# Patient Record
Sex: Female | Born: 1960
Health system: Southern US, Community
[De-identification: ages and names within clinical notes are randomized; demographics above are authoritative.]

## PROBLEM LIST (undated history)

## (undated) DIAGNOSIS — K760 Fatty (change of) liver, not elsewhere classified: Secondary | ICD-10-CM

## (undated) DIAGNOSIS — M25512 Pain in left shoulder: Secondary | ICD-10-CM

## (undated) DIAGNOSIS — E119 Type 2 diabetes mellitus without complications: Secondary | ICD-10-CM

## (undated) DIAGNOSIS — M1990 Unspecified osteoarthritis, unspecified site: Secondary | ICD-10-CM

## (undated) DIAGNOSIS — I10 Essential (primary) hypertension: Secondary | ICD-10-CM

## (undated) DIAGNOSIS — K219 Gastro-esophageal reflux disease without esophagitis: Secondary | ICD-10-CM

## (undated) DIAGNOSIS — M549 Dorsalgia, unspecified: Secondary | ICD-10-CM

## (undated) DIAGNOSIS — R011 Cardiac murmur, unspecified: Secondary | ICD-10-CM

## (undated) DIAGNOSIS — IMO0001 Reserved for inherently not codable concepts without codable children: Secondary | ICD-10-CM

## (undated) DIAGNOSIS — T7840XA Allergy, unspecified, initial encounter: Secondary | ICD-10-CM

## (undated) DIAGNOSIS — R51 Headache: Secondary | ICD-10-CM

## (undated) DIAGNOSIS — F419 Anxiety disorder, unspecified: Secondary | ICD-10-CM

## (undated) DIAGNOSIS — I499 Cardiac arrhythmia, unspecified: Secondary | ICD-10-CM

## (undated) DIAGNOSIS — M546 Pain in thoracic spine: Secondary | ICD-10-CM

## (undated) DIAGNOSIS — F329 Major depressive disorder, single episode, unspecified: Secondary | ICD-10-CM

## (undated) DIAGNOSIS — R000 Tachycardia, unspecified: Secondary | ICD-10-CM

## (undated) DIAGNOSIS — R519 Headache, unspecified: Secondary | ICD-10-CM

## (undated) DIAGNOSIS — D649 Anemia, unspecified: Secondary | ICD-10-CM

## (undated) DIAGNOSIS — F32A Depression, unspecified: Secondary | ICD-10-CM

## (undated) DIAGNOSIS — I4891 Unspecified atrial fibrillation: Secondary | ICD-10-CM

## (undated) HISTORY — DX: Pain in left shoulder: M25.512

## (undated) HISTORY — PX: TUBAL LIGATION: SHX77

## (undated) HISTORY — DX: Anemia, unspecified: D64.9

## (undated) HISTORY — DX: Allergy, unspecified, initial encounter: T78.40XA

## (undated) HISTORY — PX: ANKLE SURGERY: SHX546

## (undated) HISTORY — DX: Cardiac murmur, unspecified: R01.1

## (undated) HISTORY — PX: KNEE SURGERY: SHX244

## (undated) HISTORY — DX: Pain in thoracic spine: M54.6

---

## 1997-06-28 ENCOUNTER — Ambulatory Visit (HOSPITAL_COMMUNITY): Admission: RE | Admit: 1997-06-28 | Discharge: 1997-06-28 | Payer: Self-pay | Admitting: Obstetrics & Gynecology

## 1997-07-23 ENCOUNTER — Ambulatory Visit (HOSPITAL_COMMUNITY): Admission: RE | Admit: 1997-07-23 | Discharge: 1997-07-23 | Payer: Self-pay | Admitting: *Deleted

## 1997-08-18 ENCOUNTER — Inpatient Hospital Stay (HOSPITAL_COMMUNITY): Admission: AD | Admit: 1997-08-18 | Discharge: 1997-08-18 | Payer: Self-pay

## 1997-09-17 ENCOUNTER — Inpatient Hospital Stay (HOSPITAL_COMMUNITY): Admission: AD | Admit: 1997-09-17 | Discharge: 1997-09-17 | Payer: Self-pay | Admitting: *Deleted

## 1997-09-24 ENCOUNTER — Encounter: Admission: RE | Admit: 1997-09-24 | Discharge: 1997-12-23 | Payer: Self-pay

## 1997-09-26 ENCOUNTER — Inpatient Hospital Stay (HOSPITAL_COMMUNITY): Admission: AD | Admit: 1997-09-26 | Discharge: 1997-09-26 | Payer: Self-pay | Admitting: Obstetrics & Gynecology

## 1997-12-21 ENCOUNTER — Inpatient Hospital Stay (HOSPITAL_COMMUNITY): Admission: AD | Admit: 1997-12-21 | Discharge: 1997-12-23 | Payer: Self-pay | Admitting: *Deleted

## 1997-12-27 ENCOUNTER — Inpatient Hospital Stay (HOSPITAL_COMMUNITY): Admission: AD | Admit: 1997-12-27 | Discharge: 1997-12-27 | Payer: Self-pay | Admitting: Obstetrics

## 1998-01-28 ENCOUNTER — Other Ambulatory Visit: Admission: RE | Admit: 1998-01-28 | Discharge: 1998-01-28 | Payer: Self-pay

## 1998-01-28 ENCOUNTER — Encounter: Admission: RE | Admit: 1998-01-28 | Discharge: 1998-01-28 | Payer: Self-pay | Admitting: Family Medicine

## 1998-02-06 ENCOUNTER — Encounter: Admission: RE | Admit: 1998-02-06 | Discharge: 1998-02-06 | Payer: Self-pay | Admitting: Family Medicine

## 1998-02-13 ENCOUNTER — Encounter: Admission: RE | Admit: 1998-02-13 | Discharge: 1998-02-13 | Payer: Self-pay | Admitting: Family Medicine

## 1998-05-07 ENCOUNTER — Encounter: Admission: RE | Admit: 1998-05-07 | Discharge: 1998-05-07 | Payer: Self-pay | Admitting: Family Medicine

## 1998-06-10 ENCOUNTER — Encounter: Admission: RE | Admit: 1998-06-10 | Discharge: 1998-06-10 | Payer: Self-pay | Admitting: Sports Medicine

## 1998-06-26 ENCOUNTER — Encounter: Admission: RE | Admit: 1998-06-26 | Discharge: 1998-06-26 | Payer: Self-pay | Admitting: Family Medicine

## 1998-07-09 ENCOUNTER — Encounter: Admission: RE | Admit: 1998-07-09 | Discharge: 1998-07-09 | Payer: Self-pay | Admitting: Family Medicine

## 1998-07-17 ENCOUNTER — Encounter: Admission: RE | Admit: 1998-07-17 | Discharge: 1998-07-17 | Payer: Self-pay | Admitting: Family Medicine

## 1998-07-18 ENCOUNTER — Encounter: Admission: RE | Admit: 1998-07-18 | Discharge: 1998-07-18 | Payer: Self-pay | Admitting: Family Medicine

## 1998-07-31 ENCOUNTER — Encounter: Admission: RE | Admit: 1998-07-31 | Discharge: 1998-07-31 | Payer: Self-pay | Admitting: Family Medicine

## 1998-08-06 ENCOUNTER — Encounter: Admission: RE | Admit: 1998-08-06 | Discharge: 1998-08-06 | Payer: Self-pay | Admitting: Family Medicine

## 1998-08-14 ENCOUNTER — Encounter: Admission: RE | Admit: 1998-08-14 | Discharge: 1998-08-14 | Payer: Self-pay | Admitting: Family Medicine

## 1998-08-20 ENCOUNTER — Encounter: Admission: RE | Admit: 1998-08-20 | Discharge: 1998-08-20 | Payer: Self-pay | Admitting: Family Medicine

## 1998-09-09 ENCOUNTER — Encounter: Admission: RE | Admit: 1998-09-09 | Discharge: 1998-09-09 | Payer: Self-pay | Admitting: Sports Medicine

## 1998-09-18 ENCOUNTER — Encounter: Admission: RE | Admit: 1998-09-18 | Discharge: 1998-09-18 | Payer: Self-pay | Admitting: Family Medicine

## 1998-09-25 ENCOUNTER — Encounter: Admission: RE | Admit: 1998-09-25 | Discharge: 1998-09-25 | Payer: Self-pay | Admitting: Family Medicine

## 1998-09-26 ENCOUNTER — Encounter: Admission: RE | Admit: 1998-09-26 | Discharge: 1998-09-26 | Payer: Self-pay | Admitting: Family Medicine

## 1998-10-06 ENCOUNTER — Emergency Department (HOSPITAL_COMMUNITY): Admission: EM | Admit: 1998-10-06 | Discharge: 1998-10-06 | Payer: Self-pay | Admitting: Emergency Medicine

## 1998-10-08 ENCOUNTER — Encounter: Admission: RE | Admit: 1998-10-08 | Discharge: 1998-10-08 | Payer: Self-pay | Admitting: Family Medicine

## 1998-10-14 ENCOUNTER — Encounter: Admission: RE | Admit: 1998-10-14 | Discharge: 1998-10-14 | Payer: Self-pay | Admitting: Family Medicine

## 1998-10-16 ENCOUNTER — Encounter: Admission: RE | Admit: 1998-10-16 | Discharge: 1998-10-16 | Payer: Self-pay | Admitting: Sports Medicine

## 1998-10-22 ENCOUNTER — Encounter: Admission: RE | Admit: 1998-10-22 | Discharge: 1998-10-22 | Payer: Self-pay | Admitting: Family Medicine

## 1998-10-30 ENCOUNTER — Encounter: Admission: RE | Admit: 1998-10-30 | Discharge: 1998-10-30 | Payer: Self-pay | Admitting: Family Medicine

## 1998-11-05 ENCOUNTER — Encounter: Admission: RE | Admit: 1998-11-05 | Discharge: 1998-11-05 | Payer: Self-pay | Admitting: Family Medicine

## 1998-11-11 ENCOUNTER — Encounter: Admission: RE | Admit: 1998-11-11 | Discharge: 1998-11-11 | Payer: Self-pay | Admitting: Sports Medicine

## 1998-11-24 ENCOUNTER — Encounter: Admission: RE | Admit: 1998-11-24 | Discharge: 1998-11-24 | Payer: Self-pay | Admitting: Family Medicine

## 1998-11-28 ENCOUNTER — Encounter: Admission: RE | Admit: 1998-11-28 | Discharge: 1998-11-28 | Payer: Self-pay | Admitting: Family Medicine

## 1998-12-01 ENCOUNTER — Encounter: Admission: RE | Admit: 1998-12-01 | Discharge: 1998-12-01 | Payer: Self-pay | Admitting: Family Medicine

## 1999-01-05 ENCOUNTER — Encounter: Admission: RE | Admit: 1999-01-05 | Discharge: 1999-01-05 | Payer: Self-pay | Admitting: Family Medicine

## 1999-02-13 ENCOUNTER — Encounter: Payer: Self-pay | Admitting: *Deleted

## 1999-02-13 ENCOUNTER — Encounter: Admission: RE | Admit: 1999-02-13 | Discharge: 1999-02-13 | Payer: Self-pay | Admitting: *Deleted

## 1999-02-13 ENCOUNTER — Encounter: Admission: RE | Admit: 1999-02-13 | Discharge: 1999-02-13 | Payer: Self-pay | Admitting: Sports Medicine

## 1999-02-18 ENCOUNTER — Encounter: Admission: RE | Admit: 1999-02-18 | Discharge: 1999-02-18 | Payer: Self-pay | Admitting: Family Medicine

## 1999-02-19 ENCOUNTER — Encounter: Admission: RE | Admit: 1999-02-19 | Discharge: 1999-05-20 | Payer: Self-pay | Admitting: Orthopedic Surgery

## 1999-03-10 ENCOUNTER — Emergency Department (HOSPITAL_COMMUNITY): Admission: EM | Admit: 1999-03-10 | Discharge: 1999-03-10 | Payer: Self-pay | Admitting: Emergency Medicine

## 1999-03-24 ENCOUNTER — Encounter: Admission: RE | Admit: 1999-03-24 | Discharge: 1999-03-24 | Payer: Self-pay | Admitting: Family Medicine

## 1999-04-07 ENCOUNTER — Encounter: Admission: RE | Admit: 1999-04-07 | Discharge: 1999-04-07 | Payer: Self-pay | Admitting: Sports Medicine

## 1999-04-21 ENCOUNTER — Encounter: Admission: RE | Admit: 1999-04-21 | Discharge: 1999-04-21 | Payer: Self-pay | Admitting: Family Medicine

## 1999-04-24 ENCOUNTER — Encounter: Admission: RE | Admit: 1999-04-24 | Discharge: 1999-04-24 | Payer: Self-pay | Admitting: Family Medicine

## 1999-05-05 ENCOUNTER — Encounter: Admission: RE | Admit: 1999-05-05 | Discharge: 1999-05-05 | Payer: Self-pay | Admitting: Sports Medicine

## 1999-05-15 ENCOUNTER — Ambulatory Visit (HOSPITAL_COMMUNITY): Admission: RE | Admit: 1999-05-15 | Discharge: 1999-05-15 | Payer: Self-pay | Admitting: Family Medicine

## 1999-05-15 ENCOUNTER — Encounter: Admission: RE | Admit: 1999-05-15 | Discharge: 1999-05-15 | Payer: Self-pay | Admitting: Family Medicine

## 1999-06-09 ENCOUNTER — Encounter: Admission: RE | Admit: 1999-06-09 | Discharge: 1999-06-09 | Payer: Self-pay | Admitting: Family Medicine

## 1999-06-09 ENCOUNTER — Encounter: Payer: Self-pay | Admitting: Family Medicine

## 1999-06-09 ENCOUNTER — Ambulatory Visit (HOSPITAL_COMMUNITY): Admission: RE | Admit: 1999-06-09 | Discharge: 1999-06-09 | Payer: Self-pay | Admitting: Family Medicine

## 1999-06-24 ENCOUNTER — Encounter: Admission: RE | Admit: 1999-06-24 | Discharge: 1999-06-24 | Payer: Self-pay | Admitting: Family Medicine

## 1999-07-30 ENCOUNTER — Encounter: Admission: RE | Admit: 1999-07-30 | Discharge: 1999-07-30 | Payer: Self-pay | Admitting: Family Medicine

## 1999-09-15 ENCOUNTER — Encounter: Payer: Self-pay | Admitting: Emergency Medicine

## 1999-09-15 ENCOUNTER — Emergency Department (HOSPITAL_COMMUNITY): Admission: EM | Admit: 1999-09-15 | Discharge: 1999-09-16 | Payer: Self-pay | Admitting: Emergency Medicine

## 1999-09-16 ENCOUNTER — Encounter: Payer: Self-pay | Admitting: Cardiovascular Disease

## 1999-09-16 ENCOUNTER — Ambulatory Visit (HOSPITAL_COMMUNITY): Admission: RE | Admit: 1999-09-16 | Discharge: 1999-09-16 | Payer: Self-pay | Admitting: Cardiovascular Disease

## 1999-11-05 ENCOUNTER — Encounter: Admission: RE | Admit: 1999-11-05 | Discharge: 1999-11-05 | Payer: Self-pay | Admitting: Family Medicine

## 2000-02-26 ENCOUNTER — Encounter: Admission: RE | Admit: 2000-02-26 | Discharge: 2000-02-26 | Payer: Self-pay | Admitting: Family Medicine

## 2000-03-30 ENCOUNTER — Encounter: Admission: RE | Admit: 2000-03-30 | Discharge: 2000-03-30 | Payer: Self-pay | Admitting: Family Medicine

## 2000-04-08 ENCOUNTER — Encounter: Admission: RE | Admit: 2000-04-08 | Discharge: 2000-04-08 | Payer: Self-pay | Admitting: Family Medicine

## 2000-05-18 ENCOUNTER — Encounter: Admission: RE | Admit: 2000-05-18 | Discharge: 2000-05-18 | Payer: Self-pay | Admitting: Family Medicine

## 2000-05-19 ENCOUNTER — Encounter: Payer: Self-pay | Admitting: Sports Medicine

## 2000-05-19 ENCOUNTER — Encounter: Payer: Self-pay | Admitting: General Surgery

## 2000-05-19 ENCOUNTER — Ambulatory Visit (HOSPITAL_COMMUNITY): Admission: RE | Admit: 2000-05-19 | Discharge: 2000-05-19 | Payer: Self-pay | Admitting: General Surgery

## 2000-05-19 ENCOUNTER — Encounter: Admission: RE | Admit: 2000-05-19 | Discharge: 2000-05-19 | Payer: Self-pay | Admitting: Sports Medicine

## 2000-06-02 ENCOUNTER — Encounter: Admission: RE | Admit: 2000-06-02 | Discharge: 2000-06-02 | Payer: Self-pay | Admitting: Family Medicine

## 2000-06-28 ENCOUNTER — Encounter: Payer: Self-pay | Admitting: Sports Medicine

## 2000-06-28 ENCOUNTER — Encounter: Admission: RE | Admit: 2000-06-28 | Discharge: 2000-06-28 | Payer: Self-pay | Admitting: Sports Medicine

## 2000-06-28 ENCOUNTER — Encounter: Admission: RE | Admit: 2000-06-28 | Discharge: 2000-09-26 | Payer: Self-pay | Admitting: *Deleted

## 2000-08-10 ENCOUNTER — Encounter: Admission: RE | Admit: 2000-08-10 | Discharge: 2000-08-10 | Payer: Self-pay | Admitting: Family Medicine

## 2000-08-21 ENCOUNTER — Emergency Department (HOSPITAL_COMMUNITY): Admission: EM | Admit: 2000-08-21 | Discharge: 2000-08-21 | Payer: Self-pay | Admitting: Emergency Medicine

## 2000-08-25 ENCOUNTER — Encounter: Admission: RE | Admit: 2000-08-25 | Discharge: 2000-08-25 | Payer: Self-pay | Admitting: Family Medicine

## 2000-08-31 ENCOUNTER — Encounter: Payer: Self-pay | Admitting: Sports Medicine

## 2000-08-31 ENCOUNTER — Encounter: Admission: RE | Admit: 2000-08-31 | Discharge: 2000-08-31 | Payer: Self-pay | Admitting: Family Medicine

## 2000-08-31 ENCOUNTER — Encounter: Admission: RE | Admit: 2000-08-31 | Discharge: 2000-08-31 | Payer: Self-pay | Admitting: Sports Medicine

## 2000-09-28 ENCOUNTER — Encounter: Admission: RE | Admit: 2000-09-28 | Discharge: 2000-09-28 | Payer: Self-pay | Admitting: Family Medicine

## 2000-11-01 ENCOUNTER — Encounter: Admission: RE | Admit: 2000-11-01 | Discharge: 2000-11-01 | Payer: Self-pay | Admitting: Family Medicine

## 2000-11-06 ENCOUNTER — Emergency Department (HOSPITAL_COMMUNITY): Admission: EM | Admit: 2000-11-06 | Discharge: 2000-11-07 | Payer: Self-pay | Admitting: Emergency Medicine

## 2000-11-12 ENCOUNTER — Emergency Department (HOSPITAL_COMMUNITY): Admission: EM | Admit: 2000-11-12 | Discharge: 2000-11-12 | Payer: Self-pay | Admitting: Emergency Medicine

## 2000-11-15 ENCOUNTER — Other Ambulatory Visit: Admission: RE | Admit: 2000-11-15 | Discharge: 2000-11-15 | Payer: Self-pay | Admitting: Family Medicine

## 2000-11-15 ENCOUNTER — Encounter: Admission: RE | Admit: 2000-11-15 | Discharge: 2000-11-15 | Payer: Self-pay | Admitting: Family Medicine

## 2000-11-24 ENCOUNTER — Ambulatory Visit (HOSPITAL_COMMUNITY): Admission: RE | Admit: 2000-11-24 | Discharge: 2000-11-24 | Payer: Self-pay | Admitting: Gastroenterology

## 2000-11-24 ENCOUNTER — Encounter: Payer: Self-pay | Admitting: Gastroenterology

## 2000-12-15 ENCOUNTER — Encounter: Admission: RE | Admit: 2000-12-15 | Discharge: 2000-12-15 | Payer: Self-pay | Admitting: Family Medicine

## 2000-12-30 ENCOUNTER — Encounter: Admission: RE | Admit: 2000-12-30 | Discharge: 2000-12-30 | Payer: Self-pay | Admitting: Family Medicine

## 2001-02-01 ENCOUNTER — Encounter: Payer: Self-pay | Admitting: Sports Medicine

## 2001-02-01 ENCOUNTER — Encounter: Admission: RE | Admit: 2001-02-01 | Discharge: 2001-02-01 | Payer: Self-pay | Admitting: Sports Medicine

## 2001-03-14 ENCOUNTER — Encounter: Admission: RE | Admit: 2001-03-14 | Discharge: 2001-03-14 | Payer: Self-pay | Admitting: Family Medicine

## 2001-03-27 ENCOUNTER — Encounter: Admission: RE | Admit: 2001-03-27 | Discharge: 2001-03-27 | Payer: Self-pay | Admitting: Family Medicine

## 2001-03-30 ENCOUNTER — Encounter: Admission: RE | Admit: 2001-03-30 | Discharge: 2001-03-30 | Payer: Self-pay | Admitting: Family Medicine

## 2001-04-04 ENCOUNTER — Encounter: Admission: RE | Admit: 2001-04-04 | Discharge: 2001-04-04 | Payer: Self-pay | Admitting: Family Medicine

## 2001-04-21 ENCOUNTER — Encounter: Admission: RE | Admit: 2001-04-21 | Discharge: 2001-04-21 | Payer: Self-pay | Admitting: Family Medicine

## 2001-05-04 ENCOUNTER — Encounter: Admission: RE | Admit: 2001-05-04 | Discharge: 2001-05-04 | Payer: Self-pay | Admitting: Family Medicine

## 2001-05-17 ENCOUNTER — Encounter: Admission: RE | Admit: 2001-05-17 | Discharge: 2001-05-17 | Payer: Self-pay | Admitting: *Deleted

## 2001-05-24 ENCOUNTER — Encounter: Admission: RE | Admit: 2001-05-24 | Discharge: 2001-05-24 | Payer: Self-pay | Admitting: Family Medicine

## 2001-06-01 ENCOUNTER — Encounter: Admission: RE | Admit: 2001-06-01 | Discharge: 2001-07-27 | Payer: Self-pay | Admitting: Sports Medicine

## 2001-06-12 ENCOUNTER — Encounter: Admission: RE | Admit: 2001-06-12 | Discharge: 2001-06-12 | Payer: Self-pay | Admitting: Family Medicine

## 2001-06-28 ENCOUNTER — Encounter: Admission: RE | Admit: 2001-06-28 | Discharge: 2001-06-28 | Payer: Self-pay | Admitting: Family Medicine

## 2001-07-04 ENCOUNTER — Encounter: Admission: RE | Admit: 2001-07-04 | Discharge: 2001-07-04 | Payer: Self-pay | Admitting: Family Medicine

## 2001-07-21 ENCOUNTER — Encounter: Payer: Self-pay | Admitting: Family Medicine

## 2001-07-21 ENCOUNTER — Encounter: Admission: RE | Admit: 2001-07-21 | Discharge: 2001-07-21 | Payer: Self-pay | Admitting: Family Medicine

## 2001-07-25 ENCOUNTER — Encounter: Payer: Self-pay | Admitting: *Deleted

## 2001-07-25 ENCOUNTER — Encounter: Admission: RE | Admit: 2001-07-25 | Discharge: 2001-07-25 | Payer: Self-pay | Admitting: *Deleted

## 2001-08-02 ENCOUNTER — Encounter: Admission: RE | Admit: 2001-08-02 | Discharge: 2001-08-02 | Payer: Self-pay | Admitting: Family Medicine

## 2001-08-29 ENCOUNTER — Encounter: Admission: RE | Admit: 2001-08-29 | Discharge: 2001-08-29 | Payer: Self-pay | Admitting: Sports Medicine

## 2001-10-06 ENCOUNTER — Encounter: Admission: RE | Admit: 2001-10-06 | Discharge: 2001-10-06 | Payer: Self-pay | Admitting: Family Medicine

## 2001-11-03 ENCOUNTER — Encounter: Admission: RE | Admit: 2001-11-03 | Discharge: 2001-11-03 | Payer: Self-pay | Admitting: Family Medicine

## 2001-11-09 ENCOUNTER — Encounter: Admission: RE | Admit: 2001-11-09 | Discharge: 2001-11-09 | Payer: Self-pay | Admitting: Sports Medicine

## 2001-12-05 ENCOUNTER — Encounter: Admission: RE | Admit: 2001-12-05 | Discharge: 2001-12-05 | Payer: Self-pay | Admitting: Family Medicine

## 2001-12-19 ENCOUNTER — Encounter: Admission: RE | Admit: 2001-12-19 | Discharge: 2001-12-19 | Payer: Self-pay | Admitting: Family Medicine

## 2001-12-26 ENCOUNTER — Encounter: Admission: RE | Admit: 2001-12-26 | Discharge: 2001-12-26 | Payer: Self-pay | Admitting: Family Medicine

## 2002-01-18 ENCOUNTER — Encounter: Admission: RE | Admit: 2002-01-18 | Discharge: 2002-01-18 | Payer: Self-pay | Admitting: Family Medicine

## 2002-02-08 ENCOUNTER — Encounter: Admission: RE | Admit: 2002-02-08 | Discharge: 2002-02-08 | Payer: Self-pay | Admitting: Family Medicine

## 2002-02-08 ENCOUNTER — Encounter: Payer: Self-pay | Admitting: Family Medicine

## 2002-03-09 ENCOUNTER — Encounter: Admission: RE | Admit: 2002-03-09 | Discharge: 2002-03-09 | Payer: Self-pay | Admitting: Family Medicine

## 2002-03-28 ENCOUNTER — Encounter: Admission: RE | Admit: 2002-03-28 | Discharge: 2002-03-28 | Payer: Self-pay | Admitting: Family Medicine

## 2002-03-30 ENCOUNTER — Encounter: Admission: RE | Admit: 2002-03-30 | Discharge: 2002-03-30 | Payer: Self-pay | Admitting: Sports Medicine

## 2002-05-01 ENCOUNTER — Emergency Department (HOSPITAL_COMMUNITY): Admission: EM | Admit: 2002-05-01 | Discharge: 2002-05-01 | Payer: Self-pay | Admitting: Emergency Medicine

## 2002-05-02 ENCOUNTER — Encounter: Admission: RE | Admit: 2002-05-02 | Discharge: 2002-05-02 | Payer: Self-pay | Admitting: Family Medicine

## 2002-05-03 ENCOUNTER — Encounter: Admission: RE | Admit: 2002-05-03 | Discharge: 2002-05-03 | Payer: Self-pay | Admitting: Family Medicine

## 2002-05-11 ENCOUNTER — Encounter: Admission: RE | Admit: 2002-05-11 | Discharge: 2002-08-09 | Payer: Self-pay | Admitting: Sports Medicine

## 2002-05-11 ENCOUNTER — Encounter: Admission: RE | Admit: 2002-05-11 | Discharge: 2002-05-11 | Payer: Self-pay | Admitting: Sports Medicine

## 2002-05-17 ENCOUNTER — Encounter: Admission: RE | Admit: 2002-05-17 | Discharge: 2002-05-17 | Payer: Self-pay | Admitting: Family Medicine

## 2002-05-24 ENCOUNTER — Encounter: Admission: RE | Admit: 2002-05-24 | Discharge: 2002-05-24 | Payer: Self-pay | Admitting: Family Medicine

## 2002-05-31 ENCOUNTER — Encounter: Admission: RE | Admit: 2002-05-31 | Discharge: 2002-05-31 | Payer: Self-pay | Admitting: Family Medicine

## 2002-06-28 ENCOUNTER — Encounter: Admission: RE | Admit: 2002-06-28 | Discharge: 2002-06-28 | Payer: Self-pay | Admitting: Family Medicine

## 2002-06-29 ENCOUNTER — Encounter: Admission: RE | Admit: 2002-06-29 | Discharge: 2002-06-29 | Payer: Self-pay | Admitting: Family Medicine

## 2002-07-13 ENCOUNTER — Encounter: Admission: RE | Admit: 2002-07-13 | Discharge: 2002-07-13 | Payer: Self-pay | Admitting: Family Medicine

## 2002-07-20 ENCOUNTER — Encounter: Admission: RE | Admit: 2002-07-20 | Discharge: 2002-07-20 | Payer: Self-pay | Admitting: Family Medicine

## 2002-07-26 ENCOUNTER — Encounter: Payer: Self-pay | Admitting: Gastroenterology

## 2002-07-26 ENCOUNTER — Ambulatory Visit (HOSPITAL_COMMUNITY): Admission: RE | Admit: 2002-07-26 | Discharge: 2002-07-26 | Payer: Self-pay | Admitting: Gastroenterology

## 2002-08-06 ENCOUNTER — Encounter: Admission: RE | Admit: 2002-08-06 | Discharge: 2002-08-06 | Payer: Self-pay | Admitting: Family Medicine

## 2002-08-09 ENCOUNTER — Ambulatory Visit (HOSPITAL_COMMUNITY): Admission: RE | Admit: 2002-08-09 | Discharge: 2002-08-09 | Payer: Self-pay | Admitting: Gastroenterology

## 2002-08-09 ENCOUNTER — Encounter: Payer: Self-pay | Admitting: Gastroenterology

## 2002-08-23 ENCOUNTER — Encounter: Admission: RE | Admit: 2002-08-23 | Discharge: 2002-08-23 | Payer: Self-pay | Admitting: Family Medicine

## 2002-08-25 ENCOUNTER — Emergency Department (HOSPITAL_COMMUNITY): Admission: EM | Admit: 2002-08-25 | Discharge: 2002-08-25 | Payer: Self-pay | Admitting: Emergency Medicine

## 2002-08-27 ENCOUNTER — Encounter: Admission: RE | Admit: 2002-08-27 | Discharge: 2002-08-27 | Payer: Self-pay | Admitting: Family Medicine

## 2002-09-04 ENCOUNTER — Encounter: Admission: RE | Admit: 2002-09-04 | Discharge: 2002-09-04 | Payer: Self-pay | Admitting: Family Medicine

## 2002-09-21 ENCOUNTER — Encounter: Admission: RE | Admit: 2002-09-21 | Discharge: 2002-09-21 | Payer: Self-pay | Admitting: Sports Medicine

## 2002-10-25 ENCOUNTER — Encounter: Admission: RE | Admit: 2002-10-25 | Discharge: 2002-10-25 | Payer: Self-pay | Admitting: Family Medicine

## 2002-11-08 ENCOUNTER — Encounter: Admission: RE | Admit: 2002-11-08 | Discharge: 2002-11-08 | Payer: Self-pay | Admitting: Family Medicine

## 2002-11-13 ENCOUNTER — Encounter: Admission: RE | Admit: 2002-11-13 | Discharge: 2002-11-13 | Payer: Self-pay | Admitting: Family Medicine

## 2002-11-14 ENCOUNTER — Encounter: Admission: RE | Admit: 2002-11-14 | Discharge: 2002-11-14 | Payer: Self-pay | Admitting: Family Medicine

## 2003-01-10 ENCOUNTER — Encounter: Admission: RE | Admit: 2003-01-10 | Discharge: 2003-01-10 | Payer: Self-pay | Admitting: Family Medicine

## 2003-01-17 ENCOUNTER — Encounter: Admission: RE | Admit: 2003-01-17 | Discharge: 2003-01-17 | Payer: Self-pay | Admitting: Sports Medicine

## 2003-02-01 ENCOUNTER — Encounter: Admission: RE | Admit: 2003-02-01 | Discharge: 2003-02-01 | Payer: Self-pay | Admitting: Sports Medicine

## 2003-02-01 ENCOUNTER — Other Ambulatory Visit: Admission: RE | Admit: 2003-02-01 | Discharge: 2003-02-01 | Payer: Self-pay | Admitting: Family Medicine

## 2003-03-06 ENCOUNTER — Encounter: Admission: RE | Admit: 2003-03-06 | Discharge: 2003-03-06 | Payer: Self-pay | Admitting: Sports Medicine

## 2003-04-18 ENCOUNTER — Encounter: Admission: RE | Admit: 2003-04-18 | Discharge: 2003-04-18 | Payer: Self-pay | Admitting: Family Medicine

## 2003-05-06 ENCOUNTER — Encounter: Admission: RE | Admit: 2003-05-06 | Discharge: 2003-05-06 | Payer: Self-pay | Admitting: Family Medicine

## 2003-05-08 ENCOUNTER — Ambulatory Visit (HOSPITAL_COMMUNITY): Admission: RE | Admit: 2003-05-08 | Discharge: 2003-05-08 | Payer: Self-pay | Admitting: Gastroenterology

## 2003-05-16 ENCOUNTER — Encounter: Admission: RE | Admit: 2003-05-16 | Discharge: 2003-05-16 | Payer: Self-pay | Admitting: Family Medicine

## 2003-06-13 ENCOUNTER — Encounter: Admission: RE | Admit: 2003-06-13 | Discharge: 2003-06-13 | Payer: Self-pay | Admitting: Family Medicine

## 2003-06-26 ENCOUNTER — Encounter: Admission: RE | Admit: 2003-06-26 | Discharge: 2003-06-26 | Payer: Self-pay | Admitting: Sports Medicine

## 2003-06-30 ENCOUNTER — Emergency Department (HOSPITAL_COMMUNITY): Admission: EM | Admit: 2003-06-30 | Discharge: 2003-06-30 | Payer: Self-pay | Admitting: Emergency Medicine

## 2003-09-02 ENCOUNTER — Encounter: Admission: RE | Admit: 2003-09-02 | Discharge: 2003-09-02 | Payer: Self-pay | Admitting: Family Medicine

## 2003-09-11 ENCOUNTER — Encounter: Admission: RE | Admit: 2003-09-11 | Discharge: 2003-09-11 | Payer: Self-pay | Admitting: Family Medicine

## 2003-10-29 ENCOUNTER — Encounter: Admission: RE | Admit: 2003-10-29 | Discharge: 2003-10-29 | Payer: Self-pay | Admitting: Family Medicine

## 2003-11-04 ENCOUNTER — Encounter: Admission: RE | Admit: 2003-11-04 | Discharge: 2003-11-04 | Payer: Self-pay | Admitting: Family Medicine

## 2003-11-13 ENCOUNTER — Encounter: Admission: RE | Admit: 2003-11-13 | Discharge: 2003-11-13 | Payer: Self-pay | Admitting: Sports Medicine

## 2003-11-13 ENCOUNTER — Encounter: Admission: RE | Admit: 2003-11-13 | Discharge: 2003-11-13 | Payer: Self-pay | Admitting: Family Medicine

## 2003-12-13 ENCOUNTER — Encounter: Admission: RE | Admit: 2003-12-13 | Discharge: 2003-12-13 | Payer: Self-pay | Admitting: Family Medicine

## 2003-12-31 ENCOUNTER — Ambulatory Visit: Payer: Self-pay | Admitting: Family Medicine

## 2004-01-01 ENCOUNTER — Ambulatory Visit (HOSPITAL_COMMUNITY): Admission: RE | Admit: 2004-01-01 | Discharge: 2004-01-01 | Payer: Self-pay | Admitting: Family Medicine

## 2004-01-13 ENCOUNTER — Ambulatory Visit: Payer: Self-pay | Admitting: Family Medicine

## 2004-01-29 ENCOUNTER — Ambulatory Visit: Payer: Self-pay | Admitting: Family Medicine

## 2004-01-30 ENCOUNTER — Ambulatory Visit: Payer: Self-pay | Admitting: Family Medicine

## 2004-02-05 ENCOUNTER — Encounter
Admission: RE | Admit: 2004-02-05 | Discharge: 2004-05-05 | Payer: Self-pay | Admitting: Physical Medicine & Rehabilitation

## 2004-02-21 ENCOUNTER — Ambulatory Visit: Payer: Self-pay | Admitting: Physical Medicine & Rehabilitation

## 2004-03-24 ENCOUNTER — Ambulatory Visit (HOSPITAL_COMMUNITY): Admission: RE | Admit: 2004-03-24 | Discharge: 2004-03-24 | Payer: Self-pay | Admitting: Family Medicine

## 2004-03-24 ENCOUNTER — Ambulatory Visit: Payer: Self-pay | Admitting: Family Medicine

## 2004-04-03 ENCOUNTER — Encounter: Admission: RE | Admit: 2004-04-03 | Discharge: 2004-04-21 | Payer: Self-pay | Admitting: Family Medicine

## 2004-04-09 ENCOUNTER — Ambulatory Visit: Payer: Self-pay | Admitting: Sports Medicine

## 2004-04-28 ENCOUNTER — Ambulatory Visit: Payer: Self-pay | Admitting: Family Medicine

## 2004-04-30 ENCOUNTER — Ambulatory Visit: Payer: Self-pay | Admitting: Family Medicine

## 2004-05-05 ENCOUNTER — Encounter: Admission: RE | Admit: 2004-05-05 | Discharge: 2004-06-24 | Payer: Self-pay | Admitting: Family Medicine

## 2004-05-13 ENCOUNTER — Ambulatory Visit: Payer: Self-pay | Admitting: Family Medicine

## 2004-06-17 ENCOUNTER — Encounter (INDEPENDENT_AMBULATORY_CARE_PROVIDER_SITE_OTHER): Payer: Self-pay | Admitting: *Deleted

## 2004-06-17 ENCOUNTER — Ambulatory Visit: Payer: Self-pay

## 2004-07-15 ENCOUNTER — Ambulatory Visit: Payer: Self-pay | Admitting: Family Medicine

## 2004-08-14 ENCOUNTER — Ambulatory Visit: Payer: Self-pay | Admitting: Family Medicine

## 2004-08-18 ENCOUNTER — Encounter: Admission: RE | Admit: 2004-08-18 | Discharge: 2004-08-18 | Payer: Self-pay | Admitting: Sports Medicine

## 2004-09-25 ENCOUNTER — Ambulatory Visit: Payer: Self-pay | Admitting: Family Medicine

## 2004-10-12 ENCOUNTER — Ambulatory Visit: Payer: Self-pay | Admitting: Sports Medicine

## 2004-10-19 ENCOUNTER — Ambulatory Visit: Payer: Self-pay | Admitting: Family Medicine

## 2004-10-27 ENCOUNTER — Ambulatory Visit: Payer: Self-pay | Admitting: Family Medicine

## 2004-11-12 ENCOUNTER — Ambulatory Visit: Payer: Self-pay | Admitting: Family Medicine

## 2004-11-13 ENCOUNTER — Ambulatory Visit: Payer: Self-pay | Admitting: Family Medicine

## 2004-11-16 ENCOUNTER — Ambulatory Visit: Payer: Self-pay | Admitting: Sports Medicine

## 2004-11-23 ENCOUNTER — Ambulatory Visit: Payer: Self-pay | Admitting: Family Medicine

## 2004-11-25 ENCOUNTER — Encounter: Admission: RE | Admit: 2004-11-25 | Discharge: 2005-01-08 | Payer: Self-pay | Admitting: Sports Medicine

## 2005-01-26 ENCOUNTER — Ambulatory Visit: Payer: Self-pay | Admitting: Sports Medicine

## 2005-02-18 ENCOUNTER — Ambulatory Visit: Payer: Self-pay | Admitting: Family Medicine

## 2005-03-05 ENCOUNTER — Encounter: Admission: RE | Admit: 2005-03-05 | Discharge: 2005-03-05 | Payer: Self-pay | Admitting: Sports Medicine

## 2005-03-15 ENCOUNTER — Ambulatory Visit: Payer: Self-pay | Admitting: Family Medicine

## 2005-04-30 ENCOUNTER — Ambulatory Visit: Payer: Self-pay | Admitting: Sports Medicine

## 2005-05-01 ENCOUNTER — Inpatient Hospital Stay (HOSPITAL_COMMUNITY): Admission: EM | Admit: 2005-05-01 | Discharge: 2005-05-03 | Payer: Self-pay | Admitting: Emergency Medicine

## 2005-05-06 ENCOUNTER — Encounter: Admission: RE | Admit: 2005-05-06 | Discharge: 2005-05-06 | Payer: Self-pay | Admitting: Family Medicine

## 2005-05-12 ENCOUNTER — Ambulatory Visit (HOSPITAL_COMMUNITY): Admission: RE | Admit: 2005-05-12 | Discharge: 2005-05-13 | Payer: Self-pay | Admitting: Orthopaedic Surgery

## 2005-06-01 ENCOUNTER — Ambulatory Visit: Payer: Self-pay | Admitting: Sports Medicine

## 2005-06-07 ENCOUNTER — Ambulatory Visit: Payer: Self-pay | Admitting: Family Medicine

## 2005-08-18 ENCOUNTER — Ambulatory Visit: Payer: Self-pay | Admitting: Family Medicine

## 2005-08-19 ENCOUNTER — Encounter: Admission: RE | Admit: 2005-08-19 | Discharge: 2005-08-19 | Payer: Self-pay | Admitting: Sports Medicine

## 2005-08-25 ENCOUNTER — Ambulatory Visit: Payer: Self-pay | Admitting: Sports Medicine

## 2005-09-03 ENCOUNTER — Encounter: Admission: RE | Admit: 2005-09-03 | Discharge: 2005-09-03 | Payer: Self-pay | Admitting: Family Medicine

## 2005-09-03 ENCOUNTER — Ambulatory Visit: Payer: Self-pay | Admitting: Family Medicine

## 2005-09-09 ENCOUNTER — Ambulatory Visit: Payer: Self-pay | Admitting: Sports Medicine

## 2005-09-15 ENCOUNTER — Ambulatory Visit: Payer: Self-pay | Admitting: Family Medicine

## 2005-11-10 ENCOUNTER — Ambulatory Visit: Payer: Self-pay | Admitting: Sports Medicine

## 2005-11-10 ENCOUNTER — Encounter (INDEPENDENT_AMBULATORY_CARE_PROVIDER_SITE_OTHER): Payer: Self-pay | Admitting: *Deleted

## 2005-12-18 ENCOUNTER — Emergency Department (HOSPITAL_COMMUNITY): Admission: EM | Admit: 2005-12-18 | Discharge: 2005-12-18 | Payer: Self-pay | Admitting: Emergency Medicine

## 2006-01-07 ENCOUNTER — Ambulatory Visit: Payer: Self-pay | Admitting: Family Medicine

## 2006-01-10 ENCOUNTER — Encounter: Admission: RE | Admit: 2006-01-10 | Discharge: 2006-01-10 | Payer: Self-pay | Admitting: Family Medicine

## 2006-01-29 ENCOUNTER — Observation Stay (HOSPITAL_COMMUNITY): Admission: EM | Admit: 2006-01-29 | Discharge: 2006-01-30 | Payer: Self-pay | Admitting: *Deleted

## 2006-01-31 ENCOUNTER — Ambulatory Visit: Payer: Self-pay | Admitting: Sports Medicine

## 2006-02-01 ENCOUNTER — Emergency Department (HOSPITAL_COMMUNITY): Admission: EM | Admit: 2006-02-01 | Discharge: 2006-02-01 | Payer: Self-pay | Admitting: Emergency Medicine

## 2006-02-02 ENCOUNTER — Emergency Department (HOSPITAL_COMMUNITY): Admission: EM | Admit: 2006-02-02 | Discharge: 2006-02-02 | Payer: Self-pay | Admitting: Emergency Medicine

## 2006-02-22 ENCOUNTER — Ambulatory Visit: Payer: Self-pay | Admitting: Family Medicine

## 2006-03-17 ENCOUNTER — Ambulatory Visit: Payer: Self-pay | Admitting: Family Medicine

## 2006-03-25 ENCOUNTER — Ambulatory Visit: Payer: Self-pay | Admitting: Family Medicine

## 2006-04-11 ENCOUNTER — Emergency Department (HOSPITAL_COMMUNITY): Admission: EM | Admit: 2006-04-11 | Discharge: 2006-04-11 | Payer: Self-pay | Admitting: Emergency Medicine

## 2006-04-15 ENCOUNTER — Ambulatory Visit: Payer: Self-pay | Admitting: Family Medicine

## 2006-04-29 ENCOUNTER — Ambulatory Visit: Payer: Self-pay | Admitting: Family Medicine

## 2006-06-09 ENCOUNTER — Ambulatory Visit: Payer: Self-pay | Admitting: Family Medicine

## 2006-06-09 ENCOUNTER — Encounter (INDEPENDENT_AMBULATORY_CARE_PROVIDER_SITE_OTHER): Payer: Self-pay | Admitting: Family Medicine

## 2006-06-09 LAB — CONVERTED CEMR LAB
Alkaline Phosphatase: 68 units/L (ref 39–117)
BUN: 14 mg/dL (ref 6–23)
Basophils Relative: 1 % (ref 0–1)
Calcium: 9.1 mg/dL (ref 8.4–10.5)
Eosinophils Absolute: 0.2 10*3/uL (ref 0.0–0.7)
Eosinophils Relative: 2 % (ref 0–5)
Hemoglobin: 10.5 g/dL — ABNORMAL LOW (ref 12.0–15.0)
Lymphocytes Relative: 40 % (ref 12–46)
MCV: 80.3 fL (ref 78.0–100.0)
Neutro Abs: 4 10*3/uL (ref 1.7–7.7)
Neutrophils Relative %: 49 % (ref 43–77)
Platelets: 269 10*3/uL (ref 150–400)
Potassium: 3.7 meq/L (ref 3.5–5.3)
RBC: 4.46 M/uL (ref 3.87–5.11)
RDW: 16.7 % — ABNORMAL HIGH (ref 11.5–14.0)

## 2006-06-10 ENCOUNTER — Encounter (INDEPENDENT_AMBULATORY_CARE_PROVIDER_SITE_OTHER): Payer: Self-pay | Admitting: Family Medicine

## 2006-06-16 DIAGNOSIS — I471 Supraventricular tachycardia: Secondary | ICD-10-CM | POA: Insufficient documentation

## 2006-06-16 DIAGNOSIS — I10 Essential (primary) hypertension: Secondary | ICD-10-CM | POA: Insufficient documentation

## 2006-06-16 DIAGNOSIS — IMO0002 Reserved for concepts with insufficient information to code with codable children: Secondary | ICD-10-CM | POA: Insufficient documentation

## 2006-06-16 DIAGNOSIS — E669 Obesity, unspecified: Secondary | ICD-10-CM | POA: Insufficient documentation

## 2006-06-16 DIAGNOSIS — F339 Major depressive disorder, recurrent, unspecified: Secondary | ICD-10-CM | POA: Insufficient documentation

## 2006-06-17 ENCOUNTER — Encounter (INDEPENDENT_AMBULATORY_CARE_PROVIDER_SITE_OTHER): Payer: Self-pay | Admitting: *Deleted

## 2006-06-21 ENCOUNTER — Ambulatory Visit: Payer: Self-pay | Admitting: Family Medicine

## 2006-06-21 ENCOUNTER — Encounter: Payer: Self-pay | Admitting: Family Medicine

## 2006-06-21 ENCOUNTER — Telehealth: Payer: Self-pay | Admitting: *Deleted

## 2006-06-30 ENCOUNTER — Telehealth: Payer: Self-pay | Admitting: *Deleted

## 2006-07-18 ENCOUNTER — Telehealth (INDEPENDENT_AMBULATORY_CARE_PROVIDER_SITE_OTHER): Payer: Self-pay | Admitting: Family Medicine

## 2006-07-19 ENCOUNTER — Telehealth (INDEPENDENT_AMBULATORY_CARE_PROVIDER_SITE_OTHER): Payer: Self-pay | Admitting: Family Medicine

## 2006-08-04 ENCOUNTER — Ambulatory Visit: Payer: Self-pay | Admitting: Family Medicine

## 2006-08-11 ENCOUNTER — Telehealth: Payer: Self-pay | Admitting: *Deleted

## 2006-09-02 ENCOUNTER — Telehealth (INDEPENDENT_AMBULATORY_CARE_PROVIDER_SITE_OTHER): Payer: Self-pay | Admitting: Family Medicine

## 2006-09-15 ENCOUNTER — Telehealth: Payer: Self-pay | Admitting: *Deleted

## 2006-09-19 ENCOUNTER — Ambulatory Visit: Payer: Self-pay | Admitting: Family Medicine

## 2006-09-21 ENCOUNTER — Telehealth (INDEPENDENT_AMBULATORY_CARE_PROVIDER_SITE_OTHER): Payer: Self-pay | Admitting: Family Medicine

## 2006-10-05 ENCOUNTER — Telehealth (INDEPENDENT_AMBULATORY_CARE_PROVIDER_SITE_OTHER): Payer: Self-pay | Admitting: Family Medicine

## 2006-10-19 ENCOUNTER — Telehealth (INDEPENDENT_AMBULATORY_CARE_PROVIDER_SITE_OTHER): Payer: Self-pay | Admitting: Family Medicine

## 2006-10-20 ENCOUNTER — Telehealth: Payer: Self-pay | Admitting: *Deleted

## 2006-10-20 ENCOUNTER — Ambulatory Visit: Payer: Self-pay | Admitting: Family Medicine

## 2006-10-26 ENCOUNTER — Telehealth: Payer: Self-pay | Admitting: *Deleted

## 2006-11-03 ENCOUNTER — Telehealth (INDEPENDENT_AMBULATORY_CARE_PROVIDER_SITE_OTHER): Payer: Self-pay | Admitting: Family Medicine

## 2006-12-07 ENCOUNTER — Encounter (INDEPENDENT_AMBULATORY_CARE_PROVIDER_SITE_OTHER): Payer: Self-pay | Admitting: *Deleted

## 2006-12-13 ENCOUNTER — Ambulatory Visit: Payer: Self-pay | Admitting: Family Medicine

## 2006-12-20 ENCOUNTER — Ambulatory Visit: Payer: Self-pay | Admitting: Internal Medicine

## 2006-12-23 ENCOUNTER — Ambulatory Visit (HOSPITAL_COMMUNITY): Admission: RE | Admit: 2006-12-23 | Discharge: 2006-12-23 | Payer: Self-pay | Admitting: Family Medicine

## 2006-12-23 ENCOUNTER — Telehealth (INDEPENDENT_AMBULATORY_CARE_PROVIDER_SITE_OTHER): Payer: Self-pay | Admitting: Family Medicine

## 2006-12-23 ENCOUNTER — Ambulatory Visit: Payer: Self-pay | Admitting: Family Medicine

## 2006-12-26 ENCOUNTER — Encounter (INDEPENDENT_AMBULATORY_CARE_PROVIDER_SITE_OTHER): Payer: Self-pay | Admitting: Family Medicine

## 2006-12-26 ENCOUNTER — Telehealth (INDEPENDENT_AMBULATORY_CARE_PROVIDER_SITE_OTHER): Payer: Self-pay | Admitting: Family Medicine

## 2006-12-27 ENCOUNTER — Ambulatory Visit: Payer: Self-pay | Admitting: Family Medicine

## 2006-12-27 ENCOUNTER — Encounter (INDEPENDENT_AMBULATORY_CARE_PROVIDER_SITE_OTHER): Payer: Self-pay | Admitting: Family Medicine

## 2006-12-27 LAB — CONVERTED CEMR LAB
BUN: 14 mg/dL (ref 6–23)
CO2: 24 meq/L (ref 19–32)
Chloride: 102 meq/L (ref 96–112)
Creatinine, Ser: 0.84 mg/dL (ref 0.40–1.20)
Direct LDL: 132 mg/dL — ABNORMAL HIGH
Glucose, Bld: 111 mg/dL — ABNORMAL HIGH (ref 70–99)
MCHC: 30.1 g/dL (ref 30.0–36.0)
RBC: 4.24 M/uL (ref 3.87–5.11)
RDW: 16.3 % — ABNORMAL HIGH (ref 11.5–14.0)
TSH: 1.027 microintl units/mL (ref 0.350–5.50)

## 2007-01-04 ENCOUNTER — Ambulatory Visit: Payer: Self-pay | Admitting: Family Medicine

## 2007-01-04 ENCOUNTER — Encounter (INDEPENDENT_AMBULATORY_CARE_PROVIDER_SITE_OTHER): Payer: Self-pay | Admitting: Family Medicine

## 2007-01-04 LAB — CONVERTED CEMR LAB
Beta hcg, urine, semiquantitative: NEGATIVE
Chlamydia, DNA Probe: NEGATIVE
FSH: 42.2 milliintl units/mL
Ferritin: 14 ng/mL (ref 10–291)

## 2007-01-05 ENCOUNTER — Encounter (INDEPENDENT_AMBULATORY_CARE_PROVIDER_SITE_OTHER): Payer: Self-pay | Admitting: Family Medicine

## 2007-01-05 LAB — CONVERTED CEMR LAB: Pap Smear: NORMAL

## 2007-01-06 ENCOUNTER — Encounter (INDEPENDENT_AMBULATORY_CARE_PROVIDER_SITE_OTHER): Payer: Self-pay | Admitting: Family Medicine

## 2007-01-17 ENCOUNTER — Ambulatory Visit: Payer: Self-pay | Admitting: Family Medicine

## 2007-01-24 ENCOUNTER — Encounter (INDEPENDENT_AMBULATORY_CARE_PROVIDER_SITE_OTHER): Payer: Self-pay | Admitting: Family Medicine

## 2007-02-07 ENCOUNTER — Ambulatory Visit: Payer: Self-pay | Admitting: Family Medicine

## 2007-02-08 ENCOUNTER — Telehealth (INDEPENDENT_AMBULATORY_CARE_PROVIDER_SITE_OTHER): Payer: Self-pay | Admitting: Family Medicine

## 2007-02-10 ENCOUNTER — Encounter (INDEPENDENT_AMBULATORY_CARE_PROVIDER_SITE_OTHER): Payer: Self-pay | Admitting: Family Medicine

## 2007-02-10 ENCOUNTER — Ambulatory Visit: Payer: Self-pay | Admitting: Family Medicine

## 2007-02-10 ENCOUNTER — Telehealth: Payer: Self-pay | Admitting: *Deleted

## 2007-02-20 ENCOUNTER — Telehealth (INDEPENDENT_AMBULATORY_CARE_PROVIDER_SITE_OTHER): Payer: Self-pay | Admitting: Family Medicine

## 2007-02-20 ENCOUNTER — Ambulatory Visit: Payer: Self-pay | Admitting: Family Medicine

## 2007-02-20 LAB — CONVERTED CEMR LAB

## 2007-03-10 ENCOUNTER — Telehealth (INDEPENDENT_AMBULATORY_CARE_PROVIDER_SITE_OTHER): Payer: Self-pay | Admitting: *Deleted

## 2007-03-15 ENCOUNTER — Telehealth (INDEPENDENT_AMBULATORY_CARE_PROVIDER_SITE_OTHER): Payer: Self-pay | Admitting: Family Medicine

## 2007-03-20 ENCOUNTER — Telehealth: Payer: Self-pay | Admitting: *Deleted

## 2007-03-21 ENCOUNTER — Encounter (INDEPENDENT_AMBULATORY_CARE_PROVIDER_SITE_OTHER): Payer: Self-pay | Admitting: Family Medicine

## 2007-03-21 ENCOUNTER — Ambulatory Visit: Payer: Self-pay | Admitting: Family Medicine

## 2007-03-21 DIAGNOSIS — B182 Chronic viral hepatitis C: Secondary | ICD-10-CM | POA: Insufficient documentation

## 2007-03-23 LAB — CONVERTED CEMR LAB
HCV Ab: POSITIVE — AB
HCV Quantitative: <5 intl units/mL (ref <5)
Hep B S Ab: POSITIVE — AB
Hepatitis B Surface Ag: NEGATIVE

## 2007-03-26 ENCOUNTER — Encounter (INDEPENDENT_AMBULATORY_CARE_PROVIDER_SITE_OTHER): Payer: Self-pay | Admitting: Family Medicine

## 2007-03-27 ENCOUNTER — Telehealth: Payer: Self-pay | Admitting: *Deleted

## 2007-03-30 ENCOUNTER — Ambulatory Visit: Payer: Self-pay | Admitting: Family Medicine

## 2007-04-24 ENCOUNTER — Telehealth (INDEPENDENT_AMBULATORY_CARE_PROVIDER_SITE_OTHER): Payer: Self-pay | Admitting: Family Medicine

## 2007-05-12 ENCOUNTER — Ambulatory Visit (HOSPITAL_COMMUNITY): Admission: RE | Admit: 2007-05-12 | Discharge: 2007-05-12 | Payer: Self-pay | Admitting: Family Medicine

## 2007-05-18 ENCOUNTER — Encounter (INDEPENDENT_AMBULATORY_CARE_PROVIDER_SITE_OTHER): Payer: Self-pay | Admitting: Family Medicine

## 2007-05-25 ENCOUNTER — Telehealth: Payer: Self-pay | Admitting: *Deleted

## 2007-05-30 ENCOUNTER — Encounter: Payer: Self-pay | Admitting: *Deleted

## 2007-06-09 ENCOUNTER — Ambulatory Visit: Payer: Self-pay | Admitting: Family Medicine

## 2007-06-13 ENCOUNTER — Telehealth: Payer: Self-pay | Admitting: *Deleted

## 2007-06-14 ENCOUNTER — Ambulatory Visit: Payer: Self-pay | Admitting: Family Medicine

## 2007-07-03 ENCOUNTER — Ambulatory Visit: Payer: Self-pay | Admitting: Family Medicine

## 2007-07-10 ENCOUNTER — Telehealth (INDEPENDENT_AMBULATORY_CARE_PROVIDER_SITE_OTHER): Payer: Self-pay | Admitting: Family Medicine

## 2007-07-11 ENCOUNTER — Telehealth (INDEPENDENT_AMBULATORY_CARE_PROVIDER_SITE_OTHER): Payer: Self-pay | Admitting: Family Medicine

## 2007-07-13 ENCOUNTER — Encounter (INDEPENDENT_AMBULATORY_CARE_PROVIDER_SITE_OTHER): Payer: Self-pay | Admitting: Family Medicine

## 2007-07-13 ENCOUNTER — Ambulatory Visit: Payer: Self-pay | Admitting: Family Medicine

## 2007-07-19 ENCOUNTER — Encounter (INDEPENDENT_AMBULATORY_CARE_PROVIDER_SITE_OTHER): Payer: Self-pay | Admitting: Family Medicine

## 2007-07-20 ENCOUNTER — Ambulatory Visit: Payer: Self-pay | Admitting: Family Medicine

## 2007-07-20 ENCOUNTER — Encounter (INDEPENDENT_AMBULATORY_CARE_PROVIDER_SITE_OTHER): Payer: Self-pay | Admitting: Family Medicine

## 2007-07-20 LAB — CONVERTED CEMR LAB

## 2007-08-01 ENCOUNTER — Encounter: Payer: Self-pay | Admitting: *Deleted

## 2007-08-08 ENCOUNTER — Encounter (INDEPENDENT_AMBULATORY_CARE_PROVIDER_SITE_OTHER): Payer: Self-pay | Admitting: Family Medicine

## 2007-08-11 ENCOUNTER — Telehealth: Payer: Self-pay | Admitting: *Deleted

## 2007-08-16 ENCOUNTER — Ambulatory Visit: Payer: Self-pay | Admitting: Family Medicine

## 2007-08-16 DIAGNOSIS — M25559 Pain in unspecified hip: Secondary | ICD-10-CM | POA: Insufficient documentation

## 2007-08-30 ENCOUNTER — Telehealth (INDEPENDENT_AMBULATORY_CARE_PROVIDER_SITE_OTHER): Payer: Self-pay | Admitting: Family Medicine

## 2007-09-12 ENCOUNTER — Ambulatory Visit (HOSPITAL_COMMUNITY): Admission: RE | Admit: 2007-09-12 | Discharge: 2007-09-12 | Payer: Self-pay | Admitting: Family Medicine

## 2007-09-13 ENCOUNTER — Ambulatory Visit: Payer: Self-pay | Admitting: Family Medicine

## 2007-09-26 ENCOUNTER — Telehealth: Payer: Self-pay | Admitting: *Deleted

## 2007-09-27 ENCOUNTER — Ambulatory Visit: Payer: Self-pay | Admitting: Family Medicine

## 2007-10-02 ENCOUNTER — Telehealth (INDEPENDENT_AMBULATORY_CARE_PROVIDER_SITE_OTHER): Payer: Self-pay | Admitting: Family Medicine

## 2007-10-03 ENCOUNTER — Encounter (INDEPENDENT_AMBULATORY_CARE_PROVIDER_SITE_OTHER): Payer: Self-pay | Admitting: Family Medicine

## 2007-10-11 ENCOUNTER — Ambulatory Visit: Payer: Self-pay | Admitting: Family Medicine

## 2007-10-11 ENCOUNTER — Encounter: Payer: Self-pay | Admitting: Family Medicine

## 2007-10-13 ENCOUNTER — Telehealth (INDEPENDENT_AMBULATORY_CARE_PROVIDER_SITE_OTHER): Payer: Self-pay | Admitting: *Deleted

## 2007-10-16 ENCOUNTER — Telehealth: Payer: Self-pay | Admitting: Family Medicine

## 2007-10-17 ENCOUNTER — Ambulatory Visit: Payer: Self-pay | Admitting: Family Medicine

## 2007-10-17 ENCOUNTER — Encounter: Payer: Self-pay | Admitting: Family Medicine

## 2007-10-17 LAB — CONVERTED CEMR LAB
Alkaline Phosphatase: 62 units/L (ref 39–117)
BUN: 14 mg/dL (ref 6–23)
CO2: 25 meq/L (ref 19–32)
Creatinine, Ser: 0.83 mg/dL (ref 0.40–1.20)
Glucose, Bld: 104 mg/dL — ABNORMAL HIGH (ref 70–99)
Total Bilirubin: 1 mg/dL (ref 0.3–1.2)
Total Protein: 8.1 g/dL (ref 6.0–8.3)

## 2007-10-24 ENCOUNTER — Encounter: Payer: Self-pay | Admitting: *Deleted

## 2007-11-22 ENCOUNTER — Encounter: Payer: Self-pay | Admitting: *Deleted

## 2007-11-23 ENCOUNTER — Ambulatory Visit: Payer: Self-pay | Admitting: Family Medicine

## 2007-11-28 ENCOUNTER — Ambulatory Visit: Payer: Self-pay | Admitting: Family Medicine

## 2007-11-28 DIAGNOSIS — G4700 Insomnia, unspecified: Secondary | ICD-10-CM | POA: Insufficient documentation

## 2007-12-01 ENCOUNTER — Telehealth (INDEPENDENT_AMBULATORY_CARE_PROVIDER_SITE_OTHER): Payer: Self-pay | Admitting: Family Medicine

## 2007-12-05 ENCOUNTER — Ambulatory Visit: Payer: Self-pay | Admitting: Family Medicine

## 2007-12-15 ENCOUNTER — Ambulatory Visit: Payer: Self-pay | Admitting: Family Medicine

## 2007-12-15 LAB — CONVERTED CEMR LAB: Whiff Test: NEGATIVE

## 2007-12-26 ENCOUNTER — Encounter: Payer: Self-pay | Admitting: *Deleted

## 2008-01-03 ENCOUNTER — Telehealth: Payer: Self-pay | Admitting: *Deleted

## 2008-01-05 ENCOUNTER — Encounter: Payer: Self-pay | Admitting: *Deleted

## 2008-01-09 ENCOUNTER — Telehealth: Payer: Self-pay | Admitting: *Deleted

## 2008-01-10 ENCOUNTER — Encounter: Payer: Self-pay | Admitting: Family Medicine

## 2008-01-10 ENCOUNTER — Ambulatory Visit: Payer: Self-pay | Admitting: Family Medicine

## 2008-01-15 ENCOUNTER — Encounter: Payer: Self-pay | Admitting: Family Medicine

## 2008-01-16 ENCOUNTER — Telehealth: Payer: Self-pay | Admitting: Family Medicine

## 2008-01-19 ENCOUNTER — Ambulatory Visit: Payer: Self-pay | Admitting: Family Medicine

## 2008-01-25 ENCOUNTER — Ambulatory Visit: Payer: Self-pay | Admitting: Family Medicine

## 2008-01-31 ENCOUNTER — Telehealth: Payer: Self-pay | Admitting: *Deleted

## 2008-02-01 ENCOUNTER — Ambulatory Visit (HOSPITAL_COMMUNITY): Admission: RE | Admit: 2008-02-01 | Discharge: 2008-02-01 | Payer: Self-pay | Admitting: Family Medicine

## 2008-02-01 ENCOUNTER — Ambulatory Visit: Payer: Self-pay | Admitting: Family Medicine

## 2008-02-01 ENCOUNTER — Telehealth: Payer: Self-pay | Admitting: *Deleted

## 2008-02-08 ENCOUNTER — Encounter: Payer: Self-pay | Admitting: Family Medicine

## 2008-02-13 ENCOUNTER — Telehealth (INDEPENDENT_AMBULATORY_CARE_PROVIDER_SITE_OTHER): Payer: Self-pay | Admitting: *Deleted

## 2008-02-13 ENCOUNTER — Telehealth: Payer: Self-pay | Admitting: Family Medicine

## 2008-02-14 ENCOUNTER — Encounter: Payer: Self-pay | Admitting: Family Medicine

## 2008-02-14 ENCOUNTER — Ambulatory Visit: Payer: Self-pay | Admitting: Family Medicine

## 2008-02-14 LAB — CONVERTED CEMR LAB
Bilirubin Urine: NEGATIVE
Glucose, Urine, Semiquant: NEGATIVE
Ketones, urine, test strip: NEGATIVE
Nitrite: NEGATIVE
Specific Gravity, Urine: 1.015
Whiff Test: NEGATIVE
pH: 7.5

## 2008-02-15 ENCOUNTER — Encounter: Payer: Self-pay | Admitting: Family Medicine

## 2008-02-21 ENCOUNTER — Telehealth (INDEPENDENT_AMBULATORY_CARE_PROVIDER_SITE_OTHER): Payer: Self-pay | Admitting: *Deleted

## 2008-03-06 ENCOUNTER — Telehealth: Payer: Self-pay | Admitting: *Deleted

## 2008-03-07 ENCOUNTER — Ambulatory Visit: Payer: Self-pay | Admitting: Family Medicine

## 2008-03-11 ENCOUNTER — Telehealth: Payer: Self-pay | Admitting: Family Medicine

## 2008-03-12 ENCOUNTER — Telehealth (INDEPENDENT_AMBULATORY_CARE_PROVIDER_SITE_OTHER): Payer: Self-pay | Admitting: *Deleted

## 2008-03-13 ENCOUNTER — Telehealth: Payer: Self-pay | Admitting: Family Medicine

## 2008-03-18 ENCOUNTER — Telehealth: Payer: Self-pay | Admitting: Family Medicine

## 2008-03-20 ENCOUNTER — Telehealth (INDEPENDENT_AMBULATORY_CARE_PROVIDER_SITE_OTHER): Payer: Self-pay | Admitting: *Deleted

## 2008-03-26 ENCOUNTER — Telehealth: Payer: Self-pay | Admitting: *Deleted

## 2008-03-27 ENCOUNTER — Encounter: Payer: Self-pay | Admitting: Family Medicine

## 2008-03-27 ENCOUNTER — Ambulatory Visit: Payer: Self-pay | Admitting: Family Medicine

## 2008-04-01 ENCOUNTER — Encounter: Payer: Self-pay | Admitting: Family Medicine

## 2008-04-04 ENCOUNTER — Ambulatory Visit: Payer: Self-pay | Admitting: Sports Medicine

## 2008-04-09 ENCOUNTER — Telehealth: Payer: Self-pay | Admitting: *Deleted

## 2008-04-09 ENCOUNTER — Ambulatory Visit: Payer: Self-pay | Admitting: Family Medicine

## 2008-04-09 ENCOUNTER — Encounter: Payer: Self-pay | Admitting: Family Medicine

## 2008-04-09 LAB — CONVERTED CEMR LAB
BUN: 18 mg/dL (ref 6–23)
Chloride: 101 meq/L (ref 96–112)
Creatinine, Ser: 1.01 mg/dL (ref 0.40–1.20)
Glucose, Bld: 89 mg/dL (ref 70–99)
HCT: 39 % (ref 36.0–46.0)
Hemoglobin: 12.1 g/dL (ref 12.0–15.0)
MCHC: 31 g/dL (ref 30.0–36.0)
Potassium: 3.8 meq/L (ref 3.5–5.3)
RBC: 4.95 M/uL (ref 3.87–5.11)
TSH: 1.093 microintl units/mL (ref 0.350–4.50)

## 2008-04-10 ENCOUNTER — Telehealth: Payer: Self-pay | Admitting: *Deleted

## 2008-05-02 ENCOUNTER — Ambulatory Visit: Payer: Self-pay | Admitting: Family Medicine

## 2008-05-02 ENCOUNTER — Telehealth: Payer: Self-pay | Admitting: *Deleted

## 2008-05-03 ENCOUNTER — Telehealth (INDEPENDENT_AMBULATORY_CARE_PROVIDER_SITE_OTHER): Payer: Self-pay | Admitting: *Deleted

## 2008-05-13 ENCOUNTER — Encounter: Payer: Self-pay | Admitting: Family Medicine

## 2008-05-13 ENCOUNTER — Telehealth: Payer: Self-pay | Admitting: Family Medicine

## 2008-05-23 ENCOUNTER — Ambulatory Visit (HOSPITAL_COMMUNITY): Admission: RE | Admit: 2008-05-23 | Discharge: 2008-05-23 | Payer: Self-pay | Admitting: Family Medicine

## 2008-05-27 ENCOUNTER — Encounter: Payer: Self-pay | Admitting: Family Medicine

## 2008-05-28 ENCOUNTER — Encounter: Payer: Self-pay | Admitting: *Deleted

## 2008-05-31 ENCOUNTER — Ambulatory Visit: Payer: Self-pay | Admitting: Family Medicine

## 2008-05-31 ENCOUNTER — Encounter: Payer: Self-pay | Admitting: Family Medicine

## 2008-05-31 LAB — CONVERTED CEMR LAB
Total CHOL/HDL Ratio: 3.1
VLDL: 31 mg/dL (ref 0–40)

## 2008-06-03 ENCOUNTER — Ambulatory Visit: Payer: Self-pay | Admitting: Family Medicine

## 2008-06-05 ENCOUNTER — Telehealth: Payer: Self-pay | Admitting: Family Medicine

## 2008-06-11 ENCOUNTER — Telehealth: Payer: Self-pay | Admitting: *Deleted

## 2008-06-12 ENCOUNTER — Telehealth: Payer: Self-pay | Admitting: Family Medicine

## 2008-06-14 ENCOUNTER — Ambulatory Visit: Payer: Self-pay | Admitting: Family Medicine

## 2008-06-24 ENCOUNTER — Ambulatory Visit: Payer: Self-pay | Admitting: Family Medicine

## 2008-06-27 ENCOUNTER — Ambulatory Visit: Payer: Self-pay | Admitting: Family Medicine

## 2008-06-27 ENCOUNTER — Encounter: Payer: Self-pay | Admitting: Family Medicine

## 2008-07-05 ENCOUNTER — Telehealth: Payer: Self-pay | Admitting: Family Medicine

## 2008-07-08 ENCOUNTER — Telehealth (INDEPENDENT_AMBULATORY_CARE_PROVIDER_SITE_OTHER): Payer: Self-pay | Admitting: *Deleted

## 2008-07-16 ENCOUNTER — Ambulatory Visit: Payer: Self-pay | Admitting: Family Medicine

## 2008-07-28 ENCOUNTER — Emergency Department (HOSPITAL_COMMUNITY): Admission: EM | Admit: 2008-07-28 | Discharge: 2008-07-28 | Payer: Self-pay | Admitting: Emergency Medicine

## 2008-08-02 ENCOUNTER — Encounter: Payer: Self-pay | Admitting: Family Medicine

## 2008-08-02 ENCOUNTER — Ambulatory Visit: Payer: Self-pay | Admitting: Family Medicine

## 2008-08-02 ENCOUNTER — Telehealth: Payer: Self-pay | Admitting: Family Medicine

## 2008-08-07 ENCOUNTER — Telehealth (INDEPENDENT_AMBULATORY_CARE_PROVIDER_SITE_OTHER): Payer: Self-pay | Admitting: *Deleted

## 2008-08-07 ENCOUNTER — Telehealth: Payer: Self-pay | Admitting: Family Medicine

## 2008-08-08 ENCOUNTER — Ambulatory Visit: Payer: Self-pay | Admitting: Family Medicine

## 2008-08-08 ENCOUNTER — Ambulatory Visit (HOSPITAL_COMMUNITY): Admission: RE | Admit: 2008-08-08 | Discharge: 2008-08-08 | Payer: Self-pay | Admitting: Family Medicine

## 2008-09-02 ENCOUNTER — Ambulatory Visit: Payer: Self-pay | Admitting: Family Medicine

## 2008-09-03 ENCOUNTER — Encounter: Payer: Self-pay | Admitting: Family Medicine

## 2008-09-03 ENCOUNTER — Telehealth: Payer: Self-pay | Admitting: *Deleted

## 2008-09-25 ENCOUNTER — Ambulatory Visit: Payer: Self-pay | Admitting: Family Medicine

## 2008-09-25 ENCOUNTER — Encounter: Payer: Self-pay | Admitting: Family Medicine

## 2008-09-25 ENCOUNTER — Telehealth: Payer: Self-pay | Admitting: Family Medicine

## 2008-09-27 LAB — CONVERTED CEMR LAB
AST: 36 units/L (ref 0–37)
Albumin: 4.1 g/dL (ref 3.5–5.2)
Alkaline Phosphatase: 63 units/L (ref 39–117)
Potassium: 4 meq/L (ref 3.5–5.3)
Sodium: 138 meq/L (ref 135–145)
Total Protein: 7.6 g/dL (ref 6.0–8.3)

## 2008-10-03 ENCOUNTER — Telehealth: Payer: Self-pay | Admitting: Family Medicine

## 2008-10-04 ENCOUNTER — Ambulatory Visit: Payer: Self-pay | Admitting: Family Medicine

## 2008-10-04 DIAGNOSIS — G894 Chronic pain syndrome: Secondary | ICD-10-CM | POA: Insufficient documentation

## 2008-10-29 ENCOUNTER — Telehealth: Payer: Self-pay | Admitting: Family Medicine

## 2008-11-01 ENCOUNTER — Telehealth: Payer: Self-pay | Admitting: *Deleted

## 2008-12-02 ENCOUNTER — Telehealth: Payer: Self-pay | Admitting: Family Medicine

## 2008-12-12 ENCOUNTER — Telehealth: Payer: Self-pay | Admitting: Family Medicine

## 2008-12-13 ENCOUNTER — Ambulatory Visit: Payer: Self-pay | Admitting: Family Medicine

## 2008-12-13 LAB — CONVERTED CEMR LAB
Nitrite: NEGATIVE
Urobilinogen, UA: 2

## 2008-12-31 ENCOUNTER — Telehealth: Payer: Self-pay | Admitting: Family Medicine

## 2009-01-03 ENCOUNTER — Ambulatory Visit: Payer: Self-pay | Admitting: Family Medicine

## 2009-01-31 ENCOUNTER — Telehealth: Payer: Self-pay | Admitting: Family Medicine

## 2009-02-07 ENCOUNTER — Ambulatory Visit: Payer: Self-pay | Admitting: Family Medicine

## 2009-02-14 ENCOUNTER — Ambulatory Visit: Payer: Self-pay | Admitting: Family Medicine

## 2009-02-14 DIAGNOSIS — K5900 Constipation, unspecified: Secondary | ICD-10-CM | POA: Insufficient documentation

## 2009-02-21 ENCOUNTER — Ambulatory Visit: Payer: Self-pay | Admitting: Family Medicine

## 2009-02-26 ENCOUNTER — Telehealth: Payer: Self-pay | Admitting: Family Medicine

## 2009-02-27 ENCOUNTER — Ambulatory Visit: Payer: Self-pay | Admitting: Family Medicine

## 2009-03-04 ENCOUNTER — Ambulatory Visit: Payer: Self-pay | Admitting: Family Medicine

## 2009-04-02 ENCOUNTER — Encounter: Payer: Self-pay | Admitting: Family Medicine

## 2009-04-02 ENCOUNTER — Telehealth: Payer: Self-pay | Admitting: Family Medicine

## 2009-04-23 ENCOUNTER — Ambulatory Visit: Payer: Self-pay | Admitting: Family Medicine

## 2009-04-28 ENCOUNTER — Telehealth: Payer: Self-pay | Admitting: Family Medicine

## 2009-05-02 ENCOUNTER — Ambulatory Visit: Payer: Self-pay | Admitting: Family Medicine

## 2009-06-03 ENCOUNTER — Telehealth: Payer: Self-pay | Admitting: Family Medicine

## 2009-06-09 ENCOUNTER — Ambulatory Visit: Payer: Self-pay | Admitting: Family Medicine

## 2009-06-09 ENCOUNTER — Encounter: Payer: Self-pay | Admitting: Family Medicine

## 2009-06-09 DIAGNOSIS — K219 Gastro-esophageal reflux disease without esophagitis: Secondary | ICD-10-CM | POA: Insufficient documentation

## 2009-06-09 LAB — CONVERTED CEMR LAB: Direct LDL: 94 mg/dL

## 2009-06-10 ENCOUNTER — Telehealth (INDEPENDENT_AMBULATORY_CARE_PROVIDER_SITE_OTHER): Payer: Self-pay | Admitting: *Deleted

## 2009-06-12 ENCOUNTER — Encounter: Payer: Self-pay | Admitting: Family Medicine

## 2009-06-18 ENCOUNTER — Telehealth: Payer: Self-pay | Admitting: Family Medicine

## 2009-06-18 ENCOUNTER — Ambulatory Visit (HOSPITAL_COMMUNITY): Admission: RE | Admit: 2009-06-18 | Discharge: 2009-06-18 | Payer: Self-pay | Admitting: Family Medicine

## 2009-06-25 ENCOUNTER — Telehealth: Payer: Self-pay | Admitting: Family Medicine

## 2009-07-03 ENCOUNTER — Telehealth: Payer: Self-pay | Admitting: Family Medicine

## 2009-07-04 ENCOUNTER — Encounter: Payer: Self-pay | Admitting: Family Medicine

## 2009-07-04 ENCOUNTER — Ambulatory Visit: Payer: Self-pay | Admitting: Family Medicine

## 2009-07-04 LAB — CONVERTED CEMR LAB: Cholesterol, target level: 200 mg/dL

## 2009-07-07 LAB — CONVERTED CEMR LAB: TSH: 0.606 microintl units/mL (ref 0.350–4.500)

## 2009-07-23 ENCOUNTER — Telehealth: Payer: Self-pay | Admitting: Family Medicine

## 2009-07-24 ENCOUNTER — Emergency Department (HOSPITAL_COMMUNITY): Admission: EM | Admit: 2009-07-24 | Discharge: 2009-07-25 | Payer: Self-pay | Admitting: Emergency Medicine

## 2009-08-01 ENCOUNTER — Encounter: Payer: Self-pay | Admitting: Family Medicine

## 2009-08-01 ENCOUNTER — Ambulatory Visit: Payer: Self-pay | Admitting: Family Medicine

## 2009-08-04 ENCOUNTER — Telehealth: Payer: Self-pay | Admitting: Family Medicine

## 2009-08-05 ENCOUNTER — Encounter: Payer: Self-pay | Admitting: Family Medicine

## 2009-08-18 ENCOUNTER — Telehealth: Payer: Self-pay | Admitting: Family Medicine

## 2009-08-20 ENCOUNTER — Encounter: Payer: Self-pay | Admitting: Family Medicine

## 2009-08-20 ENCOUNTER — Ambulatory Visit: Payer: Self-pay | Admitting: Family Medicine

## 2009-09-16 ENCOUNTER — Telehealth: Payer: Self-pay | Admitting: Family Medicine

## 2009-09-23 ENCOUNTER — Telehealth: Payer: Self-pay | Admitting: *Deleted

## 2009-09-29 ENCOUNTER — Ambulatory Visit: Payer: Self-pay | Admitting: Family Medicine

## 2009-09-29 ENCOUNTER — Encounter: Payer: Self-pay | Admitting: Family Medicine

## 2009-09-29 LAB — CONVERTED CEMR LAB
CO2: 23 meq/L (ref 19–32)
Calcium: 9.5 mg/dL (ref 8.4–10.5)
Sodium: 137 meq/L (ref 135–145)

## 2009-10-10 ENCOUNTER — Telehealth: Payer: Self-pay | Admitting: Family Medicine

## 2009-10-10 ENCOUNTER — Ambulatory Visit: Payer: Self-pay | Admitting: Family Medicine

## 2009-10-10 ENCOUNTER — Encounter: Payer: Self-pay | Admitting: Family Medicine

## 2009-10-10 DIAGNOSIS — N950 Postmenopausal bleeding: Secondary | ICD-10-CM | POA: Insufficient documentation

## 2009-10-10 LAB — CONVERTED CEMR LAB
Blood in Urine, dipstick: NEGATIVE
Chlamydia, DNA Probe: NEGATIVE
Ketones, urine, test strip: NEGATIVE
Nitrite: NEGATIVE
Specific Gravity, Urine: >=1.03
Whiff Test: NEGATIVE
pH: 5.5

## 2009-10-13 ENCOUNTER — Encounter: Payer: Self-pay | Admitting: Family Medicine

## 2009-10-13 ENCOUNTER — Ambulatory Visit: Payer: Self-pay | Admitting: Family Medicine

## 2009-10-13 LAB — CONVERTED CEMR LAB
ALT: 41 units/L — ABNORMAL HIGH (ref 0–35)
AST: 60 units/L — ABNORMAL HIGH (ref 0–37)
Albumin: 4.2 g/dL (ref 3.5–5.2)
Alkaline Phosphatase: 88 units/L (ref 39–117)
MCHC: 31.8 g/dL (ref 30.0–36.0)
Platelets: 213 10*3/uL (ref 150–400)
Potassium: 3.4 meq/L — ABNORMAL LOW (ref 3.5–5.3)
RBC: 4.85 M/uL (ref 3.87–5.11)
RDW: 15.7 % — ABNORMAL HIGH (ref 11.5–15.5)
Sodium: 140 meq/L (ref 135–145)
Total Protein: 7.9 g/dL (ref 6.0–8.3)

## 2009-10-15 ENCOUNTER — Encounter: Payer: Self-pay | Admitting: Family Medicine

## 2009-10-24 ENCOUNTER — Telehealth: Payer: Self-pay | Admitting: *Deleted

## 2009-10-24 ENCOUNTER — Ambulatory Visit (HOSPITAL_COMMUNITY): Admission: RE | Admit: 2009-10-24 | Discharge: 2009-10-24 | Payer: Self-pay | Admitting: Family Medicine

## 2009-10-24 ENCOUNTER — Encounter: Payer: Self-pay | Admitting: Family Medicine

## 2009-10-24 DIAGNOSIS — D259 Leiomyoma of uterus, unspecified: Secondary | ICD-10-CM | POA: Insufficient documentation

## 2009-11-03 ENCOUNTER — Ambulatory Visit: Payer: Self-pay | Admitting: Family Medicine

## 2009-11-03 ENCOUNTER — Telehealth: Payer: Self-pay | Admitting: Family Medicine

## 2009-11-06 ENCOUNTER — Ambulatory Visit: Payer: Self-pay | Admitting: Family Medicine

## 2009-11-07 ENCOUNTER — Encounter: Payer: Self-pay | Admitting: Family Medicine

## 2009-11-14 ENCOUNTER — Telehealth: Payer: Self-pay | Admitting: Family Medicine

## 2009-11-17 ENCOUNTER — Encounter: Payer: Self-pay | Admitting: Family Medicine

## 2009-11-17 ENCOUNTER — Ambulatory Visit: Payer: Self-pay | Admitting: Family Medicine

## 2009-11-17 LAB — CONVERTED CEMR LAB
HCT: 36.4 % (ref 36.0–46.0)
Hemoglobin: 11.3 g/dL — ABNORMAL LOW (ref 12.0–15.0)
MCHC: 31 g/dL (ref 30.0–36.0)
MCV: 87.3 fL (ref 78.0–100.0)
Platelets: 253 10*3/uL (ref 150–400)
RDW: 15.6 % — ABNORMAL HIGH (ref 11.5–15.5)

## 2009-11-26 ENCOUNTER — Ambulatory Visit: Payer: Self-pay | Admitting: Obstetrics and Gynecology

## 2009-11-26 ENCOUNTER — Other Ambulatory Visit: Admission: RE | Admit: 2009-11-26 | Discharge: 2009-11-26 | Payer: Self-pay | Admitting: Obstetrics and Gynecology

## 2009-11-27 ENCOUNTER — Encounter: Payer: Self-pay | Admitting: Obstetrics and Gynecology

## 2009-11-28 ENCOUNTER — Encounter: Payer: Self-pay | Admitting: *Deleted

## 2009-11-28 ENCOUNTER — Ambulatory Visit: Payer: Self-pay | Admitting: Family Medicine

## 2009-12-11 ENCOUNTER — Ambulatory Visit: Payer: Self-pay | Admitting: Obstetrics and Gynecology

## 2009-12-16 ENCOUNTER — Telehealth: Payer: Self-pay | Admitting: Family Medicine

## 2009-12-17 ENCOUNTER — Encounter: Payer: Self-pay | Admitting: Family Medicine

## 2009-12-17 ENCOUNTER — Ambulatory Visit: Payer: Self-pay | Admitting: Family Medicine

## 2009-12-17 ENCOUNTER — Telehealth: Payer: Self-pay | Admitting: Family Medicine

## 2009-12-17 DIAGNOSIS — G43909 Migraine, unspecified, not intractable, without status migrainosus: Secondary | ICD-10-CM | POA: Insufficient documentation

## 2009-12-29 ENCOUNTER — Telehealth: Payer: Self-pay | Admitting: Family Medicine

## 2010-01-08 ENCOUNTER — Encounter: Payer: Self-pay | Admitting: Family Medicine

## 2010-01-08 ENCOUNTER — Ambulatory Visit: Payer: Self-pay | Admitting: Family Medicine

## 2010-01-09 ENCOUNTER — Telehealth: Payer: Self-pay | Admitting: *Deleted

## 2010-01-19 ENCOUNTER — Ambulatory Visit: Payer: Self-pay | Admitting: Family Medicine

## 2010-01-19 ENCOUNTER — Telehealth: Payer: Self-pay | Admitting: Family Medicine

## 2010-01-20 ENCOUNTER — Encounter: Payer: Self-pay | Admitting: Family Medicine

## 2010-01-20 ENCOUNTER — Ambulatory Visit: Payer: Self-pay | Admitting: Family Medicine

## 2010-01-20 LAB — CONVERTED CEMR LAB
AST: 36 units/L (ref 0–37)
Albumin: 3.9 g/dL (ref 3.5–5.2)
BUN: 15 mg/dL (ref 6–23)
CO2: 27 meq/L (ref 19–32)
Calcium: 9 mg/dL (ref 8.4–10.5)
Chloride: 103 meq/L (ref 96–112)
Glucose, Bld: 130 mg/dL — ABNORMAL HIGH (ref 70–99)
Potassium: 3.6 meq/L (ref 3.5–5.3)

## 2010-01-27 ENCOUNTER — Telehealth: Payer: Self-pay | Admitting: Family Medicine

## 2010-02-03 ENCOUNTER — Encounter: Payer: Self-pay | Admitting: *Deleted

## 2010-02-03 ENCOUNTER — Ambulatory Visit: Payer: Self-pay | Admitting: Family Medicine

## 2010-02-03 DIAGNOSIS — M25569 Pain in unspecified knee: Secondary | ICD-10-CM | POA: Insufficient documentation

## 2010-02-06 ENCOUNTER — Ambulatory Visit (HOSPITAL_COMMUNITY): Admission: RE | Admit: 2010-02-06 | Discharge: 2010-02-06 | Payer: Self-pay | Admitting: Family Medicine

## 2010-02-12 ENCOUNTER — Telehealth: Payer: Self-pay | Admitting: Family Medicine

## 2010-02-16 ENCOUNTER — Ambulatory Visit: Payer: Self-pay

## 2010-02-25 ENCOUNTER — Telehealth: Payer: Self-pay | Admitting: Family Medicine

## 2010-02-26 ENCOUNTER — Ambulatory Visit: Payer: Self-pay | Admitting: Family Medicine

## 2010-02-26 ENCOUNTER — Telehealth (INDEPENDENT_AMBULATORY_CARE_PROVIDER_SITE_OTHER): Payer: Self-pay | Admitting: Family Medicine

## 2010-02-26 ENCOUNTER — Telehealth: Payer: Self-pay | Admitting: *Deleted

## 2010-03-02 ENCOUNTER — Encounter
Admission: RE | Admit: 2010-03-02 | Discharge: 2010-04-08 | Payer: Self-pay | Source: Home / Self Care | Attending: Family Medicine | Admitting: Family Medicine

## 2010-03-06 ENCOUNTER — Ambulatory Visit (HOSPITAL_COMMUNITY): Admission: RE | Admit: 2010-03-06 | Discharge: 2010-03-06 | Payer: Self-pay | Admitting: Family Medicine

## 2010-03-06 ENCOUNTER — Telehealth: Payer: Self-pay | Admitting: Family Medicine

## 2010-03-09 ENCOUNTER — Ambulatory Visit: Payer: Self-pay | Admitting: Family Medicine

## 2010-03-09 ENCOUNTER — Encounter: Payer: Self-pay | Admitting: Sports Medicine

## 2010-03-11 ENCOUNTER — Encounter: Payer: Self-pay | Admitting: Sports Medicine

## 2010-03-18 ENCOUNTER — Encounter: Payer: Self-pay | Admitting: Family Medicine

## 2010-03-23 ENCOUNTER — Ambulatory Visit: Payer: Self-pay | Admitting: Family Medicine

## 2010-03-23 ENCOUNTER — Encounter: Payer: Self-pay | Admitting: *Deleted

## 2010-03-25 ENCOUNTER — Telehealth: Payer: Self-pay | Admitting: Family Medicine

## 2010-04-03 ENCOUNTER — Telehealth: Payer: Self-pay | Admitting: Family Medicine

## 2010-04-15 ENCOUNTER — Encounter
Admission: RE | Admit: 2010-04-15 | Discharge: 2010-04-16 | Payer: Self-pay | Source: Home / Self Care | Attending: Family Medicine | Admitting: Family Medicine

## 2010-04-22 ENCOUNTER — Telehealth: Payer: Self-pay | Admitting: Family Medicine

## 2010-04-22 ENCOUNTER — Encounter
Admission: RE | Admit: 2010-04-22 | Discharge: 2010-05-19 | Payer: Self-pay | Source: Home / Self Care | Attending: Family Medicine | Admitting: Family Medicine

## 2010-04-29 ENCOUNTER — Encounter: Admit: 2010-04-29 | Payer: Self-pay | Admitting: Family Medicine

## 2010-05-06 ENCOUNTER — Ambulatory Visit: Admission: RE | Admit: 2010-05-06 | Discharge: 2010-05-06 | Payer: Self-pay | Source: Home / Self Care

## 2010-05-06 ENCOUNTER — Encounter: Payer: Self-pay | Admitting: Family Medicine

## 2010-05-06 ENCOUNTER — Ambulatory Visit (HOSPITAL_COMMUNITY)
Admission: RE | Admit: 2010-05-06 | Discharge: 2010-05-06 | Payer: Self-pay | Source: Home / Self Care | Attending: Family Medicine | Admitting: Family Medicine

## 2010-05-06 DIAGNOSIS — R252 Cramp and spasm: Secondary | ICD-10-CM | POA: Insufficient documentation

## 2010-05-06 DIAGNOSIS — G8929 Other chronic pain: Secondary | ICD-10-CM | POA: Insufficient documentation

## 2010-05-06 DIAGNOSIS — M25571 Pain in right ankle and joints of right foot: Secondary | ICD-10-CM

## 2010-05-06 LAB — CONVERTED CEMR LAB
ALT: 27 units/L (ref 0–35)
AST: 36 units/L (ref 0–37)
Albumin: 4.4 g/dL (ref 3.5–5.2)
Alkaline Phosphatase: 59 units/L (ref 39–117)
Potassium: 3.8 meq/L (ref 3.5–5.3)
Sodium: 138 meq/L (ref 135–145)
Total Protein: 7.4 g/dL (ref 6.0–8.3)

## 2010-05-09 ENCOUNTER — Encounter: Payer: Self-pay | Admitting: Family Medicine

## 2010-05-09 ENCOUNTER — Encounter: Payer: Self-pay | Admitting: Sports Medicine

## 2010-05-10 ENCOUNTER — Encounter: Payer: Self-pay | Admitting: Family Medicine

## 2010-05-10 ENCOUNTER — Encounter: Payer: Self-pay | Admitting: Sports Medicine

## 2010-05-13 ENCOUNTER — Telehealth: Payer: Self-pay | Admitting: Family Medicine

## 2010-05-19 ENCOUNTER — Encounter: Admit: 2010-05-19 | Payer: Self-pay | Admitting: Family Medicine

## 2010-05-19 ENCOUNTER — Ambulatory Visit: Admission: RE | Admit: 2010-05-19 | Discharge: 2010-05-19 | Payer: Self-pay | Source: Home / Self Care

## 2010-05-19 DIAGNOSIS — IMO0002 Reserved for concepts with insufficient information to code with codable children: Secondary | ICD-10-CM | POA: Insufficient documentation

## 2010-05-19 DIAGNOSIS — L600 Ingrowing nail: Secondary | ICD-10-CM | POA: Insufficient documentation

## 2010-05-21 ENCOUNTER — Ambulatory Visit: Payer: Self-pay | Attending: Family Medicine | Admitting: Physical Therapy

## 2010-05-21 ENCOUNTER — Encounter: Payer: Self-pay | Admitting: *Deleted

## 2010-05-21 NOTE — Letter (Signed)
Summary: Generic Letter  [facility]  [address]   [place], [place] [address]   Phone: [phone]  Fax: [phone]    08/01/2009  Regarding:  Norma [name] [address] [place], [place]  [address]  To whom it may concern,  It is very important for Ms. Schlosser health that the carpet in her bedroom be removed.  She is suffering from severe allergies and this old carpet is likely contributing.  You may contact me for questions.     Sincerely,   Luretha Murphy NP

## 2010-05-21 NOTE — Progress Notes (Signed)
Summary: refill  Phone Note Refill Request Call back at Home Phone [phone] Message from:  Patient  Refills Requested: Medication #1:  HYDROCODONE-ACETAMINOPHEN 7.5-750 MG TABS one three times a day as needed pain [BMN] Out Pt Pharm  Initial call taken by: De Nurse,  January 27, 2010 8:46 AM    Prescriptions: HYDROCODONE-ACETAMINOPHEN 7.5-750 MG TABS (HYDROCODONE-ACETAMINOPHEN) one three times a day as needed pain  #90 x 0   Entered and Authorized by:   Luretha Murphy NP   Signed by:   Luretha Murphy NP on 01/27/2010   Method used:   Printed then faxed to ...       [facility]* (retail)       [address]       [address] Shipping/mailing       [place], [place]  [address]       Ph: [phone]       Fax: [phone]   RxID:   [id number]

## 2010-05-21 NOTE — Progress Notes (Signed)
Summary: RX  Phone Note Refill Request Call back at Home Phone [phone]   Refills Requested: Medication #1:  HYDROCODONE-ACETAMINOPHEN 7.5-750 MG TABS one three times a day as needed pain (may fill early) [BMN] Initial call taken by: Knox Royalty,  July 23, 2009 8:58 AM    Prescriptions: HYDROCODONE-ACETAMINOPHEN 7.5-750 MG TABS (HYDROCODONE-ACETAMINOPHEN) one three times a day as needed pain (may fill early) Brand medically necessary #90 x 0   Entered and Authorized by:   Luretha Murphy NP   Signed by:   Luretha Murphy NP on 07/23/2009   Method used:   Printed then faxed to ...       [facility] (retail)       [address] [place], [place]  [address]       Ph: [phone]       Fax: [phone]   RxID:   [id number]

## 2010-05-21 NOTE — Assessment & Plan Note (Signed)
Summary: migraine/[place]/saxon(u had a cxl at this time)   Vital Signs:  Patient profile:   50 year old female Weight:      261.0 pounds Temp:     99.2 degrees F Pulse rate:   87 / minute BP sitting:   127 / 83  (left arm)  Vitals Entered By: Starleen Blue RN (December 17, 2009 2:58 PM) CC: migraine Is Patient Diabetic? No Pain Assessment Patient in pain? yes     Location: head Intensity: 7   Primary Care Provider:  Luretha Murphy NP  CC:  migraine.  History of Present Illness: 50 yo F:  1. Migraine: x 4 days, was getting better then came back, bilateral temples, sharp to pounding, both eyes intermittently blurry, associated with runny nose, photophobia, and phonophobia. Denies dizziness, ear pain, sore throat, CP, SOB, N/V/D, LE edema. On Provera for vaginal bleeding/fibroid/anemia x 1 week - no bleeding at this time.  Habits & Providers  Alcohol-Tobacco-Diet     Tobacco Status: quit > 6 months  Current Medications (verified): 1)  Hydrochlorothiazide 25 Mg  Tabs (Hydrochlorothiazide) .Marland Kitchen.. 1 Tab By Mouth Daily 2)  Diltiazem Hcl Coated Beads 180 Mg Xr24h-Cap (Diltiazem Hcl Coated Beads) .... One Daily 3)  Hydrocodone-Acetaminophen 7.5-750 Mg Tabs (Hydrocodone-Acetaminophen) .... One Three Times A Day As Needed Pain 4)  Nexium 40 Mg Cpdr (Esomeprazole Magnesium) .... One Daily 5)  Flonase 50 Mcg/act Susp (Fluticasone Propionate) .... Tow Squirts in Each Nostril Daily 6)  Peg 3350  Powd (Polyethylene Glycol 3350) .Marland KitchenMarland KitchenMarland Kitchen 17 Gm in 4 Oz Water Two Times A Day For 3 Days Then Daily, Qs For One Month 7)  Celexa 40 Mg Tabs (Citalopram Hydrobromide) .... One Daily 8)  Trazodone Hcl 100 Mg Tabs (Trazodone Hcl) .... One Qhs 9)  Hydrocortisone 2.5 % Crea (Hydrocortisone) .... Apply To Affected Area Daily, 30 Gm Tube 10)  Ibuprofen 800 Mg Tabs (Ibuprofen) .... One Three Times A Day As Needed Pain 11)  Provera 5 Mg Tabs (Medroxyprogesterone Acetate) .... One Daily (Until Seen By Gyn) 12)   Promethazine Hcl 25 Mg  Tabs (Promethazine Hcl) .... One By Mouth Q 6 Hours As Needed Nausea  Allergies (verified): 1)  ! Prednisone 2)  * Nortryptyline PMH-FH-SH reviewed for relevance  Social History: Smoking Status:  quit > 6 months  Review of Systems      See HPI  Physical Exam  General:  Well-developed, well-nourished, in no acute distress; alert, appropriate and cooperative throughout examination. Vitals reviewed. Head:  Normocephalic and atraumatic without obvious abnormalities.  Eyes:  No corneal or conjunctival inflammation noted. EOMI. Perrla. Vision grossly normal. Ears:  R ear normal and L ear normal.   Nose:  External nasal examination shows no deformity or inflammation. Nasal mucosa are pink and moist without lesions or exudates. Mouth:  Oral mucosa and oropharynx without lesions or exudates.  Neurologic:  Alert & oriented X3 and cranial nerves II-XII intact.   Psych:  Oriented X3, memory intact for recent and remote, normally interactive, good eye contact, not anxious appearing, and not depressed appearing.     Impression & Recommendations:  Problem # 1:  MIGRAINE HEADACHE (ICD-346.90) Assessment New  No RED FLAGs. Rx Vicodin and Phenergan combo once she gets home (since she drove today) in order to help her to sleep and break the migaine. Stop the Provera. Follow up with PCP next week. Her updated medication list for this problem includes:    Hydrocodone-acetaminophen 7.5-750 Mg Tabs (Hydrocodone-acetaminophen) ..... One three  times a day as needed pain    Ibuprofen 800 Mg Tabs (Ibuprofen) ..... One three times a day as needed pain  Orders: [facility]- Est Level  3 (62130)  Complete Medication List: 1)  Hydrochlorothiazide 25 Mg Tabs (Hydrochlorothiazide) .Marland Kitchen.. 1 tab by mouth daily 2)  Diltiazem Hcl Coated Beads 180 Mg Xr24h-cap (Diltiazem hcl coated beads) .... One daily 3)  Hydrocodone-acetaminophen 7.5-750 Mg Tabs (Hydrocodone-acetaminophen) .... One three times a  day as needed pain 4)  Nexium 40 Mg Cpdr (Esomeprazole magnesium) .... One daily 5)  Flonase 50 Mcg/act Susp (Fluticasone propionate) .... Tow squirts in each nostril daily 6)  Peg 3350 Powd (Polyethylene glycol 3350) .Marland KitchenMarland Kitchen. 17 gm in 4 oz water two times a day for 3 days then daily, qs for one month 7)  Celexa 40 Mg Tabs (Citalopram hydrobromide) .... One daily 8)  Trazodone Hcl 100 Mg Tabs (Trazodone hcl) .... One qhs 9)  Hydrocortisone 2.5 % Crea (Hydrocortisone) .... Apply to affected area daily, 30 gm tube 10)  Ibuprofen 800 Mg Tabs (Ibuprofen) .... One three times a day as needed pain 11)  Provera 5 Mg Tabs (Medroxyprogesterone acetate) .... One daily (until seen by gyn) 12)  Promethazine Hcl 25 Mg Tabs (Promethazine hcl) .... One by mouth q 6 hours as needed nausea  Patient Instructions: 1)  When you get home, take one Vicodin and one Promethazine. This should help you to sleep without pain and break your migraine. 2)  Stop the Provera. Prescriptions: PROMETHAZINE HCL 25 MG  TABS (PROMETHAZINE HCL) ONE by mouth Q 6 HOURS as needed NAUSEA  #12 x 0   Entered and Authorized by:   Helane Rima DO   Signed by:   Helane Rima DO on 12/17/2009   Method used:   Electronically to        [facility] [phone]* (retail)       [address]       [place], [place]  [address]       Ph: [phone]       Fax: [phone]   RxID:   [id number]

## 2010-05-21 NOTE — Assessment & Plan Note (Signed)
Summary: RIGHT LEG GIVING OUT/KH   Vital Signs:  Patient profile:   50 year old female BP sitting:   138 / 90  Vitals Entered By: Lillia Pauls CMA (February 16, 2010 3:04 PM)  Primary Care Segundo Makela:  Luretha Murphy NP   History of Present Illness: Right knee pain and a sense of giving way--worse over last 2 weeks. No specific injury. No prior surgery. Painis sharp, within joint, does not radiate. no nuumbness or tingling in leg or foot.  Current Medications (verified): 1)  Hydrochlorothiazide 25 Mg  Tabs (Hydrochlorothiazide) .Marland Kitchen.. 1 Tab By Mouth Daily 2)  Diltiazem Hcl Coated Beads 180 Mg Xr24h-Cap (Diltiazem Hcl Coated Beads) .... One Daily 3)  Hydrocodone-Acetaminophen 7.5-750 Mg Tabs (Hydrocodone-Acetaminophen) .... One Three Times A Day As Needed Pain 4)  Nexium 40 Mg Cpdr (Esomeprazole Magnesium) .... One Daily 5)  Flonase 50 Mcg/act Susp (Fluticasone Propionate) .... Tow Squirts in Each Nostril Daily 6)  Peg 3350  Powd (Polyethylene Glycol 3350) .Marland KitchenMarland KitchenMarland Kitchen 17 Gm in 4 Oz Water Two Times A Day For 3 Days Then Daily, Qs For One Month 7)  Celexa 40 Mg Tabs (Citalopram Hydrobromide) .... One Daily 8)  Trazodone Hcl 100 Mg Tabs (Trazodone Hcl) .... One Qhs 9)  Hydrocortisone 2.5 % Crea (Hydrocortisone) .... Apply To Affected Area Daily, 30 Gm Tube 10)  Ibuprofen 800 Mg Tabs (Ibuprofen) .... One Three Times A Day As Needed Pain 11)  Lamisil 250 Mg Tabs (Terbinafine Hcl) .Marland Kitchen.. 1 Tab By Mouth Daily X 12 Weeks  Allergies: 1)  ! Prednisone 2)  * Nortryptyline  Review of Systems  The patient denies fever, weight loss, and weight gain.    Physical Exam  General:  alert, well-developed, well-nourished, well-hydrated, and overweight-appearing.   Msk:  R knee full flexion and extension.  Pain with Mcmurray but no pop. Lachman normal, normal varus and valgus stability. No posterior drawer.  No bruising or effusion. No erythema or warmthofknee. Medial joint line slightly TTP Additional Exam:   Patient given informed consent for injection. Discussed possible complications of infection, bleeding or skin atrophy at site of injection. Possible side effect of avascular necrosis (focal area of bone death) due to steroid use.Appropriate verbal time out taken Are cleaned and prepped in usual sterile fashion. A --1-- cc kennalog plus ---4-cc 1% lidocaine without epinephrine was injected into the--right knee using anterior approach-. Patient tolerated procedure well with no complications.  Reviewed her knee xrays--these were not done weight bearing. Not a lot of DJD change and still joint space although that is inconclusive given non weight bearing status.    Impression & Recommendations:  Problem # 1:  KNEE PAIN, BILATERAL (ICD-719.46)  Orders: Joint Aspirate / Injection, Large (44034) Kenalog 10 mg inj (J3301) right knee injrction. Suspect degenerative mensicus. Briefly discussed weight loss.  Complete Medication List: 1)  Hydrochlorothiazide 25 Mg Tabs (Hydrochlorothiazide) .Marland Kitchen.. 1 tab by mouth daily 2)  Diltiazem Hcl Coated Beads 180 Mg Xr24h-cap (Diltiazem hcl coated beads) .... One daily 3)  Hydrocodone-acetaminophen 7.5-750 Mg Tabs (Hydrocodone-acetaminophen) .... One three times a day as needed pain 4)  Nexium 40 Mg Cpdr (Esomeprazole magnesium) .... One daily 5)  Flonase 50 Mcg/act Susp (Fluticasone propionate) .... Tow squirts in each nostril daily 6)  Peg 3350 Powd (Polyethylene glycol 3350) .Marland KitchenMarland Kitchen. 17 gm in 4 oz water two times a day for 3 days then daily, qs for one month 7)  Celexa 40 Mg Tabs (Citalopram hydrobromide) .... One daily 8)  Trazodone  Hcl 100 Mg Tabs (Trazodone hcl) .... One qhs 9)  Hydrocortisone 2.5 % Crea (Hydrocortisone) .... Apply to affected area daily, 30 gm tube 10)  Ibuprofen 800 Mg Tabs (Ibuprofen) .... One three times a day as needed pain 11)  Lamisil 250 Mg Tabs (Terbinafine hcl) .Marland Kitchen.. 1 tab by mouth daily x 12 weeks   Orders Added: 1)  Est. Patient  Level III [[address]] 2)  Joint Aspirate / Injection, Large [20610] 3)  Kenalog 10 mg inj [J3301]

## 2010-05-21 NOTE — Progress Notes (Signed)
Summary: Triage call  Phone Note Call from Patient   Caller: Patient Call For: [phone] Summary of Call: Rt shoulder pain/Need to be seen for an injection Initial call taken by: Abundio Miu,  February 26, 2010 11:21 AM  Follow-up for Phone Call        Returned call.  No answer.  If she calls back she can go into [facility] open afternoon slot if it is still available.  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 Follow-up by: Dennison Nancy RN,  February 26, 2010 11:40 AM

## 2010-05-21 NOTE — Progress Notes (Signed)
Summary: refill  Phone Note Refill Request Call back at Home Phone [phone] Message from:  Patient  Refills Requested: Medication #1:  HYDROCODONE-ACETAMINOPHEN 7.5-750 MG TABS one three times a day as needed pain [BMN] Initial call taken by: De Nurse,  June 25, 2009 11:52 AM    New/Updated Medications: HYDROCODONE-ACETAMINOPHEN 7.5-750 MG TABS (HYDROCODONE-ACETAMINOPHEN) one three times a day as needed pain [BMN] Prescriptions: HYDROCODONE-ACETAMINOPHEN 7.5-750 MG TABS (HYDROCODONE-ACETAMINOPHEN) one three times a day as needed pain Brand medically necessary #90 x 0   Entered and Authorized by:   Luretha Murphy NP   Signed by:   Luretha Murphy NP on 06/25/2009   Method used:   Printed then faxed to ...       [facility] (retail)       [address] [place], [place]  [address]       Ph: [phone]       Fax: [phone]   RxID:   [id number]

## 2010-05-21 NOTE — Progress Notes (Signed)
Summary: meds prob  Phone Note Call from Patient Call back at Home Phone [phone]   Caller: Patient Summary of Call: pt states that she never got the script for Lamisil and needs it called into [facility] [address]  Initial call taken by: De Nurse,  March 06, 2010 3:18 PM  Follow-up for Phone Call        it is noted that MD sent RX to [facility] and [facility] on 01/20/2010. will need to speak with patient about this.  message left to return call. Follow-up by: Theresia Lo RN,  March 06, 2010 3:40 PM  Additional Follow-up for Phone Call Additional follow up Details #1::        pt returned call Additional Follow-up by: De Nurse,  March 06, 2010 3:49 PM    Additional Follow-up for Phone Call Additional follow up Details #2::    [facility] and [facility] and patient never picked up RX from either place on 01/20/2010.  will send message to Dr. Janalyn Harder to ask if she will refill now. [facility] [address] ( health dept or outpatient pharmacy could not provide medication for patient.) Follow-up by: Theresia Lo RN,  March 06, 2010 4:07 PM  Prescriptions: LAMISIL 250 MG TABS (TERBINAFINE HCL) 1 tab by mouth daily x 12 weeks  #90 x 0   Entered and Authorized by:   Angeline Slim MD   Signed by:   Angeline Slim MD on 03/06/2010   Method used:   Electronically to        [facility] [phone]* (retail)       [address]       [place], [place]  [address]       Ph: [phone]       Fax: [phone]   RxID:   [id number]  message left for patient that RX has been sent in. Theresia Lo RN  March 06, 2010 5:19 PM

## 2010-05-21 NOTE — Assessment & Plan Note (Signed)
Summary: ? pinched nerve,tcb   Vital Signs:  Patient profile:   50 year old female Height:      68 inches Weight:      259 pounds BMI:     39.52 Temp:     98.1 degrees F oral Pulse rate:   88 / minute BP sitting:   139 / 92  (left arm) Cuff size:   large  Vitals Entered By: Tessie Fass CMA (April 23, 2009 9:47 AM) CC: bilateral hip pain x 1 week Is Patient Diabetic? No Pain Assessment Patient in pain? yes     Location: hip Intensity: 7   Primary Care Provider:  Luretha Murphy NP  CC:  bilateral hip pain x 1 week.  History of Present Illness: Norma [name] comes in today for bilateral hip/buttock pain.  Has chronic pain but has flared in last 1 week.  Feels sharp.  On both sides of buttock into hip.  Had left hip injected in November.  States it took several weeks but pain eventually improved but right started to hurt.  now both are hurting but in different way.  Pain seems to start in the buttock "like where you sit" and goes to her hips.  No numbness, tingling, weakness.  Takes chronic vicodin but wearing off too quickly over last week.  Never done PT.  Has not tried heat or ice.  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  Makes it better initially but then prolonged sitting makes it worse. No fever or new trauma.   Habits & Providers  Alcohol-Tobacco-Diet     Tobacco Status: quit  Allergies: 1)  ! Prednisone  Social History: Smoking Status:  quit  Physical Exam  General:  Obese,NAD.  Sits comfortably but then limps to exam table.  See neuro exam. vitals reviewed Msk:  negative SLR Neurologic:  alert & oriented X3 and strength normal in all extremities.   Pt was witnessed walking when being brought to exam room and gait was normal. Limps to exam table in the room.  Gets down off exam table without difficulty.   Impression & Recommendations:  Problem # 1:  HIP PAIN, BILATERAL (ICD-719.45) Assessment  New Possible flare of chronic pain.  Add muscle relaxer.  may take vicodin 1 more time daily than usual for 10 days only at lower dose and then return to normal dosing.  Refer to PT.  Advsied heat and ice.  Her updated medication list for this problem includes:    Hydrocodone-acetaminophen 7.5-750 Mg Tabs (Hydrocodone-acetaminophen) ..... One three times a day as needed pain    Naprosyn 500 Mg Tabs (Naproxen) .Marland Kitchen... Take one by mouth two times a day x 7 days then as needed pain    Flexeril 5 Mg Tabs (Cyclobenzaprine hcl) .Marland Kitchen... 1 tab by mouth q 6 hrs as needed pain    Hydrocodone-acetaminophen 5-500 Mg Tabs (Hydrocodone-acetaminophen) .Marland Kitchen... 1 tab by mouth q6 hrs as needed pain for 10 days  Orders: Physical Therapy Referral (PT) [facility]- Est  Level 4 (04540)  Complete Medication List: 1)  Hydrochlorothiazide 25 Mg Tabs (Hydrochlorothiazide) .Marland Kitchen.. 1 tab by mouth daily 2)  Diltiazem Hcl Coated Beads 180 Mg Xr24h-cap (Diltiazem hcl coated beads) .... One daily 3)  Sertraline Hcl 100 Mg Tabs (Sertraline hcl) .... One daily 4)  Hydrocortisone 2.5 % Oint (Hydrocortisone) .... Apply to affected areas three times a day.  continue 2 days  after all symptoms resolve. 5)  Hydrocodone-acetaminophen 7.5-750 Mg Tabs (Hydrocodone-acetaminophen) .... One three times a day as needed pain 6)  Topamax 100 Mg Tabs (Topiramate) .... 2 tabs of the 50 or one tab of 100 mg, 7)  Amitriptyline Hcl 50 Mg Tabs (Amitriptyline hcl) .... One tab qhs 8)  Nexium 40 Mg Cpdr (Esomeprazole magnesium) .... One daily 9)  Flonase 50 Mcg/act Susp (Fluticasone propionate) .... Tow squirts in each nostril daily 10)  Peg 3350 Powd (Polyethylene glycol 3350) .Marland KitchenMarland Kitchen. 17 gm in 4 oz water two times a day for 3 days then daily, qs for one month 11)  Naprosyn 500 Mg Tabs (Naproxen) .... Take one by mouth two times a day x 7 days then as needed pain 12)  Gabapentin 300 Mg Caps (Gabapentin) .... One in am and 2 in pm 13)  Flexeril 5 Mg Tabs  (Cyclobenzaprine hcl) .Marland Kitchen.. 1 tab by mouth q 6 hrs as needed pain 14)  Hydrocodone-acetaminophen 5-500 Mg Tabs (Hydrocodone-acetaminophen) .Marland Kitchen.. 1 tab by mouth q6 hrs as needed pain for 10 days  Patient Instructions: 1)  I'm sorry your hips/buttock are flaring on your again.  I have given you a new vicodin prescription just for this flare.  You will then have to go back to your normal dosing. 2)  Use the flexeril as well. 3)  Try heat and ice to the area. 4)  We will put in a referral to physical therapy for you and call you when that is arranged.  Prescriptions: HYDROCODONE-ACETAMINOPHEN 5-500 MG TABS (HYDROCODONE-ACETAMINOPHEN) 1 tab by mouth q6 hrs as needed pain for 10 days  #40 x 0   Entered and Authorized by:   Ardeen Garland  MD   Signed by:   Ardeen Garland  MD on 04/23/2009   Method used:   Print then Give to Patient   RxID:   [id number] FLEXERIL 5 MG TABS (CYCLOBENZAPRINE HCL) 1 tab by mouth q 6 hrs as needed pain  #40 x 2   Entered and Authorized by:   Ardeen Garland  MD   Signed by:   Ardeen Garland  MD on 04/23/2009   Method used:   Print then Give to Patient   RxID:   [id number]

## 2010-05-21 NOTE — Assessment & Plan Note (Signed)
Summary: shoulder pain,df   Vital Signs:  Patient profile:   50 year old female Height:      68 inches Weight:      254 pounds BMI:     38.76 Pulse rate:   74 / minute BP sitting:   112 / 68  (right arm) Cuff size:   large  Vitals Entered By: Tessie Fass CMA (February 26, 2010 2:47 PM) CC: right shoulder pain Pain Assessment Patient in pain? yes     Location: right shoulder Intensity: 9   Primary Provider:  Luretha Murphy NP  CC:  right shoulder pain.  History of Present Illness: Pt complains of right shoulder pain x1 1/2 months.  Initially saw Darl Pikes (regular PCP) for knee pain and wanted injection, but per pt, Darl Pikes didn't want to.  [facility].  Pt states that over the past 1 1/2 months, she has been having pain, all the time, in her shoulder that may or may not radiate to the elbow. Burning in nature. Constant intesnity of 8-9/10.  She can't sleep at night because of the pain and her job invovles having to lift patients, which is very difficult with the pain.  No numbess or weakness in her hand.  Feels like there is some weakness in her shoulder but has been able to do all ADLs and work requirements.  No swelling, erythema, or warmth around the shoulder joint. No fevers. No other systemic symptoms.  Of note, pt feels that the joint injection in her knee helped for a few days and she starts rehab for her knees on Monday.  She is hoping to be able to have rehab/PT also for her shoulder and back but is not sure how her PT prescription is written.   Current Problems (verified): 1)  Knee Pain, Bilateral  (ICD-719.46) 2)  Migraine Headache  (ICD-346.90) 3)  Fibroids, Uterus  (ICD-218.9) 4)  Postmenopausal Bleeding  (ICD-627.1) 5)  Gerd  (ICD-530.81) 6)  Constipation  (ICD-564.00) 7)  Chronic Pain Syndrome  (ICD-338.4) 8)  Insomnia  (ICD-780.52) 9)  Hip Pain, Left, Chronic  (ICD-719.45) 10)  Hepatitis C Carrier  (ICD-V02.62) 11)   Tachycardia, Paroxysmal Supraventricular  (ICD-427.0) 12)  Osteoarthritis of Spine, Nos  (ICD-721.90) 13)  Obesity, Nos  (ICD-278.00) 14)  Hypertension, Benign Systemic  (ICD-401.1) 15)  Depression, Major, Recurrent  (ICD-296.30) 16)  Back Pain W/radiation, Unspecified  (ICD-724.4)  Current Medications (verified): 1)  Hydrochlorothiazide 25 Mg  Tabs (Hydrochlorothiazide) .Marland Kitchen.. 1 Tab By Mouth Daily 2)  Diltiazem Hcl Coated Beads 180 Mg Xr24h-Cap (Diltiazem Hcl Coated Beads) .... One Daily 3)  Hydrocodone-Acetaminophen 7.5-750 Mg Tabs (Hydrocodone-Acetaminophen) .... One Three Times A Day As Needed Pain 4)  Nexium 40 Mg Cpdr (Esomeprazole Magnesium) .... One Daily 5)  Flonase 50 Mcg/act Susp (Fluticasone Propionate) .... Tow Squirts in Each Nostril Daily 6)  Peg 3350  Powd (Polyethylene Glycol 3350) .Marland KitchenMarland KitchenMarland Kitchen 17 Gm in 4 Oz Water Two Times A Day For 3 Days Then Daily, Qs For One Month 7)  Celexa 40 Mg Tabs (Citalopram Hydrobromide) .... One Daily 8)  Trazodone Hcl 100 Mg Tabs (Trazodone Hcl) .... One Qhs 9)  Hydrocortisone 2.5 % Crea (Hydrocortisone) .... Apply To Affected Area Daily, 30 Gm Tube 10)  Ibuprofen 800 Mg Tabs (Ibuprofen) .... One Three Times A Day As Needed Pain 11)  Lamisil 250 Mg Tabs (Terbinafine Hcl) .Marland Kitchen.. 1 Tab By Mouth Daily X 12 Weeks  Allergies: 1)  !  Prednisone 2)  * Nortryptyline  Physical Exam  General:  alert, well-developed, and well-nourished.   Lungs:  normal respiratory effort and normal breath sounds.   Heart:  normal rate and regular rhythm.   Abdomen:  soft, non-tender, and normal bowel sounds.   Msk:  Normal strength throughout.  Pain w/ lowering R arm lower than 90 degrees.  Pain with resisting downward pushing (pain with pushing up with arm at 90 degrees).  No pain in right arm or shoulder w/ pulling or pushing w/ forearm or pushing down w/ arm at 90 degrees. + point tenderness over top of shoulder, posterior, where rotator cuff inserts. No tenderness in  anterior shoulder.  Full active and passive ROM. Pulses:  2+ pedal pulses Neurologic:  alert & oriented X3.   No decreased sensation or strength in L or R upper ext.    Impression & Recommendations:  Problem # 1:  ROTATOR CUFF SYNDROME, RIGHT (ICD-726.10) Assessment New Pain and point tenderness most consistent with rotator cuff syndrome/tendonitis.  Will recommend scheduled ibuprofen 800mg  three times a day, as well as ice x 15 min three times a day and limiting activities such as lifting as much as possible to start.  Pt is to get an xray of right shoulder in order to prepare for a joint injection.  Told pt to come back in 1-2 weeks if still in pain so that she can get a joint injection to help with pain.  When she returns, she will also know if she needs a PT script for her shoulder to help with strengthening exercises.  If fails all of these therapies, will consider getting MRI to assess for partial tear.   Orders: T-DG Shoulder IV*R* (73020) [facility]- Est Level  2 ([address]109)  Complete Medication List: 1)  Hydrochlorothiazide 25 Mg Tabs (Hydrochlorothiazide) .Marland Kitchen.. 1 tab by mouth daily 2)  Diltiazem Hcl Coated Beads 180 Mg Xr24h-cap (Diltiazem hcl coated beads) .... One daily 3)  Hydrocodone-acetaminophen 7.5-750 Mg Tabs (Hydrocodone-acetaminophen) .... One three times a day as needed pain 4)  Nexium 40 Mg Cpdr (Esomeprazole magnesium) .... One daily 5)  Flonase 50 Mcg/act Susp (Fluticasone propionate) .... Tow squirts in each nostril daily 6)  Peg 3350 Powd (Polyethylene glycol 3350) .Marland KitchenMarland Kitchen. 17 gm in 4 oz water two times a day for 3 days then daily, qs for one month 7)  Celexa 40 Mg Tabs (Citalopram hydrobromide) .... One daily 8)  Trazodone Hcl 100 Mg Tabs (Trazodone hcl) .... One qhs 9)  Hydrocortisone 2.5 % Crea (Hydrocortisone) .... Apply to affected area daily, 30 gm tube 10)  Ibuprofen 800 Mg Tabs (Ibuprofen) .... One three times a day as needed pain 11)  Lamisil 250 Mg Tabs (Terbinafine hcl)  .Marland Kitchen.. 1 tab by mouth daily x 12 weeks  Patient Instructions: 1)  It was nice meeting you! I'm so sorry that your shoulder has been bothering you. 2)  Please take ibuprofen 800mg  three times a day with meals and ice your shoulder for 15 mins three times a day.  Also try to decrease the amount of lifting you have to do. 3)  Go get your shoulder xray and make an appt to come back in 1-2 weeks for a joint injection.  If my schedule is full, you can schedule with the procedure clinic or with any of the other residents (Dr. Karie Schwalbe or Dr. Katrinka Blazing are really great options). Prescriptions: TRAZODONE HCL 100 MG TABS (TRAZODONE HCL) one qhs  #30 x 6  Entered and Authorized by:   Demetria Pore MD   Signed by:   Demetria Pore MD on 03/01/2010   Method used:   Electronically to        [facility]* (retail)       [address]       [address] Shipping/mailing       [place], [place]  [address]       Ph: [phone]       Fax: [phone]   RxID:   [id number] IBUPROFEN 800 MG TABS (IBUPROFEN) one three times a day as needed pain  #30 x 6   Entered and Authorized by:   Demetria Pore MD   Signed by:   Demetria Pore MD on 03/01/2010   Method used:   Electronically to        [facility]* (retail)       [address]       [address] Shipping/mail[address]       [place], [place]  [address]       Ph: [phone]       Fax: [phone]   RxID:   [id number]    Orders Added: 1)  T-DG Shoulder IV*R* [73020] 2)  [facility]- Est Level  2 [01093]
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
## 2010-05-21 NOTE — Progress Notes (Signed)
Summary: medication  Phone Note Call from Patient Call back at Home Phone [phone]   Reason for Call: Talk to Doctor Summary of Call: pt is calling back, she still needs diltezem filled, she said it was her heart medication Initial call taken by: ERIN LEVAN,  July 19, 2006 2:19 PM   Pt contacted and dose of cardiazem verified 180 mg LA daily.  Will refil via [facility]first with substitution permitted.

## 2010-05-21 NOTE — Progress Notes (Signed)
Summary: triage  Phone Note Call from Patient Call back at Home Phone [phone]   Caller: Patient Summary of Call: still having migrain and wants to come in today - didn't go to [facility] Initial call taken by: De Nurse,  December 17, 2009 11:25 AM  Follow-up for Phone Call        she will see Dr. Earlene Plater this pm as md had a cancellation at that time Follow-up by: Golden Circle RN,  December 17, 2009 11:29 AM
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
## 2010-05-21 NOTE — Progress Notes (Signed)
Summary: Rx Prob  Phone Note Call from Patient Call back at Home Phone [phone]   Caller: Patient Summary of Call: Pt had her medication changed on Friday for her nerves and it is making her very drunk feeling. Initial call taken by: Clydell Hakim,  August 04, 2009 12:28 PM  Follow-up for Phone Call        will forward  message to Luretha Murphy Follow-up by: Theresia Lo RN,  August 04, 2009 1:28 PM  Additional Follow-up for Phone Call Additional follow up Details #1::        Stop Depakote (carbanzepine), do not drive for a least 24 hours.  Disucssed that I feel that her mental health issues are beyond my scope and asked her to call mental health today.  I will call her tomorrow and check on her. Additional Follow-up by: Luretha Murphy NP,  August 04, 2009 1:35 PM

## 2010-05-21 NOTE — Assessment & Plan Note (Signed)
Summary: arm still itching/Norma [name]/Norma [name]   Vital Signs:  Patient profile:   50 year old female Height:      68 inches Weight:      267 pounds Temp:     97.4 degrees F oral Pulse rate:   84 / minute BP sitting:   138 / 82  (left arm) Cuff size:   large  Vitals Entered By: Arlyss Repress CMA, (July 04, 2009 3:21 PM) CC: itchy rash on arms. used cortisone 2.5%. not helping., Lipid Management Is Patient Diabetic? No Pain Assessment Patient in pain? no        Primary Care Provider:  Luretha Murphy NP  CC:  itchy rash on arms. used cortisone 2.5%. not helping. and Lipid Management.  History of Present Illness: CC: continued itchy rash  1 mo history of itchy rash bilateral hands and forearms, treated with 2.5% hydrocortisone cream 3 times a day, took like that x 1 month and no change.  Initially was also on chest, but that has gotten better.  No other skin changes, + always hot.  s/p menopause last year.  + weight gain 240 - 267 in last few months.  No family history of thyroid issues.  No change in soaps or detergents.  Uses intensive care lotion.  No moisturizer.  Asked about PREDNISONE allergy - patient denies.   Habits & Providers  Alcohol-Tobacco-Diet     Tobacco Status: never  Allergies (verified): 1)  ! Prednisone 2)  * Nortryptyline PMH-FH-SH reviewed for relevance  Social History: Smoking Status:  never  Physical Exam  General:  overweight appearing,in no acute distress; alert,appropriate and cooperative throughout examination Skin:  small dry skin patches bilateral dorsal hands and forearms.  pruritic.  steroids on board.   Impression & Recommendations:  Problem # 1:  RASH AND OTHER NONSPECIFIC SKIN ERUPTION (ICD-782.1)  ? eczema.  Increase therapy to more potent steroid.  Treat BID x 2 wks, return in 3 wks for re evaluation, biopsy if not improved (once 1 wk off steroids).  Recommended moisturize moisturize moisturize.  Does have all children with eczema.  Not  typical nummular eczema but steroids on board.  check TSH.  given concomitant weight gain.  Her updated medication list for this problem includes:    Triamcinolone Acetonide 0.5 % Crea (Triamcinolone acetonide) .Marland Kitchen... Apply to aa two times a day x 2 wks.  large op  Orders: TSH-[facility] ([id number]) [facility]- Est Level  3 ([address])  Complete Medication List: 1)  Hydrochlorothiazide 25 Mg Tabs (Hydrochlorothiazide) .Marland Kitchen.. 1 tab by mouth daily 2)  Diltiazem Hcl Coated Beads 180 Mg Xr24h-cap (Diltiazem hcl coated beads) .... One daily 3)  Sertraline Hcl 100 Mg Tabs (Sertraline hcl) .... One daily 4)  Triamcinolone Acetonide 0.5 % Crea (Triamcinolone acetonide) .... Apply to aa two times a day x 2 wks.  large op 5)  Hydrocodone-acetaminophen 7.5-750 Mg Tabs (Hydrocodone-acetaminophen) .... One three times a day as needed pain (may fill early) 6)  Topamax 100 Mg Tabs (Topiramate) .... 2 tabs of the 50 or one tab of 100 mg, 7)  Amitriptyline Hcl 50 Mg Tabs (Amitriptyline hcl) .... One tab qhs 8)  Nexium 40 Mg Cpdr (Esomeprazole magnesium) .... One daily 9)  Flonase 50 Mcg/act Susp (Fluticasone propionate) .... Tow squirts in each nostril daily 10)  Peg 3350 Powd (Polyethylene glycol 3350) .Marland KitchenMarland Kitchen. 17 gm in 4 oz water two times a day for 3 days then daily, qs for one month 11)  Gabapentin 300 Mg Caps (  Gabapentin) .... One in am and 2 in pm 12)  Flexeril 5 Mg Tabs (Cyclobenzaprine hcl) .Marland Kitchen.. 1 tab by mouth q 6 hrs as needed pain 13)  Diclofenac Sodium 75 Mg Tbec (Diclofenac sodium) .... One tab two times a day  Patient Instructions: 1)  RTC 3 wks.  2)  This still looks like eczema type rash.  Treat with stronger steroid twice daily for 2 weeks.  (Stop hydrocortisone) 3)  Moisturize throughout day (cream or ointment, not lotion).  Aveeno and Eucerin have good moisturizers. 4)  Return off steroids for several days if not improving and we could consider biopsy. 5)  We have checked thyroid function  today. Prescriptions: TRIAMCINOLONE ACETONIDE 0.5 % CREA (TRIAMCINOLONE ACETONIDE) apply to AA two times a day x 2 wks.  large OP  #1 x 0   Entered and Authorized by:   Eustaquio Boyden  MD   Signed by:   Eustaquio Boyden  MD on 07/04/2009   Method used:   Faxed to ...       [facility] (retail)       [address] [place], [place]  [address]       Ph: [phone]       Fax: [phone]   RxID:   [id number]

## 2010-05-21 NOTE — Miscellaneous (Signed)
Summary: GYN clinic note  Clinical Lists Changes  NAME:  Norma [name], Norma [name]                ACCOUNT NO.:  [id number]      MEDICAL RECORD NO.:  [id number]          PATIENT TYPE:  WOC      LOCATION:  [facility]                   FACILITY:  [facility]      PHYSICIAN:  Argentina Donovan, MD        DATE OF BIRTH:  1960-06-05      DATE OF SERVICE:  11/26/2009                                     CLINIC NOTE      The patient is a 50 year old African American gravida 6, para 4-0-2-4   who has been followed by the [facility] and has been   bothered by some upper gastrointestinal symptoms.  She has got an   ultrasound of the upper abdomen that showed no abnormality and of the   pelvis which showed some tiny fibroids and a very small uterus, but   certainly one fibroid at least is compromising the visualization of the   endometrium.  The patient who is extremely large weighing 263 and is 5   feet 6 inches tall, had 2 years of amenorrhea with what she is having   hot flashes followed by an episode of heavy bleeding over a period of a   week for which she was treated with Provera and passage of clots.  Prior   to the onset of the bleeding, she said she noticed that her hot flashes   restarted up again after the bleeding.  My feeling is in this large   patient that although she could have had amenorrhea just because of her   weight, I think she probably is truly menopause on this as basket   syndrome.  I am going to get some test for [facility] and LH on her should that   endometrial biopsy.  She has had a recent Pap smear, so just an   endometrial biopsy sounding the uterus to 8-cm anteriorly and had a   successful biopsy without incident.  I am going to have her come back in   2 weeks to check on that and also the [facility] and LH and then be able to   counsel her as to what to expect in the future.      IMPRESSION:  Postmenopausal bleeding rule out endometrial hyperplasia.            ______________________________   Argentina Donovan, MD            PR/MEDQ  D:  11/26/2009  T:  11/27/2009  Job:  [id number]

## 2010-05-21 NOTE — Progress Notes (Signed)
  Phone Note Call from Patient   Caller: Patient Summary of Call: Pt went to gyn at [facility] but wasn't pleased with physician's results.  Would like a second opinion for problem. Please call her at [phone] Initial call taken by: Britta Mccreedy [email or web address]

## 2010-05-21 NOTE — Progress Notes (Signed)
Summary: Rx  Phone Note Refill Request Call back at [phone]   Refills Requested: Medication #1:  HYDROCODONE-ACETAMINOPHEN 7.5-750 MG TABS one three times a day as needed pain pt sts she was told this could be filled on Friday since her due date falls on the weekend. Pt also wants to be sure this will be sent electronically to the outpatient pharmacy.  Initial call taken by: Knox Royalty,  March 25, 2010 10:27 AM    Prescriptions: HYDROCODONE-ACETAMINOPHEN 7.5-750 MG TABS (HYDROCODONE-ACETAMINOPHEN) one three times a day as needed pain  #90 x 0   Entered and Authorized by:   Luretha Murphy NP   Signed by:   Luretha Murphy NP on 03/25/2010   Method used:   Printed then faxed to ...       [facility]* (retail)       [address]       [address] Shipping/mailing       [place], [place]  [address]       Ph: [phone]       Fax: [phone]   RxID:   [id number]

## 2010-05-21 NOTE — Progress Notes (Signed)
Summary: triage  Phone Note Call from Patient Call back at [phone]   Caller: Patient Summary of Call: has ingrown toenail that has puss in it.  has appt for flu shot this afternoon and wants to be seen, Initial call taken by: De Nurse,  January 19, 2010 10:31 AM  Follow-up for Phone Call        toe problem x 1 month. hx of this.  taking NSAIDS with little relief. appt tomorrow am . no appts left for today & unable to come in to see pcp at time available. appt to see Dr. Janalyn Harder tomorrow am she refused to use UC Follow-up by: Golden Circle RN,  January 19, 2010 10:42 AM  Additional Follow-up for Phone Call Additional follow up Details #1::        noted Additional Follow-up by: Luretha Murphy NP,  January 19, 2010 11:48 AM

## 2010-05-21 NOTE — Assessment & Plan Note (Signed)
Summary: back pain,df   Vital Signs:  Patient profile:   50 year old female Weight:      261.5 pounds Pulse rate:   92 / minute BP sitting:   126 / 85  (right arm)  Vitals Entered By: Renato Battles slade,cma CC: back pain after lifting heavy person... Is Patient Diabetic? No Pain Assessment Patient in pain? yes     Location: lower back Intensity: 9 Onset of pain  x 1 week   Primary Care Provider:  Luretha Murphy NP  CC:  back pain after lifting heavy person....  History of Present Illness: Continued back pain, using up her pain meds quickly.  She describes pain as so severe that if she stands to cook dinner for a few hours that the next day she can barely move.  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  She demonstrated her body posture and she is bending and lifting and pulling, she is not using her legs for leverage.  She is certified by the hospital and physical therapy is part of the benefit; she is open to learn proper body mechanics.  She needs to continue to work and desires to do so.  Habits & Providers  Alcohol-Tobacco-Diet     Tobacco Status: never  Current Medications (verified): 1)  Hydrochlorothiazide 25 Mg  Tabs (Hydrochlorothiazide) .Marland Kitchen.. 1 Tab By Mouth Daily 2)  Diltiazem Hcl Coated Beads 180 Mg Xr24h-Cap (Diltiazem Hcl Coated Beads) .... One Daily 3)  Hydrocodone-Acetaminophen 7.5-750 Mg Tabs (Hydrocodone-Acetaminophen) .... One Three Times A Day As Needed Pain 4)  Nexium 40 Mg Cpdr (Esomeprazole Magnesium) .... One Daily 5)  Flonase 50 Mcg/act Susp (Fluticasone Propionate) .... Tow Squirts in Each Nostril Daily 6)  Peg 3350  Powd (Polyethylene Glycol 3350) .Marland KitchenMarland KitchenMarland Kitchen 17 Gm in 4 Oz Water Two Times A Day For 3 Days Then Daily, Qs For One Month 7)  Celexa 40 Mg Tabs (Citalopram Hydrobromide) .... One Daily 8)  Trazodone Hcl 100 Mg Tabs (Trazodone Hcl) ....  One Qhs 9)  Hydrocortisone 2.5 % Crea (Hydrocortisone) .... Apply To Affected Area Daily, 30 Gm Tube 10)  Ibuprofen 800 Mg Tabs (Ibuprofen) .... One Three Times A Day As Needed Pain 11)  Provera 5 Mg Tabs (Medroxyprogesterone Acetate) .... One Daily (Until Seen By Gyn)  Allergies (verified): 1)  ! Prednisone 2)  * Nortryptyline  Review of Systems General:  Denies chills and fever. GU:  Denies dysuria. MS:  Complains of low back pain.  Physical Exam  General:  Alert, obese-especially central obesity Msk:  good flexion of lower back, poor muscle tone of paravetebral and abdominal muscles, lack of lower back extension.  Legs 5/5    Impression & Recommendations:  Problem # 1:  BACK PAIN W/RADIATION, UNSPECIFIED (ICD-724.4)  Chronic, documeneted DJD and stenosis on MRI.  Has been using narcotics to remain active and continue to work.  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 Her updated medication list for this problem includes:    Hydrocodone-acetaminophen 7.5-750 Mg Tabs (Hydrocodone-acetaminophen) ..... One three times a day as needed pain    Ibuprofen 800 Mg Tabs (Ibuprofen) ..... One three times a day as needed pain  Orders: [facility]- Est Level  3 ([address])  Problem # 2:  CONSTIPATION (ICD-564.00) Using PEG and not constipated dispite using narcotics Her updated medication  list for this problem includes:    Peg 3350 Powd (Polyethylene glycol 3350) .Marland KitchenMarland KitchenMarland KitchenMarland Kitchen 17 gm in 4 oz water two times a day for 3 days then daily, qs for one month  Complete Medication List: 1)  Hydrochlorothiazide 25 Mg Tabs (Hydrochlorothiazide) .Marland Kitchen.. 1 tab by mouth daily 2)  Diltiazem Hcl Coated Beads 180 Mg Xr24h-cap (Diltiazem hcl coated beads) .... One daily 3)  Hydrocodone-acetaminophen 7.5-750 Mg Tabs (Hydrocodone-acetaminophen) .... One three times a day as needed pain 4)  Nexium 40 Mg Cpdr (Esomeprazole magnesium) .... One daily 5)  Flonase 50 Mcg/act Susp (Fluticasone  propionate) .... Tow squirts in each nostril daily 6)  Peg 3350 Powd (Polyethylene glycol 3350) .Marland KitchenMarland Kitchen. 17 gm in 4 oz water two times a day for 3 days then daily, qs for one month 7)  Celexa 40 Mg Tabs (Citalopram hydrobromide) .... One daily 8)  Trazodone Hcl 100 Mg Tabs (Trazodone hcl) .... One qhs 9)  Hydrocortisone 2.5 % Crea (Hydrocortisone) .... Apply to affected area daily, 30 gm tube 10)  Ibuprofen 800 Mg Tabs (Ibuprofen) .... One three times a day as needed pain 11)  Provera 5 Mg Tabs (Medroxyprogesterone acetate) .... One daily (until seen by gyn)

## 2010-05-21 NOTE — Assessment & Plan Note (Signed)
Summary: leg cramps,df   Vital Signs:  Patient profile:   50 year old female Height:      68 inches Weight:      256.5 pounds BMI:     39.14 Temp:     97.6 degrees F oral Pulse rate:   96 / minute BP sitting:   122 / 77  (left arm) Cuff size:   large  Vitals Entered By: Garen Grams LPN (May 06, 2010 2:04 PM) CC: cramps all over x 3 months Is Patient Diabetic? No   Primary Care Provider:  Luretha Murphy NP  CC:  cramps all over x 3 months.  History of Present Illness: 1. Cramps:  Pt has had cramps in various limbs over the past 3 months.  It started in her feet and legs and then went to her ribs, and then to her arms.  The cramp is described like her muscle is tightening up and it hurts.  The pain lasts for a couple of minutes but then goes away on its own.  It is relieved with strectching and resting.  When it happens pain is rated a 10/10.  Pt denies any current cramps.  Pt tried taking OTC potassium supplement and something called "leg cramps" neither which helped.  She only drinks about 2 bottles of water a day and it is always flavored with a packet.  ROS: denies fevers, rash, joint pain  2. Right ankle pain:  Pt is s/p surgery on her right ankle with pins placed in it many years ago.  She has been fine up until the past couple of months.  She thinks that "it feels like the pins are lose".  She has constant ankle pain.  It is worse when walking.  Improved with pain medications.  ROS: denies joint swelling or redness  Habits & Providers  Alcohol-Tobacco-Diet     Alcohol drinks/day: 0     Tobacco Status: never     Year Quit: 1980  Current Medications (verified): 1)  Hydrochlorothiazide 25 Mg  Tabs (Hydrochlorothiazide) .Marland Kitchen.. 1 Tab By Mouth Daily 2)  Diltiazem Hcl Coated Beads 180 Mg Xr24h-Cap (Diltiazem Hcl Coated Beads) .... One Daily 3)  Hydrocodone-Acetaminophen 7.5-750 Mg Tabs (Hydrocodone-Acetaminophen) .... One Three Times A Day As Needed Pain 4)  Nexium 40 Mg Cpdr  (Esomeprazole Magnesium) .... One Daily 5)  Flonase 50 Mcg/act Susp (Fluticasone Propionate) .... Tow Squirts in Each Nostril Daily 6)  Peg 3350  Powd (Polyethylene Glycol 3350) .Marland KitchenMarland KitchenMarland Kitchen 17 Gm in 4 Oz Water Two Times A Day For 3 Days Then Daily, Qs For One Month 7)  Celexa 40 Mg Tabs (Citalopram Hydrobromide) .... One Daily 8)  Trazodone Hcl 100 Mg Tabs (Trazodone Hcl) .... One Qhs 9)  Hydrocortisone 2.5 % Crea (Hydrocortisone) .... Apply To Affected Area Daily, 30 Gm Tube 10)  Lamisil 250 Mg Tabs (Terbinafine Hcl) .Marland Kitchen.. 1 Tab By Mouth Daily X 12 Weeks 11)  Tramadol-Acetaminophen 37.5-325 Mg Tabs (Tramadol-Acetaminophen) .... 2 Tabs Three Times A Day As Needed Pain 12)  Ketoprofen  Powd (Ketoprofen) .... Please Compound Into Paste For Topical Application Qid To Right Ac Joint.  Allergies: 1)  ! Prednisone 2)  * Nortryptyline  Past History:  Past Medical History: Reviewed history from 06/09/2009 and no changes required. HepC dx`d 5/02 Positive Rheumatoid Factor (2/02) PSVT-followed by [facility]ffer 06/2002 PTSD rectal fissure via anoscopy 08/25/05 Hbg 10.3, stool studies neg 08/25/05/ Hemeoccult neg 09/03/05   MRI spine--L4-L5 disk bulge,L5-S1 disk hern. - 6/7/2002T-spine/L5,S1 - 05/24/2000  X-ray R thumb - fracture of volar plate (avulsion) - Xray - Spine(2/01) anterior osteophytes  Treadmill test 02/0 Depression GERD Hypertension  Social History: Reviewed history from 06/09/2009 and no changes required. Son= Jayquan b. 1999  Daughter--Shaquita.; Husband murdered 6/01.  No tob, EtoH, drugs. No drug use.  No previous blood transfusions.  Has a son who is incarcerated 10/2004.    Best contact #: [phone] (cell)  Physical Exam  General:  Vitals reviewed.  Obese.  NAD Lungs:  normal respiratory effort and normal breath sounds.   Heart:  normal rate and regular rhythm.   Msk:  Right ankle: TTP over lateral malleolus where pins where placed.  Pin is palpable under skin.  Decreased ROM.  No joint  swelling or redness.   Extremities:  normal muscle tone.  Neurologic:  5/5 strength in all extremities.  Normal sensation. Skin:  no suspicious lesions.   Psych:  not anxious appearing and not depressed appearing.     Impression & Recommendations:  Problem # 1:  CRAMP IN LIMB (ICD-729.82) Assessment New Will check electrolytes, Mg, and TSH.  Advised her to drink more plain water because she is likely chronically dehydrated with the use of HCTZ.  She may benefit from switching antihypertensives if cramping persists. Orders: Comp Met-[facility] [id number]) Magnesium-[facility] [id number]) TSH-[facility] [id number]) [facility]- Est  Level 4 ([address])  Problem # 2:  ANKLE PAIN, RIGHT (ICD-719.47) Assessment: Deteriorated Patient in s/p surgery.  Will get x-rays to evaluate pin placement.  Advised patient to schedule a follow up appointment with the orthopedic surgeon but she said that it is difficult because she doesn't have insurance.   Orders: Diagnostic X-Ray/Fluoroscopy (Diagnostic X-Ray/Flu) [facility]- Est  Level 4 (24401)  Complete Medication List: 1)  Hydrochlorothiazide 25 Mg Tabs (Hydrochlorothiazide) .Marland Kitchen.. 1 tab by mouth daily 2)  Diltiazem Hcl Coated Beads 180 Mg Xr24h-cap (Diltiazem hcl coated beads) .... One daily 3)  Hydrocodone-acetaminophen 7.5-750 Mg Tabs (Hydrocodone-acetaminophen) .... One three times a day as needed pain 4)  Nexium 40 Mg Cpdr (Esomeprazole magnesium) .... One daily 5)  Flonase 50 Mcg/act Susp (Fluticasone propionate) .... Tow squirts in each nostril daily 6)  Peg 3350 Powd (Polyethylene glycol 3350) .Marland KitchenMarland Kitchen. 17 gm in 4 oz water two times a day for 3 days then daily, qs for one month 7)  Celexa 40 Mg Tabs (Citalopram hydrobromide) .... One daily 8)  Trazodone Hcl 100 Mg Tabs (Trazodone hcl) .... One qhs 9)  Hydrocortisone 2.5 % Crea (Hydrocortisone) .... Apply to affected area daily, 30 gm tube 10)  Lamisil 250 Mg Tabs (Terbinafine hcl) .Marland Kitchen.. 1 tab by mouth daily x 12 weeks 11)   Tramadol-acetaminophen 37.5-325 Mg Tabs (Tramadol-acetaminophen) .... 2 tabs three times a day as needed pain 12)  Ketoprofen Powd (Ketoprofen) .... Please compound into paste for topical application qid to right ac joint.  Patient Instructions: 1)  It was nice to meet you today 2)  I am going to check your electrolytes, Magnesium, and Thyroid to try and figure out why you are having the cramps 3)  If they are all normal you are likely dehydrated and need to be drinking more plain water (at least 4 bottles a day) 4)  We will also send you for an x-ray of your ankle to make sure the screws aren't out of place 5)  Please schedule a follow up appointment with Luretha Murphy in 4-6 weeks to check on the cramps   Orders Added: 1)  Diagnostic X-Ray/Fluoroscopy [Diagnostic X-Ray/Flu] 2)  Comp Met-[facility] [[id number]] 3)  Magnesium-[facility] [[id number]] 4)  TSH-[facility] [[id number]] 5)  [facility]- Est  Level 4 [[address]562]

## 2010-05-21 NOTE — Assessment & Plan Note (Signed)
Summary: joint injection per mcgill/eo   Vital Signs:  Patient profile:   50 year old female Weight:      248.6 pounds Temp:     98.4 degrees F oral Pulse rate:   72 / minute Pulse rhythm:   regular BP sitting:   125 / 79  (left arm) Cuff size:   large  Vitals Entered By: Loralee Pacas CMA (March 09, 2010 10:32 AM) CC: right shoulder inj Is Patient Diabetic? No   Primary Care Provider:  Luretha Murphy NP  CC:  right shoulder inj.  History of Present Illness: 50 yo female here with several weeks shoulder pain.  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  No weakness.  Pain localized over [facility] joint however.  Better with ibuprofen, in fact approx 50% better.  Pain is sharp/burning.  doesn't radiate to hand however occasionally radiates as far as elbow, no neck pain.  She had a 1 view XR that was unremarkable with the exception of what appears to me to be a widened AC joint, not commented on by radiologist.    Habits & Providers  Alcohol-Tobacco-Diet     Alcohol drinks/day: 0     Tobacco Status: never     Year Quit: 1980  Exercise-Depression-Behavior     Have you felt down or hopeless? no     Have you felt little pleasure in things? yes     Depression Counseling: further diagnostic testing and/or other treatment is indicated     Seat Belt Use: always  Current Medications (verified): 1)  Hydrochlorothiazide 25 Mg  Tabs (Hydrochlorothiazide) .Marland Kitchen.. 1 Tab By Mouth Daily 2)  Diltiazem Hcl Coated Beads 180 Mg Xr24h-Cap (Diltiazem Hcl Coated Beads) .... One Daily 3)  Hydrocodone-Acetaminophen 7.5-750 Mg Tabs (Hydrocodone-Acetaminophen) .... One Three Times A Day As Needed Pain 4)  Nexium 40 Mg Cpdr (Esomeprazole Magnesium) .... One Daily 5)  Flonase 50 Mcg/act Susp (Fluticasone Propionate) .... Tow Squirts in Each Nostril Daily 6)  Peg 3350  Powd (Polyethylene Glycol 3350) .Marland KitchenMarland KitchenMarland Kitchen 17 Gm in 4  Oz Water Two Times A Day For 3 Days Then Daily, Qs For One Month 7)  Celexa 40 Mg Tabs (Citalopram Hydrobromide) .... One Daily 8)  Trazodone Hcl 100 Mg Tabs (Trazodone Hcl) .... One Qhs 9)  Hydrocortisone 2.5 % Crea (Hydrocortisone) .... Apply To Affected Area Daily, 30 Gm Tube 10)  Mobic 7.5 Mg Tabs (Meloxicam) .... One Tab By Mouth 1-2 Times A Day For Pain. 11)  Lamisil 250 Mg Tabs (Terbinafine Hcl) .Marland Kitchen.. 1 Tab By Mouth Daily X 12 Weeks 12)  Keflex 500 Mg Caps (Cephalexin) .... One By Mouth Two Times A Day X 3d  Allergies (verified): 1)  ! Prednisone 2)  * Nortryptyline  Social History: Risk analyst Use:  always  Review of Systems       See HPI  Physical Exam  General:  Well-developed,well-nourished,in no acute distress; alert,appropriate and cooperative throughout examination Msk:  R Shoulder: Inspection reveals no abnormalities, atrophy or asymmetry. Palpation is normal with no tenderness over bicipital groove. SHE DOES HAVE SIGNIFICANT TENDERNESS OVER THE AC JOINT ROM is full in all planes. Rotator cuff strength normal throughout. No signs of impingement with negative Neer and Hawkin's tests, empty can. Speeds and Yergason's tests normal. No labral pathology noted with negative Obrien's, negative clunk and good stability. Normal scapular function observed. No painful arc and  no drop arm sign. No apprehension sign  STRONGLY POSITIVE CROSS ARM TEST Additional Exam:  R shoulder 1 view xray independently reviewed by me and noted increase AC joint space suggestive of grade 2 injury.  MSK US performed:  Noted some distension of R AC joint capsule with surrounding edema.  The L side is unremarkable with an undistended capsule.  Images taken as well of R supraspinatus, impingement view, no bursablades seen and no bulge in bursa with active and passive abduction.  Images saved.  Ultrasound guided diagnostic R AC joint injection Consent obtained and verified. Cleansed with  alcohol. Topical analgesic spray: Ethyl chloride. Joint: R AC joint Approached in typical fashion with: needle adjacent to ultrasound probe. Needle seen to dip into [facility] joint on active US guidance.  1.5cc 1% lidocaine injected into joint with visible distension of the capsule. Completed without difficulty Meds: 1.5 cc lidocaine 1% Needle: 25g Aftercare instructions and Red flags advised. Pain immediately resolved after injection.  Cross arm sign negative after injection.     Impression & Recommendations:  Problem # 1:  SHOULDER PAIN, RIGHT (ICD-719.41) Assessment New Symptoms and imaging suggestive of AC joint pathology without traumatic injury. US guided diagnostic injection with immediate resolution of symptoms after injection suggests R acromioclavicular joint injury. This would suggest that con't PT with mobilization may not be helpful as it is with rotator cuff pathology. Sling given and fitted. Pt should wear sling for no more than 2 weeks. Avoid excessive movement of the arm for now. Mobic for pain. Keflex x3d as adjuvant after injection. RTC as needed.  Her updated medication list for this problem includes:    Mobic 7.5 Mg Tabs (Meloxicam) ..... One tab by mouth 1-2 times a day for pain.  Orders: [facility]- Est  Level 4 (99214) [place] LIMITED (04540) [place] NDL PLMT IMG S&I (98119) Injection, intermediate joint - [facility] (14782) Arm Sling Universal (N5621)  Complete Medication List: 1)  Hydrochlorothiazide 25 Mg Tabs (Hydrochlorothiazide) .Marland Kitchen.. 1 tab by mouth daily 2)  Diltiazem Hcl Coated Beads 180 Mg Xr24h-cap (Diltiazem hcl coated beads) .... One daily 3)  Hydrocodone-acetaminophen 7.5-750 Mg Tabs (Hydrocodone-acetaminophen) .... One three times a day as needed pain 4)  Nexium 40 Mg Cpdr (Esomeprazole magnesium) .... One daily 5)  Flonase 50 Mcg/act Susp (Fluticasone propionate) .... Tow squirts in each nostril daily 6)  Peg 3350 Powd (Polyethylene glycol 3350) .Marland KitchenMarland Kitchen. 17 gm in 4 oz  water two times a day for 3 days then daily, qs for one month 7)  Celexa 40 Mg Tabs (Citalopram hydrobromide) .... One daily 8)  Trazodone Hcl 100 Mg Tabs (Trazodone hcl) .... One qhs 9)  Hydrocortisone 2.5 % Crea (Hydrocortisone) .... Apply to affected area daily, 30 gm tube 10)  Mobic 7.5 Mg Tabs (Meloxicam) .... One tab by mouth 1-2 times a day for pain. 11)  Lamisil 250 Mg Tabs (Terbinafine hcl) .Marland Kitchen.. 1 tab by mouth daily x 12 weeks 12)  Keflex 500 Mg Caps (Cephalexin) .... One by mouth two times a day x 3d  Patient Instructions: 1)  Great to see you, 2)  I think your [facility] joint is injured rather than your rotator cuff. 3)  Wear a sling for 2 weeks. 4)  Mobic for pain. 5)  Take Keflex two times a day for 3d. 6)  Come back to see me as needed. 7)  -Dr. Karie Schwalbe. Prescriptions: MOBIC 7.5 MG TABS (MELOXICAM) One tab by mouth 1-2 times a day for pain.  #30  x 0   Entered and Authorized by:   Rodney Langton MD   Signed by:   Rodney Langton MD on 03/09/2010   Method used:   Electronically to        [facility] [phone]* (retail)       [address]       [place], [place]  [address]       Ph: [phone]       Fax: [phone]   RxID:   [id number] KEFLEX 500 MG CAPS (CEPHALEXIN) One by mouth two times a day x 3d  #6 x 0   Entered and Authorized by:   Rodney Langton MD   Signed by:   Rodney Langton MD on 03/09/2010   Method used:   Electronically to        [facility] [phone]* (retail)       [address]       [place], [place]  [address]       Ph: [phone]       Fax: [phone]   RxID:   [id number] MOBIC 7.5 MG TABS (MELOXICAM) One tab by mouth 1-2 times a day for pain.  #30 x 0   Entered and Authorized by:   Rodney Langton MD   Signed by:   Rodney Langton MD on 03/09/2010   Method used:   Print then Give to Patient   RxID:   [id number] KEFLEX 500 MG CAPS (CEPHALEXIN) One by mouth two times a day x 3d  #6 x 0   Entered and  Authorized by:   Rodney Langton MD   Signed by:   Rodney Langton MD on 03/09/2010   Method used:   Print then Give to Patient   RxID:   [id number]    Orders Added: 1)  [facility]- Est  Level 4 [15176] 2)  [place] LIMITED [16073] 3)  [place] NDL PLMT IMG S&I [71062] 4)  Injection, intermediate joint - [facility] [20605] 5)  Arm Sling Universal [A4565]

## 2010-05-21 NOTE — Progress Notes (Signed)
Summary: approval  Phone Note Call from Patient Call back at Home Phone [phone]   Caller: Patient Summary of Call: pt states that Chauncey Reading will approve her going to [facility] hosp for bone density test Initial call taken by: De Nurse,  June 10, 2009 8:50 AM  Follow-up for Phone Call        message left for patient to return call. appointment for dexa scan 06/18/2009 at 1:00 PM, arrive 12:45 PM. she will have mammogram that same day. will advise patient no calcium day before and day of appointment. Follow-up by: Theresia Lo RN,  June 10, 2009 11:55 AM  Additional Follow-up for Phone Call Additional follow up Details #1::        patient notified . Additional Follow-up by: Theresia Lo RN,  June 10, 2009 3:24 PM

## 2010-05-21 NOTE — Assessment & Plan Note (Signed)
Summary: flu shot.df  Nurse Visit   Vital Signs:  Patient profile:   50 year old female Temp:     98.3 degrees F  Vitals Entered By: Theresia Lo RN (January 19, 2010 3:31 PM)  Allergies: 1)  ! Prednisone 2)  * Nortryptyline  Immunizations Administered:  Influenza Vaccine # 1:    Vaccine Type: Fluvax 3+    Site: left deltoid    Mfr: GlaxoSmithKline    Dose: 0.5 ml    Route: IM    Given by: Theresia Lo RN    Exp. Date: 06/272012    Lot #: [id number]    VIS given: 11/11/09 version given January 19, 2010.  Flu Vaccine Consent Questions:    Do you have a history of severe allergic reactions to this vaccine? no    Any prior history of allergic reactions to egg and/or gelatin? no    Do you have a sensitivity to the preservative Thimersol? no    Do you have a past history of Guillan-Barre Syndrome? no    Do you currently have an acute febrile illness? no    Have you ever had a severe reaction to latex? no    Vaccine information given and explained to patient? yes    Are you currently pregnant? no  Orders Added: 1)  Flu Vaccine 35yrs + [90658] 2)  Admin 1st Vaccine [96295]

## 2010-05-21 NOTE — Miscellaneous (Signed)
  Clinical Lists Changes  Medications: Rx of TRAMADOL-ACETAMINOPHEN 37.5-325 MG TABS (TRAMADOL-ACETAMINOPHEN) 2 tabs three times a day as needed pain;  #240 x 1;  Signed;  Entered by: Luretha Murphy NP;  Authorized by: Luretha Murphy NP;  Method used: Electronically to [facility] [phone]*, [address], [place], [place]  [address], Ph: [phone], Fax: [phone]    Prescriptions: TRAMADOL-ACETAMINOPHEN 37.5-325 MG TABS (TRAMADOL-ACETAMINOPHEN) 2 tabs three times a day as needed pain  #240 x 1   Entered and Authorized by:   Luretha Murphy NP   Signed by:   Luretha Murphy NP on 03/18/2010   Method used:   Electronically to        [facility] [phone]* (retail)       [address]       [place], [place]  [address]       Ph: [phone]       Fax: [phone]   RxID:   [id number]

## 2010-05-21 NOTE — Op Note (Signed)
Summary: consent   consent   Imported By: Marily Memos 03/19/2010 15:05:05  _____________________________________________________________________  External Attachment:    Type:   Image     Comment:   External Document

## 2010-05-21 NOTE — Progress Notes (Signed)
Summary: re: US/gyn referral/TS  Phone Note Call from Patient Call back at Home Phone [phone]   Caller: Patient Summary of Call: pt thinks that someone called her - pls call w/ results Initial call taken by: De Nurse,  October 24, 2009 1:43 PM  Follow-up for Phone Call        called pt and informed of [place] results. will set her up with gyn appt at [facility] per s.saxon. pt agreed. we will call pt with the appt. Follow-up by: Arlyss Repress CMA,,  October 24, 2009 4:25 PM

## 2010-05-21 NOTE — Progress Notes (Signed)
Summary: Rx Req  Phone Note Refill Request Call back at Home Phone [phone] Message from:  Patient  Refills Requested: Medication #1:  HYDROCODONE-ACETAMINOPHEN 7.5-750 MG TABS one three times a day as needed pain [BMN] PT USES [facility].  PT RESCHEDULED APPT FOR 06/09/09.  Initial call taken by: Clydell Hakim,  June 03, 2009 1:47 PM    New/Updated Medications: HYDROCODONE-ACETAMINOPHEN 7.5-750 MG TABS (HYDROCODONE-ACETAMINOPHEN) one three times a day as needed pain [BMN] Prescriptions: HYDROCODONE-ACETAMINOPHEN 7.5-750 MG TABS (HYDROCODONE-ACETAMINOPHEN) one three times a day as needed pain Brand medically necessary #90 x 0   Entered and Authorized by:   Luretha Murphy NP   Signed by:   Luretha Murphy NP on 06/03/2009   Method used:   Printed then faxed to ...       [facility] (retail)       [address] [place], [place]  [address]       Ph: [phone]       Fax: [phone]   RxID:   [id number]

## 2010-05-21 NOTE — Progress Notes (Signed)
Summary: refill  Phone Note Refill Request Call back at Home Phone [phone] Message from:  Patient  Refills Requested: Medication #1:  HYDROCODONE-ACETAMINOPHEN 7.5-750 MG TABS one three times a day as needed pain (may fill on 08/21/09) [BMN] pls let her know when it is called in  Initial call taken by: De Nurse,  Sep 16, 2009 2:20 PM    New/Updated Medications: HYDROCODONE-ACETAMINOPHEN 7.5-750 MG TABS (HYDROCODONE-ACETAMINOPHEN) one three times a day as needed pain (may fill on 08/21/09) Prescriptions: HYDROCODONE-ACETAMINOPHEN 7.5-750 MG TABS (HYDROCODONE-ACETAMINOPHEN) one three times a day as needed pain (may fill on 08/21/09)  #90 x 0   Entered and Authorized by:   Luretha Murphy NP   Signed by:   Luretha Murphy NP on 09/16/2009   Method used:   Print then Give to Patient   RxID:   [id number]

## 2010-05-21 NOTE — Miscellaneous (Signed)
Summary: Leg cramps  Patient is having severe leg cramps still.  She is not sure what to do about it.   Norma [name]  March 18, 2010 11:31 AM  Patient will need to come in for this visit Norma Murphy NP  March 18, 2010 1:36 PM

## 2010-05-21 NOTE — Miscellaneous (Signed)
Summary: Response to request for early refill  Clinical Lists Changes      Phone Note Outgoing Call   Call placed by: Zachery Dauer MD,  October 15, 2009 3:26 PM Summary of Call: I called to discuss her lab results and in response to a fax from the HD pharmacy to refill her Vicodin early. Her transaminases are mildly elevated and I would be concerned about her taking more than 3000 mg of Acetaminophen daily, ie more than 4 of her Vicodins. This message was also written to fax back to the pharmacy and that she should return to three times a day asap  Initial call taken by: machine

## 2010-05-21 NOTE — Progress Notes (Signed)
Summary: refill  Phone Note Refill Request Call back at Home Phone [phone] Message from:  Patient  Refills Requested: Medication #1:  HYDROCODONE-ACETAMINOPHEN 7.5-750 MG TABS one three times a day as needed pain (may fill on 08/21/09) [facility]  Initial call taken by: De Nurse,  October 10, 2009 2:39 PM    New/Updated Medications: HYDROCODONE-ACETAMINOPHEN 7.5-750 MG TABS (HYDROCODONE-ACETAMINOPHEN) one three times a day as needed pain Prescriptions: HYDROCODONE-ACETAMINOPHEN 7.5-750 MG TABS (HYDROCODONE-ACETAMINOPHEN) one three times a day as needed pain  #90 x 0   Entered and Authorized by:   Luretha Murphy NP   Signed by:   Luretha Murphy NP on 10/10/2009   Method used:   Printed then faxed to ...       [facility] (retail)       [address] [place], [place]  [address]       Ph: [phone]       Fax: [phone]   RxID:   [id number]

## 2010-05-21 NOTE — Letter (Signed)
Summary: Handout Printed  Printed Handout:  - Shoulder Separation (Acromio Clavicular Separation)

## 2010-05-21 NOTE — Assessment & Plan Note (Signed)
Summary: generalized pain/[place]/Jaley Yan   Vital Signs:  Patient profile:   50 year old female Height:      68 inches Weight:      261.3 pounds BMI:     39.87 Temp:     97.9 degrees F oral Pulse rate:   74 / minute BP sitting:   122 / 84  (left arm)  Vitals Entered By: Renato Battles slade,cma CC: abdominal pain Is Patient Diabetic? No Pain Assessment Patient in pain? yes     Location: abdomen Intensity: 8 Onset of pain  Chronic   Primary Care Provider:  Luretha Murphy NP  CC:  abdominal pain.  History of Present Illness: Everything hurts, her shoulders, her breasts, her arms, her back.    She started vaginal bleeding again (completed provera) and cramping, GYN apt is 11/26/09.  She was very upset at [facility] because they asked personal questions, she was there to get her narcotics since the Health Dept is no longer distrbuting them.    Habits & Providers  Alcohol-Tobacco-Diet     Tobacco Status: never  Current Medications (verified): 1)  Hydrochlorothiazide 25 Mg  Tabs (Hydrochlorothiazide) .Marland Kitchen.. 1 Tab By Mouth Daily 2)  Diltiazem Hcl Coated Beads 180 Mg Xr24h-Cap (Diltiazem Hcl Coated Beads) .... One Daily 3)  Hydrocodone-Acetaminophen 7.5-750 Mg Tabs (Hydrocodone-Acetaminophen) .... One Three Times A Day As Needed Pain 4)  Nexium 40 Mg Cpdr (Esomeprazole Magnesium) .... One Daily 5)  Flonase 50 Mcg/act Susp (Fluticasone Propionate) .... Tow Squirts in Each Nostril Daily 6)  Peg 3350  Powd (Polyethylene Glycol 3350) .Marland KitchenMarland KitchenMarland Kitchen 17 Gm in 4 Oz Water Two Times A Day For 3 Days Then Daily, Qs For One Month 7)  Celexa 40 Mg Tabs (Citalopram Hydrobromide) .... One Daily 8)  Trazodone Hcl 100 Mg Tabs (Trazodone Hcl) .... One Qhs 9)  Hydrocortisone 2.5 % Crea (Hydrocortisone) .... Apply To Affected Area Daily, 30 Gm Tube 10)  Ibuprofen 800 Mg Tabs (Ibuprofen) .... One Three Times A Day As Needed Pain 11)  Provera 5 Mg Tabs (Medroxyprogesterone Acetate) .... One Daily (Until  Seen By Gyn)  Allergies (verified): 1)  ! Prednisone 2)  * Nortryptyline  Review of Systems      See HPI  The patient denies fever.    Physical Exam  General:  Looked well and upset. Lungs:  normal respiratory effort and normal breath sounds.   Heart:  normal rate and regular rhythm.     Impression & Recommendations:  Problem # 1:  FIBROIDS, UTERUS (ICD-218.9) will continue provera 5 mg until evaluated by GYN.   Orders: [facility] (29528) [facility]- Est  Level 4 (41324)  Problem # 2:  CHRONIC PAIN SYNDROME (ICD-338.4)  Discussed realtiy of such and how it is connnected to her anger, depression and life's frustrations.  I highly recommend counseling.  I thought that was to happen as she has seen the psychiatrist with [facility], she will visit them personally to make an apt.  Orders: [facility]- Est  Level 4 (40102)  Problem # 3:  DEPRESSION, MAJOR, RECURRENT (ICD-296.30)  Deep rooted major complicated condition; I have tried to encourage self referral.  She did so such but has not follow up with therapy.  She is now on citalopram and trazodone.  Worry about too much seratonin on board feeding into her manic type behavior.  Son has been referred to a good counseling group that will include her in the process as well.  Orders: [facility]- Est  Level  4 (04540)  Complete Medication List: 1)  Hydrochlorothiazide 25 Mg Tabs (Hydrochlorothiazide) .Marland Kitchen.. 1 tab by mouth daily 2)  Diltiazem Hcl Coated Beads 180 Mg Xr24h-cap (Diltiazem hcl coated beads) .... One daily 3)  Hydrocodone-acetaminophen 7.5-750 Mg Tabs (Hydrocodone-acetaminophen) .... One three times a day as needed pain 4)  Nexium 40 Mg Cpdr (Esomeprazole magnesium) .... One daily 5)  Flonase 50 Mcg/act Susp (Fluticasone propionate) .... Tow squirts in each nostril daily 6)  Peg 3350 Powd (Polyethylene glycol 3350) .Marland KitchenMarland Kitchen. 17 gm in 4 oz water two times a day for 3 days then daily, qs for one month 7)  Celexa 40 Mg Tabs (Citalopram  hydrobromide) .... One daily 8)  Trazodone Hcl 100 Mg Tabs (Trazodone hcl) .... One qhs 9)  Hydrocortisone 2.5 % Crea (Hydrocortisone) .... Apply to affected area daily, 30 gm tube 10)  Ibuprofen 800 Mg Tabs (Ibuprofen) .... One three times a day as needed pain 11)  Provera 5 Mg Tabs (Medroxyprogesterone acetate) .... One daily (until seen by gyn)  Patient Instructions: 1)  Take provera until you are seen by GYN 2)  Ibuprofen will help with cramping 3)  Think positive 4)  make apt for counseling 5)  Switch meds to [facility] except narcotics from [facility] and nasal spray and Nexium form [facility] program 6)  Please schedule a follow-up appointment in 1 month.  Prescriptions: PROVERA 5 MG TABS (MEDROXYPROGESTERONE ACETATE) one daily (until seen by GYN)  #30 x 1   Entered and Authorized by:   Luretha Murphy NP   Signed by:   Luretha Murphy NP on 11/17/2009   Method used:   Electronically to        [facility] [phone]* (retail)       [address]       [place], [place]  [address]       Ph: [phone]       Fax: [phone]   RxID:   [id number] FLONASE 50 MCG/ACT SUSP (FLUTICASONE PROPIONATE) tow squirts in each nostril daily  #3 x 3   Entered and Authorized by:   Luretha Murphy NP   Signed by:   Luretha Murphy NP on 11/17/2009   Method used:   Print then Give to Patient   RxID:   [id number] IBUPROFEN 800 MG TABS (IBUPROFEN) one three times a day as needed pain  #30 x 6   Entered and Authorized by:   Luretha Murphy NP   Signed by:   Luretha Murphy NP on 11/17/2009   Method used:   Electronically to        [facility] [phone]* (retail)       [address]       [place], [place]  [address]       Ph: [phone]       Fax: [phone]   RxID:   [id number] TRAZODONE HCL 100 MG TABS (TRAZODONE HCL) one qhs  #30 x 6   Entered and Authorized by:   Luretha Murphy NP   Signed by:   Luretha Murphy NP on 11/17/2009   Method used:   Electronically to        [facility] [phone]*  (retail)       [address]       [place], [place]  [address]       Ph: [phone]       Fax: [phone]   RxID:   [id number] CELEXA 40 MG TABS (CITALOPRAM HYDROBROMIDE) one daily  #  30 x 6   Entered and Authorized by:   Luretha Murphy NP   Signed by:   Luretha Murphy NP on 11/17/2009   Method used:   Electronically to        [facility] [phone]* (retail)       [address]       [place], [place]  [address]       Ph: [phone]       Fax: [phone]   RxID:   [id number] DILTIAZEM HCL COATED BEADS 180 MG XR24H-CAP (DILTIAZEM HCL COATED BEADS) one daily  #30 x 6   Entered and Authorized by:   Luretha Murphy NP   Signed by:   Luretha Murphy NP on 11/17/2009   Method used:   Electronically to        [facility] [phone]* (retail)       [address]       [place], [place]  [address]       Ph: [phone]       Fax: [phone]   RxID:   [id number]

## 2010-05-21 NOTE — Miscellaneous (Signed)
Summary: RX  Patient is still in alot of pain and wants to know if you can call her in a different pain medication.  She uses [facility] [address]. [name]  March 18, 2010 11:30 AM   Discussed with patient, she is having increased pain at the [facility] joint that was injected, recommended Dr. Karie Schwalbe for follow up.   Not any available meds for cramping, stay hydrated, stretch. Add Tramadol/Tylenol combo three times a day as needed Norma Murphy NP  March 18, 2010 2:04 PM

## 2010-05-21 NOTE — Assessment & Plan Note (Signed)
Summary: legs giving out,tcb   Vital Signs:  Patient profile:   50 year old female Weight:      258.3 pounds Pulse rate:   73 / minute BP sitting:   116 / 86  (left arm) Cuff size:   large  Vitals Entered By: Norma [name] slade,cma CC: pt states 'legs giving out, when getting up' x 1 1/2 mos Is Patient Diabetic? No Pain Assessment Patient in pain? no        Primary Care Provider:  Luretha Murphy NP  CC:  pt states 'legs giving out and when getting up' x 1 1/2 mos.  History of Present Illness: Norma [name] describes her right leg giving out and causing her to fall.  This generally occurs when she jumps up quickly and places all of her weight on that side.  Her knees hurt, both do, R>L.  When she walks her knees make a sound.  She has known spondloysis of L5-S1 per MRI and xray.  She has chronic pain and depression and is on a pain contract using hydrocodone 7.5/750 mg three times a day.  When asked if the falls seem to be related to sedation she felt they were not associated.  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  When asked about weight loss activities, she stated when I go on a diet it makes me nervous.  She has been eating a lot of pasta with a processed frozen mix.  Habits & Providers  Alcohol-Tobacco-Diet     Tobacco Status: never  Current Medications (verified): 1)  Hydrochlorothiazide 25 Mg  Tabs (Hydrochlorothiazide) .Marland Kitchen.. 1 Tab By Mouth Daily 2)  Diltiazem Hcl Coated Beads 180 Mg Xr24h-Cap (Diltiazem Hcl Coated Beads) .... One Daily 3)  Hydrocodone-Acetaminophen 7.5-750 Mg Tabs (Hydrocodone-Acetaminophen) .... One Three Times A Day As Needed Pain 4)  Nexium 40 Mg Cpdr (Esomeprazole Magnesium) .... One Daily 5)  Flonase 50 Mcg/act Susp (Fluticasone Propionate) .... Tow Squirts in Each Nostril Daily 6)  Peg 3350  Powd (Polyethylene Glycol 3350) .Marland KitchenMarland KitchenMarland Kitchen 17 Gm  in 4 Oz Water Two Times A Day For 3 Days Then Daily, Qs For One Month 7)  Celexa 40 Mg Tabs (Citalopram Hydrobromide) .... One Daily 8)  Trazodone Hcl 100 Mg Tabs (Trazodone Hcl) .... One Qhs 9)  Hydrocortisone 2.5 % Crea (Hydrocortisone) .... Apply To Affected Area Daily, 30 Gm Tube 10)  Ibuprofen 800 Mg Tabs (Ibuprofen) .... One Three Times A Day As Needed Pain 11)  Lamisil 250 Mg Tabs (Terbinafine Hcl) .Marland Kitchen.. 1 Tab By Mouth Daily X 12 Weeks  Allergies (verified): 1)  ! Prednisone 2)  * Nortryptyline  Past History:  Past Medical History: Last updated: 06/09/2009 HepC dx`d 5/02 Positive Rheumatoid Factor (2/02) PSVT-followed by [facility] 06/2002 PTSD rectal fissure via anoscopy 08/25/05 Hbg 10.3, stool studies neg 08/25/05/ Hemeoccult neg 09/03/05   MRI spine--L4-L5 disk bulge,L5-S1 disk hern. - 6/7/2002T-spine/L5,S1 - 05/24/2000 X-ray R thumb - fracture of volar plate (avulsion) - Xray - Spine(2/01) anterior osteophytes  Treadmill test 02/0 Depression GERD Hypertension  Past Surgical History: Last updated: 12/15/2007 ORIF R ankle bimalleolar fx 04/2005 - 06/07/2005, Treadmill test 02/01   Review of Systems      See HPI  Physical Exam  General:  Alert, obese, sound of crepitus in knees when weight bearing.   Msk:  Knee exam:  no  effusions, full extension no crepitus heard when non-weight bearing, but palpated; no joint line tenderness.  Right knee with some give with valgus force at knee,  Negative straight leg raises.  Difficulty walking on toes and heels on the right,  symmetrical depressed DTRs in LE.   Impression & Recommendations:  Problem # 1:  KNEE PAIN, BILATERAL (ICD-719.46) Xray, suspect DJD secondary to obesity.  Do not beleive this is why right knee is giving out, suspect coming from lower back.  Referred to physical therapy and to [facility] for a second opinion.  Cried when I discussed her weight. Her updated medication list for this problem includes:     Hydrocodone-acetaminophen 7.5-750 Mg Tabs (Hydrocodone-acetaminophen) ..... One three times a day as needed pain    Ibuprofen 800 Mg Tabs (Ibuprofen) ..... One three times a day as needed pain  Orders: Radiology other (Radiology Other) [facility]- Est Level  3 ([address])  Problem # 2:  OSTEOARTHRITIS OF SPINE, NOS (ICD-721.90) Suspect knee giving out more related to chronic back problems, last MRI was 4 years ago.  Discussed that stregthening of her muscles is the only way to improve condition.  She was not very receptive but agreed to a course of PT. Orders: [facility]- Est Level  3 ([address]41)  Problem # 3:  CHRONIC PAIN SYNDROME (ICD-338.4) Likely multifactorial and related to chronic depressive disorder as well.  Difficult to treat, crying when discussion regarding fittness and weight loss was initiated.  Problem # 4:  OBESITY, NOS (ICD-278.00) Has been unable to lose weight dispite counseling with nutrition and ongoing discussions regarding food choices and exercise.  Complete Medication List: 1)  Hydrochlorothiazide 25 Mg Tabs (Hydrochlorothiazide) .Marland Kitchen.. 1 tab by mouth daily 2)  Diltiazem Hcl Coated Beads 180 Mg Xr24h-cap (Diltiazem hcl coated beads) .... One daily 3)  Hydrocodone-acetaminophen 7.5-750 Mg Tabs (Hydrocodone-acetaminophen) .... One three times a day as needed pain 4)  Nexium 40 Mg Cpdr (Esomeprazole magnesium) .... One daily 5)  Flonase 50 Mcg/act Susp (Fluticasone propionate) .... Tow squirts in each nostril daily 6)  Peg 3350 Powd (Polyethylene glycol 3350) .Marland KitchenMarland Kitchen. 17 gm in 4 oz water two times a day for 3 days then daily, qs for one month 7)  Celexa 40 Mg Tabs (Citalopram hydrobromide) .... One daily 8)  Trazodone Hcl 100 Mg Tabs (Trazodone hcl) .... One qhs 9)  Hydrocortisone 2.5 % Crea (Hydrocortisone) .... Apply to affected area daily, 30 gm tube 10)  Ibuprofen 800 Mg Tabs (Ibuprofen) .... One three times a day as needed pain 11)  Lamisil 250 Mg Tabs (Terbinafine hcl) .Marland Kitchen.. 1 tab by  mouth daily x 12 weeks  Other Orders: Hemoccult Cards (Take Home) (Hemoccult Cards)  Patient Instructions: 1)  Sports med for second opinion on why right leg is giving out Prescriptions: HYDROCHLOROTHIAZIDE 25 MG  TABS (HYDROCHLOROTHIAZIDE) 1 tab by mouth daily  #90 x 3   Entered and Authorized by:   Norma Murphy NP   Signed by:   Norma Murphy NP on 02/03/2010   Method used:   Electronically to        [facility] (retail)       [address]       [place], [place]  [address]       Ph: [phone]       Fax: [phone]   RxID:   [id number]    Orders Added: 1)  Radiology other [Radiology Other] 2)  Hemoccult Cards (Take Home) [Hemoccult Cards] 3)  [facility]- Est Level  3 [[address]]     Prevention & Chronic Care Immunizations   Influenza vaccine: Fluvax 3+  (01/19/2010)   Influenza vaccine due: 01/18/2009    Tetanus booster: 01/17/2002: Done.   Tetanus booster due: 01/18/2012    Pneumococcal vaccine: Not documented  Colorectal Screening   Hemoccult: Not documented   Hemoccult action/deferral: Ordered  (02/03/2010)    Colonoscopy: Not documented   Colonoscopy action/deferral: Deferred  (02/03/2010)  Other Screening   Pap smear: NEGATIVE FOR INTRAEPITHELIAL LESIONS OR MALIGNANCY.  (06/09/2009)   Pap smear due: 01/04/2009    Mammogram: ASSESSMENT: Negative - BI-RADS 1^MM DIGITAL SCREENING  (06/18/2009)   Mammogram due: 05/23/2009   Smoking status: never  (02/03/2010)  Lipids   Total Cholesterol: 205  (05/31/2008)   LDL: 107  (05/31/2008)   LDL Direct: 94  (06/09/2009)   HDL: 67  (05/31/2008)   Triglycerides: 154  (05/31/2008)  Hypertension   Last Blood Pressure: 116 / 86  (02/03/2010)   Serum creatinine: 1.04  (01/20/2010)   Serum potassium 3.6  (01/20/2010)  Self-Management Support :    Hypertension self-management support: Not documented   Nursing Instructions: Provide Hemoccult cards with instructions (see order)

## 2010-05-21 NOTE — Progress Notes (Signed)
Summary: triage  Phone Note Call from Patient Call back at Home Phone [phone]   Caller: Patient Summary of Call: Pt having a lot of odd pain all over. Initial call taken by: Clydell Hakim,  November 14, 2009 4:12 PM  Follow-up for Phone Call        lm Follow-up by: Golden Circle RN,  November 14, 2009 4:21 PM  Additional Follow-up for Phone Call Additional follow up Details #1::        LM Additional Follow-up by: Golden Circle RN,  November 17, 2009 8:46 AM    Additional Follow-up for Phone Call Additional follow up Details #2::    still having odd pain. [facility]. she will come at 11am today to be seen for her pain Follow-up by: Golden Circle RN,  November 17, 2009 10:04 AM  Additional Follow-up for Phone Call Additional follow up Details #3:: Details for Additional Follow-up Action Taken: pt seen Additional Follow-up by: Luretha Murphy NP,  November 17, 2009 12:07 PM
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
## 2010-05-21 NOTE — Progress Notes (Signed)
Summary: triage  Phone Note Call from Patient Call back at Home Phone [phone]   Summary of Call: Having pain on right side and feels like she needs to be seen before weekend. Initial call taken by: Clydell Hakim,  October 10, 2009 9:45 AM  Follow-up for Phone Call        pain in RUQ. started 2-3 days ago. pain comes & goes. it is sharp. still has gallbladder. better with laying down but  poor sleep last night. last bm yesterday. last period last week will see pcp at 1:30. asked her to be on time as we are doublebooking pcp. states otcs not used as she has rx pain meds Follow-up by: Golden Circle RN,  October 10, 2009 9:57 AM

## 2010-05-21 NOTE — Miscellaneous (Signed)
Summary: RE: PT/TS  Clinical Lists Changes FAXED PT REFERRAL.Marland KitchenArlyss Repress CMA,  November 28, 2009 12:14 PM

## 2010-05-21 NOTE — Progress Notes (Signed)
  Phone Note Outgoing Call   Call placed by: Demetria Pore MD,  February 26, 2010 4:38 PM Details for Reason: Pt needs the order for her shoulder xray before she can get it done Summary of Call: Tried to call pt. Home phone # is disconnected/ changed. No answer at "work" number. Pt needs to pick up or have order for xray mailed to her before she can get the imaging done. Initial call taken by: Demetria Pore MD,  February 26, 2010 4:39 PM  Follow-up for Phone Call        left VM for pt to call back Follow-up by: De Nurse,  March 02, 2010 4:05 PM  Additional Follow-up for Phone Call Additional follow up Details #1::        pt called back and will come by to pick up order Additional Follow-up by: De Nurse,  March 03, 2010 4:01 PM    Additional Follow-up for Phone Call Additional follow up Details #2::    spoke with patient and informed her that order is up front and ready for pickup Follow-up by: Jimmy Footman, CMA,  March 04, 2010 9:42 AM

## 2010-05-21 NOTE — Progress Notes (Signed)
Summary: rx prob  Phone Note Call from Patient Call back at Home Phone [phone]   Caller: Patient Summary of Call: Pt says that pharmacy states that her pain meds have to be written and can not be faxed. Initial call taken by: Clydell Hakim,  June 25, 2009 3:50 PM  Follow-up for Phone Call        states she is at [facility]. they did get the fax for pain meds but it does not say she can get them filled early. pcp did write a note 3/3 stating could go to QID dosing, but rx written was for three times a day dosing. pt is upset. told her I will send a message to pcp asking for clarification & if she can get the meds early. will call her with response. Follow-up by: Golden Circle RN,  June 25, 2009 4:06 PM  Additional Follow-up for Phone Call Additional follow up Details #1::        new script written for #100, early refill.  We have never had this problem with faxing in the past as it is a CII, here for her to pick up. Additional Follow-up by: Luretha Murphy NP,  June 26, 2009 10:06 AM    Additional Follow-up for Phone Call Additional follow up Details #2::    pt notified that rx is at front ready for p/u Follow-up by: Golden Circle RN,  June 26, 2009 10:31 AM  New/Updated Medications: HYDROCODONE-ACETAMINOPHEN 7.5-750 MG TABS (HYDROCODONE-ACETAMINOPHEN) one three times a day as needed pain (may fill early) [BMN] Prescriptions: HYDROCODONE-ACETAMINOPHEN 7.5-750 MG TABS (HYDROCODONE-ACETAMINOPHEN) one three times a day as needed pain (may fill early) Brand medically necessary #100 x 0   Entered and Authorized by:   Luretha Murphy NP   Signed by:   Luretha Murphy NP on 06/26/2009   Method used:   Print then Give to Patient   RxID:   [id number]

## 2010-05-21 NOTE — Progress Notes (Signed)
  Phone Note Call from Patient   Caller: Patient Call For: [phone] Summary of Call: Pt calling for results of xrays to ankle.  Please call asap Initial call taken by: Abundio Miu,  May 13, 2010 12:29 PM  Follow-up for Phone Call        Called and discussed x-ray and lab results.  Pt had no questions. Follow-up by: Angelena Sole MD,  May 13, 2010 2:07 PM

## 2010-05-21 NOTE — Miscellaneous (Signed)
  Clinical Lists Changes  Medications: Rx of HYDROCODONE-ACETAMINOPHEN 7.5-750 MG TABS (HYDROCODONE-ACETAMINOPHEN) one three times a day as needed pain;  #90 x 0 Brand medically necessary;  Signed;  Entered by: Luretha Murphy NP;  Authorized by: Luretha Murphy NP;  Method used: Print then Give to Patient    Prescriptions: HYDROCODONE-ACETAMINOPHEN 7.5-750 MG TABS (HYDROCODONE-ACETAMINOPHEN) one three times a day as needed pain Brand medically necessary #90 x 0   Entered and Authorized by:   Luretha Murphy NP   Signed by:   Luretha Murphy NP on 11/07/2009   Method used:   Print then Give to Patient   RxID:   [id number]

## 2010-05-21 NOTE — Assessment & Plan Note (Signed)
Summary: leg cramps,df   Vital Signs:  Patient profile:   50 year old female Height:      68 inches Weight:      260.6 pounds BMI:     39.77 Temp:     98.0 degrees F oral Pulse rate:   97 / minute BP sitting:   126 / 84  (left arm) Cuff size:   large  Vitals Entered By: Gladstone Pih (September 29, 2009 3:52 PM) CC: C/O leg cramps bilat X 3-4 days Is Patient Diabetic? No Pain Assessment Patient in pain? no        Primary Care Provider:  Luretha Murphy NP  CC:  C/O leg cramps bilat X 3-4 days.  History of Present Illness: Having legs cramps at night.  Jumps out of bed, and when she does her left knee gives out.  Working hours have been decreased, she is only working 13 hours a day and cannot live on that money.Feels like she does not know what to do.  Still very unhappy, in the process of being evaluated by mental health.  Has not yet seen the psychiatrist but the mid level changed meds to citalopram and trazodone for sleep.  Habits & Providers  Alcohol-Tobacco-Diet     Tobacco Status: never  Current Medications (verified): 1)  Hydrochlorothiazide 25 Mg  Tabs (Hydrochlorothiazide) .Marland Kitchen.. 1 Tab By Mouth Daily 2)  Diltiazem Hcl Coated Beads 180 Mg Xr24h-Cap (Diltiazem Hcl Coated Beads) .... One Daily 3)  Triamcinolone Acetonide 0.5 % Crea (Triamcinolone Acetonide) .... Apply To Aa Two Times A Day X 2 Wks.  Large Op 4)  Hydrocodone-Acetaminophen 7.5-750 Mg Tabs (Hydrocodone-Acetaminophen) .... One Three Times A Day As Needed Pain (May Fill On 08/21/09) 5)  Nexium 40 Mg Cpdr (Esomeprazole Magnesium) .... One Daily 6)  Flonase 50 Mcg/act Susp (Fluticasone Propionate) .... Tow Squirts in Each Nostril Daily 7)  Peg 3350  Powd (Polyethylene Glycol 3350) .Marland KitchenMarland KitchenMarland Kitchen 17 Gm in 4 Oz Water Two Times A Day For 3 Days Then Daily, Qs For One Month 8)  Flexeril 10 Mg Tabs (Cyclobenzaprine Hcl) .... One Three Times A Day As Needed Muscle Pain and Spasms 9)  Celexa 40 Mg Tabs (Citalopram Hydrobromide) ....  One Daily 10)  Trazodone Hcl 100 Mg Tabs (Trazodone Hcl) .... One Qhs  Allergies: 1)  ! Prednisone 2)  * Nortryptyline  Physical Exam  General:  Truncal obesity wtih very thin legs. Msk:  good muslce tone, full ROM of joints   Impression & Recommendations:  Problem # 1:  MUSCLE CRAMPS (ICD-729.82) check electrolytes, exercise and hydration recommended Orders: [facility]- Est Level  3 ([address]21)  Problem # 2:  OBESITY, NOS (ICD-278.00) Purchased smoothie shakes to promote weight loss over the radio, having to pay-not for free, only drinking 2 per day and weight not changing, discussed metabolism issues. Orders: [facility]- Est Level  3 (30865)  Problem # 3:  DEPRESSION, MAJOR, RECURRENT (ICD-296.30) In the process of being evaluated at mental health, meds changed and updated Orders: [facility]- Est Level  3 (78469)  Complete Medication List: 1)  Hydrochlorothiazide 25 Mg Tabs (Hydrochlorothiazide) .Marland Kitchen.. 1 tab by mouth daily 2)  Diltiazem Hcl Coated Beads 180 Mg Xr24h-cap (Diltiazem hcl coated beads) .... One daily 3)  Triamcinolone Acetonide 0.5 % Crea (Triamcinolone acetonide) .... Apply to aa two times a day x 2 wks.  large op 4)  Hydrocodone-acetaminophen 7.5-750 Mg Tabs (Hydrocodone-acetaminophen) .... One three times a day as needed pain (may fill on 08/21/09) 5)  Nexium 40 Mg Cpdr (Esomeprazole magnesium) .... One daily 6)  Flonase 50 Mcg/act Susp (Fluticasone propionate) .... Tow squirts in each nostril daily 7)  Peg 3350 Powd (Polyethylene glycol 3350) .Marland KitchenMarland Kitchen. 17 gm in 4 oz water two times a day for 3 days then daily, qs for one month 8)  Flexeril 10 Mg Tabs (Cyclobenzaprine hcl) .... One three times a day as needed muscle pain and spasms 9)  Celexa 40 Mg Tabs (Citalopram hydrobromide) .... One daily 10)  Trazodone Hcl 100 Mg Tabs (Trazodone hcl) .... One qhs  Other Orders: Basic Met-[facility] [phone])  Patient Instructions: 1)  Walk first in the AM -30 minutes 2)  Shakes first in the  morning 3)  Lunch salad with egg, [place] or baked chicken 4)  Orange or apple for a snack 5)  Supper-lots of veges and [place], chicken or fish 6)  Drink a lot of water. Prescriptions: TRAZODONE HCL 100 MG TABS (TRAZODONE HCL) one qhs  #30 x 3   Entered and Authorized by:   Luretha Murphy NP   Signed by:   Luretha Murphy NP on 09/29/2009   Method used:   Electronically to        [facility] [id number]* (retail)       [address]       [place], [place]  [address]       Ph: [phone]       Fax: [phone]   RxID:   [id number]

## 2010-05-21 NOTE — Letter (Signed)
Summary: Generic Letter  [facility]  [address]   [place], [place] [address]   Phone: [phone]  Fax: [phone]    06/12/2009  Norma [name] [address] [place], [place]  [address]  Dear Norma [name],   All tests at the [facility] were within normal limits.        Sincerely,   Luretha Murphy NP  Appended Document: Generic Letter mailed.

## 2010-05-21 NOTE — Miscellaneous (Signed)
Summary: Re ; Meds  Clinical Lists Changes   called patient to inform her of appointment scheduled with the [facility]. she stopped depakote as instructed and is wondering if Luretha Murphy wants her to resume Zoloft until she gets started on other medication. states she will be a mess without anything. will forward message to Darl Pikes. Theresia Lo RN  August 05, 2009 10:14 AM   Resume Zoloft, patient called. Luretha Murphy NP  August 05, 2009 10:30 AM
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
## 2010-05-21 NOTE — Progress Notes (Signed)
Summary: refill/script for refill on 08/21/09 up front in file  Phone Note Refill Request Call back at Home Phone [phone] Message from:  Patient  Refills Requested: Medication #1:  HYDROCODONE-ACETAMINOPHEN 7.5-750 MG TABS one three times a day as needed pain (may fill early) [BMN] Initial call taken by: De Nurse,  Aug 18, 2009 11:57 AM  Follow-up for Phone Call        I will refill on Thursday, she can pick it up then. Follow-up by: Luretha Murphy NP,  Aug 18, 2009 3:36 PM    New/Updated Medications: HYDROCODONE-ACETAMINOPHEN 7.5-750 MG TABS (HYDROCODONE-ACETAMINOPHEN) one three times a day as needed pain (may fill on 08/21/09) [BMN] Prescriptions: HYDROCODONE-ACETAMINOPHEN 7.5-750 MG TABS (HYDROCODONE-ACETAMINOPHEN) one three times a day as needed pain (may fill on 08/21/09) Brand medically necessary #90 x 0   Entered and Authorized by:   Luretha Murphy NP   Signed by:   Luretha Murphy NP on 08/18/2009   Method used:   Print then Give to Patient   RxID:   [id number]

## 2010-05-21 NOTE — Assessment & Plan Note (Signed)
Summary: tb test read,tcb  Nurse Visit   Allergies: 1)  ! Prednisone 2)  * Nortryptyline  PPD Results    Date of reading: 11/06/2009    Results: 0 mm    Interpretation: negative  Orders Added: 1)  No Charge Patient Arrived (NCPA0) [NCPA0]

## 2010-05-21 NOTE — Letter (Signed)
Summary: Generic Letter  [facility]  [address]   [place], [place] [address]   Phone: [phone]  Fax: [phone]    Norma [name] [address] [place], [place]  [address]      To Whom this May Concern,   [name] was treated in our clinic on 08/20/09 for a knee abrasion. Per report she fell outside of her home, on the brick steps which were uneven and loose on 08/19/09. This record is to indicate she was a seen by a physical regarding this injury.  If you have any questions please feel free to call.        Sincerely,   Milinda Antis MD

## 2010-05-21 NOTE — Progress Notes (Signed)
Summary: refill  Phone Note Refill Request Call back at [phone] Message from:  Patient  Refills Requested: Medication #1:  HYDROCODONE-ACETAMINOPHEN 7.5-750 MG TABS one three times a day as needed pain [facility]  Initial call taken by: De Nurse,  February 25, 2010 9:28 AM    New/Updated Medications: HYDROCODONE-ACETAMINOPHEN 7.5-750 MG TABS (HYDROCODONE-ACETAMINOPHEN) one three times a day as needed pain Prescriptions: HYDROCODONE-ACETAMINOPHEN 7.5-750 MG TABS (HYDROCODONE-ACETAMINOPHEN) one three times a day as needed pain  #90 x 0   Entered and Authorized by:   Luretha Murphy NP   Signed by:   Luretha Murphy NP on 02/25/2010   Method used:   Print then Give to Patient   RxID:   [id number]   Appended Document: refill wants to know where the rx is  Appended Document: refill it was faxed to [facility] outpatient pharmacy

## 2010-05-21 NOTE — Progress Notes (Signed)
Summary: Referral Req  Phone Note Call from Patient Call back at [facility] Phone [phone]   Caller: Patient Summary of Call: The only way Mental Health will see her since she has the [place] card is for [place] to refer her.  Luretha Murphy wants her to go to Mental Health. Initial call taken by: Clydell Hakim,  August 04, 2009 1:52 PM  Follow-up for Phone Call        will fwd. to S.Lucilia Yanni for review. Follow-up by: Arlyss Repress CMA,,  August 04, 2009 2:44 PM  Additional Follow-up for Phone Call Additional follow up Details #1::        see formal order for referral, will route to RN doing referrals.  I always thought that patients could refer themselves to the [facility] system.  If she wants to go to a private, that will be more difficult.  I would recommend the [facility] at [phone]. Additional Follow-up by: Luretha Murphy NP,  August 04, 2009 3:10 PM    Additional Follow-up for Phone Call Additional follow up Details #2::    noted and will try to scheduled. Follow-up by: Theresia Lo RN,  August 04, 2009 3:19 PM

## 2010-05-21 NOTE — Letter (Signed)
Summary: Generic Letter  [facility]  [address]   [place], [place] [address]   Phone: [phone]  Fax: [phone]    08/20/2009     Norma [name] [address] [place], [place]  [address]      To Whom this May Concern,   [name] was treated in our clinic on 08/20/09 for a knee abrasion. Per report she fell outside of her home on 08/19/09. This record is to indicate she was a seen by a physical regarding this injury.  If you have any questions please feel free to call.        Sincerely,   Milinda Antis MD  Appended Document: Generic Letter Pt wanted letter to state she fell on the brick outside her home

## 2010-05-21 NOTE — Miscellaneous (Signed)
  Clinical Lists Changes  Problems: Added new problem of FIBROIDS, UTERUS (ICD-218.9) - Signed Orders: Added new Referral order of Gynecologic Referral (Gyn) - Signed

## 2010-05-21 NOTE — Letter (Signed)
Summary: Out of Work  [facility]  [address]   [place], [place] [address]   Phone: [phone]  Fax: [phone]    March 23, 2010   Employee:  LAURENCIA ROMA    To Whom It May Concern:   For Medical reasons, please excuse the above named employee from work for the following dates:  March 23, 2010  If you need additional information, please feel free to contact our office.         Sincerely,    Jimmy Footman, CMA for Rodney Langton, MD

## 2010-05-21 NOTE — Assessment & Plan Note (Signed)
 Summary: itchy rash   Vital Signs:  Patient Profile:   50 Years Old Female Height:     66 inches (167.64 cm) Weight:      258.5 pounds Temp:     98.0 degrees F Pulse rate:   87 / minute BP sitting:   120 / 83  (left arm)  Pt. in pain?   no  Vitals Entered By: GREIG LUNGER RN (May 02, 2008 3:17 PM)                  PCP:  DEVERE HARRIES NP  Chief Complaint:  itchy rash on arms.  History of Present Illness: Pt presents with pruritic lesions on bilateral upper extremeties.  States ricka been there for about 2-3 days, coworker has same symptoms but noone in the home has these symptoms, pruritic, hasnt tried any topical remedies.  Doesn't know if she was bitten by an insects but does have an outside dog that she primarily takes care of.  Only present on arms, not anywhere else.  No other complaints, no systemic complaints, no HA, SOB, CP, N/V/D/C, no joint pains, no other rashes.    Current Allergies: ! PREDNISONE  ! * AMITRYPTILINE  Past Medical History:    Reviewed history from 12/15/2007 and no changes required:       Followed at the pain clinic       HepC dx`d 5/02       Positive Rheumatoid Factor (2/02)       PSVT-followed by Claudene 06/2002       PTSD       rectal fissure via anoscopy 08/25/05       Hbg 10.3, stool studies neg 08/25/05/ Hemeoccult neg 09/03/05         MRI spine--L4-L5 disk bulge,L5-S1 disk hern. - 6/7/2002T-spine/L5,S1 - 05/24/2000       X-ray R thumb - fracture of volar plate (avulsion) -       Xray - Spine(2/01) anterior osteophytes        Treadmill test 02/0  Past Surgical History:    Reviewed history from 12/15/2007 and no changes required:       ORIF R ankle bimalleolar fx 04/2005 - 06/07/2005, Treadmill test 02/01    Family History:    Reviewed history from 02/01/2008 and no changes required:       father-deceased, 30-40, MVA       mother-67 healthy         no family hx of skin malignancy       no family hx of early MI  Social History:  Reviewed history from 09/27/2007 and no changes required:       Son= Jayquan b. 1999  Daughter--Shaquita.; Husband murdered 6/01.  No tob, EtoH, drugs. No IVDA.  No previous blood transfusions.  Has a son who is incarcerated 10/2004.       Review of Systems       See HPI   Physical Exam  General:     Well-developed,well-nourished,in no acute distress; alert,appropriate and cooperative throughout examination Skin:     Several pruritic, erythematous papules on B/L forearms in no particular distribution, approx 3mm across, no LAD.  No central umbilication, not excoriated, no signs secondary infection. One lesion with central clearing, macular, but all others papular.  Back and waistband examined, negative for any lesions.    Impression & Recommendations:  Problem # 1:  INSECT BITE, LOWER ARM (ICD-913.4) Precepted with Dr. Sharlet, this appears to be 2/2 insect bites.  Though one area could qualify as ringworm, we will treat them all with hydrocortisone  cream 2.5% and expect resolution in 2 weeks.  If there is no improvement, will switch to combination antifungal/steroid cream such as Lotrisone or other.  Pt to follow up in 2 weeks.  Orders: [facility]- Est Level  3 ([address]86)   Complete Medication List: 1)  Neurontin  400 Mg Caps (Gabapentin ) .... Take 2 capsule by mouth in the morning and afternoon and at night 2)  Hydrocodone -acetaminophen  10-660 Mg Tabs (Hydrocodone -acetaminophen ) .... One three times a day prn 3)  Zoloft 100 Mg Tabs (Sertraline hcl) .SABRA.. 1 tab by mouth daily. 4)  Hydrochlorothiazide  25 Mg Tabs (Hydrochlorothiazide ) .SABRA.. 1 tab by mouth daily 5)  Flexeril  10 Mg Tabs (Cyclobenzaprine  hcl) .SABRA.. 1 tabletpo at bedtime for muscle relaxer 6)  Cardizem  90 Mg Tabs (Diltiazem  hcl) .... One daily 7)  Budeprion Sr 150 Mg Xr12h-tab (Bupropion  hcl) .... One daily 8)  Sertraline Hcl 100 Mg Tabs (Sertraline hcl) .... One daily 9)  Naproxen  500 Mg Tabs (Naproxen ) .... Take one by mouth two  times a day x 2 wks 10)  Hydrocortisone  2.5 % Oint (Hydrocortisone ) .... Apply to affected areas three times a day.  continue 2 days after all symptoms resolve.   Patient Instructions: 1)  Nice to meet you today, I want you to try hydrocortisone  cream 3x per day to your itchy areas.  Call back in 2 weeks if there is no improvement to make an appointment. 2)  -Dr. ONEIDA.   Prescriptions: HYDROCORTISONE  2.5 % OINT (HYDROCORTISONE ) Apply to affected areas three times a day.  Continue 2 days after all symptoms resolve.  #1 tube x 3   Entered and Authorized by:   DEBBY PETTIES MD   Signed by:   DEBBY PETTIES MD on 05/02/2008   Method used:   Electronically to        [id number] (retail)       [address]       [place], [place]  [address]       Ph: [phone] or [phone]       Fax: [phone]   RxID:   [id number]

## 2010-05-21 NOTE — Progress Notes (Signed)
  Phone Note Call from Patient   Caller: Patient Call For: [phone] Summary of Call: Patient requesting a cough med to be called to pharmacy Initial call taken by: Abundio Miu,  April 03, 2010 2:29 PM  Follow-up for Phone Call        instructed to use the hydrocodone that she has for her back pain that it is a very good cough medicine.  She agreed Follow-up by: Luretha Murphy NP,  April 03, 2010 2:35 PM

## 2010-05-21 NOTE — Progress Notes (Signed)
Summary: X-Ray Requsted  Phone Note Call from Patient Call back at Home Phone [phone]   Caller: Patient Summary of Call: Pt asking to have an x-ray of her pelvis bone she is in extreme pain and cannot sit for anylonger than 5 mins. or stand for long periords. Initial call taken by: Clydell Hakim,  June 18, 2009 2:48 PM  Follow-up for Phone Call        Called patient, may increase VIcodin to qid until over the acute back pain, get back on Topamax, (self discontinued), discussed simple stretching with knee to chest.  Has no form of third party payment to refer for epidural injections.  Has known lumbar spine DJD. Follow-up by: Luretha Murphy NP,  June 19, 2009 9:01 AM

## 2010-05-21 NOTE — Letter (Signed)
Summary: Handout Printed  Printed Handout:  - Acromioclavicular Injuries

## 2010-05-21 NOTE — Progress Notes (Signed)
Summary: refill  Phone Note Refill Request Call back at Home Phone [phone] Message from:  Patient  Refills Requested: Medication #1:  HYDROCODONE-ACETAMINOPHEN 7.5-750 MG TABS one three times a day as needed pain [BMN] Please call when ready  Initial call taken by: De Nurse,  December 29, 2009 12:28 PM    Prescriptions: HYDROCODONE-ACETAMINOPHEN 7.5-750 MG TABS (HYDROCODONE-ACETAMINOPHEN) one three times a day as needed pain Brand medically necessary #90 x 0   Entered and Authorized by:   Luretha Murphy NP   Signed by:   Luretha Murphy NP on 12/29/2009   Method used:   Print then Give to Patient   RxID:   [id number]

## 2010-05-21 NOTE — Progress Notes (Signed)
Summary: refill  Phone Note Refill Request Call back at Home Phone [phone] Message from:  Patient  Refills Requested: Medication #1:  HYDROCHLOROTHIAZIDE 25 MG  TABS 1 tab by mouth daily [facility] [address] wants to make sure that this is on the $4 plan - [facility] is no longer doing this  Initial call taken by: De Nurse,  September 23, 2009 11:37 AM  Follow-up for Phone Call       Follow-up by: Golden Circle RN,  September 23, 2009 12:17 PM    Prescriptions: HYDROCHLOROTHIAZIDE 25 MG  TABS (HYDROCHLOROTHIAZIDE) 1 tab by mouth daily  #90 x 0   Entered by:   Golden Circle RN   Authorized by:   Luretha Murphy NP   Signed by:   Golden Circle RN on 09/23/2009   Method used:   Electronically to        [facility] [phone]* (retail)       [address]       [place], [place]  [address]       Ph: [phone]       Fax: [phone]   RxID:   [id number]

## 2010-05-21 NOTE — Miscellaneous (Signed)
Summary: re: PT referral  Clinical Lists Changes  FAXED PT REFERRAL TO [phone].Tessie Fass CMA  February 03, 2010 11:43 AM

## 2010-05-21 NOTE — Assessment & Plan Note (Signed)
Summary: RUQ pain/[place]   Vital Signs:  Patient profile:   50 year old female Height:      68 inches Weight:      254.5 pounds BMI:     38.84 Temp:     98.7 degrees F oral Pulse rate:   82 / minute BP sitting:   127 / 83  (right arm) Cuff size:   regular  Vitals Entered By: Arlyss Repress CMA, (October 10, 2009 1:33 PM) CC: see triage note. Is Patient Diabetic? No Pain Assessment Patient in pain? no        Primary Care Provider:  Luretha Murphy NP  CC:  see triage note.Marland Kitchen  History of Present Illness: RUQ pain through to her back intermittent. Has episode of vaginal bleeding last week lasting 3 days, she has not had a period previous for 2 years.  She is nauseated after eating.  Her indigestion is worse than usual and she is taking Nexium (get via [facility]). She feels fatigued.  She has lost 7 pounds and is trying hard, but only drinking water and eating boiled eggs.  Instructed to increase veges and beans.  Reports having normal BMs.  Habits & Providers  Alcohol-Tobacco-Diet     Tobacco Status: never  Current Medications (verified): 1)  Hydrochlorothiazide 25 Mg  Tabs (Hydrochlorothiazide) .Marland Kitchen.. 1 Tab By Mouth Daily 2)  Diltiazem Hcl Coated Beads 180 Mg Xr24h-Cap (Diltiazem Hcl Coated Beads) .... One Daily 3)  Triamcinolone Acetonide 0.5 % Crea (Triamcinolone Acetonide) .... Apply To Aa Two Times A Day X 2 Wks.  Large Op 4)  Hydrocodone-Acetaminophen 7.5-750 Mg Tabs (Hydrocodone-Acetaminophen) .... One Three Times A Day As Needed Pain (May Fill On 08/21/09) 5)  Nexium 40 Mg Cpdr (Esomeprazole Magnesium) .... One Daily 6)  Flonase 50 Mcg/act Susp (Fluticasone Propionate) .... Tow Squirts in Each Nostril Daily 7)  Peg 3350  Powd (Polyethylene Glycol 3350) .Marland KitchenMarland KitchenMarland Kitchen 17 Gm in 4 Oz Water Two Times A Day For 3 Days Then Daily, Qs For One Month 8)  Flexeril 10 Mg Tabs (Cyclobenzaprine Hcl) .... One Three Times A Day As Needed Muscle Pain and Spasms 9)  Celexa 40 Mg Tabs (Citalopram Hydrobromide) ....  One Daily 10)  Trazodone Hcl 100 Mg Tabs (Trazodone Hcl) .... One Qhs  Allergies: 1)  ! Prednisone 2)  * Nortryptyline  Review of Systems General:  Complains of chills, sleep disorder, sweats, and weight loss; denies fever and loss of appetite; trying to lose weight. GI:  Complains of abdominal pain, indigestion, and nausea; denies bloody stools, constipation, gas, and vomiting. GU:  Denies discharge and dysuria; sexual contact 2 months ago.  Physical Exam  General:  Well appearing, obese, AA female Lungs:  normal respiratory effort.   Heart:  normal rate and regular rhythm.   Abdomen:  Very large abdomen, non tender, unable to appreciate any masses. Rectal:  sm brown soft stool heme neg Genitalia:  normal introitus, no external lesions, and no vaginal discharge.  Dry vaginal menbranes and cervical membranes, no cervical lesions.  Some blood on wet mount otherwise negative   Impression & Recommendations:  Problem # 1:  POSTMENOPAUSAL BLEEDING (ICD-627.1) Transvaginal US of uterus.  At risk for endometrial hyperplasia with morbid obesity Orders: GC/Chlamydia-[facility] (87591/87491) Ultrasound (Ultrasound) [facility]- Est  Level 4 (99214)Future Orders: CBC-[facility] ([address]109) ... 09/18/2010  Problem # 2:  ABDOMINAL PAIN, RIGHT UPPER QUADRANT (ICD-789.01) Urine without blood or WBCs.  WIll work up gallbaldder disease.  She has had many pain issues through  out the years.  May need abdominal CT if this persists and nothing shows on intial evlauation.  Pain meds filled.  consider KUB as well. Orders: Urinalysis-[facility] (00000) GC/Chlamydia-[facility] (87591/87491) Wet Prep- [facility] [phone]) Ultrasound (Ultrasound) [facility]- Est  Level 4 ([address]214)Future Orders: Amylase-[facility] ([id number]) ... 10/02/2010 Comp Met-[facility] ([id number]) ... 10/02/2010 CBC-[facility] (28413) ... 09/18/2010  Complete Medication List: 1)  Hydrochlorothiazide 25 Mg Tabs (Hydrochlorothiazide) .Marland Kitchen.. 1 tab by mouth daily 2)  Diltiazem Hcl Coated Beads 180 Mg  Xr24h-cap (Diltiazem hcl coated beads) .... One daily 3)  Triamcinolone Acetonide 0.5 % Crea (Triamcinolone acetonide) .... Apply to aa two times a day x 2 wks.  large op 4)  Hydrocodone-acetaminophen 7.5-750 Mg Tabs (Hydrocodone-acetaminophen) .... One three times a day as needed pain (may fill on 08/21/09) 5)  Nexium 40 Mg Cpdr (Esomeprazole magnesium) .... One daily 6)  Flonase 50 Mcg/act Susp (Fluticasone propionate) .... Tow squirts in each nostril daily 7)  Peg 3350 Powd (Polyethylene glycol 3350) .Marland KitchenMarland Kitchen. 17 gm in 4 oz water two times a day for 3 days then daily, qs for one month 8)  Flexeril 10 Mg Tabs (Cyclobenzaprine hcl) .... One three times a day as needed muscle pain and spasms 9)  Celexa 40 Mg Tabs (Citalopram hydrobromide) .... One daily 10)  Trazodone Hcl 100 Mg Tabs (Trazodone hcl) .... One qhs  Patient Instructions: 1)  I am not quite sure what is causing this pain. 2)  YOur urine is concentrated, keep pushing fluids 3)  Tests will be done to look for any problems with your uterus or gallbladder. 4)  I am on vacation next week but will make sure that someone calls.  Laboratory Results   Urine Tests  Date/Time Received: October 10, 2009 1:50 PM  Date/Time Reported: October 10, 2009 1:55 PM   Routine Urinalysis   Color: yellow Appearance: Clear Glucose: negative   (Normal Range: Negative) Bilirubin: negative   (Normal Range: Negative) Ketone: negative   (Normal Range: Negative) Spec. Gravity: >=1.030   (Normal Range: 1.003-1.035) Blood: negative   (Normal Range: Negative) pH: 5.5   (Normal Range: 5.0-8.0) Protein: negative   (Normal Range: Negative) Urobilinogen: 1.0   (Normal Range: 0-1) Nitrite: negative   (Normal Range: Negative) Leukocyte Esterace: negative   (Normal Range: Negative)    Comments: ...............test performed by......Marland KitchenBonnie A. Swaziland, MLS (ASCP)cm  Date/Time Received: October 10, 2009 1:58 PM  Date/Time Reported: October 10, 2009 2:03 PM   [employer] Source: vaginal WBC/hpf: 1-5 Bacteria/hpf: 3+  Rods Clue cells/hpf: none  Negative whiff Yeast/hpf: none Trichomonas/hpf: none Comments: occ RBC's present ...........test performed by...........Marland KitchenTerese Door, CMA

## 2010-05-21 NOTE — Progress Notes (Signed)
Summary: Rx  Phone Note Refill Request Call back at Home Phone [phone]   Refills Requested: Medication #1:  HYDROCODONE-ACETAMINOPHEN 7.5-750 MG TABS one three times a day as needed pain Initial call taken by: Knox Royalty,  April 22, 2010 11:49 AM    Prescriptions: HYDROCODONE-ACETAMINOPHEN 7.5-750 MG TABS (HYDROCODONE-ACETAMINOPHEN) one three times a day as needed pain  #90 x 0   Entered and Authorized by:   Luretha Murphy NP   Signed by:   Luretha Murphy NP on 04/22/2010   Method used:   Printed then faxed to ...       [facility]* (retail)       [address]       [address] Shipping/mailing       [place], [place]  [address]       Ph: [phone]       Fax: [phone]   RxID:   [id number]

## 2010-05-21 NOTE — Assessment & Plan Note (Signed)
Summary: toe infection per pt/[place]   Vital Signs:  Patient profile:   50 year old female Height:      68 inches Weight:      259 pounds BMI:     39.52 Temp:     98.4 degrees F oral Pulse rate:   82 / minute BP sitting:   126 / 78  (left arm) Cuff size:   large  Vitals Entered By: Jimmy Footman, CMA (January 20, 2010 11:13 AM) CC: ?toe infection left big toe Is Patient Diabetic? No Pain Assessment Patient in pain? yes     Location: toe Intensity: 7 Type: sharp   Primary Care Delwyn Scoggin:  Luretha Murphy NP  CC:  ?toe infection left big toe.  History of Present Illness: 50 y/o F with history of onychomycosis in toes presents to clinic for "toe infection".  Left great toe: thickened toe nail that is yellow.  It is painful to touch.  Presentt for months now.  This is similar to previous episodes of fungal infection.  Pt denies fever, chills, lost of mobility, decreased sensation, numbness.     ***Pt asked to have her Hydrocodone refilled today since she is in clinic.  Checked Med refill and it was refilled for #90 tabs on 12/29/09.  She takes this for chronic back pain.  Advised that it was too early for refill.  She can discuss with her PCP if she wants it refilled earlier.  Also discussed that pharmacy may decline to refill it since it was too early.   Habits & Providers  Alcohol-Tobacco-Diet     Tobacco Status: never  Current Medications (verified): 1)  Hydrochlorothiazide 25 Mg  Tabs (Hydrochlorothiazide) .Marland Kitchen.. 1 Tab By Mouth Daily 2)  Diltiazem Hcl Coated Beads 180 Mg Xr24h-Cap (Diltiazem Hcl Coated Beads) .... One Daily 3)  Hydrocodone-Acetaminophen 7.5-750 Mg Tabs (Hydrocodone-Acetaminophen) .... One Three Times A Day As Needed Pain 4)  Nexium 40 Mg Cpdr (Esomeprazole Magnesium) .... One Daily 5)  Flonase 50 Mcg/act Susp (Fluticasone Propionate) .... Tow Squirts in Each Nostril Daily 6)  Peg 3350  Powd (Polyethylene Glycol 3350) .Marland KitchenMarland KitchenMarland Kitchen 17 Gm in 4 Oz Water Two Times A Day  For 3 Days Then Daily, Qs For One Month 7)  Celexa 40 Mg Tabs (Citalopram Hydrobromide) .... One Daily 8)  Trazodone Hcl 100 Mg Tabs (Trazodone Hcl) .... One Qhs 9)  Hydrocortisone 2.5 % Crea (Hydrocortisone) .... Apply To Affected Area Daily, 30 Gm Tube 10)  Ibuprofen 800 Mg Tabs (Ibuprofen) .... One Three Times A Day As Needed Pain 11)  Provera 5 Mg Tabs (Medroxyprogesterone Acetate) .... One Daily (Until Seen By Gyn) 12)  Promethazine Hcl 25 Mg  Tabs (Promethazine Hcl) .... One By Mouth Q 6 Hours As Needed Nausea 13)  Lamisil 250 Mg Tabs (Terbinafine Hcl) .Marland Kitchen.. 1 Tab By Mouth Daily X 12 Weeks  Allergies (verified): 1)  ! Prednisone 2)  * Nortryptyline  Past History:  Past Medical History: Last updated: 06/09/2009 HepC dx`d 5/02 Positive Rheumatoid Factor (2/02) PSVT-followed by Algie Coffer 06/2002 PTSD rectal fissure via anoscopy 08/25/05 Hbg 10.3, stool studies neg 08/25/05/ Hemeoccult neg 09/03/05   MRI spine--L4-L5 disk bulge,L5-S1 disk hern. - 6/7/2002T-spine/L5,S1 - 05/24/2000 X-ray R thumb - fracture of volar plate (avulsion) - Xray - Spine(2/01) anterior osteophytes  Treadmill test 02/0 Depression GERD Hypertension  Past Surgical History: Last updated: 12/15/2007 ORIF R ankle bimalleolar fx 04/2005 - 06/07/2005, Treadmill test 02/01   Family History: Last updated: February 09, 2008 father-deceased, 30-40, MVA  mother-67 healthy   no family hx of skin malignancy no family hx of early MI  Risk Factors: Smoking Status: never (01/20/2010)  Social History: Smoking Status:  never  Review of Systems General:  Denies chills, fever, and loss of appetite. MS:  Complains of joint pain; denies joint redness, joint swelling, loss of strength, muscle weakness, and stiffness. Derm:  Complains of changes in nail beds; denies changes in color of skin and poor wound healing.  Physical Exam  General:  Well-developed,well-nourished,in no acute distress; alert,appropriate and cooperative  throughout examination. Vitals reviewed.    Extremities:  Left great toe: Nail changes include brittleness, thickened, discolored (yellow), with crumbling outter edges.  There is debride under the nail.  Nail is lifted up at distal edge.    +mild swelling around edge of nail bed, no erythema, no redness, no streaking, motility intact.  No foot swelling.  +2 pulses.      Impression & Recommendations:  Problem # 1:  ONYCHOMYCOSIS, TOENAILS (ICD-110.1) Assessment New Fungal infection of Left great toe.  There is mild swelling and tenderness around nail bed.  No redness.  No warmth.  Nail is thickened and discolored.  No deformity to other toe nails.  Likely not gout or cellulitis.  Will treat with Lamisil x 12 wks as this has worked for pt in the past.  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   Her updated medication list for this problem includes:    Lamisil 250 Mg Tabs (Terbinafine hcl) .Marland Kitchen... 1 tab by mouth daily x 12 weeks  Orders: Comp Met-[facility] ([id number]) [facility]- Est Level  3 ([address])  Complete Medication List: 1)  Hydrochlorothiazide 25 Mg Tabs (Hydrochlorothiazide) .Marland Kitchen.. 1 tab by mouth daily 2)  Diltiazem Hcl Coated Beads 180 Mg Xr24h-cap (Diltiazem hcl coated beads) .... One daily 3)  Hydrocodone-acetaminophen 7.5-750 Mg Tabs (Hydrocodone-acetaminophen) .... One three times a day as needed pain 4)  Nexium 40 Mg Cpdr (Esomeprazole magnesium) .... One daily 5)  Flonase 50 Mcg/act Susp (Fluticasone propionate) .... Tow squirts in each nostril daily 6)  Peg 3350 Powd (Polyethylene glycol 3350) .Marland KitchenMarland Kitchen. 17 gm in 4 oz water two times a day for 3 days then daily, qs for one month 7)  Celexa 40 Mg Tabs (Citalopram hydrobromide) .... One daily 8)  Trazodone Hcl 100 Mg Tabs (Trazodone hcl) .... One qhs 9)  Hydrocortisone 2.5 % Crea (Hydrocortisone) .... Apply to affected area daily, 30 gm tube 10)  Ibuprofen 800 Mg Tabs (Ibuprofen) .... One three times a day as needed  pain 11)  Provera 5 Mg Tabs (Medroxyprogesterone acetate) .... One daily (until seen by gyn) 12)  Promethazine Hcl 25 Mg Tabs (Promethazine hcl) .... One by mouth q 6 hours as needed nausea 13)  Lamisil 250 Mg Tabs (Terbinafine hcl) .Marland Kitchen.. 1 tab by mouth daily x 12 weeks Prescriptions: LAMISIL 250 MG TABS (TERBINAFINE HCL) 1 tab by mouth daily x 12 weeks  #90 x 0   Entered and Authorized by:   Angeline Slim MD   Signed by:   Angeline Slim MD on 01/20/2010   Method used:   Faxed to ...       [facility] (retail)       [address] [place], [place]  [address]       Ph: [phone]       Fax: [phone]   RxID:   [id number] LAMISIL 250 MG TABS (TERBINAFINE HCL)  1 tab by mouth daily x 12 weeks  #90 x 0   Entered and Authorized by:   Angeline Slim MD   Signed by:   Angeline Slim MD on 01/20/2010   Method used:   Electronically to        [facility]* (retail)       [address]       [address] Shipping/mailing       [place], [place]  [address]       Ph: [phone]       Fax: [phone]   RxID:   [id number]

## 2010-05-21 NOTE — Progress Notes (Signed)
Summary: Rx request/[place]  Phone Note Call from Patient Call back at Home Phone [phone]   Reason for Call: Refill Medication Summary of Call: pt wants to see if Luretha Murphy can give her a refill on a cream [employer] Wartman prescribed for her when she was breaking out on her face.  Initial call taken by: Knox Royalty,  November 03, 2009 2:35 PM  Follow-up for Phone Call        fwd. to S.[name] for review. Follow-up by: Arlyss Repress CMA,,  November 03, 2009 4:29 PM    New/Updated Medications: HYDROCORTISONE 2.5 % CREA (HYDROCORTISONE) apply to affected area daily, 30 gm tube Prescriptions: HYDROCORTISONE 2.5 % CREA (HYDROCORTISONE) apply to affected area daily, 30 gm tube  #1 x 3   Entered and Authorized by:   Luretha Murphy NP   Signed by:   Luretha Murphy NP on 11/04/2009   Method used:   Electronically to        [facility] [phone]* (retail)       [address]       [place], [place]  [address]       Ph: [phone]       Fax: [phone]   RxID:   [id number]

## 2010-05-21 NOTE — Miscellaneous (Signed)
Summary: walk in  Clinical Lists Changes fell on porch last night & skinned r knee. wants to be seen. had cleaned it with soap & water & bandaged it. looked clean. appt at 11 work in today.Golden Circle RN  Aug 20, 2009 10:54 AM  Patient, no showed from follow up apt yesterday. Luretha Murphy NP  Aug 20, 2009 12:25 PM

## 2010-05-21 NOTE — Progress Notes (Signed)
  Phone Note Call from Patient   Summary of Call: pt called to cancel appt. feeling better. was not able to cancel over weeked Initial call taken by: Jimmy Footman, CMA,  January 12, 2010 11:05 AM    Additional Follow-up for Phone Call Additional follow up Details #2::    called asking for appt for continuing shoulder pain. no appt left for today & she declined use of UC. appt on monday. aware she will not be seeing her pcp Follow-up by: Golden Circle RN,  January 09, 2010 12:31 PM  called to cxl appt for today - she is feeling better. De Nurse  January 12, 2010 9:36 AM

## 2010-05-21 NOTE — Assessment & Plan Note (Signed)
Summary: L leg pain.see notes/[place]/Levaeh Vice   Vital Signs:  Patient profile:   50 year old female Height:      68 inches Weight:      262 pounds BMI:     39.98 Temp:     98.1 degrees F oral Pulse rate:   91 / minute BP sitting:   143 / 93  (left arm) Cuff size:   large  Vitals Entered By: Tessie Fass, CMA CC: bilateral hip pain upon walking, sitting or bending Is Patient Diabetic? No Pain Assessment Patient in pain? yes     Location: hip Intensity: 6   Primary Care Provider:  Luretha Murphy NP  CC:  bilateral hip pain upon walking and sitting or bending.  History of Present Illness: Back pain worse over past several weeks, describes as different than usual pain.   Pain in right buttocks, does not radiate down leg.  Incresed with sitting and bending.    Long standing chronic back pain issues, last MRI of spine in 2002, with multiple levels of disc disease.  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  Truncal obesity also an issue, she has not been able to lose weight.  Employed use of Topamax to help with weight loss and pain with little change. On multiple meds for pain control.  Habits & Providers  Alcohol-Tobacco-Diet     Tobacco Status: quit  Current Medications (verified): 1)  Hydrochlorothiazide 25 Mg  Tabs (Hydrochlorothiazide) .Marland Kitchen.. 1 Tab By Mouth Daily 2)  Diltiazem Hcl Coated Beads 180 Mg Xr24h-Cap (Diltiazem Hcl Coated Beads) .... One Daily 3)  Sertraline Hcl 100 Mg Tabs (Sertraline Hcl) .... One Daily 4)  Hydrocortisone 2.5 % Oint (Hydrocortisone) .... Apply To Affected Areas Three Times A Day.  Continue 2 Days After All Symptoms Resolve. 5)  Hydrocodone-Acetaminophen 7.5-750 Mg Tabs (Hydrocodone-Acetaminophen) .... One Three Times A Day As Needed Pain 6)  Topamax 100 Mg Tabs (Topiramate) .... 2 Tabs of The 50 or One Tab of 100 Mg, 7)  Amitriptyline Hcl 50 Mg Tabs (Amitriptyline Hcl) .... One Tab Qhs 8)  Nexium 40 Mg Cpdr (Esomeprazole Magnesium)  .... One Daily 9)  Flonase 50 Mcg/act Susp (Fluticasone Propionate) .... Tow Squirts in Each Nostril Daily 10)  Peg 3350  Powd (Polyethylene Glycol 3350) .Marland KitchenMarland KitchenMarland Kitchen 17 Gm in 4 Oz Water Two Times A Day For 3 Days Then Daily, Qs For One Month 11)  Gabapentin 300 Mg Caps (Gabapentin) .... One in Am and 2 in Pm 12)  Flexeril 5 Mg Tabs (Cyclobenzaprine Hcl) .Marland Kitchen.. 1 Tab By Mouth Q 6 Hrs As Needed Pain 13)  Diclofenac Sodium 75 Mg Tbec (Diclofenac Sodium) .... One Tab Two Times A Day  Allergies (verified): 1)  ! Prednisone  Review of Systems MS:  Complains of low back pain.  Physical Exam  General:  Usual appearing, truncal obesity. Msk:  Negative straight leg raise both sitting and lying.  Pain whe stretching right SI joint.  DTR +2 at knees   Impression & Recommendations:  Problem # 1:  SACROILIITIS, RIGHT (ICD-720.2)  NSAIDS, ice, physcial therapy, [facility] to [facility], apt with [facility] for review of case.  Orders: [facility]- Est Level  2 (16109)  Problem # 2:  CHRONIC PAIN SYNDROME (ICD-338.4) Refilled narcotics, changed NSAID to Voltaren  Complete Medication List: 1)  Hydrochlorothiazide 25 Mg Tabs (Hydrochlorothiazide) .Marland Kitchen.. 1 tab by mouth daily 2)  Diltiazem Hcl Coated Beads 180 Mg Xr24h-cap (Diltiazem hcl coated beads) .... One daily  3)  Sertraline Hcl 100 Mg Tabs (Sertraline hcl) .... One daily 4)  Hydrocortisone 2.5 % Oint (Hydrocortisone) .... Apply to affected areas three times a day.  continue 2 days after all symptoms resolve. 5)  Hydrocodone-acetaminophen 7.5-750 Mg Tabs (Hydrocodone-acetaminophen) .... One three times a day as needed pain 6)  Topamax 100 Mg Tabs (Topiramate) .... 2 tabs of the 50 or one tab of 100 mg, 7)  Amitriptyline Hcl 50 Mg Tabs (Amitriptyline hcl) .... One tab qhs 8)  Nexium 40 Mg Cpdr (Esomeprazole magnesium) .... One daily 9)  Flonase 50 Mcg/act Susp (Fluticasone propionate) .... Tow squirts in each nostril daily 10)  Peg 3350 Powd (Polyethylene glycol 3350)  .Marland KitchenMarland Kitchen. 17 gm in 4 oz water two times a day for 3 days then daily, qs for one month 11)  Gabapentin 300 Mg Caps (Gabapentin) .... One in am and 2 in pm 12)  Flexeril 5 Mg Tabs (Cyclobenzaprine hcl) .Marland Kitchen.. 1 tab by mouth q 6 hrs as needed pain 13)  Diclofenac Sodium 75 Mg Tbec (Diclofenac sodium) .... One tab two times a day  Patient Instructions: 1)  Please schedule a follow-up appointment in 1 month., PAP, CPE 2)  Apt with [facility] for SI joint pain Prescriptions: HYDROCODONE-ACETAMINOPHEN 7.5-750 MG TABS (HYDROCODONE-ACETAMINOPHEN) one three times a day as needed pain Brand medically necessary #90 x 0   Entered and Authorized by:   Luretha Murphy NP   Signed by:   Luretha Murphy NP on 05/02/2009   Method used:   Print then Give to Patient   RxID:   [id number] DICLOFENAC SODIUM 75 MG TBEC (DICLOFENAC SODIUM) one tab two times a day Brand medically necessary #60 x 2   Entered and Authorized by:   Luretha Murphy NP   Signed by:   Luretha Murphy NP on 05/02/2009   Method used:   Print then Give to Patient   RxID:   [id number]      Prevention & Chronic Care Immunizations   Influenza vaccine: Fluvax Non-MCR  (02/07/2009)   Influenza vaccine due: 01/18/2009    Tetanus booster: 01/17/2002: Done.   Tetanus booster due: 01/18/2012    Pneumococcal vaccine: Not documented  Colorectal Screening   Hemoccult: Not documented    Colonoscopy: Not documented  Other Screening   Pap smear: normal  (01/05/2007)   Pap smear due: 01/04/2009    Mammogram: ASSESSMENT: Negative - BI-RADS 1^MM DIGITAL SCREENING  (05/23/2008)   Mammogram due: 05/23/2009   Smoking status: quit  (05/02/2009)  Lipids   Total Cholesterol: 205  (05/31/2008)   LDL: 107  (05/31/2008)   LDL Direct: 132  (12/27/2006)   HDL: 67  (05/31/2008)   Triglycerides: 154  (05/31/2008)  Hypertension   Last Blood Pressure: 143 / 93  (05/02/2009)   Serum creatinine: 0.96  (09/25/2008)   Serum potassium 4.0   (09/25/2008)  Self-Management Support :    Hypertension self-management support: Not documented
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
## 2010-05-21 NOTE — Progress Notes (Signed)
  Phone Note Call from Patient   Caller: Patient Summary of Call: Need to come in for appt today.  having migraines.  Ph# is [phone] Initial call taken by: Britta Mccreedy mcgregor  Follow-up for Phone Call        LM Follow-up by: Golden Circle RN,  December 16, 2009 2:57 PM  Additional Follow-up for Phone Call Additional follow up Details #1::        has a migraine x 3 days. aleve helps some but it comes back. laying down now.  told her we have no appts left. suggested UC. she will go there today Additional Follow-up by: Golden Circle RN,  December 16, 2009 3:00 PM

## 2010-05-21 NOTE — Progress Notes (Signed)
Summary: Rx Req  Phone Note Refill Request Call back at Home Phone [phone] Message from:  Patient  Refills Requested: Medication #1:  TRAZODONE HCL 100 MG TABS one qhs [facility] [address]  Initial call taken by: Clydell Hakim,  February 12, 2010 10:51 AM    New/Updated Medications: TRAZODONE HCL 100 MG TABS (TRAZODONE HCL) one qhs Prescriptions: TRAZODONE HCL 100 MG TABS (TRAZODONE HCL) one qhs  #30 x 6   Entered and Authorized by:   Luretha Murphy NP   Signed by:   Luretha Murphy NP on 02/12/2010   Method used:   Electronically to        [facility] [phone]* (retail)       [address]       [place], [place]  [address]       Ph: [phone]       Fax: [phone]   RxID:   [id number]

## 2010-05-21 NOTE — Progress Notes (Signed)
Summary: Triage  Phone Note Call from Patient Call back at Home Phone [phone]   Reason for Call: Talk to Nurse Summary of Call: pt has used almost all of the cream that was prescribed for her arm & its still itching Initial call taken by: Knox Royalty,  July 03, 2009 2:54 PM  Follow-up for Phone Call        states the arm is still itching badly & she is out of the rx hydrosortisone creme. cannot take benadryl. appt made for tomorrow Follow-up by: Golden Circle RN,  July 03, 2009 3:02 PM

## 2010-05-21 NOTE — Assessment & Plan Note (Signed)
Summary: cpe,tcb   Vital Signs:  Patient profile:   50 year old female Weight:      268.4 pounds Temp:     98.1 degrees F oral Pulse rate:   106 / minute BP sitting:   149 / 71  (right arm) Cuff size:   large  Vitals Entered By: Loralee Pacas CMA (June 09, 2009 3:18 PM)  Primary Care Provider:  Luretha Murphy NP   History of Present Illness: Patient for CPE and Pap.  reports itchy rash to face, neck and arms that she noticed last week, no known skin irritants, has used cortisone cream to area without significant relief.  reports she has not changed skin care regimen, no new foods or medication.  last medication change was for Voltaren greater than two weeks ago.  C/o continued muscle and SI joint pain that worsens with sitting or standing for long periods, reports she has been taking Voltaren as prescribed but does not feel that it is adequately controlling pain.  Had spoken with facility for Rehab but b/c she is having car trouble have not been able to be seen.  Verbalizes plans to initiate rehab as soon as she can get reliable transportation.  Allergies (verified): 1)  ! Prednisone  Past History:  Past Medical History: HepC dx`d 5/02 Positive Rheumatoid Factor (2/02) PSVT-followed by [facility] 06/2002 PTSD rectal fissure via anoscopy 08/25/05 Hbg 10.3, stool studies neg 08/25/05/ Hemeoccult neg 09/03/05   MRI spine--L4-L5 disk bulge,L5-S1 disk hern. - 6/7/2002T-spine/L5,S1 - 05/24/2000 X-ray R thumb - fracture of volar plate (avulsion) - Xray - Spine(2/01) anterior osteophytes  Treadmill test 02/0 Depression GERD Hypertension  Past Surgical History: Reviewed history from 12/15/2007 and no changes required. ORIF R ankle bimalleolar fx 04/2005 - 06/07/2005, Treadmill test 02/01   Family History: Reviewed history from 02/01/2008 and no changes required. father-deceased, 30-40, MVA mother-67 healthy   no family hx of skin malignancy no family hx of early MI  Social  History: Son= Jayquan b. 1999  Daughter--Shaquita.; Husband murdered 6/01.  No tob, EtoH, drugs. No drug use.  No previous blood transfusions.  Has a son who is incarcerated 10/2004.    Best contact #: [phone] (cell)  Review of Systems General:  Denies chills, fatigue, fever, and malaise. Eyes:  Complains of vision loss-both eyes; denies discharge, eye pain, halos, and red eye; Reports poor vision. ENT:  Denies decreased hearing, difficulty swallowing, earache, nasal congestion, ringing in ears, and sore throat. CV:  Denies chest pain or discomfort, difficulty breathing at night, lightheadness, palpitations, shortness of breath with exertion, swelling of feet, and swelling of hands. Resp:  Denies chest discomfort, cough, shortness of breath, sputum productive, and wheezing. GI:  Denies abdominal pain, change in bowel habits, constipation, diarrhea, indigestion, loss of appetite, nausea, and vomiting. GU:  Denies abnormal vaginal bleeding, discharge, dysuria, genital sores, and urinary frequency. MS:  Complains of joint pain, low back pain, and muscle aches; denies joint redness, joint swelling, loss of strength, and muscle weakness. Derm:  Complains of changes in color of skin, itching, and rash; denies dryness and excessive perspiration. Neuro:  Complains of tingling; denies difficulty with concentration, disturbances in coordination, falling down, headaches, memory loss, numbness, poor balance, seizures, visual disturbances, and weakness. Endo:  Denies cold intolerance, excessive hunger, excessive thirst, excessive urination, heat intolerance, and polyuria. Heme:  Denies abnormal bruising, bleeding, enlarge lymph nodes, and fevers.  Physical Exam  General:  overweight appearing,in no acute distress; alert,appropriate and cooperative throughout examination  Head:  Normocephalic and atraumatic without obvious abnormalities. No apparent alopecia or balding. Eyes:  No corneal or conjunctival  inflammation noted. EOMI. Perrla.  Vision grossly normal. Ears:  External ear exam shows no significant lesions or deformities.  Otoscopic examination reveals clear canals, tympanic membranes are intact bilaterally without bulging, retraction, inflammation or discharge. Hearing is grossly normal bilaterally. Nose:  External nasal examination shows no deformity or inflammation. Nasal mucosa are pink and moist without lesions or exudates. Mouth:  Oral mucosa and oropharynx without lesions or exudates.  Teeth in good repair. Neck:  No deformities, masses, or tenderness noted. Chest Wall:  No deformities, masses, or tenderness noted. Breasts:  No mass, nodules, thickening, tenderness, bulging, retraction, inflamation, nipple discharge or skin changes noted.   Lungs:  Normal respiratory effort, chest expands symmetrically. Lungs are clear to auscultation, no crackles or wheezes. Heart:  Normal rate and regular rhythm. S1 and S2 normal without gallop, murmur, click, rub or other extra sounds. Abdomen:  Bowel sounds positive,abdomen soft and non-tender without masses, organomegaly or hernias noted. Genitalia:  Normal introitus for age, no external lesions, small amount thin white vaginal discharge, mucosa pink and moist, no vaginal or cervical lesions, no vaginal atrophy, no friaility or hemorrhage, normal uterus size and position, no adnexal masses or tenderness Msk:  No deformity or scoliosis noted of thoracic or lumbar spine.   Pulses:  R and L carotid,radial,femoral,dorsalis pedis and posterior tibial pulses are full and equal bilaterally Extremities:  No clubbing, cyanosis, edema, or deformity noted with normal full range of motion of all joints.   Neurologic:  No cranial nerve deficits noted. Station and gait are normal. DTRs are symmetrical throughout. Sensory, motor and coordinative functions appear intact. Skin:  Blanchable, chicken skin appearing rash to anterior neck, bilateral posterior forearms  and hands. Cervical Nodes:  No lymphadenopathy noted Axillary Nodes:  No palpable lymphadenopathy Inguinal Nodes:  No significant adenopathy Psych:  Cognition and judgment appear intact. Alert and cooperative with normal attention span and concentration. No apparent delusions, illusions, hallucinations   Impression & Recommendations:  Problem # 1:  RASH AND OTHER NONSPECIFIC SKIN ERUPTION (ICD-782.1) Will refill medication, to follow up if this does not clear. Her updated medication list for this problem includes:    Hydrocortisone 2.5 % Oint (Hydrocortisone) .Marland Kitchen... Apply to affected areas three times a day.  continue 2 days after all symptoms resolve.  Problem # 2:  Gynecological examination-routine (ICD-V72.31) Routine wet prep, G/C chlamydia and Pap specimens obtained, awtg results.  Problem # 3:  CHRONIC PAIN SYNDROME (ICD-338.4) Continue medications as prescribed.  Patient to follow up with physical therapy to initate treatment.  Problem # 4:  HYPERTENSION (ICD-401.9) Will likely add ACE at next visit. Her updated medication list for this problem includes:    Hydrochlorothiazide 25 Mg Tabs (Hydrochlorothiazide) .Marland Kitchen... 1 tab by mouth daily    Diltiazem Hcl Coated Beads 180 Mg Xr24h-cap (Diltiazem hcl coated beads) ..... One daily  Orders: [facility]- Est  Level 4 ([address]109)  Complete Medication List: 1)  Hydrochlorothiazide 25 Mg Tabs (Hydrochlorothiazide) .Marland Kitchen.. 1 tab by mouth daily 2)  Diltiazem Hcl Coated Beads 180 Mg Xr24h-cap (Diltiazem hcl coated beads) .... One daily 3)  Sertraline Hcl 100 Mg Tabs (Sertraline hcl) .... One daily 4)  Hydrocortisone 2.5 % Oint (Hydrocortisone) .... Apply to affected areas three times a day.  continue 2 days after all symptoms resolve. 5)  Hydrocodone-acetaminophen 7.5-750 Mg Tabs (Hydrocodone-acetaminophen) .... One three times a day as needed  pain 6)  Topamax 100 Mg Tabs (Topiramate) .... 2 tabs of the 50 or one tab of 100 mg, 7)  Amitriptyline Hcl 50  Mg Tabs (Amitriptyline hcl) .... One tab qhs 8)  Nexium 40 Mg Cpdr (Esomeprazole magnesium) .... One daily 9)  Flonase 50 Mcg/act Susp (Fluticasone propionate) .... Tow squirts in each nostril daily 10)  Peg 3350 Powd (Polyethylene glycol 3350) .Marland KitchenMarland Kitchen. 17 gm in 4 oz water two times a day for 3 days then daily, qs for one month 11)  Gabapentin 300 Mg Caps (Gabapentin) .... One in am and 2 in pm 12)  Flexeril 5 Mg Tabs (Cyclobenzaprine hcl) .Marland Kitchen.. 1 tab by mouth q 6 hrs as needed pain 13)  Diclofenac Sodium 75 Mg Tbec (Diclofenac sodium) .... One tab two times a day  Other Orders: GC/Chlamydia-[facility] (87591/87491) Pap Smear-[facility] (04540-98119) Direct LDL-[facility] [phone]) HIV-[facility] ([address]-78469) [facility]  Patient Instructions: 1)  Use hydrocortisone cream to affected areas sparingly twice daily.   2)  Await results of lab testing and Pap smear. 3)  Please schedule mammogram and vision exam. 4)  Follow up with physical therapy so that you can have treatment initiated for your chronic pain. Prescriptions: AMITRIPTYLINE HCL 50 MG TABS (AMITRIPTYLINE HCL) one tab qhs Brand medically necessary #30 x 3   Entered and Authorized by:   Luretha Murphy NP   Signed by:   Luretha Murphy NP on 06/09/2009   Method used:   Print then Give to Patient   RxID:   [id number] HYDROCORTISONE 2.5 % OINT (HYDROCORTISONE) Apply to affected areas three times a day.  Continue 2 days after all symptoms resolve. Brand medically necessary #1 x 2   Entered and Authorized by:   Luretha Murphy NP   Signed by:   Luretha Murphy NP on 06/09/2009   Method used:   Print then Give to Patient   RxID:   [id number]   Laboratory Results  Date/Time Received: June 09, 2009 4:15 PM Date/Time Reported: June 09, 2009 4:58 PM   Wet [place] Source: vag WBC/hpf: 5-10 Bacteria/hpf: 2+  Rods Clue cells/hpf: none  Negative whiff Yeast/hpf: none Trichomonas/hpf: none Comments: ...............test performed  by......Marland KitchenBonnie A. [place], MLS (ASCP)cm      Prevention & Chronic Care Immunizations   Influenza vaccine: Fluvax Non-MCR  (02/07/2009)   Influenza vaccine due: 01/18/2009    Tetanus booster: 01/17/2002: Done.   Tetanus booster due: 01/18/2012    Pneumococcal vaccine: Not documented  Colorectal Screening   Hemoccult: Not documented    Colonoscopy: Not documented  Other Screening   Pap smear: normal  (01/05/2007)   Pap smear due: 01/04/2009    Mammogram: ASSESSMENT: Negative - BI-RADS 1^MM DIGITAL SCREENING  (05/23/2008)   Mammogram due: 05/23/2009   Smoking status: quit  (05/02/2009)  Lipids   Total Cholesterol: 205  (05/31/2008)   LDL: 107  (05/31/2008)   LDL Direct: 132  (12/27/2006)   HDL: 67  (05/31/2008)   Triglycerides: 154  (05/31/2008)  Hypertension   Last Blood Pressure: 149 / 71  (06/09/2009)   Serum creatinine: 0.96  (09/25/2008)   Serum potassium 4.0  (09/25/2008)    Hypertension flowsheet reviewed?: Yes   Progress toward BP goal: Unchanged  Self-Management Support :    Hypertension self-management support: Not documented

## 2010-05-25 ENCOUNTER — Ambulatory Visit: Payer: Self-pay | Admitting: Physical Therapy

## 2010-05-27 ENCOUNTER — Encounter: Payer: Self-pay | Admitting: Physical Therapy
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
## 2010-05-28 ENCOUNTER — Encounter: Payer: Self-pay | Admitting: Physical Therapy

## 2010-06-01 ENCOUNTER — Ambulatory Visit: Payer: Self-pay

## 2010-06-02 ENCOUNTER — Encounter: Payer: Self-pay | Admitting: Family Medicine

## 2010-06-02 ENCOUNTER — Ambulatory Visit (INDEPENDENT_AMBULATORY_CARE_PROVIDER_SITE_OTHER): Payer: Self-pay | Admitting: Family Medicine

## 2010-06-02 DIAGNOSIS — IMO0002 Reserved for concepts with insufficient information to code with codable children: Secondary | ICD-10-CM

## 2010-06-02 DIAGNOSIS — G894 Chronic pain syndrome: Secondary | ICD-10-CM

## 2010-06-02 DIAGNOSIS — Z111 Encounter for screening for respiratory tuberculosis: Secondary | ICD-10-CM

## 2010-06-02 DIAGNOSIS — L301 Dyshidrosis [pompholyx]: Secondary | ICD-10-CM | POA: Insufficient documentation

## 2010-06-02 DIAGNOSIS — R451 Restlessness and agitation: Secondary | ICD-10-CM

## 2010-06-02 MED ORDER — TRIAMCINOLONE ACETONIDE 0.1 % EX CREA: TOPICAL_CREAM | Freq: Two times a day (BID) | CUTANEOUS | Status: DC

## 2010-06-02 MED ORDER — CLONAZEPAM 0.5 MG PO TABS: 0.5000 mg | ORAL_TABLET | Freq: Two times a day (BID) | ORAL | Status: AC

## 2010-06-02 NOTE — Assessment & Plan Note (Signed)
Change from hydrocortisone to fluorinated ointment with a way to seal it to hands with gloves during the night

## 2010-06-02 NOTE — Assessment & Plan Note (Signed)
Improved on scheduled clonazepam, will reduce citalopram to 1/2 tab

## 2010-06-02 NOTE — Progress Notes (Signed)
  Subjective:    Patient ID: Norma [name], female    DOB: June 04, 1960, 50 y.o.   MRN: [id number]  HPI : Dry red patches on hands, has never had this before.  Seems to have developed with the colder temperatures.  She occasionally uses the hydrocortisone cream supplied.  Completed PT for her shoulder and it is improved, less pain at night.  She is calmer and less agitated on the clonazepam.    Review of Systems  Constitutional: Negative for fever.  HENT: Negative for facial swelling.   Musculoskeletal: Negative for joint swelling.  Psychiatric/Behavioral: Positive for dysphoric mood and agitation.       Objective:   Physical Exam  Constitutional: She appears well-developed.  Skin:       Red, raised, dry patches on hands   Psychiatric:       Less agitated and less confrontational          Assessment & Plan:

## 2010-06-02 NOTE — Patient Instructions (Addendum)
Cancel apt. For procedure clinic 2/16 Use ointment, keep sealed with either plastic wrap, or cotton gloves

## 2010-06-02 NOTE — Assessment & Plan Note (Signed)
No acute pain complaints today, shoulder better after PT, reinforced importance of continuing home program.

## 2010-06-04 ENCOUNTER — Ambulatory Visit: Payer: PRIVATE HEALTH INSURANCE

## 2010-06-09 ENCOUNTER — Ambulatory Visit (INDEPENDENT_AMBULATORY_CARE_PROVIDER_SITE_OTHER): Payer: Self-pay | Admitting: *Deleted

## 2010-06-09 DIAGNOSIS — Z111 Encounter for screening for respiratory tuberculosis: Secondary | ICD-10-CM

## 2010-06-09 NOTE — Progress Notes (Signed)
Patient did not return to have PPD read that was placed on 06/02/2010. Consulted with Dr. Jennette Kettle and she advises 0K to place again today.

## 2010-06-12 ENCOUNTER — Ambulatory Visit (INDEPENDENT_AMBULATORY_CARE_PROVIDER_SITE_OTHER): Payer: Self-pay | Admitting: Family Medicine

## 2010-06-12 ENCOUNTER — Encounter: Payer: Self-pay | Admitting: Family Medicine

## 2010-06-12 VITALS — BP 111/72 | HR 76 | Ht 68.0 in | Wt 255.8 lb

## 2010-06-12 DIAGNOSIS — L918 Other hypertrophic disorders of the skin: Secondary | ICD-10-CM

## 2010-06-12 DIAGNOSIS — L919 Hypertrophic disorder of the skin, unspecified: Secondary | ICD-10-CM

## 2010-06-12 DIAGNOSIS — I10 Essential (primary) hypertension: Secondary | ICD-10-CM

## 2010-06-12 DIAGNOSIS — L909 Atrophic disorder of skin, unspecified: Secondary | ICD-10-CM

## 2010-06-12 MED ORDER — HYDROCHLOROTHIAZIDE 25 MG PO TABS: 25.0000 mg | ORAL_TABLET | Freq: Every day | ORAL | Status: DC

## 2010-06-12 NOTE — Assessment & Plan Note (Signed)
At goal refilled meds

## 2010-06-12 NOTE — Patient Instructions (Signed)
Vitamin D, 2000 mg daily Fresh food, baked meat, frozen veges Call if HD does not have the triamcinalone Return prn

## 2010-06-12 NOTE — Progress Notes (Signed)
  Subjective:    Patient ID: Norma [name], female    DOB: 1960/06/24, 50 y.o.   MRN: [id number]  HPI: elevated mole on right neck that is irritating and 'painfull'.  She needs her BP meds refilled and TB test read with a note stating such.  Reviewed diet and eating frozen dinners and taking a weight loss supplement per Dr. Neil Crouch.    Review of Systems  Constitutional: Negative for fatigue.  HENT: Negative for facial swelling.   Cardiovascular: Negative for chest pain.  Psychiatric/Behavioral: Negative for agitation.       Objective:   Physical Exam  Constitutional: She appears well-developed and well-nourished.       Morbidly obese  Skin: Skin is warm.       Raised skin tag on right neck and left temple.    Psychiatric: She has a normal mood and affect.          Assessment & Plan:

## 2010-06-12 NOTE — Assessment & Plan Note (Signed)
Froze areas with liquid nitrogen, pt tolerated

## 2010-06-17 ENCOUNTER — Telehealth: Payer: Self-pay | Admitting: Family Medicine

## 2010-06-17 ENCOUNTER — Emergency Department (HOSPITAL_COMMUNITY): Payer: Self-pay

## 2010-06-17 ENCOUNTER — Emergency Department (HOSPITAL_COMMUNITY)
Admission: EM | Admit: 2010-06-17 | Discharge: 2010-06-17 | Disposition: A | Discharge status: Home / Self Care | Payer: Self-pay | Attending: Emergency Medicine | Admitting: Emergency Medicine

## 2010-06-17 DIAGNOSIS — M25519 Pain in unspecified shoulder: Secondary | ICD-10-CM | POA: Insufficient documentation

## 2010-06-17 DIAGNOSIS — I10 Essential (primary) hypertension: Secondary | ICD-10-CM | POA: Insufficient documentation

## 2010-06-17 DIAGNOSIS — G894 Chronic pain syndrome: Secondary | ICD-10-CM

## 2010-06-17 MED ORDER — HYDROCODONE-ACETAMINOPHEN 7.5-750 MG PO TABS: 1.0000 | ORAL_TABLET | Freq: Three times a day (TID) | ORAL | Status: DC | PRN

## 2010-06-17 NOTE — Telephone Encounter (Signed)
Pt is requesting refill on hydrocodone called to [facility]

## 2010-06-17 NOTE — Telephone Encounter (Signed)
Will fax hardcopy to [facility] outpatient pharmacy

## 2010-06-23 ENCOUNTER — Telehealth: Payer: Self-pay | Admitting: Family Medicine

## 2010-06-23 NOTE — Telephone Encounter (Signed)
Spoke with patient recommended ice and ibuprofen.  She has plenty of pain meds and has clonazepam to help with muscle relaxation.

## 2010-06-23 NOTE — Telephone Encounter (Signed)
Says she did something while in the shower yesterday (twisted) and now she's having spasms in her left shoulder.  They are so bad she can hardly move her left arm.  Says that she has done this in the past but can't remember what Darl Pikes did for her.  Heat is not working.  Told her I would route this note to Luretha Murphy for advice.

## 2010-06-23 NOTE — Telephone Encounter (Signed)
Having muscle spasm in shoulder and she put heat on it - not working Wants to talk to nurse

## 2010-07-06 ENCOUNTER — Other Ambulatory Visit: Payer: Self-pay | Admitting: Family Medicine

## 2010-07-06 DIAGNOSIS — Z1231 Encounter for screening mammogram for malignant neoplasm of breast: Secondary | ICD-10-CM

## 2010-07-07 ENCOUNTER — Ambulatory Visit (INDEPENDENT_AMBULATORY_CARE_PROVIDER_SITE_OTHER): Payer: Self-pay | Admitting: Family Medicine

## 2010-07-07 ENCOUNTER — Encounter: Payer: Self-pay | Admitting: Family Medicine

## 2010-07-07 VITALS — BP 119/78 | HR 80 | Temp 98.2°F | Ht 68.0 in | Wt 254.0 lb

## 2010-07-07 DIAGNOSIS — F339 Major depressive disorder, recurrent, unspecified: Secondary | ICD-10-CM

## 2010-07-07 DIAGNOSIS — G894 Chronic pain syndrome: Secondary | ICD-10-CM

## 2010-07-07 DIAGNOSIS — R109 Unspecified abdominal pain: Secondary | ICD-10-CM

## 2010-07-07 DIAGNOSIS — IMO0002 Reserved for concepts with insufficient information to code with codable children: Secondary | ICD-10-CM

## 2010-07-07 DIAGNOSIS — K5900 Constipation, unspecified: Secondary | ICD-10-CM

## 2010-07-07 LAB — POCT URINALYSIS DIPSTICK
Bilirubin, UA: NEGATIVE
Blood, UA: NEGATIVE
Ketones, UA: NEGATIVE
pH, UA: 5.5

## 2010-07-07 MED ORDER — POLYETHYLENE GLYCOL 3350 17 GM/SCOOP PO POWD: 17.0000 g | Freq: Two times a day (BID) | ORAL | Status: DC

## 2010-07-07 NOTE — Assessment & Plan Note (Signed)
Remains on pain contract of hydrocodone #90 per month

## 2010-07-07 NOTE — Patient Instructions (Signed)
Lots of fluids Resume diet high in whole foods and less meat and junk food

## 2010-07-07 NOTE — Assessment & Plan Note (Signed)
On both a sedating and activating SSRI, along with clonazepam.  No signs of seratonin syndrime.  Tricyclic used to control chronic pain pattern.

## 2010-07-07 NOTE — Assessment & Plan Note (Signed)
Very difficult to sort out all of the pain complaints, this particular one was worked up with an abdominal US 6 months ago.  She had a normal appearing gallbladder on imaging, with documented fatter infiltration of the liver, and active bowel gas pattern.  She improved on Miralax but has used it all.  If she does not improve with Miralax this time may consider CT scan.  She has not make changes in life that would benefit her health.

## 2010-07-07 NOTE — Progress Notes (Signed)
  Subjective:    Patient ID: Norma [name], female    DOB: 1961/04/09, 50 y.o.   MRN: [id number]  HPI:  Right sided pain worse at night runs around to the front.  This has been worked up in the past six months with US showing abnormal bowel gas pattern, fatty liver infiltration and normal gallbladder.  She was placed on Miralax with improvement but has been out and not taking.  She is very stressed because of the behavioral problems of her son.  She wants to make sure she does not have a kidney infection.  Agitation is improved with clonazepam    Review of Systems  Constitutional: Negative for fever, chills, activity change and appetite change.  Gastrointestinal: Positive for abdominal pain, constipation and abdominal distention. Negative for nausea, vomiting, blood in stool, anal bleeding and rectal pain.  Musculoskeletal: Positive for back pain.  Psychiatric/Behavioral: Positive for sleep disturbance and dysphoric mood. Negative for suicidal ideas. The patient is nervous/anxious.        Objective:   Physical Exam  Constitutional:       Morbid obesity mostly central  Abdominal: She exhibits no distension and no mass. There is no tenderness. There is no rebound and no guarding.          Assessment & Plan:

## 2010-07-07 NOTE — Assessment & Plan Note (Signed)
Resume PEG

## 2010-07-07 NOTE — Assessment & Plan Note (Signed)
Normal UA, still think this is bowel gas

## 2010-07-09 ENCOUNTER — Telehealth: Payer: Self-pay | Admitting: *Deleted

## 2010-07-09 NOTE — Telephone Encounter (Signed)
Called pt and informed of normal UA Arlyss Repress

## 2010-07-09 NOTE — Telephone Encounter (Signed)
Message copied by Arlyss Repress on Thu Jul 09, 2010  5:31 PM ------      Message from: Nilda Simmer      Created: Tue Jul 07, 2010  9:11 PM       Normal urine, please call patient

## 2010-07-13 ENCOUNTER — Encounter: Payer: Self-pay | Admitting: Family Medicine

## 2010-07-14 ENCOUNTER — Ambulatory Visit (HOSPITAL_COMMUNITY)
Admission: RE | Admit: 2010-07-14 | Discharge: 2010-07-14 | Disposition: A | Discharge status: Home / Self Care | Payer: Self-pay | Source: Ambulatory Visit | Attending: Family Medicine | Admitting: Family Medicine

## 2010-07-14 ENCOUNTER — Telehealth: Payer: Self-pay | Admitting: Family Medicine

## 2010-07-14 DIAGNOSIS — Z1231 Encounter for screening mammogram for malignant neoplasm of breast: Secondary | ICD-10-CM | POA: Insufficient documentation

## 2010-07-14 DIAGNOSIS — G894 Chronic pain syndrome: Secondary | ICD-10-CM

## 2010-07-14 MED ORDER — HYDROCODONE-ACETAMINOPHEN 7.5-750 MG PO TABS: 1.0000 | ORAL_TABLET | Freq: Three times a day (TID) | ORAL | Status: DC | PRN

## 2010-07-14 NOTE — Telephone Encounter (Signed)
Faxed to [facility] pharmacy [phone]

## 2010-07-14 NOTE — Telephone Encounter (Signed)
Pt asking for refill on hydrocodone °

## 2010-07-14 NOTE — Telephone Encounter (Signed)
Norma [name] calling back to ask about refill on her Hydrocodone

## 2010-07-28 ENCOUNTER — Ambulatory Visit: Payer: Self-pay | Admitting: Family Medicine

## 2010-07-30 ENCOUNTER — Ambulatory Visit: Payer: Self-pay | Admitting: Family Medicine

## 2010-08-06 ENCOUNTER — Encounter: Payer: Self-pay | Admitting: Sports Medicine

## 2010-08-06 ENCOUNTER — Ambulatory Visit (INDEPENDENT_AMBULATORY_CARE_PROVIDER_SITE_OTHER): Payer: Self-pay | Admitting: Sports Medicine

## 2010-08-06 DIAGNOSIS — G894 Chronic pain syndrome: Secondary | ICD-10-CM

## 2010-08-06 DIAGNOSIS — L600 Ingrowing nail: Secondary | ICD-10-CM

## 2010-08-06 MED ORDER — HYDROCODONE-ACETAMINOPHEN 7.5-750 MG PO TABS: 1.0000 | ORAL_TABLET | Freq: Three times a day (TID) | ORAL | Status: DC | PRN

## 2010-08-06 NOTE — Assessment & Plan Note (Signed)
Removed entire toenail. Aftercare advised. Vicodin for pain. Pt with RTC prn. May return to work.

## 2010-08-06 NOTE — Progress Notes (Signed)
  Subjective:    Patient ID: Norma [name], female    DOB: [birth year], 50 y.o.   MRN: [id number]  HPI Pt comes in for toenail removed.  She has had several months of painful right great toe, has been through a course of Lamisil.  Toenail pain is intractable.  No fevers, chills.  No current infection/eponychia/paronychia.   Review of Systems    See HPI Objective:   Physical Exam    Right great toe thickened, without erythema or purulence, no drainage, very TTP medial aspect.  Toenail yellowish, thick, curled in on itself on medial aspect.  Procedure:  Removal of right great toenail. Risks, benefits, alternatives explained to patient. Consent obtained. Time out conducted. Noted no overlying induration or erythema at site of injection. Toe cleaned with alcohol, then a total of 7cc lidocaine 2% infiltrated at adjacent webspaces at the location of the bifurcation of the common digital nerve to proper digital nerves, and dorsum of great toe.  Some lidocaine also infiltrated at hyponychium and under nail bed. Tourniquet used to keep lidocaine in toe. Adequate anesthesia ensured. Toe prepped with chlorhexidine. Nail elevator used to separate nail plate from nail bed. Hemostat then used to separate entire nail from surrounding structures and nail bed. Curettage used to remove nail fragments and debris from nail bed. Minor bleeding controlled with pressure and phenol. Toe dressed. Advised to return if increased redness, swelling, drainage, fevers, or chills.  Assessment & Plan:

## 2010-08-13 ENCOUNTER — Telehealth: Payer: Self-pay | Admitting: Family Medicine

## 2010-08-13 DIAGNOSIS — G894 Chronic pain syndrome: Secondary | ICD-10-CM

## 2010-08-13 MED ORDER — HYDROCODONE-ACETAMINOPHEN 7.5-750 MG PO TABS: 1.0000 | ORAL_TABLET | Freq: Three times a day (TID) | ORAL | Status: DC | PRN

## 2010-08-13 NOTE — Telephone Encounter (Signed)
Pt requesting refill on pain medication.

## 2010-08-13 NOTE — Telephone Encounter (Signed)
This was faxed to [facility], as she has an [facility] discount card and receives her narcotics there.

## 2010-09-04 NOTE — Op Note (Signed)
Norma [name], Norma [name]                ACCOUNT NO.:  [id number]   MEDICAL RECORD NO.:  [id number]          PATIENT TYPE:  OIB   LOCATION:  5031                         FACILITY:  [facility]   PHYSICIAN:  Mark C. Ophelia Charter, M.D.    DATE OF BIRTH:  1961-01-12   DATE OF PROCEDURE:  05/12/2005  DATE OF DISCHARGE:                                 OPERATIVE REPORT   PREOPERATIVE DIAGNOSIS:  Status post open reduction and internal fixation  fibula with syndesmotic widening.   POSTOPERATIVE DIAGNOSIS:  Status post open reduction and internal fixation  fibula with syndesmotic widening.   PROCEDURE:  Right ankle syndesmotic screw placement x 2, medial arthrotomy,  removal of unopposed deltoid ligament with bone fragment.   SURGEON:  Mark C. Ophelia Charter, M.D.   ANESTHESIA:  GOT.   TOURNIQUET TIME:  9 minutes.   BRIEF HISTORY:  50 year old female had a supination external rotation type  injury with deltoid ligament rupture and short oblique fibular fracture  starting at the mortise level and extending proximally.  Lateral plating was  performed with interfragmentary screw and syndesmosis was tested.  Fluoroscopic pictures were taken.  This was done at [facility] and spot  photos are in her chart at [facility] showing that the medial clear space  was reduced and was not widened and syndesmosis appeared stable.  The  patient returned to the office a week after surgery with increased pain, had  been walking some on her splint, and x-rays demonstrated that the  syndesmosis was widened with widening of the medial clear space.  The  patient has a small chip fracture off the medial malleolus which was  attached to the deltoid ligament.  Due to the widening, she was brought in  for a syndesmotic screw.   DESCRIPTION OF PROCEDURE:  After induction of general anesthesia and  proximal thigh tourniquet, standard prep and drape, preoperative vancomycin,  a hemostat was placed on the appropriate skin staple overlying  where the  planned syndesmotic screw was to be placed.  Spot fluoroscopy picture was  taken, hemostat was adjusted up to skin clips and once this was confirmed,  three skin staples were removed, blunt dissection down to the hole with the  bicortical identified screw removed, gold drill bit was drilled across using  the guide into the tibia and unicortical with cancellous lag screw placed.  This tightened down the syndesmosis some but checking the medial clear space  with varus and valgus of the ankle, it still appeared wide.  Medial  arthrotomy was made, deltoid ligament was inspected.  Some pieces of the  deltoid ligament were interposed in the medial clear space.  They were swept  out with a hemostat including a small flick of bone and then a second  syndesmotic screw was placed slightly distal to the first screw which was  placed posterior and outside the plate and this screw was a 45 mm screw.  Appropriate pressure was applied, clear space was checked and then the  syndesmosis was tightened down, there was good reduction.  Spot photos were  taken confirming this  after irrigation.  The medial side was checked again  to make sure nothing  was in the clear space, it was clear.  Three skin staples were placed at the  small medial arthrotomy spot, three skin staples were placed laterally, and  a postop short leg splint was applied.  The patient was transferred to the  recovery room.  Instrument count and needle counts were correct.      Mark C. Ophelia Charter, M.D.  Electronically Signed     MCY/MEDQ  D:  05/12/2005  T:  05/12/2005  Job:  [id number]

## 2010-09-04 NOTE — Op Note (Signed)
Norma [name], Norma [name]                ACCOUNT NO.:  [id number]   MEDICAL RECORD NO.:  [id number]          PATIENT TYPE:  INP   LOCATION:  1505                         FACILITY:  [facility]   PHYSICIAN:  Mark C. Ophelia Charter, M.D.    DATE OF BIRTH:  [birth year]   DATE OF PROCEDURE:  05/01/2005  DATE OF DISCHARGE:                                 OPERATIVE REPORT   PREOPERATIVE DIAGNOSIS:  Right bimalleolar ankle fracture with subluxation  and ankle dislocation.   PROCEDURE:  Open reduction/internal fixation, bimalleolar ankle fracture,  with fibular fixation.   SURGEON:  Mark C. Ophelia Charter, M.D.   ANESTHESIA:  GOT.   ESTIMATED BLOOD LOSS:  Minimal.   TOURNIQUET TIME:  Forty-seven minutes.   BRIEF HISTORY:  A 50 year old female had her dog jerk her with the leash,  causing her to fall down, suffering a bimalleolar ankle fracture/dislocation  of the right ankle with chip fracture off the medial malleolus and  subluxation of the talus and subluxation of the tibial talar joint.  She was  temporarily splinted until she could be taken to the operating room.  After  induction of general anesthesia, preoperative Ancef prophylaxis, proximal  thigh tourniquet and prepping, standard extremity sheets and drapes were  applied.  The incision was made laterally over the fibula.  Subperiosteal  dissection was performed.  A seven-hole plate was selected.  The fracture  was reduced.  There was __________and a lobster clamp was used for holding  the fracture.  Once this was applied, the plate was fashioned.  Initially,  the plate was fixed.  Screws were placed.  When it was checked, the distal  most screw was entering against the medial cortex of the distal fibula.  It  was elected to move the plate more proximally screws were removed.  The  plate was jumped up one hole.  Then screws were placed with 14 mm bicortical  screws.  Interfragmentary screw had been placed, holding the fracture.  The  medial clear space was  closed; however, with valgus, the plate would have  some stress to it without displacement of the fracture, but the medial clear  space would open, but not the syndesmosis.  After irrigation of saline  solution, 2-0 Vicryl was placed in subcutaneous tissue.  Skin staple  closure.  Marcaine infiltration.  A short leg splint molded into position  where the deltoid ligament was reapproximated, and the medial clear space  was closed.  The tourniquet was deflated prior to splint application.  The  patient tolerated the procedure well and was transferred to the recovery  room in stable condition.      Mark C. Ophelia Charter, M.D.  Electronically Signed     MCY/MEDQ  D:  05/01/2005  T:  05/03/2005  Job:  [id number]

## 2010-09-04 NOTE — H&P (Signed)
[name], Norma [name]                ACCOUNT NO.:  [id number]   MEDICAL RECORD NO.:  [id number]          PATIENT TYPE:  INP   LOCATION:  2029                         FACILITY:  [facility]   PHYSICIAN:  Norton Blizzard, M.D.    DATE OF BIRTH:  [birth year]   DATE OF ADMISSION:  01/29/2006  DATE OF DISCHARGE:  01/30/2006                                HISTORY & PHYSICAL   PRIMARY CARE PHYSICIAN:  Norma [name] at [facility].   CHIEF COMPLAINT:  Chest tightness.   HISTORY OF PRESENT ILLNESS:  Patient is a 50 year old female with a history  of anxiety, depression, and hypertension who presents with chest tightness  that has been occurring, off and on, for the past couple of days.  Patient  also complains of her heart fluttering and feeling like it is beating really  fast for about a week.  Patient states that acutely today, she had sudden  onset of chest tightness at about 2:30 p.m. that was eased only when the EMS  providers put oxygen on her when she was in the ambulance.  Patient stated  that her chest tightness has been worse with exertion, though it is not  occurring every time with exertion.  She notes that yesterday, when she was  just sitting in the car driving, she had to pull over because of the  fluttering and chest tightness came on while she was just sitting in her  car.  When we saw her in the emergency department, she denied having chest  pain currently.  She states that it has been going off and on for about 5  times per day.  She does have dyspnea with these episodes and no radiation  of the pain, but does complain of some left shoulder pain.  Patient believes  the pain to be worse when she takes a deep breath.   Patient has a history of supraventricular tachycardia and had been seen by  Dr. Algie Coffer about 3 years ago.  She was placed on Cardizem and is still  taking 240 mg b.i.d. of that medicine.   Patient states that she has had a cold since about 1  month ago, but she  denies a sour brash and denies any heartburn.   PAST MEDICAL HISTORY:  1. Hepatitis C.  2. Anxiety and depression.  3. Allergic rhinitis.  4. Osteoarthritis of the spine.  5. Hypertension.  6. Paroxysmal supraventricular tachycardia.   ALLERGIES:  No known drug allergies.   MEDICATIONS:  1. Cardizem 240 mg p.o. b.i.d.  2. Hydrochlorothiazide 25 mg daily.  3. Nexium 20 mg p.o. daily.  4. Trazodone 200 mg q.h.s. p.r.n. insomnia.  5. Vicodin 7.5/750 b.i.d. p.r.n. pain.  6. Wellbutrin XL 300 mg p.o. daily.  7. Zoloft 100 mg p.o. daily.  8. Omega 3 fatty acids 1 g daily.  9. Zyrtec 10 mg q.h.s. p.r.n. allergic rhinitis.   PAST SURGICAL HISTORY:  ORIF of the right ankle for a bimalleolar fracture  in January of this year.   SOCIAL HISTORY:  Patient denies tobacco, drug use.  She  states that she has  an occasional beer.  She lives with both her son who is 27 years old, and her  daughter who was recently found to be pregnant.  There is a history of her  husband being murdered in June of 2001.  She has another son who was  incarcerated in July of 2006.  Patient works as a Patent examiner.   FAMILY HISTORY:  Mother is still living, father is deceased when he was  somewhere between 87-13 years of age due to a motor vehicle accident.   REVIEW OF SYSTEMS:  GENERAL:  No fevers, chills, or sweats.  CARDIOVASCULAR:  Positive for orthopnea today, no paroxysmal nocturnal  dyspnea.  Others per HPI.  LUNGS:  Patient sometimes has a night time cough that is not productive,  denies wheezing.  GI:  No nausea, vomiting, constipation, diarrhea, melena, or hematochezia.  GU:  No nocturia or dysuria.  SKIN:  Denies any rashes.  MUSCULOSKELETAL:  Positive for right knee and ankle swelling.  She states  that she has had surgery on a broken ankle on that side.  The remainder of her review of systems is unremarkable.   PHYSICAL EXAMINATION:  Vital signs:  Temperature 97.1, pulse  88, blood  pressure 123/73, respirations 20, pulse ox 100% on 2L.  GENERAL:  In no acute distress, sitting in bed.  HEENT:  Atraumatic, pupils equal round, reactive to light.  Extraocular  movements intact.  Tympanic membranes clear.  No erythema or exudates.  Pharynx is without exudates or erythema as well.  CHEST:  Mild tenderness to palpation, does not reproduce the pain.  LUNGS:  Clear to auscultation bilaterally.  No wheezes, rales, or rhonchi.  No increased work of breathing.  CARDIOVASCULAR:  2/6 systolic ejection murmur, greatest at the right upper  sternal border, no rubs or gallops.  ABDOMEN:  Soft, non tender, obese.  Bowel sounds positive.  No  hepatosplenomegaly felt.  EXTREMITIES:  Warm and well perfused, 2+ DP pulses.  NEURO:  Cranial nerves 2-12 grossly intact.  She is alert and oriented x3.  Strength is 5 out of 5 in bilateral upper extremities, bilateral lower  extremities.  No decreased sensation in any extremity.  SKIN:  No rashes seen on visible skin.  ADENOPATHY:  No anterior, cervical, or supraclavicular adenopathy.   LABS AND STUDIES:  White blood cell count 6.7, hemoglobin 12.2, platelets  229, sodium 139, potassium 3.4, chloride 108, bicarbonate 24, BUN 9,  creatinine 0.9, glucose 111, calcium 8.9, protein 7.2, albumin 3.2, AST 47,  ALT 26, alk phos 62, bilirubin 0.6.  Point of care enzymes are negative x1.  A chest x-ray has no acute findings.  An EKG with this new 1st degree AV  block in the process of being repeated.   ASSESSMENT/PLAN:  A 50 year old African-American female with:  1. Chest pain.  Point of care enzymes are negative x1.  An EKG without      evidence of infarct though it does have new 1st degree AV block.  We      will rule out with cardiac markers x3 and repeat an EKG in the morning.      Dissection unlikely given this history of her chest pain not being      tearing or radiating through the back.  She also does have a wide     mediastinum  on x-ray.  Her Well's criteria is low and she is ambulatory      so  we will not check a D-dimer or scan her with a CT at this time.  She      is also satting on room air, is not tachypneic or tachycardic.      Gastroesophageal reflux disease is unlikely as she has no sour brash,      no heartburn, and there is no correlation of her disease with meals,      and as such, peptic ulcer disease is also unlikely.  Anxiety is the      most likely cause with possibly the return of her paroxysmal      supraventricular tachycardia.  It was recently discovered by her that      her 32 year old daughter is pregnant.  This was found out 1 week ago,      and her symptoms have been in the same time frame.  We will check a TSH      for her history of her supraventricular tachycardia.  2. Depression and anxiety.  Will continue Wellbutrin and Zoloft for now.      Patient states she is on both of these medicines.  3. Hepatitis C.  She is status post treatment.  No changes at this time.  4. Hypertension.  Continue Cardizem and hydrochlorothiazide.  5. History of gastroesophageal reflux disease.  Continue Protonix while      here.  6. Prophylaxis SCD's.           ______________________________  Norton Blizzard, M.D.     SH/MEDQ  D:  01/29/2006  T:  01/30/2006  Job:  [id number]

## 2010-09-04 NOTE — Discharge Summary (Signed)
NAMESHARICKA, POGORZELSKI                ACCOUNT NO.:  [id number]   MEDICAL RECORD NO.:  [id number]          PATIENT TYPE:  INP   LOCATION:  2029                         FACILITY:  [facility]   PHYSICIAN:  Melina Fiddler, MD DATE OF BIRTH:  [birth year]   DATE OF ADMISSION:  01/29/2006  DATE OF DISCHARGE:  01/30/2006                                 DISCHARGE SUMMARY   DISCHARGE DIAGNOSES:  1. Anxiety.  2. Hepatitis C.  3. Depression.  4. Hypertension.  5. History of gastroesophageal reflux disease.  6. History of paroxysmal supraventricular tachycardia.  7. Hypokalemia.   CONSULTS:  None.   PROCEDURES:  1. EKG x2.  2. Chest x-ray.   ADMIT HISTORY AND PHYSICAL:  In brief, this is a 50 year old female with a  history of hypertension, paroxysmal supraventricular tachycardia who  presented with off and on chest tightness for 2 days and off and on  fluttering in her chest for about 1 week.  These symptoms were occurring  initially with exertion, and then on the day prior to admission occurred  while she was sitting and driving in her car.  She states she has dyspnea  with these episodes.  She had been seeing Dr. Algie Coffer for this nonsustained  supraventricular tachycardia and who placed her on Cardizem, and has not  seen him in 3 years.  The patient also had the recent discovery that her  daughter was pregnant.   HOSPITAL COURSE:  1. Chest pain:  The patient was ruled out with cardiac markers x3.  EKG      showed 1st degree AV block without evidence of ischemia with that      initial EKG or the EKG done in the morning.  Chest x-ray was negative      without widened mediastinum, and the pain was not radiating, so we were      not concerned about dissection.  Risk criteria was low, so we did not      work up for PE.Marland Kitchen  She also did not have any tachypnea or tachycardia.      The patient has a history of GERD, but had no change with meals, no      breath, no heartburn, so I did not feel  this was the cause.  Felt her      chest pain was due to anxiety with this new history of her daughter      being pregnant.  She had no tele events overnight, so we are not      thinking this was SVT, though she did not complain of heart fluttering      or chest pain while she was here in the hospital.  The patient is to      follow up with Dr. Algie Coffer regarding this since she has not seen him in      3 years.  We will consider decreasing her Cardizem as an outpatient      with this 1st degree AV block.  She was started on aspirin.  2. Depression and anxiety:  Continue the patient's Wellbutrin and Zoloft.  She says she is on both of these.  3. Hepatitis C:  The patient is status post treatment.  Did have a mild      elevation in her AST.  Felt that was due to this.  4. Hypertension:  Continue with her Cardizem and hydrochlorothiazide.  5. History of gastroesophageal reflux disease:  We continued Protonix      while she was here.   DISCHARGE MEDICATIONS AND INSTRUCTIONS:  1. Aspirin 81 mg daily.  2. Hydrochlorothiazide 25 mg daily.  3. Cardizem 120 mg b.i.d.  4. Wellbutrin 300 mg daily.  5. Zoloft 100 mg daily.  6. Trazodone 200 mg nightly.   The patient is to call MD for worsening chest pain, shortness of breath, or  other concerns.   DISCHARGE CONDITION:  Good, improved.   FOLLOWUP:  1. With Dr. Corliss Marcus on January 31, 2006, as previously scheduled.  2. To follow up with Dr. Algie Coffer for possible repeat stress exam or for      evaluation of this paroxysmal supraventricular tachycardia.   ISSUES FOR FOLLOWUP:  Cardizem dose, consider decreasing from 90 b.i.d. with  this 1st degree AV block.     ______________________________  Norton Blizzard, M.D.    ______________________________  Melina Fiddler, MD    SH/MEDQ  D:  01/31/2006  T:  02/01/2006  Job:  [id number]   cc:   Ricki Rodriguez, M.D.  Renato Gails MD Okey Dupre

## 2010-09-08 ENCOUNTER — Other Ambulatory Visit: Payer: Self-pay | Admitting: Family Medicine

## 2010-09-08 DIAGNOSIS — G894 Chronic pain syndrome: Secondary | ICD-10-CM

## 2010-09-08 NOTE — Telephone Encounter (Signed)
Needs refill on Hydrocodone - please call when ready

## 2010-09-09 MED ORDER — HYDROCODONE-ACETAMINOPHEN 7.5-750 MG PO TABS: 1.0000 | ORAL_TABLET | Freq: Three times a day (TID) | ORAL | Status: DC | PRN

## 2010-09-09 NOTE — Telephone Encounter (Signed)
Script handwritten and placed up front for pick up

## 2010-09-30 ENCOUNTER — Emergency Department (HOSPITAL_COMMUNITY): Payer: No Typology Code available for payment source

## 2010-09-30 ENCOUNTER — Emergency Department (HOSPITAL_COMMUNITY)
Admission: EM | Admit: 2010-09-30 | Discharge: 2010-09-30 | Disposition: A | Discharge status: Home / Self Care | Payer: No Typology Code available for payment source | Attending: Emergency Medicine | Admitting: Emergency Medicine

## 2010-09-30 DIAGNOSIS — I10 Essential (primary) hypertension: Secondary | ICD-10-CM | POA: Insufficient documentation

## 2010-09-30 DIAGNOSIS — M25519 Pain in unspecified shoulder: Secondary | ICD-10-CM | POA: Insufficient documentation

## 2010-09-30 DIAGNOSIS — R209 Unspecified disturbances of skin sensation: Secondary | ICD-10-CM | POA: Insufficient documentation

## 2010-09-30 DIAGNOSIS — S43109A Unspecified dislocation of unspecified acromioclavicular joint, initial encounter: Secondary | ICD-10-CM | POA: Insufficient documentation

## 2010-09-30 DIAGNOSIS — S139XXA Sprain of joints and ligaments of unspecified parts of neck, initial encounter: Secondary | ICD-10-CM | POA: Insufficient documentation

## 2010-09-30 DIAGNOSIS — M542 Cervicalgia: Secondary | ICD-10-CM | POA: Insufficient documentation

## 2010-09-30 DIAGNOSIS — R079 Chest pain, unspecified: Secondary | ICD-10-CM | POA: Insufficient documentation

## 2010-10-02 ENCOUNTER — Encounter: Payer: Self-pay | Admitting: Family Medicine

## 2010-10-02 ENCOUNTER — Ambulatory Visit (INDEPENDENT_AMBULATORY_CARE_PROVIDER_SITE_OTHER): Payer: Self-pay | Admitting: Family Medicine

## 2010-10-02 VITALS — BP 128/80 | HR 73 | Wt 248.4 lb

## 2010-10-02 DIAGNOSIS — M25512 Pain in left shoulder: Secondary | ICD-10-CM

## 2010-10-02 DIAGNOSIS — M25519 Pain in unspecified shoulder: Secondary | ICD-10-CM

## 2010-10-02 DIAGNOSIS — I10 Essential (primary) hypertension: Secondary | ICD-10-CM

## 2010-10-02 DIAGNOSIS — G894 Chronic pain syndrome: Secondary | ICD-10-CM

## 2010-10-02 LAB — COMPREHENSIVE METABOLIC PANEL
ALT: 31 U/L (ref 0–35)
Albumin: 4.1 g/dL (ref 3.5–5.2)
BUN: 11 mg/dL (ref 6–23)
CO2: 26 mEq/L (ref 19–32)
Calcium: 9 mg/dL (ref 8.4–10.5)
Chloride: 103 mEq/L (ref 96–112)
Creat: 0.92 mg/dL (ref 0.50–1.10)

## 2010-10-02 MED ORDER — HYDROCODONE-ACETAMINOPHEN 7.5-750 MG PO TABS: 1.0000 | ORAL_TABLET | Freq: Three times a day (TID) | ORAL | Status: DC | PRN

## 2010-10-02 NOTE — Assessment & Plan Note (Signed)
She has AC joint separation, pain with extension, and is guarding.  She was referred to PT.  She should follow up prn.
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
## 2010-10-02 NOTE — Patient Instructions (Signed)
Physical Therapy will call you Continue your pain meds and use ibuprofen in between/ice as needed. Consider DSS to see if you now qualify for Medicaid.

## 2010-10-02 NOTE — Assessment & Plan Note (Signed)
Remains on a a pain medication contract for #90 of hydrocodone 7/5/750

## 2010-10-08 ENCOUNTER — Ambulatory Visit (INDEPENDENT_AMBULATORY_CARE_PROVIDER_SITE_OTHER): Payer: Self-pay | Admitting: Family Medicine

## 2010-10-08 ENCOUNTER — Encounter: Payer: Self-pay | Admitting: Family Medicine

## 2010-10-08 DIAGNOSIS — G44209 Tension-type headache, unspecified, not intractable: Secondary | ICD-10-CM | POA: Insufficient documentation

## 2010-10-08 DIAGNOSIS — R739 Hyperglycemia, unspecified: Secondary | ICD-10-CM

## 2010-10-08 DIAGNOSIS — R7309 Other abnormal glucose: Secondary | ICD-10-CM

## 2010-10-08 MED ORDER — CYCLOBENZAPRINE HCL 10 MG PO TABS: 10.0000 mg | ORAL_TABLET | Freq: Three times a day (TID) | ORAL | Status: DC | PRN

## 2010-10-08 MED ORDER — KETOROLAC TROMETHAMINE 60 MG/2ML IM SOLN
60.0000 mg | Freq: Once | INTRAMUSCULAR | Status: AC
  Administered 2010-10-08: 60 mg via INTRAMUSCULAR
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
## 2010-10-08 NOTE — Patient Instructions (Signed)
Muscle Cramps  Muscle cramps are due to sudden involuntary muscle contraction. This means you have no control over the tightening of a muscle (or muscles). Often there are no obvious causes. Muscle cramps may occur with over-exertion. They may also occur with chilling of the muscles. An example of a muscle chilling activity is swimming. It is uncommon for cramps to be due to a serious underlying disorder. In most cases, muscle cramps improve (or leave) within minutes.  CAUSES  Some common causes are due to:   Injury.    Infections, especially viral.    Abnormal levels of the salts and ions in your blood (electrolytes). This could happen if you are taking water pills (diuretics).    Blood vessel disease where not enough blood is getting to the muscles (intermittent claudication).   SOME UNCOMMON CAUSES OF MUSCLE CRAMPS ARE:   Side effects of some medicine (such as lithium).    Alcohol abuse.    Diseases where there is soreness (inflammation) of the muscular system.   HOME CARE INSTRUCTIONS   It may be helpful to massage, stretch, and relax the affected muscle.    Taking a dose of over-the-counter Benadryl (dephenhydramine) is helpful for night leg cramps.    Tonic water that contains quinine may be helpful.   SEEK MEDICAL CARE IF:   Cramps are frequent and not relieved with medicine.   MAKE SURE YOU:     Understand these instructions.    Will watch your condition.    Will get help right away if you are not doing well or get worse.   Document Released: 09/25/2001 Document Re-Released: 07/02/2008  [employer] Patient Information 2011 [employer].

## 2010-10-08 NOTE — Assessment & Plan Note (Signed)
Reassured patient that I see no need for imaging at this time. Likely muscle spasm left neck/upper back leading to tension-type HA. Discussed regular sleep, drinking plenty of water, light stretches (demostrated as well). Will give 4 days worth of Flexeril to help with the muscle spasm. Also, advised light massage and heat to the muscle. Patient endorsed understanding and agreed with the plan. Toradol 60 mg IM x 1 in office today to help with breaking pain.

## 2010-10-13 ENCOUNTER — Telehealth: Payer: Self-pay | Admitting: Family Medicine

## 2010-10-13 NOTE — Telephone Encounter (Signed)
Pt advised to call and sched appt.pt agreed.

## 2010-10-13 NOTE — Telephone Encounter (Signed)
Pt asking if she can get another torodol injection, migraine started back 2 days ago.

## 2010-10-14 ENCOUNTER — Ambulatory Visit (INDEPENDENT_AMBULATORY_CARE_PROVIDER_SITE_OTHER): Payer: Self-pay | Admitting: Family Medicine

## 2010-10-14 ENCOUNTER — Encounter: Payer: Self-pay | Admitting: Family Medicine

## 2010-10-14 ENCOUNTER — Ambulatory Visit: Payer: No Typology Code available for payment source | Attending: Family Medicine | Admitting: Rehabilitation

## 2010-10-14 DIAGNOSIS — IMO0001 Reserved for inherently not codable concepts without codable children: Secondary | ICD-10-CM | POA: Insufficient documentation

## 2010-10-14 DIAGNOSIS — R293 Abnormal posture: Secondary | ICD-10-CM | POA: Insufficient documentation

## 2010-10-14 DIAGNOSIS — G44209 Tension-type headache, unspecified, not intractable: Secondary | ICD-10-CM

## 2010-10-14 DIAGNOSIS — M2550 Pain in unspecified joint: Secondary | ICD-10-CM | POA: Insufficient documentation

## 2010-10-14 DIAGNOSIS — M2560 Stiffness of unspecified joint, not elsewhere classified: Secondary | ICD-10-CM | POA: Insufficient documentation

## 2010-10-14 MED ORDER — MELOXICAM 15 MG PO TABS: 15.0000 mg | ORAL_TABLET | Freq: Every day | ORAL | Status: DC

## 2010-10-14 MED ORDER — KETOROLAC TROMETHAMINE 60 MG/2ML IM SOLN
60.0000 mg | Freq: Once | INTRAMUSCULAR | Status: AC
  Administered 2010-10-14: 60 mg via INTRAMUSCULAR

## 2010-10-14 NOTE — Assessment & Plan Note (Signed)
Continued tension headache vs migraine.  No red flags.  Gave another shot of toradol today, gave rx for meloxicam.  Advised on medication rebound headache.  She will be going to physical therapy for shoulder and neck pain starting today.
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
## 2010-10-14 NOTE — Patient Instructions (Signed)
You got a strong antiinflammatory medicine today- toradol. New NSAID (anti-inflammatory)- meloxicam. Take this with food, do not take other NSAIDS- ibuprofen, aleve, with this Wean off pain medicines to prevent rebound headache.  Tension Headache (Muscle Contraction Headache) Tension headache is one of the most common causes of head pain. These headaches are usually felt as a pain over the top of your head and back of your neck. Stress, anxiety, and depression are common triggers for these headaches. Tension headaches are not life-threatening and will not lead to other types of headaches. Tension headaches can often be diagnosed by taking a history from the patient and a physical exam. Sometimes, further lab and x-ray studies are used to confirm the diagnosis. Your caregiver can advise you on how to get help solving problems that cause anxiety or stress. Antidepressants can be prescribed if depression is a problem. HOME CARE INSTRUCTIONS  If testing was done, call for your results. Remember, it is your responsibility to get the results of all testing. Do not assume everything is fine because you do not hear from your caregiver.   Only take over-the-counter or prescription medicines for pain, discomfort, or fever as directed by your caregiver.   Biofeedback, massage, or other relaxation techniques may be helpful.   Ice packs or heat to the head and neck can be used. Use these three to four times per day or as needed.   Physical therapy may be a useful addition to treatment.   If headaches continue, even with therapy, you may need to think about lifestyle changes.   Avoid excessive use of pain killers, as rebound headaches can occur.  1.  SEEK MEDICAL CARE IF:  You develop problems with medications prescribed.   You do not respond or get no relief from medications.   You have a change from the usual headache.   You develop nausea (feeling sick to your stomach) or vomiting.  SEEK  IMMEDIATE MEDICAL CARE IF:    Your headache becomes severe.   You have an unexplained oral temperature above 101.   You develop a stiff neck.   You have loss of vision.   You have muscular weakness.   You have loss of muscular control.   You develop severe symptoms different from your first symptoms.   You start losing your balance or have trouble walking.   You feel faint or pass out.  MAKE SURE YOU:    Understand these instructions.   Will watch your condition.   Will get help right away if you are not doing well or get worse.  Document Released: 04/05/2005 Document Re-Released: 07/02/2008 [facility] Patient Information 2011 [place], [place].

## 2010-10-15 ENCOUNTER — Encounter: Payer: Self-pay | Admitting: Family Medicine

## 2010-10-15 DIAGNOSIS — F339 Major depressive disorder, recurrent, unspecified: Secondary | ICD-10-CM

## 2010-10-15 NOTE — Assessment & Plan Note (Signed)
Norma [name] has requested a change in provider as she perceives that our relationship is no longer therapeutic per her 52 year old Mother.  Norma [name] became a patient assigned to me after she had a similar feeling related to another female provider who was a resident approximately 2-3 years ago.  She is chronically depressed with chronic pain and frequent visits.  She has a tendency to blame others for many of her problems and struggles with making changes within.  She is under a pain contract (3 Vicodin 7.5.750 daily), she was [facility] but has stopped.  I did notice that since she was seen on 6/15, she has been in twice for work in apt with other residents.  Since she no longer wants to see me as a provider, in her best interest I recommend that she be transferred to another provider in the practice, as she is a high utilizer of medical care and will likely continue the pattern of frequent same day appointments and frequent visits to the emergency room.
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
## 2010-10-28 ENCOUNTER — Ambulatory Visit: Payer: No Typology Code available for payment source | Attending: Family Medicine | Admitting: Rehabilitation

## 2010-10-28 DIAGNOSIS — R293 Abnormal posture: Secondary | ICD-10-CM | POA: Insufficient documentation

## 2010-10-28 DIAGNOSIS — IMO0001 Reserved for inherently not codable concepts without codable children: Secondary | ICD-10-CM | POA: Insufficient documentation

## 2010-10-28 DIAGNOSIS — M2550 Pain in unspecified joint: Secondary | ICD-10-CM | POA: Insufficient documentation

## 2010-10-28 DIAGNOSIS — M2560 Stiffness of unspecified joint, not elsewhere classified: Secondary | ICD-10-CM | POA: Insufficient documentation

## 2010-10-29 ENCOUNTER — Ambulatory Visit: Payer: No Typology Code available for payment source | Admitting: Rehabilitation

## 2010-10-30 ENCOUNTER — Encounter: Payer: Self-pay | Admitting: Family Medicine

## 2010-10-30 ENCOUNTER — Ambulatory Visit (INDEPENDENT_AMBULATORY_CARE_PROVIDER_SITE_OTHER): Payer: Self-pay | Admitting: Family Medicine

## 2010-10-30 DIAGNOSIS — M25512 Pain in left shoulder: Secondary | ICD-10-CM

## 2010-10-30 DIAGNOSIS — M25519 Pain in unspecified shoulder: Secondary | ICD-10-CM

## 2010-10-30 NOTE — Assessment & Plan Note (Signed)
Diagnosis of AC joint separation per pcp Darl Pikes saxon.  Pt unable to participate in PT to rehab shoulder due to pain.  Since mobic not helping - per pt.  Will d/c mobic.  Pt placed on Ibuprofen 600mg  po bid scheduled x 2 weeks to help with inflammation.   Pt can use vicodin( already prescribed as needed for break through pain).  We discussed the importance of PT for her recovery.  Hopefully this med change can help control pt pain and enable her to participate fully in rehab.  Pt to return for f/up with pcp.
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
## 2010-10-30 NOTE — Patient Instructions (Signed)
Stop Mobic. Take Ibuprofen 600mg  by mouth 2 x per day- scheduled for 2 weeks.  Keep doing exercises as prescribed in physical therapy.  If not improving within 2-4 weeks return back for further eval

## 2010-11-04 ENCOUNTER — Ambulatory Visit: Payer: Self-pay

## 2010-11-04 ENCOUNTER — Ambulatory Visit: Payer: No Typology Code available for payment source | Admitting: Rehabilitation

## 2010-11-05 ENCOUNTER — Ambulatory Visit: Payer: No Typology Code available for payment source | Admitting: Rehabilitation

## 2010-11-06 ENCOUNTER — Other Ambulatory Visit: Payer: Self-pay | Admitting: Family Medicine

## 2010-11-06 NOTE — Telephone Encounter (Signed)
Refill request

## 2010-11-09 ENCOUNTER — Telehealth: Payer: Self-pay | Admitting: Family Medicine

## 2010-11-09 ENCOUNTER — Other Ambulatory Visit: Payer: Self-pay | Admitting: Family Medicine

## 2010-11-09 MED ORDER — HYDROCODONE-ACETAMINOPHEN 7.5-750 MG PO TABS: 90.0000 | ORAL_TABLET | Freq: Three times a day (TID) | ORAL | Status: DC | PRN

## 2010-11-09 NOTE — Telephone Encounter (Signed)
Called Norma [name]. She will make an appointment to see me and discuss her R knee.

## 2010-11-09 NOTE — Telephone Encounter (Signed)
[name] called to say she spoke with Dr. Armen Pickup today and was told that her rx for her hydrocodone would be sent to [facility] pharmacy today.  She has been constantly calling and was told it wasn't there.  Pt want to have this filled today.

## 2010-11-09 NOTE — Telephone Encounter (Signed)
Norma [name] is calling back because of right knee pain and wants to go back to Sports Medicine so she needs a referral.  Please call her about this.

## 2010-11-09 NOTE — Telephone Encounter (Signed)
To MD

## 2010-11-09 NOTE — Telephone Encounter (Signed)
There was a refill error.  Called in to the OP pharmacy.  Patient notified.

## 2010-11-09 NOTE — Telephone Encounter (Signed)
[name] says she called and requested a refill of Vicodin to be sent to [facility] and it is still not there.  I did not see a request for that medication.  I checked to see if it was up front and it is not.  She would like a call when this has been done.

## 2010-11-13 ENCOUNTER — Ambulatory Visit (INDEPENDENT_AMBULATORY_CARE_PROVIDER_SITE_OTHER): Payer: Self-pay | Admitting: *Deleted

## 2010-11-13 ENCOUNTER — Ambulatory Visit: Payer: Self-pay

## 2010-11-13 DIAGNOSIS — IMO0001 Reserved for inherently not codable concepts without codable children: Secondary | ICD-10-CM

## 2010-11-13 DIAGNOSIS — Z111 Encounter for screening for respiratory tuberculosis: Secondary | ICD-10-CM

## 2010-11-13 NOTE — Progress Notes (Signed)
Patient had PPD placed 06/09/2010 and was read 06/12/2010. Results were negative according to letter she was given at that visit. She states she was given a letter and she took it to her employer but for some reason she was called to say her PPD ran out in July.  She feels they are mistaken. Printed her the letter again.

## 2010-11-17 ENCOUNTER — Encounter: Payer: Self-pay | Admitting: Family Medicine

## 2010-11-17 ENCOUNTER — Ambulatory Visit (INDEPENDENT_AMBULATORY_CARE_PROVIDER_SITE_OTHER): Payer: Self-pay | Admitting: Family Medicine

## 2010-11-17 VITALS — BP 110/70 | HR 80 | Temp 98.4°F | Ht 65.75 in | Wt 249.0 lb

## 2010-11-17 DIAGNOSIS — G894 Chronic pain syndrome: Secondary | ICD-10-CM

## 2010-11-17 DIAGNOSIS — M25579 Pain in unspecified ankle and joints of unspecified foot: Secondary | ICD-10-CM

## 2010-11-17 DIAGNOSIS — E669 Obesity, unspecified: Secondary | ICD-10-CM

## 2010-11-17 DIAGNOSIS — M25569 Pain in unspecified knee: Secondary | ICD-10-CM

## 2010-11-17 DIAGNOSIS — M222X9 Patellofemoral disorders, unspecified knee: Secondary | ICD-10-CM | POA: Insufficient documentation

## 2010-11-17 NOTE — Assessment & Plan Note (Signed)
Given exercises, discussed weight loss, shoe choices and due to hx of multiple joint complaints will get Vit D and ANA to check for any underlying pathology that could be contributing to it.

## 2010-11-17 NOTE — Assessment & Plan Note (Signed)
Continuation of problem, mild laxity of ATFL told to change shoes for more stability.  Some exercises for stabilization otherwise monitor. Swelling likely due to pt working on her feet all day and improves in AM no need for large work up or imaging.

## 2010-11-17 NOTE — Assessment & Plan Note (Signed)
Encouraged weight loss. Gave exercises and due to chronic joint pain told pt to consider swimming exercise as well as some weight bearing.

## 2010-11-17 NOTE — Progress Notes (Signed)
  Subjective:    Patient ID: Norma [name], female    DOB: 1961-04-14, 50 y.o.   MRN: [id number]  HPI Knee pain x 2 weeks and right ankle pain,  No trauma to knee but fell on it many years ago, pt states that sometime it does give out on her. States knee swells, working being on your feet make it worse, going up stairs, better with heat/ice, and vicodin, motrin has helped as well twice a day.  Pt has had multiple joint complaints over the coarse of time.  Right ankle swells at the end of the day and then improve in AM. Still able to walk does have some pain on the anterior upper part of ankle. Never has given out on her.  Pt denies any injury.    Review of Systems Denies fever, chills, nausea vomiting abdominal pain, dysuria, chest pain, shortness of breath dyspnea on exertion or numbness in extremities Past medical history, social, surgical and family history all reviewed.      Objective:   Physical Exam Gen: NAD, obese.  CV: RRR no murmur Pul: CTAB Right Knee: no gross deformity, ACL, PCL, LCL, MCL intact, mild + grind test,  Patella tracks to the lateral side on quad exercises. Distal pulses intact no swelling, no masses or swelling of knee.  Right ankle, mild laxity of the ATFL otherwise completely normal, pt has scar from bunionectomy on 1st toe, well heeled.        Assessment & Plan:

## 2010-11-18 ENCOUNTER — Telehealth: Payer: Self-pay | Admitting: Family Medicine

## 2010-11-18 ENCOUNTER — Ambulatory Visit: Payer: Self-pay | Attending: Family Medicine | Admitting: Physical Therapy

## 2010-11-18 DIAGNOSIS — M2550 Pain in unspecified joint: Secondary | ICD-10-CM | POA: Insufficient documentation

## 2010-11-18 DIAGNOSIS — R293 Abnormal posture: Secondary | ICD-10-CM | POA: Insufficient documentation

## 2010-11-18 DIAGNOSIS — IMO0001 Reserved for inherently not codable concepts without codable children: Secondary | ICD-10-CM | POA: Insufficient documentation

## 2010-11-18 DIAGNOSIS — M2560 Stiffness of unspecified joint, not elsewhere classified: Secondary | ICD-10-CM | POA: Insufficient documentation

## 2010-11-18 LAB — ANA: Anti Nuclear Antibody(ANA): NEGATIVE

## 2010-11-18 MED ORDER — ERGOCALCIFEROL 1.25 MG (50000 UT) PO CAPS: 50000.0000 [IU] | ORAL_CAPSULE | ORAL | Status: DC

## 2010-11-18 NOTE — Telephone Encounter (Signed)
Called pt told her that vitamin d was low. Will do weekly and told her to pick up calcium and vitamin d OTC to take daily.

## 2010-11-19 ENCOUNTER — Telehealth: Payer: Self-pay | Admitting: *Deleted

## 2010-11-19 ENCOUNTER — Ambulatory Visit: Payer: Self-pay | Admitting: Physical Therapy

## 2010-11-19 NOTE — Telephone Encounter (Signed)
Called received from Physical Therapist Karie Mainland  phone # [phone]. States patient came to her yesterday with 10/10 pain . States they are likely going to discharge her from PT and send her home with a Tens Unit .She is not meeting any goals.. Please call if you need to discuss.  Will forward to Dr. Armen Pickup.

## 2010-11-20 ENCOUNTER — Encounter: Payer: Self-pay | Admitting: Family Medicine

## 2010-11-20 ENCOUNTER — Ambulatory Visit (INDEPENDENT_AMBULATORY_CARE_PROVIDER_SITE_OTHER): Payer: Self-pay | Admitting: Family Medicine

## 2010-11-20 DIAGNOSIS — M25512 Pain in left shoulder: Secondary | ICD-10-CM

## 2010-11-20 DIAGNOSIS — M25519 Pain in unspecified shoulder: Secondary | ICD-10-CM

## 2010-11-20 DIAGNOSIS — G894 Chronic pain syndrome: Secondary | ICD-10-CM

## 2010-11-20 DIAGNOSIS — I10 Essential (primary) hypertension: Secondary | ICD-10-CM

## 2010-11-20 MED ORDER — HYDROCODONE-ACETAMINOPHEN 7.5-750 MG PO TABS: 1.0000 | ORAL_TABLET | Freq: Three times a day (TID) | ORAL | Status: DC | PRN

## 2010-11-20 NOTE — Patient Instructions (Addendum)
[name],  It was nice to meet you. Please take the vicodin up to 3 time a day. Please use the TENS machine. I will follow-up the ? Regarding physical therapy.    -Dr. Conley Canal

## 2010-11-20 NOTE — Telephone Encounter (Signed)
Will see pt in clinic today. Will address this at visit.
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
## 2010-11-24 ENCOUNTER — Ambulatory Visit: Payer: Self-pay | Admitting: Physical Therapy

## 2010-11-24 MED ORDER — HYDROCHLOROTHIAZIDE 25 MG PO TABS: 25.0000 mg | ORAL_TABLET | Freq: Every day | ORAL | Status: DC

## 2010-11-24 MED ORDER — CITALOPRAM HYDROBROMIDE 40 MG PO TABS: 40.0000 mg | ORAL_TABLET | Freq: Every day | ORAL | Status: DC

## 2010-11-24 MED ORDER — POLYETHYLENE GLYCOL 3350 17 GM/SCOOP PO POWD: 17.0000 g | Freq: Two times a day (BID) | ORAL | Status: DC

## 2010-11-24 NOTE — Assessment & Plan Note (Signed)
Remains on pain medication. Given scripts for 90 day supply of Vicodin. Pt signed new pain contract.
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
## 2010-12-02 ENCOUNTER — Ambulatory Visit: Payer: Self-pay | Admitting: Physical Therapy

## 2010-12-03 ENCOUNTER — Ambulatory Visit: Payer: Self-pay | Admitting: Physical Therapy

## 2010-12-09 ENCOUNTER — Telehealth: Payer: Self-pay | Admitting: Family Medicine

## 2010-12-09 ENCOUNTER — Ambulatory Visit: Payer: Self-pay | Admitting: Physical Therapy

## 2010-12-09 MED ORDER — HYDROCODONE-ACETAMINOPHEN 7.5-750 MG PO TABS: 1.0000 | ORAL_TABLET | Freq: Three times a day (TID) | ORAL | Status: DC | PRN

## 2010-12-09 NOTE — Telephone Encounter (Signed)
Pt was given # 30 for her hydrocodone and she takes 3 per day.  She states that she has been getting that for a long time and wants to know why it was reduced.  She is wanting the script to be changed

## 2010-12-09 NOTE — Telephone Encounter (Signed)
Norma [name] calling regarding error in quanity of tabs for her hydrocodone.  Was given 30 and should have been 90.  That's what she has been given in the past.

## 2010-12-09 NOTE — Telephone Encounter (Signed)
There was an error. She was suppose to get 90 pills for a 3 day supply.  Called pt and notified her that the scripts will be available at the front desk tomorrow.

## 2010-12-10 ENCOUNTER — Ambulatory Visit: Payer: Self-pay | Admitting: Physical Therapy

## 2010-12-14 ENCOUNTER — Ambulatory Visit: Payer: Self-pay | Admitting: Physical Therapy

## 2010-12-15 ENCOUNTER — Telehealth: Payer: Self-pay | Admitting: Family Medicine

## 2010-12-15 DIAGNOSIS — M25569 Pain in unspecified knee: Secondary | ICD-10-CM

## 2010-12-15 NOTE — Assessment & Plan Note (Signed)
A: persistent knee pain. Per pt last injection was done at sports medicine clinic over 3 months ago.  Reviewed knee x-rays from 2011. Preserved joint spaces. P: refer back to SM. May re-inject if deemed medically necessary.

## 2010-12-15 NOTE — Telephone Encounter (Signed)
Would like repeat injection in knee. She usually goes to sports medicine. I informed there that I could inject her knee. She would prefer to return to sports medicine. Will refer.

## 2010-12-15 NOTE — Telephone Encounter (Signed)
Norma [name] is requesting a referral to Sports Medicine so that she can get a shot in her knee.  Please call with any questions or when this has been done.

## 2010-12-15 NOTE — Telephone Encounter (Signed)
Do you know where she went?  Did she go to our [facility], [name]

## 2010-12-16 ENCOUNTER — Encounter: Payer: Self-pay | Admitting: Physical Therapy

## 2010-12-16 ENCOUNTER — Ambulatory Visit: Payer: Self-pay | Admitting: Physical Therapy

## 2010-12-16 NOTE — Telephone Encounter (Signed)
Norma [name]. I think she went your our [facility], but I am not 100% sure on that. Would you please call her and ask?

## 2010-12-18 NOTE — Telephone Encounter (Signed)
Pt given number to our [facility] to call and schedule appt .[place], [name]

## 2010-12-23 ENCOUNTER — Encounter: Payer: Self-pay | Admitting: Physical Therapy

## 2010-12-25 ENCOUNTER — Encounter: Payer: Self-pay | Admitting: Family Medicine

## 2010-12-25 ENCOUNTER — Ambulatory Visit (INDEPENDENT_AMBULATORY_CARE_PROVIDER_SITE_OTHER): Payer: Self-pay | Admitting: Family Medicine

## 2010-12-25 VITALS — BP 134/79 | HR 89 | Ht 66.0 in | Wt 249.0 lb

## 2010-12-25 DIAGNOSIS — N898 Other specified noninflammatory disorders of vagina: Secondary | ICD-10-CM | POA: Insufficient documentation

## 2010-12-25 DIAGNOSIS — L293 Anogenital pruritus, unspecified: Secondary | ICD-10-CM

## 2010-12-25 MED ORDER — METRONIDAZOLE 500 MG PO TABS: 500.0000 mg | ORAL_TABLET | Freq: Two times a day (BID) | ORAL | Status: AC

## 2010-12-25 NOTE — Assessment & Plan Note (Signed)
Bacterial vaginosis diagnosed on wet mount. Plan to treat with metronidazole for one week. Red flag precautions reviewed with the patient who expresses understanding. Patient will follow up with PCP in one month for routine health maintenance

## 2010-12-25 NOTE — Progress Notes (Signed)
Norma [name] presents with vaginal itching x3 day. She also notes a odor and no vaginal discharge. She denies any dysuria or fevers or chills. Her last STI test was last year. She denies any abdominal pain and feels well otherwise.  Past medical history and medications reviewed.  Exam:  BP 134/79  Pulse 89  Ht 5\' 6"  (1.676 m)  Wt 249 lb (112.946 kg)  BMI 40.19 kg/m2 Gen: Well NAD HEENT:MMM Abd: NABS, NT, ND Exts: Non edematous BL  LE, warm and well perfused.  GYN: Normal external genitalia. Thin white discharge visible. Cervix and internal vaginal canal are normal.  Wet prep: Multiple clue cells noted no yeast or trichomonas present.

## 2010-12-25 NOTE — Patient Instructions (Signed)
You have bacterial vaginosis which is a change in the normal bacteria in her vagina.  Please see the handout for more information. I'm treating you with metronidazole.  Take this twice a day for a week.  Don't drink while taking this pill. Come back in one week if you're discharge still remains.

## 2010-12-26 LAB — GC/CHLAMYDIA PROBE AMP, GENITAL
Chlamydia, DNA Probe: NEGATIVE
GC Probe Amp, Genital: NEGATIVE

## 2010-12-28 ENCOUNTER — Ambulatory Visit (INDEPENDENT_AMBULATORY_CARE_PROVIDER_SITE_OTHER): Payer: Self-pay | Admitting: Family Medicine

## 2010-12-28 ENCOUNTER — Encounter: Payer: Self-pay | Admitting: Physical Therapy

## 2010-12-28 VITALS — BP 142/97

## 2010-12-28 DIAGNOSIS — M222X9 Patellofemoral disorders, unspecified knee: Secondary | ICD-10-CM

## 2010-12-28 DIAGNOSIS — M25569 Pain in unspecified knee: Secondary | ICD-10-CM

## 2010-12-29 NOTE — Progress Notes (Signed)
  Subjective:    Patient ID: Norma [name], female    DOB: 1960/05/11, 50 y.o.   MRN: [id number]  HPI  Return of bilateral knee pain. About 5 months ago she had an injection in the right knee and that significantly improved her symptoms up until about a month ago; she's not had an injection in her left knee for greater than 6 months. With each injection she's had almost 90% improvement in her pain. Her pain is generally under the kneecap in the center of her knee, aching, sometimes sharp, worse with climbing stairs. She does have pain after sitting for long period of time and then rising. She does not feel like the knee gives way or locks.  Review of Systems She's noticed no erythema or warmth of the knee, noticed no swelling. Denies fever.    Objective:   Physical Exam  GENERAL: Well developed, well nourished, no acute distress. Obese KNEES: Bilaterally significant amount of crepitus felt and she extends the leg; bilaterally positive patellar apprehension test. Both knees are ligamentously intact to varus and valgus stress and had a normal Lachman. Distally she is neurovascularly intact in both feet with bilaterally soft calves. There is no tenderness along the lateral or medial joint line on either knee.  Review of her x-rays of both knees from October 2011: Joint space is largely preserved. There is some thickening and osteophyte formation noted on the underside of the patella (third compartment osteoarthritis)  INJECTION: Patient was given informed consent, signed copy in the chart. Appropriate time out was taken. Area prepped and draped in usual sterile fashion. One cc of kenalog plus  4 cc of lidocaine was injected into the bilateral knees using a(n) anterior approach. The patient tolerated the procedure well. There were no complications. Post procedure instructions were given.      Assessment & Plan:  Bilateral knee pain. She received great relief in the past from corticosteroid  injection. After review of her x-rays and clinical exam, I think most of her issues are with the patellofemoral or third compartment posterior throat is. She has a lot of crepitus. We discussed that at her young age I would not want to continue regular corticosteroid injections for this type of arthritis. I did agree to inject her today. We will set her up for physical therapy for patellofemoral syndrome. Particularly would like her to do some stationary biking and Lotensin. She's currently RE having physical therapy for her shoulder so we'll try to coordinate with that. She can either see me back in 6 weeks or followup when necessary.

## 2010-12-30 ENCOUNTER — Ambulatory Visit: Payer: Self-pay | Attending: Family Medicine | Admitting: Physical Therapy

## 2010-12-30 DIAGNOSIS — M2550 Pain in unspecified joint: Secondary | ICD-10-CM | POA: Insufficient documentation

## 2010-12-30 DIAGNOSIS — R293 Abnormal posture: Secondary | ICD-10-CM | POA: Insufficient documentation

## 2010-12-30 DIAGNOSIS — M2560 Stiffness of unspecified joint, not elsewhere classified: Secondary | ICD-10-CM | POA: Insufficient documentation

## 2010-12-30 DIAGNOSIS — IMO0001 Reserved for inherently not codable concepts without codable children: Secondary | ICD-10-CM | POA: Insufficient documentation

## 2010-12-31 ENCOUNTER — Encounter: Payer: Self-pay | Admitting: Physical Therapy

## 2011-01-02 ENCOUNTER — Emergency Department (HOSPITAL_COMMUNITY)
Admission: EM | Admit: 2011-01-02 | Discharge: 2011-01-02 | Disposition: A | Discharge status: Home / Self Care | Payer: Self-pay | Attending: Emergency Medicine | Admitting: Emergency Medicine

## 2011-01-02 DIAGNOSIS — Z79899 Other long term (current) drug therapy: Secondary | ICD-10-CM | POA: Insufficient documentation

## 2011-01-02 DIAGNOSIS — R51 Headache: Secondary | ICD-10-CM | POA: Insufficient documentation

## 2011-01-02 DIAGNOSIS — R079 Chest pain, unspecified: Secondary | ICD-10-CM | POA: Insufficient documentation

## 2011-01-02 DIAGNOSIS — M549 Dorsalgia, unspecified: Secondary | ICD-10-CM | POA: Insufficient documentation

## 2011-01-02 DIAGNOSIS — I10 Essential (primary) hypertension: Secondary | ICD-10-CM | POA: Insufficient documentation

## 2011-01-13 ENCOUNTER — Ambulatory Visit: Payer: No Typology Code available for payment source | Admitting: Physical Therapy

## 2011-01-22 ENCOUNTER — Ambulatory Visit (INDEPENDENT_AMBULATORY_CARE_PROVIDER_SITE_OTHER): Payer: Self-pay

## 2011-01-22 DIAGNOSIS — Z23 Encounter for immunization: Secondary | ICD-10-CM

## 2011-01-27 ENCOUNTER — Ambulatory Visit: Payer: No Typology Code available for payment source | Attending: Family Medicine | Admitting: Physical Therapy

## 2011-01-27 DIAGNOSIS — IMO0001 Reserved for inherently not codable concepts without codable children: Secondary | ICD-10-CM | POA: Insufficient documentation

## 2011-01-27 DIAGNOSIS — R293 Abnormal posture: Secondary | ICD-10-CM | POA: Insufficient documentation

## 2011-01-27 DIAGNOSIS — M2550 Pain in unspecified joint: Secondary | ICD-10-CM | POA: Insufficient documentation

## 2011-01-27 DIAGNOSIS — M2560 Stiffness of unspecified joint, not elsewhere classified: Secondary | ICD-10-CM | POA: Insufficient documentation

## 2011-01-29 ENCOUNTER — Other Ambulatory Visit: Payer: Self-pay | Admitting: Family Medicine

## 2011-01-29 NOTE — Telephone Encounter (Signed)
Norma [name] is calling a little early so that Dr. Armen Pickup will have plenty of time to get the Rx for Vicodin ready.  She would like a call when it is ready.

## 2011-02-01 ENCOUNTER — Telehealth: Payer: Self-pay | Admitting: Family Medicine

## 2011-02-01 NOTE — Telephone Encounter (Signed)
Needs a refill on Flagyl.  She is sure she didn't take the medication right, but she is now having the same symptoms.

## 2011-02-02 NOTE — Telephone Encounter (Signed)
Called pt at home. She is to get an appointment to re-evaluate her vaginal discharge.

## 2011-02-02 NOTE — Telephone Encounter (Signed)
Called pt at home. Scripts will be available up front for pick up by Friday.

## 2011-02-03 ENCOUNTER — Ambulatory Visit: Payer: No Typology Code available for payment source | Admitting: Rehabilitation

## 2011-02-04 MED ORDER — HYDROCODONE-ACETAMINOPHEN 7.5-750 MG PO TABS: 1.0000 | ORAL_TABLET | Freq: Three times a day (TID) | ORAL | Status: DC | PRN

## 2011-02-09 ENCOUNTER — Encounter: Payer: No Typology Code available for payment source | Admitting: Rehabilitation

## 2011-02-10 ENCOUNTER — Ambulatory Visit: Payer: No Typology Code available for payment source | Admitting: Rehabilitation

## 2011-02-11 ENCOUNTER — Encounter: Payer: Self-pay | Admitting: Family Medicine

## 2011-02-11 ENCOUNTER — Ambulatory Visit (INDEPENDENT_AMBULATORY_CARE_PROVIDER_SITE_OTHER): Payer: Self-pay | Admitting: Family Medicine

## 2011-02-11 VITALS — BP 157/84 | HR 66 | Temp 97.9°F | Ht 66.0 in | Wt 250.0 lb

## 2011-02-11 DIAGNOSIS — N760 Acute vaginitis: Secondary | ICD-10-CM

## 2011-02-11 DIAGNOSIS — N898 Other specified noninflammatory disorders of vagina: Secondary | ICD-10-CM

## 2011-02-11 LAB — POCT WET PREP (WET MOUNT)
Trichomonas Wet Prep HPF POC: NEGATIVE
WBC, Wet Prep HPF POC: <5

## 2011-02-11 NOTE — Progress Notes (Signed)
  Subjective:    Patient ID: Norma [name], female    DOB: [birth year], 50 y.o.   MRN: [id number]  HPI Here for several weeks on continued vaginal discharge.  Smelly, she feels it is consistent with BV.  States she was diagnosed with BV one month prior and was prescribed metronidazole.  States she only took one pill per day due to GI upset  Has not had intercourse since last STD check.  Also feels vaginal is irritated and dry.   Review of Systems See HPI    Objective:   Physical Exam  GEN: NAD Pelvic Exam:        External: normal female genitalia without lesions or masses        Vagina: normal without lesions or masses.  appropriately rugated, not dry.        Cervix: normal without lesions or masses        Adnexa: normal bimanual exam without masses or fullness        Uterus: normal by palpation        Samples for Wet prep       Assessment & Plan:

## 2011-02-11 NOTE — Patient Instructions (Signed)
I will call you if there is infection that needs to be treated area otherwise I will send you a letter that everything is normal  Do not douche  Please see instructions on care for bacterial vaginosis  If you continue to have itching please followup with your primary care.  It appears you may be due for an annual physical

## 2011-02-12 ENCOUNTER — Encounter: Payer: Self-pay | Admitting: Family Medicine

## 2011-02-12 DIAGNOSIS — N760 Acute vaginitis: Secondary | ICD-10-CM | POA: Insufficient documentation

## 2011-02-12 NOTE — Assessment & Plan Note (Addendum)
No evidence of BV or yeast today.  No sexual intercourse since last negative GC/Chlamydia.   Possible atrophic vaginitis, would make sense given her years since menopause, but does not appear particularly atrophic today.  Advised vaginal lubricant, overdue for annual gynecological exam.  Advised if continued to have discomfort, to discuss at her gynecological exam a trial of topical estrogen.

## 2011-02-16 ENCOUNTER — Encounter: Payer: No Typology Code available for payment source | Admitting: Physical Therapy

## 2011-02-16 ENCOUNTER — Ambulatory Visit (INDEPENDENT_AMBULATORY_CARE_PROVIDER_SITE_OTHER): Payer: Self-pay | Admitting: Family Medicine

## 2011-02-16 DIAGNOSIS — M79609 Pain in unspecified limb: Secondary | ICD-10-CM

## 2011-02-16 DIAGNOSIS — M79672 Pain in left foot: Secondary | ICD-10-CM

## 2011-02-16 NOTE — Assessment & Plan Note (Signed)
Most likely diagnosis is a ganglion cyst which she irritated on trauma. It is not suggestive of a hematoma or infection. It does not bother her and I would recommend no further intervention at this time

## 2011-02-16 NOTE — Patient Instructions (Signed)
Return if you notice redness, swelling or pain

## 2011-02-16 NOTE — Progress Notes (Signed)
  Subjective:    Patient ID: Norma [name], female    DOB: September 22, 1960, 50 y.o.   MRN: [id number]  HPI Patient returns for a two-week history of not on her left foot.  She reports that 2 weeks ago she kicked the edge of the table and has had some mild pain on the lateral aspect of her left foot. In the past week she notes that there has been a "knot" on her foot that has gotten larger. It is mildly painful to palpation but otherwise doesn't bother her   Review of Systems she denies any fevers chills redness or difficulty walking     Objective:   Physical Exam GEN: NAD Left foot: Soft mobile mass along the lateral aspect on the dorsum of her foot consistent with a ganglion cyst. No evidence of infection. Minimally painful to palpation.  Approximately one centimeter in diameter       Assessment & Plan:

## 2011-02-18 ENCOUNTER — Other Ambulatory Visit: Payer: Self-pay | Admitting: Family Medicine

## 2011-02-18 ENCOUNTER — Ambulatory Visit: Payer: Self-pay | Attending: Family Medicine | Admitting: Rehabilitation

## 2011-02-18 DIAGNOSIS — M2560 Stiffness of unspecified joint, not elsewhere classified: Secondary | ICD-10-CM | POA: Insufficient documentation

## 2011-02-18 DIAGNOSIS — IMO0001 Reserved for inherently not codable concepts without codable children: Secondary | ICD-10-CM | POA: Insufficient documentation

## 2011-02-18 DIAGNOSIS — M2550 Pain in unspecified joint: Secondary | ICD-10-CM | POA: Insufficient documentation

## 2011-02-18 DIAGNOSIS — R293 Abnormal posture: Secondary | ICD-10-CM | POA: Insufficient documentation

## 2011-02-19 NOTE — Telephone Encounter (Signed)
Refill request

## 2011-02-23 ENCOUNTER — Ambulatory Visit: Payer: Self-pay | Admitting: Rehabilitation

## 2011-02-26 ENCOUNTER — Encounter: Payer: Self-pay | Admitting: Family Medicine

## 2011-02-26 ENCOUNTER — Ambulatory Visit (INDEPENDENT_AMBULATORY_CARE_PROVIDER_SITE_OTHER): Payer: Self-pay | Admitting: Family Medicine

## 2011-02-26 DIAGNOSIS — K1379 Other lesions of oral mucosa: Secondary | ICD-10-CM | POA: Insufficient documentation

## 2011-02-26 DIAGNOSIS — R209 Unspecified disturbances of skin sensation: Secondary | ICD-10-CM

## 2011-02-26 DIAGNOSIS — R200 Anesthesia of skin: Secondary | ICD-10-CM

## 2011-02-26 MED ORDER — LORATADINE 10 MG PO TABS: 10.0000 mg | ORAL_TABLET | Freq: Every day | ORAL | Status: DC

## 2011-02-26 NOTE — Progress Notes (Signed)
  Subjective:    Patient ID: Norma [name], female    DOB: 1960-11-15, 50 y.o.   MRN: [id number]  HPI  The patient presents as a work in for tongue numbness and burning sensation. This has been going on for 3 weeks. Patient remembers burning her tongue on hot coffee about 3 weeks ago, and is concerned because numbness should have resolved by now. She also complains of tongue swelling, dry and cracked lips. She denies any painful sores or lesions inside or around her mouth. She denies eating any foods that she may be allergic to or taking any new medications. She endorses sore throat that is aggravated when swallowing solid foods, but denies any difficulty breathing or shortness of breath. Patient asking for Tylenol with codeine to treat sore throat.  Review of Systems  Patient denies any runny nose, cough, or congestion, fevers, chills, night sweats.    Objective:   Physical Exam  Constitutional: She is oriented to person, place, and time. No distress.  HENT:  Head: Normocephalic.       Patient was unable to open mouth large enough for me to visualize pharynx for any exudates or erythema; gums and buccal areas did not show any sores or lesions; lips are dry, but no swelling, redness, or sores  Neck: Normal range of motion. Neck supple.  Cardiovascular: Normal rate, regular rhythm and normal heart sounds.  Exam reveals no gallop and no friction rub.   No murmur heard. Pulmonary/Chest: Effort normal and breath sounds normal. She has no wheezes. She has no rales.  Lymphadenopathy:    She has no cervical adenopathy.  Neurological: She is alert and oriented to person, place, and time. No cranial nerve deficit.       No focal neurologic deficits.  Sensation and motor intact.  Skin: Skin is dry. No rash noted. No erythema.           Assessment & Plan:

## 2011-02-26 NOTE — Patient Instructions (Signed)
Please continue to monitor your lips/tongue for numbness. Keep a diary everyday for the next 2 weeks and record if numbness is getting better or worse. Take Claritin (antihistamine) daily to reduce swelling. Continue to take home dose of Vicodin to suppress cough. Follow up with your PCP in 2 weeks if symptoms worsen. Thank you.

## 2011-02-26 NOTE — Assessment & Plan Note (Addendum)
Numbness around the lips and tip of the tongue likely secondary to burn from drinking hot coffee. No focal neuro deficits on exam, so numbness less likely cause by TIA. Patient's medications were reviewed with Dr. Deirdre Priest - no medications that could cause angioedema were noted. If there is some component of allergic reaction, will give a prescription for Claritin 10 mg daily to reduce swelling. Advised patient to keep a diary to keep track of tongue and lip numbness. For sore throat, told patient that I cannot prescribe Tylenol with codeine because she is already taking chronic narcotics. Patient to followup with primary care provider if symptoms do not improve in 1-2 weeks. Patient understood and agreed with plan.

## 2011-03-02 ENCOUNTER — Encounter: Payer: Self-pay | Admitting: Physical Therapy

## 2011-03-05 ENCOUNTER — Encounter: Payer: Self-pay | Admitting: Rehabilitation

## 2011-03-08 ENCOUNTER — Other Ambulatory Visit (HOSPITAL_COMMUNITY)
Admission: RE | Admit: 2011-03-08 | Discharge: 2011-03-08 | Disposition: A | Discharge status: Home / Self Care | Payer: Self-pay | Source: Ambulatory Visit | Attending: Family Medicine | Admitting: Family Medicine

## 2011-03-08 ENCOUNTER — Ambulatory Visit (INDEPENDENT_AMBULATORY_CARE_PROVIDER_SITE_OTHER): Payer: Self-pay | Admitting: Family Medicine

## 2011-03-08 ENCOUNTER — Encounter: Payer: Self-pay | Admitting: Family Medicine

## 2011-03-08 DIAGNOSIS — K1370 Unspecified lesions of oral mucosa: Secondary | ICD-10-CM

## 2011-03-08 DIAGNOSIS — Z01419 Encounter for gynecological examination (general) (routine) without abnormal findings: Secondary | ICD-10-CM | POA: Insufficient documentation

## 2011-03-08 DIAGNOSIS — I10 Essential (primary) hypertension: Secondary | ICD-10-CM

## 2011-03-08 DIAGNOSIS — Z124 Encounter for screening for malignant neoplasm of cervix: Secondary | ICD-10-CM

## 2011-03-08 DIAGNOSIS — K1379 Other lesions of oral mucosa: Secondary | ICD-10-CM

## 2011-03-08 MED ORDER — HYDROCHLOROTHIAZIDE 25 MG PO TABS: 50.0000 mg | ORAL_TABLET | Freq: Every day | ORAL | Status: DC

## 2011-03-08 NOTE — Assessment & Plan Note (Signed)
A: elevated. Meds: compliant P: increase HCTZ to 50 mg PO q AM.

## 2011-03-08 NOTE — Patient Instructions (Signed)
Ms. Nagorski  thank you for coming in today.  For your mouth pain: Please use Orajel to numb the pain. He may also switch to a sensitive Tuesday for next 1-2 weeks. May continue to rinse her mouth with warm salt water. For your blood pressure: Increase HCTZ to 50 mg daily. 2 tabs. Congratulations on your weight loss. Please continue to exercise and maintain a low-salt calorie-controlled diet. I will call you with results of your Pap smear.  Have a Happy Thanksgiving.  Dr. Armen Pickup

## 2011-03-08 NOTE — Assessment & Plan Note (Signed)
A: pain x 1 month 2/2 to burn for hot beverage.  P: continue conservative management, orajel, avoid acidic foods. See pt instructions.

## 2011-03-08 NOTE — Progress Notes (Signed)
  Subjective:    Patient ID: Norma [name], female    DOB: February 17, 1961, 50 y.o.   MRN: [id number]  HPI Norma [name] is a 50 year old female who comes in for Pap smear as well as to discuss mouth pain.  1. mouth pain: Present for the past month. Patient reports drinking hot coffee and having pain immediately. 3 days later she developed some swelling and numbness inside of her lips. She now has persistent mouth pain mostly in her tongue and inside of her lips. She denies ulceration now or previously. She does not smoke cigarettes chew tobacco or drink alcohol heavily. She denies fevers shaking chills. For pain relief she has gargle warm saltwater and has been careful to chew soft foods.  2. Hypertension: The patient is taking her blood pressure medicine as prescribed. She did not take her medicine this morning. Her blood pressure has been elevated the last few visits. She is compliant with low low-salt diet. She is exercising and consuming a low calorie diet. She has lost 5 pounds in the last month. She denies chest pain, shortness of breath lower extremity,edema. She admits to fatigue.  3. well woman exam: Last Pap February 2011 was normal. Recent GC/Chlamydia negative. Recent workup negative. Patient had a normal mammogram this past year.    Review of Systems Pertinent review of systems as per history of present illness    Objective:   Physical Exam General: well developed no acute distress. HEENT: Moist mucous membranes patella onto the tongue are enlarged and red. There are no buccal, gingival or tongue ulcerations. Dentition is poor and patient has a few missing teeth and dental caries. Cardiovascular: S1-S2 regular rate and rhythm Respiratory : Normal work of breathing clear to auscultation bilaterally  GU: The wall will or vaginal lesions. No vaginal discharge. Pap smear done. No cervical motion tenderness. Difficult to palpate uterus due to abdominal obesity. Extremity: No edema          Assessment & Plan:

## 2011-03-09 ENCOUNTER — Telehealth: Payer: Self-pay | Admitting: Family Medicine

## 2011-03-09 ENCOUNTER — Encounter: Payer: Self-pay | Admitting: Physical Therapy

## 2011-03-09 NOTE — Telephone Encounter (Signed)
Normal results (pap). Next well woman exam in 1 year.

## 2011-03-15 ENCOUNTER — Telehealth: Payer: Self-pay | Admitting: Family Medicine

## 2011-03-15 NOTE — Telephone Encounter (Signed)
Pt says she was given her hydrocodone for 3 months and has mis placed one of them, wants to know what she should do?

## 2011-03-16 NOTE — Telephone Encounter (Signed)
Called patient back. I asked patient to check which script she misplaced and call in with the information. Patient will call back tomorrow AM with the info. I will replace script once I know which one is missing.

## 2011-04-03 ENCOUNTER — Encounter (HOSPITAL_COMMUNITY): Payer: Self-pay | Admitting: *Deleted

## 2011-04-03 ENCOUNTER — Emergency Department (HOSPITAL_COMMUNITY)
Admission: EM | Admit: 2011-04-03 | Discharge: 2011-04-03 | Disposition: A | Discharge status: Home / Self Care | Payer: Self-pay | Attending: Emergency Medicine | Admitting: Emergency Medicine

## 2011-04-03 DIAGNOSIS — R000 Tachycardia, unspecified: Secondary | ICD-10-CM | POA: Insufficient documentation

## 2011-04-03 DIAGNOSIS — J069 Acute upper respiratory infection, unspecified: Secondary | ICD-10-CM | POA: Insufficient documentation

## 2011-04-03 DIAGNOSIS — IMO0001 Reserved for inherently not codable concepts without codable children: Secondary | ICD-10-CM | POA: Insufficient documentation

## 2011-04-03 HISTORY — DX: Tachycardia, unspecified: R00.0

## 2011-04-03 HISTORY — DX: Gastro-esophageal reflux disease without esophagitis: K21.9

## 2011-04-03 HISTORY — DX: Essential (primary) hypertension: I10

## 2011-04-03 MED ORDER — OXYCODONE-ACETAMINOPHEN 5-325 MG PO TABS: 1.0000 | ORAL_TABLET | ORAL | Status: AC | PRN

## 2011-04-03 MED ORDER — HYDROCOD POLST-CHLORPHEN POLST 10-8 MG/5ML PO LQCR
5.0000 mL | Freq: Once | ORAL | Status: AC
  Administered 2011-04-03: 5 mL via ORAL
  Filled 2011-04-03: qty 5

## 2011-04-03 NOTE — ED Notes (Signed)
Some redness noted to pt's oropharynx, but no edema noted.

## 2011-04-03 NOTE — ED Notes (Addendum)
Pt st's she starting feeling "very bad" last night.  St's she is feeling very weak, st's she has some SOB, denies abdominal pain, denies n/v/d.  Also complains of a h/a, some chest pain related to coughing.  Breath sounds clear and equal bilaterally.

## 2011-04-03 NOTE — ED Provider Notes (Signed)
History     CSN: [id number] Arrival date & time: 04/03/2011 11:38 AM   First MD Initiated Contact with Patient 04/03/11 1224      Chief Complaint  Patient presents with  . Sore Throat  . Fever    (Consider location/radiation/quality/duration/timing/severity/associated sxs/prior treatment) Patient is a 50 y.o. female presenting with pharyngitis and fever. The history is provided by the patient.  Sore Throat This is a new problem. The current episode started in the past 7 days. The problem occurs constantly. The problem has been unchanged. Associated symptoms include congestion, coughing, a fever, myalgias and a sore throat. Pertinent negatives include no abdominal pain, chills or nausea. The symptoms are aggravated by nothing.  Fever Primary symptoms of the febrile illness include fever, cough and myalgias. Primary symptoms do not include abdominal pain or nausea.    Past Medical History  Diagnosis Date  . Hypertension   . Acid reflux   . Tachyarrhythmia     History reviewed. No pertinent past surgical history.  No family history on file.  History  Substance Use Topics  . Smoking status: Former Games developer  . Smokeless tobacco: Never Used  . Alcohol Use: Yes     occasionally    OB History    Grav Para Term Preterm Abortions TAB SAB Ect Mult Living                  Review of Systems  Constitutional: Positive for fever. Negative for chills.  HENT: Positive for congestion, sore throat and postnasal drip.   Respiratory: Positive for cough.   Cardiovascular: Negative.   Gastrointestinal: Negative.  Negative for nausea and abdominal pain.  Musculoskeletal: Positive for myalgias.  Skin: Negative.   Neurological: Negative.     Allergies  Nortriptyline and Prednisone  Home Medications   Current Outpatient Rx  Name Route Sig Dispense Refill  . CITALOPRAM HYDROBROMIDE 40 MG PO TABS Oral Take 1 tablet (40 mg total) by mouth daily. (mental health) 90 tablet 2  .  CLONAZEPAM 0.5 MG PO TABS Oral Take 1 tablet (0.5 mg total) by mouth 2 (two) times daily. 60 tablet 0  . DILTIAZEM HCL ER COATED BEADS 180 MG PO CP24 Oral Take 180 mg by mouth daily.     Marland Kitchen ESOMEPRAZOLE MAGNESIUM 40 MG PO CPDR Oral Take 40 mg by mouth daily.     Marland Kitchen HYDROCHLOROTHIAZIDE 25 MG PO TABS Oral Take 2 tablets (50 mg total) by mouth daily. 90 tablet 3  . HYDROCODONE-ACETAMINOPHEN 7.5-750 MG PO TABS Oral Take 1 tablet by mouth every 8 (eight) hours as needed. 90 tablet 0  . POLYETHYLENE GLYCOL 3350 PO POWD Oral Take 17 g by mouth 2 (two) times daily. 17 GM in 4 oz water two times a day for 3 days then daily, QS for one month 255 g 3  . TRAZODONE HCL 100 MG PO TABS Oral Take 100 mg by mouth at bedtime.     Marland Kitchen FLUTICASONE PROPIONATE 50 MCG/ACT NA SUSP Nasal 2 sprays by Nasal route daily.     Marland Kitchen LORATADINE 10 MG PO TABS Oral Take 1 tablet (10 mg total) by mouth daily. 90 tablet 0    BP 121/82  Pulse 103  Temp(Src) 100 F (37.8 C) (Oral)  Resp 16  SpO2 95%  Physical Exam  Constitutional: She appears well-developed and well-nourished.  HENT:  Head: Normocephalic.  Nose: Mucosal edema present.  Mouth/Throat: Mucous membranes are normal. No posterior oropharyngeal edema or posterior oropharyngeal erythema.  Neck: Normal range of motion. Neck supple.  Cardiovascular: Regular rhythm.  Tachycardia present.   Pulmonary/Chest: Effort normal and breath sounds normal. She has no wheezes. She has no rales. She exhibits no tenderness.  Abdominal: Soft. Bowel sounds are normal. There is no tenderness. There is no rebound and no guarding.  Musculoskeletal: Normal range of motion.  Neurological: She is alert. No cranial nerve deficit.  Skin: Skin is warm and dry. No rash noted.  Psychiatric: She has a normal mood and affect.    ED Course  Procedures (including critical care time)  Labs Reviewed - No data to display No results found.   No diagnosis found.    MDM  Felt exam supports a  viral process, URI vs. Influenza. Patient given option of IV fluids to help her feel better, but she declines in favor of being discharged home.         Rodena Medin, PA 04/03/11 1304

## 2011-04-03 NOTE — ED Notes (Signed)
Pt complaining of sore throat, headache, body ache, chills for past 24 hours. Pt states she took 2 aleve 30 mins PTA

## 2011-04-03 NOTE — ED Provider Notes (Signed)
Medical screening examination/treatment/procedure(s) were performed by non-physician practitioner and as supervising physician I was immediately available for consultation/collaboration.  Herny Scurlock R. Xeng Kucher, MD 04/03/11 1605 

## 2011-04-03 NOTE — ED Notes (Signed)
Gave pt a ginger ale with ice, with permission from RN, Mika.

## 2011-04-03 NOTE — ED Notes (Signed)
Pt states she began having headache and generalized aches and body pains yesterday.  Pt also endorses subjective fever at home.  Pt denies N/V/D.  Pt denies cough.

## 2011-04-07 NOTE — Telephone Encounter (Signed)
I called pt at home. Patient breached contract in two ways.  1. Lost script: will not be replaced. 2. Received percocet from ED on 04/03/11 fore sore throat and failed to call in and let me know.   This is a breach of contract, I will not replace lost script. I will continue to prescribe but pt will have to come in for an office visit next month for get script.

## 2011-04-07 NOTE — Telephone Encounter (Signed)
Pt calling back, says she misplaced the Dec Rx, would like new one written, call when ready.

## 2011-04-15 ENCOUNTER — Encounter: Payer: Self-pay | Admitting: Family Medicine

## 2011-04-15 ENCOUNTER — Ambulatory Visit (INDEPENDENT_AMBULATORY_CARE_PROVIDER_SITE_OTHER): Payer: Self-pay | Admitting: Family Medicine

## 2011-04-15 VITALS — BP 130/78 | Temp 98.0°F | Ht 66.0 in | Wt 235.9 lb

## 2011-04-15 DIAGNOSIS — R05 Cough: Secondary | ICD-10-CM

## 2011-04-15 DIAGNOSIS — R058 Other specified cough: Secondary | ICD-10-CM | POA: Insufficient documentation

## 2011-04-15 DIAGNOSIS — R059 Cough, unspecified: Secondary | ICD-10-CM

## 2011-04-15 MED ORDER — HYDROCODONE-HOMATROPINE 5-1.5 MG/5ML PO SYRP: 5.0000 mL | ORAL_SOLUTION | Freq: Every evening | ORAL | Status: AC | PRN

## 2011-04-15 NOTE — Progress Notes (Signed)
  Subjective:    Patient ID: KEIAIRA DONLAN, female    DOB: 10-13-1960, 50 y.o.   MRN: [id number]  HPI uri x 2 weeks, now resolving  Went to ER 12/15, was discharged home with dx viral illness.  States fever, dyspnea have resolved.  Now bothered by residual cough and fatigue.  Coughing keeping her up at night.  No dyspnea, fever. Is on chronic narcotics, but lost her last prescription so it currently out.  Review of Systemssee HPI     Objective:   Physical Exam GEN: Alert & Oriented, No acute distress HEENT: [place]/AT. EOMI, PERRLA, no conjunctival injection or scleral icterus.  Bilateral tympanic membranes intact without erythema or effusion.  .  Nares without edema or rhinorrhea.  Oropharynx is with mild erythema, no exudates.  No anterior or posterior cervical lymphadenopathy. CV:  Regular Rate & Rhythm, no murmur Respiratory:  Normal work of breathing, CTAB Abd:  + BS, soft, no tenderness to palpation         Assessment & Plan:

## 2011-04-15 NOTE — Assessment & Plan Note (Signed)
Agreed to limited course of hycodan cough syrup.  Discussed supportive care and usual course and resolution of post viral cough.

## 2011-04-15 NOTE — Patient Instructions (Signed)
Cough, Adult  A cough is a reflex. It helps you clear your throat and airways. A cough can help heal your body. A cough can last 2 or 3 weeks (acute) or may last more than 8 weeks (chronic). Some common causes of a cough can include an infection, allergy, or a cold. HOME CARE  Only take medicine as told by your doctor.     If given, take your medicines (antibiotics) as told. Finish them even if you start to feel better.     Use a cold steam vaporizer or humidier in your home. This can help loosen thick spit (secretions).     Sleep so you are almost sitting up (semi-upright). Use pillows to do this. This helps reduce coughing.     Rest as needed.     Stop smoking if you smoke.  GET HELP RIGHT AWAY IF:  You have yellowish-white fluid (pus) in your thick spit.     Your cough gets worse.     Your medicine does not reduce coughing, and you are losing sleep.     You cough up blood.     You have trouble breathing.     Your pain gets worse and medicine does not help.     You have a fever.  MAKE SURE YOU:    Understand these instructions.     Will watch your condition.     Will get help right away if you are not doing well or get worse.  Document Released: 12/17/2010 Document Reviewed: 10/12/2010 [facility] Patient Information 2012 [place], [place].

## 2011-04-22 ENCOUNTER — Other Ambulatory Visit: Payer: Self-pay | Admitting: Family Medicine

## 2011-04-22 MED ORDER — KETOPROFEN POWD: Status: DC

## 2011-04-26 ENCOUNTER — Telehealth: Payer: Self-pay | Admitting: Family Medicine

## 2011-04-26 ENCOUNTER — Ambulatory Visit (INDEPENDENT_AMBULATORY_CARE_PROVIDER_SITE_OTHER): Payer: Self-pay | Admitting: Family Medicine

## 2011-04-26 DIAGNOSIS — R058 Other specified cough: Secondary | ICD-10-CM

## 2011-04-26 DIAGNOSIS — R05 Cough: Secondary | ICD-10-CM

## 2011-04-26 DIAGNOSIS — R059 Cough, unspecified: Secondary | ICD-10-CM

## 2011-04-26 MED ORDER — LORATADINE 10 MG PO TABS: 10.0000 mg | ORAL_TABLET | Freq: Every day | ORAL | Status: DC

## 2011-04-26 MED ORDER — TRIAMCINOLONE ACETONIDE 0.1 % EX CREA: TOPICAL_CREAM | Freq: Two times a day (BID) | CUTANEOUS | Status: DC

## 2011-04-26 MED ORDER — OMEPRAZOLE 20 MG PO CPDR: 20.0000 mg | DELAYED_RELEASE_CAPSULE | Freq: Every day | ORAL | Status: DC

## 2011-04-26 NOTE — Progress Notes (Signed)
  Subjective:    Patient ID: Norma [name], female    DOB: 1961-03-27, 51 y.o.   MRN: [id number]  HPI Here for follow-up of cough  3 weeks of cough.  Cough syrup did not help.  No dyspnea, sputum, fever.  Overall improving.  Cough worst at night and early morning.  + PND   Review of Systemssee above     Objective:   Physical Exam GEN: Alert & Oriented, No acute distress CV:  Regular Rate & Rhythm, no murmur Respiratory:  Normal work of breathing, CTAB         Assessment & Plan:

## 2011-04-26 NOTE — Patient Instructions (Signed)
  Your blood pressure is high- please make follow-up with your regular doctor  A cough can last for 6-8 weeks.  If you  Notice new fever, shortness of breath or other concerns, pelase come back sooner  See info on nasal saline irrigation  I have refilled your claritin, omeprazole and steroid cream

## 2011-04-26 NOTE — Telephone Encounter (Signed)
States she continues with dry cough, ribs  and chest are sore due to cough.  Advised she will need appointment for further cough  med. Dr. Earnest Bailey has available appointment this afternoon. Appointment scheduled.

## 2011-04-26 NOTE — Assessment & Plan Note (Signed)
Cough still consistent with post viral and postnasal drainage.  Advised to start taking her antihistamine, PPI and consider nasal saline irrigation.  Given red flags for return.

## 2011-04-26 NOTE — Telephone Encounter (Signed)
Norma [name] is calling because she is continuing to cough and it is keeping her up at night.  She would like a refill or a suggestion to what else she can take.

## 2011-05-05 ENCOUNTER — Encounter: Payer: Self-pay | Admitting: Family Medicine

## 2011-05-05 ENCOUNTER — Telehealth: Payer: Self-pay | Admitting: Family Medicine

## 2011-05-05 ENCOUNTER — Ambulatory Visit (INDEPENDENT_AMBULATORY_CARE_PROVIDER_SITE_OTHER): Payer: Self-pay | Admitting: Family Medicine

## 2011-05-05 VITALS — BP 132/90 | HR 93 | Temp 98.0°F | Ht 66.0 in | Wt 237.0 lb

## 2011-05-05 DIAGNOSIS — G894 Chronic pain syndrome: Secondary | ICD-10-CM

## 2011-05-05 DIAGNOSIS — K219 Gastro-esophageal reflux disease without esophagitis: Secondary | ICD-10-CM

## 2011-05-05 DIAGNOSIS — R058 Other specified cough: Secondary | ICD-10-CM

## 2011-05-05 DIAGNOSIS — R739 Hyperglycemia, unspecified: Secondary | ICD-10-CM

## 2011-05-05 DIAGNOSIS — R059 Cough, unspecified: Secondary | ICD-10-CM

## 2011-05-05 DIAGNOSIS — I10 Essential (primary) hypertension: Secondary | ICD-10-CM

## 2011-05-05 DIAGNOSIS — R05 Cough: Secondary | ICD-10-CM

## 2011-05-05 DIAGNOSIS — R7309 Other abnormal glucose: Secondary | ICD-10-CM

## 2011-05-05 LAB — COMPREHENSIVE METABOLIC PANEL
Albumin: 4.2 g/dL (ref 3.5–5.2)
Alkaline Phosphatase: 50 U/L (ref 39–117)
BUN: 12 mg/dL (ref 6–23)
Creat: 0.81 mg/dL (ref 0.50–1.10)
Glucose, Bld: 96 mg/dL (ref 70–99)
Potassium: 3.4 mEq/L — ABNORMAL LOW (ref 3.5–5.3)
Total Bilirubin: 0.6 mg/dL (ref 0.3–1.2)

## 2011-05-05 LAB — POCT URINALYSIS DIPSTICK
Blood, UA: NEGATIVE
Protein, UA: NEGATIVE
Spec Grav, UA: 1.025
Urobilinogen, UA: 1
pH, UA: 6

## 2011-05-05 MED ORDER — HYDROCODONE-ACETAMINOPHEN 7.5-750 MG PO TABS: 1.0000 | ORAL_TABLET | Freq: Three times a day (TID) | ORAL | Status: DC | PRN

## 2011-05-05 MED ORDER — DILTIAZEM HCL ER COATED BEADS 180 MG PO CP24: 180.0000 mg | ORAL_CAPSULE | Freq: Every day | ORAL | Status: DC

## 2011-05-05 NOTE — Telephone Encounter (Signed)
Called pt at home. Left message, A1c 5.8 and pt dose not have diabetes.

## 2011-05-05 NOTE — Patient Instructions (Signed)
Norma [name],  Thank you for coming in to see me today. For BP: restart Diltz, sent to [facility] $9 for 3 month supply.  For you pain: refilled vicodin x 3 month supply. Turn scripts into pharmacy if able or keep in safe place.  For dry mouth and eyes: saline eye drops and fluids/hard candies while I look for secondary causes, like diabetes.  For ab pain: once you are able do exercises to strengthen core. For depression: you are taking the highest dose of celexa already. Call Dr. Pascal Lux to start therapy. Hopefully, you will feel better with improved pain control as well. [name] [phone].  Great job! With continued weight loss.   -Dr. Armen Pickup

## 2011-05-06 ENCOUNTER — Telehealth: Payer: Self-pay | Admitting: Family Medicine

## 2011-05-06 LAB — DRUG SCREEN, URINE
Amphetamine Screen, Ur: NEGATIVE
Benzodiazepines.: NEGATIVE
Marijuana Metabolite: POSITIVE — AB
Methadone: NEGATIVE

## 2011-05-06 MED ORDER — DILTIAZEM HCL ER 90 MG PO CP12: 90.0000 mg | ORAL_CAPSULE | Freq: Two times a day (BID) | ORAL | Status: DC

## 2011-05-06 NOTE — Telephone Encounter (Signed)
Generic diltiazem sent in electronically. 90 mg PO BID.

## 2011-05-06 NOTE — Telephone Encounter (Signed)
Need the generic of Cardizem called to pharmacy instead of the name brand.  That cost too much.  The diltizem is $4.00 at [facility]

## 2011-05-07 ENCOUNTER — Telehealth: Payer: Self-pay | Admitting: Family Medicine

## 2011-05-07 NOTE — Telephone Encounter (Signed)
Called pt at home. Informed her that UDS was positive for marijuana. Told her that i will have to recheck a UDS sometime in the near future. Told her if UDS is positive for marijuana again, I will have to taper her or send her to a pain clinic for her pain medication. She voiced understanding. She said she will not let anything come in the way of getting her pain medication. She said the plan was fair.   She asked about diltiazem, told her diltiazem 90 mg PO BID was sent to pharmacy, this is on the $4 list at [facility].

## 2011-05-10 MED ORDER — RANITIDINE HCL 150 MG PO CAPS: 150.0000 mg | ORAL_CAPSULE | Freq: Two times a day (BID) | ORAL | Status: DC

## 2011-05-10 NOTE — Assessment & Plan Note (Signed)
A: UDS obtained and positive for marijuana. Discussed with pt that marijuana is not allowed as clearly described on her pain contract.  P:  -continue pain medication at current dose. -random UDS at f/u visit.  -if marijuana/illegal substance positive on repeat UDS- will taper current Vicodin dose to off or patient will have to go to pain clinic for management of chronic pain, pt aware of this plan and voiced understanding.

## 2011-05-10 NOTE — Assessment & Plan Note (Signed)
A: BP elevated today.  Meds: complaint with HCTZ.  P:  -continue HCTZ -order diltiazem.  -check CMET.

## 2011-05-10 NOTE — Progress Notes (Signed)
Subjective:     Patient ID: Norma [name], female   DOB: Sep 07, 1960, 51 y.o.   MRN: [id number]  HPI Pt is here for f/u the following problems:  1. Chronic pain: patient takes Vicodin TID for chronic bilateral hip pain. She has had injections into her L hip for trochanteric bursitis and hip osteoarthritis with short term improvement in pain. She is trying to lose weight with diet alone currently. She lost her last prescription and per her pain contract I did not replace it. She has been out of vicodin for one month.    2. HTN: Pt out of diltiazem x 1 month. She is taking HCTZ. Feels that her BP is high sometimes. Complains of intermittent HAs. Denis CP, SOB and LE edema.   3. Dry mouth at night and dry eyes: x 2 months. No medications changes. Pt is smoking marijuana. She denies dizziness, lightheadedness, feeling flush, confusion.   4. L sided abdominal pain: intermittent, moderate pain, non-radiating. Noticed when sitting on the floor playing with grandkids. No associated with food. No N/V/D. Not drinking alcohol. No history of renal stones.   Review of Systems Pertient ROS as per HPI    Objective:   Physical Exam Filed Vitals:   05/05/11 1419  BP: 132/90  Pulse: 93  Temp: 98 F (36.7 C)   Wt Readings from Last 3 Encounters:  05/05/11 237 lb (107.502 kg)  04/26/11 239 lb (108.41 kg)  04/15/11 235 lb 14.4 oz (107.004 kg)   Head: Normocephalic, without obvious abnormality, atraumatic Eyes: conjunctivae/corneas clear. PERRL, EOM's intact.  Throat: lips, mucosa, and tongue normal; teeth and gums normal Lungs: clear to auscultation bilaterally Heart: regular rate and rhythm, S1, S2 normal, no murmur, click, rub or gallop Abdomen: Obese. Skin intact without rashes.  soft, non-tender; bowel sounds normal; no masses,  no organomegaly. No CVA tenderness. Mild soreness to palpation over abdominal wall.  Extremities: extremities normal, atraumatic, no cyanosis or edema Neurologic: Grossly  normal     Assessment:     51 yo F with multiple problems and complaints of pain.  L sided abdominal pain: non-specific. Most likely abdominal wall pain. Possible gastritis. I have also considered pancreatitis and pyelonephritis but both of these very unlikely in this well appearing female with pain off and on for weeks.     Plan:     Checked UA (not suggestive of UTI, no blood). Plan to continue PPI, encourage low fat diet and core strengthening exercises. F/u as needed if pain worsens.

## 2011-05-11 MED ORDER — DILTIAZEM HCL 90 MG PO TABS: 90.0000 mg | ORAL_TABLET | Freq: Two times a day (BID) | ORAL | Status: DC

## 2011-05-11 NOTE — Telephone Encounter (Addendum)
Regular diltiazem sent to pharmacy. Patient called and informed.

## 2011-05-11 NOTE — Telephone Encounter (Signed)
States that the pharmacy told her that because this med is extended release, it will be $94 - needs it to be regular so it will be $4. [facility] [address]

## 2011-05-11 NOTE — Telephone Encounter (Signed)
Addended by: Dessa Phi on: 05/11/2011 04:58 PM   Modules accepted: Orders

## 2011-05-24 ENCOUNTER — Ambulatory Visit (INDEPENDENT_AMBULATORY_CARE_PROVIDER_SITE_OTHER): Payer: Self-pay | Admitting: Family Medicine

## 2011-05-24 ENCOUNTER — Encounter: Payer: Self-pay | Admitting: Family Medicine

## 2011-05-24 VITALS — BP 128/80 | HR 86 | Temp 98.4°F | Ht 66.0 in | Wt 237.2 lb

## 2011-05-24 DIAGNOSIS — M25532 Pain in left wrist: Secondary | ICD-10-CM

## 2011-05-24 DIAGNOSIS — M25519 Pain in unspecified shoulder: Secondary | ICD-10-CM

## 2011-05-24 DIAGNOSIS — M25512 Pain in left shoulder: Secondary | ICD-10-CM

## 2011-05-24 DIAGNOSIS — L301 Dyshidrosis [pompholyx]: Secondary | ICD-10-CM

## 2011-05-24 DIAGNOSIS — R21 Rash and other nonspecific skin eruption: Secondary | ICD-10-CM

## 2011-05-24 DIAGNOSIS — M25539 Pain in unspecified wrist: Secondary | ICD-10-CM

## 2011-05-24 MED ORDER — CYCLOBENZAPRINE HCL 10 MG PO TABS: 10.0000 mg | ORAL_TABLET | Freq: Two times a day (BID) | ORAL | Status: AC | PRN

## 2011-05-24 NOTE — Progress Notes (Signed)
  Subjective:    Patient ID: Norma [name], female    DOB: [birth year], 51 y.o.   MRN: [id number]  HPI 51 year-old female presents for same-day visit to discuss spot on her left breast she also has concerns regarding left shoulder pain and left hand pain.  1. L breast rash: First noticed 3 weeks ago. It is growing in size. Associated symptoms include itching. She denies pain, swelling, nipple discharge, fever, chills or weight loss. She's not treated the rash. She states that the rash is not itching currently. She denies rash or any other part of her body. She denies history of similar rash.  2. L clavicle pain: moderate in severity. No recent trauma. Pain worse with movement. Soreness. She request muscle relaxer for the pain. She is taking all medications as prescribed including vicodin and ketoprofen gel which seems to help the pain some.   3. L hand pain: moderate in severity. Worse with gripping. Associated with weakness. Pain on lateral aspect of L hand (4th and 5th digit). No trauma. No swelling.    Review of Systems Pertinent ROS as per HPI.     Objective:   Physical Exam BP 128/80  Pulse 86  Temp(Src) 98.4 F (36.9 C) (Oral)  Ht 5\' 6"  (1.676 m)  Wt 237 lb 3.2 oz (107.593 kg)  BMI 38.29 kg/m2 General appearance: alert, cooperative and no distress Extremities: extremities normal, atraumatic, no cyanosis or edema Pulses: 2+ and symmetric Skin: Skin color, texture, turgor normal. Small 1x1 cm rash on L outer breast at 11 o'clock. Dry and scaly slightly red. No L axillary lymphadenopathy.  L shoulder: non tender to palpation over clavicle. Full ROM of shoulder.  L hand: Full ROM. Non tender to palpation, no swelling. Pain elicited with internal and external rotation. Strength full. 2+ DP pulses. Skin warm and dry.     Assessment & Plan:

## 2011-05-24 NOTE — Patient Instructions (Signed)
Norma [name],  Thank you for coming in today. There is no yeast or fungus on your skin. Please use the vaseline/eucerin cream as we discussed.  I have sent the flexeril to your pharmacy.  Please wear your splint when working and at night.   Dr. Armen Pickup

## 2011-05-31 DIAGNOSIS — M25532 Pain in left wrist: Secondary | ICD-10-CM | POA: Insufficient documentation

## 2011-05-31 NOTE — Assessment & Plan Note (Signed)
A: no evidence of new fracture or pathology. P: continue conservative management.

## 2011-05-31 NOTE — Assessment & Plan Note (Addendum)
A: xerotic rash on L breast. Improving. Negative fungal scraping. P: recommend hydration with eucrein/vaseline. If this does not improve the rash will restart triamcinolone cream.

## 2011-05-31 NOTE — Assessment & Plan Note (Signed)
A: no effusion, exam not consistent with carpal tunnel or De Quervain's tenosynovitis. However, patient complaining of pain after significant use of wrist yesterday. P: rest, soft splint to wear when cooking.  Close follow-up as patient has multiple pain complaints.

## 2011-06-16 ENCOUNTER — Telehealth: Payer: Self-pay | Admitting: Family Medicine

## 2011-06-16 ENCOUNTER — Ambulatory Visit (INDEPENDENT_AMBULATORY_CARE_PROVIDER_SITE_OTHER): Payer: Self-pay | Admitting: *Deleted

## 2011-06-16 DIAGNOSIS — Z111 Encounter for screening for respiratory tuberculosis: Secondary | ICD-10-CM

## 2011-06-16 NOTE — Telephone Encounter (Signed)
Patient is calling because she lost her copy of her TB test and would like to get another copy.  She would like to be called when it is ready to be picked up.

## 2011-06-16 NOTE — Telephone Encounter (Signed)
Patient notified that letter is ready to pickup.  

## 2011-06-18 ENCOUNTER — Ambulatory Visit (INDEPENDENT_AMBULATORY_CARE_PROVIDER_SITE_OTHER): Payer: Self-pay | Admitting: *Deleted

## 2011-06-18 DIAGNOSIS — IMO0001 Reserved for inherently not codable concepts without codable children: Secondary | ICD-10-CM

## 2011-06-18 DIAGNOSIS — Z111 Encounter for screening for respiratory tuberculosis: Secondary | ICD-10-CM

## 2011-06-18 LAB — TB SKIN TEST
Induration: 0
TB Skin Test: NEGATIVE mm

## 2011-06-21 ENCOUNTER — Other Ambulatory Visit: Payer: Self-pay | Admitting: Family Medicine

## 2011-06-21 DIAGNOSIS — Z1231 Encounter for screening mammogram for malignant neoplasm of breast: Secondary | ICD-10-CM

## 2011-06-24 ENCOUNTER — Other Ambulatory Visit: Payer: Self-pay | Admitting: Family Medicine

## 2011-06-25 ENCOUNTER — Telehealth: Payer: Self-pay | Admitting: Family Medicine

## 2011-06-25 NOTE — Telephone Encounter (Signed)
Refill request

## 2011-06-25 NOTE — Telephone Encounter (Signed)
Patient is calling for a refill on Trazadone to go to [facility] on [address].  She said she had put in the request yesterday and would like this filled before the end of the day so she will have it for the weekend.

## 2011-06-28 NOTE — Telephone Encounter (Signed)
Refill done today. Please let pt know. Also she is to allow a week for refills.

## 2011-06-30 ENCOUNTER — Encounter: Payer: Self-pay | Admitting: Family Medicine

## 2011-06-30 ENCOUNTER — Ambulatory Visit (INDEPENDENT_AMBULATORY_CARE_PROVIDER_SITE_OTHER): Payer: Self-pay | Admitting: Family Medicine

## 2011-06-30 ENCOUNTER — Ambulatory Visit (HOSPITAL_COMMUNITY)
Admission: RE | Admit: 2011-06-30 | Discharge: 2011-06-30 | Disposition: A | Discharge status: Home / Self Care | Payer: Self-pay | Source: Ambulatory Visit | Attending: Family Medicine | Admitting: Family Medicine

## 2011-06-30 VITALS — BP 112/81 | HR 91 | Temp 97.7°F | Ht 66.0 in | Wt 235.0 lb

## 2011-06-30 DIAGNOSIS — G894 Chronic pain syndrome: Secondary | ICD-10-CM

## 2011-06-30 DIAGNOSIS — IMO0002 Reserved for concepts with insufficient information to code with codable children: Secondary | ICD-10-CM

## 2011-06-30 DIAGNOSIS — R42 Dizziness and giddiness: Secondary | ICD-10-CM | POA: Insufficient documentation

## 2011-06-30 DIAGNOSIS — N950 Postmenopausal bleeding: Secondary | ICD-10-CM

## 2011-06-30 DIAGNOSIS — N951 Menopausal and female climacteric states: Secondary | ICD-10-CM

## 2011-06-30 DIAGNOSIS — M79609 Pain in unspecified limb: Secondary | ICD-10-CM | POA: Insufficient documentation

## 2011-06-30 DIAGNOSIS — M25559 Pain in unspecified hip: Secondary | ICD-10-CM | POA: Insufficient documentation

## 2011-06-30 DIAGNOSIS — M5137 Other intervertebral disc degeneration, lumbosacral region: Secondary | ICD-10-CM | POA: Insufficient documentation

## 2011-06-30 DIAGNOSIS — M51379 Other intervertebral disc degeneration, lumbosacral region without mention of lumbar back pain or lower extremity pain: Secondary | ICD-10-CM | POA: Insufficient documentation

## 2011-06-30 DIAGNOSIS — R232 Flushing: Secondary | ICD-10-CM

## 2011-06-30 MED ORDER — TRIAMCINOLONE ACETONIDE 0.1 % EX CREA: TOPICAL_CREAM | Freq: Two times a day (BID) | CUTANEOUS | Status: DC

## 2011-06-30 MED ORDER — ESCITALOPRAM OXALATE 10 MG PO TABS: 10.0000 mg | ORAL_TABLET | Freq: Every day | ORAL | Status: DC

## 2011-06-30 MED ORDER — HYDROCODONE-ACETAMINOPHEN 7.5-750 MG PO TABS: 1.0000 | ORAL_TABLET | Freq: Three times a day (TID) | ORAL | Status: DC | PRN

## 2011-06-30 NOTE — Assessment & Plan Note (Signed)
Hot flashes daily. Trial of lexapro.

## 2011-06-30 NOTE — Assessment & Plan Note (Signed)
Refill vicodin x 3 month supply.

## 2011-06-30 NOTE — Assessment & Plan Note (Signed)
Dizziness upon standing and headaches. Normal orthostatics. Normal neuro exam. Reassurance and close f/u. Marland Kitchen Pain control.

## 2011-06-30 NOTE — Patient Instructions (Signed)
Norma [name],  Try the lexapro for hot flashes may take 1/2 tab or whole tab.  Go ahead and get your x-rays done.   Please buy a cane for balance.  Plan on seeing me again to f/u symptoms in 1-2 months.   Dr. Armen Pickup

## 2011-06-30 NOTE — Progress Notes (Signed)
Subjective:     Patient ID: Norma [name], female   DOB: 1960/06/27, 51 y.o.   MRN: [id number]  HPI 51 year old female presents as any physical complaint dizziness upon standing. The dizziness has occurred on and off for the last 10 days. She is not dizzy today when walking from the waiting room to the clinic room. She is currently not dizzy. Associated symptoms include left-sided headache that comes and goes. As well as worsening low back pain with radiation to the bilateral legs posteriorly. She denies  Fever, nausea, vomiting.she has chronic low back pain. For which she takes Vicodin she feels that her pain is getting worse. She falls. She denies fecal and urinary incontinence.    In addition she reports annoying hot flashes occuring multiple times per day. She request medication to reduce hot flashes.   Review of Systems As per HPI She denies tobacco, etoh and illicit drug use (marijuana specifically).     Objective:   Physical Exam BP 112/81  Pulse 91  Temp(Src) 97.7 F (36.5 C) (Oral)  Ht 5\' 6"  (1.676 m)  Wt 235 lb (106.595 kg)  BMI 37.93 kg/m2 orthostatic vital signs: lying: 120/79, P 85; sitting 121/82, P 83; standing 112/81, P 91. General appearance: alert, cooperative and no distress Head: Normocephalic, without obvious abnormality, atraumatic Eyes: conjunctivae/corneas clear. PERRL, EOM's intact.  Ears: normal TM and external ear canal left ear and abnormal external canal right ear - cutaneous horn. TM normal.  Throat: lips, mucosa, and tongue normal; teeth and gums normal Lungs: clear to auscultation bilaterally Heart: regular rate and rhythm, S1, S2 normal, no murmur, click, rub or gallop Neurologic: Alert and oriented X 3, normal strength and tone. Normal symmetric reflexes. Normal coordination and gait Back Exam: Inspection: symmetrical, spine midline.  Motion: Full ROM, pain with extension and leftward bending.  SLR seated: Negative on R                     SLR lying:  Positive on L  XSLR seated:  Positive on R                   XSLR lying: negative on L Seated HS Flexibility:  Palpable tenderness:  low back from L3-L5 on the L, no piriformis tenderness or trigger points appreciated.  FABER: negative bilaterally  Sensory change: full sensation  Reflex change: 2+ patellar bilaterally.   Strength at foot Plantar-flexion: 5 / 5    Dorsi-flexion:  5 / 5    Eversion:  5/ 5   Inversion: 5 / 5 Leg strength Quad:  5/ 5   Hamstring:  5/ 5   Hip flexor:  5/ 5   Hip abductors: 5 / 5 Gait: normal      Assessment:         Plan:

## 2011-06-30 NOTE — Assessment & Plan Note (Signed)
Low back pain with radiation. Repeat L spine x-ray for comparison/progression of pathology. Reccommended cane.

## 2011-07-01 ENCOUNTER — Telehealth: Payer: Self-pay | Admitting: Family Medicine

## 2011-07-01 DIAGNOSIS — R232 Flushing: Secondary | ICD-10-CM

## 2011-07-01 NOTE — Telephone Encounter (Signed)
Per [facility]---Lexapro not available on $4 plan.  Will route note to Dr. Armen Pickup to see if she wants to change to another med that is on $4 plan.  Patient informed we will call her back.  Gaylene Brooks, RN

## 2011-07-01 NOTE — Telephone Encounter (Signed)
FYI---Dr. Armen Pickup, I closed this chart by mistake.  Please address.  Thanks.   Gaylene Brooks, RN

## 2011-07-01 NOTE — Telephone Encounter (Signed)
Prescription for Lexapro was too expensive.  It was sent to [facility] and she thought it was on the $4 plan.  Can this please be corrected.

## 2011-07-02 MED ORDER — ESCITALOPRAM OXALATE 10 MG PO TABS: 10.0000 mg | ORAL_TABLET | Freq: Every day | ORAL | Status: DC

## 2011-07-02 NOTE — Telephone Encounter (Addendum)
Called patient. [facility]  Tried to call [facility] to cancel the order but they are closed until 2 PM.   [facility].

## 2011-07-02 NOTE — Telephone Encounter (Signed)
Addended by: Dessa Phi on: 07/02/2011 01:44 PM   Modules accepted: Orders

## 2011-07-07 ENCOUNTER — Telehealth: Payer: Self-pay | Admitting: Family Medicine

## 2011-07-07 NOTE — Telephone Encounter (Signed)
Message copied by Dessa Phi on Wed Jul 07, 2011  8:53 AM ------      Message from: Nestor Ramp      Created: Fri Jul 02, 2011  5:12 PM                   ----- Message -----         From: Rad Results In Interface         Sent: 06/30/2011   5:09 PM           To: Nestor Ramp, MD

## 2011-07-07 NOTE — Telephone Encounter (Signed)
Called patient to let her know that I reviewed her lumbar x-rays which showed worsening in L5-S1 disk space narrowing and arthropathy. Patient will consider next step in treatment. Possible referral back to Sports Medicine clinic.

## 2011-07-08 ENCOUNTER — Ambulatory Visit (INDEPENDENT_AMBULATORY_CARE_PROVIDER_SITE_OTHER): Payer: Self-pay | Admitting: Family Medicine

## 2011-07-08 ENCOUNTER — Encounter: Payer: Self-pay | Admitting: Family Medicine

## 2011-07-08 VITALS — BP 112/82 | HR 86 | Temp 98.2°F | Ht 67.0 in | Wt 234.0 lb

## 2011-07-08 DIAGNOSIS — M7918 Myalgia, other site: Secondary | ICD-10-CM

## 2011-07-08 DIAGNOSIS — IMO0001 Reserved for inherently not codable concepts without codable children: Secondary | ICD-10-CM

## 2011-07-08 DIAGNOSIS — M899 Disorder of bone, unspecified: Secondary | ICD-10-CM | POA: Insufficient documentation

## 2011-07-08 DIAGNOSIS — I10 Essential (primary) hypertension: Secondary | ICD-10-CM

## 2011-07-08 DIAGNOSIS — IMO0002 Reserved for concepts with insufficient information to code with codable children: Secondary | ICD-10-CM

## 2011-07-08 NOTE — Patient Instructions (Signed)
Thank you for coming into clinic today. I agree with Dr. Armen Pickup analysis of your xrays. Please set up an appointment with Sports Medicine by calling [phone]. I will send her a message regarding my recommendation for possible orthopedic surgery intervention. Please take an NSAID (ibuprofen, motrin, advil, aleve) for your upper back pain. You may take up to 800mg  4 times a day for a total of 3200mg  per day. Please also try massage, heat/cold packs for relief. Please come back as needed.

## 2011-07-08 NOTE — Assessment & Plan Note (Addendum)
Worsening degenerative changes of L spine. No improvement w/ exercise and vicodin. Discussed weight loss, exercise, and NSAIDs for pain relief. Will refer to sports med. Consider Orthopedic referral for surgical options. Consider Neurontin if not already tried w/ pt.

## 2011-07-08 NOTE — Assessment & Plan Note (Signed)
No appreciable point tenderness or muscle spasm. Likely musculoskelatal pain. Pt to take NSAIDs, try massage/stretches, and cold/heat for relief. Will likely self resolve. If worsens and develops trigger point would consider injection vs muscle relaxer.

## 2011-07-08 NOTE — Progress Notes (Signed)
  Subjective:    Patient ID: Norma [name], female    DOB: 1960/09/04, 51 y.o.   MRN: [id number]  HPI CC: Low Back pain, Upper back pain  Low back pain: Pt recently had L spine Xray. Reviewed film and previous film from 2009 w/ pt. Pain continues to be sharp and intense that is only relieved by vicodin or rest. Pain radiates down posterior thighs to feet. 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 Exercise has also provided some long term improvent. (Likely from weight loss).  Denies change in LE sensation, loss of bowel or bladder function, muscle strength  Upper back pain: Started this am when lifting arms above her head. Pain is sharp and feels like a knott in back. Pain is worsens w/ certain movements. Pain is improved w/ rest. Denies radiation of pain. Vicodin relieves pain. Denies previous injury, or similar symptoms in the past.   Review of Systems  See HPI w/ the following additions Denies: HA, syncope, palpitations.      Objective:   Physical Exam  Musc: no point tenderness of upper R back. Pt obese. No tenderness on deep palpation throughout upper back. Tenderness on palpation of lower back. Posterior thigh pain on straight leg raise. 3+ strength bilat in LE.       Assessment & Plan:

## 2011-07-08 NOTE — Assessment & Plan Note (Signed)
Well controlled in office today. Compliant w/ current medications. No change

## 2011-07-16 ENCOUNTER — Ambulatory Visit (HOSPITAL_COMMUNITY): Payer: Self-pay

## 2011-07-23 ENCOUNTER — Ambulatory Visit: Payer: Self-pay | Admitting: Family Medicine

## 2011-07-29 ENCOUNTER — Telehealth: Payer: Self-pay | Admitting: Family Medicine

## 2011-07-29 ENCOUNTER — Telehealth: Payer: Self-pay | Admitting: *Deleted

## 2011-07-29 NOTE — Telephone Encounter (Signed)
Wants to change ranitidine (ZANTAC) 150 MG  To Omeprazole  [facility]

## 2011-07-29 NOTE — Telephone Encounter (Signed)
Message left on our office voicemail from [place] at [facility].  Rx was called in for Lexapro 10mg  #30 x 11 refills on 07/02/2011.  Pharmacy no longer able to get Lexapro.  Wants to know if we can change to Cymbalta or Zoloft.  Will route note to Dr. Armen Pickup and call pharmacy back.  Gaylene Brooks, RN

## 2011-07-29 NOTE — Telephone Encounter (Signed)
Will forward message to Dr. Funches .  

## 2011-07-30 ENCOUNTER — Ambulatory Visit: Payer: Self-pay | Admitting: Family Medicine

## 2011-07-30 MED ORDER — VENLAFAXINE HCL ER 75 MG PO CP24: 75.0000 mg | ORAL_CAPSULE | Freq: Every day | ORAL | Status: DC

## 2011-07-30 MED ORDER — OMEPRAZOLE 20 MG PO CPDR: 20.0000 mg | DELAYED_RELEASE_CAPSULE | Freq: Every day | ORAL | Status: DC

## 2011-07-30 NOTE — Telephone Encounter (Signed)
Called back to pharmacy. Lexapro discontinued. Effexor started.

## 2011-07-30 NOTE — Telephone Encounter (Signed)
Called patient at home to let her know that [facility] pharmacy did not have Lexapro so I ordered Effexor for hot flashes.   Also stopped ranitidine and ordered omeprazole 20 mg q D.

## 2011-08-11 ENCOUNTER — Ambulatory Visit (HOSPITAL_COMMUNITY): Payer: Self-pay

## 2011-09-02 ENCOUNTER — Ambulatory Visit (HOSPITAL_COMMUNITY): Payer: Self-pay | Attending: Family Medicine

## 2011-09-08 ENCOUNTER — Encounter: Payer: Self-pay | Admitting: Family Medicine

## 2011-09-08 ENCOUNTER — Ambulatory Visit (INDEPENDENT_AMBULATORY_CARE_PROVIDER_SITE_OTHER): Payer: Self-pay | Admitting: Family Medicine

## 2011-09-08 VITALS — BP 116/81 | HR 97 | Temp 97.9°F | Ht 67.0 in | Wt 238.1 lb

## 2011-09-08 DIAGNOSIS — R0789 Other chest pain: Secondary | ICD-10-CM | POA: Insufficient documentation

## 2011-09-08 DIAGNOSIS — M7918 Myalgia, other site: Secondary | ICD-10-CM

## 2011-09-08 DIAGNOSIS — IMO0001 Reserved for inherently not codable concepts without codable children: Secondary | ICD-10-CM

## 2011-09-08 LAB — COMPREHENSIVE METABOLIC PANEL
ALT: 22 U/L (ref 0–35)
BUN: 14 mg/dL (ref 6–23)
CO2: 25 mEq/L (ref 19–32)
Calcium: 9.5 mg/dL (ref 8.4–10.5)
Chloride: 100 mEq/L (ref 96–112)
Creat: 0.93 mg/dL (ref 0.50–1.10)
Glucose, Bld: 111 mg/dL — ABNORMAL HIGH (ref 70–99)
Total Bilirubin: 0.5 mg/dL (ref 0.3–1.2)

## 2011-09-08 LAB — LIPASE: Lipase: 65 U/L (ref 0–75)

## 2011-09-08 MED ORDER — DICLOFENAC SODIUM 75 MG PO TBEC: 75.0000 mg | DELAYED_RELEASE_TABLET | Freq: Two times a day (BID) | ORAL | Status: DC

## 2011-09-08 MED ORDER — KETOROLAC TROMETHAMINE 60 MG/2ML IM SOLN
60.0000 mg | Freq: Once | INTRAMUSCULAR | Status: AC
  Administered 2011-09-08: 60 mg via INTRAMUSCULAR

## 2011-09-08 MED ORDER — POLYETHYLENE GLYCOL 3350 17 GM/SCOOP PO POWD: 17.0000 g | Freq: Every day | ORAL | Status: DC

## 2011-09-08 NOTE — Patient Instructions (Signed)
Lindia,  Thank you for coming in to see me today.  For your pain and headache: take diclofenac twice a day as needed. Use warm compress on your R side.  I will call if your blood work is abnormal or send a letter if normal.   Please f/u in 2 weeks to discuss chronic pains and for a physical.   Dr. Armen Pickup

## 2011-09-08 NOTE — Progress Notes (Signed)
Subjective:     Patient ID: Norma [name], female   DOB: April 18, 1961, 51 y.o.   MRN: [id number]  HPI 51 yo F presents for same day visit to discuss the following: 1. 3 weeks of R sided low chest pain: pain gradually worsening. Unresponsive to Vicodin. Pain R low chest, sharp, intermittent, worse at night, interfering with sleep, worse with coughing, radiates toward the back. Denies trauma, productive cough, fever, leg swelling, and nausea/vomiting.    Reviewed history: patient is a non-smoker. Has significant history of chronic pain in multiple sites.   Review of Systems As per HPI    Objective:   Physical Exam BP 116/81  Pulse 97  Temp(Src) 97.9 F (36.6 C) (Oral)  Ht 5\' 7"  (1.702 m)  Wt 238 lb 1.6 oz (108.001 kg)  BMI 37.29 kg/m2,  O2 sat 97 % on RA General appearance: alert, cooperative and no distress Eyes: conjunctivae/corneas clear with mild erythema. PERRL, EOM's intact.  Chest: slight tenderness to palpation on R chest along inferior rib. Chest wall moves symmetrically. Lungs clear to auscultation bilaterally. S1S2 RRR.  Back: symmetric, no curvature. ROM normal. No CVA tenderness. Abdomen: soft, non-tender; bowel sounds normal; no masses,  no organomegaly Extremities: extremities normal, atraumatic, no cyanosis or edema     Assessment and Plan:   A: Low chest pain subacute. I have considered PE and cardiac chest pain, both unlikely given nature of pain and PE findings. I have considered liver pathology patient with hep C carrier state. I have also considered GERD, patient taking PPI. Off all differentials, MSK pain most likely. P: -toradol IM x 1 -diclofenac -check CMET and lipase -f/u in 1 month

## 2011-09-08 NOTE — Assessment & Plan Note (Signed)
A: Low chest pain subacute. I have considered PE and cardiac chest pain, both unlikely given nature of pain and PE findings. I have considered liver pathology patient with hep C carrier state. I have also considered GERD, patient taking PPI. Off all differentials, MSK pain most likely. P: -toradol IM x 1 -diclofenac -check CMET and lipase -f/u in 1 month

## 2011-09-09 ENCOUNTER — Encounter: Payer: Self-pay | Admitting: Family Medicine

## 2011-09-23 ENCOUNTER — Other Ambulatory Visit (HOSPITAL_COMMUNITY)
Admission: RE | Admit: 2011-09-23 | Discharge: 2011-09-23 | Disposition: A | Discharge status: Home / Self Care | Payer: Self-pay | Source: Ambulatory Visit | Attending: Family Medicine | Admitting: Family Medicine

## 2011-09-23 ENCOUNTER — Ambulatory Visit (INDEPENDENT_AMBULATORY_CARE_PROVIDER_SITE_OTHER): Payer: Self-pay | Admitting: Family Medicine

## 2011-09-23 VITALS — BP 131/85 | HR 93 | Ht 67.0 in | Wt 240.0 lb

## 2011-09-23 DIAGNOSIS — N898 Other specified noninflammatory disorders of vagina: Secondary | ICD-10-CM | POA: Insufficient documentation

## 2011-09-23 DIAGNOSIS — Z113 Encounter for screening for infections with a predominantly sexual mode of transmission: Secondary | ICD-10-CM | POA: Insufficient documentation

## 2011-09-23 LAB — POCT WET PREP (WET MOUNT)

## 2011-09-23 NOTE — Assessment & Plan Note (Signed)
GC/Chlam and wet prep obtained.  HIV and RPR done since pt unsure if tested since last unprotected sex.  + fishy odor suggestive of BV- wet prep pending.  Will call pt with results and treatment plan. Reviewed red flags for return.

## 2011-09-23 NOTE — Patient Instructions (Signed)
I will mail you your results or call you with the results once they are back.

## 2011-09-23 NOTE — Progress Notes (Signed)
  Subjective:    Patient ID: Norma [name], female    DOB: [birth year], 51 y.o.   MRN: [id number]  HPI Vaginal discharge: Patient reports vaginal discharge x1-2 weeks. White in color. Foul odor. Has not had sex during past year. When she last had sex did have unprotected sex. Unsure if she has had any exposure to sexually transmitted infections. Agrees to be checked for these things today. Negative GC Chlamydia in the fall of last year, but the patient is not sure if she was sexually active after that time.  No fever. No burning with urination. No urine retention. A urinary frequency. Occasional back pain. Occasional lower abdominal cramping. No menstruation times a few years. Has gone through menopause. Eating well. No nausea. No vomiting. No diarrhea. No body aches.  Smoking status reviewed.   Review of Systems    as per above. Objective:   Physical Exam  Constitutional: She appears well-developed and well-nourished.  HENT:  Head: Normocephalic and atraumatic.  Cardiovascular: Normal rate.   Pulmonary/Chest: Effort normal. No respiratory distress.  Genitourinary: Vagina normal and uterus normal. There is no rash, tenderness, lesion or injury on the right labia. There is no rash, tenderness, lesion or injury on the left labia. Cervix exhibits discharge (scant clear discharge- + fishy odor). Cervix exhibits no motion tenderness and no friability. Right adnexum displays no mass, no tenderness and no fullness. Left adnexum displays no mass, no tenderness and no fullness.  Musculoskeletal:       No CVA tenderness  Neurological: She is alert. She exhibits normal muscle tone.  Skin: No rash noted.  Psychiatric: She has a normal mood and affect. Her behavior is normal.          Assessment & Plan:

## 2011-09-24 ENCOUNTER — Telehealth: Payer: Self-pay | Admitting: Family Medicine

## 2011-09-24 ENCOUNTER — Other Ambulatory Visit: Payer: Self-pay | Admitting: Family Medicine

## 2011-09-24 MED ORDER — METRONIDAZOLE 500 MG PO TABS: 500.0000 mg | ORAL_TABLET | Freq: Two times a day (BID) | ORAL | Status: AC

## 2011-09-24 NOTE — Telephone Encounter (Signed)
Attempt to call pt to discuss lab results: All labs were normal.  Cause of vaginal discharge and odor is unclear.  Will prescribe flagyl 500mg  po bid x 7 days- to see if this helps since the odor and discharge that I saw on exam yesterday seemed to suggest bacterial vaginosis.  (which pt has had in the past)  If no improvement in symptoms pt is to return.  Left message for patient to call back.  Please give her this message when she returns call.  rx sent to pharmacy already.

## 2011-09-29 ENCOUNTER — Telehealth: Payer: Self-pay | Admitting: Family Medicine

## 2011-09-29 ENCOUNTER — Encounter: Payer: Self-pay | Admitting: Family Medicine

## 2011-09-29 NOTE — Telephone Encounter (Signed)
Attempt x 1 to call patient and review results. No answer.

## 2011-09-30 NOTE — Telephone Encounter (Signed)
Discussed lab results with patient on phone today.

## 2011-10-13 ENCOUNTER — Telehealth: Payer: Self-pay | Admitting: *Deleted

## 2011-10-13 ENCOUNTER — Ambulatory Visit (INDEPENDENT_AMBULATORY_CARE_PROVIDER_SITE_OTHER): Payer: Self-pay | Admitting: Family Medicine

## 2011-10-13 ENCOUNTER — Encounter: Payer: Self-pay | Admitting: Family Medicine

## 2011-10-13 ENCOUNTER — Ambulatory Visit (HOSPITAL_COMMUNITY)
Admission: RE | Admit: 2011-10-13 | Discharge: 2011-10-13 | Disposition: A | Discharge status: Home / Self Care | Payer: Self-pay | Source: Ambulatory Visit | Attending: Family Medicine | Admitting: Family Medicine

## 2011-10-13 VITALS — BP 124/77 | HR 89 | Temp 98.1°F | Ht 67.0 in | Wt 239.0 lb

## 2011-10-13 DIAGNOSIS — M25579 Pain in unspecified ankle and joints of unspecified foot: Secondary | ICD-10-CM | POA: Insufficient documentation

## 2011-10-13 NOTE — Telephone Encounter (Signed)
Message left on our voicemail.  Patient requesting appt to have right ankle evaluated.  Returned call to patient and left message to call our office back.  Gaylene Brooks, RN

## 2011-10-13 NOTE — Patient Instructions (Addendum)
Please go to the [facility] to have your x-ray done Please make an appointment at the front desk on your way out to followup with sports medicine at their next available appointment  Hopefully they will have some suggestions for what you with your pain We can work on getting you to an orthopedic doctor if we need to

## 2011-10-13 NOTE — Progress Notes (Signed)
  Subjective:    Patient ID: Norma [name], female    DOB: 28-Mar-1961, 51 y.o.   MRN: [id number]  HPI Ankle broken in 2007, 3 surgeries since then.  1 week ago began to feel different and achy.  No injury.  Now has pain with weight bearing, worse in AM and with external rotation.  No fevers or chills.  No swelling. She is not using a brace or other support. She does have some over-the-counter insoles in her shoes.   Review of Systems See above    Objective:   Physical Exam Vital signs reviewed General appearance - alert, well appearing, and in no distress Ankle-limited range of motion. No erythema or swelling no heat.  Tender to palpation along lateral and medial malleoli.     Assessment & Plan:

## 2011-10-14 ENCOUNTER — Encounter: Payer: Self-pay | Admitting: Family Medicine

## 2011-10-14 NOTE — Assessment & Plan Note (Signed)
Ankle pain in setting of multiple orthopedic interventions. No evidence of infection or gout today. Plan to get x-rays and refer to sports medicine in case this could be benefited by a brace or orthotics. Patient is without insurance and does not currently have an orthopedic doctor.

## 2011-10-18 ENCOUNTER — Ambulatory Visit (INDEPENDENT_AMBULATORY_CARE_PROVIDER_SITE_OTHER): Payer: Self-pay | Admitting: Family Medicine

## 2011-10-18 VITALS — BP 143/87

## 2011-10-18 DIAGNOSIS — M25579 Pain in unspecified ankle and joints of unspecified foot: Secondary | ICD-10-CM

## 2011-10-20 NOTE — Progress Notes (Signed)
  Subjective:    Patient ID: Norma [name], female    DOB: 1961/01/29, 51 y.o.   MRN: [id number]  HPI  Right ankle pain worsening over the last 2 weeks. Has had chronic problems with the ankle since fracture requiring surgical fixation with plate and screw in 2007. Over the last 2 weeks she's noticed increased pain on the medial side of the ankle and she's noticed some swelling on the top part of the ankle. She's had no new injury that she's aware of. She's not been doing more standing or walking and has not used any new shoes. Pain is constant 4-7/10, worse with a lot of standing. Worse at the end of the day. Released somewhat by rest but never completely gone. PERTINENT  PMH / PSH: History of. ORIF right ankle 2007 approximately IMAGING: Right ankle x-ray 10/13/2011. The surgical hardware remains in place. The plate along the fibula and then a separate screw across the prior fracture of the fibula. This is not a true mortise view however there appears to be widened space between the medial portion of the talus and the tibia. Radiology comment on this and it was also present on previous x-ray. There is a small calcified fragment distal to the medial malleolus that was also present on prior x-ray.  Review of Systems Denies weight gain, fever, sweats, chills. Has noted no numbness or tingling in the ankle. Has noted swelling of the ankle. The calf has been pain-free.    Objective:   Physical Exam  Vital signs reviewed. GENERAL: Well developed, well nourished, no acute distress ANKLE: Right palpable hardware along the distal fibula. There is no skin disruption, no erythema or ecchymoses. There is a small amount of ankle effusion noted anteriorly. She is tender along the medial portion of the ankle joint but there is no specific area of tenderness. Inversion is limited by surgical hardware. She has intact strength in inversion and eversion, plantarflexion and dorsiflexion. GAIT: On stance phase the  right ankle a seems a very valgus position at the tibiotalar joint.      Assessment & Plan:

## 2011-10-20 NOTE — Assessment & Plan Note (Addendum)
I think her fixation is stable. I went back to her previous x-rays and they all showed this widening of the medial joint space. I suspect that does put some ligamentous strain on the medial part of her ankle. After evaluation of her gait, I think her current problems are coming from the valgus stress. I placed a medial to lateral wedge in her shoe. We'll try this for 2-3 weeks and now see her back. Likely she will need custom molded orthotics.   Also once we improve her pain to the point where she can tolerate it, I think we could start her on ankle rehabilitation for home exercise program.

## 2011-10-28 ENCOUNTER — Ambulatory Visit (HOSPITAL_COMMUNITY): Payer: Self-pay

## 2011-11-02 ENCOUNTER — Ambulatory Visit (INDEPENDENT_AMBULATORY_CARE_PROVIDER_SITE_OTHER): Payer: Self-pay | Admitting: Family Medicine

## 2011-11-02 ENCOUNTER — Encounter: Payer: Self-pay | Admitting: Family Medicine

## 2011-11-02 ENCOUNTER — Ambulatory Visit (HOSPITAL_COMMUNITY)
Admission: RE | Admit: 2011-11-02 | Discharge: 2011-11-02 | Disposition: A | Discharge status: Home / Self Care | Payer: Self-pay | Source: Ambulatory Visit | Attending: Family Medicine | Admitting: Family Medicine

## 2011-11-02 VITALS — BP 130/83 | HR 80 | Temp 98.4°F | Ht 67.0 in | Wt 242.0 lb

## 2011-11-02 DIAGNOSIS — R0602 Shortness of breath: Secondary | ICD-10-CM | POA: Insufficient documentation

## 2011-11-02 DIAGNOSIS — G894 Chronic pain syndrome: Secondary | ICD-10-CM | POA: Insufficient documentation

## 2011-11-02 DIAGNOSIS — I10 Essential (primary) hypertension: Secondary | ICD-10-CM

## 2011-11-02 DIAGNOSIS — M940 Chondrocostal junction syndrome [Tietze]: Secondary | ICD-10-CM

## 2011-11-02 MED ORDER — MELOXICAM 15 MG PO TABS: 15.0000 mg | ORAL_TABLET | Freq: Every day | ORAL | Status: DC

## 2011-11-02 MED ORDER — HYDROCODONE-ACETAMINOPHEN 7.5-750 MG PO TABS: 1.0000 | ORAL_TABLET | Freq: Three times a day (TID) | ORAL | Status: DC | PRN

## 2011-11-02 MED ORDER — CITALOPRAM HYDROBROMIDE 40 MG PO TABS: 40.0000 mg | ORAL_TABLET | Freq: Every day | ORAL | Status: DC

## 2011-11-02 MED ORDER — HYDROCHLOROTHIAZIDE 25 MG PO TABS: 25.0000 mg | ORAL_TABLET | Freq: Every day | ORAL | Status: DC

## 2011-11-02 NOTE — Progress Notes (Signed)
Subjective:     Patient ID: Norma [name], female   DOB: 04-Aug-1960, 51 y.o.   MRN: [id number]  HPI 51 yo F presents for medication refill and to discuss the following:  1. R sided pain: persistent. Comes and goes. Sharp and worse with deep breathing. No SOB, No fever, no trauma.   2. Chronic back pain: persistent. Controlled with Vicodin. She does not have to take more than prescribed. She is not interested in cutting Vicodin down. Has gained weight. Denies fever, chills, night sweats.   Med history: denies smoking  Review of Systems As per HPI     Objective:   Physical Exam BP 130/83  Pulse 80  Temp 98.4 F (36.9 C) (Oral)  Ht 5\' 7"  (1.702 m)  Wt 242 lb (109.77 kg)  BMI 37.90 kg/m2 General appearance: alert, cooperative and no distress Chest/Lungs: clear to auscultation bilaterally. TTP on R rib cage.  Abdomen: soft, non-tender; bowel sounds normal; no masses,  no organomegaly and obese Assessment and Plan:

## 2011-11-02 NOTE — Patient Instructions (Addendum)
Norma [name],  Thank you for coming in to me today.  Please go for your chest x-ray to evaluate your pain.   Please schedule a nutrition visit with me and Wyona Almas (under sykes schedule) for this Thursday July 18:  -11 AM for you -10:30 or 11:30 for your son Norma [name].   Schedule [facility] follow up as well.   Please decrease HCTZ to 25 mg daily.   Dr. Armen Pickup

## 2011-11-02 NOTE — Assessment & Plan Note (Signed)
A: r sided rib pain x 1 month. Normal x-ray/negative for rib fracture. No rash. Costochondritis likely. P: pain control with mobic and reassurance.

## 2011-11-02 NOTE — Assessment & Plan Note (Signed)
A: stable on vicodin. Patient not willing to step down to 2 pills daily prn. P: -refill vicodin. -discussed the importance of weight loss and referred patient to nutrition clinic with me this week.

## 2011-11-03 ENCOUNTER — Telehealth: Payer: Self-pay | Admitting: Family Medicine

## 2011-11-03 NOTE — Telephone Encounter (Signed)
Called patient left VM.   1. negative CXR, no rib fracture, likely costochondritis which should be helped with mobic.   2. Nutrition visit not scheduled, I will take the liberty of scheduling for her 11 AM  and her son 11:30 AM. Asked her to call if she will not be able to make this visit.

## 2011-11-04 ENCOUNTER — Ambulatory Visit: Payer: Self-pay | Admitting: Family Medicine

## 2011-11-05 ENCOUNTER — Ambulatory Visit: Payer: Self-pay | Admitting: Family Medicine

## 2011-11-15 ENCOUNTER — Telehealth: Payer: Self-pay | Admitting: Family Medicine

## 2011-11-15 NOTE — Telephone Encounter (Signed)
Patient is calling because she has been exposed to Shingles and she doesn't know what kind of symptoms she should watch for or if she should get a vaccintation.  She just wants to speak to the nurse about what to expect.

## 2011-11-16 NOTE — Telephone Encounter (Signed)
Spoke with patient yesterday and  she states she has had chicken pox.  Explained that because she is immune due to having had chicken pox  she will not contract  Shingles from someone else . However she can have shingles because she has had chicken pox and the virus is lying dormant in her system .

## 2011-11-18 ENCOUNTER — Ambulatory Visit (HOSPITAL_COMMUNITY): Payer: Self-pay

## 2011-12-03 ENCOUNTER — Other Ambulatory Visit: Payer: Self-pay | Admitting: Family Medicine

## 2011-12-06 NOTE — Telephone Encounter (Signed)
Gave scripts for 3 month supply on 11/02/11. No refill request will be honored until 02/02/12.

## 2011-12-29 ENCOUNTER — Ambulatory Visit (INDEPENDENT_AMBULATORY_CARE_PROVIDER_SITE_OTHER): Payer: Self-pay | Admitting: Family Medicine

## 2011-12-29 ENCOUNTER — Encounter: Payer: Self-pay | Admitting: Family Medicine

## 2011-12-29 ENCOUNTER — Other Ambulatory Visit (HOSPITAL_COMMUNITY)
Admission: RE | Admit: 2011-12-29 | Discharge: 2011-12-29 | Disposition: A | Discharge status: Home / Self Care | Payer: Self-pay | Source: Ambulatory Visit | Attending: Family Medicine | Admitting: Family Medicine

## 2011-12-29 VITALS — BP 136/87 | HR 94 | Temp 98.1°F | Ht 67.0 in | Wt 236.0 lb

## 2011-12-29 DIAGNOSIS — H9190 Unspecified hearing loss, unspecified ear: Secondary | ICD-10-CM

## 2011-12-29 DIAGNOSIS — Z113 Encounter for screening for infections with a predominantly sexual mode of transmission: Secondary | ICD-10-CM | POA: Insufficient documentation

## 2011-12-29 DIAGNOSIS — N760 Acute vaginitis: Secondary | ICD-10-CM | POA: Insufficient documentation

## 2011-12-29 LAB — POCT WET PREP (WET MOUNT): Clue Cells Wet Prep Whiff POC: NEGATIVE

## 2011-12-29 NOTE — Assessment & Plan Note (Signed)
Will refer to Audiology for evaluation of hearing, do not feel that the bump in her ear is contributing.

## 2011-12-29 NOTE — Progress Notes (Signed)
  Subjective:    Patient ID: Norma [name], female    DOB: 1960/07/09, 51 y.o.   MRN: [id number]  HPI  Norma [name] comes in with two complaints:   She is having a vaginal discharge and odor, it is white to clear, but the smell is bothering her.  She last had sex about to or three weeks ago.  She has no pelvic or abdominal pain, fever/chills/nausea/vomiting. She denies known contact with STI's.   She also is complaining of a bump in her R ear and decreased hearing in that ear.  This has been going on for several months. No injury.   Review of Systems See HPI    Objective:   Physical Exam BP 136/87  Pulse 94  Temp 98.1 F (36.7 C) (Oral)  Ht 5\' 7"  (1.702 m)  Wt 236 lb (107.049 kg)  BMI 36.96 kg/m2 Ears: normal TM's bilaterally, there is a small bump at entry of R ear canal, left normal.  Pelvic: cervix normal in appearance, external genitalia normal, no adnexal masses or tenderness, no cervical motion tenderness, rectovaginal septum normal, uterus normal size, shape, and consistency and Vagina normal with thin white discharge.        Assessment & Plan:

## 2011-12-29 NOTE — Assessment & Plan Note (Signed)
Wet prep normal today, GC/Chlamydia sent.  Suspect this may be a physiologic discharge and reassured patient.

## 2011-12-29 NOTE — Patient Instructions (Addendum)
It was good to see you.  For your ear- that bump looks like a mole or a skin tag, but I do not think this is affecting your hearing.  I will make a referral for you to see Audiology for hearing tests.  The office will contact you with your appointment.   Your wet prep today was normal, no sign of Bacterial Vaginosis.  I will let you know when I receive the gonorrhea and chlamydia tests back.

## 2011-12-31 ENCOUNTER — Other Ambulatory Visit: Payer: Self-pay | Admitting: Family Medicine

## 2011-12-31 ENCOUNTER — Encounter: Payer: Self-pay | Admitting: Family Medicine

## 2012-01-05 ENCOUNTER — Ambulatory Visit: Payer: Self-pay

## 2012-01-06 ENCOUNTER — Telehealth: Payer: Self-pay | Admitting: *Deleted

## 2012-01-06 NOTE — Telephone Encounter (Signed)
Patient calling because she received letter from Dr. Lula Olszewski to take metronidazole for 7 days.  Patient checked with [facility] on [address] and they did not have Rx.  Per Epic---med was e-rx'd by Dr. Denyse Amass on 12/25/11.  Called and informed [facility] of metronidazole Rx.  Patient will check with pharmacy later today.  Gaylene Brooks, RN

## 2012-01-16 ENCOUNTER — Encounter (HOSPITAL_COMMUNITY): Payer: Self-pay | Admitting: Emergency Medicine

## 2012-01-16 ENCOUNTER — Emergency Department (HOSPITAL_COMMUNITY): Payer: Self-pay

## 2012-01-16 ENCOUNTER — Emergency Department (HOSPITAL_COMMUNITY)
Admission: EM | Admit: 2012-01-16 | Discharge: 2012-01-16 | Disposition: A | Discharge status: Home / Self Care | Payer: Self-pay | Attending: Emergency Medicine | Admitting: Emergency Medicine

## 2012-01-16 DIAGNOSIS — S66819A Strain of other specified muscles, fascia and tendons at wrist and hand level, unspecified hand, initial encounter: Secondary | ICD-10-CM | POA: Insufficient documentation

## 2012-01-16 DIAGNOSIS — S63599A Other specified sprain of unspecified wrist, initial encounter: Secondary | ICD-10-CM | POA: Insufficient documentation

## 2012-01-16 DIAGNOSIS — K219 Gastro-esophageal reflux disease without esophagitis: Secondary | ICD-10-CM | POA: Insufficient documentation

## 2012-01-16 DIAGNOSIS — IMO0002 Reserved for concepts with insufficient information to code with codable children: Secondary | ICD-10-CM | POA: Insufficient documentation

## 2012-01-16 DIAGNOSIS — W010XXA Fall on same level from slipping, tripping and stumbling without subsequent striking against object, initial encounter: Secondary | ICD-10-CM | POA: Insufficient documentation

## 2012-01-16 DIAGNOSIS — I10 Essential (primary) hypertension: Secondary | ICD-10-CM | POA: Insufficient documentation

## 2012-01-16 DIAGNOSIS — S6390XA Sprain of unspecified part of unspecified wrist and hand, initial encounter: Secondary | ICD-10-CM

## 2012-01-16 DIAGNOSIS — S63509A Unspecified sprain of unspecified wrist, initial encounter: Secondary | ICD-10-CM

## 2012-01-16 DIAGNOSIS — Z87891 Personal history of nicotine dependence: Secondary | ICD-10-CM | POA: Insufficient documentation

## 2012-01-16 MED ORDER — OXYCODONE-ACETAMINOPHEN 5-325 MG PO TABS
2.0000 | ORAL_TABLET | Freq: Once | ORAL | Status: AC
  Administered 2012-01-16: 2 via ORAL
  Filled 2012-01-16: qty 2
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
## 2012-01-16 NOTE — ED Notes (Signed)
Patient is alert and oriented x3.  She was given DC instructions and follow up visit instructions.  Patient gave verbal understanding. She was DC ambulatory under his own power to home.  V/S stable.  He was not showing any signs of distress on DC 
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
## 2012-01-21 ENCOUNTER — Ambulatory Visit: Payer: Self-pay | Attending: Family Medicine | Admitting: Audiology

## 2012-01-21 ENCOUNTER — Ambulatory Visit (INDEPENDENT_AMBULATORY_CARE_PROVIDER_SITE_OTHER): Payer: Self-pay | Admitting: Family Medicine

## 2012-01-21 ENCOUNTER — Encounter: Payer: Self-pay | Admitting: Family Medicine

## 2012-01-21 VITALS — BP 138/85 | HR 85 | Temp 98.0°F | Ht 67.0 in | Wt 241.0 lb

## 2012-01-21 DIAGNOSIS — M79609 Pain in unspecified limb: Secondary | ICD-10-CM

## 2012-01-21 DIAGNOSIS — H905 Unspecified sensorineural hearing loss: Secondary | ICD-10-CM | POA: Insufficient documentation

## 2012-01-21 DIAGNOSIS — M79642 Pain in left hand: Secondary | ICD-10-CM

## 2012-01-21 MED ORDER — DICLOFENAC SODIUM 3 % TD GEL: 1.0000 "application " | Freq: Three times a day (TID) | TRANSDERMAL | Status: DC
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
## 2012-01-21 NOTE — Patient Instructions (Addendum)
Norma [name],  Thank you for coming in to see me today,  Please continue ice, elevation, take vicodin three times daily as prescribed. Pick up and use diclofenac gel three tmes daily. Go for x-ray if needed based on x-ray I will refer to sports medicine.   F/u in one week.  Dr. Armen Pickup

## 2012-01-22 NOTE — ED Provider Notes (Signed)
Medical screening examination/treatment/procedure(s) were performed by non-physician practitioner and as supervising physician I was immediately available for consultation/collaboration.  Jones Skene, M.D.     Jones Skene, MD 01/22/12 0900

## 2012-01-23 NOTE — Assessment & Plan Note (Signed)
A: pain 2/2 to fall. Most likely contusion. Will obtain repeat x-ray to rule out fracture.  P: Please continue ice, elevation, take vicodin three times daily as prescribed. Pick up and use diclofenac gel three tmes daily. Go for repeat  x-ray if needed based on x-ray I will refer to sports medicine.

## 2012-01-24 ENCOUNTER — Ambulatory Visit (HOSPITAL_COMMUNITY)
Admission: RE | Admit: 2012-01-24 | Discharge: 2012-01-24 | Disposition: A | Discharge status: Home / Self Care | Payer: Self-pay | Source: Ambulatory Visit | Attending: Family Medicine | Admitting: Family Medicine

## 2012-01-24 DIAGNOSIS — M79642 Pain in left hand: Secondary | ICD-10-CM

## 2012-01-24 DIAGNOSIS — M79609 Pain in unspecified limb: Secondary | ICD-10-CM | POA: Insufficient documentation

## 2012-01-25 ENCOUNTER — Telehealth: Payer: Self-pay | Admitting: Family Medicine

## 2012-01-25 ENCOUNTER — Encounter: Payer: Self-pay | Admitting: Family Medicine

## 2012-01-25 NOTE — Telephone Encounter (Signed)
Pt is asking for results of her xray of her hand - still hurts a lot

## 2012-01-25 NOTE — Telephone Encounter (Signed)
Pt informed. Norma [name] Dawn  

## 2012-01-31 ENCOUNTER — Encounter: Payer: Self-pay | Admitting: Family Medicine

## 2012-01-31 ENCOUNTER — Ambulatory Visit (INDEPENDENT_AMBULATORY_CARE_PROVIDER_SITE_OTHER): Payer: Self-pay | Admitting: Family Medicine

## 2012-01-31 VITALS — BP 147/92 | HR 73 | Temp 97.6°F | Ht 67.0 in | Wt 246.0 lb

## 2012-01-31 DIAGNOSIS — Z23 Encounter for immunization: Secondary | ICD-10-CM

## 2012-01-31 DIAGNOSIS — M79609 Pain in unspecified limb: Secondary | ICD-10-CM

## 2012-01-31 DIAGNOSIS — M25519 Pain in unspecified shoulder: Secondary | ICD-10-CM

## 2012-01-31 DIAGNOSIS — M79642 Pain in left hand: Secondary | ICD-10-CM

## 2012-01-31 DIAGNOSIS — M25512 Pain in left shoulder: Secondary | ICD-10-CM

## 2012-01-31 MED ORDER — HYDROCODONE-ACETAMINOPHEN 7.5-500 MG PO TABS: 1.0000 | ORAL_TABLET | Freq: Three times a day (TID) | ORAL | Status: DC | PRN

## 2012-01-31 NOTE — Progress Notes (Signed)
Subjective:     Patient ID: Norma [name], female   DOB: [birth year], 51 y.o.   MRN: [id number]  HPI 51 yo F presents for f/u to discuss the following:  1. L hand pain: improved. Still hurts with heaving lifting. Taking it easy at work. Wearing wrist brace.   2. L lateral shoulder pain: x 2 weeks. Achy at night. No new injury. History of pain in  This shoulder responsive to a steroid injection in the past. Denies fever.   Review of Systems As per HPI     Objective:   Physical Exam BP 147/92  Pulse 73  Temp 97.6 F (36.4 C) (Oral)  Ht 5\' 7"  (1.702 m)  Wt 246 lb (111.585 kg)  BMI 38.53 kg/m2 General appearance: alert, cooperative and no distress Shoulder: Inspection reveals no abnormalities, atrophy or asymmetry. Palpation is normal with no tenderness over AC joint or bicipital groove. ROM is full in all planes. Rotator cuff strength is limited pain on abduction against resistance.  No signs of impingement with negative Neer and Hawkin's tests, empty can. Speeds and Yergason's tests normal. No labral pathology noted with negative Obrien's, negative clunk and good stability. Normal scapular function observed. No painful arc and no drop arm sign. No apprehension sign  A steroid injection was performed at L shoulder  using  3 ml of 1% plain Lidocaine and 1 ml of 80 mg of Kenalog. This was well tolerated.     Assessment and Plan:

## 2012-01-31 NOTE — Patient Instructions (Addendum)
Ivory,  Thank you for coming in today.   For your shoulder, ice it tonight and take it easy on it.   For hand, x-ray negative. Continue to ice and elevate. Decrease activity at work until improved.   Dr. Armen Pickup

## 2012-01-31 NOTE — Assessment & Plan Note (Signed)
A: pain improving. No fracture on f/u x-ray P: Continue rest, ice, elevation.

## 2012-01-31 NOTE — Assessment & Plan Note (Signed)
A: recurrent problem. Joint not imaged previously. P; Steroid injection today, anterior approach. Plan to image shoulder if pain persist.

## 2012-02-01 ENCOUNTER — Telehealth: Payer: Self-pay | Admitting: Family Medicine

## 2012-02-01 DIAGNOSIS — M79642 Pain in left hand: Secondary | ICD-10-CM

## 2012-02-01 NOTE — Telephone Encounter (Signed)
Pt is asking 7.5/750 hydrocodone - that is the only strength that the orange card will pay for She had to go ahead and fill one of the scripts but she is bringing back the other 2

## 2012-02-01 NOTE — Telephone Encounter (Signed)
Patient brought back scripts.  She said that the strength was cut and that it is not covered under the orange card.

## 2012-02-01 NOTE — Telephone Encounter (Signed)
Shredded scripts that she brought back.

## 2012-02-02 MED ORDER — HYDROCODONE-ACETAMINOPHEN 7.5-500 MG PO TABS: 1.0000 | ORAL_TABLET | Freq: Four times a day (QID) | ORAL | Status: DC | PRN

## 2012-02-02 MED ORDER — DICLOFENAC SODIUM 3 % TD GEL: 1.0000 "application " | Freq: Three times a day (TID) | TRANSDERMAL | Status: DC

## 2012-02-02 NOTE — Telephone Encounter (Signed)
Called [facility] to confirm that the 7.5/750s are the only one's covered and they are.  Refills ordered with previous dose and placed up front to be picked up.

## 2012-02-02 NOTE — Telephone Encounter (Signed)
Called patient. Left VM. Scripts up front and ready for pick up.

## 2012-02-03 ENCOUNTER — Other Ambulatory Visit: Payer: Self-pay | Admitting: Family Medicine

## 2012-02-03 DIAGNOSIS — M79642 Pain in left hand: Secondary | ICD-10-CM

## 2012-02-03 MED ORDER — DICLOFENAC SODIUM 1 % TD GEL: 2.0000 g | Freq: Four times a day (QID) | TRANSDERMAL | Status: DC

## 2012-02-14 ENCOUNTER — Other Ambulatory Visit: Payer: Self-pay | Admitting: *Deleted

## 2012-02-15 MED ORDER — TRAZODONE HCL 100 MG PO TABS: 200.0000 mg | ORAL_TABLET | Freq: Every day | ORAL | Status: DC

## 2012-03-02 ENCOUNTER — Other Ambulatory Visit: Payer: Self-pay | Admitting: Family Medicine

## 2012-03-02 MED ORDER — HYDROCODONE-ACETAMINOPHEN 7.5-750 MG PO TABS: 1.0000 | ORAL_TABLET | Freq: Three times a day (TID) | ORAL | Status: DC | PRN

## 2012-03-08 ENCOUNTER — Telehealth: Payer: Self-pay | Admitting: Family Medicine

## 2012-03-08 NOTE — Telephone Encounter (Signed)
Received call from patient that she was having cramps in feet and calves also some mild cramps in lower back.  She is taking otc potassium supplement.  Advised to try ibuprofen and follow up with fpc in am if still bothering her.

## 2012-03-09 ENCOUNTER — Ambulatory Visit (HOSPITAL_COMMUNITY)
Admission: RE | Admit: 2012-03-09 | Discharge: 2012-03-09 | Disposition: A | Discharge status: Home / Self Care | Payer: Self-pay | Source: Ambulatory Visit | Attending: Family Medicine | Admitting: Family Medicine

## 2012-03-09 ENCOUNTER — Ambulatory Visit (INDEPENDENT_AMBULATORY_CARE_PROVIDER_SITE_OTHER): Payer: Self-pay | Admitting: Family Medicine

## 2012-03-09 ENCOUNTER — Encounter: Payer: Self-pay | Admitting: Family Medicine

## 2012-03-09 VITALS — BP 129/89 | HR 87 | Temp 98.1°F | Ht 67.0 in | Wt 240.0 lb

## 2012-03-09 DIAGNOSIS — Z1231 Encounter for screening mammogram for malignant neoplasm of breast: Secondary | ICD-10-CM | POA: Insufficient documentation

## 2012-03-09 DIAGNOSIS — R252 Cramp and spasm: Secondary | ICD-10-CM

## 2012-03-09 DIAGNOSIS — R109 Unspecified abdominal pain: Secondary | ICD-10-CM | POA: Insufficient documentation

## 2012-03-09 LAB — CBC
MCH: 30.1 pg (ref 26.0–34.0)
MCV: 87.6 fL (ref 78.0–100.0)
Platelets: 239 10*3/uL (ref 150–400)
RBC: 4.92 MIL/uL (ref 3.87–5.11)
RDW: 14.3 % (ref 11.5–15.5)

## 2012-03-09 MED ORDER — POLYETHYLENE GLYCOL 3350 17 GM/SCOOP PO POWD: 17.0000 g | Freq: Every day | ORAL | Status: DC | PRN

## 2012-03-09 NOTE — Assessment & Plan Note (Signed)
Reported muscle cramps of legs and around ribs. Unsure if these complaints are truly related. She does give good explanation of cramping though. Will check CBC, CMet (for K+, LFT and calcium in particular), Magnesium, TSH and CK today. Patient will follow up in sports med tomorrow, then again with PCP next week. For now, continue to take ibuprofen as needed for pain as well as chronic pain medications. She can take hot showers or baths for relief. No new medications prescribed.

## 2012-03-09 NOTE — Patient Instructions (Signed)
It was nice to meet you today!  Lets check some labs on you today to see if there is anything going on that could be causing your muscle cramps. Continue taking the medications you already have, and you can use heat as well. Please make a follow up appointment with Dr. Armen Pickup early next week.  Take care, Norma [name] M. Norma [name], M.D.

## 2012-03-09 NOTE — Progress Notes (Signed)
Patient ID: Norma [name], female   DOB: [birth year], 51 y.o.   MRN: [id number] [name] Norma [name]. Norma Goldenstein, MD Phone: [phone]   Subjective: HPI: Patient is a 51 y.o. female presenting to clinic today for same day appointment for cramping all over. Started in legs and feet 2 weeks ago. Then a few days ago started feeling cramps all over. Feels like muscles get tight. Described that her toes are curled up. She states she had cramps a while back and took potassium and it resolved. This time took potassium and didn't help. Called emergency line, advised to take Ibuprofen as well which didn't help. Concerned yesterday because the pain was in her ribs and she felt it may be cardiac. Difficulty sleeping, afraid to drive because of the pain. Not able to work because of the pain.   Only new medications are a hair nutrition vitamin and detox pill, but started these AFTER her leg cramps started  Has HA (taking longer to resolve), congestion (grandchildren are sick), no stabbing chest pain, some SOB, no abd pain, no dysuria, no rashes.  History Reviewed: Non-smoker. Health Maintenance: UTD - had flu shot this year.   ROS: Please see HPI above.  Objective: Office vital signs reviewed.  Physical Examination:  General: Awake, alert. No distress at all HEENT: Atraumatic, normocephalic Neck: No masses palpated. No LAD Pulm: CTAB, no wheezes Cardio: RRR, no murmurs appreciated Abdomen: soft, nontender, nondistended MSK: Able to ambulate and get on exam table with little difficulty. No TTP of spine, or paraspinal muscles. TTP of LEFT shoulder, as well as LEFT wrist (brace in place) but not to muscles. No TTP of chest or ribs. No edema, erythema of any extremity.  Neuro: Grossly intact. Left grip slightly decreased. No weakness of lower extremities  Assessment: 51 yo F presenting with muscle cramping  Plan: See Problem List and After Visit Summary

## 2012-03-10 ENCOUNTER — Telehealth: Payer: Self-pay | Admitting: Family Medicine

## 2012-03-10 ENCOUNTER — Ambulatory Visit (INDEPENDENT_AMBULATORY_CARE_PROVIDER_SITE_OTHER): Payer: Self-pay | Admitting: Family Medicine

## 2012-03-10 ENCOUNTER — Encounter: Payer: Self-pay | Admitting: Family Medicine

## 2012-03-10 VITALS — BP 112/82 | HR 88 | Ht 67.0 in | Wt 240.0 lb

## 2012-03-10 DIAGNOSIS — M25519 Pain in unspecified shoulder: Secondary | ICD-10-CM

## 2012-03-10 DIAGNOSIS — M25512 Pain in left shoulder: Secondary | ICD-10-CM

## 2012-03-10 LAB — CK: Total CK: 426 U/L — ABNORMAL HIGH (ref 7–177)

## 2012-03-10 LAB — COMPREHENSIVE METABOLIC PANEL
ALT: 22 U/L (ref 0–35)
BUN: 16 mg/dL (ref 6–23)
CO2: 31 mEq/L (ref 19–32)
Creat: 0.98 mg/dL (ref 0.50–1.10)
Total Bilirubin: 0.8 mg/dL (ref 0.3–1.2)

## 2012-03-10 LAB — MAGNESIUM: Magnesium: 1.9 mg/dL (ref 1.5–2.5)

## 2012-03-10 LAB — VITAMIN D 25 HYDROXY (VIT D DEFICIENCY, FRACTURES): Vit D, 25-Hydroxy: 20 ng/mL — ABNORMAL LOW (ref 30–89)

## 2012-03-10 NOTE — Progress Notes (Signed)
  Subjective:    Patient ID: Norma [name], female    DOB: 1961/02/12, 51 y.o.   MRN: [id number]  HPI  Left shoulder pain. Had an injection about 6 weeks ago it did not seem to help much. Previously, several years ago she had an injection in either her left or right shoulder, she cannot room her which, at which seemed to really help. Her shoulder pain bothers her mostly at night. It also bothers her when she standing for long periods of time and her arm is hanging down. If she's carrying something heavy such as a person on that side, it hurts worse. Right-hand dominant. No history of specific injury. Review of Systems She denies numbness or tingling in the fingers.    Objective:   Physical Exam Vital signs are reviewed GENERAL: Well-developed overweight female no acute distress SHOULDER: Left. Full range of motion. Bicipital tendon is nontender. Rotator cuff strength is intact in all planes. She does have some pain with supraspinatus testing. Very mild pain with deep lift off test. Distally neurovascularly intact. Positive impingement. INJECTION: Patient was given informed consent, signed copy in the chart. Appropriate time out was taken. Area prepped and draped in usual sterile fashion. One cc of methylprednisolone 40 mg/ml plus  4 cc of 1% lidocaine without epinephrine was injected into the left subacromial bursa using a(n) and posterior approach. The patient tolerated the procedure well. There were no complications. Post procedure instructions were given.        Assessment & Plan:  Her one. Left shoulder pain consistent with subacromial bursitis. Unclear why the other injection did not seem to help. Her symptoms are a little odd in that she has pain with prolonged upright posture or with carrying a person that hand. This would be more consistent with glenohumeral issues. She definitely has signs of impingement doses today we discussed and decided to do a second subacromial injection. If  this does not work, then I would have her back, do ultrasound and potentially do a glenohumeral corticosteroid injection or consider further imaging.

## 2012-03-10 NOTE — Telephone Encounter (Signed)
Patient is calling for her lab results.  

## 2012-03-10 NOTE — Telephone Encounter (Signed)
Will forward to last MD seen and PCP. 

## 2012-03-10 NOTE — Telephone Encounter (Signed)
Please let patient know that her electrolytes and blood counts were all normal. TSH is unchanged from last check. Her CK (muscle enzyme) is slightly elevated, but not to the level of being harmful. She should drink plenty of water and continue to use heat and Tylenol for cramps until she sees Dr. Armen Pickup next week.  Thank you! Keiva Dina M. Nester Bachus, M.D.

## 2012-03-13 NOTE — Telephone Encounter (Signed)
Patient would like to speak to the nurse about her lab results

## 2012-03-13 NOTE — Telephone Encounter (Signed)
Patient informed of message from MD, expressed understanding. 

## 2012-03-14 ENCOUNTER — Ambulatory Visit: Payer: Self-pay | Admitting: Family Medicine

## 2012-03-17 ENCOUNTER — Encounter (HOSPITAL_COMMUNITY): Payer: Self-pay | Admitting: *Deleted

## 2012-03-17 ENCOUNTER — Emergency Department (HOSPITAL_COMMUNITY)
Admission: EM | Admit: 2012-03-17 | Discharge: 2012-03-17 | Disposition: A | Discharge status: Home / Self Care | Payer: Self-pay | Attending: Emergency Medicine | Admitting: Emergency Medicine

## 2012-03-17 DIAGNOSIS — I10 Essential (primary) hypertension: Secondary | ICD-10-CM | POA: Insufficient documentation

## 2012-03-17 DIAGNOSIS — M545 Low back pain, unspecified: Secondary | ICD-10-CM | POA: Insufficient documentation

## 2012-03-17 DIAGNOSIS — Z79899 Other long term (current) drug therapy: Secondary | ICD-10-CM | POA: Insufficient documentation

## 2012-03-17 DIAGNOSIS — Z8739 Personal history of other diseases of the musculoskeletal system and connective tissue: Secondary | ICD-10-CM | POA: Insufficient documentation

## 2012-03-17 DIAGNOSIS — Z8719 Personal history of other diseases of the digestive system: Secondary | ICD-10-CM | POA: Insufficient documentation

## 2012-03-17 DIAGNOSIS — R109 Unspecified abdominal pain: Secondary | ICD-10-CM | POA: Insufficient documentation

## 2012-03-17 DIAGNOSIS — M549 Dorsalgia, unspecified: Secondary | ICD-10-CM

## 2012-03-17 HISTORY — DX: Dorsalgia, unspecified: M54.9

## 2012-03-17 LAB — URINALYSIS, ROUTINE W REFLEX MICROSCOPIC
Ketones, ur: NEGATIVE mg/dL
Nitrite: NEGATIVE
Protein, ur: NEGATIVE mg/dL
Urobilinogen, UA: 1 mg/dL (ref 0.0–1.0)
pH: 5.5 (ref 5.0–8.0)

## 2012-03-17 LAB — CBC WITH DIFFERENTIAL/PLATELET
Basophils Absolute: 0 10*3/uL (ref 0.0–0.1)
HCT: 38.6 % (ref 36.0–46.0)
Lymphocytes Relative: 46 % (ref 12–46)
Neutro Abs: 2.7 10*3/uL (ref 1.7–7.7)
Neutrophils Relative %: 43 % (ref 43–77)
Platelets: 196 10*3/uL (ref 150–400)
RDW: 12.9 % (ref 11.5–15.5)
WBC: 6.2 10*3/uL (ref 4.0–10.5)

## 2012-03-17 LAB — URINE MICROSCOPIC-ADD ON

## 2012-03-17 LAB — BASIC METABOLIC PANEL
CO2: 28 mEq/L (ref 19–32)
Chloride: 100 mEq/L (ref 96–112)
GFR calc Af Amer: 89 mL/min — ABNORMAL LOW (ref >90)
Sodium: 137 mEq/L (ref 135–145)

## 2012-03-17 MED ORDER — ONDANSETRON 4 MG PO TBDP
4.0000 mg | ORAL_TABLET | Freq: Once | ORAL | Status: AC | PRN
  Administered 2012-03-17: 4 mg via ORAL
  Filled 2012-03-17: qty 1

## 2012-03-17 MED ORDER — HYDROMORPHONE HCL PF 2 MG/ML IJ SOLN
2.0000 mg | Freq: Once | INTRAMUSCULAR | Status: AC
  Administered 2012-03-17: 2 mg via INTRAMUSCULAR
  Filled 2012-03-17: qty 1

## 2012-03-17 NOTE — ED Notes (Signed)
Lower back pain and rt flank pain for 2 days, no history of kidney stones

## 2012-03-17 NOTE — ED Provider Notes (Signed)
History     CSN: [id number]  Arrival date & time 03/17/12  1022   First MD Initiated Contact with Patient 03/17/12 1033      Chief Complaint  Patient presents with  . Flank Pain  . Back Pain    (Consider location/radiation/quality/duration/timing/severity/associated sxs/prior treatment) HPI Pt started having some muscle cramps a few days ago.  She saw a doctor and was told try mustard and drink fluids.  That has gotten better but she started to have more pain in her  Lower back yesterday.  Today it has persisted.  It hurts to walk, move and turn.  The pain was more intense today so she came to the ED.  No burning with urination.  No fever, or vomiting.  No abdominal pain.  She takes medications for back pain usually. Past Medical History  Diagnosis Date  . Hypertension   . Acid reflux   . Tachyarrhythmia   . Left shoulder pain   . Back pain     History reviewed. No pertinent past surgical history.  No family history on file.  History  Substance Use Topics  . Smoking status: Never Smoker   . Smokeless tobacco: Never Used  . Alcohol Use: Yes     Comment: occasionally    OB History    Grav Para Term Preterm Abortions TAB SAB Ect Mult Living                  Review of Systems  All other systems reviewed and are negative.    Allergies  Nortriptyline and Prednisone  Home Medications   Current Outpatient Rx  Name  Route  Sig  Dispense  Refill  . CITALOPRAM HYDROBROMIDE 40 MG PO TABS   Oral   Take 1 tablet (40 mg total) by mouth daily. (mental health)   90 tablet   2   . DICLOFENAC SODIUM 1 % TD GEL   Topical   Apply 2 g topically 4 (four) times daily. To affected area of L hand.   100 g   0   . DILTIAZEM HCL 90 MG PO TABS   Oral   Take 1 tablet (90 mg total) by mouth 2 (two) times daily.   180 tablet   3   . FLUTICASONE PROPIONATE 50 MCG/ACT NA SUSP   Nasal   Place 2 sprays into the nose daily as needed.          Marland Kitchen HYDROCHLOROTHIAZIDE 25 MG  PO TABS   Oral   Take 1 tablet (25 mg total) by mouth daily.   90 tablet   3   . HYDROCODONE-ACETAMINOPHEN 7.5-750 MG PO TABS   Oral   Take 1 tablet by mouth every 8 (eight) hours as needed for pain.   90 tablet   0   . HYDROCODONE-ACETAMINOPHEN 7.5-750 MG PO TABS   Oral   Take 1 tablet by mouth every 8 (eight) hours as needed for pain.   90 tablet   0     Fill 30 days later.   . MELOXICAM 15 MG PO TABS   Oral   Take 15 mg by mouth daily as needed.         . NON FORMULARY   Oral   Take 1 tablet by mouth daily. I-cool         . OVER THE COUNTER MEDICATION   Both Eyes   Place 1 drop into both eyes daily. Moisturizing eye drop/redness         .  POLYETHYLENE GLYCOL 3350 PO POWD   Oral   Take 17 g by mouth daily as needed.   255 g   2   . TRAZODONE HCL 100 MG PO TABS   Oral   Take 2 tablets (200 mg total) by mouth at bedtime.   60 tablet   5   . TRIAMCINOLONE ACETONIDE 0.1 % EX CREA   Topical   Apply topically 2 (two) times daily.   454 g   0     BP 131/100  Pulse 79  Temp 97.7 F (36.5 C) (Oral)  Resp 18  SpO2 100%  Physical Exam  Nursing note and vitals reviewed. Constitutional: She appears well-developed and well-nourished. No distress.       Obese   HENT:  Head: Normocephalic and atraumatic.  Right Ear: External ear normal.  Left Ear: External ear normal.  Nose: Nose normal.  Eyes: Conjunctivae normal and EOM are normal.  Neck: Neck supple. No tracheal deviation present.  Cardiovascular: Normal rate, regular rhythm and normal heart sounds.   Pulmonary/Chest: Effort normal and breath sounds normal. No stridor. No respiratory distress. She has no wheezes.  Abdominal: Soft. She exhibits no distension. There is no tenderness. There is no rebound.  Musculoskeletal: She exhibits no edema and no tenderness.       Lumbar back: She exhibits tenderness and pain. She exhibits no bony tenderness, no swelling, no edema and no spasm.       Negative  pain with straight leg raise  Neurological: She is alert. She is not disoriented. No cranial nerve deficit or sensory deficit. She exhibits normal muscle tone. Coordination normal.  Reflex Scores:      Patellar reflexes are 2+ on the right side and 2+ on the left side.      Achilles reflexes are 2+ on the right side and 2+ on the left side. Skin: Skin is warm and dry. No rash noted. She is not diaphoretic. No erythema.  Psychiatric: She has a normal mood and affect. Her behavior is normal. Thought content normal.    ED Course  Procedures (including critical care time)  Labs Reviewed  URINALYSIS, ROUTINE W REFLEX MICROSCOPIC - Abnormal; Notable for the following:    Color, Urine AMBER (*)  BIOCHEMICALS MAY BE AFFECTED BY COLOR   APPearance TURBID (*)     Specific Gravity, Urine 1.031 (*)     Leukocytes, UA SMALL (*)     All other components within normal limits  BASIC METABOLIC PANEL - Abnormal; Notable for the following:    Glucose, Bld 110 (*)     GFR calc non Af Amer 77 (*)     GFR calc Af Amer 89 (*)     All other components within normal limits  URINE MICROSCOPIC-ADD ON - Abnormal; Notable for the following:    Squamous Epithelial / LPF FEW (*)     Bacteria, UA FEW (*)     All other components within normal limits  CBC WITH DIFFERENTIAL   No results found.   No diagnosis found.    MDM  Back pain Doubt uti or renal colic.  Likely musculoskeletal.  She does have medications at home.  Rec follow up with PCP if symptoms persist next week.  At this time there does not appear to be any evidence of an acute emergency medical condition and the patient appears stable for discharge with appropriate outpatient follow up.         Celene Kras, MD  03/17/12 1206 

## 2012-03-21 ENCOUNTER — Encounter: Payer: Self-pay | Admitting: Family Medicine

## 2012-03-21 ENCOUNTER — Ambulatory Visit (INDEPENDENT_AMBULATORY_CARE_PROVIDER_SITE_OTHER): Payer: No Typology Code available for payment source | Admitting: Family Medicine

## 2012-03-21 VITALS — BP 138/80 | HR 87 | Temp 98.2°F | Ht 67.0 in | Wt 243.0 lb

## 2012-03-21 DIAGNOSIS — M549 Dorsalgia, unspecified: Secondary | ICD-10-CM

## 2012-03-21 DIAGNOSIS — M546 Pain in thoracic spine: Secondary | ICD-10-CM

## 2012-03-21 DIAGNOSIS — L709 Acne, unspecified: Secondary | ICD-10-CM | POA: Insufficient documentation

## 2012-03-21 DIAGNOSIS — L708 Other acne: Secondary | ICD-10-CM

## 2012-03-21 MED ORDER — ADAPALENE 0.1 % EX GEL: Freq: Every day | CUTANEOUS | Status: DC

## 2012-03-21 NOTE — Patient Instructions (Addendum)
Norma [name],  Thank you for coming in today.    I have sent Differin gel for you face, use nightly.   For you back, heat is good ok to take NSAID 1-2 tabs up to twice daily for short course 2 weeks.  I will send letter for gym scholarship to you.   F/u in 4 weeks or sooner if needed.   Dr. Armen Pickup

## 2012-03-21 NOTE — Progress Notes (Signed)
Subjective:     Patient ID: Norma [name], female   DOB: 1960/09/21, 51 y.o.   MRN: [id number]  HPI 51 yo F presents for same day visit to discuss the following:  1. R sided back pain: R flank. Went to ED on 11/29 no UTI. Pain started on 11/29. Non radiating. Denies fever, dysuria, N/V.   2. Acne: cheeks. Used a cream that worked well in the past but does not recall what. Rash returned one week ago.   3. Weight loss: dieting and taking weight loss supplement. Would like to start working out. Not exercising now due to back pain. Would like a membership at the [facility] but concerned about cost.   Review of Systems As per HPI     Objective:   Physical Exam BP 138/80  Pulse 87  Temp 98.2 F (36.8 C) (Oral)  Ht 5\' 7"  (1.702 m)  Wt 243 lb (110.224 kg)  BMI 38.06 kg/m2 General appearance: alert, cooperative, no distress and mildly obese Back: symmetric, no curvature. ROM normal. No CVA tenderness. R MSK pain over CVA with palpable trigger point. Negative straight leg raising test.  Skin: red raised papule on bilateral malar.   After obtaining informed consent and marking the site a steroid injection was performed at R latissimus dorsi trigger point  using 2:1 2% plain Lidocaine and 40 mg of Depo Medrol.  Injection done using sterile technique. This was well tolerated.    Assessment and Plan:

## 2012-03-22 DIAGNOSIS — M546 Pain in thoracic spine: Secondary | ICD-10-CM

## 2012-03-22 HISTORY — DX: Pain in thoracic spine: M54.6

## 2012-03-22 NOTE — Assessment & Plan Note (Signed)
A: non radicular back pain.  P: trigger point injection tolerated well with partial relief of pain.

## 2012-03-22 NOTE — Assessment & Plan Note (Signed)
A: acne. No scars. P: differin with daily sunscreen moisturizer.

## 2012-04-17 ENCOUNTER — Telehealth: Payer: Self-pay | Admitting: Family Medicine

## 2012-04-17 NOTE — Telephone Encounter (Signed)
Will forward to Dr. Funches 

## 2012-04-17 NOTE — Telephone Encounter (Signed)
Need hydrocodone refilled.

## 2012-04-24 ENCOUNTER — Ambulatory Visit (INDEPENDENT_AMBULATORY_CARE_PROVIDER_SITE_OTHER): Payer: No Typology Code available for payment source | Admitting: Family Medicine

## 2012-04-24 VITALS — BP 143/87 | Ht 67.0 in | Wt 243.0 lb

## 2012-04-24 DIAGNOSIS — M25519 Pain in unspecified shoulder: Secondary | ICD-10-CM

## 2012-04-24 NOTE — Patient Instructions (Addendum)
You have been scheduled for an appointment for MRI at [facility] on Thursday 04/27/12.  Please arrive at 1st floor radiology at 3:45 pm.   Thank you for seeing [place] today!

## 2012-04-25 NOTE — Progress Notes (Signed)
Patient ID: Norma [name], female   DOB: 1960-11-02, 52 y.o.   MRN: [id number] Contiued B L>R shoulder pain,. She is frustrated. The CSI made no difference --or maybe only 1-2 days of improvement. She wants  B shoulder pain. Will agree to MRI left shoulder as [place] is not useful given habitus. both shoulders evaluated by MRI. She is tired of dealing with pain. 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 OBJ> B shoulders have normal forward flexion. Strength is intact in all planes of teh rotator cuff. Pain with impingement testing and with subscap testing (right0

## 2012-04-26 ENCOUNTER — Telehealth: Payer: Self-pay | Admitting: Family Medicine

## 2012-04-26 ENCOUNTER — Ambulatory Visit (INDEPENDENT_AMBULATORY_CARE_PROVIDER_SITE_OTHER): Payer: No Typology Code available for payment source | Admitting: Family Medicine

## 2012-04-26 ENCOUNTER — Encounter: Payer: Self-pay | Admitting: Family Medicine

## 2012-04-26 VITALS — BP 140/80 | HR 88 | Temp 98.3°F | Ht 67.0 in | Wt 245.0 lb

## 2012-04-26 DIAGNOSIS — R079 Chest pain, unspecified: Secondary | ICD-10-CM

## 2012-04-26 DIAGNOSIS — I10 Essential (primary) hypertension: Secondary | ICD-10-CM

## 2012-04-26 DIAGNOSIS — G894 Chronic pain syndrome: Secondary | ICD-10-CM

## 2012-04-26 DIAGNOSIS — R0789 Other chest pain: Secondary | ICD-10-CM | POA: Insufficient documentation

## 2012-04-26 MED ORDER — HYDROCODONE-ACETAMINOPHEN 7.5-750 MG PO TABS
1.0000 | ORAL_TABLET | Freq: Three times a day (TID) | ORAL | Status: DC | PRN
Start: 2012-04-26 — End: 2012-04-28

## 2012-04-26 MED ORDER — DILTIAZEM HCL ER COATED BEADS 120 MG PO CP24: 120.0000 mg | ORAL_CAPSULE | Freq: Every day | ORAL | Status: DC

## 2012-04-26 MED ORDER — HYDROCODONE-ACETAMINOPHEN 7.5-750 MG PO TABS: 1.0000 | ORAL_TABLET | Freq: Three times a day (TID) | ORAL | Status: DC | PRN

## 2012-04-26 MED ORDER — CITALOPRAM HYDROBROMIDE 40 MG PO TABS: 40.0000 mg | ORAL_TABLET | Freq: Every day | ORAL | Status: DC

## 2012-04-26 MED ORDER — GABAPENTIN 300 MG PO CAPS: 300.0000 mg | ORAL_CAPSULE | Freq: Three times a day (TID) | ORAL | Status: DC

## 2012-04-26 NOTE — Progress Notes (Signed)
Subjective:     Patient ID: Norma [name], female   DOB: August 17, 1960, 52 y.o.   MRN: [id number]  HPI 52 yo F presents for f/u visit to discuss the following:  1. Chest pains: L sided. Described as tightness. Associated with palpitations, SOB, dizziness, blurred vission occuring over the past month.  Symptoms occur with exertion. Patient also having cold symptoms of sore throat, stuffy nose and intermittent cough over the past 3 weeks. She is a non-smoker but works in close proximity with smokers. No chest pain currently. Denies LE edema and syncope.   2. R flank pain: resolved following trigger point rejection but has returned over the past week.denies new injury or heaving lifting.   3. L shoulder pain: chronic, followed by sports medicine. MRI ordered for evaluation as patient has had corticosteroid injection x 2 w/o pain relief.   4. Chronic low back pain: with radicular symptoms. Stable. Intermittent pain. Controlled with chronic vicodin. Patient interested in additional pain control medication.   Review of Systems As per HPI    Objective:   Physical Exam BP 140/80  Pulse 88  Temp 98.3 F (36.8 C) (Oral)  Ht 5\' 7"  (1.702 m)  Wt 245 lb (111.131 kg)  BMI 38.37 kg/m2 General appearance: alert, cooperative and no distress Head: Normocephalic, without obvious abnormality, atraumatic Eyes: conjunctivae/corneas clear. PERRL, EOM's intact. Fundi benign. Throat: lips, mucosa, and tongue normal; teeth and gums normal Back: symmetric, no curvature. ROM normal. No CVA tenderness. R flank tenderness with palpable trigger point not underlying maximal point of tenderness.  Lungs: clear to auscultation bilaterally Heart: regular rate and rhythm, S1, S2 normal, no murmur, click, rub or gallop in seated and supine position  Extremities: extremities normal, atraumatic, no cyanosis or edema Neurologic: Grossly normal     Assessment and Plan:

## 2012-04-26 NOTE — Assessment & Plan Note (Signed)
chronic pain: Refilled Vicodin for 3 month supply Start gabapentin nightly for first week if you tolerate this add on once daily for 7 days, then up to twice daily (every 8 hrs).  Stop muscle relaxer.

## 2012-04-26 NOTE — Assessment & Plan Note (Signed)
1. Chest tightness: may just be related to a cold but given that it has been going on for a good while I am concerned about your lung function. You heart exam is normal, but I will check cholesterol to fully assess your heart disease risk.  -Please schedule PFTs in the pharmacy clinic to help me determine if you will benefit from an inhaler. -direct LDL today

## 2012-04-26 NOTE — Patient Instructions (Addendum)
Norma [name],  Thank you for coming in today.  1. Chest tightness: may just be related to a cold but given that it has been going on for a good while I am concerned about your lung function. You heart exam is normal, but I will check cholesterol to fully assess your heart disease risk.  -Please schedule PFTs in the pharmacy clinic to help me determine if you will benefit from an inhaler. -direct LDL today   2. chronic pain: Refilled Vicodin for 3 month supply Start gabapentin nightly for first week if you tolerate this add on once daily for 7 days, then up to twice daily (every 8 hrs).  Stop muscle relaxer.   F/u in 1 month.   Dr. Armen Pickup

## 2012-04-26 NOTE — Telephone Encounter (Signed)
Left VM about change of dilt to 120 mg Xt q D from 90 mg IR BID to help with palpitations.

## 2012-04-26 NOTE — Assessment & Plan Note (Signed)
A: BP at goal with manual BP 140/80. No edema P: Continue HCTZ and dilt. Changed dilt from 90 BID to cardia XT 120 mg  q D to see if this will help with palpitations.

## 2012-04-26 NOTE — Telephone Encounter (Signed)
meds refilled at office visit

## 2012-04-27 ENCOUNTER — Ambulatory Visit (HOSPITAL_COMMUNITY)
Admission: RE | Admit: 2012-04-27 | Discharge: 2012-04-27 | Disposition: A | Discharge status: Home / Self Care | Payer: No Typology Code available for payment source | Source: Ambulatory Visit | Attending: Family Medicine | Admitting: Family Medicine

## 2012-04-27 DIAGNOSIS — M719 Bursopathy, unspecified: Secondary | ICD-10-CM | POA: Insufficient documentation

## 2012-04-27 DIAGNOSIS — X58XXXA Exposure to other specified factors, initial encounter: Secondary | ICD-10-CM | POA: Insufficient documentation

## 2012-04-27 DIAGNOSIS — M25519 Pain in unspecified shoulder: Secondary | ICD-10-CM | POA: Insufficient documentation

## 2012-04-27 DIAGNOSIS — S46819A Strain of other muscles, fascia and tendons at shoulder and upper arm level, unspecified arm, initial encounter: Secondary | ICD-10-CM | POA: Insufficient documentation

## 2012-04-27 DIAGNOSIS — M67919 Unspecified disorder of synovium and tendon, unspecified shoulder: Secondary | ICD-10-CM | POA: Insufficient documentation

## 2012-04-28 ENCOUNTER — Telehealth: Payer: Self-pay | Admitting: Family Medicine

## 2012-04-28 MED ORDER — HYDROCODONE-ACETAMINOPHEN 10-325 MG PO TABS: 1.0000 | ORAL_TABLET | Freq: Three times a day (TID) | ORAL | Status: DC | PRN

## 2012-04-28 NOTE — Telephone Encounter (Signed)
Norma [name] is calling because she went to get her pain meds and discovered that the hydrocodone with acetaminophen was no longer available for $4.00 at the mg she was receiving.  Need to change to 10/325 in order to get at $4.00.  Her gabapentin is also over the amount she is able to afford.  Need something else comparable to it for the $4.00 price.  And last the trazadone is costing more also.  She need all these to be corrected and sent in before closing today.  Call patient back if possible to inform what is taken care of.

## 2012-04-28 NOTE — Telephone Encounter (Signed)
[facility] also calling.  Received Rx for Vicodin ES 7.5/750 mg on 04/26/12.  This strength is no longer available due to the Tylenol component.  Can change to 325 mg and pharmacy also has some 500 mg tabs.  Will route to Dr. Armen Pickup and call patient and pharmacy back. Gaylene Brooks, RN

## 2012-04-28 NOTE — Telephone Encounter (Signed)
[facility]ed [facility] 1. Vicodin: 7.7/750 no longer on formulary. Formulary is 10/325 or 5/235.  Patient left rx at pharmacy [facility] to call in changes. Will speak with patient about change. I prefer 5/325.    Called Ms. Emery 1. Vicodin: too expensive. She prefers 10/325. In pain today. Unable to get gabapentin and trazadone due to cost at [facility].  Gabapentin $29/mos  Trazadone $25/mos  Patient has not renewed orange card so cannot get meds at [facility] program.  Called in change of vicodin to 10/325.  Will research options for gabapentin and trazadone.

## 2012-05-01 ENCOUNTER — Telehealth: Payer: Self-pay | Admitting: Family Medicine

## 2012-05-01 MED ORDER — TRAZODONE HCL 100 MG PO TABS: 100.0000 mg | ORAL_TABLET | Freq: Every day | ORAL | Status: DC

## 2012-05-01 MED ORDER — TRAZODONE HCL 150 MG PO TABS: 150.0000 mg | ORAL_TABLET | Freq: Every day | ORAL | Status: DC

## 2012-05-01 NOTE — Telephone Encounter (Signed)
Patient asked about cramping. She has symptoms during day and night. I am not sure what is causing symptoms.  It is worth rechecking K+. Patient has appt tomorrow.   Patient will have to re apply for MAP assistance for gabapentin

## 2012-05-01 NOTE — Telephone Encounter (Signed)
C/o feet and lower legs cramps.  Was seen for cramping "all over" back in November.  Was told to try mustard for muscle cramps.  Cramps have improved, but continue in feet and lower legs.  Had to "jump up and down all night" and was unable to sleep.  Patient declined work-in appt for this afternoon.  Scheduled appt with [facility] for tomorrow morning at 9:30 am.  Gaylene Brooks, RN

## 2012-05-01 NOTE — Telephone Encounter (Signed)
Patient would like to speak to the nurse about the cramps she is having in her legs and feet.

## 2012-05-01 NOTE — Addendum Note (Signed)
Addended by: Dessa Phi on: 05/01/2012 04:48 PM   Modules accepted: Orders

## 2012-05-01 NOTE — Telephone Encounter (Signed)
Called patient's [facility]. trazadone 150 mg is on $4 list.   Called patient to let her know.

## 2012-05-02 ENCOUNTER — Ambulatory Visit: Payer: No Typology Code available for payment source

## 2012-05-03 ENCOUNTER — Telehealth: Payer: Self-pay | Admitting: *Deleted

## 2012-05-03 NOTE — Telephone Encounter (Signed)
Spoke with pt, gave her MRI results.  Scheduled her for f/u appt with Dr. Jennette Kettle 05/05/12 per pt's request.

## 2012-05-03 NOTE — Telephone Encounter (Signed)
Message copied by Mora Bellman on Wed May 03, 2012  3:47 PM ------      Message from: CERESI, MELANIE L      Created: Mon May 01, 2012 10:27 AM      Regarding: phone message      Contact: [phone]       Pt would like results of Mri of left shoulder. Still having a lot of pain in shoulder. Had MRI done1/9.

## 2012-05-05 ENCOUNTER — Encounter: Payer: Self-pay | Admitting: Family Medicine

## 2012-05-05 ENCOUNTER — Ambulatory Visit (INDEPENDENT_AMBULATORY_CARE_PROVIDER_SITE_OTHER): Payer: No Typology Code available for payment source | Admitting: Family Medicine

## 2012-05-05 VITALS — BP 149/85 | HR 90 | Ht 67.0 in | Wt 245.0 lb

## 2012-05-05 DIAGNOSIS — S43429A Sprain of unspecified rotator cuff capsule, initial encounter: Secondary | ICD-10-CM

## 2012-05-05 DIAGNOSIS — M75102 Unspecified rotator cuff tear or rupture of left shoulder, not specified as traumatic: Secondary | ICD-10-CM | POA: Insufficient documentation

## 2012-05-05 MED ORDER — NITROGLYCERIN 0.2 MG/HR TD PT24: MEDICATED_PATCH | TRANSDERMAL | Status: DC

## 2012-05-05 NOTE — Patient Instructions (Signed)
It was good to see you. You have a full thickness tear of your supraspinatus tendon. We will try exercises and the nitro patch.  Remember you may get a headache while on the patch.  If the pain gets worse or you are ready for surgery please call and we will get you to the right people.  Otherwise come back in 6 weeks and we will see if you are improving.

## 2012-05-05 NOTE — Progress Notes (Signed)
Assessment: Right shoulder pain with it he diagnosed but chronic full-thickness supraspinatus muscle tear seen on MRI. PLAN: Long discussion. She does not want to entertain surgical options at this time. Long discussion with her explaining that she cannot have good surgical outcome if the muscle is significantly retracted and at this point she has 2 cm of retraction. In fact, her time frame is narrowing for any potential surgical intervention. Ultimately she decided to try a nitroglycerin patch in home exercise program. She's aware that this will not heal the muscle tear, but that it has the potential to improve her pain with increased blood flow to the shoulder. I'll see her back in 4-6 weeks. Ultimately I think she should pursue the surgical intervention.

## 2012-05-05 NOTE — Assessment & Plan Note (Signed)
Patient does have a full-thickness retracted supraspinatus tear of the left shoulder. Discussed different treatment options with patient for greater than 40 minutes today. Patient at the end of this did elect to try conservative therapy which includes continuing home exercises with theraband.  The patient will also be started on a nitroglycerin patch and warned of potential side effects. We will do this to see if increasing vasodilation does allow for increasing healing. Patient knows at any point if she changes her mind we will send a referral to an orthopedic surgeon, likely Dr. Ave Filter or Dr. Dion Saucier.  Patient does get Vicodin from her primary care provider. Patient return in 6 weeks to see if she has any improvement.

## 2012-05-05 NOTE — Progress Notes (Signed)
Chief complaint: Left shoulder pain  Patient is a 52 year old female who has had bilateral shoulder pain left greater than right for significant amount of time. Please see previous notes about history. Patient has had corticosteroid injections into the left shoulder twice with no improvement. Patient also also done some home exercises and did not seem to improve. 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  Patient was sent for an MRI which was reviewed by me today. Patient's MRI does show a full-thickness retracted supraspinatus tear on her left side.  Past medical history, social, surgical and family history all reviewed.   Denies fever, chills, nausea vomiting abdominal pain, dysuria, chest pain, shortness of breath dyspnea on exertion or numbness in extremities  Physical exam Blood pressure 149/85, pulse 90, height 5\' 7"  (1.702 m), weight 245 lb (111.131 kg). General: No apparent distress alert and oriented x3 mood and affect somewhat normal  Respiratory: Patient's recent full sentences and does not appear short of breath Skin: Warm dry intact with no signs of infection or rash Neuro: Cranial nerves II through XII are intact, neurovascularly intact in all extremities with 2+ DTRs and 2+ pulses. Left shoulder exam: Patient does have for flexion 160 internal rotation to L5 and external rotation of approximately 25 on the left side actively. Passively patient can get to full 180 but does have pain with Juanetta Gosling and Neer. Patient also has a positive empty can and does have a positive drop arm test. Patient has 3+ out of 5 strength on the left side compared to 4/5 strength on the right side. She is neurovascularly intact distally.

## 2012-05-11 ENCOUNTER — Encounter: Payer: Self-pay | Admitting: Pharmacist

## 2012-05-11 ENCOUNTER — Ambulatory Visit (INDEPENDENT_AMBULATORY_CARE_PROVIDER_SITE_OTHER): Payer: No Typology Code available for payment source | Admitting: Pharmacist

## 2012-05-11 VITALS — Ht 67.0 in | Wt 243.0 lb

## 2012-05-11 DIAGNOSIS — R079 Chest pain, unspecified: Secondary | ICD-10-CM

## 2012-05-11 DIAGNOSIS — R0989 Other specified symptoms and signs involving the circulatory and respiratory systems: Secondary | ICD-10-CM

## 2012-05-11 DIAGNOSIS — R0609 Other forms of dyspnea: Secondary | ICD-10-CM

## 2012-05-11 DIAGNOSIS — R0600 Dyspnea, unspecified: Secondary | ICD-10-CM | POA: Insufficient documentation

## 2012-05-11 NOTE — Assessment & Plan Note (Signed)
Spirometry evaluation reveals normal lung function.  Patient has been experiencing chest tightness, shortness of breath, and dizziness and currently has no medications for these symptoms. Reviewed results of pulmonary function tests.  Pt verbalized understanding of results.  Pt given peak flow meter to assess lung function when she is having symptoms.  Provided log as well in order to record triggers and associated peak flow. Demonstrated adequate technique. Peak flow in office today was 450.  Written pt instructions provided.  F/U with Dr. Armen Pickup in February to assess need to add medication.  Total time in face to face counseling 30 minutes.  Patient seen with Doris Cheadle PharmD, Pharmacy Resident.

## 2012-05-11 NOTE — Patient Instructions (Addendum)
Your lung function tests look normal today. We are going to have you use a peak flow meter to see how your lungs are working when you start having symptoms to see if it drops lower at these times.  Your peak flow today is 450.  Keep a log of what you have been around and your peak flow when you are having symptoms.    We will not add any medications today. We will wait and see how your peak flows look. Follow up with Dr. Armen Pickup in February.

## 2012-05-11 NOTE — Progress Notes (Signed)
  Subjective:    Patient ID: Norma [name], female    DOB: [birth year], 52 y.o.   MRN: [id number]  HPI Pt arrives to clinic in good spirits for PFTs.  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  She identifies cigarette smoke as a trigger but cannot identify any others. 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 Currently has no inhalers or airway medications.  Denies any symptoms today.   Review of Systems     Objective:   Physical Exam  See Documentation Flowsheet (discrete results - PFTs) for complete Spirometry results. Patient provided good effort while attempting spirometry.        Assessment & Plan:  Spirometry evaluation reveals normal lung function.  Patient has been experiencing chest tightness, shortness of breath, and dizziness and currently has no medications for these symptoms. Reviewed results of pulmonary function tests.  Pt verbalized understanding of results.   Pt given peak flow meter to assess lung function when she is having symptoms.  Provided log as well in order to record triggers and associated peak flow. Demonstrated adequate technique. Peak flow in office today was 450.  Written pt instructions provided.  F/U with Dr. Armen Pickup in February to assess need to add medication.  Total time in face to face counseling 30 minutes.  Patient seen with Doris Cheadle PharmD, Pharmacy Resident.

## 2012-05-15 NOTE — Progress Notes (Signed)
Patient ID: Norma [name], female   DOB: January 14, 1961, 52 y.o.   MRN: [id number] Reviewed: Agree with Dr. Macky Lower documentation and management.

## 2012-05-22 ENCOUNTER — Telehealth: Payer: Self-pay | Admitting: Family Medicine

## 2012-05-22 ENCOUNTER — Ambulatory Visit (INDEPENDENT_AMBULATORY_CARE_PROVIDER_SITE_OTHER): Payer: No Typology Code available for payment source | Admitting: Family Medicine

## 2012-05-22 ENCOUNTER — Encounter: Payer: Self-pay | Admitting: Family Medicine

## 2012-05-22 VITALS — BP 120/84 | HR 82 | Ht 67.5 in | Wt 246.0 lb

## 2012-05-22 DIAGNOSIS — R519 Headache, unspecified: Secondary | ICD-10-CM | POA: Insufficient documentation

## 2012-05-22 DIAGNOSIS — R51 Headache: Secondary | ICD-10-CM

## 2012-05-22 MED ORDER — SUMATRIPTAN SUCCINATE 6 MG/0.5ML ~~LOC~~ SOLN
6.0000 mg | Freq: Once | SUBCUTANEOUS | Status: AC
  Administered 2012-05-22: 6 mg via SUBCUTANEOUS

## 2012-05-22 NOTE — Telephone Encounter (Signed)
Called patient to check up on response to imitrex injection. No answer. Left VM.  Will call back later this afternoon.

## 2012-05-22 NOTE — Patient Instructions (Addendum)
Norma [name],  Please go home and rest today.  I will call you later this afternoon to see how the symptoms are doing.   If needed be I will send in additional therapy.  Dr. Armen Pickup

## 2012-05-22 NOTE — Assessment & Plan Note (Signed)
A: unilateral headache x 3 days. Reassuring neuro exam. Possible migraine. P: Golovin imitrex 6 mg x one in office. Close f/u over phone to monitor response.  If needed, may give another dose of triptan 2 hrs later. Or patient to go to UC/ED for treatment of migraine under observation.

## 2012-05-22 NOTE — Progress Notes (Signed)
Subjective:     Patient ID: Norma [name], female   DOB: [birth year], 52 y.o.   MRN: [id number]  HPI 52 yo F presents for same day visit to discuss the following:  1. Headache: top of her head and L temple. X 3 days. Dull with intermittent sharp pain. Denies fever, weakness, head injury, vision changes. Pain is worsening, now 7/10 in severity,  and is associated with dizziness, insomnia and sensitivity. She denies sensitivity to sound and nausea. She tried tylenol and aleve w/o relife of symptoms.   Review of Systems As per HPI  Chest: still with intermittent pressure and palpitations.      Objective:   Physical Exam BP 120/84  Pulse 82  Ht 5' 7.5" (1.715 m)  Wt 246 lb (111.585 kg)  BMI 37.96 kg/m2 General appearance: alert, cooperative and no distress Heart: regular rate and rhythm, S1, S2 normal, no murmur, click, rub or gallop Eyes: conjunctivae/corneas clear. PERRL, EOM's intact. Fundi benign. Neurologic: Alert and oriented X 3, normal strength and tone. Normal symmetric reflexes. Normal coordination and gait    Assessment and Plan:

## 2012-05-23 ENCOUNTER — Telehealth: Payer: Self-pay | Admitting: Family Medicine

## 2012-05-23 NOTE — Telephone Encounter (Signed)
Patient is calling to let Dr. Armen Pickup know that the Imitrex helped for a while, but the headache has come back.  She really needs to try to work, but isn't sure that she should be driving.

## 2012-05-24 MED ORDER — SUMATRIPTAN SUCCINATE 50 MG PO TABS: ORAL_TABLET | ORAL | Status: DC

## 2012-05-24 NOTE — Telephone Encounter (Signed)
Called patient. She received 4  Hrs of headache relief following IM injection. Plan to send Imitrex to her pharmacy. Advised that she could take one dose and another in 2 hrs if needed but she is not to exceed more than two doses per week.   She agreed with plan and voiced understanding.

## 2012-05-24 NOTE — Telephone Encounter (Signed)
Patient is calling back, still having headaches and has not heard back from Dr. Armen Pickup or the nurse.

## 2012-05-25 ENCOUNTER — Telehealth: Payer: Self-pay | Admitting: Family Medicine

## 2012-05-25 NOTE — Telephone Encounter (Signed)
Called patient back  1. Differin gel was prescribed and was too expensive. patient with acne. Using dove soap. Advised her to buy an OTC face wash with salicylic acid.   2. imitrex $59 dollars.   Called [facility] [employer] [facility], imitrex is on formularly. Patient will have to re-enroll for benefits.  Called patient, she is willing to re-enroll for benefits.

## 2012-05-25 NOTE — Telephone Encounter (Signed)
Patient is calling because the medications that were sent in for headaches and for her face were way too expensive and she really needs something that is on the $4 plan.  The headaches are continuing and she would really like some relief soon.

## 2012-05-26 ENCOUNTER — Ambulatory Visit: Payer: No Typology Code available for payment source

## 2012-05-26 ENCOUNTER — Ambulatory Visit: Payer: No Typology Code available for payment source | Admitting: Family Medicine

## 2012-06-06 ENCOUNTER — Encounter: Payer: Self-pay | Admitting: Family Medicine

## 2012-06-06 ENCOUNTER — Ambulatory Visit (INDEPENDENT_AMBULATORY_CARE_PROVIDER_SITE_OTHER): Payer: No Typology Code available for payment source | Admitting: Family Medicine

## 2012-06-06 VITALS — BP 126/75 | HR 98 | Temp 98.3°F | Ht 67.0 in | Wt 248.0 lb

## 2012-06-06 DIAGNOSIS — M79605 Pain in left leg: Secondary | ICD-10-CM

## 2012-06-06 DIAGNOSIS — M79609 Pain in unspecified limb: Secondary | ICD-10-CM

## 2012-06-06 MED ORDER — GABAPENTIN 100 MG PO CAPS: 100.0000 mg | ORAL_CAPSULE | Freq: Three times a day (TID) | ORAL | Status: DC

## 2012-06-06 NOTE — Progress Notes (Signed)
  Subjective:    Patient ID: Norma [name], female    DOB: 1960/10/27, 52 y.o.   MRN: [id number]  HPI  1. Leg Pain:  C/o of L leg pain.  Has been going on x1 week.  Pain described as sharp pain that radiates down the back and side of the leg.  Does have some numbness in the foot. She reports that she has a history of DDD.  She denies any bowel or bladder incontinence, foot drop or difficulty walking.  She has tried prednisone before and had a reaction to this medications.  She had been given rx for gabapentin in the past but could not afford, but would be interested in trying.   Review of Systems Per HPI    Objective:   Physical Exam  Constitutional: No distress.  Obese female   HENT:  Head: Normocephalic and atraumatic.  Musculoskeletal:  Lower back with no spasm, no bony tenderness and no stepoffs palpated.  SLR positive for recreation of symptoms.  FABER with moderate pain.  Strength 5/5.  DTR's 2+          Assessment & Plan:

## 2012-06-06 NOTE — Patient Instructions (Addendum)
Thank you for coming in today, it was good to see you Start taking the gabapentin at night for a few days as it can make you drowsy.  After 3-4 days if tolerating well increase to three times per day For the next week use 800mg  of ibuprofen every 8 hours.  Follow up with your pcp in 1-2 weeks.

## 2012-06-06 NOTE — Assessment & Plan Note (Addendum)
History of DDD,on Xray.  I do not see a previous MRI.  Her symptoms of consistent with sciatic radiculopathy.  She can not take steroids. She already has chronic pain medications. She can get 100mg  tabs of gabapentin at [facility] for $12/90 days so will start her on this.  Schedule ibuprofen daily.  Advised weight loss and back strengthening exercises.  Follow up with pcp in 1-2 weeks.

## 2012-06-16 ENCOUNTER — Ambulatory Visit (INDEPENDENT_AMBULATORY_CARE_PROVIDER_SITE_OTHER): Payer: No Typology Code available for payment source | Admitting: Family Medicine

## 2012-06-16 ENCOUNTER — Encounter: Payer: Self-pay | Admitting: Family Medicine

## 2012-06-16 ENCOUNTER — Telehealth: Payer: Self-pay | Admitting: *Deleted

## 2012-06-16 VITALS — BP 162/90 | HR 73 | Ht 67.0 in | Wt 248.0 lb

## 2012-06-16 DIAGNOSIS — M75102 Unspecified rotator cuff tear or rupture of left shoulder, not specified as traumatic: Secondary | ICD-10-CM

## 2012-06-16 DIAGNOSIS — S43429A Sprain of unspecified rotator cuff capsule, initial encounter: Secondary | ICD-10-CM

## 2012-06-16 MED ORDER — CARISOPRODOL 350 MG PO TABS: ORAL_TABLET | ORAL | Status: DC

## 2012-06-16 MED ORDER — GABAPENTIN 300 MG PO CAPS: ORAL_CAPSULE | ORAL | Status: DC

## 2012-06-16 NOTE — Patient Instructions (Addendum)
Taper gabapentin by starting at ine tab at night, in  3-7 days increase to two tabs at night and if needed can increase to 2 at night, one in am and then on to 2 at night, one in am and one at lunch  We will send your records to [facility] you haven't heard from either them or [place] in 3 weeks, please call the office. I will see you back as you need--I will also increase your gabapentin if we are not at a good dose--max dose is 3600 and you will be only 1/3 of the way there.

## 2012-06-16 NOTE — Telephone Encounter (Signed)
Dr. Jennette Kettle changed pt to [facility].  Pt notified.

## 2012-06-16 NOTE — Telephone Encounter (Signed)
Message copied by Mora Bellman on Fri Jun 16, 2012 12:54 PM ------      Message from: Lizbeth Bark      Created: Fri Jun 16, 2012 11:48 AM      Regarding: phone message      Contact: [phone]       Pt is at pharmacy now, please call her regarding gabapentin rx. She said Dr. Jennette Kettle told her to call if the price was much higher then the last script.  ------

## 2012-06-19 ENCOUNTER — Ambulatory Visit (INDEPENDENT_AMBULATORY_CARE_PROVIDER_SITE_OTHER): Payer: No Typology Code available for payment source | Admitting: Family Medicine

## 2012-06-19 ENCOUNTER — Encounter: Payer: Self-pay | Admitting: Family Medicine

## 2012-06-19 VITALS — BP 155/93 | HR 63 | Temp 98.1°F | Ht 67.5 in | Wt 252.0 lb

## 2012-06-19 DIAGNOSIS — G894 Chronic pain syndrome: Secondary | ICD-10-CM

## 2012-06-19 DIAGNOSIS — M79605 Pain in left leg: Secondary | ICD-10-CM

## 2012-06-19 DIAGNOSIS — M79609 Pain in unspecified limb: Secondary | ICD-10-CM

## 2012-06-19 MED ORDER — MELOXICAM 15 MG PO TABS: 15.0000 mg | ORAL_TABLET | Freq: Every day | ORAL | Status: DC

## 2012-06-19 MED ORDER — GABAPENTIN 300 MG PO CAPS: ORAL_CAPSULE | ORAL | Status: DC

## 2012-06-19 MED ORDER — KETOROLAC TROMETHAMINE 60 MG/2ML IJ SOLN
60.0000 mg | Freq: Once | INTRAMUSCULAR | Status: AC
  Administered 2012-06-19: 60 mg via INTRAMUSCULAR

## 2012-06-19 NOTE — Assessment & Plan Note (Signed)
A: persistent sciatica. Worsened with continued work. P: 1. Take 15 mg of mobic daily for next week. Stop ibuprofen.  2. Restart gabapentin 300 mg once nightly, increase as below.  3. Schedule with Dr. Berline Chough for manipulation. 4. Two days off from work to focus on rehab-planks and core strenghtening.   5. F/u with me in 2 weeks.

## 2012-06-19 NOTE — Progress Notes (Signed)
  Subjective:    Patient ID: Norma [name], female    DOB: [birth year], 52 y.o.   MRN: [id number]  HPI Followup shoulder pain with known full thickness tear of the supraspinatus by MRI. Given her lack of insurance we had proceeded with conservative care her try nitroglycerin patch. She is unable to tolerate that secondary to headache and lightheadedness. The gabapentin has seemed to help. Also seems to help her left lower extremity pain which she says is sciatic in nature and long-standing. She would like to go ahead and be evaluated for indigent surgical care if possible.   Review of Systems Continues with left shoulder pain and left lower extremity pain. Denies unusual weight change, fever, sweats, chills.    Objective:   Physical Exam  Vital signs are reviewed GENERAL: Well-developed overweight female LEFT shoulder pain and weakness with supraspinatus testing and external rotation. Distally neurovascularly intact IMAGING: Reviewed MRI which is full thickness supraspinatus tear a lot of tendinopathy      Assessment & Plan:  #1. Full-thickness supraspinatus tear left rotator cuff. We'll try to increase her gabapentin for pain management. She was unable to tolerate nitroglycerin patch. We'll try to set her up with referral center for indigent care surgical consult at either [facility] or [facility] at [facility]

## 2012-06-19 NOTE — Progress Notes (Signed)
Subjective:     Patient ID: Norma [name], female   DOB: 25-Nov-1960, 52 y.o.   MRN: [id number]  HPI 52 yo F presents to f/u L low back pain: patient with constat LL back. Pain acutely worsened 2-3 weeks ago following tripping over exercise equipment. Patient now with constant LL back pain that radiates down the back of her leg to her foot. Associated with tingling/numbness in the same distribution. Also with weakness on the L side. She denies groin pain, fecal/urinary incontinence. She has been taking motrin 600 mg BID. Was prescribed gabapentin but unable to get it due to cost. No improvement with soma which was prescribed for shoulder pain.   Review of Systems As per HPI     Objective:   Physical Exam BP 155/93  Pulse 63  Temp(Src) 98.1 F (36.7 C) (Oral)  Ht 5' 7.5" (1.715 m)  Wt 252 lb (114.306 kg)  BMI 38.86 kg/m2 General appearance: alert, cooperative and no distress Back Exam: Inspection: normal, truncal obesity Motion: normal  SLR seated:   + on left                       SLR lying: + on left  XSLR seated: -                        XSLR lying: - Seated HS Flexibility: limited  Palpable tenderness: none  FABER: negative  Sensory change: negative  Reflex change: negative   Strength at foot Plantar-flexion: 5 / 5    Dorsi-flexion:  5/ 5    Eversion:  5/ 5   Inversion: 5 / 5 Leg strength Quad:  5/ 5   Hamstring: 5 / 5   Hip flexor:  5/ 5   Hip abductors: 5 / 5 Gait Walking:  Bent forward, slow         Heels: full          Toes:  full          Tandem: unable to perform      Assessment and Plan:

## 2012-06-19 NOTE — Patient Instructions (Addendum)
Norma [name],  Thank you for making it in today.  Your back pain is your sciatic nerve acting up. We gave you a shot of toradol in the office. When you get home please do the following:  1. Take 15 mg of mobic daily for next week. Stop ibuprofen.  2. Restart gabapentin 300 mg once nightly, increase as below.  3. Schedule with Dr. Berline Chough for manipulation.  4. F/u with me in 2 weeks.    Taper gabapentin by starting at one tab at night, in 3-7 days increase to two tabs at night, if needed in 7 days add one in am and then 7 days on to 2 at night, one in am and one at lunch   Dr. Armen Pickup

## 2012-06-21 ENCOUNTER — Telehealth: Payer: Self-pay | Admitting: Family Medicine

## 2012-06-21 NOTE — Telephone Encounter (Signed)
Taking gabapentin once at night and once during the day and mobic once daily.  Taking regular pain meds. No improvement in L leg pain.  Pain is severe in the morning.   Increase gabapentin to two pills nightly.  Continue to ambulate as tolerated.  Buy cane, recommend [employer] or medical supply store for support.

## 2012-06-21 NOTE — Progress Notes (Signed)
Called ortho at [facility]- pt will need to call ortho office financial counselor to get payment plan set up [phone], then they will set her up for initial appt main number is [phone].  Pt agreeable, she will call us back if she has any problems or questions.

## 2012-06-21 NOTE — Telephone Encounter (Signed)
Please call patient asap.  Pain to sciatica area is more intense than before.  Need to talk about pain mgt

## 2012-06-26 ENCOUNTER — Ambulatory Visit (INDEPENDENT_AMBULATORY_CARE_PROVIDER_SITE_OTHER): Payer: No Typology Code available for payment source | Admitting: Sports Medicine

## 2012-06-26 ENCOUNTER — Encounter: Payer: Self-pay | Admitting: Sports Medicine

## 2012-06-26 VITALS — BP 129/79 | HR 87 | Temp 98.2°F | Ht 67.5 in | Wt 248.1 lb

## 2012-06-26 DIAGNOSIS — M79605 Pain in left leg: Secondary | ICD-10-CM

## 2012-06-26 DIAGNOSIS — M79609 Pain in unspecified limb: Secondary | ICD-10-CM

## 2012-06-26 DIAGNOSIS — M9909 Segmental and somatic dysfunction of abdomen and other regions: Secondary | ICD-10-CM

## 2012-06-26 DIAGNOSIS — M999 Biomechanical lesion, unspecified: Secondary | ICD-10-CM

## 2012-06-26 MED ORDER — CYCLOBENZAPRINE HCL 10 MG PO TABS: 10.0000 mg | ORAL_TABLET | Freq: Three times a day (TID) | ORAL | Status: DC | PRN

## 2012-06-26 NOTE — Patient Instructions (Addendum)
It was nice to meet you.  Your chronic low back pain is coming from a likely nerve impingement.  Keep up your exercises to help with mobility.  Keep taking Mobic for now, Use the neurontin.  I believe your allergy was coming from the [facility]  I have sent in a prescription for Flexeril.  We have gotten you a letter for work  Return to see me in 2-4 weeks if needed

## 2012-06-28 ENCOUNTER — Telehealth: Payer: Self-pay | Admitting: Family Medicine

## 2012-06-28 NOTE — Telephone Encounter (Signed)
Will forward to Dr. Funches 

## 2012-06-28 NOTE — Telephone Encounter (Signed)
Gabapentin is not working and she is in a lot of pain - she had manipulation yesterday and that helped but it is already getting worse.  Needs to know what she can do.

## 2012-06-29 NOTE — Telephone Encounter (Signed)
Called patient back. Left VM. Advised increasing gabapentin by one pill.  Also recommended that patient call in for same day visit if pain is just as bad as it has been or worse.   Informed patient that I plan to order an MRI if she is no better by week 6-8, but that I examine her first to be sure that her examine has not improved.

## 2012-06-30 ENCOUNTER — Ambulatory Visit (INDEPENDENT_AMBULATORY_CARE_PROVIDER_SITE_OTHER): Payer: No Typology Code available for payment source | Admitting: Family Medicine

## 2012-06-30 VITALS — BP 138/84 | HR 81 | Ht 67.5 in | Wt 254.0 lb

## 2012-06-30 DIAGNOSIS — M79609 Pain in unspecified limb: Secondary | ICD-10-CM

## 2012-06-30 DIAGNOSIS — M79671 Pain in right foot: Secondary | ICD-10-CM | POA: Insufficient documentation

## 2012-06-30 DIAGNOSIS — M25571 Pain in right ankle and joints of right foot: Secondary | ICD-10-CM

## 2012-06-30 DIAGNOSIS — M25579 Pain in unspecified ankle and joints of unspecified foot: Secondary | ICD-10-CM

## 2012-06-30 NOTE — Patient Instructions (Addendum)
Thank you for coming in, today! I'm sorry your foot is bothering you. I want you to get an xray of this foot/ankle. This needs to be done at [facility] radiology.    This can be done at any time. You can go over there today, if you are able.    In the meantime, ibuprofen is a good medicine to try to help with the pain.    Ice (15 minutes on, 15 minutes off, for an hour at a time) and elevation can help as well. Take this paper to the front desk here to schedule an appointment at sports medicine. If at all possible, get your xray done before this appointment. Otherwise, keep your other appointments. If you have any questions or concerns in the meantime, call the clinic back. --Dr. Casper Harrison  FRONT OFFICE STAFF: Please schedule pt a follow-up appointment at Sports Medicine clinic next week. Dr. Jennette Kettle is not available; any of the other providers will work, unless there is a specific one available that she has seen before.

## 2012-06-30 NOTE — Assessment & Plan Note (Addendum)
A: New onset, roughly 1 week of right foot pain to dorsum of foot with standing/walking. No systemic symptoms. Hx of right ankle fracture but no current true ankle pain. No limited range of motion of either foot. Diminished sensation particularly to plantar foot and toes, but otherwise clinical exam is unremarkable.  P: Discussed with Dr. Jennette Kettle. Pt to continue ibuprofen and try ice/elevation and rest as much as possible. Ordered for 2-view xray of right ankle. Pt to follow up in sports medicine clinic next week, as well. Otherwise to follow up with PCP Dr. Armen Pickup as scheduled for this and other MSK complaints.

## 2012-06-30 NOTE — Progress Notes (Signed)
  Subjective:    Patient ID: Norma [name], female    DOB: 05/05/1960, 52 y.o.   MRN: [id number]  HPI: Pt presents for SDA for about 1 week of right foot pain which has been acutely worse for the past 3 days. She has not had right foot pain before (see below). The pain is described as an ache in the "top" (dorsum) of her right foot when she stands or walks; she does not have pain at rest or if she puts her foot up. The pain does not radiate into her ankle and she does not have right hip, knee, or ankle pain. She had a right ankle fracture with hardware placed surgically several years ago, but has not had problems with her right foot/ankle since.  Pt works as an Scientist, water quality. She was off two weeks early last week, but did not have any prolonged period of time off or subsequent sudden return to work or increased/unusual work-load. She denies recent trauma to the right ankle/foot or leg.  Of note, pt has a PMH significant for multiple joint/MSK-type complaints and has had several recent appointments for back and LEFT hip/leg/foot pain, but states she has not had pain on the RIGHT like this, before. She has been taking ibuprofen and gabapentin for this other pain, which has not helped much for that pain or for her right foot.  Review of Systems: As above. Otherwise denies systemic symptoms. No fever/chills, N/V, change in bowel/bladder habits. Some SOB at baseline, which is not new/different.     Objective:   Physical Exam BP 138/84  Pulse 81  Ht 5' 7.5" (1.715 m)  Wt 254 lb (115.214 kg)  BMI 39.17 kg/m2 Gen: adult female in NAD, pleasant and cooperative HEENT: MMM, EOMI, neck supple Abd: obese, soft MSK: right foot very slightly swollen compared to left, but no tenderness to palpation  Normal ROM to bilateral feet/ankles, no increased pain with passive movements of ankles or toes  No tenderness on heel squeeze or squeeze across MTP joints, bilaterally  Bilateral feet with decreased sensation to  great toes, 3rd toes, and 5th toes bilaterally  Diminished sensation to heels bilaterally, as well  Gait antalgic but otherwise normal Ext: moves all extremities equally/spontaneously; no tenderness to bilateral calves, no LE edema or redness Skin: no rashes/suspicious lesions     Assessment & Plan:

## 2012-07-02 DIAGNOSIS — M9909 Segmental and somatic dysfunction of abdomen and other regions: Secondary | ICD-10-CM | POA: Insufficient documentation

## 2012-07-02 NOTE — Progress Notes (Signed)
  [facility]  Patient name: Norma [name] MRN [id number]  Date of birth: Aug 07, 1960  CC & HPI:  Norma [name] is a 52 y.o. female presenting today for Osteopathic Manipulation:  Pt has been seen multiple times for multiple compaints.  Has been tried on Neurontin and SOMA but reporting skin itching and breaks outs starting after taking SOMA  Pt reports pain in: Location  Left leg  Onset  >2 months but minimal worsened over the past 2 weeks  Character  ache, going down to behind knee from left gluteal s/i region  Severity  up to 10/10   Temporal  worse when standing for prolonged periods  Alleviating  rest  Aggrivating  work   ROS:  No changes in bowel/bladder No falls   Pertinent History Reviewed:  Medical & Surgical Hx:  Reviewed: Significant for multiple prior MSK injuries and complaints along with HTN, & GERD Medications: Reviewed & Updated - see associated section Social History: Reviewed -  reports that she has never smoked. She has never used smokeless tobacco.  Objective Findings:  Vitals: BP 129/79  Pulse 87  Temp(Src) 98.2 F (36.8 C) (Oral)  Ht 5' 7.5" (1.715 m)  Wt 248 lb 1.6 oz (112.537 kg)  BMI 38.26 kg/m2  PE: GENERAL:  Adult obese AA  female. In no discomfort; no respiratory distress. MSK/Osteopathic:  NEURO EXAM:  LE:   Strength:      B 5+/5 and symmetric in all myotomes unless indicated as below:           > intact  Sensation:    B Grossly intact in all dermatomes unless indicated as below:           > intact  Special Testing:    negative straight leg raise B, negative braggard Ham String Flexibility ~35o on L, ~45 on R DTRs: 2+/4 B Knee crepitations on ROM testing, +patellar Grind test    Findings:  Leg Length screening: short right leg  Standing Flexion Test: positive left  Seated Flexion/ASIS CompressionTest: positive left Diagnoses: CERVICAL    THORACIC    RIBS    LUMBAR  L5 RL SR  SACRUM  RonR sacral Torsion   PELVIS  L anterior innominant  LE  L femur externally rotated  Genu valgus L>R  UE     Assessment & Plan:

## 2012-07-02 NOTE — Assessment & Plan Note (Addendum)
Decision today to treat with OMT was based on Physical Exam.  Verbal consent was obtained after explanation of risks, benefits and potential side-effects including acute pain flare, post-manipulation soreness, and need for repeat treatments.    The Patient was treated with CS (Counter Strain), MFR (Myofascial Release), ME (Muscle Energy), HVLA (High Velocity Low Amplitude / Thrust) techniques in Lumbar, Sacral, Pelvis and LE areas  Patient tolerated the procedure well with improvement in symptoms without exacerbation.  Patient given medications, exercises, stretches and lifestyle modifications per AVS and verbally.    Patient will follow up in 2-4 weeks

## 2012-07-02 NOTE — Assessment & Plan Note (Addendum)
Likely multifactorial. No evidence of HNP on exam but likely some foraminal encroachment from DJD in spine as evidenced on X-ray 1 year ago.  Encouraged to continue home exercise program pt currently participating in and weight loss. Reported significant improvement in pain today following manipulation. Changed Flexeril for Soma and potential allergy. Continue Mobic and Neurontin

## 2012-07-03 ENCOUNTER — Ambulatory Visit (HOSPITAL_COMMUNITY)
Admission: RE | Admit: 2012-07-03 | Discharge: 2012-07-03 | Disposition: A | Discharge status: Home / Self Care | Payer: Self-pay | Source: Ambulatory Visit | Attending: Family Medicine | Admitting: Family Medicine

## 2012-07-03 ENCOUNTER — Telehealth: Payer: Self-pay | Admitting: *Deleted

## 2012-07-03 DIAGNOSIS — M7730 Calcaneal spur, unspecified foot: Secondary | ICD-10-CM | POA: Insufficient documentation

## 2012-07-03 DIAGNOSIS — M79671 Pain in right foot: Secondary | ICD-10-CM

## 2012-07-03 DIAGNOSIS — M25571 Pain in right ankle and joints of right foot: Secondary | ICD-10-CM

## 2012-07-03 DIAGNOSIS — M25579 Pain in unspecified ankle and joints of unspecified foot: Secondary | ICD-10-CM | POA: Insufficient documentation

## 2012-07-03 NOTE — Telephone Encounter (Signed)
Left message for patient to return call, please read message from Dr Street.Norma [name]  

## 2012-07-03 NOTE — Telephone Encounter (Signed)
Message copied by Jennette Bill on Mon Jul 03, 2012  4:47 PM ------      Message from: Bobbye Morton      Created: Mon Jul 03, 2012  4:30 PM       Please call [name] and tell her that her xray does not look changed from the last xray that was done (there are no new fractures or obvious new causes for her foot pain). Please instruct her to keep her appointments with Dr. Armen Pickup on 3/19 and with Dr. Jennette Kettle at sports medicine on 3/24. Thanks!      --CMS ------

## 2012-07-05 ENCOUNTER — Encounter: Payer: Self-pay | Admitting: Family Medicine

## 2012-07-05 ENCOUNTER — Ambulatory Visit (INDEPENDENT_AMBULATORY_CARE_PROVIDER_SITE_OTHER): Payer: Self-pay | Admitting: Family Medicine

## 2012-07-05 VITALS — BP 144/92 | HR 84 | Temp 98.2°F | Ht 67.5 in | Wt 254.0 lb

## 2012-07-05 DIAGNOSIS — L709 Acne, unspecified: Secondary | ICD-10-CM

## 2012-07-05 DIAGNOSIS — L708 Other acne: Secondary | ICD-10-CM

## 2012-07-05 DIAGNOSIS — K219 Gastro-esophageal reflux disease without esophagitis: Secondary | ICD-10-CM

## 2012-07-05 DIAGNOSIS — M79671 Pain in right foot: Secondary | ICD-10-CM

## 2012-07-05 DIAGNOSIS — M79609 Pain in unspecified limb: Secondary | ICD-10-CM

## 2012-07-05 DIAGNOSIS — I10 Essential (primary) hypertension: Secondary | ICD-10-CM

## 2012-07-05 DIAGNOSIS — M79605 Pain in left leg: Secondary | ICD-10-CM

## 2012-07-05 MED ORDER — HYDROCHLOROTHIAZIDE 25 MG PO TABS: 25.0000 mg | ORAL_TABLET | Freq: Every day | ORAL | Status: DC

## 2012-07-05 MED ORDER — POLYETHYLENE GLYCOL 3350 17 G PO PACK: 17.0000 g | PACK | Freq: Every day | ORAL | Status: DC

## 2012-07-05 MED ORDER — HYDROCODONE-ACETAMINOPHEN 10-325 MG PO TABS: 1.0000 | ORAL_TABLET | Freq: Three times a day (TID) | ORAL | Status: DC | PRN

## 2012-07-05 MED ORDER — IBUPROFEN 600 MG PO TABS: 600.0000 mg | ORAL_TABLET | Freq: Three times a day (TID) | ORAL | Status: AC | PRN

## 2012-07-05 MED ORDER — OMEPRAZOLE 20 MG PO CPDR: 20.0000 mg | DELAYED_RELEASE_CAPSULE | Freq: Every day | ORAL | Status: DC

## 2012-07-05 NOTE — Assessment & Plan Note (Signed)
A: R foot pain at Lisfranc joint. Suspect arthritis. No evidence of gout or septic arthritis. P: Ibuprofen x one week Ice

## 2012-07-05 NOTE — Patient Instructions (Addendum)
Nautia,  Thank you for coming in today.   For your back pain: Continue ibuprofen 600 mg three times daily for 10 days then take a break for 1-2 weeks.  Ice  Restart gabapentin taper  For foot pain Ice and ibuprofen   For acne:  I only see where I have prescribed differin gel in the past and that was too expensive. I recommend on OTC face wah with salicylic acid and a daily sunscreen moisturizer.   F/u in two weeks  Dr. Armen Pickup

## 2012-07-05 NOTE — Progress Notes (Signed)
Subjective:     Patient ID: Norma [name], female   DOB: Aug 02, 1960, 52 y.o.   MRN: [id number]  HPI 52 yo F presents for f/u visit to discuss the following:  1. LL back pain: ongoing now for 5 weeks. Improved a bit. Denies falls. Still has tingling and numbness down back of leg. Stopped soma and gabapentin due to generalized, papular pruritic rash. Retried soma and rash came back. Taking 800 mg ibuprofen daily, vicodin TID as prescribed and using heating pad. Has missed some days of work. Had manipulation which helped initially. Pain today is 8/10. Able to walk unassisted.  No incontinence.   2. R foot pain: anterior foot pain x 4 weeks. No new injury. Has history of R ankle injury s/p surgical repair. Denies significant redness or swelling. Denies fever.   3. Acne: persistent. Differin too expensive. Not using anything on her face currently.   Review of Systems As per HPI     Objective:   Physical Exam BP 144/92  Pulse 84  Temp(Src) 98.2 F (36.8 C) (Oral)  Ht 5' 7.5" (1.715 m)  Wt 254 lb (115.214 kg)  BMI 39.17 kg/m2 General appearance: alert, cooperative and no distress Skin: fine, erythematous papules on face. No pustules.  Extremities:  R foot TTP dorsum of foot along LisFranc joint. No erythema, mild swelling compared to L foot.  Back  Inspection: normal, truncal obesity  Motion: normal  Sensory change: negative  Reflex change: negative  Strength at L foot  Plantar-flexion: 5 / 5 Dorsi-flexion: 5/ 5 Eversion: 5/ 5 Inversion: 5 / 5  Leg strength  Quad: 5/ 5 Hamstring: 5 / 5 Hip flexor: 5/ 5 Hip abductors: 5 / 5  Gait  Walking: antalgic gait.   Reviewed R ankle x-ray with patient.     Assessment and Plan:

## 2012-07-05 NOTE — Assessment & Plan Note (Signed)
A: uncharged. P: OTC salicylic acid based facial wash  Daily sunscreen moisturizer

## 2012-07-05 NOTE — Assessment & Plan Note (Addendum)
A: improved. P: Continue ibuprofen 600 mg three times daily for 10 days then take a break for 1-2 weeks.  Ice  Restart gabapentin taper F/u in two weeks

## 2012-07-06 NOTE — Telephone Encounter (Signed)
Called and spoke with patient who had an appointment yesterday and was given message.Busick, Rodena Medin

## 2012-07-10 ENCOUNTER — Telehealth: Payer: Self-pay | Admitting: Family Medicine

## 2012-07-10 ENCOUNTER — Ambulatory Visit: Payer: Self-pay | Admitting: Family Medicine

## 2012-07-10 MED ORDER — TRAZODONE HCL 150 MG PO TABS: 150.0000 mg | ORAL_TABLET | Freq: Every day | ORAL | Status: DC

## 2012-07-10 NOTE — Telephone Encounter (Signed)
Pt having allergic reaction to gabapentin - breaking out all over - not sure what to do.

## 2012-07-10 NOTE — Telephone Encounter (Signed)
Patient states she restarted Gabapentin after last office visit with PCP and she started breaking out again  all over and itching.  Does not feel like she needs to come in.  She has stopped medication.   She is taking benadryl and plans to get something to rub on to help itching also. Will forward message to Dr. Armen Pickup.

## 2012-07-10 NOTE — Telephone Encounter (Signed)
Called patient. She is still itching. Stopped gabapentin and soma. Patient reports same LL back pain now with new tingling down R posterior thigh.   Has [facility] visit for f/u foot pain that she does not think she can make today. Reports foot pain is better.  Plan: Reschedule [facility] visit for back pain eval. My plan is to get MRI if there is no improvement in next 2 weeks.

## 2012-07-14 ENCOUNTER — Ambulatory Visit: Payer: Self-pay | Admitting: Sports Medicine

## 2012-07-17 ENCOUNTER — Ambulatory Visit (INDEPENDENT_AMBULATORY_CARE_PROVIDER_SITE_OTHER): Payer: Self-pay | Admitting: Family Medicine

## 2012-07-17 ENCOUNTER — Encounter: Payer: Self-pay | Admitting: Family Medicine

## 2012-07-17 VITALS — BP 143/93 | HR 78

## 2012-07-17 DIAGNOSIS — M79605 Pain in left leg: Secondary | ICD-10-CM | POA: Insufficient documentation

## 2012-07-17 DIAGNOSIS — M545 Low back pain, unspecified: Secondary | ICD-10-CM

## 2012-07-17 MED ORDER — DIAZEPAM 5 MG PO TABS: ORAL_TABLET | ORAL | Status: DC

## 2012-07-17 NOTE — Patient Instructions (Addendum)
MRI [facility] APR 3RD AT 2PM [phone]

## 2012-07-18 ENCOUNTER — Encounter: Payer: Self-pay | Admitting: Family Medicine

## 2012-07-18 NOTE — Progress Notes (Signed)
  Subjective:    Patient ID: Norma [name], female    DOB: 11-29-1960, 52 y.o.   MRN: [id number]  HPI Low back pain radiating to her left leg. Has been present for many months but over the last 6 weeks has progressively gotten worse. She is actually not been working last couple of weeks hoping it would get better with her decreased activity. Pain is constant, radiates down to the ball of her foot. Burning in nature. 7/10 at its worst. No improvement with sitting or position change. Occasionally keeping her awake at night. No specific incident that seemed to make this worse.   Review of Systems Denies any urinary or fecal incontinence. See history of present illness above for additional pertinent review of systems.    Objective:   Physical Exam Vital signs are reviewed GENERAL: Well-developed female no acute distress overweight. BACK: Nontender to palpation. No defect. EXTREMITIES: Lower extremity strength 5 out of 5 in hip flexors. DTRs 2+ at the knee bilaterally. Straight leg raise positive seated and supine on the left.       Assessment & Plan:

## 2012-07-20 ENCOUNTER — Ambulatory Visit (HOSPITAL_COMMUNITY)
Admission: RE | Admit: 2012-07-20 | Discharge: 2012-07-20 | Disposition: A | Discharge status: Home / Self Care | Payer: Self-pay | Source: Ambulatory Visit | Attending: Family Medicine | Admitting: Family Medicine

## 2012-07-20 DIAGNOSIS — F40298 Other specified phobia: Secondary | ICD-10-CM | POA: Insufficient documentation

## 2012-07-20 DIAGNOSIS — M79605 Pain in left leg: Secondary | ICD-10-CM

## 2012-07-20 DIAGNOSIS — G8929 Other chronic pain: Secondary | ICD-10-CM | POA: Insufficient documentation

## 2012-07-20 DIAGNOSIS — M545 Low back pain, unspecified: Secondary | ICD-10-CM | POA: Insufficient documentation

## 2012-07-21 ENCOUNTER — Telehealth: Payer: Self-pay | Admitting: Family Medicine

## 2012-07-21 NOTE — Telephone Encounter (Signed)
Amy Plz call and tell her there is asome new nerve impingement on the left. Does she want [place] to set her up for NEUROSURG? THANKS! Denny Levy

## 2012-07-24 ENCOUNTER — Encounter: Payer: Self-pay | Admitting: Family Medicine

## 2012-07-24 ENCOUNTER — Ambulatory Visit (INDEPENDENT_AMBULATORY_CARE_PROVIDER_SITE_OTHER): Payer: Self-pay | Admitting: Family Medicine

## 2012-07-24 VITALS — BP 134/80 | HR 71 | Ht 67.0 in | Wt 257.0 lb

## 2012-07-24 DIAGNOSIS — M79605 Pain in left leg: Secondary | ICD-10-CM

## 2012-07-24 DIAGNOSIS — M545 Low back pain, unspecified: Secondary | ICD-10-CM

## 2012-07-24 DIAGNOSIS — I10 Essential (primary) hypertension: Secondary | ICD-10-CM

## 2012-07-24 LAB — BASIC METABOLIC PANEL
BUN: 14 mg/dL (ref 6–23)
CO2: 28 mEq/L (ref 19–32)
Calcium: 9.1 mg/dL (ref 8.4–10.5)
Chloride: 104 mEq/L (ref 96–112)
Creat: 0.82 mg/dL (ref 0.50–1.10)
Glucose, Bld: 101 mg/dL — ABNORMAL HIGH (ref 70–99)
Potassium: 4.1 mEq/L (ref 3.5–5.3)
Sodium: 137 mEq/L (ref 135–145)

## 2012-07-24 NOTE — Telephone Encounter (Addendum)
Pt requests a referral to comp rehab at [facility], they are not currently taking new self pay patients. Sent specialty referral request form to [employer] form. Also sent referral to [facility] outpatient neurosurgery clinic.   Asked pt to call in 1 week if she has not heard from them, so I can follow up- pt agreeable.

## 2012-07-24 NOTE — Assessment & Plan Note (Addendum)
A: chronic pain with S1 nerve impingement on MRI. P: refer to ortho once insured.  Continue current medication regimen including NSAID, check Cr.  No steroid due to allergy

## 2012-07-24 NOTE — Patient Instructions (Addendum)
Norma [name],  Thank you for coming in today. Your MRI shows multiple levels of disc disease, the L5-S1 level is causing your pain.  For this: Slowing titrate up on gabapentin as tolerated by increase by one tab ever 3 days. Max is 3600 (12 tabs) daily.  Will refer you to ortho once you get [employer] card.   For chronic use and ibuprofen and elevated BP: checking BMP today.   Dr. Armen Pickup

## 2012-07-24 NOTE — Progress Notes (Signed)
Subjective:     Patient ID: Norma [name], female   DOB: 09-20-1960, 52 y.o.   MRN: [id number]  HPI 52 yo F presents for f/u visit to review MRI of low back:  Low back pain: x 8 weeks. Pain b/l back radiating down L hip. Pain worse with standing. Patient has restarted gabapentin 300 mg TID, tolerating well with minimal rash/ithcing. Also taking scheduled ibuprofen. Denies weight loss, weakness, fever and urinary/fecal incontinence.   Review of Systems As per HPI     Objective:   Physical Exam BP 134/80  Pulse 71  Ht 5\' 7"  (1.702 m)  Wt 257 lb (116.574 kg)  BMI 40.24 kg/m2 General appearance: alert, cooperative and no distress Gait: normal Back: negative sitting, positive lying straight leg raising on the L.   MRI reviewed: see results review       Assessment and Plan:

## 2012-07-25 ENCOUNTER — Telehealth: Payer: Self-pay | Admitting: *Deleted

## 2012-07-25 NOTE — Telephone Encounter (Signed)
Message copied by Osborne Oman on Tue Jul 25, 2012 12:43 PM ------      Message from: Dessa Phi      Created: Tue Jul 25, 2012 11:48 AM       Cr stable.      Ok to continue ibuprofen, please inform patient. ------

## 2012-07-25 NOTE — Telephone Encounter (Signed)
LMOVM for pt to return call.  When she call back please inform her that her kidney function looks good and that Dr. Armen Pickup said that she can continue the ibuprofen. [name], [name]

## 2012-07-26 ENCOUNTER — Telehealth: Payer: Self-pay | Admitting: Family Medicine

## 2012-07-26 NOTE — Telephone Encounter (Signed)
Patient called back and was given the message about her Kidney Function and to continue the Ibuprofen but she is asking for more of the Ibuprofen to be called in because she is almost out.

## 2012-07-27 MED ORDER — IBUPROFEN 600 MG PO TABS: 600.0000 mg | ORAL_TABLET | Freq: Three times a day (TID) | ORAL | Status: DC | PRN

## 2012-07-27 NOTE — Telephone Encounter (Signed)
Ibuprofen sent

## 2012-07-28 ENCOUNTER — Telehealth: Payer: Self-pay | Admitting: Family Medicine

## 2012-07-28 DIAGNOSIS — M79605 Pain in left leg: Secondary | ICD-10-CM

## 2012-07-28 DIAGNOSIS — M545 Low back pain: Secondary | ICD-10-CM

## 2012-07-28 NOTE — Telephone Encounter (Signed)
Patient wanted to let Dr. Armen Pickup know that she has the [facility] Card so she can move forward with getting the appt with the surgeon.

## 2012-07-28 NOTE — Telephone Encounter (Signed)
There are very limited orthopedic specialist contracted thru [facility].  Please advise patient it could take up to 6 months to get in with an Orthopedist, if we are lucky.  Ileana Ladd

## 2012-07-28 NOTE — Telephone Encounter (Signed)
Ortho referral placed.   Please inform patient.

## 2012-07-28 NOTE — Telephone Encounter (Signed)
Please be aware there are no neurosurgeons available thru [facility].  Ileana Ladd

## 2012-07-28 NOTE — Telephone Encounter (Signed)
Spoke with patient, she thought this was for a Midwife.  After looking thru her notes it seems that the pt is being referred by [facility] (Dr. Jennette Kettle).  Advised I would send a message to Dr. Armen Pickup and make her aware of phone note on 07/21/12 to see if she still wants [place] to do the ortho appt.  Pt is agreeable. Norma [name], [name]

## 2012-08-01 ENCOUNTER — Telehealth: Payer: Self-pay | Admitting: *Deleted

## 2012-08-01 NOTE — Telephone Encounter (Signed)
Called neurosurgery at [facility]- refaxed referral info- they said neurology had received it the 1st time.   Called [phone], faxed to [phone]  Gave pt the neurosurgery office number as well.

## 2012-08-01 NOTE — Telephone Encounter (Signed)
Message copied by Jacki Cones C on Tue Aug 01, 2012  4:30 PM ------      Message from: Lizbeth Bark      Created: Tue Aug 01, 2012  9:59 AM      Regarding: refferal      Contact: [phone]       Pt wants you to call her regarding referral, states she hasn't heard anything from then. ------

## 2012-08-02 ENCOUNTER — Telehealth: Payer: Self-pay | Admitting: Family Medicine

## 2012-08-02 NOTE — Telephone Encounter (Signed)
Will forward to MD  1.  Neurosurgery is NOT an option for orange card.  2. Orthopaedic will take 5-6 months

## 2012-08-02 NOTE — Telephone Encounter (Signed)
Patient would like to speak to Dr. Armen Pickup about her back.  She would like to go to a Retail buyer with the [employer].  She was told the other day that she would be sent to see a bone specialist but it would take 5 months, but she can't wait that long.

## 2012-08-03 NOTE — Telephone Encounter (Signed)
Called patient back. Left VM explaining the situation. No neurosurgery available. Ortho best option. For now we will continue to wait for ortho. Encouraged patient to apply for insurance on exchanges. Asked patient to call me if she has questions about this.

## 2012-08-11 ENCOUNTER — Telehealth: Payer: Self-pay | Admitting: Family Medicine

## 2012-08-11 NOTE — Telephone Encounter (Signed)
Patient needs to speak to Dr. Armen Pickup because she was turned down for surgery and was suggested for pain management.  The pain medication that Dr. Armen Pickup has prescribed isn't helping so she would like to speak to Dr. Armen Pickup about other options.

## 2012-08-14 MED ORDER — HYDROCODONE-ACETAMINOPHEN 10-325 MG PO TABS: 1.0000 | ORAL_TABLET | Freq: Four times a day (QID) | ORAL | Status: DC | PRN

## 2012-08-14 NOTE — Telephone Encounter (Signed)
Called patient. She is in pain but better today. Running out of narcotic early taking 4x daily. Plan: Increase to QID New Rx printed and placed up front for pick up Patient will bring back two old rx to clinic.   Informed patient that there is not surgical intervention for shoulder or back. She would like a 2nd opinion. Will refer to local ortho once patient has medicaid.   Patient amenable to pain management.

## 2012-08-15 ENCOUNTER — Encounter: Payer: Self-pay | Admitting: Family Medicine

## 2012-08-15 ENCOUNTER — Ambulatory Visit (INDEPENDENT_AMBULATORY_CARE_PROVIDER_SITE_OTHER): Payer: No Typology Code available for payment source | Admitting: Family Medicine

## 2012-08-15 VITALS — BP 156/95 | HR 75 | Ht 67.0 in | Wt 260.2 lb

## 2012-08-15 DIAGNOSIS — R51 Headache: Secondary | ICD-10-CM

## 2012-08-15 DIAGNOSIS — Z79899 Other long term (current) drug therapy: Secondary | ICD-10-CM

## 2012-08-15 DIAGNOSIS — R739 Hyperglycemia, unspecified: Secondary | ICD-10-CM | POA: Insufficient documentation

## 2012-08-15 DIAGNOSIS — R7309 Other abnormal glucose: Secondary | ICD-10-CM

## 2012-08-15 MED ORDER — GABAPENTIN 300 MG PO CAPS: 300.0000 mg | ORAL_CAPSULE | Freq: Three times a day (TID) | ORAL | Status: DC

## 2012-08-15 NOTE — Patient Instructions (Addendum)
Mckynzie,  Thank you for coming in today. Please increase gabapentin to 600 mg at night. May also increase daytime doses as tolerated.   I will be in touch with blood sugar test.   Dr. Armen Pickup

## 2012-08-16 ENCOUNTER — Telehealth: Payer: Self-pay | Admitting: Family Medicine

## 2012-08-16 MED ORDER — METFORMIN HCL 500 MG PO TABS: 500.0000 mg | ORAL_TABLET | Freq: Two times a day (BID) | ORAL | Status: DC

## 2012-08-16 NOTE — Telephone Encounter (Signed)
Called patient.  Call completed.  A1c elevated. Patient gaining weight. Will start metformin.  Discussed back pain with Dr. Jennette Kettle. She agrees that surgery may be indicated. Recommends [facility] once patient has insurance. Will place referral to [facility] once patient has medicaid.

## 2012-08-16 NOTE — Progress Notes (Signed)
Subjective:     Patient ID: Norma [name], female   DOB: 1960-05-25, 52 y.o.   MRN: [id number]  HPI 52 yo F with chronic pain and history of migraine headaches presents with 4 days of headaches and posterior neck pain with stiffness. No fever, nausea or vomiting. Pain and stiffness improved today. Patient rubbed on voltaren gel this morning. Pain is 5/10. Patient taking gabapentin.   Patient also considered about diabetes: A1c elevated in the past. Admits to polydipsia and b/l toe pain no swelling and dorsal foot pain on the L. No polyuria. Family history of diabetes.   Review of Systems As per HPI     Objective:   Physical Exam BP 156/95  Pulse 75  Ht 5\' 7"  (1.702 m)  Wt 260 lb 3.2 oz (118.026 kg)  BMI 40.74 kg/m2 Neck: full ROM. Non tender. Negative spurling.  Extremities: extremities normal, atraumatic, no cyanosis or edema no toe redness or swelling. No foot swelling, redness or TTP.      Assessment and Plan:

## 2012-08-16 NOTE — Assessment & Plan Note (Signed)
A: repeat A1c still in pre-diabetes range. Patient gaining weight and at risk for diabetes.  P: Will discuss initiating metformin to help with weight loss and blood sugar control wiht patient. If she agrees will start medication.

## 2012-08-16 NOTE — Assessment & Plan Note (Signed)
A: headache with neck pain. Improved. No signs or cervical nerve impingement or meningitis. P: Increase nightly gabapentin per orders, see AVS.

## 2012-08-30 ENCOUNTER — Ambulatory Visit (INDEPENDENT_AMBULATORY_CARE_PROVIDER_SITE_OTHER): Payer: No Typology Code available for payment source | Admitting: Family Medicine

## 2012-08-30 ENCOUNTER — Telehealth: Payer: Self-pay | Admitting: Family Medicine

## 2012-08-30 ENCOUNTER — Encounter: Payer: Self-pay | Admitting: Family Medicine

## 2012-08-30 VITALS — BP 131/83 | HR 92 | Temp 98.2°F | Ht 67.5 in | Wt 253.1 lb

## 2012-08-30 DIAGNOSIS — G894 Chronic pain syndrome: Secondary | ICD-10-CM

## 2012-08-30 DIAGNOSIS — M549 Dorsalgia, unspecified: Secondary | ICD-10-CM

## 2012-08-30 DIAGNOSIS — L600 Ingrowing nail: Secondary | ICD-10-CM

## 2012-08-30 DIAGNOSIS — S91209A Unspecified open wound of unspecified toe(s) with damage to nail, initial encounter: Secondary | ICD-10-CM | POA: Insufficient documentation

## 2012-08-30 DIAGNOSIS — R300 Dysuria: Secondary | ICD-10-CM

## 2012-08-30 LAB — POCT UA - MICROSCOPIC ONLY

## 2012-08-30 LAB — POCT URINALYSIS DIPSTICK
Bilirubin, UA: NEGATIVE
Ketones, UA: NEGATIVE
Leukocytes, UA: NEGATIVE
Spec Grav, UA: 1.025
pH, UA: 6

## 2012-08-30 MED ORDER — OXYCODONE-ACETAMINOPHEN 7.5-325 MG PO TABS: 1.0000 | ORAL_TABLET | Freq: Four times a day (QID) | ORAL | Status: DC | PRN

## 2012-08-30 NOTE — Telephone Encounter (Signed)
LMOVM for pt to return call.  Please inform that her urine is negative (no UTI). Millenia Waldvogel, [name]

## 2012-08-30 NOTE — Assessment & Plan Note (Signed)
Painful. Thick nail.  No redness, swelling or drainage. Advised patient continue soaks. Schedule lateral toenail removal if pain persist or worsens.

## 2012-08-30 NOTE — Assessment & Plan Note (Signed)
S/p injection and massage in office. Advised patient get a total body massage.

## 2012-08-30 NOTE — Patient Instructions (Addendum)
Kitti,  Thank you for coming in today. Your back pain is due to a localized muscle spasm called trigger point.  I recommend a massage, hand stone in [facility].  I have also heard good things about kneaded energy on [place].   Continue to soak toe.  If pain worsens, you develop redness or swelling. Come back. Ultimately that side of toenail may need to be removed. Schedule a 30 minute visit for toenail removal.   For skin rash, may be due to vicodin. Percocet may do the same. Try it and let me know. One mont supply provided for now. I have noted that you turned in the two scripts for vicodin.   Dr. Armen Pickup

## 2012-08-30 NOTE — Telephone Encounter (Signed)
Pt is asking for results of her urine test today

## 2012-08-30 NOTE — Progress Notes (Addendum)
Subjective:     Patient ID: Norma [name], female   DOB: 08-25-1960, 52 y.o.   MRN: [id number]  HPI 52 yo F presents for f/u visit to discuss the following:  1. L great toe pain: x 2 months. Minimal swelling. No redness. Doing warm water and epsom salt soaks.   2. R flank pain: x 2 weeks. Recurrent. Does pain 10/10, achy and sharp. Worse with movement. Non-radiating. Denies fever and dysuria.   3. Skin rash: no improvement when off gabapentin or soma. Suspects Vicodin may be culprit. Would like to try percocet instead. Rash is pruritic.  Erythematous, pruritic papules on ex  Review of Systems As per HPI     Objective:   Physical Exam BP 131/83  Pulse 92  Temp(Src) 98.2 F (36.8 C) (Oral)  Ht 5' 7.5" (1.715 m)  Wt 253 lb 1.6 oz (114.805 kg)  BMI 39.03 kg/m2 General appearance: alert, cooperative and no distress Back: symmetric, no curvature. ROM normal. No CVA tenderness. R flank trigger point roughly 1.5 x 3 cm.  Extremities: extremities normal, atraumatic, no cyanosis or edema.  Skin: Erythematous, pruritic papules on extremities.   An injection was performed at R flank  Using 3 cc of  1% plain Lidocaine . This was well tolerated.    Assessment and Plan:

## 2012-08-30 NOTE — Telephone Encounter (Signed)
Pt called back and she was informed. Murad Staples, [name]

## 2012-08-30 NOTE — Assessment & Plan Note (Signed)
On chronic narcotics. Pains fairly well controlled. Has rash. Will switch from vicodin to percocet to see if there is any improvement in rash. Patient turned in scripts for vicodin printed on 07/2812, fill 30 and 60 days later. Patient received rx for 120 percocet today.

## 2012-09-01 ENCOUNTER — Telehealth: Payer: Self-pay | Admitting: Family Medicine

## 2012-09-01 ENCOUNTER — Ambulatory Visit (INDEPENDENT_AMBULATORY_CARE_PROVIDER_SITE_OTHER): Payer: No Typology Code available for payment source | Admitting: Family Medicine

## 2012-09-01 ENCOUNTER — Encounter: Payer: Self-pay | Admitting: Family Medicine

## 2012-09-01 VITALS — BP 137/87 | HR 80 | Temp 98.1°F | Ht 67.5 in | Wt 253.3 lb

## 2012-09-01 DIAGNOSIS — M549 Dorsalgia, unspecified: Secondary | ICD-10-CM

## 2012-09-01 MED ORDER — MELOXICAM 15 MG PO TABS: 15.0000 mg | ORAL_TABLET | Freq: Every day | ORAL | Status: DC

## 2012-09-01 MED ORDER — LORAZEPAM 1 MG PO TABS: 1.0000 mg | ORAL_TABLET | Freq: Two times a day (BID) | ORAL | Status: DC | PRN

## 2012-09-01 NOTE — Telephone Encounter (Signed)
The patient is calling to let Dr. Armen Pickup know that the severe pain on her right side like a knife stabbing is continuing.

## 2012-09-01 NOTE — Progress Notes (Signed)
  Subjective:    Patient ID: Norma [name], female    DOB: Apr 15, 1961, 52 y.o.   MRN: [id number]  HPI # SDA: right side pain She came in on Tuesday 05/14 and received trigger point injection; it helped for about 4-5 hours but then it came right back     She has had it for about 2-3 weeks now It is intermittent; she has had it in the past ROS: denies fevers, dysuria, nausea/vomiting   Medications: -Motrin -heating pad -Hydrocodone 10 given for back  Nothing has been helping   Review of Systems Per HPI  Allergies, medication, past medical history reviewed.  Smoking status noted.     Objective:   Physical Exam GEN: NAD; obese PSYCH: appears agitated/frustrated; appropriate to questions MSK: tenderness right flank area, below rib cage with palpable nodule; no skin abnormalities including rash ABD: soft, NT, ND, obese     Assessment & Plan:

## 2012-09-01 NOTE — Patient Instructions (Addendum)
Try the Ativan to help relax your muscles Continue heating pads  Stop ibuprofen Start meloxicam daily as needed  Follow-up in 1-2 weeks

## 2012-09-01 NOTE — Telephone Encounter (Signed)
Lanora Manis will you please call and triage [name].  Thanks.  Ileana Ladd

## 2012-09-01 NOTE — Telephone Encounter (Signed)
Pt reports that she has continued pain in her side as from previous office visit. Advised to use Motrin, hot showers, warm compress, muscle rubs. Call for any further problems. Pt. Verbalized understanding.  Wyatt Haste, RN-BSN

## 2012-09-01 NOTE — Assessment & Plan Note (Signed)
She reported improvement for 5-6 hours following trigger point injection a couple of days ago but now she has returned endorsing persistent significant pain.  We discussed how warm compresses and massages may help and how this pain may not be easy to treat.  -She is on hydrocodone for back pain which has not been helping this right sided pain and that a stronger pain medication is not recommended.  -Since muscle spasm may be contributing, try Ativan as anxiolytic and muscle relaxant. 10 tablets given. Discussed how will not be given long term. -We discussed how chronic pain syndrome may be contributing to pain, and she was advised to follow-up to discuss this further.

## 2012-09-02 ENCOUNTER — Emergency Department (HOSPITAL_COMMUNITY): Payer: No Typology Code available for payment source

## 2012-09-02 ENCOUNTER — Emergency Department (HOSPITAL_COMMUNITY)
Admission: EM | Admit: 2012-09-02 | Discharge: 2012-09-02 | Disposition: A | Discharge status: Home / Self Care | Payer: No Typology Code available for payment source | Attending: Emergency Medicine | Admitting: Emergency Medicine

## 2012-09-02 ENCOUNTER — Encounter (HOSPITAL_COMMUNITY): Payer: Self-pay | Admitting: Emergency Medicine

## 2012-09-02 DIAGNOSIS — K219 Gastro-esophageal reflux disease without esophagitis: Secondary | ICD-10-CM | POA: Insufficient documentation

## 2012-09-02 DIAGNOSIS — IMO0001 Reserved for inherently not codable concepts without codable children: Secondary | ICD-10-CM | POA: Insufficient documentation

## 2012-09-02 DIAGNOSIS — M7918 Myalgia, other site: Secondary | ICD-10-CM

## 2012-09-02 DIAGNOSIS — R109 Unspecified abdominal pain: Secondary | ICD-10-CM | POA: Insufficient documentation

## 2012-09-02 DIAGNOSIS — I10 Essential (primary) hypertension: Secondary | ICD-10-CM | POA: Insufficient documentation

## 2012-09-02 DIAGNOSIS — Z8739 Personal history of other diseases of the musculoskeletal system and connective tissue: Secondary | ICD-10-CM | POA: Insufficient documentation

## 2012-09-02 DIAGNOSIS — Z8679 Personal history of other diseases of the circulatory system: Secondary | ICD-10-CM | POA: Insufficient documentation

## 2012-09-02 DIAGNOSIS — Z79899 Other long term (current) drug therapy: Secondary | ICD-10-CM | POA: Insufficient documentation

## 2012-09-02 LAB — URINE MICROSCOPIC-ADD ON

## 2012-09-02 LAB — URINALYSIS, ROUTINE W REFLEX MICROSCOPIC
Glucose, UA: NEGATIVE mg/dL
Hgb urine dipstick: NEGATIVE
Ketones, ur: NEGATIVE mg/dL
pH: 8 (ref 5.0–8.0)

## 2012-09-02 MED ORDER — KETOROLAC TROMETHAMINE 60 MG/2ML IM SOLN
60.0000 mg | Freq: Once | INTRAMUSCULAR | Status: AC
  Administered 2012-09-02: 60 mg via INTRAMUSCULAR
  Filled 2012-09-02: qty 2

## 2012-09-02 MED ORDER — CYCLOBENZAPRINE HCL 10 MG PO TABS: 10.0000 mg | ORAL_TABLET | Freq: Three times a day (TID) | ORAL | Status: DC | PRN

## 2012-09-02 NOTE — ED Provider Notes (Signed)
History/physical exam/procedure(s) were performed by non-physician practitioner and as supervising physician I was immediately available for consultation/collaboration. I have reviewed all notes and am in agreement with care and plan.   Hilario Quarry, MD 09/02/12 [phone]

## 2012-09-02 NOTE — ED Notes (Signed)
Patient transported to X-ray 

## 2012-09-02 NOTE — ED Provider Notes (Signed)
History     CSN: [id number]  Arrival date & time 09/02/12  7829   First MD Initiated Contact with Patient 09/02/12 1959      Chief Complaint  Patient presents with  . Flank Pain    (Consider location/radiation/quality/duration/timing/severity/associated sxs/prior treatment) Patient is a 52 y.o. female presenting with flank pain. The history is provided by the patient.  Flank Pain This is a new problem. The current episode started in the past 7 days. The problem occurs constantly. Pertinent negatives include no abdominal pain, chest pain, coughing, fever, nausea, rash or vomiting. Associated symptoms comments: Right sided pain for the past one week. Worse with movement, better with rest. No cough, SOB, N, V, fever. She denies hematuria or frequency of urination. No history of kidney stones or known injury..    Past Medical History  Diagnosis Date  . Hypertension   . Acid reflux   . Tachyarrhythmia   . Left shoulder pain   . Back pain   . Trigger point of thoracic region 03/22/2012    History reviewed. No pertinent past surgical history.  History reviewed. No pertinent family history.  History  Substance Use Topics  . Smoking status: Never Smoker   . Smokeless tobacco: Never Used  . Alcohol Use: Yes     Comment: occasionally    OB History   Grav Para Term Preterm Abortions TAB SAB Ect Mult Living                  Review of Systems  Constitutional: Negative for fever.  Respiratory: Negative for cough and shortness of breath.   Cardiovascular: Negative for chest pain.  Gastrointestinal: Negative for nausea, vomiting and abdominal pain.  Genitourinary: Positive for flank pain. Negative for dysuria and hematuria.  Skin: Negative for rash and wound.  Psychiatric/Behavioral: Negative for confusion.    Allergies  Nortriptyline; Prednisone; and Soma  Home Medications   Current Outpatient Rx  Name  Route  Sig  Dispense  Refill  . citalopram (CELEXA) 40 MG tablet   Oral   Take 1 tablet (40 mg total) by mouth daily. (mental health)   90 tablet   2   . diltiazem (CARTIA XT) 120 MG 24 hr capsule   Oral   Take 1 capsule (120 mg total) by mouth daily.   90 capsule   1   . gabapentin (NEURONTIN) 300 MG capsule   Oral   Take 1-2 capsules (300-600 mg total) by mouth 3 (three) times daily.   180 capsule   1   . hydrochlorothiazide (HYDRODIURIL) 25 MG tablet   Oral   Take 1 tablet (25 mg total) by mouth daily.   90 tablet   3   . LORazepam (ATIVAN) 1 MG tablet   Oral   Take 1 tablet (1 mg total) by mouth 2 (two) times daily as needed for anxiety.   10 tablet   0   . meloxicam (MOBIC) 15 MG tablet   Oral   Take 1 tablet (15 mg total) by mouth daily.   30 tablet   0   . metFORMIN (GLUCOPHAGE) 500 MG tablet   Oral   Take 1 tablet (500 mg total) by mouth 2 (two) times daily with a meal.   180 tablet   3   . Multiple Vitamins-Minerals (WOMENS DAILY FORMULA PO)   Oral   Take 1 tablet by mouth daily.         Marland Kitchen omeprazole (PRILOSEC) 20 MG capsule  Oral   Take 1 capsule (20 mg total) by mouth daily.   30 capsule   3   . polyethylene glycol (MIRALAX / GLYCOLAX) packet   Oral   Take 17 g by mouth daily as needed (CONSTIPATION).         Marland Kitchen traZODone (DESYREL) 150 MG tablet   Oral   Take 1 tablet (150 mg total) by mouth at bedtime.   30 tablet   3     BP 129/68  Pulse 73  Temp(Src) 98 F (36.7 C)  Resp 20  SpO2 100%  Physical Exam  Constitutional: She is oriented to person, place, and time. She appears well-developed and well-nourished.  HENT:  Head: Normocephalic.  Neck: Normal range of motion. Neck supple.  Cardiovascular: Normal rate and regular rhythm.   No murmur heard. Pulmonary/Chest: Effort normal and breath sounds normal.  Abdominal: Soft. Bowel sounds are normal. There is no tenderness. There is no rebound and no guarding.  Genitourinary:  No CVA tenderness.   Musculoskeletal: Normal range of motion.        Arms: Tenderness without swelling or discoloration.  Neurological: She is alert and oriented to person, place, and time.  Skin: Skin is warm and dry. No rash noted.  Psychiatric: She has a normal mood and affect.    ED Course  Procedures (including critical care time)  Labs Reviewed  URINALYSIS, ROUTINE W REFLEX MICROSCOPIC - Abnormal; Notable for the following:    Leukocytes, UA SMALL (*)    All other components within normal limits  URINE MICROSCOPIC-ADD ON - Abnormal; Notable for the following:    Squamous Epithelial / LPF FEW (*)    All other components within normal limits   Dg Chest 2 View  09/02/2012   *RADIOLOGY REPORT*  Clinical Data: Flank pain  CHEST - 2 VIEW  Comparison: 11/02/2011  Findings: The heart size and mediastinal contours are within normal limits.  Both lungs are clear.  The visualized skeletal structures are remarkable for multilevel degenerative disc disease identified.  IMPRESSION: Negative exam.   Original Report Authenticated By: Signa Kell, M.D.     No diagnosis found.  1. Musculoskeletal pain   MDM  Pain is reproducible. No evidence UTI and no flank pain, no abdominal pain, N, V adn no cough, respiratory symptoms with normal chest x-ray. Feel symptoms are musculoskeletal in nature. Will add Flexeril, encourage continuation of usual pain medications. Follow up with PCP next week.         Arnoldo Hooker, PA-C 09/02/12 2231

## 2012-09-02 NOTE — ED Notes (Signed)
Pt c/o right sided flank pain x 1 week, pt went to family practice and had UA for questionable kidney stone but states negative, pt states pain is worse

## 2012-09-08 ENCOUNTER — Telehealth: Payer: Self-pay | Admitting: Family Medicine

## 2012-09-08 ENCOUNTER — Other Ambulatory Visit: Payer: Self-pay | Admitting: Family Medicine

## 2012-09-08 MED ORDER — IBUPROFEN 800 MG PO TABS: 800.0000 mg | ORAL_TABLET | Freq: Three times a day (TID) | ORAL | Status: DC | PRN

## 2012-09-08 NOTE — Telephone Encounter (Signed)
Called patient because I saw a refill request for ibuprofen.  She came in last week and was prescribed mobic and ativan. She is having low back pain and swelling. She is applying heat to her back. Advised ice instead of heat. She may take mobic or ibuprofen but not both. She opted for mobic.

## 2012-09-08 NOTE — Telephone Encounter (Signed)
Called patient. She is having low back pain and swelling. Prefers ibuprofen to mobic. Faxed [facility]up[facility]fen to [facility].

## 2012-09-12 ENCOUNTER — Telehealth: Payer: Self-pay | Admitting: Family Medicine

## 2012-09-12 NOTE — Telephone Encounter (Signed)
Received refill request for motrin 600 mg from [facility]. I see Dr. Armen Pickup [facility]ed motrin [facility] few days ago. Please clarify with patient to make sure she has gotten [facility] rx, that she is not taking duplicate motrin tablets. I will send to [facility] if she needs new rx.

## 2012-09-12 NOTE — Telephone Encounter (Signed)
Pt prefers the Motrin 800mg .  Instructed patient that was called to [facility]. Request for Motrin 600 mg was denied at [facility].  Karma Hiney, [name], CMA

## 2012-09-18 ENCOUNTER — Telehealth: Payer: Self-pay | Admitting: Family Medicine

## 2012-09-18 NOTE — Telephone Encounter (Signed)
I agree to placing a referral to local ortho and neurosurgery. Patient will not need office visit.

## 2012-09-18 NOTE — Telephone Encounter (Signed)
Has received medicaid. Can she now have referral for second opinion for lower back and shoulder? \please advise

## 2012-09-18 NOTE — Telephone Encounter (Signed)
Before I call and have her bring the card up here for [place] to scan, will forward to MD to see if she is willing to refer for a 2nd opinion.  Pt should be made aware that even though MD may refer her does not mean that another office will agree to do a 2nd opinion. Olvin Rohr, [name]

## 2012-09-18 NOTE — Telephone Encounter (Signed)
Spoke with patient.  She has received the approval letter for Medicaid but has not yet received the Medicaid card.  I instructed patient once she receives the actual MCD card, to bring to our office to have it copied and entered into our system.  We need to verify it has our name on it.  I instructed patient, that once that was done,  She could make an appt to be seen and discuss referral.  Pt verbalized understanding.  Neal Trulson, [name], CMA

## 2012-09-19 NOTE — Telephone Encounter (Signed)
Called pt and let her know that we will need her card before we can call for referral.  Pt is aware and will bring up here at her convenience.

## 2012-09-21 ENCOUNTER — Encounter (HOSPITAL_COMMUNITY): Payer: Self-pay | Admitting: Emergency Medicine

## 2012-09-21 ENCOUNTER — Emergency Department (HOSPITAL_COMMUNITY)
Admission: EM | Admit: 2012-09-21 | Discharge: 2012-09-21 | Disposition: A | Discharge status: Home / Self Care | Payer: Medicaid Other | Attending: Emergency Medicine | Admitting: Emergency Medicine

## 2012-09-21 ENCOUNTER — Emergency Department (HOSPITAL_COMMUNITY): Payer: Medicaid Other

## 2012-09-21 DIAGNOSIS — I471 Supraventricular tachycardia, unspecified: Secondary | ICD-10-CM | POA: Insufficient documentation

## 2012-09-21 DIAGNOSIS — R002 Palpitations: Secondary | ICD-10-CM | POA: Insufficient documentation

## 2012-09-21 DIAGNOSIS — R42 Dizziness and giddiness: Secondary | ICD-10-CM | POA: Insufficient documentation

## 2012-09-21 DIAGNOSIS — R61 Generalized hyperhidrosis: Secondary | ICD-10-CM | POA: Insufficient documentation

## 2012-09-21 DIAGNOSIS — Z8719 Personal history of other diseases of the digestive system: Secondary | ICD-10-CM | POA: Insufficient documentation

## 2012-09-21 DIAGNOSIS — Z3202 Encounter for pregnancy test, result negative: Secondary | ICD-10-CM | POA: Insufficient documentation

## 2012-09-21 DIAGNOSIS — Z79899 Other long term (current) drug therapy: Secondary | ICD-10-CM | POA: Insufficient documentation

## 2012-09-21 DIAGNOSIS — I10 Essential (primary) hypertension: Secondary | ICD-10-CM | POA: Insufficient documentation

## 2012-09-21 LAB — TROPONIN I
Troponin I: <0.3 ng/mL (ref <0.30)
Troponin I: <0.3 ng/mL (ref <0.30)

## 2012-09-21 LAB — CBC WITH DIFFERENTIAL/PLATELET
HCT: 39.2 % (ref 36.0–46.0)
Hemoglobin: 12.8 g/dL (ref 12.0–15.0)
Lymphs Abs: 2.9 10*3/uL (ref 0.7–4.0)
MCH: 27.9 pg (ref 26.0–34.0)
Monocytes Relative: 8 % (ref 3–12)
Neutro Abs: 2.3 10*3/uL (ref 1.7–7.7)
Neutrophils Relative %: 40 % — ABNORMAL LOW (ref 43–77)
RBC: 4.59 MIL/uL (ref 3.87–5.11)

## 2012-09-21 LAB — COMPREHENSIVE METABOLIC PANEL
Alkaline Phosphatase: 72 U/L (ref 39–117)
BUN: 12 mg/dL (ref 6–23)
Chloride: 102 mEq/L (ref 96–112)
GFR calc Af Amer: >90 mL/min (ref >90)
GFR calc non Af Amer: 79 mL/min — ABNORMAL LOW (ref >90)
Glucose, Bld: 136 mg/dL — ABNORMAL HIGH (ref 70–99)
Potassium: 3.4 mEq/L — ABNORMAL LOW (ref 3.5–5.1)
Total Bilirubin: 0.3 mg/dL (ref 0.3–1.2)

## 2012-09-21 LAB — URINALYSIS, ROUTINE W REFLEX MICROSCOPIC
Bilirubin Urine: NEGATIVE
Hgb urine dipstick: NEGATIVE
Protein, ur: NEGATIVE mg/dL
Urobilinogen, UA: 0.2 mg/dL (ref 0.0–1.0)

## 2012-09-21 LAB — PRO B NATRIURETIC PEPTIDE: Pro B Natriuretic peptide (BNP): 81.3 pg/mL (ref 0–125)

## 2012-09-21 MED ORDER — SODIUM CHLORIDE 0.9 % IV BOLUS (SEPSIS)
1000.0000 mL | Freq: Once | INTRAVENOUS | Status: AC
  Administered 2012-09-21: 1000 mL via INTRAVENOUS

## 2012-09-21 NOTE — ED Notes (Signed)
Pt tolerating sprite very well.

## 2012-09-21 NOTE — ED Provider Notes (Signed)
History     CSN: [id number]  Arrival date & time 09/21/12  1412   First MD Initiated Contact with Patient 09/21/12 1501      Chief Complaint  Patient presents with  . Tachycardia  . Headache    (Consider location/radiation/quality/duration/timing/severity/associated sxs/prior treatment) HPI Comments: Patient awoke around 11 am with palpitations, sweating and dizziness. States felt similar to SVT she has had in the past.  It was associated with chest tightness.  No SOB.  Feeling better now, symptoms lasted about 1 hour.  Denies any leg pain or swelling.  States last stress test was about 5 years ago.  Is denies any cardiac history. Denies having any MIs. She seen Dr. Algie Coffer in the past for SVT.  States she has been eating and drinking well today.  Contrary to triage note, she denies headache.  The history is provided by the patient.    Past Medical History  Diagnosis Date  . Hypertension   . Acid reflux   . Tachyarrhythmia   . Left shoulder pain   . Back pain   . Trigger point of thoracic region 03/22/2012    History reviewed. No pertinent past surgical history.  No family history on file.  History  Substance Use Topics  . Smoking status: Never Smoker   . Smokeless tobacco: Never Used  . Alcohol Use: Yes     Comment: occasionally    OB History   Grav Para Term Preterm Abortions TAB SAB Ect Mult Living                  Review of Systems  Constitutional: Negative for fever, activity change and appetite change.  HENT: Negative for congestion and rhinorrhea.   Respiratory: Positive for chest tightness. Negative for cough and shortness of breath.   Cardiovascular: Positive for palpitations. Negative for chest pain and leg swelling.  Gastrointestinal: Negative for nausea, vomiting and abdominal pain.  Genitourinary: Negative for dysuria, vaginal bleeding and vaginal discharge.  Musculoskeletal: Negative for back pain.  Neurological: Positive for dizziness and  light-headedness. Negative for weakness.  A complete 10 system review of systems was obtained and all systems are negative except as noted in the HPI and PMH.    Allergies  Nortriptyline and Prednisone  Home Medications   Current Outpatient Rx  Name  Route  Sig  Dispense  Refill  . citalopram (CELEXA) 40 MG tablet   Oral   Take 1 tablet (40 mg total) by mouth daily. (mental health)   90 tablet   2   . cyclobenzaprine (FLEXERIL) 10 MG tablet   Oral   Take 1 tablet (10 mg total) by mouth 3 (three) times daily as needed for muscle spasms.   20 tablet   0   . diltiazem (CARTIA XT) 120 MG 24 hr capsule   Oral   Take 1 capsule (120 mg total) by mouth daily.   90 capsule   1   . fish oil-omega-3 fatty acids 1000 MG capsule   Oral   Take 2 g by mouth daily.         Marland Kitchen gabapentin (NEURONTIN) 300 MG capsule   Oral   Take 1-2 capsules (300-600 mg total) by mouth 3 (three) times daily.   180 capsule   1   . hydrochlorothiazide (HYDRODIURIL) 25 MG tablet   Oral   Take 1 tablet (25 mg total) by mouth daily.   90 tablet   3   . metFORMIN (GLUCOPHAGE) 500 MG  tablet   Oral   Take 1 tablet (500 mg total) by mouth 2 (two) times daily with a meal.   180 tablet   3   . traZODone (DESYREL) 150 MG tablet   Oral   Take 1 tablet (150 mg total) by mouth at bedtime.   30 tablet   3   . LORazepam (ATIVAN) 1 MG tablet   Oral   Take 1 tablet (1 mg total) by mouth 2 (two) times daily as needed for anxiety.   10 tablet   0   . polyethylene glycol (MIRALAX / GLYCOLAX) packet   Oral   Take 17 g by mouth daily as needed (CONSTIPATION).           BP 138/72  Pulse 93  Temp(Src) 97 F (36.1 C) (Oral)  Resp 20  SpO2 97%  Physical Exam  Constitutional: She is oriented to person, place, and time. She appears well-developed and well-nourished. No distress.  HENT:  Head: Normocephalic and atraumatic.  Mouth/Throat: Oropharynx is clear and moist. No oropharyngeal exudate.   Eyes: Conjunctivae and EOM are normal. Pupils are equal, round, and reactive to light.  Neck: Normal range of motion. Neck supple.  No meningismus  Cardiovascular: Normal rate, regular rhythm and normal heart sounds.   No murmur heard. Pulmonary/Chest: Effort normal and breath sounds normal. No respiratory distress.  Abdominal: Soft. There is no tenderness. There is no rebound and no guarding.  Musculoskeletal: Normal range of motion. She exhibits no edema and no tenderness.  Neurological: She is alert and oriented to person, place, and time. No cranial nerve deficit. She exhibits normal muscle tone. Coordination normal.  CN 2-12 intact, no ataxia on finger to nose, no nystagmus, 5/5 strength throughout, no pronator drift, Romberg negative, normal gait.   Skin: Skin is warm.    ED Course  Procedures (including critical care time)  Labs Reviewed  CBC WITH DIFFERENTIAL - Abnormal; Notable for the following:    Neutrophils Relative % 40 (*)    Lymphocytes Relative 50 (*)    All other components within normal limits  COMPREHENSIVE METABOLIC PANEL - Abnormal; Notable for the following:    Potassium 3.4 (*)    Glucose, Bld 136 (*)    Albumin 3.4 (*)    GFR calc non Af Amer 79 (*)    All other components within normal limits  URINALYSIS, ROUTINE W REFLEX MICROSCOPIC - Abnormal; Notable for the following:    Leukocytes, UA SMALL (*)    All other components within normal limits  TROPONIN I  D-DIMER, QUANTITATIVE  PREGNANCY, URINE  PRO B NATRIURETIC PEPTIDE  URINE MICROSCOPIC-ADD ON  TROPONIN I   Dg Chest 2 View  09/21/2012   *RADIOLOGY REPORT*  Clinical Data: Heart palpitations  CHEST - 2 VIEW  Comparison: Prior chest x-ray 09/02/2012  Findings: The lungs are well-aerated and free from pulmonary edema, focal airspace consolidation or pulmonary nodule.  Cardiac and mediastinal contours are within normal limits.  No pneumothorax, or pleural effusion. No acute osseous findings.   IMPRESSION:  No acute cardiopulmonary disease.   Original Report Authenticated By: Malachy Moan, M.D.     1. Palpitations       MDM  Episode of palpitations with diaphoresis, nausea, dizziness, resolved after 1 hour.  No SOB. Some chest tightness which has resolved.  Sinus tachycardia on EKG. Nonfocal neuro exam. History and exam not consistent with TIA or CVA. Troponin negative, d-dimer negative.  Question whether patient's symptoms were due  to SVT.  No arrhythmias in ED. Cannot rule out ACS.  Will obtain 2nd troponin.   Dr. Algie Coffer has seen the patient. Last stress test 10 years ago. He recommended admission for stress test which patient declined. He will schedule a stress test for tomorrow morning. She's had no chest pain or shortness of breath in the ED. Delta troponin is negative. D-dimer is negative. Patient has no further symptoms. No chest pain, shortness of breath, diaphoresis, nausea or vomiting.  BP 138/72  Pulse 93  Temp(Src) 97 F (36.1 C) (Oral)  Resp 20  SpO2 97%   Date: 09/21/2012  Rate: 101  Rhythm: sinus tachycardia  QRS Axis: normal  Intervals: normal  ST/T Wave abnormalities: normal  Conduction Disutrbances:none  Narrative Interpretation: rate faster  Old EKG Reviewed: changes noted    Glynn Octave, MD 09/22/12 0136

## 2012-09-21 NOTE — ED Notes (Signed)
Norma [name] is an 52 y.o. female.   Chief Complaint: Chest pain HPI: 52 years old female had chest pain, pressure type + sweating spell this AM. Troponin I negative x 2. Also has back and neck pain.  Past Medical History  Diagnosis Date  . Hypertension   . Acid reflux   . Tachyarrhythmia   . Left shoulder pain   . Back pain   . Trigger point of thoracic region 03/22/2012      History reviewed. No pertinent past surgical history. Right bimalleolar fracture with open reduction and internal fixation  Family history: Mom, 44, living and well. Dad died in auto accident age 68. 2 brother one died of pneumonia complication and one living. No sister. . Social History:  reports that she has never smoked. She has never used smokeless tobacco. She reports that  drinks alcohol. She reports that she does not use illicit drugs. She has 3 sons and one daughter  Allergies:  Allergies  Allergen Reactions  . Nortriptyline Rash  . Prednisone Rash    REACTION: rash and itching.      (Not in a hospital admission)  Results for orders placed during the hospital encounter of 09/21/12 (from the past 48 hour(s))  CBC WITH DIFFERENTIAL     Status: Abnormal   Collection Time    09/21/12  3:15 PM      Result Value Range   WBC 5.9  4.0 - 10.5 K/uL   RBC 4.59  3.87 - 5.11 MIL/uL   Hemoglobin 12.8  12.0 - 15.0 g/dL   HCT 16.1  09.6 - 04.5 %   MCV 85.4  78.0 - 100.0 fL   MCH 27.9  26.0 - 34.0 pg   MCHC 32.7  30.0 - 36.0 g/dL   RDW 40.9  81.1 - 91.4 %   Platelets 247  150 - 400 K/uL   Neutrophils Relative % 40 (*) 43 - 77 %   Neutro Abs 2.3  1.7 - 7.7 K/uL   Lymphocytes Relative 50 (*) 12 - 46 %   Lymphs Abs 2.9  0.7 - 4.0 K/uL   Monocytes Relative 8  3 - 12 %   Monocytes Absolute 0.5  0.1 - 1.0 K/uL   Eosinophils Relative 3  0 - 5 %   Eosinophils Absolute 0.2  0.0 - 0.7 K/uL   Basophils Relative 1  0 - 1 %   Basophils Absolute 0.0  0.0 - 0.1 K/uL  COMPREHENSIVE METABOLIC PANEL     Status:  Abnormal   Collection Time    09/21/12  3:15 PM      Result Value Range   Sodium 137  135 - 145 mEq/L   Potassium 3.4 (*) 3.5 - 5.1 mEq/L   Chloride 102  96 - 112 mEq/L   CO2 24  19 - 32 mEq/L   Glucose, Bld 136 (*) 70 - 99 mg/dL   BUN 12  6 - 23 mg/dL   Creatinine, Ser 7.82  0.50 - 1.10 mg/dL   Calcium 9.4  8.4 - 95.6 mg/dL   Total Protein 7.8  6.0 - 8.3 g/dL   Albumin 3.4 (*) 3.5 - 5.2 g/dL   AST 32  0 - 37 U/L   ALT 21  0 - 35 U/L   Alkaline Phosphatase 72  39 - 117 U/L   Total Bilirubin 0.3  0.3 - 1.2 mg/dL   GFR calc non Af Amer 79 (*) >90 mL/min   GFR calc  Af Amer >90  >90 mL/min   Comment:            The eGFR has been calculated     using the CKD EPI equation.     This calculation has not been     validated in all clinical     situations.     eGFR's persistently     <90 mL/min signify     possible Chronic Kidney Disease.  TROPONIN I     Status: None   Collection Time    09/21/12  3:15 PM      Result Value Range   Troponin I <0.30  <0.30 ng/mL   Comment:            Due to the release kinetics of cTnI,     a negative result within the first hours     of the onset of symptoms does not rule out     myocardial infarction with certainty.     If myocardial infarction is still suspected,     repeat the test at appropriate intervals.  D-DIMER, QUANTITATIVE     Status: None   Collection Time    09/21/12  3:15 PM      Result Value Range   D-Dimer, Quant <0.27  0.00 - 0.48 ug/mL-FEU   Comment:            AT THE INHOUSE ESTABLISHED CUTOFF     VALUE OF 0.48 ug/mL FEU,     THIS ASSAY HAS BEEN DOCUMENTED     IN THE LITERATURE TO HAVE     A SENSITIVITY AND NEGATIVE     PREDICTIVE VALUE OF AT LEAST     98 TO 99%.  THE TEST RESULT     SHOULD BE CORRELATED WITH     AN ASSESSMENT OF THE CLINICAL     PROBABILITY OF DVT / VTE.  PRO B NATRIURETIC PEPTIDE     Status: None   Collection Time    09/21/12  3:15 PM      Result Value Range   Pro B Natriuretic peptide (BNP) 81.3   0 - 125 pg/mL  URINALYSIS, ROUTINE W REFLEX MICROSCOPIC     Status: Abnormal   Collection Time    09/21/12  3:37 PM      Result Value Range   Color, Urine YELLOW  YELLOW   APPearance CLEAR  CLEAR   Specific Gravity, Urine 1.018  1.005 - 1.030   pH 5.5  5.0 - 8.0   Glucose, UA NEGATIVE  NEGATIVE mg/dL   Hgb urine dipstick NEGATIVE  NEGATIVE   Bilirubin Urine NEGATIVE  NEGATIVE   Ketones, ur NEGATIVE  NEGATIVE mg/dL   Protein, ur NEGATIVE  NEGATIVE mg/dL   Urobilinogen, UA 0.2  0.0 - 1.0 mg/dL   Nitrite NEGATIVE  NEGATIVE   Leukocytes, UA SMALL (*) NEGATIVE  PREGNANCY, URINE     Status: None   Collection Time    09/21/12  3:37 PM      Result Value Range   Preg Test, Ur NEGATIVE  NEGATIVE   Comment:            THE SENSITIVITY OF THIS     METHODOLOGY IS >20 mIU/mL.  URINE MICROSCOPIC-ADD ON     Status: None   Collection Time    09/21/12  3:37 PM      Result Value Range   Squamous Epithelial / LPF RARE  RARE   WBC, UA 3-6  <3 WBC/hpf  TROPONIN I     Status: None   Collection Time    09/21/12  5:47 PM      Result Value Range   Troponin I <0.30  <0.30 ng/mL   Comment:            Due to the release kinetics of cTnI,     a negative result within the first hours     of the onset of symptoms does not rule out     myocardial infarction with certainty.     If myocardial infarction is still suspected,     repeat the test at appropriate intervals.   Dg Chest 2 View  09/21/2012   *RADIOLOGY REPORT*  Clinical Data: Heart palpitations  CHEST - 2 VIEW  Comparison: Prior chest x-ray 09/02/2012  Findings: The lungs are well-aerated and free from pulmonary edema, focal airspace consolidation or pulmonary nodule.  Cardiac and mediastinal contours are within normal limits.  No pneumothorax, or pleural effusion. No acute osseous findings.  IMPRESSION:  No acute cardiopulmonary disease.   Original Report Authenticated By: Malachy Moan, M.D.    @ROS @ Constitutional: Negative for fever,  activity change and appetite change.  HENT: Negative for congestion and rhinorrhea.  Respiratory: Positive for chest tightness. Negative for cough and shortness of breath.  Cardiovascular: Positive for palpitations. Negative for chest pain and leg swelling.  Gastrointestinal: Negative for nausea, vomiting and abdominal pain.  Genitourinary: Negative for dysuria, vaginal bleeding and vaginal discharge.  Musculoskeletal: Negative for back pain.  Neurological: Positive for dizziness and light-headedness. Negative for weakness  Physical Exam  Blood pressure 125/87, pulse 85, temperature 97 F (36.1 C), temperature source Oral, resp. rate 16, SpO2 96.00%. Constitutional: She is oriented to person, place, and time. She appears well-developed and well-nourished. No distress.  HENT: Normocephalic and atraumatic. Oropharynx is clear and moist. No oropharyngeal exudate. Eyes: Conjunctivae and EOM are normal. Pupils are equal, round, and reactive to light.  Neck: Normal range of motion. Neck supple.  Cardiovascular: Normal rate, regular rhythm and normal heart sounds.  II/VI murmur heard.  Pulmonary/Chest: Effort normal and breath sounds normal. No respiratory distress.  Abdominal: Soft. There is no tenderness. There is no rebound and no guarding.  Musculoskeletal: Normal range of motion. She exhibits no edema and no tenderness.  Neurological: She is alert and oriented to person, place, and time. No cranial nerve deficit. She exhibits normal muscle tone. Coordination normal.  Skin: Skin is warm.   Assessment/Plan Chest pain Hypertension GERD Depression  Nuclear stress test on OP basis.  Keontre Defino S 09/21/2012, 6:44 PM

## 2012-09-21 NOTE — ED Notes (Signed)
Pt states that she has felt an irregular heart beat and has a hx of svt in the past. Alert x4 rate 122,

## 2012-09-22 ENCOUNTER — Encounter (HOSPITAL_COMMUNITY)
Admission: RE | Admit: 2012-09-22 | Discharge: 2012-09-22 | Disposition: A | Discharge status: Home / Self Care | Payer: Medicaid Other | Source: Ambulatory Visit | Attending: Cardiovascular Disease | Admitting: Cardiovascular Disease

## 2012-09-22 ENCOUNTER — Ambulatory Visit: Payer: Self-pay | Admitting: Family Medicine

## 2012-09-22 ENCOUNTER — Other Ambulatory Visit (HOSPITAL_COMMUNITY): Payer: Self-pay | Admitting: Cardiovascular Disease

## 2012-09-22 ENCOUNTER — Other Ambulatory Visit: Payer: Self-pay | Admitting: Cardiovascular Disease

## 2012-09-22 DIAGNOSIS — R079 Chest pain, unspecified: Secondary | ICD-10-CM | POA: Insufficient documentation

## 2012-09-22 MED ORDER — REGADENOSON 0.4 MG/5ML IV SOLN: 0.4000 mg | Freq: Once | INTRAVENOUS | Status: AC

## 2012-09-22 MED ORDER — REGADENOSON 0.4 MG/5ML IV SOLN
INTRAVENOUS | Status: AC
  Administered 2012-09-22: 0.4 mg via INTRAVENOUS
  Filled 2012-09-22: qty 5

## 2012-09-23 ENCOUNTER — Encounter (HOSPITAL_COMMUNITY)
Admission: RE | Admit: 2012-09-23 | Discharge: 2012-09-23 | Disposition: A | Discharge status: Home / Self Care | Payer: Medicaid Other | Source: Ambulatory Visit | Attending: Cardiovascular Disease | Admitting: Cardiovascular Disease

## 2012-09-23 DIAGNOSIS — R079 Chest pain, unspecified: Secondary | ICD-10-CM

## 2012-09-23 MED ORDER — TECHNETIUM TC 99M SESTAMIBI - CARDIOLITE
30.0000 | Freq: Once | INTRAVENOUS | Status: AC | PRN
  Administered 2012-09-23: 11:00:00 30 via INTRAVENOUS

## 2012-09-25 ENCOUNTER — Telehealth: Payer: Self-pay | Admitting: Family Medicine

## 2012-09-25 NOTE — Telephone Encounter (Signed)
Patient called and notified that Dr. Armen Pickup is out of the office until 09/27/2012.  Will call patient with results when MD reviews.  Pt verbalized understanding.   Kelcy Laible, [name], CMA

## 2012-09-25 NOTE — Telephone Encounter (Signed)
Patient is calling for the results of her stress test.

## 2012-09-25 NOTE — Telephone Encounter (Signed)
Will forward to MD.   Kelee Cunningham L, CMA  

## 2012-09-29 ENCOUNTER — Encounter: Payer: Self-pay | Admitting: Family Medicine

## 2012-09-29 ENCOUNTER — Ambulatory Visit (INDEPENDENT_AMBULATORY_CARE_PROVIDER_SITE_OTHER): Payer: Medicaid Other | Admitting: Family Medicine

## 2012-09-29 VITALS — BP 136/81 | HR 109 | Temp 98.3°F | Ht 67.5 in | Wt 248.0 lb

## 2012-09-29 DIAGNOSIS — M79605 Pain in left leg: Secondary | ICD-10-CM

## 2012-09-29 DIAGNOSIS — R079 Chest pain, unspecified: Secondary | ICD-10-CM

## 2012-09-29 DIAGNOSIS — G894 Chronic pain syndrome: Secondary | ICD-10-CM

## 2012-09-29 DIAGNOSIS — M545 Low back pain, unspecified: Secondary | ICD-10-CM

## 2012-09-29 DIAGNOSIS — K089 Disorder of teeth and supporting structures, unspecified: Secondary | ICD-10-CM

## 2012-09-29 DIAGNOSIS — I10 Essential (primary) hypertension: Secondary | ICD-10-CM

## 2012-09-29 MED ORDER — DILTIAZEM HCL ER COATED BEADS 120 MG PO CP24: 120.0000 mg | ORAL_CAPSULE | Freq: Every day | ORAL | Status: DC

## 2012-09-29 MED ORDER — IBUPROFEN 800 MG PO TABS: 800.0000 mg | ORAL_TABLET | Freq: Two times a day (BID) | ORAL | Status: DC | PRN

## 2012-09-29 MED ORDER — OXYCODONE-ACETAMINOPHEN 7.5-500 MG PO TABS: 1.0000 | ORAL_TABLET | ORAL | Status: DC | PRN

## 2012-09-29 NOTE — Telephone Encounter (Signed)
Called patient. Stress test done on 09/22/12 was negative. Left VM.

## 2012-09-29 NOTE — Assessment & Plan Note (Signed)
A: persistent. P: refill pain meds. Has ortho eval next month.

## 2012-09-29 NOTE — Progress Notes (Signed)
Subjective:     Patient ID: Norma [name], female   DOB: 03-12-1961, 52 y.o.   MRN: [id number]  HPI 52 year old female presents for followup:  1. ED follow up: patient seen in the ED on 09/21/2012 with chest pain or palpitations. She had a stress test on 09/23/2012. The nuclear medicine stress is negative. She has not had repeat chest pains or palpitations. She does have some cramping in her left anterior chest.   2. Chronic low back pain: now with bilateral lateral hip pain as well. Patient compliant with Percocet. She has an orthopedic appointment on 10/18/12 Delbert Harness.   3. Poor dentition: request dental referral.  4. Blurred vision: both eyes. Wears reading glasses. Has pre-diabetes.   Review of Systems As per HPI     Objective:   Physical Exam BP 136/81  Pulse 109  Temp(Src) 98.3 F (36.8 C) (Oral)  Ht 5' 7.5" (1.715 m)  Wt 248 lb (112.492 kg)  BMI 38.25 kg/m2 General appearance: alert, cooperative and no distress Lungs: clear to auscultation bilaterally Heart: regular rate and rhythm, S1, S2 normal, no murmur, click, rub or gallop    Assessment and Plan:

## 2012-09-29 NOTE — Assessment & Plan Note (Signed)
Dental referral

## 2012-09-29 NOTE — Patient Instructions (Addendum)
Angelli,  Thank you for coming in today. I will look out for your referral note after your ortho appt.  Since you have prediabetes, medicaid will not cover your eye exam. I recommend that you go to the vision center at [facility] to get assessed. You can also get glasses here.  Dental referral placed.   Dr. Armen Pickup

## 2012-10-02 ENCOUNTER — Telehealth: Payer: Self-pay | Admitting: Family Medicine

## 2012-10-02 ENCOUNTER — Other Ambulatory Visit: Payer: Self-pay | Admitting: *Deleted

## 2012-10-02 DIAGNOSIS — I10 Essential (primary) hypertension: Secondary | ICD-10-CM

## 2012-10-02 MED ORDER — DILTIAZEM HCL ER COATED BEADS 120 MG PO CP24: 120.0000 mg | ORAL_CAPSULE | Freq: Every day | ORAL | Status: DC

## 2012-10-02 NOTE — Telephone Encounter (Signed)
[facility] has told pt that Medicaid will not cover heart medication.  Tilalziam/ Please advise

## 2012-10-02 NOTE — Telephone Encounter (Signed)
Pharmacy will send over prior authorization form and pt notified.  Channon Brougher, [name], CMA

## 2012-10-03 NOTE — Telephone Encounter (Signed)
PAF received  - attached is form but must have failure of 2 preferred meds prior to approval of a nonpreferred. Please see pg 17 of 42 in the Medicaid Drug Book. Any questions please see me - Return form  if filled out to [facility] to be entered into computer. Wyatt Haste, RN-BSN

## 2012-10-04 ENCOUNTER — Telehealth: Payer: Self-pay | Admitting: Family Medicine

## 2012-10-04 NOTE — Telephone Encounter (Signed)
Pt states she has a rash on her face. Wants dr Armen Pickup to call something in. Please advise

## 2012-10-05 NOTE — Telephone Encounter (Signed)
Spoke with patient, she feels rash came on from a new medication she was recently given.  Instructed patient to bring medication and to call front desk to have appt today and not to take the medication.  [name], [name], CMA

## 2012-10-06 ENCOUNTER — Ambulatory Visit (INDEPENDENT_AMBULATORY_CARE_PROVIDER_SITE_OTHER): Payer: Medicaid Other | Admitting: Family Medicine

## 2012-10-06 VITALS — BP 128/63 | HR 89 | Temp 99.1°F | Ht 67.5 in | Wt 252.0 lb

## 2012-10-06 DIAGNOSIS — R21 Rash and other nonspecific skin eruption: Secondary | ICD-10-CM

## 2012-10-06 MED ORDER — HYDROXYZINE HCL 10 MG PO TABS: 10.0000 mg | ORAL_TABLET | Freq: Three times a day (TID) | ORAL | Status: DC | PRN

## 2012-10-06 MED ORDER — DILTIAZEM HCL ER 60 MG PO CP12: 60.0000 mg | ORAL_CAPSULE | Freq: Two times a day (BID) | ORAL | Status: DC

## 2012-10-06 MED ORDER — TRIAMCINOLONE ACETONIDE 0.1 % EX CREA: TOPICAL_CREAM | Freq: Two times a day (BID) | CUTANEOUS | Status: DC

## 2012-10-06 NOTE — Telephone Encounter (Signed)
Change from diltiazem 120 mg extended release daily to 60 mg  single release tablets twice a day.

## 2012-10-06 NOTE — Addendum Note (Signed)
Addended by: Dessa Phi on: 10/06/2012 03:24 PM   Modules accepted: Orders, Medications

## 2012-10-06 NOTE — Patient Instructions (Addendum)
Take the Atarax tonight to help with the itching.  If the throat itching or trouble breathing occurs, call or go to the ED.  Use the Triamcinolone for no longer than 2 weeks.  It can lighten your skin.  Don't take any more of the Soma!

## 2012-10-06 NOTE — Telephone Encounter (Signed)
I agree with this since I do not know what medication she is referring to, and the rash needs to be assessed.

## 2012-10-06 NOTE — Progress Notes (Signed)
Subjective:    Norma [name] is a 52 y.o. female who [facility] for Rash:  1.  Rash:  Patient has been struggling with Left arm pain for several months.  Diagnosed with rotator cuff tear.  States that she has had increasing pain.  Took a Herbalist, which had provided her relief but which caused allergic rash in past, thought it might not return this time.  About 3-4 hours later (which was 2 days ago) she noted increasing Red bumps around her mouth as well as some small hives.  Had itching around mouth and back of throat.  No trouble with airway or breathing.  Rash has persisted today. Still has occasional "itching around mouth". She has tried Benadryl this makes her sleepy. Has not tried anything else around her face. She states that this happened previously when she took Soma and triamcinolone to cause rash to disappear     PMH reviewed.  Past Medical History  Diagnosis Date  . Hypertension   . Acid reflux   . Tachyarrhythmia   . Left shoulder pain   . Back pain   . Trigger point of thoracic region 03/22/2012   No past surgical history on file.  Medications reviewed. Current Outpatient Prescriptions  Medication Sig Dispense Refill  . citalopram (CELEXA) 40 MG tablet Take 1 tablet (40 mg total) by mouth daily. (mental health)  90 tablet  2  . cyclobenzaprine (FLEXERIL) 10 MG tablet Take 1 tablet (10 mg total) by mouth 3 (three) times daily as needed for muscle spasms.  20 tablet  0  . diltiazem (CARDIZEM SR) 60 MG 12 hr capsule Take 1 capsule (60 mg total) by mouth 2 (two) times daily.  180 capsule  3  . fish oil-omega-3 fatty acids 1000 MG capsule Take 2 g by mouth daily.      Marland Kitchen gabapentin (NEURONTIN) 300 MG capsule Take 1-2 capsules (300-600 mg total) by mouth 3 (three) times daily.  180 capsule  1  . hydrochlorothiazide (HYDRODIURIL) 25 MG tablet Take 1 tablet (25 mg total) by mouth daily.  90 tablet  3  . hydrOXYzine (ATARAX/VISTARIL) 10 MG tablet Take 1 tablet (10 mg total)  by mouth 3 (three) times daily as needed for itching.  30 tablet  0  . ibuprofen (ADVIL,MOTRIN) 800 MG tablet Take 1 tablet (800 mg total) by mouth 2 (two) times daily as needed for pain.  60 tablet  3  . LORazepam (ATIVAN) 1 MG tablet Take 1 tablet (1 mg total) by mouth 2 (two) times daily as needed for anxiety.  10 tablet  0  . metFORMIN (GLUCOPHAGE) 500 MG tablet Take 1 tablet (500 mg total) by mouth 2 (two) times daily with a meal.  180 tablet  3  . oxyCODONE-acetaminophen (PERCOCET) 7.5-500 MG per tablet Take 1 tablet by mouth every 4 (four) hours as needed for pain.  120 tablet  0  . polyethylene glycol (MIRALAX / GLYCOLAX) packet Take 17 g by mouth daily as needed (CONSTIPATION).      Marland Kitchen traZODone (DESYREL) 150 MG tablet Take 1 tablet (150 mg total) by mouth at bedtime.  30 tablet  3  . triamcinolone cream (KENALOG) 0.1 % Apply topically 2 (two) times daily.  45 g  0   No current facility-administered medications for this visit.    ROS as above otherwise neg.    Objective:   Physical Exam BP 128/63  Pulse 89  Temp(Src) 99.1 F (37.3 C) (Oral)  Ht 5' 7.5" (1.715 m)  Wt 252 lb (114.306 kg)  BMI 38.86 kg/m2 Gen:  Alert, cooperative patient who appears stated age in no acute distress.  Vital signs reviewed. HEENT: EOMI,  MMM. No angioedema noted. Posterior oropharynx not erythematous nonedematous. Skin: Multiple papules and macules scattered around mouth. Some urticaria.   No results found for this or any previous visit (from the past 72 hour(s)).

## 2012-10-07 DIAGNOSIS — R21 Rash and other nonspecific skin eruption: Secondary | ICD-10-CM | POA: Insufficient documentation

## 2012-10-07 NOTE — Assessment & Plan Note (Signed)
Allergic reaction, likely to [facility]. Treat with short term course of Triamcinolone Provided Atarax if any further itching occurs. Strongly warned patient about no further Soma and dangers of angioedema/anaphylaxis. Added this to Allergies list

## 2012-10-10 ENCOUNTER — Encounter: Payer: Self-pay | Admitting: Family Medicine

## 2012-10-10 NOTE — Telephone Encounter (Signed)
Error

## 2012-10-11 ENCOUNTER — Telehealth: Payer: Self-pay | Admitting: Family Medicine

## 2012-10-11 NOTE — Telephone Encounter (Signed)
Bridgett from Disability called wanting additional medical records faxed to her. She needs MRI and other X-rays sent. [phone] atten: Nita Sells

## 2012-10-11 NOTE — Telephone Encounter (Signed)
I will not fax patient records without a release form filled out.  No number left to call, only a fax number.  Nathanial Arrighi, [name], CMA

## 2012-10-13 ENCOUNTER — Ambulatory Visit (INDEPENDENT_AMBULATORY_CARE_PROVIDER_SITE_OTHER): Payer: Medicaid Other | Admitting: Family Medicine

## 2012-10-13 ENCOUNTER — Encounter: Payer: Self-pay | Admitting: Family Medicine

## 2012-10-13 VITALS — BP 130/78 | HR 97 | Temp 98.3°F | Ht 67.0 in | Wt 249.0 lb

## 2012-10-13 DIAGNOSIS — K089 Disorder of teeth and supporting structures, unspecified: Secondary | ICD-10-CM

## 2012-10-13 DIAGNOSIS — G8929 Other chronic pain: Secondary | ICD-10-CM

## 2012-10-13 DIAGNOSIS — G894 Chronic pain syndrome: Secondary | ICD-10-CM

## 2012-10-13 DIAGNOSIS — L600 Ingrowing nail: Secondary | ICD-10-CM

## 2012-10-13 MED ORDER — OXYCODONE-ACETAMINOPHEN 7.5-325 MG PO TABS: 1.0000 | ORAL_TABLET | ORAL | Status: DC | PRN

## 2012-10-13 NOTE — Patient Instructions (Addendum)
Norma [name],   Thank you for coming in today.  I have placed a referral to podiatry for toenails.  Please call to one of the dental office to schedule exam.  I recommend that you reschedule your eye exam.  I have changed your Percocet to 7.5/325 at your request.  Dr. Armen Pickup

## 2012-10-13 NOTE — Assessment & Plan Note (Signed)
A: dental pain w/o abscess.  P: patient provided with list of dentist who take medicaid.

## 2012-10-13 NOTE — Progress Notes (Signed)
Subjective:     Patient ID: Norma [name], female   DOB: 1960-12-06, 52 y.o.   MRN: [id number]  HPI 52 year old  female presents for followup to discuss the following:  #1 bilateral great toe pain: Patient has a history of right ingrown toenail that was removed last year and has regrown. She now has pain in the lateral aspect of both toes. The pain is associated with tenderness and sometimes redness and swelling. There is no redness or swelling currently. The pain is exacerbated by standing and wearing shoes. The pain is relieved by going barefoot. The pain is described as moderate in severity and throbbing in sensation.   #2 left-sided cerebral her pain: Patient with poor dentition no dental home. She has pain in her left inferior molar which eats sweets. There is no associated swelling or fever. No pain currently.   Medications: Patient requested her Percocet dose be changed from 7.5/ 500 to 7.5/325.   Review of Systems As per HPI     Objective:   Physical Exam BP 130/78  Pulse 97  Temp(Src) 98.3 F (36.8 C) (Oral)  Ht 5\' 7"  (1.702 m)  Wt 249 lb (112.946 kg)  BMI 38.99 kg/m2 General appearance: alert, cooperative and no distress Throat: oropharynx clear, poor dentition. L mandibular molar, filled with silver filling. no tednerness. no redenss or swelling in gum or jaw.  Extremities: normal foot exam. Mild TTP R medial great toenail and L lateral w/o redness, swelling or drainage. Nail is thick but not significantly ingrown.     Assessment:

## 2012-10-13 NOTE — Assessment & Plan Note (Signed)
Painful. Thick nail.  No redness, swelling or drainage. Advised patient continue soaks. Schedule lateral toenail removal if pain persist or worsens.  Referral to podiatry.  Patient not willing to have nail removed in office following removal and regrowth of R great toenail last year.

## 2012-10-16 ENCOUNTER — Telehealth: Payer: Self-pay | Admitting: Family Medicine

## 2012-10-16 NOTE — Telephone Encounter (Signed)
Pt says she has problem refilling her prescription for ? Says she will discuss the prescription with dr Armen Pickup when she calls Please advise

## 2012-10-16 NOTE — Telephone Encounter (Signed)
I called patient and asked pt what I could help her with.  She said "I need to talk to Dr. Armen Pickup"  I told pt that Dr. Armen Pickup was not in the office but I could try to help her.  Pt states "No, you can't" and hung up on me.  Bobetta Korf, [name], CMA

## 2012-10-16 NOTE — Telephone Encounter (Signed)
Pt reports needing percocet filled TODAY. " I will just have to go on over to the ER if I can't make it. They say that I can't get it filled until Wednesday" Pt last filled prescription on June 4th, note on prescription that states not to fill until 7/2 - explained to pt that refills may only be filled every 28 days and that Dr. Armen Pickup was unavailable "Well I will be all right, I will just go to the ER" Encouraged to call office with any further needs. Pt verbalized understanding.  Wyatt Haste, RN-BSN

## 2012-10-17 NOTE — Telephone Encounter (Signed)
Called patient. She will get percocet refilled tomorrow.   She has ortho appt coming up.  I informed patient that I need her to be respectful to my clinic staff and work patiently with them, just as I need the same from them towards her. She admits to being in increased pain and being short with people lately and apologizes.  I also informed her that she had been re-assigned to Dr. Karie Schwalbe. She expressed concern with being re-assigned. She fears that a new PCP will not be sensitive and caring regarding her pain. I assured her that Dr. Karie Schwalbe is very compassionate. I reminded her that the nature of a residency practice is turnover likely every 2-3 years. I asked if she would be interested in pain management. She declines.

## 2012-10-18 ENCOUNTER — Telehealth: Payer: Self-pay | Admitting: Family Medicine

## 2012-10-18 NOTE — Telephone Encounter (Signed)
Patient had a appt scheduled at [facility] for today, but when she got to the appt, they told her that she missed her appt.  It was scheduled for sometime in June.  The rescheduled her for later in July, but she is going to have to continue with this severe back pain until then and this was not her fault.  She is hoping that Lupita Leash can get her in sooner.

## 2012-10-18 NOTE — Telephone Encounter (Signed)
Appointment scheduled for 10/19/2012 @ 10:15am with Dr. Lajoyce Corners at [facility].  Deliyah notified.  Ileana Ladd

## 2012-10-19 ENCOUNTER — Ambulatory Visit: Payer: Medicaid Other | Admitting: Family Medicine

## 2012-10-23 ENCOUNTER — Ambulatory Visit: Payer: Medicaid Other | Admitting: Family Medicine

## 2012-10-23 ENCOUNTER — Encounter: Payer: Self-pay | Admitting: Family Medicine

## 2012-10-24 ENCOUNTER — Telehealth: Payer: Self-pay | Admitting: Family Medicine

## 2012-10-24 NOTE — Telephone Encounter (Signed)
Bridgett from Disability called wanting additional medical records faxed to her. Her number is [phone]. She says she has a pt consent already. JW

## 2012-10-24 NOTE — Telephone Encounter (Signed)
LMOVM for Bridgette to call back.  Please find out what additional information is needed and forward this message to the front office staff whom faxes medical records. Maximina Pirozzi, [name]

## 2012-10-25 ENCOUNTER — Encounter: Payer: Self-pay | Admitting: Family Medicine

## 2012-10-25 ENCOUNTER — Telehealth: Payer: Self-pay | Admitting: Family Medicine

## 2012-10-25 ENCOUNTER — Encounter: Payer: Medicaid Other | Admitting: Family Medicine

## 2012-10-26 NOTE — Progress Notes (Signed)
  This encounter was created in error - please disregard.  Patient and cancelled appointment as she had another engagement.

## 2012-10-27 ENCOUNTER — Ambulatory Visit: Payer: Medicaid Other | Admitting: Family Medicine

## 2012-10-27 ENCOUNTER — Telehealth: Payer: Self-pay | Admitting: Family Medicine

## 2012-11-01 NOTE — Telephone Encounter (Signed)
error 

## 2012-11-02 ENCOUNTER — Encounter: Payer: Self-pay | Admitting: Family Medicine

## 2012-11-02 ENCOUNTER — Ambulatory Visit (INDEPENDENT_AMBULATORY_CARE_PROVIDER_SITE_OTHER): Payer: Medicaid Other | Admitting: Family Medicine

## 2012-11-02 VITALS — BP 149/86 | HR 71 | Temp 98.1°F | Ht 67.0 in | Wt 249.0 lb

## 2012-11-02 DIAGNOSIS — I10 Essential (primary) hypertension: Secondary | ICD-10-CM

## 2012-11-02 DIAGNOSIS — F339 Major depressive disorder, recurrent, unspecified: Secondary | ICD-10-CM

## 2012-11-02 DIAGNOSIS — R21 Rash and other nonspecific skin eruption: Secondary | ICD-10-CM

## 2012-11-02 DIAGNOSIS — G894 Chronic pain syndrome: Secondary | ICD-10-CM

## 2012-11-02 MED ORDER — CITALOPRAM HYDROBROMIDE 40 MG PO TABS: 40.0000 mg | ORAL_TABLET | Freq: Every day | ORAL | Status: DC

## 2012-11-02 MED ORDER — EPINEPHRINE 0.3 MG/0.3ML IJ SOAJ: 0.3000 mg | Freq: Once | INTRAMUSCULAR | Status: DC

## 2012-11-02 MED ORDER — OXYCODONE-ACETAMINOPHEN 7.5-325 MG PO TABS
1.0000 | ORAL_TABLET | Freq: Four times a day (QID) | ORAL | Status: DC | PRN
Start: 2012-11-02 — End: 2013-02-06

## 2012-11-02 MED ORDER — DILTIAZEM HCL ER 60 MG PO CP12: 60.0000 mg | ORAL_CAPSULE | Freq: Two times a day (BID) | ORAL | Status: DC

## 2012-11-02 MED ORDER — HYDROXYZINE HCL 10 MG PO TABS: 10.0000 mg | ORAL_TABLET | Freq: Three times a day (TID) | ORAL | Status: DC | PRN

## 2012-11-02 MED ORDER — OMEPRAZOLE 20 MG PO CPDR: 20.0000 mg | DELAYED_RELEASE_CAPSULE | Freq: Every day | ORAL | Status: DC

## 2012-11-02 NOTE — Patient Instructions (Addendum)
It was good to see you today.  For your medications, we refilled celexa, cardizem, atarax. I restarted omeprazole. I also refilled percocet.  For your itching, Continue metformin twice daily instead of all at once. I have prescribed an epipen in the event that your throat swells up. Since your symptoms are improving, I am hoping you don't need this. If you do, use it and call 911.  Follow up with me in 1-2 months to see how your itching is doing and follow-up on health maintenance.  Dr. Benjamin Stain

## 2012-11-02 NOTE — Progress Notes (Signed)
Patient ID: Norma [name], female   DOB: July 28, 1960, 52 y.o.   MRN: [id number] Subjective:   CC: Itching/rash  HPI: Norma [name] is a 52 y.o. female here for sameday appt for a rash.  1. Rash - Per patient, she started itching when she started metformin > 1 month ago. She and previous provider slowly stopped meds to see what was causing the rash. Recently, she spaced out metformin to BID, and since then, rash has been getting less itchy, though is still present. Rash started on arms as itchy and bumpy skin, then spread to breasts and has recently started on ankles. She does not notice a pattern other than with metformin. She is not taking Soma and still has the rash, so does not think it was from soma. Triamcinolone cream helps with arms. Atarax also helps and she would like a refill. She reports some itching in throat but denies episodes where she could not breathe.  She denies fevers, chills, nausea, vomiting, diarrhea, abdominal pain, bug bite, household contacts with rash, seeing bugs in bed.   2. Back pain - chronic, unchanged. Requesting percocet refill. She states she takes 1 pill 2-3 times daily and gets refills monthly of 120 tablets. She goes to surgeon today to see if she will need back surgery and is hoping she does not. Rx recently filled 6/27 for 120 tablets. Reports none from pain doctor. Thinks she signed a pain contract within the year but unable to tell me when.   3. Requesting refills on all medications, denies side effects from any other than metformin per above if she takes 2 pills at once.  Review of Systems - Per HPI.   SH: - Lives with son and an 75 year old lady. - Has an outdoor dog.    Objective:  Physical Exam BP 149/86  Pulse 71  Temp(Src) 98.1 F (36.7 C) (Oral)  Ht 5\' 7"  (1.702 m)  Wt 249 lb (112.946 kg)  BMI 38.99 kg/m2 GEN: NAD, texting on cell phone PULM: Normal effort NEURO: Normal speech and gait, no focal deficit, awake and alert PSYCH: Mood and  affect euthymic SKIN: generalized fine maculopapular mildly erythematous rash on dorsal surface of arms with no exudate or excoriations, no vesicles     Assessment:     Norma [name] is a 52 y.o. female here for sameday appt for a rash, also requesting med refills.    Plan:     # See problem list for problem-specific plans. - Meds reviewed and refilled celexa, cardizem, and omeprazole per pt request - Pt did not know if she is taking ativan - f/u at next visit and urged to bring all med bottles

## 2012-11-03 ENCOUNTER — Telehealth: Payer: Self-pay | Admitting: *Deleted

## 2012-11-03 NOTE — Telephone Encounter (Signed)
Pt reports being stung by a wasp and would like to know what to do for it . Recommended ice, benadryl and motrin. Go to UC or ED for SOB, swelling, facial edema or throat edema. Pt verbalized understanding. Wyatt Haste, RN-BSN

## 2012-11-04 NOTE — Assessment & Plan Note (Signed)
Rash improving with not taking soma and spacing out metformin to BID (per pt). LFTs and bilirubin recently tested and normal. - Continue current medicaiton regimen - Refilled atarax and ordered epipen in unlikely event she were to anaphylax to something

## 2012-11-04 NOTE — Assessment & Plan Note (Signed)
On chronic narcotics with pain fairly well-controlled.  - Seeing back surgeon today - Rx for percocet 120 tabs today x 1 month to be filled 8/15 - By next visit, ensure that patient has pain contract signed

## 2012-11-04 NOTE — Assessment & Plan Note (Signed)
Stable per pt.  - Refilled celexa - PHQ-9 at f/u

## 2012-11-04 NOTE — Assessment & Plan Note (Signed)
Stable per pt, though BP elevated today to 149/86 - Refilled cardizem - F/u at next visit

## 2012-11-07 ENCOUNTER — Ambulatory Visit (INDEPENDENT_AMBULATORY_CARE_PROVIDER_SITE_OTHER): Payer: Medicaid Other | Admitting: Family Medicine

## 2012-11-07 ENCOUNTER — Encounter: Payer: Self-pay | Admitting: Family Medicine

## 2012-11-07 VITALS — BP 141/88 | HR 74 | Temp 98.1°F | Wt 249.0 lb

## 2012-11-07 DIAGNOSIS — I10 Essential (primary) hypertension: Secondary | ICD-10-CM

## 2012-11-07 DIAGNOSIS — M549 Dorsalgia, unspecified: Secondary | ICD-10-CM

## 2012-11-07 MED ORDER — CYCLOBENZAPRINE HCL 10 MG PO TABS: 5.0000 mg | ORAL_TABLET | Freq: Three times a day (TID) | ORAL | Status: DC | PRN

## 2012-11-07 NOTE — Addendum Note (Signed)
Addended by: Konrad Dolores, DAVID J on: 11/07/2012 04:05 PM   Modules accepted: Orders

## 2012-11-07 NOTE — Assessment & Plan Note (Addendum)
Acute on chronic secondary to spasm from fall Reviewed previous charts on back pain and last MRI Continue percocet as previously prescribed Out of flexeril. Will Rx as this will likely help Recommending rest, ice/heat, continued NSAIDs, and massage Counseled to reconsider Ortho for injections.  Ativan will likely also benefit as pt already taking  Also discussed TENS unit as a possibility for her back pain in the future

## 2012-11-07 NOTE — Assessment & Plan Note (Addendum)
Slight elevation today likely from pain Cont current regimen

## 2012-11-07 NOTE — Patient Instructions (Addendum)
Thank you for coming in today Your symptoms are likely from muscle spasms from your fall This should improve within a few days of rest, applying heat and ice as needed, stretches, massage, and taking a muscle relaxer (Flexeril) Please follow up with Dr. Karie Schwalbe as needed.   Back Pain, Adult Back pain is very common. The pain often gets better over time. The cause of back pain is usually not dangerous. Most people can learn to manage their back pain on their own.  HOME CARE   Stay active. Start with short walks on flat ground if you can. Try to walk farther each day.  Do not sit, drive, or stand in one place for more than 30 minutes. Do not stay in bed.  Do not avoid exercise or work. Activity can help your back heal faster.  Be careful when you bend or lift an object. Bend at your knees, keep the object close to you, and do not twist.  Sleep on a firm mattress. Lie on your side, and bend your knees. If you lie on your back, put a pillow under your knees.  Only take medicines as told by your doctor.  Put ice on the injured area.  Put ice in a plastic bag.  Place a towel between your skin and the bag.  Leave the ice on for 15-20 minutes, 3-4 times a day for the first 2 to 3 days. After that, you can switch between ice and heat packs.  Ask your doctor about back exercises or massage.  Avoid feeling anxious or stressed. Find good ways to deal with stress, such as exercise. GET HELP RIGHT AWAY IF:   Your pain does not go away with rest or medicine.  Your pain does not go away in 1 week.  You have new problems.  You do not feel well.  The pain spreads into your legs.  You cannot control when you poop (bowel movement) or pee (urinate).  Your arms or legs feel weak or lose feeling (numbness).  You feel sick to your stomach (nauseous) or throw up (vomit).  You have belly (abdominal) pain.  You feel like you may pass out (faint). MAKE SURE YOU:   Understand these  instructions.  Will watch your condition.  Will get help right away if you are not doing well or get worse. Document Released: 09/22/2007 Document Revised: 06/28/2011 Document Reviewed: 08/24/2010 [facility] Patient Information 2014 [place], [place].

## 2012-11-07 NOTE — Progress Notes (Signed)
Norma [name] is a 52 y.o. female who presents to [facility] today for [place] appt for back pain.   Back pain: fell off porch after being stung by bees on Friday. Pain located on R lower back. Pain is described as dull. No radiation. Denies any loss of bowel or bladder fxn. Comes and goes. Feels like a spasm. Worse w/ certain movements. Improves w/ rest. Ibuprofen 800mg  BID w/ minimal relief. Percocet typically w/ relief but not since injury.  Recent orthopedic eval recommended injections but pt deferred.   The following portions of the patient's history were reviewed and updated as appropriate: allergies, current medications, past medical history, family and social history, and problem list.  Patient is a nonsmoker.   Past Medical History  Diagnosis Date  . Hypertension   . Acid reflux   . Tachyarrhythmia   . Left shoulder pain   . Back pain   . Trigger point of thoracic region 03/22/2012    ROS as above otherwise neg.    Medications reviewed. Current Outpatient Prescriptions  Medication Sig Dispense Refill  . citalopram (CELEXA) 40 MG tablet Take 1 tablet (40 mg total) by mouth daily. (mental health)  90 tablet  2  . cyclobenzaprine (FLEXERIL) 10 MG tablet Take 1 tablet (10 mg total) by mouth 3 (three) times daily as needed for muscle spasms.  20 tablet  0  . diltiazem (CARDIZEM SR) 60 MG 12 hr capsule Take 1 capsule (60 mg total) by mouth 2 (two) times daily.  180 capsule  3  . EPINEPHrine (EPIPEN) 0.3 mg/0.3 mL SOAJ Inject 0.3 mLs (0.3 mg total) into the muscle once.  1 Device  1  . fish oil-omega-3 fatty acids 1000 MG capsule Take 2 g by mouth daily.      Marland Kitchen gabapentin (NEURONTIN) 300 MG capsule Take 1-2 capsules (300-600 mg total) by mouth 3 (three) times daily.  180 capsule  1  . hydrochlorothiazide (HYDRODIURIL) 25 MG tablet Take 1 tablet (25 mg total) by mouth daily.  90 tablet  3  . hydrOXYzine (ATARAX/VISTARIL) 10 MG tablet Take 1 tablet (10 mg total) by mouth 3 (three) times daily as  needed for itching.  30 tablet  0  . ibuprofen (ADVIL,MOTRIN) 800 MG tablet Take 1 tablet (800 mg total) by mouth 2 (two) times daily as needed for pain.  60 tablet  3  . LORazepam (ATIVAN) 1 MG tablet Take 1 tablet (1 mg total) by mouth 2 (two) times daily as needed for anxiety.  10 tablet  0  . metFORMIN (GLUCOPHAGE) 500 MG tablet Take 1 tablet (500 mg total) by mouth 2 (two) times daily with a meal.  180 tablet  3  . omeprazole (PRILOSEC) 20 MG capsule Take 1 capsule (20 mg total) by mouth daily.  30 capsule  3  . oxyCODONE-acetaminophen (PERCOCET) 7.5-325 MG per tablet Take 1 tablet by mouth every 6 (six) hours as needed for pain.  120 tablet  0  . polyethylene glycol (MIRALAX / GLYCOLAX) packet Take 17 g by mouth daily as needed (CONSTIPATION).      Marland Kitchen traZODone (DESYREL) 150 MG tablet Take 1 tablet (150 mg total) by mouth at bedtime.  30 tablet  3  . triamcinolone cream (KENALOG) 0.1 % Apply topically 2 (two) times daily.  45 g  0   No current facility-administered medications for this visit.    Exam: BP 141/88  Pulse 74  Temp(Src) 98.1 F (36.7 C) (Oral)  Wt 249 lb (  112.946 kg)  BMI 38.99 kg/m2 Gen: Well NAD HEENT: EOMI,  MMM Lungs: CTABL Nl WOB Heart: RRR no MRG Abd: NABS, NT, ND Exts: Non edematous BL  LE, warm and well perfused.  MSK: point tenderness along the perispinal muscles on the R lower back. No loss of sensation. Walks w/ a cane   No results found for this or any previous visit (from the past 72 hour(s)).

## 2012-11-08 ENCOUNTER — Telehealth: Payer: Self-pay | Admitting: Family Medicine

## 2012-11-08 NOTE — Telephone Encounter (Signed)
Pt states that she never filled rx for lorazepam.  According to OV from May with Dr. Madolyn Frieze, rx was printed at appt.  Pt is unsure if she was handed this when she left.  States that she has since changed pharmacies and they don't have anything on file.  Pt also informed me that she is still breaking out and has tried taking Metformin once in morning and once at night as instructed.  She would like to try taking the lorazepam due to her nerves and not being able to sleep.  Please advise.  Jamielee Mchale,  Gaudencio Chesnut

## 2012-11-08 NOTE — Telephone Encounter (Signed)
Pt is requesting a refill on lorazepam. She said that she had a prescription in May of this year but never had it filled and now would like to try it. Norma [name]

## 2012-11-10 NOTE — Telephone Encounter (Signed)
Unfortunately, it's clinic policy that we do not re-print lost or stolen prescriptions and I just became her provider and have not prescribed this for her. For the back pain after falling after a bee sting, she has flexeril and percocet that she can use for pain control and this may hurt for some time but should get better with time as well. She should continue resting, ice/heat, and return if it is not getting better. After some time if pain is not relieved, I do not want to mask it with benzos. If anxiety is the issue, she needs a visit where this is addressed and also not masked by throwing ativan at it.  Please call and let her know. Thank you.  [name], MD

## 2012-11-13 NOTE — Telephone Encounter (Signed)
appt made for pt to be seen in clinic.

## 2012-11-17 ENCOUNTER — Ambulatory Visit (INDEPENDENT_AMBULATORY_CARE_PROVIDER_SITE_OTHER): Payer: Medicaid Other | Admitting: Family Medicine

## 2012-11-17 ENCOUNTER — Encounter: Payer: Self-pay | Admitting: Family Medicine

## 2012-11-17 VITALS — BP 147/92 | HR 90 | Wt 249.0 lb

## 2012-11-17 DIAGNOSIS — R7309 Other abnormal glucose: Secondary | ICD-10-CM

## 2012-11-17 DIAGNOSIS — M79605 Pain in left leg: Secondary | ICD-10-CM

## 2012-11-17 DIAGNOSIS — R739 Hyperglycemia, unspecified: Secondary | ICD-10-CM

## 2012-11-17 DIAGNOSIS — R21 Rash and other nonspecific skin eruption: Secondary | ICD-10-CM

## 2012-11-17 DIAGNOSIS — M545 Low back pain, unspecified: Secondary | ICD-10-CM

## 2012-11-17 MED ORDER — CYCLOBENZAPRINE HCL 10 MG PO TABS: 5.0000 mg | ORAL_TABLET | Freq: Three times a day (TID) | ORAL | Status: DC | PRN

## 2012-11-17 NOTE — Patient Instructions (Addendum)
Good to see you again today.  I am sorry you are still in pain. - We are refilling flexeril. - I am ordering an MRI due to your worse pain. - Continue advil 800mg  TID.  - Please seek immediate care if you get worse pain, bowel/bladder dysfunction, numbness/tingling in your privates, or other concerns. - Otherwise, follow up 1-2 weeks after MRI so we can discuss results and move forward. - It will be a good idea to establish care with a pain doctor since I do not prescribe long-term narcotics. - At follow up we can also discuss physical therapy, TENS unit for pain, or other options based on the MRI.  For itching, keep trying your cream and we will need to follow it up. Cut out any fragrances or new lotions or soaps. I am referring you to dermatology.  [name], MD

## 2012-11-17 NOTE — Progress Notes (Signed)
Patient ID: BASILIA STUCKERT, female   DOB: 29-May-1960, 52 y.o.   MRN: [id number] Subjective:   CC: Back pain follow-up and rash follow-up  HPI:   1. Back pain follow-up: Patient has chronic back pain starting "years ago" after lifting at work. About 1 year ago, she started having pain go down her left leg, and a 07/2012 MRI showed degenerative disk disease with spinal stenosis with left>right foraminal stenosis, possible chronic L5 nerve root encroachment, and disc protrusion L4-L5 and mildly progressive disc degeneration L3-L4. She had at some point in the past done PT for her back which she reports did not help. She had seen Dr. Lajoyce Corners after the April MRI who suggested injection, but she did not want this. Two weeks ago after fall, pain has been worse and she occasionally feels numbness/tingling. Symptoms are better with leaning forward. In the morning her hips also feel stiff and she has a hard time standing straight up. Stretching has helped but only minimally, and pain is now 9/10. Flexeril helps but she takes it TID and has run out of her 30 tabs. She had been seen once in Sports Medicine and referred to Neurosurgeon by Dr. Jennette Kettle, but uncertain if ever saw the neurosurgeon.   2. Rash follow-up: Pt has tried cutting out metformin and has stopped soma and rash is unchanged. She denies fevers or chills, bedbugs, others in household with similar symptoms, or new lotions/creams.  3. Hyperglycemia - Patient has stopped taking her metformin due to concerns about rash, but with no improvement. Plans to restart.  Review of Systems - Per HPI.   PMH: - Chronic back pain - Has tried PT - Completely stopped metformin to see if rash better   Objective:  Physical Exam BP 147/92  Pulse 90  Wt 249 lb (112.946 kg)  BMI 38.99 kg/m2 GEN: NAD HEENT: Atraumatic, normocephalic, neck supple, EOMI, sclera clear  CV: Skin warm and well-perfused PULM: normal effort ABD: Soft, nontender, nondistended, NABS, no  organomegaly SKIN: Widely-spaced maculopapular rash on upper arms and chest (total 15-20 3mm bumps) with no drainage, surrounding erythema, or excoriations; no cyanosis; warm and well-perfused EXTR: No lower extremity edema or calf tenderness PSYCH: Mood and affect euthymic, normal rate and volume of speech NEURO: Awake, alert, no focal deficits grossly, normal speech Back: No point tenderness either at spines or paraspinal muscles No obvious deformity along back Negative straight leg raise bilaterally just with some hamstring tightness Stiff gait with some difficulty fully straightening back  Assessment:     ANYLA ISRAELSON is a 52 y.o. female with h/o chronic back pain here for follow up of back pain and rash.    Plan:     # See problem list for problem-specific plans.

## 2012-11-17 NOTE — Assessment & Plan Note (Signed)
Likely a radiculitis related to spinal stenosis at lumbar spine, worsened with recent fall. Denies bowel/bladder symptoms or perineal numbness/tingling. - Refilling flexeril 30. Continue percocet filled at a recent visit. - MRI lumbosacral spine due to worsened symptoms. - Encouraged advil 800mg  TID.  - Return precautions reviewed - Otherwise, follow up 1-2 weeks after MRI so we can discuss results and move forward. - Encouraged pain doctor to manage chronic narcotics and referral placed - At follow up, discuss physical therapy, TENS unit for pain, readdress possibility of neurosurgery or injections, or other options based on the MRI.

## 2012-11-17 NOTE — Assessment & Plan Note (Addendum)
Patient stopped metformin herself to see if cause for symptoms. - Restart metformin encouraged.

## 2012-11-17 NOTE — Assessment & Plan Note (Signed)
No fevers or chills, no sick contacts or others with similar symptoms, no known new reason for a contact dermatitis. Nonspecific rash that is mild with no systemic symptoms. - Continue steroid cream - Be sure to cut out fragrances/new products - Referral placed to dermatology due to patient concern

## 2012-11-23 ENCOUNTER — Ambulatory Visit (HOSPITAL_COMMUNITY): Payer: Medicaid Other

## 2012-11-23 ENCOUNTER — Ambulatory Visit (HOSPITAL_COMMUNITY)
Admission: RE | Admit: 2012-11-23 | Discharge: 2012-11-23 | Disposition: A | Discharge status: Home / Self Care | Payer: Medicaid Other | Source: Ambulatory Visit | Attending: Family Medicine | Admitting: Family Medicine

## 2012-11-23 ENCOUNTER — Ambulatory Visit (HOSPITAL_COMMUNITY): Admission: RE | Admit: 2012-11-23 | Payer: Medicaid Other | Source: Ambulatory Visit

## 2012-11-23 DIAGNOSIS — M5137 Other intervertebral disc degeneration, lumbosacral region: Secondary | ICD-10-CM | POA: Insufficient documentation

## 2012-11-23 DIAGNOSIS — M79605 Pain in left leg: Secondary | ICD-10-CM

## 2012-11-23 DIAGNOSIS — D1779 Benign lipomatous neoplasm of other sites: Secondary | ICD-10-CM | POA: Insufficient documentation

## 2012-11-23 DIAGNOSIS — M51379 Other intervertebral disc degeneration, lumbosacral region without mention of lumbar back pain or lower extremity pain: Secondary | ICD-10-CM | POA: Insufficient documentation

## 2012-11-23 DIAGNOSIS — R609 Edema, unspecified: Secondary | ICD-10-CM | POA: Insufficient documentation

## 2012-11-24 ENCOUNTER — Telehealth: Payer: Self-pay | Admitting: Family Medicine

## 2012-11-24 NOTE — Telephone Encounter (Signed)
Pt called and given results and messages - verbalized understanding. Wyatt Haste, RN-BSN

## 2012-11-24 NOTE — Telephone Encounter (Signed)
Please call patient to let her know that lumbar and sacral MRIs of her back found no new explanation for her pain and mild degenerative changes are stable since 07/2011.  If her pain is still severe, I would still recommend she follows up with her orthopedic doctor and consider injections as previously discussed, based on their recommendations, as this may provide relief.  She should also follow up with [place] to further discuss results and medical management of back pain.  [name], MD

## 2012-11-27 ENCOUNTER — Other Ambulatory Visit: Payer: Self-pay | Admitting: *Deleted

## 2012-11-29 MED ORDER — TRAZODONE HCL 150 MG PO TABS: 150.0000 mg | ORAL_TABLET | Freq: Every day | ORAL | Status: DC

## 2012-12-04 ENCOUNTER — Encounter: Payer: Self-pay | Admitting: Physical Medicine & Rehabilitation

## 2012-12-04 ENCOUNTER — Ambulatory Visit (INDEPENDENT_AMBULATORY_CARE_PROVIDER_SITE_OTHER): Payer: Medicaid Other | Admitting: *Deleted

## 2012-12-04 ENCOUNTER — Telehealth: Payer: Self-pay | Admitting: Family Medicine

## 2012-12-04 DIAGNOSIS — Z111 Encounter for screening for respiratory tuberculosis: Secondary | ICD-10-CM

## 2012-12-04 NOTE — Telephone Encounter (Signed)
No phone call 

## 2012-12-04 NOTE — Progress Notes (Signed)
PPD Placement note Norma [name], 52 y.o. female is here today for placement of PPD test Reason for PPD test: work  Pt taken PPD test before: yes Verified in allergy area and with patient that they are not allergic to the products PPD is made of (Phenol or Tween). Yes Is patient taking any oral or IV steroid medication now or have they taken it in the last month? no Has the patient ever received the BCG vaccine?: no Has the patient been in recent contact with anyone known or suspected of having active TB disease?: no    O: Alert and oriented in NAD. P:  PPD placed on 12/04/2012.  Patient advised to return for reading within 48-72 hours. Wyatt Haste, RN-BSN

## 2012-12-05 ENCOUNTER — Ambulatory Visit: Payer: Medicaid Other | Admitting: Family Medicine

## 2012-12-06 ENCOUNTER — Ambulatory Visit: Payer: Medicaid Other | Admitting: *Deleted

## 2012-12-06 ENCOUNTER — Encounter: Payer: Self-pay | Admitting: *Deleted

## 2012-12-06 DIAGNOSIS — Z111 Encounter for screening for respiratory tuberculosis: Secondary | ICD-10-CM

## 2012-12-06 NOTE — Progress Notes (Signed)
PPD Reading Note PPD read and results entered in EpicCare. Result: 0 mm induration. Interpretation: negative Letter given regarding results. Elizabeth Taiya Nutting, RN-BSN  

## 2012-12-07 ENCOUNTER — Encounter: Payer: Self-pay | Admitting: Family Medicine

## 2012-12-07 ENCOUNTER — Ambulatory Visit (INDEPENDENT_AMBULATORY_CARE_PROVIDER_SITE_OTHER): Payer: Medicaid Other | Admitting: Family Medicine

## 2012-12-07 ENCOUNTER — Telehealth: Payer: Self-pay | Admitting: Family Medicine

## 2012-12-07 VITALS — BP 147/92 | HR 91 | Temp 98.1°F | Ht 67.0 in | Wt 243.0 lb

## 2012-12-07 DIAGNOSIS — N898 Other specified noninflammatory disorders of vagina: Secondary | ICD-10-CM

## 2012-12-07 DIAGNOSIS — M940 Chondrocostal junction syndrome [Tietze]: Secondary | ICD-10-CM

## 2012-12-07 DIAGNOSIS — N949 Unspecified condition associated with female genital organs and menstrual cycle: Secondary | ICD-10-CM

## 2012-12-07 LAB — POCT WET PREP (WET MOUNT)

## 2012-12-07 MED ORDER — METRONIDAZOLE 0.75 % VA GEL: 1.0000 | Freq: Every day | VAGINAL | Status: AC

## 2012-12-07 NOTE — Assessment & Plan Note (Signed)
Negative wet prep. Advised patient to use MetroGel for next 5 nights to see if it helps with the odor.

## 2012-12-07 NOTE — Progress Notes (Signed)
Subjective:     Patient ID: Norma [name], female   DOB: 26-Nov-1960, 52 y.o.   MRN: [id number]  HPI 52 year old female with history of chronic pain and costochondritis presents for same-day visit to discuss the following:  #1 vaginal odor: Vaginal odor without discharge itching or irritation. Started 2 weeks ago. Started after douching. Patient is occasionally sexually active.  #2 chest wall pain: Started 2 weeks ago. Intermittent sharp pains that last for about 4 minutes. Started on the right anterior chest and lateral chest. Now is also on the left. Pain occurs with activity and at rest. Patient denies preceding trauma or injury. Patient is not working. Pain exacerbated by deep breathing. No nausea, sweating, lightheadedness.  Review of Systems As per HPI     Objective:   Physical Exam BP 147/92  Pulse 91  Temp(Src) 98.1 F (36.7 C) (Oral)  Ht 5\' 7"  (1.702 m)  Wt 243 lb (110.224 kg)  BMI 38.05 kg/m2 General appearance: alert, cooperative, no distress and moderately obese Lungs: clear to auscultation bilaterally Chest wall: no deformity. No masses or bruising. Mild TTP R chest wall. No sternal tenderness.  Breasts: normal appearance, no masses or tenderness, No nipple retraction or dimpling, No nipple discharge or bleeding, No axillary or supraclavicular adenopathy, Normal to palpation without dominant masses Pelvic: external genitalia normal Vagina normal. With scant clear-white discharge. Very faint odor. Bimanual: no CMT, no uterine or adnexal tenderness.     Assessment and Plan:

## 2012-12-07 NOTE — Telephone Encounter (Signed)
Negative wet prep. Okay to take MetroGel as prescribed.

## 2012-12-07 NOTE — Patient Instructions (Addendum)
Norma [name],  Thank you for coming in today.   Your are having a flare of your costochondritis that is causing your chest wall pain: take ibuprofen 800 mg three times daily with food for next 5 days. Then go back to taking it as needed. Use ice pack 10 minutes on most painful areas 2-3 times daily.   I will call with wet prep results. I have sent metrogel to your pharmacy. Use nightly for the next 5 nights.    It was lovely seeing you! Happy belated birthday!!!   Dr. Armen Pickup

## 2012-12-07 NOTE — Assessment & Plan Note (Signed)
Recurrent Advised to increase use of ibuprofen to 800 mg 3 times a day with meals for the next 5 days, then back to as needed. Advise use of ice.

## 2012-12-21 ENCOUNTER — Ambulatory Visit: Payer: Medicaid Other

## 2012-12-27 ENCOUNTER — Telehealth: Payer: Self-pay | Admitting: Family Medicine

## 2012-12-27 NOTE — Telephone Encounter (Signed)
I do not see a letter where this is stated. I see a letter to [place] from the pain clinic stating she will not get narcotics on the first visit. Did she clarify who sent her the letter? Also, did she state whether or not she planned to follow through with seeing pain clinic physician who has contacted her?  Thanks. [name], MD

## 2012-12-27 NOTE — Telephone Encounter (Signed)
Please advise. Jazmin Hartsell,CMA  

## 2012-12-27 NOTE — Telephone Encounter (Signed)
Pt has chronic pain in her back. She doesn't want to have shots in her back. She received a letter in the mail stating pt could not longer receive narcotics for pain. Pt is upset because she will not longer be able to get her Percocet and wants another dr. Please advise

## 2012-12-27 NOTE — Telephone Encounter (Signed)
Called pt to find out who sent her the letter that they will no longer be able to give her medication for pain. Jazmin Hartsell,CMA

## 2012-12-27 NOTE — Telephone Encounter (Signed)
Called pt back and she letter was from [facility] here at [facility].  They stated in it that she would not be receiving narcotics with them, that they wanted to try trigger points and epidurals.  She does have an appt scheduled but not sure if she wants to keep it.  She states that with the combination she is taking now gabapentin, ibuprofen, percocet.  She is also down to 2 tabs left of her percocet and not due for refill until 01-01-13.  Would like to know what she needs to do?  She would also like to know if she needs to keep her appt with pain clinic.  Jazmin Hartsell,CMA

## 2012-12-29 ENCOUNTER — Encounter: Payer: Self-pay | Admitting: Family Medicine

## 2012-12-29 NOTE — Telephone Encounter (Addendum)
Yes I think it is prudent to keep the appt with Pain Clinic 01/01/13 as they are a good resource to help target her pain. Furthermore, they will decide whether or not it is appropriate to continue chronic narcotics.   If she only has 2 tablets left of her 12/01/12 prescription for 120 tabs to be used q6 hours PRN, it means she used the medication more frequently than I prescribed it. It is too soon for a refill on this and any further management of narcotics will be by the pain clinic.  I still strongly encourage other modalities of pain management, such as physical therapy or considering steroid injection, as I recommended at our last visit and as recommended when she saw the specialist.    [name], MD

## 2013-01-01 ENCOUNTER — Ambulatory Visit (HOSPITAL_BASED_OUTPATIENT_CLINIC_OR_DEPARTMENT_OTHER): Payer: Medicaid Other | Admitting: Physical Medicine & Rehabilitation

## 2013-01-01 ENCOUNTER — Encounter: Payer: Self-pay | Admitting: Physical Medicine & Rehabilitation

## 2013-01-01 ENCOUNTER — Encounter: Payer: Medicaid Other | Attending: Physical Medicine & Rehabilitation

## 2013-01-01 VITALS — BP 145/87 | HR 75 | Resp 14 | Ht 67.0 in | Wt 246.0 lb

## 2013-01-01 DIAGNOSIS — M5430 Sciatica, unspecified side: Secondary | ICD-10-CM

## 2013-01-01 DIAGNOSIS — M48061 Spinal stenosis, lumbar region without neurogenic claudication: Secondary | ICD-10-CM | POA: Insufficient documentation

## 2013-01-01 DIAGNOSIS — M5432 Sciatica, left side: Secondary | ICD-10-CM

## 2013-01-01 MED ORDER — DIAZEPAM 10 MG PO TABS: 10.0000 mg | ORAL_TABLET | Freq: Four times a day (QID) | ORAL | Status: DC | PRN

## 2013-01-01 NOTE — Telephone Encounter (Signed)
Pt informed. Norma [name]  

## 2013-01-01 NOTE — Patient Instructions (Signed)
Left L5-S1 translaminar injection scheduled  Take 10 mg of Valium prior to the injection  We'll need a driver

## 2013-01-01 NOTE — Progress Notes (Signed)
Subjective:    Patient ID: Norma [name], female    DOB: 1960-10-23, 52 y.o.   MRN: [id number] Consultation to eval for interventional pain management  HPI Complaints of back pain with left sided sciatica  Symptoms have persisted for years No sweats/chills/ weight loss , or bowel/bladder dysfunction Reports trying PT, no sig improvement with NSAID or muscle relaxers.  Unable to take prednisone but has been able to get shoulder and knee injection with "cortisone " in past Pain Inventory Average Pain 8 Pain Right Now 8 My pain is constant, dull and tingling  In the last 24 hours, has pain interfered with the following? General activity 8 Relation with others 8 Enjoyment of life 8 What TIME of day is your pain at its worst? morning daytime and evening Sleep (in general) Fair  Pain is worse with: walking, bending, sitting, inactivity and standing Pain improves with: medication Relief from Meds: 5  Mobility use a cane how many minutes can you walk? 5 ability to climb steps?  yes  Function not employed: date last employed 04/25/12  Neuro/Psych weakness numbness tingling trouble walking depression anxiety  Prior Studies Any changes since last visit?  no MRI LUMBAR SPINE WITHOUT CONTRAST 11/23/2012 Technique: Multiplanar and multiecho pulse sequences of the lumbar  spine were obtained without intravenous contrast.  Comparison: 07/20/2012 and earlier.  Findings: Same numbering system as on 07/20/2012. Overall stable  lumbar vertebral height and alignment. Chronic degenerative lower  lumbar endplate changes, including an L4 superior endplate  Schmorl's node. Mild marrow edema at the posterior L3-L4 endplate  is new but appears degenerative. Stable mild wedging of T12 which  could be congenital. No acute osseous abnormality identified.  Negative visible sacrum, the SI joints study is reported  separately.  Visualized lower thoracic spinal cord is normal with conus  medularis  at L1-L2. Stable visualized abdominal viscera.  Visualized paraspinal soft tissues are within normal limits.  T10-T11: Partially visible, grossly negative.  T11-T12: Mild circumferential disc bulges stable. No significant  stenosis.  T12-L1: Stable mild disc bulge, no stenosis.  L1-L2: Negative.  L2-L3: Negative disc. Stable mild facet hypertrophy. No  stenosis.  L3-L4: Stable disc space loss and circumferential disc bulge.  Stable mild ligament flavum hypertrophy and trace facet joint  fluid. Mild epidural lipomatosis. Stable borderline to mild  spinal stenosis and bilateral L3 foraminal stenosis.  L4-L5: Chronic disc desiccation, right eccentric circumferential  disc bulge, and mildly lobulated central disc protrusion. The disc  at this level appears stable. Mild superimposed facet hypertrophy  with trace facet joint fluid is stable. Stable spinal and lateral  recess stenosis, overall mild. Stable mild to moderate right L4  foraminal stenosis related to disc and facet changes.  L5-S1: Chronic severe disc space loss and bulky circumferential  disc osteophyte complex. Moderate facet hypertrophy. Mild  epidural lipomatosis. Possible small caudal protrusion of disc at  the left lateral recess re-identified and stable. Stable patency  of the thecal sac and lateral recesses. Chronic moderate to severe  bilateral L5 foraminal stenosis related to endplate and facet  spurring is stable.  IMPRESSION:  1. No acute findings in the lumbar spine aside from mild  degenerative appearing endplate marrow edema posteriorly at L3-L4.  2. Stable L3-L4 through L5-L1 degenerative findings since  07/20/2012.  3. Sacrum/SI joint MRI reported separately.  Physicians involved in your care Any changes since last visit?  no   History reviewed. No pertinent family history. History   Social History  .  Marital Status: Single    Spouse Name: N/A    Number of Children: N/A  . Years of Education: N/A    Social History Main Topics  . Smoking status: Never Smoker   . Smokeless tobacco: Never Used  . Alcohol Use: Yes     Comment: occasionally  . Drug Use: No  . Sexual Activity: Yes   Other Topics Concern  . None   Social History Narrative  . None   History reviewed. No pertinent past surgical history. Past Medical History  Diagnosis Date  . Hypertension   . Acid reflux   . Tachyarrhythmia   . Left shoulder pain   . Back pain   . Trigger point of thoracic region 03/22/2012   BP 145/87  Pulse 75  Resp 14  Ht 5\' 7"  (1.702 m)  Wt 246 lb (111.585 kg)  BMI 38.52 kg/m2  SpO2 98%     Review of Systems  Constitutional: Positive for diaphoresis and unexpected weight change.  Respiratory: Positive for apnea.   Musculoskeletal: Positive for back pain and gait problem.  Skin: Positive for rash.  Neurological: Positive for weakness and numbness.       Tingling  Psychiatric/Behavioral: Positive for dysphoric mood. The patient is nervous/anxious.   All other systems reviewed and are negative.       Objective:   Physical Exam  Nursing note and vitals reviewed. Constitutional: She is oriented to person, place, and time. She appears well-developed and well-nourished.  HENT:  Head: Normocephalic and atraumatic.  Eyes: Conjunctivae and EOM are normal. Pupils are equal, round, and reactive to light.  Neck: Normal range of motion. Neck supple.  Musculoskeletal:       Lumbar back: She exhibits decreased range of motion and tenderness.  -SLR  Tenderness, L4-5 adn L5-S1 paraspinals  Neurological: She is alert and oriented to person, place, and time. She has normal strength and normal reflexes. She displays no tremor. No sensory deficit. She exhibits normal muscle tone. Coordination and gait normal.  equivocal numbness Left S1 dermatomal distribution        Assessment & Plan:  1.  Lumbar stenosis with Left sciatica, would suspect L5 by radiologic studies but S1 by  clinical Will schedule for Left L5-S1 Translaminar injectionunder fluoro If no improvement after 3 injections consider NS eval

## 2013-01-04 NOTE — Telephone Encounter (Signed)
Received fax notification that patient has requested records to be sent to Dr. Daneil Dan office - gave to [name] who is coordinating this. [name], MD

## 2013-01-09 ENCOUNTER — Ambulatory Visit: Payer: Medicaid Other

## 2013-01-10 ENCOUNTER — Ambulatory Visit (INDEPENDENT_AMBULATORY_CARE_PROVIDER_SITE_OTHER): Payer: Medicaid Other | Admitting: Sports Medicine

## 2013-01-10 VITALS — BP 145/84 | HR 78 | Temp 98.4°F | Ht 67.0 in | Wt 246.0 lb

## 2013-01-10 DIAGNOSIS — M25569 Pain in unspecified knee: Secondary | ICD-10-CM

## 2013-01-10 DIAGNOSIS — Z23 Encounter for immunization: Secondary | ICD-10-CM

## 2013-01-10 DIAGNOSIS — M25562 Pain in left knee: Secondary | ICD-10-CM | POA: Insufficient documentation

## 2013-01-10 NOTE — Patient Instructions (Signed)
It was nice to see you today, thanks for coming in!  Problems addressed today: General Instructions:  1. Left knee pain     Knee Injection today for mechanical sx - likely degenerative meniscus  Follow up with Dr. Jennette Kettle in [facility] if not improved over the next 1-2 weeks to consider MRI  Home Exercises given     Please plan to return to see your PCP. See Dr. Jennette Kettle if not improved as above. Knee Exercises per hand out    If you need anything prior to your next visit please call the clinic. Please Bring all medications or accurate medication list with you to each appointment; an accurate medication list is essential in providing you the best care possible.

## 2013-01-10 NOTE — Assessment & Plan Note (Signed)
Left Knee Injection Performed today:  The risks, benefits, and expected outcomes of the injection were reviewed and she wishes to undergo the above named procedure.  After an appropriate time out was taken, the left knee was prepped in a clean fashion and injected from a superior Lateral,  Bent knee approach with a 21g syringe using 5cc of 1% plain Lidocaine and 1mL of 80mg /mL DepoMedrol.  A bandaid was applied to the area.  This procedure was well tolerated and there were no complications.  Pt had improvement in her symptoms following injection.

## 2013-01-10 NOTE — Progress Notes (Signed)
[facility] DEVYNNE STURDIVANT - 52 y.o. female MRN [id number]  Date of birth: [birth year]  CC & Reason for Visit  Norma [name] presents today for Knee Pain  Pertinent History & Care Coordination  Hx significan for Chronic Pain involving left sciatica, left rotator cuff, R ankle, OA of spine,  HTN, Hyperglycemia, obesity Non Smoker, Hepatitis C Carrier  Otherwise please see associated EMR sections for complete problem List, past medical history, past surgical history, family history and social history. HPI, Interval History & ROS  Pt reports acute problems with: # L Knee Pain  General  giving way.  Grinding, but no clicking, catching and locking   Been going on at least 6 but worsening over past 2 months.   Falling often, most recently yesterday Hard going up steps Has not tried a brace Doing some motion exercises  Effective Therapies:  Cane and Walker Using [employer]   Objective Findings   VITALS: HR: 78 bpm  BP: 145/84 mmHg  TEMP: 98.4 F (36.9 C) (Oral)  RESP:     HT: 5\' 7"  (170.2 cm)  WT: 246 lb (111.585 kg)  BMI: Body mass index is 38.52 kg/(m^2).   BP Readings from Last 3 Encounters:  01/10/13 145/84  01/01/13 145/87  12/07/12 147/92   Wt Readings from Last 3 Encounters:  01/10/13 246 lb (111.585 kg)  01/01/13 246 lb (111.585 kg)  12/07/12 243 lb (110.224 kg)     PHYSICAL EXAM: GENERAL: Adult obese AA  female. In no discomfort; no respiratory distress  PSYCH: alert and appropriate, good insight   Left Knee Exam: Appear:  superficial abrasions, no effusion  Palp:  TTP over Pes Bursa, medial joint line, lateral joint line.  Minimal patellar grind  ROM:  full  NV:   + strength  Testing:  + mcmurrary's, + thessaly,  Antalgic gain   Medications & Orders   Previous Medications   CITALOPRAM (CELEXA) 40 MG TABLET    Take 1 tablet (40 mg total) by mouth daily. (mental health)   CYCLOBENZAPRINE (FLEXERIL) 10 MG TABLET    Take 0.5-1 tablets (5-10  mg total) by mouth 3 (three) times daily as needed for muscle spasms.   DIAZEPAM (VALIUM) 10 MG TABLET    Take 1 tablet (10 mg total) by mouth every 6 (six) hours as needed for anxiety.   DILTIAZEM (CARDIZEM SR) 60 MG 12 HR CAPSULE    Take 1 capsule (60 mg total) by mouth 2 (two) times daily.   EPINEPHRINE (EPIPEN) 0.3 MG/0.3 ML SOAJ    Inject 0.3 mLs (0.3 mg total) into the muscle once.   FISH OIL-OMEGA-3 FATTY ACIDS 1000 MG CAPSULE    Take 2 g by mouth daily.   GABAPENTIN (NEURONTIN) 300 MG CAPSULE    Take 1-2 capsules (300-600 mg total) by mouth 3 (three) times daily.   HYDROCHLOROTHIAZIDE (HYDRODIURIL) 25 MG TABLET    Take 1 tablet (25 mg total) by mouth daily.   HYDROXYZINE (ATARAX/VISTARIL) 10 MG TABLET    Take 1 tablet (10 mg total) by mouth 3 (three) times daily as needed for itching.   IBUPROFEN (ADVIL,MOTRIN) 800 MG TABLET    Take 1 tablet (800 mg total) by mouth 2 (two) times daily as needed for pain.   LORAZEPAM (ATIVAN) 1 MG TABLET    Take 1 tablet (1 mg total) by mouth 2 (two) times daily as needed for anxiety.   METFORMIN (GLUCOPHAGE) 500 MG TABLET    Take 1 tablet (  500 mg total) by mouth 2 (two) times daily with a meal.   OMEPRAZOLE (PRILOSEC) 20 MG CAPSULE    Take 1 capsule (20 mg total) by mouth daily.   OXYCODONE-ACETAMINOPHEN (PERCOCET) 7.5-325 MG PER TABLET    Take 1 tablet by mouth every 6 (six) hours as needed for pain.   POLYETHYLENE GLYCOL (MIRALAX / GLYCOLAX) PACKET    Take 17 g by mouth daily as needed (CONSTIPATION).   TRAZODONE (DESYREL) 150 MG TABLET    Take 1 tablet (150 mg total) by mouth at bedtime.   TRIAMCINOLONE CREAM (KENALOG) 0.1 %    Apply topically 2 (two) times daily.   Modified Medications   No medications on file   New Prescriptions   No medications on file   Discontinued Medications   No medications on file  No orders of the defined types were placed in this encounter.    Assessment & Plan    Problems addressed today: General Instructions:  1.  Left knee pain     Knee Injection today for mechanical sx - likely degenerative meniscus  Follow up with Dr. Jennette Kettle in [facility] if not improved over the next 1-2 weeks to consider MRI  Home Exercises given  Flu Shot today    Follow Up Issues: > Consider MRI given mechanical symptoms > ensure compliance with exercise regimen

## 2013-01-11 ENCOUNTER — Other Ambulatory Visit: Payer: Self-pay | Admitting: Family Medicine

## 2013-01-23 ENCOUNTER — Ambulatory Visit: Payer: Medicaid Other | Admitting: Physical Medicine & Rehabilitation

## 2013-01-29 ENCOUNTER — Telehealth: Payer: Self-pay | Admitting: Family Medicine

## 2013-01-29 DIAGNOSIS — M25562 Pain in left knee: Secondary | ICD-10-CM

## 2013-01-29 NOTE — Telephone Encounter (Signed)
Will forward to Dr. Berline Chough. Lyanne Kates,CMA

## 2013-01-29 NOTE — Telephone Encounter (Signed)
LVM for patient to call back. ?

## 2013-01-29 NOTE — Telephone Encounter (Signed)
Pt called and would like to have a x-ray done on her knee. She was told by Dr. Berline Chough if the shot didn't work he would order a x-ray. JW

## 2013-01-29 NOTE — Assessment & Plan Note (Signed)
Weightbearing AP films of bilateral knees to evaluate for osteoarthritis. Plan follow up with sports medicine to further discuss options and consider ultrasound versus MRI.

## 2013-01-30 ENCOUNTER — Telehealth: Payer: Self-pay | Admitting: *Deleted

## 2013-01-30 NOTE — Telephone Encounter (Signed)
Pharmacy calling requesting refill on triamcinolone for patient, will forward to MD.

## 2013-01-30 NOTE — Telephone Encounter (Signed)
Spoke with patient and informed her of below 

## 2013-01-30 NOTE — Telephone Encounter (Addendum)
Last I knew patient had switched PCP to Dr. Daneil Dan office and front office staff was working on sending records there. Please inform if this is not the case.    [name], MD

## 2013-01-31 NOTE — Telephone Encounter (Signed)
Patient stated at last office visit that she decided to continue her care here, and will no longer be transferring care.

## 2013-02-05 ENCOUNTER — Telehealth: Payer: Self-pay | Admitting: Family Medicine

## 2013-02-05 NOTE — Telephone Encounter (Signed)
Patient requesting referral to get a mammogram.

## 2013-02-05 NOTE — Telephone Encounter (Signed)
Left message for pt to call back.  Please give number for [facility] and pt can call and schedule her own appt.  [phone].  Thanks [employer]

## 2013-02-06 ENCOUNTER — Encounter: Payer: Medicaid Other | Attending: Physical Medicine & Rehabilitation

## 2013-02-06 ENCOUNTER — Encounter: Payer: Self-pay | Admitting: Physical Medicine & Rehabilitation

## 2013-02-06 ENCOUNTER — Other Ambulatory Visit: Payer: Self-pay

## 2013-02-06 ENCOUNTER — Ambulatory Visit (HOSPITAL_BASED_OUTPATIENT_CLINIC_OR_DEPARTMENT_OTHER): Payer: Medicaid Other | Admitting: Physical Medicine & Rehabilitation

## 2013-02-06 VITALS — BP 151/77 | HR 86 | Resp 14 | Ht 67.0 in | Wt 252.0 lb

## 2013-02-06 DIAGNOSIS — M5430 Sciatica, unspecified side: Secondary | ICD-10-CM

## 2013-02-06 DIAGNOSIS — Z79899 Other long term (current) drug therapy: Secondary | ICD-10-CM

## 2013-02-06 DIAGNOSIS — Z5181 Encounter for therapeutic drug level monitoring: Secondary | ICD-10-CM

## 2013-02-06 DIAGNOSIS — M48061 Spinal stenosis, lumbar region without neurogenic claudication: Secondary | ICD-10-CM | POA: Insufficient documentation

## 2013-02-06 NOTE — Progress Notes (Signed)
  Subjective:    Patient ID: Norma [name], female    DOB: [birth year], 52 y.o.   MRN: [id number]  HPI Was scheduled for L5-S1 Translaminar ESI, pt decided she doesn't want to have injection.  We discussed other treatment options including surgical referral Pain Inventory Average Pain 10 Pain Right Now 8 My pain is constant, sharp and tingling  In the last 24 hours, has pain interfered with the following? General activity 10 Relation with others 10 Enjoyment of life 10 What TIME of day is your pain at its worst? daytime Sleep (in general) Fair  Pain is worse with: walking, bending, sitting, standing and some activites Pain improves with: rest, pacing activities and medication Relief from Meds: 1  Mobility walk without assistance how many minutes can you walk? 10 ability to climb steps?  yes do you drive?  yes Do you have any goals in this area?  yes  Function not employed: date last employed . Do you have any goals in this area?  yes  Neuro/Psych weakness numbness tingling trouble walking spasms depression anxiety  Prior Studies Any changes since last visit?  no  Physicians involved in your care Any changes since last visit?  no   History reviewed. No pertinent family history. History   Social History  . Marital Status: Single    Spouse Name: N/A    Number of Children: N/A  . Years of Education: N/A   Social History Main Topics  . Smoking status: Never Smoker   . Smokeless tobacco: Never Used  . Alcohol Use: Yes     Comment: occasionally  . Drug Use: No  . Sexual Activity: Yes   Other Topics Concern  . None   Social History Narrative  . None   History reviewed. No pertinent past surgical history. Past Medical History  Diagnosis Date  . Hypertension   . Acid reflux   . Tachyarrhythmia   . Left shoulder pain   . Back pain   . Trigger point of thoracic region 03/22/2012   BP 151/77  Pulse 86  Resp 14  Ht 5\' 7"  (1.702 m)  Wt 252 lb  (114.306 kg)  BMI 39.46 kg/m2  SpO2 97%     Review of Systems  Musculoskeletal: Positive for arthralgias, back pain, gait problem and myalgias.  Neurological: Positive for tremors, weakness and numbness.  Psychiatric/Behavioral: Positive for dysphoric mood. The patient is nervous/anxious.   All other systems reviewed and are negative.       Objective:   Physical Exam  Constitutional: She is oriented to person, place, and time.  Neurological: She is alert and oriented to person, place, and time. She has normal strength. Gait normal.  Reflex Scores:      Patellar reflexes are 2+ on the right side and 2+ on the left side.      Achilles reflexes are 2+ on the right side and 0 on the left side. Numbness both feet          Assessment & Plan:  1.  Lumbo sacral radiculitis with evidence of diminished Left S 1 reflex, pt would benefit from either L5-S1 translaminar injection or S1 transforaminal Pt is not wanting this option will return to PCP to discuss possible surgical referral

## 2013-02-06 NOTE — Patient Instructions (Signed)
Please ask your primary MD fro neurosurgery referral if you are considering surgery  If you decide to try epidural injection please call to schedule

## 2013-02-07 ENCOUNTER — Ambulatory Visit
Admission: RE | Admit: 2013-02-07 | Discharge: 2013-02-07 | Disposition: A | Discharge status: Home / Self Care | Payer: Medicaid Other | Source: Ambulatory Visit | Attending: Family Medicine | Admitting: Family Medicine

## 2013-02-07 DIAGNOSIS — M25562 Pain in left knee: Secondary | ICD-10-CM

## 2013-02-07 MED ORDER — TRIAMCINOLONE ACETONIDE 0.1 % EX CREA: TOPICAL_CREAM | Freq: Two times a day (BID) | CUTANEOUS | Status: DC

## 2013-02-07 NOTE — Telephone Encounter (Signed)
Refilled. If problem recurs, would need to reassess in PCP office.  [name], MD 02/07/2013 12:36 PM

## 2013-02-07 NOTE — Addendum Note (Signed)
Addended by: Simone Curia T on: 02/07/2013 12:36 PM   Modules accepted: Orders

## 2013-02-09 ENCOUNTER — Telehealth: Payer: Self-pay

## 2013-02-09 NOTE — Telephone Encounter (Signed)
Patients inquiring xrays results from 10/22. Please call patient.

## 2013-02-12 ENCOUNTER — Telehealth: Payer: Self-pay | Admitting: Family Medicine

## 2013-02-12 NOTE — Telephone Encounter (Signed)
Opened in error

## 2013-02-12 NOTE — Telephone Encounter (Signed)
Called patient to communicate results of knee x-ray:   IMPRESSION:  Early degenerative changes bilaterally, left slightly greater than  right. No acute findings.  Explained that there is no acute finding and nothing acutely to do. Explained that treatment includes physical therapy, weight loss, and often changes can be reversed if adequate weight loss/PT are achieved, but are also common in the 5th decade of life and are not noted to be severe in imaging. Explained that knee injections are an option. Pt stated she has tried knee injections before and they did not work. Explained there is nothing to do in the acute setting and that treatment is all the things discussed here. Pt voiced understanding.  Norma Singleton, MD  02/12/2013 9:13 AM

## 2013-02-28 ENCOUNTER — Encounter: Payer: Self-pay | Admitting: Family Medicine

## 2013-02-28 ENCOUNTER — Ambulatory Visit (INDEPENDENT_AMBULATORY_CARE_PROVIDER_SITE_OTHER): Payer: Medicaid Other | Admitting: Family Medicine

## 2013-02-28 VITALS — BP 136/91 | HR 90 | Temp 98.5°F | Wt 246.0 lb

## 2013-02-28 DIAGNOSIS — M545 Low back pain, unspecified: Secondary | ICD-10-CM

## 2013-02-28 DIAGNOSIS — M79605 Pain in left leg: Secondary | ICD-10-CM

## 2013-02-28 DIAGNOSIS — M25562 Pain in left knee: Secondary | ICD-10-CM

## 2013-02-28 DIAGNOSIS — R52 Pain, unspecified: Secondary | ICD-10-CM

## 2013-02-28 DIAGNOSIS — I10 Essential (primary) hypertension: Secondary | ICD-10-CM

## 2013-02-28 DIAGNOSIS — M25569 Pain in unspecified knee: Secondary | ICD-10-CM

## 2013-02-28 NOTE — Patient Instructions (Addendum)
It was good to see you today.  Continue gabapentin. I want to further evaluate for diabetes due to the burning with no other symptoms. Come back as able for evaluation for your blood pressure and diabetes, since we have not talked about these in some time.  You do not need another referral for sports med since you saw them in March. I think going to back to see them would be helpful. I will also discuss your case with Dr Jennette Kettle to see if she has other ideas. I also think seeing pain management is a good long-term option for you. I also want you to consider injection but I understand this is a personal decision and you need to think about it.  Call and get a mammogram.  If you develop any "red flag symptoms", please seek immediate care.

## 2013-02-28 NOTE — Progress Notes (Signed)
Patient ID: Norma [name], female   DOB: 08/05/1960, 52 y.o.   MRN: [id number] Subjective:   CC: Tingling, knee pain  HPI: Patient comes in again to discuss her chronic pain of multiple areas and also complains today of burning in hands, feet, and breast, and headaches. Today she would like to focus on burning, left knee pain, and back pain.  1. Skin burning - Pt reports a burning sensation in hands, feet and breast that lasts all day and has been present >1 month. Symptom is worse when up moving. She does not notice any related rash, breast discharge, any redness, swelling, fevers, or chills. Denies new medication, lotions, creams, or other products.   2. Left knee pain - Pt reports knee continues to bend "out" and make a popping/grinding sensation. Pain is severe and is worst when she tries to walk, makes it uncomfortable for her to sleep at night. She has been using a brace which helps with stability. She had fall in late August and none since. She had been getting injections but most recently 9/24 injection did not help at all. She saw pain clinic but was unsure about if she wanted to go back.  3. Back pain - Also very painful, saw pain med clinic who recommended she get back injection. She did not want this due to fear and did not make f/u with them. Denies red flag symptoms of bowel/bladder changes, perineal tingling/paresthesias, sudden weakness.    Review of Systems - Per HPI. Additionally, complains of left-sided headache but does not report nausea/vomiting, dizziness, fainting, speech or gait difficulty.  PMH: Seen twice at [facility]   Health Maintenance: Not discussed    Objective:  Physical Exam BP 136/91  Pulse 90  Temp(Src) 98.5 F (36.9 C) (Oral)  Wt 246 lb (111.585 kg) GEN: NAD PULM: Normal effort EXTR: Left knee grinding sound/sensation on knee flexion and extension, no effusion, no point tenderness, no erythema ABD: Obese SKIN: No rash or cyanosis CHEST:  No breast tenderness or discharge, no skin changes, no mass palpated, pendulous breast NEURO: Awake, alert, no focal deficits   Assessment:     Norma [name] is a 52 y.o. female with h/o lumbar spinal stenosis and sciatica here for folllow up of back pain, knee pain, and with burning in hands/feet/breast.    Plan:     # See problem list and after visit summary for problem-specific plans. - Of note: Pt brought up headache near end of visit and we did not evaluate it due to other issues.   [name], MD 03/01/2013 7:00 PM

## 2013-03-01 DIAGNOSIS — R52 Pain, unspecified: Secondary | ICD-10-CM | POA: Insufficient documentation

## 2013-03-01 NOTE — Assessment & Plan Note (Signed)
Not evaluated. BP today is moderately uncontrolled. - Pt needs to come back for eval of HTN as we have not discussed this in some time. - BP today seems moderately well-controlled

## 2013-03-01 NOTE — Assessment & Plan Note (Signed)
Not improved. Likely osteoarthritic. Knee xray 10/22 early degen changes bilaterally, Lslightly > right. Nonacute.  - Failed steroid injection 9/24 - Encouraged f/u with pain management - Encouraged f/u with SM per Dr Janeece Riggers note 10/13 to discuss further options and consider [place] vs MRI

## 2013-03-01 NOTE — Assessment & Plan Note (Signed)
Nonspecific; uncertain etiology; possibly diabetic neuropathy, though location is not consistent with this. - Afebrile, no vital instability, no rash - No breast discharge - Continue gabapentin - Pt to schedule mammogram which she is due for - Pt to come back when able within next 2 months for DM eval

## 2013-03-01 NOTE — Assessment & Plan Note (Signed)
Not improved, no red flag symptoms; Recent MRI due to worsened sx: 11/2012: No acute; mild degenerative edema posteriorly at L3-L4. Stable degenerative findings since 07/20/12 in L3-L4 through L5-S1. Unremarkable sacral/coccyx MRI.  - Lumbar stenosis, left-sided sciatica, lumbosacral radiculitis with decr S1 reflex left - Seeing Pain Med clinic - Does not want the recommended L5-S1 translaminar injection or S1 transforaminal, or surgery - Nursing, please ask Pain Med clinic to help manage pain, considering any method appropriate (including narcotics); not sure why referral stated "only injections" - Encouraged pt to f/u with pain clinic given pain will likely be chronic - May benefit from SM re-eval due to worsened pain over last few months  - Encouraged her to think about injection; refuses PT. - At f/u suggest TENS unit for pain.

## 2013-03-07 ENCOUNTER — Telehealth: Payer: Self-pay | Admitting: Family Medicine

## 2013-03-07 NOTE — Telephone Encounter (Signed)
Pt has to testify in court Nov 24. She is wanting a letter to give DA stating she cannot walk that far because of her hip and leg pain. Please advise

## 2013-03-09 NOTE — Telephone Encounter (Signed)
LMOVM for pt to return call.   Please ask if she would like the letter mentioned by Dr. Benjamin Stain below. Fle[place], [name]

## 2013-03-09 NOTE — Telephone Encounter (Signed)
Please let her know that while I cannot state she cannot walk (this would be paralysis), I certainly can list her medical diagnoses including the chronic low back pain with radiculopathy. Ask her if she would like me to do this and leave a letter up front.  Does she have to walk to the courtroom?

## 2013-03-09 NOTE — Telephone Encounter (Signed)
Pt returned call and said that would be fine to say. jw

## 2013-03-12 ENCOUNTER — Other Ambulatory Visit: Payer: Self-pay | Admitting: Family Medicine

## 2013-03-12 ENCOUNTER — Encounter: Payer: Self-pay | Admitting: Family Medicine

## 2013-03-12 ENCOUNTER — Ambulatory Visit (INDEPENDENT_AMBULATORY_CARE_PROVIDER_SITE_OTHER): Payer: Medicaid Other | Admitting: Family Medicine

## 2013-03-12 VITALS — BP 131/84 | HR 88 | Temp 97.9°F | Ht 67.0 in | Wt 249.0 lb

## 2013-03-12 DIAGNOSIS — M25569 Pain in unspecified knee: Secondary | ICD-10-CM

## 2013-03-12 DIAGNOSIS — Z1231 Encounter for screening mammogram for malignant neoplasm of breast: Secondary | ICD-10-CM

## 2013-03-12 DIAGNOSIS — M25562 Pain in left knee: Secondary | ICD-10-CM

## 2013-03-12 MED ORDER — MELOXICAM 7.5 MG PO TABS: 7.5000 mg | ORAL_TABLET | Freq: Every day | ORAL | Status: DC

## 2013-03-12 MED ORDER — ACETAMINOPHEN ER 650 MG PO TBCR: 650.0000 mg | EXTENDED_RELEASE_TABLET | Freq: Three times a day (TID) | ORAL | Status: DC

## 2013-03-12 NOTE — Telephone Encounter (Signed)
Will put letter up front now. [name], MD 03/12/2013 2:42 PM

## 2013-03-12 NOTE — Progress Notes (Signed)
Family Medicine Office Visit Note   Subjective:   Patient ID: Norma [name], female  DOB: February 24, 1961, 52 y.o.. MRN: [id number]   Pt that comes today for same day appointment complaining of hip an knee pain. She had xrays done this year that showed DJD and her present pain control treatment as she states "is not working". She  has been sent to Pain Clinic for evaluation and they had offered steroid injections which pt declines due to concerns for side effects. Her pain today is the same as it has been. No new joint involvement or change in intensity of her symptoms. She denies redness or swelling noted. Denies hx of recent trauma.  Review of Systems:  Per HPI. Denies fever, chills, saddle anesthesia, urinary or fecal incontinence. No numbness or paresthesias reported.  Objective:   Physical Exam: Gen:  Obese, NAD HEENT: Moist mucous membranes  CV: Regular rate and rhythm, no murmurs PULM: Clear to auscultation bilaterally.  EXT: Mild bilateral knee effusion. No erythema or calor. Tenderness  to palpation on joint line. No joint laxity. MSK: Lumbar spine and hips: ROM is limited due to reported pain. No tenderness to palpation on vertebral bodies or paraspinal muscles. straight leg no able to evaluate properly due to pt's body habitus. Neuro: Alert and oriented x3. No focalization. Gait is assisted with a cane.  Assessment & Plan:

## 2013-03-12 NOTE — Patient Instructions (Signed)
Please take the medications as prescribed. Make an appointment to follow up in 2-3 weeks or sooner if pain increase, worsen or you develop other symptoms that concern to you.

## 2013-03-12 NOTE — Telephone Encounter (Signed)
Pt informed. Erhard Senske Dawn  

## 2013-03-13 NOTE — Assessment & Plan Note (Signed)
Same as hip pain assessment. Chronic in nature. DJD in xray. Did not improve with steroid shots. Analgesic and antiinflammatory short term. F/u with PCP.

## 2013-03-13 NOTE — Assessment & Plan Note (Addendum)
Chronic complaint. No acute signs of worsening condition. Offered NSAIDs and acetaminophen short term. Pt has been evaluated for pain with the specialists and she is refusing therapy offered. Discussed that for chronic issues she need to f/u with primary doctor.

## 2013-03-29 ENCOUNTER — Ambulatory Visit (HOSPITAL_COMMUNITY)
Admission: RE | Admit: 2013-03-29 | Discharge: 2013-03-29 | Disposition: A | Discharge status: Home / Self Care | Payer: Medicaid Other | Source: Ambulatory Visit | Attending: Family Medicine | Admitting: Family Medicine

## 2013-03-29 DIAGNOSIS — Z1231 Encounter for screening mammogram for malignant neoplasm of breast: Secondary | ICD-10-CM | POA: Insufficient documentation

## 2013-03-30 ENCOUNTER — Ambulatory Visit: Payer: Medicaid Other | Admitting: Family Medicine

## 2013-04-08 ENCOUNTER — Encounter (HOSPITAL_COMMUNITY): Payer: Self-pay | Admitting: Emergency Medicine

## 2013-04-08 ENCOUNTER — Emergency Department (HOSPITAL_COMMUNITY): Payer: Medicaid Other

## 2013-04-08 ENCOUNTER — Emergency Department (HOSPITAL_COMMUNITY)
Admission: EM | Admit: 2013-04-08 | Discharge: 2013-04-09 | Disposition: A | Discharge status: Home / Self Care | Payer: Medicaid Other | Attending: Emergency Medicine | Admitting: Emergency Medicine

## 2013-04-08 DIAGNOSIS — Z8739 Personal history of other diseases of the musculoskeletal system and connective tissue: Secondary | ICD-10-CM | POA: Insufficient documentation

## 2013-04-08 DIAGNOSIS — Z79899 Other long term (current) drug therapy: Secondary | ICD-10-CM | POA: Insufficient documentation

## 2013-04-08 DIAGNOSIS — R109 Unspecified abdominal pain: Secondary | ICD-10-CM

## 2013-04-08 DIAGNOSIS — I10 Essential (primary) hypertension: Secondary | ICD-10-CM | POA: Insufficient documentation

## 2013-04-08 DIAGNOSIS — R1012 Left upper quadrant pain: Secondary | ICD-10-CM | POA: Insufficient documentation

## 2013-04-08 DIAGNOSIS — M79609 Pain in unspecified limb: Secondary | ICD-10-CM | POA: Insufficient documentation

## 2013-04-08 DIAGNOSIS — Z791 Long term (current) use of non-steroidal anti-inflammatories (NSAID): Secondary | ICD-10-CM | POA: Insufficient documentation

## 2013-04-08 DIAGNOSIS — R252 Cramp and spasm: Secondary | ICD-10-CM | POA: Insufficient documentation

## 2013-04-08 DIAGNOSIS — K219 Gastro-esophageal reflux disease without esophagitis: Secondary | ICD-10-CM | POA: Insufficient documentation

## 2013-04-08 HISTORY — DX: Cardiac arrhythmia, unspecified: I49.9

## 2013-04-08 LAB — CBC WITH DIFFERENTIAL/PLATELET
Basophils Absolute: 0 10*3/uL (ref 0.0–0.1)
Basophils Relative: 0 % (ref 0–1)
HCT: 38.5 % (ref 36.0–46.0)
MCHC: 31.9 g/dL (ref 30.0–36.0)
Monocytes Absolute: 0.5 10*3/uL (ref 0.1–1.0)
Neutro Abs: 3.3 10*3/uL (ref 1.7–7.7)
Neutrophils Relative %: 49 % (ref 43–77)
Platelets: 221 10*3/uL (ref 150–400)
RDW: 15.8 % — ABNORMAL HIGH (ref 11.5–15.5)
WBC: 6.7 10*3/uL (ref 4.0–10.5)

## 2013-04-08 LAB — COMPREHENSIVE METABOLIC PANEL
ALT: 31 U/L (ref 0–35)
AST: 47 U/L — ABNORMAL HIGH (ref 0–37)
Albumin: 3.9 g/dL (ref 3.5–5.2)
Calcium: 9.3 mg/dL (ref 8.4–10.5)
Chloride: 101 mEq/L (ref 96–112)
Creatinine, Ser: 1.1 mg/dL (ref 0.50–1.10)
GFR calc non Af Amer: 56 mL/min — ABNORMAL LOW (ref >90)
Glucose, Bld: 114 mg/dL — ABNORMAL HIGH (ref 70–99)
Potassium: 3.8 mEq/L (ref 3.5–5.1)
Sodium: 137 mEq/L (ref 135–145)
Total Bilirubin: 0.6 mg/dL (ref 0.3–1.2)

## 2013-04-08 LAB — URINALYSIS, ROUTINE W REFLEX MICROSCOPIC
Glucose, UA: NEGATIVE mg/dL
Ketones, ur: NEGATIVE mg/dL
Protein, ur: NEGATIVE mg/dL
Urobilinogen, UA: 1 mg/dL (ref 0.0–1.0)
pH: 6 (ref 5.0–8.0)

## 2013-04-08 LAB — URINE MICROSCOPIC-ADD ON

## 2013-04-08 MED ORDER — MORPHINE SULFATE 4 MG/ML IJ SOLN
4.0000 mg | Freq: Once | INTRAMUSCULAR | Status: AC
  Administered 2013-04-08: 4 mg via INTRAVENOUS
  Filled 2013-04-08: qty 1

## 2013-04-08 MED ORDER — HYDROMORPHONE HCL PF 1 MG/ML IJ SOLN
1.0000 mg | Freq: Once | INTRAMUSCULAR | Status: AC
  Administered 2013-04-09: 1 mg via INTRAVENOUS
  Filled 2013-04-08: qty 1

## 2013-04-08 MED ORDER — SODIUM CHLORIDE 0.9 % IV SOLN
Freq: Once | INTRAVENOUS | Status: AC
  Administered 2013-04-08: 23:00:00 via INTRAVENOUS

## 2013-04-08 MED ORDER — ONDANSETRON HCL 4 MG/2ML IJ SOLN
4.0000 mg | Freq: Once | INTRAMUSCULAR | Status: AC
  Administered 2013-04-08: 4 mg via INTRAVENOUS
  Filled 2013-04-08: qty 2

## 2013-04-08 NOTE — ED Notes (Signed)
Pt and family are being very rude and demanding care for pain medicine, water and to have the heat cut down. Pt did not want to have lab drawn by phlebotomy and demanded to only be stuck one time. IV started and labs obtained in one stick. Pt family at physician office demanding pain medication.

## 2013-04-08 NOTE — ED Notes (Signed)
Pt arrives to ER via EMS with complaints of abd pain; pt states that she has had generalized abdominal cramping off an d on x 3 weeks; pt states that around 2015 this pm she began to have sharp intermittent pain to LUQ that radiates to the rt side;  Pt denies Nausea or vomiting; pt rates pain at a 7 on a scale of 0-10 currently

## 2013-04-08 NOTE — ED Provider Notes (Addendum)
CSN: [id number]     Arrival date & time 04/08/13  2113 History   First MD Initiated Contact with Patient 04/08/13 2137     Chief Complaint  Patient presents with  . Abdominal Pain   (Consider location/radiation/quality/duration/timing/severity/associated sxs/prior Treatment) HPI This is a 52 year old female who presents emergency Department with chief complaint of abdominal pain.  She has a past medical history morbid obesity, borderline diabetic, hypertension.  He states that for the past 3 weeks she has had intermittent cramping pain especially in the left upper quadrant.  General he seems to be relieved with her GERD medications.  Patient states that today she was wrapping gifts on the floor when she had acute onset severe left upper quadrant abdominal pain she states it is sharp and burning.  She states that she had to get her son to help her up off of the floor.  The pain lasted for approximately 7 minutes and then resolved.  She complains now that she is having intermittent cramping all over her body, cramping in the left flank, pain in her left toes and leg.  She has a history of chronic osteoarthritis,  Past Medical History  Diagnosis Date  . Hypertension   . Acid reflux   . Tachyarrhythmia   . Left shoulder pain   . Back pain   . Trigger point of thoracic region 03/22/2012  . Irregular heart beat    Past Surgical History  Procedure Laterality Date  . Ankle surgery     No family history on file. History  Substance Use Topics  . Smoking status: Never Smoker   . Smokeless tobacco: Never Used  . Alcohol Use: Yes     Comment: occasionally   OB History   Grav Para Term Preterm Abortions TAB SAB Ect Mult Living                 Review of Systems  Allergies  Soma; Nortriptyline; and Prednisone  Home Medications   Current Outpatient Rx  Name  Route  Sig  Dispense  Refill  . acetaminophen (TYLENOL) 650 MG CR tablet   Oral   Take 1 tablet (650 mg total) by mouth every 8  (eight) hours.   30 tablet   3   . citalopram (CELEXA) 40 MG tablet   Oral   Take 1 tablet (40 mg total) by mouth daily. (mental health)   90 tablet   2   . diltiazem (CARDIZEM SR) 60 MG 12 hr capsule   Oral   Take 1 capsule (60 mg total) by mouth 2 (two) times daily.   180 capsule   3   . fish oil-omega-3 fatty acids 1000 MG capsule   Oral   Take 2 g by mouth daily.         Marland Kitchen gabapentin (NEURONTIN) 300 MG capsule   Oral   Take 1-2 capsules (300-600 mg total) by mouth 3 (three) times daily.   180 capsule   1   . hydrochlorothiazide (HYDRODIURIL) 25 MG tablet   Oral   Take 1 tablet (25 mg total) by mouth daily.   90 tablet   3   . hydrOXYzine (ATARAX/VISTARIL) 10 MG tablet      TAKE 1 TABLET BY MOUTH 3 TIMES DAILY AS NEEDED FOR ITCHING   30 tablet   2   . meloxicam (MOBIC) 7.5 MG tablet   Oral   Take 1-2 tablets (7.5-15 mg total) by mouth daily.   30 tablet  0   . metFORMIN (GLUCOPHAGE) 500 MG tablet   Oral   Take 1 tablet (500 mg total) by mouth 2 (two) times daily with a meal.   180 tablet   3   . omeprazole (PRILOSEC) 20 MG capsule   Oral   Take 1 capsule (20 mg total) by mouth daily.   30 capsule   3   . OVER THE COUNTER MEDICATION   Oral   Take 3 tablets by mouth 2 (two) times daily. Patient is taking Lipozene, OTC weight loss med         . polyethylene glycol (MIRALAX / GLYCOLAX) packet   Oral   Take 17 g by mouth daily as needed (CONSTIPATION).         Marland Kitchen traZODone (DESYREL) 150 MG tablet   Oral   Take 1 tablet (150 mg total) by mouth at bedtime.   30 tablet   3   . EPINEPHrine (EPIPEN) 0.3 mg/0.3 mL SOAJ   Intramuscular   Inject 0.3 mLs (0.3 mg total) into the muscle once.   1 Device   1    BP 128/72  Pulse 86  Temp(Src) 97.8 F (36.6 C) (Oral)  Resp 18  SpO2 98% Physical Exam Physical Exam  Nursing note and vitals reviewed. Constitutional: She is oriented to person, place, and time. She appears well-developed and  well-nourished. No distress, but appears mildly uncomfortable. HENT:  Head: Normocephalic and atraumatic.  Eyes: Conjunctivae normal and EOM are normal. Pupils are equal, round, and reactive to light. No scleral icterus.  Neck: Normal range of motion.  Cardiovascular: Normal rate, regular rhythm and normal heart sounds.  Exam reveals no gallop and no friction rub.   No murmur heard. Pulmonary/Chest: Effort normal and breath sounds normal. No respiratory distress.  Abdominal: Soft. Bowel sounds are normal. She exhibits no distension and no mass. LUQ tenderness. NO CVA tenderness. Skin: Skin is warm and dry. She is not diaphoretic.    ED Course  Procedures (including critical care time) Labs Review Labs Reviewed  CBC WITH DIFFERENTIAL - Abnormal; Notable for the following:    RDW 15.8 (*)    All other components within normal limits  COMPREHENSIVE METABOLIC PANEL - Abnormal; Notable for the following:    Glucose, Bld 114 (*)    AST 47 (*)    GFR calc non Af Amer 56 (*)    GFR calc Af Amer 65 (*)    All other components within normal limits  URINALYSIS, ROUTINE W REFLEX MICROSCOPIC - Abnormal; Notable for the following:    APPearance CLOUDY (*)    Leukocytes, UA MODERATE (*)    All other components within normal limits  LIPASE, BLOOD  URINE MICROSCOPIC-ADD ON   Imaging Review No results found.  EKG Interpretation   None       MDM   1. Abdominal pain   2. Cramps, muscle, general    Patient with vague cramping complaints and abdominal pain. She takes HCTZ and has been non-compliant with her potassium rx. .slight elevation inher ASR. Mild renal insufficiency. Mildly,Elevated glucose. Patient offered pain medication.  Patient will be sent for ct to r.o possible stone as there is some flank involvement and leukocytes in urine. This may account for her pain. Her son is here and became aggressive, threatening stating that we were not taking care of his mother and "didn't care  about black people." I explained that we are trying to take care of many patient's and that  we want to care for all patients regardless of skin color but we have to take care of many pressing matters at the same time. I asked the patient;s son to pleas calm down however he became increasingly more angry  With loud voice. i asked him to calm his behavior or please leave. Patient asked the son to leave and he did willingly without security or police intervention.   The patient's pain has been adequately controlled. I have discussed findings of lab and CT. I have discussed that we will treat the patient symptomatically and she is to follow closely with her pcp. i spent several minutes apologizing to the family about the incident and assuring them that my care as well as that of [place] is never motivated by race or income level. i also discussed the need for calm and order within the ED both for the patient as well as the staff. The patient and her husband apologized for the son's behavior and appeared to understand.   Filed Vitals:   04/08/13 2115 04/08/13 2126 04/09/13 0056  BP:  128/72 163/83  Pulse:  86 75  Temp:  97.8 F (36.6 C)   TempSrc:  Oral   Resp:  18 18  SpO2: 100% 98% 97%    Patient is nontoxic, nonseptic appearing, in no apparent distress.  Patient's pain and other symptoms adequately managed in emergency department.  Fluid bolus given.  Labs, imaging and vitals reviewed.  Patient does not meet the SIRS or Sepsis criteria.  On repeat exam patient does not have a surgical abdomin and there are nor peritoneal signs.  No indication of appendicitis, bowel obstruction, bowel perforation, cholecystitis, diverticulitis  Patient discharged home with symptomatic treatment and given strict instructions for follow-up with their primary care physician.  I have also discussed reasons to return immediately to the ER.  Patient expresses understanding and agrees with plan.        Arthor Captain, PA-C 04/10/13 1243  Arthor Captain, PA-C 04/25/13 [phone]

## 2013-04-08 NOTE — ED Notes (Signed)
Bed: VH84 Expected date: 04/08/13 Expected time: 9:06 PM Means of arrival:  Comments: EMS 52yo F, abd pain hx of GERD

## 2013-04-09 MED ORDER — DICYCLOMINE HCL 20 MG PO TABS: 20.0000 mg | ORAL_TABLET | Freq: Two times a day (BID) | ORAL | Status: DC

## 2013-04-09 MED ORDER — OXYCODONE-ACETAMINOPHEN 5-325 MG PO TABS: 1.0000 | ORAL_TABLET | ORAL | Status: DC | PRN

## 2013-04-10 ENCOUNTER — Ambulatory Visit (INDEPENDENT_AMBULATORY_CARE_PROVIDER_SITE_OTHER): Payer: Medicaid Other | Admitting: Family Medicine

## 2013-04-10 ENCOUNTER — Encounter: Payer: Self-pay | Admitting: Family Medicine

## 2013-04-10 VITALS — BP 130/82 | HR 75 | Temp 97.8°F | Ht 67.0 in | Wt 247.0 lb

## 2013-04-10 DIAGNOSIS — R109 Unspecified abdominal pain: Secondary | ICD-10-CM

## 2013-04-10 NOTE — Patient Instructions (Signed)
Adam,  Thank you for coming in to see me today. Please continue the bentyl.  Please think more and pray about low back injections. I believe it is the next best treatment option.  Inquire up front regarding who your new resident physician will be, ideally another second year resident or intern, so you can have the same doctor for at leat the next 1.5-2.5 years.   Happy holidays.   Dr. Armen Pickup

## 2013-04-10 NOTE — Assessment & Plan Note (Addendum)
A: patient with intermittent abdominal cramps. Range from mild to severe. Normal labs.  P:  Reassurance Repeat BMP at f/u-monitor Cr  No new medications. Patient hinted at refill of BZ will defer to PCP. Recommend increasing gabapentin instead and not adding psychotropic medications if they can be avoided.

## 2013-04-10 NOTE — Progress Notes (Signed)
   Subjective:    Patient ID: Norma [name], female    DOB: 13-Jun-1960, 52 y.o.   MRN: [id number]  HPI 52 yo F presents for same day visit for ED f/u:  1. ED visit 04/08/13: patient seen in ED for acute worsening of intermittent LUQ abdominal pain at rest. Pain was cramping sensation. Associated with total body cramping. She was evaluated. Labs WNL except slightly elevated Cr, CT abdomen negative for acute findings.   Since her ED visit she has been taking bentyl and percocet w/ improvement in symptoms. She still gets intermittent cramps R and L upper quadrants and burning sensation. Mild nausea. No vomiting, fever, diarrhea or constipation. She has tried NSAIDs, mustard and supplemental potassium w/o relief.   Review of Systems AS per HPI Low back pain, knee pain, hip pain, burning sensation in toes     Objective:   Physical Exam BP 130/82  Pulse 75  Temp(Src) 97.8 F (36.6 C) (Oral)  Ht 5\' 7"  (1.702 m)  Wt 247 lb (112.038 kg)  BMI 38.68 kg/m2 General appearance: alert, cooperative and no distress Abdomen: obese, NABS, soft, NT, ND, no masses, negative Murphy sign     Assessment & Plan:

## 2013-04-18 ENCOUNTER — Encounter: Payer: Self-pay | Admitting: Family Medicine

## 2013-04-18 ENCOUNTER — Ambulatory Visit (INDEPENDENT_AMBULATORY_CARE_PROVIDER_SITE_OTHER): Payer: Medicaid Other | Admitting: Family Medicine

## 2013-04-18 VITALS — BP 126/80 | HR 91 | Temp 97.6°F | Ht 67.0 in | Wt 242.0 lb

## 2013-04-18 DIAGNOSIS — M25511 Pain in right shoulder: Secondary | ICD-10-CM

## 2013-04-18 DIAGNOSIS — M25519 Pain in unspecified shoulder: Secondary | ICD-10-CM

## 2013-04-18 NOTE — Patient Instructions (Signed)
It was nice to meet you today.  The best initial treatments for your shoulder is to do the exercises as I printed out for you, ice the shoulder for 10-15 minutes a few times a day, and continue the NSAIDs (ibuprofen, aleve, meloxicam) to decrease the inflammation. It will likely take time for you to heal, but if it isn't getting any better please call the sports medicine clinic with Dr. Jennette Kettle and try to get an appointment there, if not then return to the clinic here.

## 2013-04-18 NOTE — Assessment & Plan Note (Signed)
Pain occurred after fall from bed, landing on outstretched arm. Exam not concerning for fracture or dislocation. Likely has swelling/inflammation of rotator cuff, but due to limited exam from pain cannot completely rule out any tears. At this point I'm holding off on imaging, plan to use ice/NSAIDs in addition to rehab exercises that were printed off here and gone over with her. If pain does not improve she will schedule a sports medicine clinic appointment.

## 2013-04-18 NOTE — Progress Notes (Signed)
   Subjective:    Patient ID: Norma [name], female    DOB: [birth year], 52 y.o.   MRN: [id number]  HPI  CC: Right shoulder pain  Norma [name] is a 52 y.o. female presenting to clinic after she fell out of bed last Friday (6 days ago), landed on outstretched arm and buttocks. Since that time her right shoulder and wrist have been in pain, with the shoulder bothering her more. Fall was from about 2.5 feet. She did not hear any pops after the fall. She is still able to move her arm around, but it is limited in range of motion to pain (unable to easily raise arm). Pain in the shoulder is primarily sharp, on movement is as high as 10/10. Her wrist has gotten better and does not hurt her as much. She has been using OTC medications she has been using for her other joint pains, including aleve and tylenol, in addition to warm compresses. She does not have any numbness, tingling, or pain that shoots down the arm.   Review of Systems No syncope, no fevers/chills, no CP, no SOB, no diarrhea/constipation    Objective:   Physical Exam BP 126/80  Pulse 91  Temp(Src) 97.6 F (36.4 C) (Oral)  Ht 5\' 7"  (1.702 m)  Wt 242 lb (109.77 kg)  BMI 37.89 kg/m2  General: NAD sitting in chair CV: RRR, normal heart sounds Resp: CTAB, effort normal Extremity: Right shoulder without any obvious deformity or ecchymosis. Mild swelling when compared to the left. Only mild tenderness to palpation, no specific area worse than another. Internal/external rotation intact and no pain against resistance. Adduction of the right arm limited to 30-45*. Empty can test positive. Right wrist: no swelling, ecchymosis, or deformity. Nontender to palpation of base of thumb or other parts of wrist. Neuro: strength arm extension/flexion 5/5 bilaterally.    Assessment & Plan:  See Problem List documentation

## 2013-04-20 NOTE — ED Provider Notes (Signed)
Medical screening examination/treatment/procedure(s) were performed by non-physician practitioner and as supervising physician I was immediately available for consultation/collaboration.  EKG Interpretation   None       Rolland Porter, MD, Abram Sander   Janice Norrie, MD 04/20/13 1504

## 2013-04-25 ENCOUNTER — Ambulatory Visit: Payer: Medicaid Other | Admitting: Family Medicine

## 2013-04-26 NOTE — ED Provider Notes (Signed)
Medical screening examination/treatment/procedure(s) were performed by non-physician practitioner and as supervising physician I was immediately available for consultation/collaboration.  EKG Interpretation   None        Rolland Porter, MD, Abram Sander   Janice Norrie, MD 04/26/13 [phone]

## 2013-05-07 ENCOUNTER — Ambulatory Visit (INDEPENDENT_AMBULATORY_CARE_PROVIDER_SITE_OTHER): Payer: Medicaid Other | Admitting: Family Medicine

## 2013-05-07 ENCOUNTER — Encounter: Payer: Self-pay | Admitting: Family Medicine

## 2013-05-07 VITALS — BP 164/96 | HR 76 | Temp 97.5°F | Ht 67.0 in | Wt 248.0 lb

## 2013-05-07 DIAGNOSIS — M25562 Pain in left knee: Secondary | ICD-10-CM

## 2013-05-07 DIAGNOSIS — M25559 Pain in unspecified hip: Secondary | ICD-10-CM

## 2013-05-07 DIAGNOSIS — M25569 Pain in unspecified knee: Secondary | ICD-10-CM

## 2013-05-07 MED ORDER — METFORMIN HCL 500 MG PO TABS: 500.0000 mg | ORAL_TABLET | Freq: Two times a day (BID) | ORAL | Status: DC

## 2013-05-07 MED ORDER — TRAZODONE HCL 100 MG PO TABS: 200.0000 mg | ORAL_TABLET | Freq: Every day | ORAL | Status: DC

## 2013-05-07 MED ORDER — METHYLPREDNISOLONE ACETATE 40 MG/ML IJ SUSP
40.0000 mg | Freq: Once | INTRAMUSCULAR | Status: AC
Start: 2013-05-07 — End: 2013-05-07
  Administered 2013-05-07: 40 mg via INTRA_ARTICULAR

## 2013-05-07 NOTE — Patient Instructions (Addendum)
Norma [name],  Thank you for coming in today. I have given you an injection into your L knee for chronic knee pain. Your hip pain is concerning for referred pain from your back vs trochanteric bursitis.  An injection in your hip can be done for trochanteric bursitis but I recommend that you try the following rehab exercises first.   F/u with new PCP when reassigned to address hip, weight loss concerns and R low back pain.  Dr. Adrian Blackwater     Trochanteric Bursitis You have hip pain due to trochanteric bursitis. Bursitis means that the sack near the outside of the hip is filled with fluid and inflamed. This sack is made up of protective soft tissue. The pain from trochanteric bursitis can be severe and keep you from sleep. It can radiate to the buttocks or down the outside of the thigh to the knee. The pain is almost always worse when rising from the seated or lying position and with walking. Pain can improve after you take a few steps. It happens more often in people with hip joint and lumbar spine problems, such as arthritis or previous surgery. Very rarely the trochanteric bursa can become infected, and antibiotics and/or surgery may be needed. Treatment often includes an injection of local anesthetic mixed with cortisone medicine. This medicine is injected into the area where it is most tender over the hip. Repeat injections may be necessary if the response to treatment is slow. You can apply ice packs over the tender area for 30 minutes every 2 hours for the next few days. Anti-inflammatory and/or narcotic pain medicine may also be helpful. Limit your activity for the next few days if the pain continues. See your caregiver in 5-10 days if you are not greatly improved.  SEEK IMMEDIATE MEDICAL CARE IF:  You develop severe pain, fever, or increased redness.  You have pain that radiates below the knee. EXERCISES STRETCHING EXERCISES - [place]chantic Bursitis  These exercises may help you when beginning to  rehabilitate your injury. Your symptoms may resolve with or without further involvement from your physician, physical therapist or athletic trainer. While completing these exercises, remember:   Restoring tissue flexibility helps normal motion to return to the joints. This allows healthier, less painful movement and activity.  An effective stretch should be held for at least 30 seconds.  A stretch should never be painful. You should only feel a gentle lengthening or release in the stretched tissue. STRETCH  Iliotibial Band  On the floor or bed, lie on your side so your injured leg is on top. Bend your knee and grab your ankle.  Slowly bring your knee back so that your thigh is in line with your trunk. Keep your heel at your buttocks and gently arch your back so your head, shoulders and hips line up.  Slowly lower your leg so that your knee approaches the floor/bed until you feel a gentle stretch on the outside of your thigh. If you do not feel a stretch and your knee will not fall farther, place the heel of your opposite foot on top of your knee and pull your thigh down farther.  Hold this stretch for __________ seconds.  Repeat __________ times. Complete this exercise __________ times per day. STRETCH Hamstrings, Supine   Lie on your back. Loop a belt or towel over the ball of your foot as shown.  Straighten your knee and slowly pull on the belt to raise your injured leg. Do not allow the knee to  bend. Keep your opposite leg flat on the floor.  Raise the leg until you feel a gentle stretch behind your knee or thigh. Hold this position for __________ seconds.  Repeat __________ times. Complete this stretch __________ times per day. STRETCH - Quadriceps, Prone   Lie on your stomach on a firm surface, such as a bed or padded floor.  Bend your knee and grasp your ankle. If you are unable to reach, your ankle or pant leg, use a belt around your foot to lengthen your reach.  Gently pull  your heel toward your buttocks. Your knee should not slide out to the side. You should feel a stretch in the front of your thigh and/or knee.  Hold this position for __________ seconds.  Repeat __________ times. Complete this stretch __________ times per day. STRETCHING - Hip Flexors, Lunge Half kneel with your knee on the floor and your opposite knee bent and directly over your ankle.  Keep good posture with your head over your shoulders. Tighten your buttocks to point your tailbone downward; this will prevent your back from arching too much.  You should feel a gentle stretch in the front of your thigh and/or hip. If you do not feel any resistance, slightly slide your opposite foot forward and then slowly lunge forward so your knee once again lines up over your ankle. Be sure your tailbone remains pointed downward.  Hold this stretch for __________ seconds.  Repeat __________ times. Complete this stretch __________ times per day. STRETCH - Adductors, Lunge  While standing, spread your legs  Lean away from your injured leg by bending your opposite knee. You may rest your hands on your thigh for balance.  You should feel a stretch in your inner thigh. Hold for __________ seconds.  Repeat __________ times. Complete this exercise __________ times per day. Document Released: 05/13/2004 Document Revised: 06/28/2011 Document Reviewed: 07/18/2008 [facility] Patient Information 2014 [place], [place].

## 2013-05-07 NOTE — Progress Notes (Signed)
Subjective:    Patient ID: Norma [name], female    DOB: 17-Feb-1961, 53 y.o.   MRN: [id number]  HPI 53 yo F with with chronic pain in multiple areas including her shoulders and low back presents for same-day visit discuss the following:  #1 left knee pain: Patient reports worsening of chronic knee pain after a fall that occurred 1 month ago. She reports the fall occurred at home. She fell out of bed. She did present to care, but at that time she had shoulder pain without significant knee pain. Since then she reports worsening knee pain. She does swelling, redness and fever. She denies locking and popping. She admits to grinding. She's tried Mobic for pain with only partial temporary relief.  #2 left hip pain: Patient will also with left hip pain x1 month.  pain is lateral left hip. Pain does not radiate down bilaterally. There is no associated numbness. Patient has chronic low back pain but this has not worsened.   #3 insomnia: Patient is taking trazodone for insomnia. She reports it is not working as well as previous day. She inquires   #4 PCP problem: This is become a problem because the patient has been seen multiple times by various providers. She  is refusing to followup with her assigned PCP.  She reports that she put in a PCP change request two months ago. She reports that her PCP "laughed  in her face" when she complained of her pain. She felt that her concerns were dismissed and her PCP lacked compassion. She did not and has not addressed this with her PCP. I discussed with the patient that her PCP is very well-known to me and is one of the most compassionate and thoughtful providers I know. I also reminded her that it took time for [place] to build a therapeutic relationship when I was her PCP.  I discussed with her that the encounter and her interpretation of it could be a misunderstanding or perhaps both the patient and PCP were having an off day. I asked her if she would be willing to give  her PCP a chance to provide care for her, hear her concerns, and very likely apologize for behavior that appeared to to be uncaring. Initially, patient at first said that she was not willing, but said that she would think about it.    Review of Systems As per HPI    Objective:   Physical Exam BP 164/96  Pulse 76  Temp(Src) 97.5 F (36.4 C) (Oral)  Ht 5\' 7"  (1.702 m)  Wt 248 lb (112.492 kg)  BMI 38.83 kg/m2 General appearance: alert, cooperative, no distress and moderately obese, walks with a cane Hip: TTP lateral hip over great trochanter. Negative FABER. Negative log roll. Full ROM. 5/5 strength  Knee: no effusion. No erythema. Non tender to palpation. Full ROM. Medial knee pain with flexion. Negative patellar grind. Negative lachman and valgus/varus stress test.   After obtain informed consent a steroid injection was performed at L knee using anterolateral approach  using 4 ml of 1% plain Lidocaine and  1 ml of 76ml/40 mg of Depo Medrol. This was done using standard sterile technique. This was well tolerated.      Assessment & Plan:   PCP problem:  Discussed with Dr. Dianah Field who is very willing to provide care for Norma [name].  Discussed with Elray Mcgregor who will discuss the issue further with Norma [name] and come to a decision regarding whether or note a  PCP change is appropriate.  If a PCP change is not granted a consorted effort with the front office staff should be made to schedule Norma [name] with her PCP as much as possible.

## 2013-05-08 ENCOUNTER — Ambulatory Visit (INDEPENDENT_AMBULATORY_CARE_PROVIDER_SITE_OTHER): Payer: Medicaid Other | Admitting: Family Medicine

## 2013-05-08 ENCOUNTER — Encounter: Payer: Self-pay | Admitting: Family Medicine

## 2013-05-08 ENCOUNTER — Telehealth: Payer: Self-pay | Admitting: *Deleted

## 2013-05-08 VITALS — BP 162/110 | HR 71 | Temp 98.2°F | Wt 246.0 lb

## 2013-05-08 DIAGNOSIS — L600 Ingrowing nail: Secondary | ICD-10-CM

## 2013-05-08 DIAGNOSIS — R42 Dizziness and giddiness: Secondary | ICD-10-CM

## 2013-05-08 DIAGNOSIS — F339 Major depressive disorder, recurrent, unspecified: Secondary | ICD-10-CM

## 2013-05-08 DIAGNOSIS — M549 Dorsalgia, unspecified: Secondary | ICD-10-CM

## 2013-05-08 LAB — CBC
HEMATOCRIT: 35.7 % — AB (ref 36.0–46.0)
Hemoglobin: 11.9 g/dL — ABNORMAL LOW (ref 12.0–15.0)
MCH: 27.5 pg (ref 26.0–34.0)
MCHC: 33.3 g/dL (ref 30.0–36.0)
MCV: 82.6 fL (ref 78.0–100.0)
PLATELETS: 213 10*3/uL (ref 150–400)
RBC: 4.32 MIL/uL (ref 3.87–5.11)
RDW: 16.4 % — AB (ref 11.5–15.5)
WBC: 6.7 10*3/uL (ref 4.0–10.5)

## 2013-05-08 MED ORDER — HYDROXYZINE HCL 10 MG PO TABS: ORAL_TABLET | ORAL | Status: DC

## 2013-05-08 MED ORDER — DILTIAZEM HCL ER 60 MG PO CP12: 60.0000 mg | ORAL_CAPSULE | Freq: Two times a day (BID) | ORAL | Status: DC

## 2013-05-08 NOTE — Telephone Encounter (Signed)
Patient completed form to request PCP change on 03/12/13.  Form states "Have been in the worst pain since I started with new practitioner.  Not trying to help me feel better.  Needs female doctor, Dr. Adrian Blackwater."  Dr. Adrian Blackwater not currently accepting new patients due to [facility]. Faculty fellowship ending in 09/2013.  Patient had recent office visit with Dr. Adrian Blackwater and willing to see Dr. Dianah Field one more time and see if "issues" can be resolved before changing PCP.  Patient states Dr. Dianah Field "took me off all of my pain medications.  I need these meds to be able to get around day-to-day.  I am terrified of going to get an injection and having someone stick something in my spine."  Scheduled appt with PCP for 05/18/13 at 10:45 am to discuss pain management regimen/options.  Patient will follow up with me afterwards regarding discussing possible PCP change.  Nolene Ebbs, RN

## 2013-05-08 NOTE — Assessment & Plan Note (Signed)
A: recurrent R flank pain with trigger point P: reassurance F/u with PCP consider repeat injection.

## 2013-05-08 NOTE — Assessment & Plan Note (Signed)
A: Acutely worsening chronic left hip pain. Differentials include trochanteric bursitis versus left hip osteoporosis versus referred pain from the back. No tenderness over greater trochanter trochanteric bursitis most likely. Patient had discussed utility of corticosteroid injection into the bursa patient is interested but is willing to try conservative therapy first.  P: Conservative therapy with stretches and ice. Followup with PCP once determine PCP will be. Consider left hip injection for trochanteric bursitis if pain persists.

## 2013-05-08 NOTE — Telephone Encounter (Signed)
I agree with this plan. Thank you Jeanett Schlein.

## 2013-05-08 NOTE — Assessment & Plan Note (Addendum)
A: unclear etiology. Possible related to HTN. Patient HTN yesterday and more so today. Exam reassuring. Gait normal with cane.  P: CBC and BMP to eval for anemia and possible dehydration.  advised patient to take diltiazem PCP f/u.  Consider orthostatic vital signs and EKG if symptoms persist (celexa can cause QT prolongation).

## 2013-05-08 NOTE — Progress Notes (Signed)
   Subjective:    Patient ID: Norma [name], female    DOB: [birth year], 53 y.o.   MRN: [id number]  HPI 53 yo F presents for same day visit with following concerns:  1. Crack in L great toenail: occurred 3 weeks ago. No bruising. No nail avulsion. Moderate soreness and burning sensation. Unsure of what to do. S/p L great lateral toenail removal.   2. Dizziness: off and on for past week. Felt dizzy and presyncopal last night. Associated with nausea and heavy head feeling. Did not take BP medication today bc of this. Denies associated syncope, chest pain, SOB.  Denies preceding fever, diarrhea, vomiting. Slept well last night but previously has not been sleeping well.   3. Depression: reports worsening depressed mood. Taking celexa. Denies SI and HI. Plans to go to [facility] tomorrow.   4. R flank pain: off and on localized area of pain.  Chronic. No dyuria, N/V/Fever. History of trigger point s/p injection at site.   Review of Systems As per HPI     Objective:   Physical Exam BP 162/110  Pulse 71  Temp(Src) 98.2 F (36.8 C) (Oral)  Wt 246 lb (111.585 kg) General appearance: alert, cooperative and no distress Head: Normocephalic, without obvious abnormality, atraumatic Back: symmetric, no curvature. ROM normal. No CVA tenderness. palpable localized area of tenderness in R flank.  Lungs: clear to auscultation bilaterally Heart: regular rate and rhythm, S1, S2 normal, no murmur, click, rub or gallop Extremities: L  great toe nail with crack in nail. no ecchymoses. no ingrown area. no erythema or edema.        Assessment & Plan:

## 2013-05-08 NOTE — Assessment & Plan Note (Addendum)
A: s/p lateral toenail removal. Now with nail fissure (incomplete avulsion) P: reassurance Warm water soaks RTC for clipping as nail grows.

## 2013-05-08 NOTE — Patient Instructions (Signed)
Norma [name],  Thank you for coming in today. I have refilled hydroxyzine and diltiazem.  We will get blood work today and I will be in touch with results.  Soak your L great toe. As it grows out there is a good chance it will fall off. As it grows come back to clinic to get it clipped.   I agree that you depression sounds worsened. I agree with mental health tomorrow.   Please f/u with Dr. Dianah Field to re-establish care and address your R side pain.  Dr. Adrian Blackwater

## 2013-05-08 NOTE — Assessment & Plan Note (Signed)
A: declined. P: Patient to f/u with psych On celexa and trazadone. Consider SNRI trial if celexa no longer benefiting.

## 2013-05-08 NOTE — Assessment & Plan Note (Signed)
A: recurrent medial L knee pain following fall out of bed. No joint instability on exam. Full ROM. Suspect moderate medial compartment OA.   P: Corticosteroid injection done in office today.

## 2013-05-09 ENCOUNTER — Telehealth: Payer: Self-pay | Admitting: *Deleted

## 2013-05-09 LAB — BASIC METABOLIC PANEL
BUN: 12 mg/dL (ref 6–23)
CO2: 26 mEq/L (ref 19–32)
Calcium: 9.2 mg/dL (ref 8.4–10.5)
Chloride: 104 mEq/L (ref 96–112)
Creat: 0.75 mg/dL (ref 0.50–1.10)
GLUCOSE: 102 mg/dL — AB (ref 70–99)
POTASSIUM: 4.1 meq/L (ref 3.5–5.3)
Sodium: 138 mEq/L (ref 135–145)

## 2013-05-09 NOTE — Telephone Encounter (Signed)
LM for pt to call back.  Please give message from Dr. Adrian Blackwater. Jazmin Hartsell,CMA

## 2013-05-09 NOTE — Telephone Encounter (Signed)
Message copied by Valerie Roys on Wed May 09, 2013  5:52 PM ------      Message from: Boykin Nearing      Created: Wed May 09, 2013  5:07 PM       Normal labs.      Very mild normocytic anemia on blood drawn to evaluate dizziness.       Please inform patient.        ------

## 2013-05-18 ENCOUNTER — Ambulatory Visit: Payer: Medicaid Other | Admitting: Family Medicine

## 2013-05-21 ENCOUNTER — Ambulatory Visit (INDEPENDENT_AMBULATORY_CARE_PROVIDER_SITE_OTHER): Payer: Medicaid Other | Admitting: Family Medicine

## 2013-05-21 ENCOUNTER — Encounter: Payer: Self-pay | Admitting: Family Medicine

## 2013-05-21 VITALS — BP 129/77 | HR 88 | Ht 67.0 in | Wt 246.0 lb

## 2013-05-21 DIAGNOSIS — M25562 Pain in left knee: Secondary | ICD-10-CM

## 2013-05-21 DIAGNOSIS — M25569 Pain in unspecified knee: Secondary | ICD-10-CM

## 2013-05-21 DIAGNOSIS — G894 Chronic pain syndrome: Secondary | ICD-10-CM

## 2013-05-21 NOTE — Progress Notes (Signed)
Norma [name] is a 53 y.o. female who presents to [facility] today for L knee pain.  L knee pain: ongoing problem for pt. Multiple injections in past w/ most recent on 05/07/13 w/ only a few hours of relief. Performing leg exercises w/o relief. Taking Aleve, tylenol, and gabapentin w/ some relief. Worse at night. Knee gives out on pt about 1x mo. Falls bring on knee pain. Hears clicking/poping and grinding.   L hip pain: present for 2-62mo. Worse after prolonged ambulation.   Xrays from 10/14: Early degenerative changes bilaterally, left slightly greater than right. No acute findings.    PMH reviewed.  ROS as above otherwise neg Medications reviewed.  Exam:  BP 129/77  Pulse 88  Ht 5\' 7"  (1.702 m)  Wt 246 lb (111.585 kg)  BMI 38.52 kg/m2 Gen: Well NAD MSK: FROM of knees and hips bilat. Nonttp. No effusions. Lachmans, McMurrays, Valgus and Varus stresses w/o pain. No crepitus. + Pattellar grind on L.  Pain to palpation of soft tissue and deep structures of the greater trochanter on the L. No pain w/ internal/external rotation flextion or extension of the hip.           Leg length equal  Assessment and Plan: 1) L knee pain. 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 Previous Xray w/o substantial evidence for OA. Pt also w/ h/o chronic pain syndrome that may be contributing - continue Meloxicam (pt taking 7.5 mg w/ the occasional Aleve PM for sleep which is ok. Discussed proper dosing) - Discussed quadricept strengthening exercises.  - MRI L knee w/ f/u in 4 wks  L hip pain likely from change in gait from knee pain and may be resulting in greater trochanter bursitis. Will consider injection at next appt if continues to be a concern (just received steroid inj for L knee on 05/07/13). No evidence for hip joint involvement.   Linna Darner, MD Family Medicine PGY-3 05/21/2013, 2:21 PM

## 2013-05-21 NOTE — Patient Instructions (Addendum)
Thank you for coming in today You are doing well overall Please perform you exercises 2 times a day Please take the meloxicam daily and you may occasionally use a PM aleve if needed at night Please go have your MRI performed Please come back in 4 weeks.  Have a wonderful day.

## 2013-05-21 NOTE — Progress Notes (Signed)
[place] Attending Note: I have seen and examined this patient. I have discussed this patient with the resident and reviewed the assessment and plan as documented above. I agree with the resident's findings and plan. I suspect most of the issues with her knee are in the patellofemoral compartment. I think the only way to further evaluate this given her concurrent medical issues his MRI so we will set that up we'll see her back afterwards.

## 2013-05-24 ENCOUNTER — Encounter: Payer: Self-pay | Admitting: Family Medicine

## 2013-05-24 ENCOUNTER — Ambulatory Visit (INDEPENDENT_AMBULATORY_CARE_PROVIDER_SITE_OTHER): Payer: Medicaid Other | Admitting: Family Medicine

## 2013-05-24 VITALS — BP 116/54 | HR 94 | Temp 98.4°F | Wt 245.5 lb

## 2013-05-24 DIAGNOSIS — J069 Acute upper respiratory infection, unspecified: Secondary | ICD-10-CM

## 2013-05-24 MED ORDER — DICYCLOMINE HCL 20 MG PO TABS: 20.0000 mg | ORAL_TABLET | Freq: Two times a day (BID) | ORAL | Status: DC

## 2013-05-24 MED ORDER — GUAIFENESIN-CODEINE 100-10 MG/5ML PO SOLN: 5.0000 mL | Freq: Four times a day (QID) | ORAL | Status: DC | PRN

## 2013-05-24 MED ORDER — POLYETHYLENE GLYCOL 3350 17 G PO PACK
17.0000 g | PACK | Freq: Every day | ORAL | Status: DC | PRN
Start: 2013-05-24 — End: 2014-04-20

## 2013-05-24 NOTE — Patient Instructions (Signed)
Upper Respiratory Infection The most important thing is to get a lot of rest and stay well hydrated.  If you develop fevers, worsening of symptoms beyond the time we discussed (10 days), worsening of symptoms after they get better, please schedule a follow up visit.   For fever or pain-use tylenol as needed For cough- try the cough syrup we gave you.  For congestion-try a neti pot and make sure to follow instructions for water  Talk to Dr. Darene Lamer about the following: Health Maintenance Due  Topic Date Due  . Colonoscopy  11/29/2008  . Tetanus/tdap  01/18/2012

## 2013-05-25 NOTE — Progress Notes (Signed)
Norma Reddish, MD Phone: [phone]  Subjective:  Chief complaint-noted  Upper Respiratory Infection Patient complains of symptoms of a URI. Symptoms include congestion, coryza, cough described as nonproductive and post nasal drip. Onset of symptoms was 5 days ago, and has been stable since that time. Treatment to date: alk seltzer plus and cough syrup. Rest. Water, giner ale and orange juice.  ROS- does get headache with this up to 10/10. Has some pain in her outter ribs when coughing and that is most bothersome symptom. Throat scratchy but no sore throat. Occasional chill. Occasional nausea.    Past Medical History- history of chronic pain, history of headaches, depression, obesity, hypertension Medications- reviewed and updated Current Outpatient Prescriptions  Medication Sig Dispense Refill  . acetaminophen (TYLENOL) 650 MG CR tablet Take 1 tablet (650 mg total) by mouth every 8 (eight) hours.  30 tablet  3  . citalopram (CELEXA) 40 MG tablet Take 1 tablet (40 mg total) by mouth daily. (mental health)  90 tablet  2  . dicyclomine (BENTYL) 20 MG tablet Take 1 tablet (20 mg total) by mouth 2 (two) times daily.  20 tablet  0  . diltiazem (CARDIZEM SR) 60 MG 12 hr capsule Take 1 capsule (60 mg total) by mouth 2 (two) times daily.  180 capsule  3  . EPINEPHrine (EPIPEN) 0.3 mg/0.3 mL SOAJ Inject 0.3 mLs (0.3 mg total) into the muscle once.  1 Device  1  . fish oil-omega-3 fatty acids 1000 MG capsule Take 2 g by mouth daily.      Marland Kitchen gabapentin (NEURONTIN) 300 MG capsule Take 1-2 capsules (300-600 mg total) by mouth 3 (three) times daily.  180 capsule  1  . hydrochlorothiazide (HYDRODIURIL) 25 MG tablet Take 1 tablet (25 mg total) by mouth daily.  90 tablet  3  . hydrOXYzine (ATARAX/VISTARIL) 10 MG tablet TAKE 1 TABLET BY MOUTH 3 TIMES DAILY AS NEEDED FOR ITCHING  30 tablet  2  . meloxicam (MOBIC) 7.5 MG tablet Take 1-2 tablets (7.5-15 mg total) by mouth daily.  30 tablet  0  . metFORMIN  (GLUCOPHAGE) 500 MG tablet Take 1 tablet (500 mg total) by mouth 2 (two) times daily with a meal.  180 tablet  0  . omeprazole (PRILOSEC) 20 MG capsule Take 1 capsule (20 mg total) by mouth daily.  30 capsule  3  . polyethylene glycol (MIRALAX / GLYCOLAX) packet Take 17 g by mouth daily as needed (CONSTIPATION).  14 each  1  . traZODone (DESYREL) 100 MG tablet Take 2 tablets (200 mg total) by mouth at bedtime.  60 tablet  3  . guaiFENesin-codeine 100-10 MG/5ML syrup Take 5 mLs by mouth every 6 (six) hours as needed for cough.  120 mL  1   No current facility-administered medications for this visit.    Objective: BP 116/54  Pulse 94  Temp(Src) 98.4 F (36.9 C) (Oral)  Wt 245 lb 8 oz (111.358 kg) Gen: NAD, resting comfortably HEENT: nares erythematous and swollen, oropharynx normal without pharyngeal exudate, TM normal bilaterally, Mucous membranes are moist. Mild sinus tenderness.  CV: RRR no murmurs rubs or gallops Lungs: CTAB no crackles, wheeze, rhonchi Abdomen: soft/nontender/nondistended/normal bowel sounds. No rebound or guarding.  Ext: no edema Skin: warm, dry Neuro: grossly normal, moves all extremities  Assessment/Plan:  Upper Respiratory Infection Advised of symptomatic care (see AVS).  Cough worst symptom given associated rib pain so provided with codeine cough syrup.  Doubt influenza as afebrile Given reasons for  return including signs of bacterial sinusitis indications  Patient also requested refills on bentyl and miralax which were provided.   Meds ordered this encounter  Medications  . dicyclomine (BENTYL) 20 MG tablet    Sig: Take 1 tablet (20 mg total) by mouth 2 (two) times daily.    Dispense:  20 tablet    Refill:  0  . polyethylene glycol (MIRALAX / GLYCOLAX) packet    Sig: Take 17 g by mouth daily as needed (CONSTIPATION).    Dispense:  14 each    Refill:  1  . guaiFENesin-codeine 100-10 MG/5ML syrup    Sig: Take 5 mLs by mouth every 6 (six) hours as  needed for cough.    Dispense:  120 mL    Refill:  1

## 2013-05-27 ENCOUNTER — Inpatient Hospital Stay: Admission: RE | Admit: 2013-05-27 | Payer: Medicaid Other | Source: Ambulatory Visit

## 2013-05-30 ENCOUNTER — Ambulatory Visit (INDEPENDENT_AMBULATORY_CARE_PROVIDER_SITE_OTHER): Payer: Medicaid Other | Admitting: Family Medicine

## 2013-05-30 ENCOUNTER — Encounter: Payer: Self-pay | Admitting: Family Medicine

## 2013-05-30 VITALS — BP 142/90 | HR 80 | Temp 98.2°F | Ht 67.0 in | Wt 249.0 lb

## 2013-05-30 DIAGNOSIS — R109 Unspecified abdominal pain: Secondary | ICD-10-CM

## 2013-05-30 DIAGNOSIS — M25519 Pain in unspecified shoulder: Secondary | ICD-10-CM

## 2013-05-30 DIAGNOSIS — M25511 Pain in right shoulder: Secondary | ICD-10-CM

## 2013-05-30 MED ORDER — MELOXICAM 15 MG PO TABS: 7.5000 mg | ORAL_TABLET | Freq: Every day | ORAL | Status: DC

## 2013-05-30 NOTE — Progress Notes (Addendum)
Patient ID: Norma [name], female   DOB: 09-20-1960, 53 y.o.   MRN: [id number] Subjective:   CC: Right shoulder pain follow-up  HPI:   Right shoulder pain follow-up Patient was here 12/31 after falling from her bed onto her outstretched hand. At that visit, there was concern for rotator cuff involvement and patient was managed initially conservatively, with ROM exercises and mobic.  -She is doing these exercises and taking mobic 15mg  daily, both of which help.  - However, the shoulder is still very painful (10/10) on right lateral shoulder, causing her to toss and turn, worse at the end of the day, if she lays on her right side, worse with ROM above 90 degrees (abduction or fwd flexion).  - She thinks she has full ROM despite the pain, but is limited in it.  - She denies erythema or swelling.  - She has a rotator cuff tear on left shoulder and wants to make sure she does not have the same issue on the right.  - She denies numbness/tingling.  Recent URI 2/5 - Patient states symptoms were severe, but have significantly improved. Only remaining symptom is itchy throat with no dyspnea or dysphagia.  Other recent issues -  - Chronic right-sided pain - seen for this 1/19, h/o trigger point injection at site. Percoceet last filled 04/09/13 20 tabs in ED. - Depression - Seen for this 1/20, stress from son and daughter, daughter has h/o DV, Bree reports taking on lots of this burden. Encouraged at that visit to follow-up with [facility]. - Left knee pain - Seen 2/2 at sports medicine after steroid injection 1/19 in [facility], thought likely patello-femoral syndrome. Meloxicam. Discussed quadricept strengthening and ordered MRI L knee that patient plans to go to in a few days. Consider injection next appt if continues to be problematic. - L hip pain - Seen 1/19 at [facility] and 2/2 at [facility], thought likely back referred pain or trochanteric bursitis from change in gait due to knee pain. Consider injection.  Review of  Systems - Per HPI. Additionally, patient reports cramping that is intermittent but improved with bentyl. She was seen for this in the ED 04/08/13 and had a workup that was relatively WNL (abdominal CT, BMET) with a nonacute exam. Cramping is reportedly chronic and has improved since ED visit but is still present.  PMH: Medicines reviewed: Gabapentin helps  Finished with guafenesin-codeine cough syrup  Smoking status: not reviewed    Objective:  Physical Exam BP 142/90  Pulse 80  Temp(Src) 98.2 F (36.8 C) (Oral)  Ht 5\' 7"  (1.702 m)  Wt 249 lb (112.946 kg)  BMI 38.99 kg/m2 GEN: NAD, well-appearing, well-groomed HEENT: Atraumatic, normocephalic, neck supple, EOMI, sclera clear  PULM: normal effort SKIN: No rash or cyanosis; warm and well-perfused EXTR: Right shoulder with no erythema or swelling; Right shoulder tender along bicipital groove, ROM limited by pain to 90 degrees forward flexion and abduction, positive empty can sign, tender with extension and internal rotation, tender along shoulder blade; normal sensation bilateral shoulders/arms. PSYCH: Mood and affect euthymic, normal rate and volume of speech NEURO: Awake, alert, no focal deficits grossly, normal speech    Assessment:     DILCIA RYBARCZYK is a 53 y.o. female with h/o fall on outstretched hand here for right shoulder pain.    Plan:     # See problem list and after visit summary for problem-specific plans. - Of note, BP is mildly elevated today, likely due to pain. Will need  to f/u. - Of note, mentioned throat itching especially at night despite eating dinner early. Denies difficulty eating or swallowing or breathing. Recommended continuing early dinner and discussing with me at separate visit if becomes a recurrent issue.  # PCP problem - Patient and I discussed her complaints about me. I apologized for any misunderstanding that would make her feel I was not taking her medical complaints seriously. She has decided to  continue with me as PCP for now. - I told her I was happy to hear this and happy to care for her, but that she is always free to bring up if she wants to switch providers for any reason. I reiterated the importance of the doctor-patient trust relationship, and that if it was a reasonable request, the front office facilitates this once for that very reason. She voiced understanding and seemed happy to hear this.  # Health Maintenance: Not discussed  Follow-up: Follow up in ~2-4weeks after evaluation by sports medicine for right shoulder.   [name], MD [place]

## 2013-05-30 NOTE — Patient Instructions (Signed)
It was good to see you.  I am ordering a right shoulder x-ray. I will call you if this is NOT normal. I want you to call and schedule an appointment with sports medicine for your right shoulder, to consider doing an ultrasound to look for rotator cuff injury. They can also decide if MRI would be helpful. Continue mobic daily with food. Watch for signs of gastritis, and do not take any other anti-inflammatory meds with it like ibuprofen/advil. Continue heat therapy with a heating pad or hot bath.  [name], MD

## 2013-05-31 ENCOUNTER — Encounter: Payer: Self-pay | Admitting: Family Medicine

## 2013-05-31 NOTE — Assessment & Plan Note (Signed)
DDx includes biceps tendonopathy given tenderness over bicipital groove, rotator cuff tear (partial or complete), or osteoarthritis. No numbness. - Right shoulder x-ray to evaluate for arthritis. - Call to schedule sports medicine appointment for right shoulder, consider ultrasound. - Asked patient to think about whether or not she would want surgery, as this would help decide if MRI is or is nto warranted. - Mobic 15mg  1-2 times daily with food. Discussed signs of gastritis. Avoid other NSAIDs. - Continue heat therapy. - F/u with me after sports medicine PRN.

## 2013-05-31 NOTE — Assessment & Plan Note (Signed)
Generalized, chronic; Most recent BMETs with normal K and 04/08/13 abdominal CT with no acute findings and few low attenuation foci noted within the uterus; also with h/o chronic pain. Improved symptoms with bentyl and gabapentin. - Continue bentyl PRN. - Exercise/stretching/move daily. - Return prn worsening of chronic pain.

## 2013-06-06 ENCOUNTER — Ambulatory Visit
Admission: RE | Admit: 2013-06-06 | Discharge: 2013-06-06 | Disposition: A | Discharge status: Home / Self Care | Payer: Medicaid Other | Source: Ambulatory Visit | Attending: Family Medicine | Admitting: Family Medicine

## 2013-06-06 ENCOUNTER — Encounter: Payer: Self-pay | Admitting: Family Medicine

## 2013-06-06 ENCOUNTER — Ambulatory Visit (INDEPENDENT_AMBULATORY_CARE_PROVIDER_SITE_OTHER): Payer: Medicaid Other | Admitting: Family Medicine

## 2013-06-06 VITALS — BP 146/93 | HR 85 | Temp 98.4°F | Wt 250.0 lb

## 2013-06-06 DIAGNOSIS — M25562 Pain in left knee: Secondary | ICD-10-CM

## 2013-06-06 DIAGNOSIS — M549 Dorsalgia, unspecified: Secondary | ICD-10-CM | POA: Insufficient documentation

## 2013-06-06 DIAGNOSIS — I10 Essential (primary) hypertension: Secondary | ICD-10-CM

## 2013-06-06 MED ORDER — METHOCARBAMOL 500 MG PO TABS: 500.0000 mg | ORAL_TABLET | Freq: Three times a day (TID) | ORAL | Status: DC | PRN

## 2013-06-06 MED ORDER — MELOXICAM 15 MG PO TABS: 7.5000 mg | ORAL_TABLET | Freq: Every day | ORAL | Status: DC

## 2013-06-06 NOTE — Progress Notes (Signed)
Norma [name] is a 52 y.o. female who presents to [facility] today for L back pain  H/o back pain but new onset L lumbar back pain. Started 3 days ago. Some radiating to thoraci region and shoulder blade. Dull to sharp in nature. Worse w/ laying down. Improves w/ ambulation. Daily soft BMs. Denies dysuria, frequency, fevers. Denies any change in daily routine lately and denies trauma. Denies loss of bowel or bladder function. No change in sensation of lower extremity. Has not filled Rx for meloxicam. Vicodin and aleve w/ some benefit.   HTN: Taking medications as prescribed. Denies CP, SOB, syncope, palpitations.    The following portions of the patient's history were reviewed and updated as appropriate: allergies, current medications, past medical history, family and social history, and problem list.  Patient is a nonsmoker  Past Medical History  Diagnosis Date  . Hypertension   . Acid reflux   . Tachyarrhythmia   . Left shoulder pain   . Back pain   . Trigger point of thoracic region 03/22/2012  . Irregular heart beat     ROS as above otherwise neg.    Medications reviewed. Current Outpatient Prescriptions  Medication Sig Dispense Refill  . acetaminophen (TYLENOL) 650 MG CR tablet Take 1 tablet (650 mg total) by mouth every 8 (eight) hours.  30 tablet  3  . citalopram (CELEXA) 40 MG tablet Take 1 tablet (40 mg total) by mouth daily. (mental health)  90 tablet  2  . dicyclomine (BENTYL) 20 MG tablet Take 1 tablet (20 mg total) by mouth 2 (two) times daily.  20 tablet  0  . diltiazem (CARDIZEM SR) 60 MG 12 hr capsule Take 1 capsule (60 mg total) by mouth 2 (two) times daily.  180 capsule  3  . EPINEPHrine (EPIPEN) 0.3 mg/0.3 mL SOAJ Inject 0.3 mLs (0.3 mg total) into the muscle once.  1 Device  1  . fish oil-omega-3 fatty acids 1000 MG capsule Take 2 g by mouth daily.      Marland Kitchen gabapentin (NEURONTIN) 300 MG capsule Take 1-2 capsules (300-600 mg total) by mouth 3 (three) times daily.  180 capsule   1  . hydrochlorothiazide (HYDRODIURIL) 25 MG tablet Take 1 tablet (25 mg total) by mouth daily.  90 tablet  3  . hydrOXYzine (ATARAX/VISTARIL) 10 MG tablet TAKE 1 TABLET BY MOUTH 3 TIMES DAILY AS NEEDED FOR ITCHING  30 tablet  2  . meloxicam (MOBIC) 15 MG tablet Take 0.5-1 tablets (7.5-15 mg total) by mouth daily.  30 tablet  0  . metFORMIN (GLUCOPHAGE) 500 MG tablet Take 1 tablet (500 mg total) by mouth 2 (two) times daily with a meal.  180 tablet  0  . methocarbamol (ROBAXIN) 500 MG tablet Take 1 tablet (500 mg total) by mouth every 8 (eight) hours as needed for muscle spasms.  30 tablet  0  . omeprazole (PRILOSEC) 20 MG capsule Take 1 capsule (20 mg total) by mouth daily.  30 capsule  3  . polyethylene glycol (MIRALAX / GLYCOLAX) packet Take 17 g by mouth daily as needed (CONSTIPATION).  14 each  1  . traZODone (DESYREL) 100 MG tablet Take 2 tablets (200 mg total) by mouth at bedtime.  60 tablet  3   No current facility-administered medications for this visit.    Exam: BP 146/93  Pulse 85  Temp(Src) 98.4 F (36.9 C) (Oral)  Wt 250 lb (113.399 kg) Gen: Well NAD HEENT: EOMI,  MMM MSK: FROM.  Moves w/o difficulty w/o cane. Minimal pain on palpation of the L upper lumbar spine to thoracic spine of the perispinal muscles. No bony abnormality    No results found for this or any previous visit (from the past 72 hour(s)).  A/P (as seen in Problem list)  HYPERTENSION, BENIGN SYSTEMIC Elevated today likely from pain No change.  F/u w/ pcp  Back pain Muscle spasm is most likely dx for todays pain complaint vs MSK pain from possible muscle strain in obese individual.  No evidence for UTI/pyelo, renal stones, constipation Reviewed previous CT w/ pt and provided reasurrance represcribed meloxicam as pt stated some issue w/ previous. Start Robaxin (allergy to nortriptyline ). Encouraged active lifestyle and wt loss w/ daily stretching

## 2013-06-06 NOTE — Assessment & Plan Note (Addendum)
Muscle spasm is most likely dx for todays pain complaint vs MSK pain from possible muscle strain in obese individual.  No evidence for UTI/pyelo, renal stones, constipation Reviewed previous CT w/ pt and provided reasurrance represcribed meloxicam as pt stated some issue w/ previous. Start Robaxin (allergy to nortriptyline ). Encouraged active lifestyle and wt loss w/ daily stretching

## 2013-06-06 NOTE — Patient Instructions (Signed)
You likely are experiencing a muscle spasm or muscle strain Please start the meloxicam (do not take ibuprofen, advil, aleve,naprozyn while on this medications) Please also start robaxin to release your spasm Please try to get daily massage, heat treatments, stretching, and try to lose weight.

## 2013-06-06 NOTE — Assessment & Plan Note (Signed)
Elevated today likely from pain No change.  F/u w/ pcp

## 2013-06-08 ENCOUNTER — Encounter: Payer: Self-pay | Admitting: Family Medicine

## 2013-06-08 NOTE — Progress Notes (Signed)
Patient ID: Norma [name], female   DOB: 07/07/1960, 53 y.o.   MRN: [id number] MRI shows no significant fissuring and cyst formation underside patella. I sent her a letter about this. She made some point might consider some orthopedic valuation. Also sent her some information about patellofemoral arthritis and options. The happy see her back at any time. I am not sure there is a whole lot  we can add for  treatment of this issue ---might benefit ortho eval (chondroplasty or patellar arthrroplasty). I sent her info on both.

## 2013-06-11 ENCOUNTER — Ambulatory Visit (INDEPENDENT_AMBULATORY_CARE_PROVIDER_SITE_OTHER): Payer: Medicaid Other | Admitting: Family Medicine

## 2013-06-11 ENCOUNTER — Encounter: Payer: Self-pay | Admitting: Family Medicine

## 2013-06-11 VITALS — BP 145/91 | HR 73 | Ht 67.0 in | Wt 250.0 lb

## 2013-06-11 DIAGNOSIS — M259 Joint disorder, unspecified: Secondary | ICD-10-CM

## 2013-06-11 DIAGNOSIS — M19019 Primary osteoarthritis, unspecified shoulder: Secondary | ICD-10-CM

## 2013-06-11 MED ORDER — METHYLPREDNISOLONE ACETATE 40 MG/ML IJ SUSP
40.0000 mg | Freq: Once | INTRAMUSCULAR | Status: AC
  Administered 2013-06-11: 40 mg via INTRA_ARTICULAR

## 2013-06-11 NOTE — Progress Notes (Signed)
Norma [name] is a 53 y.o. female who presents to [facility] today for R shoulder pain  R shoulder pain: started in Dec. Fell from bed and landed on R shoulder. Mattress sits about 2-108f from floor. Wood floor in home. No pop. Sharp and dull in nature. Pain is worse w/ certain movements especially putting behind back. Seen by [facility] physician and tried exercises, but no improvement in that time. Meloxicam w/o benefit. Denies any radiation of pain or change in sensation or strength. Occasion catching w/ lifting type movements.    PMH reviewed.  ROS as above otherwise neg Medications reviewed.  Exam:  BP 145/91  Pulse 73  Ht 5\' 7"  (1.702 m)  Wt 250 lb (113.399 kg)  BMI 39.15 kg/m2 Gen: Well NAD MSK: ROM of R limited to 90 degrees on abduction and flexion. Passive ROM full but painful. L FROM. ttp along AC joint. Hawking + bilat. EMpty can neg. Unable to put R hand behind back.   Mr Knee Left  Wo Contrast  06/06/2013   CLINICAL DATA:  Anterior left knee pain for 6 months.  EXAM: MRI OF THE LEFT KNEE WITHOUT CONTRAST  TECHNIQUE: Multiplanar, multisequence MR imaging of the knee was performed. No intravenous contrast was administered.  COMPARISON:  Plain films of the left knee 02/07/2013.  FINDINGS: MENISCI  Medial meniscus:  Intact.  Lateral meniscus:  Intact.  LIGAMENTS  Cruciates:  Intact.  Collaterals: Intact. Ossification at the superior attachment of the medial collateral ligament is consistent with old injury.  CARTILAGE  Patellofemoral: There is extensive fibrillation and irregularity of hyaline cartilage of the patella and medial trochlea. Associated subchondral cyst formation is noted.  Medial:  Minimally degenerated.  Lateral:  Unremarkable.  Joint:  Small joint effusion is present.  Popliteal Fossa:  No Baker's cyst.  Extensor Mechanism:  Intact.  Bones:  As described above.  No focal lesion or stress change.  IMPRESSION: Negative for meniscal or ligament tear.  Moderate patellofemoral osteoarthritis.    Electronically Signed   By: Inge Rise M.D.   On: 06/06/2013 20:26     Assessment and Plan: 1) R AC joint injury/inflammation:  - US guided AC joint injection   A steroid injection was performed under US guidance of the R AC joint using 1% plain Lidocaine and 40mg  of Depomedrol. Pt tolerated the procedure well and precautions were given.   2) R Rotator cuff tendonitis:  Pt already w/ NSAIDs May get some relief from local infiltration from steroid Rotator cuff exercises Return PRN

## 2013-06-11 NOTE — Patient Instructions (Signed)
Your right shoulder was injured when you fell from your bed. This was injected today in our clinic with numbing medicine and steroids which should take away the pain within a couple of days Please perform the exercises.  Please follow up as needed

## 2013-06-13 ENCOUNTER — Ambulatory Visit (INDEPENDENT_AMBULATORY_CARE_PROVIDER_SITE_OTHER): Payer: Medicaid Other | Admitting: Family Medicine

## 2013-06-13 ENCOUNTER — Ambulatory Visit (HOSPITAL_COMMUNITY)
Admission: RE | Admit: 2013-06-13 | Discharge: 2013-06-13 | Disposition: A | Discharge status: Home / Self Care | Payer: Medicaid Other | Source: Ambulatory Visit | Attending: Family Medicine | Admitting: Family Medicine

## 2013-06-13 ENCOUNTER — Encounter: Payer: Self-pay | Admitting: Family Medicine

## 2013-06-13 VITALS — BP 169/97 | HR 67 | Temp 97.9°F | Ht 67.0 in | Wt 257.0 lb

## 2013-06-13 DIAGNOSIS — M549 Dorsalgia, unspecified: Secondary | ICD-10-CM

## 2013-06-13 DIAGNOSIS — R079 Chest pain, unspecified: Secondary | ICD-10-CM | POA: Insufficient documentation

## 2013-06-13 NOTE — Progress Notes (Signed)
Patient ID: Norma [name], female   DOB: 20-Apr-1960, 53 y.o.   MRN: [id number]  HPI:  Pt presents for a same day appointment to discuss back/side pain and chest discomfort.  Side pain: has had pain in her sides for almost a year. Has done physical therapy in the past but states she can't do it now because of a "tear" in her shoulder and upcoming need for surgery in her knee. She has not had any abdominal pain, fevers, or dysuria. She notices the pain most when she tries to turn or move. Occasionally has some numbness down her left leg from her kne and lower back. No problems with PO intake, bowel or bladder. In past vicodin has helped her side/back pain but she was taken off this.   Chest discomfort: pt also c/o what she describes as "palpitations", however after further questioning this is more chest discomfort and not a sensation of irregular or fast heart beat. She states this started a few days ago and is getting worse. Whenever she stands up she feels pain in her chest that radiates up to her neck and down her back. She notices it as well when she is moving. It improves when she sits down. She describes the pain as "aching". It is located right in the middle of her chest. It is associated with SOB and sweating but no nausea.  Has cardiologist, Dr. Doylene Canard. Has not traveled. The pain does awaken her from sleep when she moves around. Not associated with food.  ROS: See HPI  [place]: Does not smoke cigarettes. Thinks grandparents had heart problems but doesn't know age.  PHYSICAL EXAM: BP 169/97  Pulse 67  Temp(Src) 97.9 F (36.6 C) (Oral)  Ht 5\' 7"  (1.702 m)  Wt 257 lb (116.574 kg)  BMI 40.24 kg/m2 Gen: NAD HEENT: NCAT Heart: RRR, no murmurs Lungs: CTAB, NWOB Abdomen: soft, nontender to palpation Neuro: grossly nonfocal, speech intact Ext: full strength in bilateral legs, negative straight leg raise bilaterally Chest: no pain with palpation of sternum Back: no pain with palpation of  posterior spinal musculature. Mild pain with palpation of lumbar area. When pt stands to do testing of ROM of back, she develops chest discomfort and has to sit down.  ASSESSMENT/PLAN:  # Chest discomfort: some components of hx concerning for angina, given that is exertional and resolves with rest, although unclear if it is actually positional. EKG done today in office and was normal. Recent myoview stress test in June 2014 without evidence of ischemia. I have called pt's cardiologist, Dr. Doylene Canard, and he is able to see her shortly in his office and recommends she come in to be evaluated today due to the bad weather forecast. Pt is amenable to going to his office, so I am sending her with a copy of her EKG for him to review. Suspect she may need additional ischemic evaluation or catheterization at some point. Precepted with Dr. Mingo Amber who agrees with this plan.   # Back/side pain: due to pressing nature of chest pain, did not fully evaluate or discuss this today. Advised that pt f/u with PCP to discuss more thoroughly. I did encourage her to consider steroid back injections at pain management, as she states this has been offered to her in the past.  FOLLOW UP: F/u with PCP for back/side pain F/u with cardiologist Dr. Doylene Canard within the next hour (pt going there directly)  [place] J. Ardelia Mems, [place]

## 2013-06-13 NOTE — Patient Instructions (Signed)
Please go directly to Dr. Merrilee Jansky office to be evaluated for the chest pain you've been having. Follow up with Dr. Dianah Field for the back pain.  Be well, Dr. Ardelia Mems

## 2013-06-18 ENCOUNTER — Emergency Department (HOSPITAL_COMMUNITY): Payer: Medicaid Other

## 2013-06-18 ENCOUNTER — Encounter (HOSPITAL_COMMUNITY): Payer: Self-pay | Admitting: Emergency Medicine

## 2013-06-18 ENCOUNTER — Emergency Department (HOSPITAL_COMMUNITY)
Admission: EM | Admit: 2013-06-18 | Discharge: 2013-06-18 | Disposition: A | Discharge status: Home / Self Care | Payer: Medicaid Other | Attending: Emergency Medicine | Admitting: Emergency Medicine

## 2013-06-18 ENCOUNTER — Other Ambulatory Visit: Payer: Self-pay | Admitting: Family Medicine

## 2013-06-18 ENCOUNTER — Telehealth: Payer: Self-pay | Admitting: Family Medicine

## 2013-06-18 DIAGNOSIS — R109 Unspecified abdominal pain: Secondary | ICD-10-CM

## 2013-06-18 DIAGNOSIS — E119 Type 2 diabetes mellitus without complications: Secondary | ICD-10-CM | POA: Insufficient documentation

## 2013-06-18 DIAGNOSIS — R1013 Epigastric pain: Secondary | ICD-10-CM

## 2013-06-18 DIAGNOSIS — Z87442 Personal history of urinary calculi: Secondary | ICD-10-CM | POA: Insufficient documentation

## 2013-06-18 DIAGNOSIS — Z79899 Other long term (current) drug therapy: Secondary | ICD-10-CM | POA: Insufficient documentation

## 2013-06-18 DIAGNOSIS — I10 Essential (primary) hypertension: Secondary | ICD-10-CM | POA: Insufficient documentation

## 2013-06-18 DIAGNOSIS — Z8739 Personal history of other diseases of the musculoskeletal system and connective tissue: Secondary | ICD-10-CM | POA: Insufficient documentation

## 2013-06-18 DIAGNOSIS — G8929 Other chronic pain: Secondary | ICD-10-CM | POA: Insufficient documentation

## 2013-06-18 DIAGNOSIS — K219 Gastro-esophageal reflux disease without esophagitis: Secondary | ICD-10-CM | POA: Insufficient documentation

## 2013-06-18 LAB — URINALYSIS, ROUTINE W REFLEX MICROSCOPIC
BILIRUBIN URINE: NEGATIVE
Glucose, UA: NEGATIVE mg/dL
HGB URINE DIPSTICK: NEGATIVE
Ketones, ur: NEGATIVE mg/dL
Nitrite: NEGATIVE
PH: 5 (ref 5.0–8.0)
Protein, ur: NEGATIVE mg/dL
Specific Gravity, Urine: 1.023 (ref 1.005–1.030)
Urobilinogen, UA: 0.2 mg/dL (ref 0.0–1.0)

## 2013-06-18 LAB — COMPREHENSIVE METABOLIC PANEL
ALK PHOS: 69 U/L (ref 39–117)
ALT: 25 U/L (ref 0–35)
AST: 36 U/L (ref 0–37)
Albumin: 4 g/dL (ref 3.5–5.2)
BILIRUBIN TOTAL: 0.6 mg/dL (ref 0.3–1.2)
BUN: 19 mg/dL (ref 6–23)
CHLORIDE: 96 meq/L (ref 96–112)
CO2: 26 meq/L (ref 19–32)
Calcium: 10.1 mg/dL (ref 8.4–10.5)
Creatinine, Ser: 0.86 mg/dL (ref 0.50–1.10)
GFR calc Af Amer: 87 mL/min — ABNORMAL LOW (ref >90)
GFR, EST NON AFRICAN AMERICAN: 75 mL/min — AB (ref >90)
Glucose, Bld: 131 mg/dL — ABNORMAL HIGH (ref 70–99)
Potassium: 3.8 mEq/L (ref 3.7–5.3)
SODIUM: 136 meq/L — AB (ref 137–147)
Total Protein: 8.9 g/dL — ABNORMAL HIGH (ref 6.0–8.3)

## 2013-06-18 LAB — CBG MONITORING, ED: Glucose-Capillary: 127 mg/dL — ABNORMAL HIGH (ref 70–99)

## 2013-06-18 LAB — CBC
HEMATOCRIT: 40.7 % (ref 36.0–46.0)
Hemoglobin: 13.4 g/dL (ref 12.0–15.0)
MCH: 28.3 pg (ref 26.0–34.0)
MCHC: 32.9 g/dL (ref 30.0–36.0)
MCV: 85.9 fL (ref 78.0–100.0)
Platelets: 257 10*3/uL (ref 150–400)
RBC: 4.74 MIL/uL (ref 3.87–5.11)
RDW: 15.3 % (ref 11.5–15.5)
WBC: 9 10*3/uL (ref 4.0–10.5)

## 2013-06-18 LAB — LIPASE, BLOOD: Lipase: 65 U/L — ABNORMAL HIGH (ref 11–59)

## 2013-06-18 LAB — URINE MICROSCOPIC-ADD ON

## 2013-06-18 LAB — CK: Total CK: 649 U/L — ABNORMAL HIGH (ref 7–177)

## 2013-06-18 MED ORDER — MORPHINE SULFATE 4 MG/ML IJ SOLN
6.0000 mg | Freq: Once | INTRAMUSCULAR | Status: AC
  Administered 2013-06-18: 6 mg via INTRAVENOUS
  Filled 2013-06-18: qty 2

## 2013-06-18 MED ORDER — TRAMADOL HCL 50 MG PO TABS
50.0000 mg | ORAL_TABLET | Freq: Four times a day (QID) | ORAL | Status: DC | PRN
Start: 2013-06-18 — End: 2013-08-17

## 2013-06-18 MED ORDER — FENTANYL CITRATE 0.05 MG/ML IJ SOLN
50.0000 ug | Freq: Once | INTRAMUSCULAR | Status: AC
  Administered 2013-06-18: 50 ug via INTRAVENOUS
  Filled 2013-06-18: qty 2

## 2013-06-18 NOTE — Telephone Encounter (Signed)
Is continuing to have cramps. Needs to find out what is happeinng pleasee advise

## 2013-06-18 NOTE — ED Notes (Signed)
Pt presents to ed with c/o chest pain and "cramping around my heart", x 2 weeks. Pt sts she was seen by her PCP "but they didn't help me". She also reports feeling lightheaded. Pt is very anxious, crying insisting "you have to do something"

## 2013-06-18 NOTE — Discharge Instructions (Signed)
If you were given medicines take as directed.  If you are on coumadin or contraceptives realize their levels and effectiveness is altered by many different medicines.  If you have any reaction (rash, tongues swelling, other) to the medicines stop taking and see a physician.   °Please follow up as directed and return to the ER or see a physician for new or worsening symptoms.  Thank you. ° ° °

## 2013-06-18 NOTE — ED Provider Notes (Signed)
CSN: [id number]     Arrival date & time 06/18/13  1502 History   First MD Initiated Contact with Patient 06/18/13 1525     Chief Complaint  Patient presents with  . Chest Pain     (Consider location/radiation/quality/duration/timing/severity/associated sxs/prior Treatment) HPI Comments: 53 yo female with chronic pain, HTN, Gerd, DM presents with upper abdominal and lower chest cramping, intermittent, occurs after eating, gradually worsening. Intermittent radiation to right and left flanks.  Kidney stone hx. No known CAD or MI hx.  No CP currently.  No diaphoresis.  No known GB hx or ulcers.  Pt on PPI.  Pt has seen pcp for this multiple times, no known diagnosis at this time.  No recent surgery, blood clot hx, active CA, leg pain/ swelling.   Patient is a 53 y.o. female presenting with chest pain. The history is provided by the patient.  Chest Pain Associated symptoms: no abdominal pain, no back pain, no fever, no headache, no shortness of breath and not vomiting     Past Medical History  Diagnosis Date  . Hypertension   . Acid reflux   . Tachyarrhythmia   . Left shoulder pain   . Back pain   . Trigger point of thoracic region 03/22/2012  . Irregular heart beat    Past Surgical History  Procedure Laterality Date  . Ankle surgery     Family History  Problem Relation Age of Onset  . Diabetes Brother   . Diabetes Maternal Aunt   . Diabetes Paternal Aunt   . Diabetes Maternal Grandmother   . Diabetes Maternal Grandfather   . Diabetes Paternal Grandmother   . Diabetes Paternal Grandfather    History  Substance Use Topics  . Smoking status: Never Smoker   . Smokeless tobacco: Never Used  . Alcohol Use: Yes     Comment: occasionally   OB History   Grav Para Term Preterm Abortions TAB SAB Ect Mult Living                 Review of Systems  Constitutional: Positive for appetite change. Negative for fever and chills.  HENT: Negative for congestion.   Eyes: Negative for  visual disturbance.  Respiratory: Negative for shortness of breath.   Cardiovascular: Positive for chest pain.  Gastrointestinal: Negative for vomiting and abdominal pain.  Genitourinary: Positive for flank pain. Negative for dysuria.  Musculoskeletal: Negative for back pain, neck pain and neck stiffness.  Skin: Negative for rash.  Neurological: Negative for light-headedness and headaches.      Allergies  Soma; Nortriptyline; and Prednisone  Home Medications   Current Outpatient Rx  Name  Route  Sig  Dispense  Refill  . acetaminophen (TYLENOL) 650 MG CR tablet   Oral   Take 1 tablet (650 mg total) by mouth every 8 (eight) hours.   30 tablet   3   . citalopram (CELEXA) 40 MG tablet   Oral   Take 1 tablet (40 mg total) by mouth daily. (mental health)   90 tablet   2   . dicyclomine (BENTYL) 20 MG tablet   Oral   Take 1 tablet (20 mg total) by mouth 2 (two) times daily.   20 tablet   0   . diltiazem (CARDIZEM SR) 60 MG 12 hr capsule   Oral   Take 1 capsule (60 mg total) by mouth 2 (two) times daily.   180 capsule   3   . EPINEPHrine (EPIPEN) 0.3 mg/0.3 mL SOAJ  Intramuscular   Inject 0.3 mLs (0.3 mg total) into the muscle once.   1 Device   1   . fish oil-omega-3 fatty acids 1000 MG capsule   Oral   Take 2 g by mouth daily.         Marland Kitchen gabapentin (NEURONTIN) 300 MG capsule   Oral   Take 1-2 capsules (300-600 mg total) by mouth 3 (three) times daily.   180 capsule   1   . hydrochlorothiazide (HYDRODIURIL) 25 MG tablet   Oral   Take 1 tablet (25 mg total) by mouth daily.   90 tablet   3   . hydrOXYzine (ATARAX/VISTARIL) 10 MG tablet      TAKE 1 TABLET BY MOUTH 3 TIMES DAILY AS NEEDED FOR ITCHING   30 tablet   2   . metFORMIN (GLUCOPHAGE) 500 MG tablet   Oral   Take 1 tablet (500 mg total) by mouth 2 (two) times daily with a meal.   180 tablet   0   . methocarbamol (ROBAXIN) 500 MG tablet   Oral   Take 1 tablet (500 mg total) by mouth every 8  (eight) hours as needed for muscle spasms.   30 tablet   0   . omeprazole (PRILOSEC) 20 MG capsule   Oral   Take 1 capsule (20 mg total) by mouth daily.   30 capsule   3   . polyethylene glycol (MIRALAX / GLYCOLAX) packet   Oral   Take 17 g by mouth daily as needed (CONSTIPATION).   14 each   1   . traZODone (DESYREL) 100 MG tablet   Oral   Take 2 tablets (200 mg total) by mouth at bedtime.   60 tablet   3    There were no vitals taken for this visit. Physical Exam  Nursing note and vitals reviewed. Constitutional: She is oriented to person, place, and time. She appears well-developed and well-nourished.  HENT:  Head: Normocephalic and atraumatic.  Eyes: Conjunctivae are normal. Right eye exhibits no discharge. Left eye exhibits no discharge.  Neck: Normal range of motion. Neck supple. No tracheal deviation present.  Cardiovascular: Normal rate and regular rhythm.   Pulmonary/Chest: Effort normal and breath sounds normal.  Abdominal: Soft. She exhibits no distension. There is tenderness (upper abdomen/ epig bilateral, mild). There is no guarding.  Musculoskeletal: She exhibits no edema and no tenderness.  Neurological: She is alert and oriented to person, place, and time.  Skin: Skin is warm. No rash noted.  Psychiatric: She has a normal mood and affect.    ED Course  Procedures (including critical care time)  EMERGENCY DEPARTMENT BILIARY ULTRASOUND INTERPRETATION "Study: Limited Abdominal Ultrasound of the gallbladder and common bile duct."  INDICATIONS: Abdominal pain and Nausea Indication: Multiple views of the gallbladder and common bile duct were obtained in real-time with a Multi-frequency probe." PERFORMED BY:  Myself IMAGES ARCHIVED?: Yes FINDINGS: Gallstones absent, Gallbladder wall normal in thickness and Sonographic Murphy's sign absent LIMITATIONS: Body Habitus and Bowel Gas INTERPRETATION: Normal   Emergency Focused Ultrasound Exam Limited  retroperitoneal ultrasound of kidneys  Performed and interpreted by Dr. Jodi Mourning Indication: flank pain Focused abdominal ultrasound with both kidneys imaged in transverse and longitudinal planes in real-time. Interpretation: Mild proximal ureter left hydronephrosis visualized.  No  stones or cysts visualized  Limited due to body habitus/ bowel gas Images archived electronically  Labs Review Labs Reviewed  COMPREHENSIVE METABOLIC PANEL - Abnormal; Notable for the following:  Sodium 136 (*)    Glucose, Bld 131 (*)    Total Protein 8.9 (*)    GFR calc non Af Amer 75 (*)    GFR calc Af Amer 87 (*)    All other components within normal limits  LIPASE, BLOOD - Abnormal; Notable for the following:    Lipase 65 (*)    All other components within normal limits  CK - Abnormal; Notable for the following:    Total CK 649 (*)    All other components within normal limits  CBG MONITORING, ED - Abnormal; Notable for the following:    Glucose-Capillary 127 (*)    All other components within normal limits  CBC  URINALYSIS, ROUTINE W REFLEX MICROSCOPIC   Imaging Review Ct Abdomen Pelvis Wo Contrast  06/18/2013   CLINICAL DATA:  Two week history of left upper quadrant discomfort  EXAM: CT ABDOMEN AND PELVIS WITHOUT CONTRAST  TECHNIQUE: Multidetector CT imaging of the abdomen and pelvis was performed following the standard protocol without intravenous contrast.  COMPARISON:  CT ABD/PELV WO CM dated 04/08/2013;  FINDINGS: The kidneys exhibit normal density and contour. There is no evidence of hydronephrosis nor of calcified stones. The perinephric fat is normal in density. The ureters are normal in course and caliber. There is a stable faint sub mm calcification near the right ureterovesical junction on image 81 which is nonspecific. A definite stone is not demonstrated here. The urinary bladder is partially distended and grossly normal.  The uterus and adnexal structures exhibit no acute abnormality. I  cannot exclude a cystic left adnexal process. There is no free fluid in the pelvis.  The liver exhibits no focal mass nor ductal dilation. The gallbladder, spleen, nondistended stomach, pancreas, adrenal glands, and periaortic and pericaval regions are normal in appearance. There are prominent veins within the peritoneal cavity in the left upper and mid abdomen predominantly inferior to the spleen. These are not new. There is no intra-abdominal or pelvic lymphadenopathy. The unopacified loops of small and large bowel exhibit no evidence of ileus nor of obstruction. The appendix is not discretely demonstrated. The rectosigmoid colon is normal in appearance. There is no inguinal hernia. There is a small umbilical hernia containing fat.  The lung bases exhibit minimal compressive atelectasis. The lumbar vertebral bodies are preserved in height. There is degenerative disc change at L3-4, L4-5, and at L5-S1. There are prominent posterior endplate osteophytes at L5-S1. The bony pelvis exhibits no acute abnormality.  IMPRESSION: 1. There is no evidence of urinary tract obstruction nor of urinary tract stones. No other urinary tract abnormality is demonstrated. 2. No acute abnormality of the stomach or spleen or other structures in the left upper quadrant of the abdomen is demonstrated. There are prominent vascular structures adjacent to the spleen especially inferiorly which may indicate underlying portal hypertension. By history the patient is a hepatitis-C carrier. 3. There is no evidence of acute bowel abnormality. 4. There is soft tissue fullness in the left adnexal region. Further evaluation with pelvic ultrasound may be useful especially if the patient is having pelvic symptoms.   Electronically Signed   By: David  Martinique   On: 06/18/2013 18:24   Dg Chest 2 View  06/18/2013   CLINICAL DATA:  Chest pain  EXAM: CHEST  2 VIEW  COMPARISON:  None.  FINDINGS: The heart size and mediastinal contours are within normal  limits. Both lungs are clear. The visualized skeletal structures are unremarkable.  IMPRESSION: No active cardiopulmonary disease.  Electronically Signed   By: Kathreen Devoid   On: 06/18/2013 16:37     EKG Interpretation   Date/Time:  Monday June 18 2013 15:15:02 EST Ventricular Rate:  73 PR Interval:  174 QRS Duration: 94 QT Interval:  423 QTC Calculation: 466 R Axis:   65 Text Interpretation:  Sinus rhythm Low voltage, precordial leads Abnormal  R-wave progression, early transition Baseline wander in lead(s) V1 V6  Confirmed by Esme Freund  MD, Lurene Robley ([id number]) on 06/18/2013 4:39:37 PM      MDM   Final diagnoses:  Epigastric pain  Abdominal cramping   Concern for gallstones/ vs kidney stones vs abdo cramping vs ulcer vs less likely cardiac/ other.  Well appearing, pain resolved on recheck.  Bedside US mild hydronephrosis left proximal ureter, difficult due to body habitus. Normal GB US done by myself, limited due to recent food ingestion. CT stone study ordered.  Pt improved in ED, no acute process at this time, fup outpt.  Results and differential diagnosis were discussed with the patient. Close follow up outpatient was discussed, patient comfortable with the plan.       Mariea Clonts, MD 06/18/13 Drema Halon

## 2013-06-18 NOTE — Telephone Encounter (Signed)
Note.  Will route note to PCP as FYI.  Burna Forts, BSN, RN-BC

## 2013-06-18 NOTE — Telephone Encounter (Signed)
Pt went to ED "she couldn't take it anymore. Maybe they can find out whats wrong because they sure dont know over there"

## 2013-06-18 NOTE — Telephone Encounter (Signed)
Returned call to patient.  Left message to call our office back.  Burna Forts, BSN, RN-BC

## 2013-06-19 ENCOUNTER — Telehealth: Payer: Self-pay | Admitting: Family Medicine

## 2013-06-19 NOTE — Telephone Encounter (Signed)
Norma [name] called back to say she doesn't want Dr. Darene Lamer involved in anything that concerns her care.  Is not happy with her thus far.  Want to go back to Dr. Adrian Blackwater.  She need someone that will help her.  Want to have a callback asap with something more concrete to add regarding her problem.

## 2013-06-19 NOTE — Telephone Encounter (Signed)
Patient called back and states she went to ED yesterday for cramps "all over body."  Had U/S yesterday and states it showed a mass.  Had fibroids last year and it may be causing the cramps.  Patient states the ED provider wanted her to be referred to OB/GYN to evaluate pain and possible mass.  Has seen OB/GYN in past, but does not remember his name.    Patient also requesting to "change to another doctor that knows more about my situation."  Feels like she "keeps getting pills, but still in pain."  Had previously requested PCP change in 02/2013 because "new practitioner not trying to help me feel better", but agreed to stay with Dr. Dianah Field.  Will route request to Drs. Gwenlyn Saran and [place] to review and call patient back.  Nolene Ebbs, RN

## 2013-06-19 NOTE — Telephone Encounter (Signed)
Pt called again. She doesn't want a "student involved. They dont know how to take care of me"  She does not want dr t involved. She needs the referral to ob-gyn NOW. She is hurting so bad that she cant go to [place]. When asked what was in [place], she said they get to the bottom of things there.

## 2013-06-20 ENCOUNTER — Telehealth: Payer: Self-pay | Admitting: *Deleted

## 2013-06-20 ENCOUNTER — Ambulatory Visit (INDEPENDENT_AMBULATORY_CARE_PROVIDER_SITE_OTHER): Payer: Medicaid Other | Admitting: Family Medicine

## 2013-06-20 ENCOUNTER — Telehealth: Payer: Self-pay | Admitting: Family Medicine

## 2013-06-20 ENCOUNTER — Encounter: Payer: Self-pay | Admitting: Family Medicine

## 2013-06-20 VITALS — BP 127/84 | HR 77 | Temp 98.5°F | Ht 67.0 in | Wt 277.0 lb

## 2013-06-20 DIAGNOSIS — M791 Myalgia, unspecified site: Secondary | ICD-10-CM

## 2013-06-20 DIAGNOSIS — IMO0001 Reserved for inherently not codable concepts without codable children: Secondary | ICD-10-CM

## 2013-06-20 DIAGNOSIS — R252 Cramp and spasm: Secondary | ICD-10-CM

## 2013-06-20 DIAGNOSIS — M549 Dorsalgia, unspecified: Secondary | ICD-10-CM

## 2013-06-20 DIAGNOSIS — R51 Headache: Secondary | ICD-10-CM

## 2013-06-20 LAB — CK: CK TOTAL: 631 U/L — AB (ref 7–177)

## 2013-06-20 LAB — TSH: TSH: 1.275 u[IU]/mL (ref 0.350–4.500)

## 2013-06-20 LAB — LACTATE DEHYDROGENASE: LDH: 201 U/L (ref 94–250)

## 2013-06-20 MED ORDER — METHOCARBAMOL 500 MG PO TABS: 1500.0000 mg | ORAL_TABLET | Freq: Four times a day (QID) | ORAL | Status: DC | PRN

## 2013-06-20 MED ORDER — CYCLOBENZAPRINE HCL 10 MG PO TABS: 10.0000 mg | ORAL_TABLET | Freq: Three times a day (TID) | ORAL | Status: DC | PRN

## 2013-06-20 MED ORDER — SUMATRIPTAN SUCCINATE 6 MG/0.5ML ~~LOC~~ SOLN
6.0000 mg | Freq: Once | SUBCUTANEOUS | Status: AC
  Administered 2013-06-20: 6 mg via SUBCUTANEOUS

## 2013-06-20 MED ORDER — SUMATRIPTAN SUCCINATE 50 MG PO TABS: 50.0000 mg | ORAL_TABLET | ORAL | Status: DC | PRN

## 2013-06-20 NOTE — Telephone Encounter (Signed)
Will forward to RN pool to see if they saw this PA.  Norma [name]

## 2013-06-20 NOTE — Telephone Encounter (Signed)
Spoke with patient regarding PCP change request.  Will consider changing patient to Dr. Skeet Simmer [facility] team) or Dr. Ardelia Mems.  Patient was recently seen by Dr. Ardelia Mems and is willing to have her as PCP.  Will discuss with Drs. Nicholaus Bloom and call patient back regarding PCP change and GYN referral.  Burna Forts, BSN, RN-BC

## 2013-06-20 NOTE — Patient Instructions (Signed)
It was nice to see you today.  I have increased your Robaxin and given you Flexeril to use for spasm. Do not use them both simultaneously.  Try one and see which one works better for you.  I am proceeding with additional work up today.  The labs are looking for underlying muscle injury.  One of our staff members or myself will call with the results of your lab studies.  Follow up in 1 week.    Be sure to stay well hydrated.

## 2013-06-20 NOTE — Telephone Encounter (Signed)
[facility] told patient she would need prior authorazation in order to have imitrex filled. Please advise

## 2013-06-20 NOTE — Telephone Encounter (Signed)
[facility] called today requesting a refill on omeprazole, I gave the verbal ok to refill with 6 refills.Busick, Kevin Fenton

## 2013-06-20 NOTE — Telephone Encounter (Signed)
Thank you team for working on this. Norma [name], I called you today but did not get you and was in the hospital most of the day (on inpatient rotation now). I will precept this with an attending tomorrow and if appropriate to them as well, will place referral. We are supposed to precept all referrals. I do think it is appropriate at this time due to both symptoms, [place] finding, and patient request. [name], MD

## 2013-06-20 NOTE — Telephone Encounter (Signed)
Prior Authorization received from [facility] for Sumatriptan SUCC 50 mg tab. Formulary and PA form placed in provider box for completion. Derl Barrow, RN

## 2013-06-21 DIAGNOSIS — R252 Cramp and spasm: Secondary | ICD-10-CM | POA: Insufficient documentation

## 2013-06-21 NOTE — Telephone Encounter (Signed)
I see it ordered 30 tabs with 3 refills.  Pt has h/o GERD. In the future, would try to decrease this but for now if she is needing it, that is okay. [name], MD

## 2013-06-21 NOTE — Assessment & Plan Note (Signed)
History concerning for migraine headache. Imitrex given SQ with improvement. Rx given for imitrex for future abortive treatment.

## 2013-06-21 NOTE — Telephone Encounter (Signed)
Filled out and placed on Tamika's desk. Please let me know if you know the preferred quantity, as I am also happy to write for that.  [name], MD

## 2013-06-21 NOTE — Assessment & Plan Note (Addendum)
Unclear etiology. Most recent ER visit revealed elevated CPK of 649.  Given this abnormality, there is concern for underlying polymyositis/dermatomyositis. Will obtain repeat CPK, LDH, Aldolase. Will also obtain TSH. Patient to follow up in 1 week.  Robaxin increased and Flexeril given as well (to be used if fails robaxin) for cramping/spasm.

## 2013-06-21 NOTE — Telephone Encounter (Signed)
Discussed with Dr Nori Riis - Patient with abdominal cramping and recent CT abdomen/pelvis showing soft tissue fullness in the left adnexal region - [place] warranted and patient requesting gyn referral. This is an appropriate request and referral placed. Will also need to keep GI and kidney stone in ddx but no stone seen on imaging in ED and [place] to eval gallbladder also WNL, though limited due to habitus and recent food ingestion. Cardiac less likely though patient has a cardiologist who she recently saw 2/25 after clinic visit with Dr Ardelia Mems [facility]). Awaiting these records.  [name], MD

## 2013-06-21 NOTE — Telephone Encounter (Signed)
Referral was processed and [facility] will contact pt.  Norma [name]

## 2013-06-21 NOTE — Progress Notes (Signed)
   Subjective:    Patient ID: Norma [name], female    DOB: May 31, 1960, 53 y.o.   MRN: [id number]  HPI 53 year old female with a hx of multiple MSK complaint and chronic pain syndrome presents for a same-day appointment.  1) Muscle cramps - Patient reports that she has had intermittent, severe muscle cramping and pain since December 2014. - Located on the sides of her abdomen, thighs, calves - Relieve by rest. Exacerbated by activity/movements. - They have continued to persist despite antispasmodics. - This recently got so severe that she had to go to the ED on 3/2. - Patient is dissatisfied that no one has aided her pain and found out the underlying cause.  2) Headache - Patient has had a headache for several days - Located frontally - Pain moderate to severe and described as dull ache - Associated with nausea, photophobia and phonophobia  Review of Systems Per HPI    Objective:   Physical Exam Exam: General: well appearing female, anxious, NAD.  HEENT: NCAT. PEERLA.  Abdomen: obese, soft, nontender, nondistended. No spasm noted. Neuro: CN 2-12 grossly intact. 5/5 muscle strength in all extremities.      Assessment & Plan:  See Problem List

## 2013-06-21 NOTE — Addendum Note (Signed)
Addended by: Conni Slipper T on: 06/21/2013 09:58 AM   Modules accepted: Orders

## 2013-06-22 ENCOUNTER — Telehealth: Payer: Self-pay | Admitting: Family Medicine

## 2013-06-22 LAB — ALDOLASE: Aldolase: 5.5 U/L (ref <=8.1)

## 2013-06-22 NOTE — Telephone Encounter (Signed)
Great thank you. Sounds like it was taken care of.  [name], MD

## 2013-06-22 NOTE — Telephone Encounter (Signed)
Quantity limit is 9/30 day.  [facility] already submitted claim for 9 tabs and paid.  No need to submit PA unless written for over 9 tabs.  Derl Barrow, RN

## 2013-06-22 NOTE — Telephone Encounter (Signed)
Called patient and stated I am sorry that our physician-patient relationship did not work out but that Norma [name] was currently working on pairing her with another resident physician and that I hoped that relationship will work out. I let her know Norma [name] should be calling her soon. She voiced understanding and appreciation of the call.  [name], MD 06/22/2013 12:11 PM

## 2013-06-26 ENCOUNTER — Encounter: Payer: Self-pay | Admitting: *Deleted

## 2013-06-26 NOTE — Telephone Encounter (Signed)
Message copied by Corinna Capra on Tue Jun 26, 2013  8:45 AM ------      Message from: Norma [name]      Created: Mon Jun 25, 2013  9:26 PM       Please call and inform patient that her CK level (indicating muscle injury) was elevated on repeat testing.  The remainder of her labs were normal.  It is unclear why she is having "cramps" and pain.  This needs to be further discussed with her PCP.            Jayce ------

## 2013-06-26 NOTE — Patient Instructions (Signed)
DR Dorna Leitz [facility] T[facility] [address] AT [place] [phone]

## 2013-06-26 NOTE — Telephone Encounter (Signed)
Left message for a return call.Norma [name], Norma [name]

## 2013-07-18 ENCOUNTER — Ambulatory Visit (INDEPENDENT_AMBULATORY_CARE_PROVIDER_SITE_OTHER): Payer: Medicaid Other | Admitting: Family Medicine

## 2013-07-18 ENCOUNTER — Encounter: Payer: Self-pay | Admitting: Family Medicine

## 2013-07-18 VITALS — BP 132/82 | HR 66 | Ht 67.0 in | Wt 248.0 lb

## 2013-07-18 DIAGNOSIS — M25551 Pain in right hip: Secondary | ICD-10-CM

## 2013-07-18 DIAGNOSIS — G4710 Hypersomnia, unspecified: Secondary | ICD-10-CM

## 2013-07-18 DIAGNOSIS — M25559 Pain in unspecified hip: Secondary | ICD-10-CM

## 2013-07-18 DIAGNOSIS — G4719 Other hypersomnia: Secondary | ICD-10-CM

## 2013-07-18 DIAGNOSIS — M25552 Pain in left hip: Secondary | ICD-10-CM

## 2013-07-18 MED ORDER — KETOROLAC TROMETHAMINE 60 MG/2ML IM SOLN
60.0000 mg | Freq: Once | INTRAMUSCULAR | Status: AC
  Administered 2013-07-18: 60 mg via INTRAMUSCULAR

## 2013-07-18 NOTE — Assessment & Plan Note (Signed)
Now with some involvement on the right side. Possible trochanteric bursitis but given patient's habitus would prefer for her to be evaluated for possible injection by sports medicine with Dr. Nori Riis who she knows well. - Toradol 60mg  IM given today

## 2013-07-18 NOTE — Progress Notes (Signed)
Patient ID: Norma [name]    DOB: 05-05-1960, 53 y.o.   MRN: [id number] --- Subjective:  Norma [name] is a 53 y.o.female who presents with bilateral lateral hip pain.  Left side more than right. She used to have pain only in the left and now is having it more on the right as well. She has had chronic left hip pain for several months. Pain occurs after 3-5 minutes of walking. Worse with going up and down steps. Wakes her up at night. Pain is a sharp pain intermittent but daily. Worse with walking better with rest. Pain is located on the lateral aspect of the hip. She denies any significant lower back pain. No weakness in lower extremities. No numbness. She thinks she has had a hip injection in the past but she is not sure whether it helped or not. She is scheduled for left knee replacements next month.  ROS: see HPI Past Medical History: reviewed and updated medications and allergies. Social History: Tobacco: none  Objective: Filed Vitals:   07/18/13 1505  BP: 132/82  Pulse: 66    Physical Examination:   General appearance - alert, well appearing, and in no distress   hip: tenderness along lateral aspect of thigh, near greater trochanter, no reproducible tenderness on the right, normal hip flexion. No pain with hip external or internal rotation bilaterally. Stiffness and discomfort with FABER bilaterally Normal strength with hip flexion  knee extension, knee flexion, plantar flexion and dorsi flexion bilaterally

## 2013-07-18 NOTE — Patient Instructions (Signed)
Please follow up with Dr. Nori Riis about the possibility of a shot in your hip.   For now, we'll try a toradol shot and see if that helps. Ice the area as well.

## 2013-07-19 ENCOUNTER — Telehealth: Payer: Self-pay | Admitting: Family Medicine

## 2013-07-19 NOTE — Telephone Encounter (Signed)
Refill request for Kenalog.  

## 2013-07-21 MED ORDER — TRIAMCINOLONE ACETONIDE 0.1 % EX CREA: 1.0000 "application " | TOPICAL_CREAM | Freq: Two times a day (BID) | CUTANEOUS | Status: DC

## 2013-07-25 DIAGNOSIS — L600 Ingrowing nail: Secondary | ICD-10-CM

## 2013-07-25 DIAGNOSIS — L03039 Cellulitis of unspecified toe: Secondary | ICD-10-CM

## 2013-08-13 ENCOUNTER — Ambulatory Visit: Payer: Medicaid Other | Admitting: Physical Therapy

## 2013-08-14 ENCOUNTER — Ambulatory Visit: Payer: Medicaid Other | Admitting: Physical Therapy

## 2013-08-14 ENCOUNTER — Ambulatory Visit: Payer: Medicaid Other | Attending: Orthopedic Surgery | Admitting: Physical Therapy

## 2013-08-14 DIAGNOSIS — M25569 Pain in unspecified knee: Secondary | ICD-10-CM | POA: Insufficient documentation

## 2013-08-14 DIAGNOSIS — R262 Difficulty in walking, not elsewhere classified: Secondary | ICD-10-CM | POA: Diagnosis not present

## 2013-08-14 DIAGNOSIS — IMO0001 Reserved for inherently not codable concepts without codable children: Secondary | ICD-10-CM | POA: Insufficient documentation

## 2013-08-14 DIAGNOSIS — R609 Edema, unspecified: Secondary | ICD-10-CM | POA: Diagnosis not present

## 2013-08-16 ENCOUNTER — Encounter: Payer: Medicaid Other | Admitting: Family Medicine

## 2013-08-17 ENCOUNTER — Ambulatory Visit (INDEPENDENT_AMBULATORY_CARE_PROVIDER_SITE_OTHER): Payer: Medicaid Other | Admitting: Family Medicine

## 2013-08-17 ENCOUNTER — Encounter: Payer: Self-pay | Admitting: Family Medicine

## 2013-08-17 VITALS — BP 133/91 | HR 91 | Ht 67.0 in | Wt 248.0 lb

## 2013-08-17 DIAGNOSIS — M25569 Pain in unspecified knee: Secondary | ICD-10-CM

## 2013-08-17 DIAGNOSIS — S43429A Sprain of unspecified rotator cuff capsule, initial encounter: Secondary | ICD-10-CM

## 2013-08-17 DIAGNOSIS — M25562 Pain in left knee: Secondary | ICD-10-CM

## 2013-08-17 DIAGNOSIS — M75102 Unspecified rotator cuff tear or rupture of left shoulder, not specified as traumatic: Secondary | ICD-10-CM

## 2013-08-17 NOTE — Progress Notes (Signed)
   Subjective:    Patient ID: Norma [name], female    DOB: 1960-09-09, 53 y.o.   MRN: [id number]  HPI Several issues. #1 she is status post left knee surgery doing extremely well. She still having some fluid accumulation but overall is very happy with her knee replacement. #2. Bilateral but left worst than right shoulder pain. We have previously evaluated her with MRI in the left shoulder has a full thickness retracted rotator cuff tear. She's not benefit much from conservative therapy. She was her presenting additional she can do.   Review of Systems No fever, sweats, chills, unusual weight change. No extremity numbness or tingling to    Objective:   Physical Exam  vital signs are reviewed GENERAL: Well-developed female no acute distress to KNEE: Left. Small effusion. She has almost full extension and she does have full flexion. The calf is soft. The incision site is healing well.  Shoulders: Bilaterally symmetrical. She has weakness with abduction on the left past 110. Pain with internal rotation, lift off test and supraspinatus testing. The right shoulder has pain with abduction but strength is 5 out of 5 in all planes the rotator cuff. Distally she is neurovascularly intact in bilateral upper extremity.  IMAGING:: I reviewed her previous MRI left shoulder which showed a full thickness rotator cuff tear that was significantly retracted.  I cannot find images from her shoulder ultrasound. She reminds me that we had seen a partial rotator cuff tear.     Assessment & Plan:  #1. Left knee pain doing well status post surgical intervention. She will follow up with orthopedist #2. Continued and worsening left shoulder pain with known full thickness retracted rotator cuff tear of the supraspinatus. Also with some right shoulder pain. We've not done MRI on that. Says she's sitting orthopedist, I would just have her take the MRI him up them look at. I don't think she's a candidate for any type  of repair on the left shoulder but perhaps there is some other intervention they can do. Should she need a separate for that particular joint, she'll let me know. We'll be happy see her back at any time.

## 2013-08-17 NOTE — Patient Instructions (Signed)
  Please ask your orthopedist if he needs a separate referral for youe shoulder issues. Your previous MRI showed thi on the left  Clinical Data: Left shoulder pain.  MRI OF THE LEFT SHOULDER WITHOUT CONTRAST  Technique: Multiplanar, multisequence MR imaging of the left  shoulder was performed. No intravenous contrast was administered.  Comparison: None  FINDINGS:  Rotator cuff: Significant rotator cuff tendinopathy/tendinosis  mainly involving the supraspinatus and infraspinatus tendons.  Small interstitial tears involving the infraspinatus tendon and a  full-thickness retracted tear involving the anterior attachment  region of the supraspinatus tendon. The tear is approximately 18  mm wide and there is a maximum of 12 mm of retraction. The  subscapularis tendon is intact.  Muscles: No muscle tear, myositis or fatty atrophy.  Biceps long head: Intact.  Acromioclavicular Joint: Mild AC joint degenerative changes. The  acromion is relatively flat or type 1. No significant lateral  downsloping or undersurface spurring change.  Glenohumeral Joint: Normal.  Labrum: Normal.  Bones: Intact.  IMPRESSION:  1. Full-thickness retracted supraspinatus tendon tear as discussed  above.  2. Infraspinatus tendinopathy and interstitial tears.  3. No findings for bony impingement.  4. Intact biceps tendon and glenoid labra.   We had done an US of the shoulder on the right that showed a probable partial tear but we have not done MRI. She also had fairly extensive AC joint arthropathy

## 2013-08-20 ENCOUNTER — Ambulatory Visit (INDEPENDENT_AMBULATORY_CARE_PROVIDER_SITE_OTHER): Payer: Medicaid Other | Admitting: Family Medicine

## 2013-08-20 ENCOUNTER — Encounter: Payer: Self-pay | Admitting: Family Medicine

## 2013-08-20 VITALS — BP 143/67 | HR 101 | Temp 98.9°F | Ht 67.0 in | Wt 248.0 lb

## 2013-08-20 DIAGNOSIS — G894 Chronic pain syndrome: Secondary | ICD-10-CM

## 2013-08-20 MED ORDER — CYCLOBENZAPRINE HCL 10 MG PO TABS: 10.0000 mg | ORAL_TABLET | Freq: Three times a day (TID) | ORAL | Status: DC | PRN

## 2013-08-20 MED ORDER — GABAPENTIN 300 MG PO CAPS: 300.0000 mg | ORAL_CAPSULE | Freq: Three times a day (TID) | ORAL | Status: DC

## 2013-08-20 NOTE — Assessment & Plan Note (Addendum)
A: currently on narcotics for recent surgical intervention Reports previous relief with flexeril Wonders if she has arthritis Also appears to have depression which is very limiting to her P: changes to meds to be made slowly to piece out risk/benefit of each (pt amenable with this plan) Increase gabapentin to 300TID Flexeril 10 TID prn (instructed not to drive with this med Possible consideration of lyrica or cymbalta Patient has previous documented allergy to TCA (would avoid for now unless is last resort, we are certainly not at that point yet) Pt to f/up closely for further adjustment

## 2013-08-20 NOTE — Patient Instructions (Signed)
Norma [name], it was great to see you today! Wonderful to meet you  I am pleased to hear that things are going well for you after your surgery.  For your pain lets increase the gabapentin to 3 times a day.  We will also add flexeril for muscle spasms. Please be careful when driving. Best to take a night I would like to see you back in about 1 months time for further adjustment on your meds. Please call if you need me sooner  Looking forward to seeing you soon [name], MD

## 2013-08-20 NOTE — Progress Notes (Signed)
Patient ID: TIMBER MARSHMAN, female   DOB: 1960-08-15, 53 y.o.   MRN: [id number] [facility] [name], MD Phone: [phone]  Subjective:  Ms Blystone is a 53 y.o F, who is a new patient to me here to f/up on chronic pain issues   # left knee pain/shoulder pain -last saw Dr. Nori Riis on 5/1 for f/up -doing well post surgical intervention for knee, she is going to see an orthopedist soon for further management  -will also plan on view MRI images for further evaluation of right shoulder pain, does have a known full thickness rotator cuff tear of supraspinatus  #chronic joint pain and back pain -has been on going for the last 6 months -has hx of depression, not currently well controlled on celexa 40mg , was supposed to see [facility] soon for further adjustment, apparently started on seroquel but this made her act very unusually -has been taking gabapentin 300BID with some relief, has been out of flexeril for quite some time  All systems were reviewed and were negative unless otherwise noted in the HPI  Past Medical History Patient Active Problem List   Diagnosis Date Noted  . Muscle cramping 06/21/2013  . Back pain 06/06/2013  . Dizziness 05/08/2013  . Right shoulder pain 04/18/2013  . Burning pain 03/01/2013  . Left knee pain 01/10/2013  . Sciatica of left side 01/01/2013  . Chronic dental pain 10/13/2012  . Rash and nonspecific skin eruption 10/07/2012  . Toenail avulsion 08/30/2012  . Trigger point with back pain 08/30/2012  . Hyperglycemia 08/15/2012  . Low back pain radiating to left leg 07/17/2012  . Somatic dysfunction - Osteopathic Findings 07/02/2012  . Right foot pain 06/30/2012  . Left leg pain 06/06/2012  . Headache(784.0) 05/22/2012  . Paroxysmal dyspnea 05/11/2012  . Rotator cuff tear, left 05/05/2012  . Chest pain 04/26/2012  . Acne 03/21/2012  . Abdominal cramps 03/09/2012  . Left shoulder pain   . Left hand pain 01/21/2012  . Decreased  hearing 12/29/2011  . Costochondritis 11/02/2011  . Eczema, dyshidrotic 06/02/2010  . Pain in right ankle 05/06/2010  . FIBROIDS, UTERUS 10/24/2009  . GERD 06/09/2009  . Chronic pain syndrome 10/04/2008  . HIP PAIN, LEFT, CHRONIC 08/16/2007  . Hepatitis C carrier 03/21/2007  . OBESITY, NOS 06/16/2006  . DEPRESSION, MAJOR, RECURRENT 06/16/2006  . HYPERTENSION, BENIGN SYSTEMIC 06/16/2006   Reviewed problem list.  Medications- reviewed and updated Chief complaint-noted No additions to family history Social history- patient is a never smoker  Objective: BP 143/67  Pulse 101  Temp(Src) 98.9 F (37.2 C) (Oral)  Ht 5\' 7"  (1.702 m)  Wt 248 lb (112.492 kg)  BMI 38.83 kg/m2 Gen: NAD, alert, cooperative with exam HEENT: NCAT, EOMI, PERRL Neck: FROM, supple Ext: No edema, warm, normal tone, moves UE/LE spontaneously but with limited ROM with abduction and forward flexion of bilateral upper extremities. Mid thoracic back discomfort on palpation accompanied by paraspinal tenderness; all 10 MCPs tender to palpation; swanneck deformity of middle finger  Neuro: Alert and oriented, No gross deficits Skin: no rashes no lesions  Assessment/Plan: See problem based a/p

## 2013-08-24 ENCOUNTER — Ambulatory Visit: Payer: Medicaid Other | Admitting: Family Medicine

## 2013-09-03 ENCOUNTER — Ambulatory Visit: Payer: Medicaid Other

## 2013-09-04 ENCOUNTER — Ambulatory Visit (INDEPENDENT_AMBULATORY_CARE_PROVIDER_SITE_OTHER): Payer: Medicaid Other | Admitting: Family Medicine

## 2013-09-04 ENCOUNTER — Encounter: Payer: Self-pay | Admitting: Family Medicine

## 2013-09-04 DIAGNOSIS — F339 Major depressive disorder, recurrent, unspecified: Secondary | ICD-10-CM

## 2013-09-04 DIAGNOSIS — G894 Chronic pain syndrome: Secondary | ICD-10-CM

## 2013-09-04 DIAGNOSIS — E669 Obesity, unspecified: Secondary | ICD-10-CM

## 2013-09-04 MED ORDER — GABAPENTIN 300 MG PO CAPS: 600.0000 mg | ORAL_CAPSULE | Freq: Three times a day (TID) | ORAL | Status: DC

## 2013-09-04 MED ORDER — CITALOPRAM HYDROBROMIDE 40 MG PO TABS: 40.0000 mg | ORAL_TABLET | Freq: Every day | ORAL | Status: DC

## 2013-09-04 NOTE — Assessment & Plan Note (Addendum)
A: somewhat improved from last visit conts to have depressive sx, clearly needs further titration on her medications for this (but does not wish to change at this time as she is going to f/up with psych soon) P: increase gabapentin by extra pill a day up to 2pills TID Cont flexeril as needed Refilled celexa 40mg  Pt to see [facility] Will call us for apppt after she does so Questionable elevated CPK ** follow up at next visit for concern for polymyositis

## 2013-09-04 NOTE — Progress Notes (Signed)
Patient ID: Norma [name], female   DOB: 12/03/1960, 53 y.o.   MRN: [id number] [facility] [name], MD Phone: [phone]  Subjective:  Norma [name] is a 53 y.o F who is known to me here to f/up on chronic issues   # chronic pain -described as all over her body but on further discussions appears most concerning is numbness and tingling in bilat feet and legs -has upcoming apt with physical therapy -plans to f/up with ortho for shoulder issues -has had some benefit from the flexeril  -still yet to feel improvement from gabapentin  #depression -feels very down lately -states that celexa not of much benefit but doesn't wish to change at thsi time bc she has upcoming appt with [facility]   #weight management -states that she has been drinking a lot of water and trying to eat the right foods but is not able to lose much weight -wants rx for weight loss -prev tried something that made stools smell horrible (suspect this was a fat blocking agent)  All systems were reviewed and were negative unless otherwise noted in the HPI  Past Medical History Patient Active Problem List   Diagnosis Date Noted  . Muscle cramping 06/21/2013  . Back pain 06/06/2013  . Dizziness 05/08/2013  . Right shoulder pain 04/18/2013  . Burning pain 03/01/2013  . Left knee pain 01/10/2013  . Sciatica of left side 01/01/2013  . Chronic dental pain 10/13/2012  . Rash and nonspecific skin eruption 10/07/2012  . Toenail avulsion 08/30/2012  . Trigger point with back pain 08/30/2012  . Hyperglycemia 08/15/2012  . Low back pain radiating to left leg 07/17/2012  . Somatic dysfunction - Osteopathic Findings 07/02/2012  . Right foot pain 06/30/2012  . Left leg pain 06/06/2012  . Headache(784.0) 05/22/2012  . Paroxysmal dyspnea 05/11/2012  . Rotator cuff tear, left 05/05/2012  . Chest pain 04/26/2012  . Acne 03/21/2012  . Abdominal cramps 03/09/2012  . Left shoulder pain   . Left hand pain  01/21/2012  . Decreased hearing 12/29/2011  . Costochondritis 11/02/2011  . Eczema, dyshidrotic 06/02/2010  . Pain in right ankle 05/06/2010  . FIBROIDS, UTERUS 10/24/2009  . GERD 06/09/2009  . Chronic pain syndrome 10/04/2008  . HIP PAIN, LEFT, CHRONIC 08/16/2007  . Hepatitis C carrier 03/21/2007  . OBESITY, NOS 06/16/2006  . DEPRESSION, MAJOR, RECURRENT 06/16/2006  . HYPERTENSION, BENIGN SYSTEMIC 06/16/2006   Reviewed problem list.  Medications- reviewed and updated Chief complaint-noted No additions to family history Social history- patient is a non smoker  Objective: BP 126/83  Pulse 102  Temp(Src) 98.3 F (36.8 C) (Oral)  Ht 5\' 7"  (1.702 m)  Wt 250 lb 6.4 oz (113.581 kg)  BMI 39.21 kg/m2 Gen: NAD, alert, cooperative with exam HEENT: NCAT, EOMI Neck: FROM, supple Ext: No edema, warm, normal tone, moves UE/LE spontaneously Neuro: Alert and oriented, No gross deficits Skin: no rashes no lesions  Assessment/Plan: See problem based a/p

## 2013-09-04 NOTE — Assessment & Plan Note (Signed)
A: seems to be making the right choices but no benefit Suspect hx may not be as accurate as reports May have difficulty with portion control P: rx given to f/up with Dr. Jenne Campus  Not comfortable with prescribing weight loss medications

## 2013-09-04 NOTE — Patient Instructions (Signed)
[name] it was great to see you today!  I am sorry to hear that things are not going well for you. Please go ahead and continue with the flexeril three times a day as needed  You may increase the gabapentin 300mg  by one extra pill at night time and then one in the afternoon and then another at breakfast time. Please call if you have any questions or concerns with these changes  I also think you would benefit from seeing the dietician for some weight loss strategies Please sch an apptmt with me after you see [facility] to help manage your mood medications and make sure we are on the right track.   Looking forward to seeing you soon Norma Bell, MD

## 2013-09-04 NOTE — Assessment & Plan Note (Signed)
A; worsened currently On celexa refilled rx P: will f/up with [facility]  Consider trial of alternative med (esp in light of pain issues)

## 2013-09-11 ENCOUNTER — Ambulatory Visit: Payer: Medicaid Other | Attending: Orthopedic Surgery

## 2013-09-11 ENCOUNTER — Ambulatory Visit: Payer: Medicaid Other | Admitting: Physical Therapy

## 2013-09-11 DIAGNOSIS — Z5189 Encounter for other specified aftercare: Secondary | ICD-10-CM | POA: Insufficient documentation

## 2013-09-11 DIAGNOSIS — R262 Difficulty in walking, not elsewhere classified: Secondary | ICD-10-CM | POA: Insufficient documentation

## 2013-09-11 DIAGNOSIS — M25569 Pain in unspecified knee: Secondary | ICD-10-CM | POA: Insufficient documentation

## 2013-09-11 DIAGNOSIS — R609 Edema, unspecified: Secondary | ICD-10-CM | POA: Insufficient documentation

## 2013-09-20 ENCOUNTER — Ambulatory Visit: Payer: Medicaid Other | Admitting: Physical Therapy

## 2013-09-25 ENCOUNTER — Telehealth: Payer: Self-pay | Admitting: Family Medicine

## 2013-09-25 NOTE — Telephone Encounter (Signed)
Has appt with Sport [facility] on [employer] [employer] on [address] 09-27-13 at 10:45. Would like a referral Phone number is [phone]

## 2013-09-25 NOTE — Telephone Encounter (Signed)
Will forward to MD to place referral to [place] and sports medicine office.  Pt is going as a recommended follow up per Dr. Nori Riis whom she saw at [facility].  Dorrien Grunder,CMA

## 2013-09-26 NOTE — Telephone Encounter (Signed)
If the recommendation is per Dr. Nori Riis then she is the one that needs to be placing the referral. [facility], MD

## 2013-09-27 NOTE — Telephone Encounter (Signed)
Will forward to Dr. Nori Riis to see if she can place this referral for patient. Raja Caputi,CMA

## 2013-09-28 ENCOUNTER — Ambulatory Visit: Payer: Medicaid Other | Attending: Orthopedic Surgery

## 2013-09-28 DIAGNOSIS — R609 Edema, unspecified: Secondary | ICD-10-CM | POA: Insufficient documentation

## 2013-09-28 DIAGNOSIS — R262 Difficulty in walking, not elsewhere classified: Secondary | ICD-10-CM | POA: Diagnosis not present

## 2013-09-28 DIAGNOSIS — M25569 Pain in unspecified knee: Secondary | ICD-10-CM | POA: Diagnosis not present

## 2013-09-28 DIAGNOSIS — Z5189 Encounter for other specified aftercare: Secondary | ICD-10-CM | POA: Diagnosis present

## 2013-09-28 NOTE — Telephone Encounter (Signed)
[place] I saw her at [facility] and I think we sent her to [place]--but I THOUGHt she had already been seen there---look in my basket for teh note--i think itis on top---so I am not sure why she would need the REFERRAl. See if you can sort this out [facility]! Dorcas Mcmurray

## 2013-10-01 ENCOUNTER — Encounter: Payer: Self-pay | Admitting: Family Medicine

## 2013-10-01 ENCOUNTER — Ambulatory Visit (INDEPENDENT_AMBULATORY_CARE_PROVIDER_SITE_OTHER): Payer: Medicaid Other | Admitting: Family Medicine

## 2013-10-01 VITALS — BP 116/79 | HR 92 | Temp 98.2°F | Wt 251.0 lb

## 2013-10-01 DIAGNOSIS — G894 Chronic pain syndrome: Secondary | ICD-10-CM

## 2013-10-01 NOTE — Patient Instructions (Signed)
Ms Chim it was great to see you today!  I am sorry to hear that you are experiencing discomfort Try the ice cups (freeze water 3/4 full in disposable cup) or use ice packs to massage You can also try foam roller or a ball roller to help with discomfort Start taking mobic for 2 weeks to calm down inflammation  Go back to [facility] and discuss seeing a different provider there Follow up with Sports med, they know you are here today and will be following you closely  Looking forward to seeing you soon [name], MD

## 2013-10-01 NOTE — Assessment & Plan Note (Signed)
A: Chronic now with involvement of left hip joint; suspect a component of bursitis 2/2 antalgic gait; point tender on IT insertion point; depression sx significantly affect pain (and currently not well controlled on wellbutrin, does not like relationship see has with new provider at [facility]) P: rec ice cups Cont weight loss Foam or ball rollers Cont percocet 2 week course of mobic Flexeril prn Question whether need to f/up with polymyositis F/up closely with [facility] Return to sports med for possible injection of bursa vs imaging

## 2013-10-01 NOTE — Progress Notes (Signed)
Patient ID: Norma [name], female   DOB: 1960/07/16, 53 y.o.   MRN: [id number]   [facility] [name], MD Phone: [phone]  Subjective:  Norma [name] is a 53 y.o F who is well known to me who presents with hip discomfort  # Hip/joint pain -chronic but worsening approx a few weeks ago (particularly the Left>Right), has had to walk with cane to assist with painful ambulation -does occasionally experience numbness and shooting pain down the back of her leg on left side -no clicking, popping or locking sx, has never given out on her -currently taking percocet (from sports med) as well as flexeril prn for spasms (has not taken as much recently) -tried to take prednisone for 1 day but increased facial acne so pt had to stop  All systems were reviewed and were negative unless otherwise noted in the HPI  Past Medical History Patient Active Problem List   Diagnosis Date Noted  . Muscle cramping 06/21/2013  . Back pain 06/06/2013  . Dizziness 05/08/2013  . Right shoulder pain 04/18/2013  . Burning pain 03/01/2013  . Left knee pain 01/10/2013  . Sciatica of left side 01/01/2013  . Chronic dental pain 10/13/2012  . Rash and nonspecific skin eruption 10/07/2012  . Toenail avulsion 08/30/2012  . Trigger point with back pain 08/30/2012  . Hyperglycemia 08/15/2012  . Low back pain radiating to left leg 07/17/2012  . Somatic dysfunction - Osteopathic Findings 07/02/2012  . Right foot pain 06/30/2012  . Left leg pain 06/06/2012  . Headache(784.0) 05/22/2012  . Paroxysmal dyspnea 05/11/2012  . Rotator cuff tear, left 05/05/2012  . Chest pain 04/26/2012  . Acne 03/21/2012  . Abdominal cramps 03/09/2012  . Left shoulder pain   . Left hand pain 01/21/2012  . Decreased hearing 12/29/2011  . Costochondritis 11/02/2011  . Eczema, dyshidrotic 06/02/2010  . Pain in right ankle 05/06/2010  . FIBROIDS, UTERUS 10/24/2009  . GERD 06/09/2009  . Chronic pain syndrome  10/04/2008  . HIP PAIN, LEFT, CHRONIC 08/16/2007  . Hepatitis C carrier 03/21/2007  . OBESITY, NOS 06/16/2006  . DEPRESSION, MAJOR, RECURRENT 06/16/2006  . HYPERTENSION, BENIGN SYSTEMIC 06/16/2006   Reviewed problem list.  Medications- reviewed and updated Chief complaint-noted No additions to family history Social history- patient is a never smoker  Objective: BP 116/79  Pulse 92  Temp(Src) 98.2 F (36.8 C) (Oral)  Wt 251 lb (113.853 kg) Gen: NAD, alert, cooperative with exam Neck: FROM, supple Ext: Tender at IT insertion point on left hip, full flexion at bilateral knees; incision site present, hip flexion limited to approx 70degrees bilaterally, internal rotation at hip with significant pain  Neuro: Alert and oriented, No gross deficits Skin: no rashes no lesions, prior surgical scars   Assessment/Plan: See problem based a/p

## 2013-10-08 ENCOUNTER — Ambulatory Visit: Payer: Medicaid Other | Admitting: Physical Therapy

## 2013-10-08 DIAGNOSIS — Z5189 Encounter for other specified aftercare: Secondary | ICD-10-CM | POA: Diagnosis not present

## 2013-11-08 ENCOUNTER — Ambulatory Visit (INDEPENDENT_AMBULATORY_CARE_PROVIDER_SITE_OTHER): Payer: Medicaid Other | Admitting: Family Medicine

## 2013-11-08 ENCOUNTER — Encounter: Payer: Self-pay | Admitting: Family Medicine

## 2013-11-08 VITALS — BP 150/97 | HR 70 | Ht 67.0 in | Wt 253.0 lb

## 2013-11-08 DIAGNOSIS — M25529 Pain in unspecified elbow: Secondary | ICD-10-CM

## 2013-11-08 DIAGNOSIS — G894 Chronic pain syndrome: Secondary | ICD-10-CM

## 2013-11-08 LAB — C-REACTIVE PROTEIN

## 2013-11-08 MED ORDER — DICLOFENAC SODIUM 75 MG PO TBEC: 75.0000 mg | DELAYED_RELEASE_TABLET | Freq: Two times a day (BID) | ORAL | Status: DC

## 2013-11-08 NOTE — Patient Instructions (Signed)
Thank you for coming in,   I will call you with the results from today.   Try the Voltaren for a couple of weeks and see if there is any improvement. Please follow up if no improvement.    Please feel free to call with any questions or concerns at any time, at [phone]. --Dr. Raeford Razor

## 2013-11-08 NOTE — Progress Notes (Signed)
Subjective:    Patient ID: Norma [name], female    DOB: 1960/11/02, 53 y.o.   MRN: [id number]  HPI  Norma [name] is here for b/l forearm pain.  Patient has been seen several times for different aspects of pain, generating from different areas of the body.   She has an upcoming shoulder surgery on her left shoulder. The pain in her b/l forearms is new. This started about 1.5 weeks ago. The pain is sharp and is intermittent. It's worse at night. Laying down makes it worse. Doesn't feel like pain is controlled with current medications. She has had numbness and tingling in b/l hands. She denies any polyuria or polydipsia. The pain is better when she is warm. She is taking flexeril PRN, neurontin 600 mg TID, Wellbutrin and abilify.    Current Outpatient Prescriptions on File Prior to Visit  Medication Sig Dispense Refill  . acetaminophen (TYLENOL) 650 MG CR tablet Take 1 tablet (650 mg total) by mouth every 8 (eight) hours.  30 tablet  3  . citalopram (CELEXA) 40 MG tablet Take 1 tablet (40 mg total) by mouth daily. (mental health)  90 tablet  2  . cyclobenzaprine (FLEXERIL) 10 MG tablet Take 1 tablet (10 mg total) by mouth 3 (three) times daily as needed for muscle spasms.  90 tablet  1  . dicyclomine (BENTYL) 20 MG tablet Take 1 tablet (20 mg total) by mouth 2 (two) times daily.  20 tablet  0  . diltiazem (CARDIZEM SR) 60 MG 12 hr capsule Take 1 capsule (60 mg total) by mouth 2 (two) times daily.  180 capsule  3  . EPINEPHrine (EPIPEN) 0.3 mg/0.3 mL SOAJ Inject 0.3 mLs (0.3 mg total) into the muscle once.  1 Device  1  . fish oil-omega-3 fatty acids 1000 MG capsule Take 2 g by mouth daily.      Marland Kitchen gabapentin (NEURONTIN) 300 MG capsule Take 2 capsules (600 mg total) by mouth 3 (three) times daily.  180 capsule  0  . hydrochlorothiazide (HYDRODIURIL) 25 MG tablet Take 1 tablet (25 mg total) by mouth daily.  90 tablet  3  . hydrOXYzine (ATARAX/VISTARIL) 10 MG tablet Take 10 mg by mouth 3 (three)  times daily as needed for itching.      . isosorbide mononitrate (IMDUR) 30 MG 24 hr tablet Take 30 mg by mouth daily.      . metFORMIN (GLUCOPHAGE) 500 MG tablet Take 1 tablet (500 mg total) by mouth 2 (two) times daily with a meal.  180 tablet  0  . methocarbamol (ROBAXIN) 500 MG tablet Take 3 tablets (1,500 mg total) by mouth every 6 (six) hours as needed for muscle spasms.  90 tablet  0  . metoprolol tartrate (LOPRESSOR) 25 MG tablet Take 12.5 mg by mouth 2 (two) times daily.      . milk thistle 175 MG tablet Take 350 mg by mouth daily.      Marland Kitchen omeprazole (PRILOSEC) 20 MG capsule take 1 capsule by mouth once daily  30 capsule  3  . polyethylene glycol (MIRALAX / GLYCOLAX) packet Take 17 g by mouth daily as needed (CONSTIPATION).  14 each  1  . potassium chloride (K-DUR) 10 MEQ tablet Take 10 mEq by mouth 2 (two) times daily.      . SUMAtriptan (IMITREX) 50 MG tablet Take 1 tablet (50 mg total) by mouth every 2 (two) hours as needed for migraine or headache. Do not exceed 200  mg daily.  10 tablet  0  . traZODone (DESYREL) 100 MG tablet Take 2 tablets (200 mg total) by mouth at bedtime.  60 tablet  3  . triamcinolone cream (KENALOG) 0.1 % Apply 1 application topically 2 (two) times daily.  30 g  0   No current facility-administered medications on file prior to visit.   Review of Systems See HPI     Objective:   Physical Exam BP 150/97  Pulse 70  Ht 5\' 7"  (1.702 m)  Wt 253 lb (114.76 kg)  BMI 39.62 kg/m2 Gen: NAD, alert, cooperative with exam, well-appearing, obese  MSK: ambulating with cane, normal grip strength b/l, normal abduction and adduction of fingers b/l, FROM of wrist b/l, negative Finkelstein's test b/l, no tenderness on medial or lateral epicondyle b/l on palpation, no pain to palpation on forearm b/l .  Skin: no rashes     Assessment & Plan:

## 2013-11-08 NOTE — Assessment & Plan Note (Addendum)
Feel that this an extension of her chronic pain associated with her depressive sx. Nothing on exam to suggest a MSK origin.  - voltaren 75 mg BID #28 - continue flexeril PRN, gabapentin daily, wellubtrin and abilify and trazadone  - CRP, ESR  - f/u with PCP if pain continues

## 2013-11-09 ENCOUNTER — Telehealth: Payer: Self-pay | Admitting: *Deleted

## 2013-11-09 LAB — SEDIMENTATION RATE: Sed Rate: 4 mm/hr (ref 0–22)

## 2013-11-09 NOTE — Telephone Encounter (Signed)
Message copied by Johny Shears on Fri Nov 09, 2013  2:57 PM ------      Message from: Rosemarie Ax      Created: Fri Nov 09, 2013  2:06 PM       Please call patient and inform her that results are normal. She should follow up with her PCP Kelvin Cellar) for any other concerns. Thank you ------

## 2013-11-09 NOTE — Telephone Encounter (Signed)
Spoke with patient and informed her of below 

## 2013-11-16 ENCOUNTER — Telehealth: Payer: Self-pay | Admitting: Family Medicine

## 2013-11-16 DIAGNOSIS — G8929 Other chronic pain: Secondary | ICD-10-CM

## 2013-11-16 DIAGNOSIS — M549 Dorsalgia, unspecified: Principal | ICD-10-CM

## 2013-11-16 NOTE — Telephone Encounter (Signed)
Needs referral to [facility] for her back

## 2013-11-19 ENCOUNTER — Encounter: Payer: Self-pay | Admitting: Family Medicine

## 2013-11-19 ENCOUNTER — Ambulatory Visit (INDEPENDENT_AMBULATORY_CARE_PROVIDER_SITE_OTHER): Payer: Medicaid Other | Admitting: Family Medicine

## 2013-11-19 VITALS — BP 124/64 | HR 105 | Ht 68.0 in | Wt 249.0 lb

## 2013-11-19 DIAGNOSIS — S43421A Sprain of right rotator cuff capsule, initial encounter: Secondary | ICD-10-CM

## 2013-11-19 DIAGNOSIS — S43429A Sprain of unspecified rotator cuff capsule, initial encounter: Secondary | ICD-10-CM | POA: Insufficient documentation

## 2013-11-20 NOTE — Progress Notes (Signed)
   Subjective:    Patient ID: Norma [name], female    DOB: [birth year], 53 y.o.   MRN: [id number]  HPI Right shoulder pain very similar to the pain she said in her left shoulder. Previously we have done MRI which showed full thickness rotator cuff tear on the left. She has an orthopedic surgeon who is contemplating doing an orthopedic surgery on that. She would like to get her right shoulder evaluated now.   Review of Systems No unusual weight change, fever, sweats, chills.    Objective:   Physical Exam  Vital signs are reviewed GENERAL: Well-developed slightly overweight female no acute distress Shoulder: Right. Weakness and painless supraspinatus testing. Range of motion is full. Internal/external rotation strength is intact. Distally she is neurovascularly intact.      Assessment & Plan:  Right shoulder pain. Certainly these symptoms are similar to those she had in her left shoulder. I was surprised we did an MRI that to find a full-thickness rotator cuff tear. I think it's reasonable to go ahead and evaluate her right shoulder as she prepares to admit to surgery for her left. Will set up for MRI

## 2013-11-21 ENCOUNTER — Encounter: Payer: Self-pay | Admitting: Family Medicine

## 2013-11-21 ENCOUNTER — Ambulatory Visit (INDEPENDENT_AMBULATORY_CARE_PROVIDER_SITE_OTHER): Payer: Medicaid Other | Admitting: Family Medicine

## 2013-11-21 VITALS — BP 144/71 | HR 90 | Temp 99.7°F | Ht 67.0 in | Wt 249.0 lb

## 2013-11-21 DIAGNOSIS — M48062 Spinal stenosis, lumbar region with neurogenic claudication: Secondary | ICD-10-CM | POA: Insufficient documentation

## 2013-11-21 MED ORDER — GABAPENTIN 300 MG PO CAPS: 900.0000 mg | ORAL_CAPSULE | Freq: Three times a day (TID) | ORAL | Status: DC

## 2013-11-21 MED ORDER — TRAMADOL HCL 50 MG PO TABS: 50.0000 mg | ORAL_TABLET | Freq: Four times a day (QID) | ORAL | Status: DC | PRN

## 2013-11-21 NOTE — Progress Notes (Signed)
Norma [name] is a 53 y.o. who presents today for low back pain.  Pt with long term history of DJD with stable spinal stenosis and disc herniations but is having worsening LLE paresthesias and feels like her leg is going to give out.  This has worsened over the past 5-6 weeks now but she denies  bowel/bladder problems, fever, chills, unintentional weight loss, night time awakenings secondary to pain.  She did have TKA of her L knee previously in April of this year and has been [place] this.  For her back, she has been on tylenol, voltaren, flexeril, neurontin, has tried physical therapy and home exercise program with core strengthening all without much improvement.  Her Sx have slowly worsening including paresthesias down the lateral/posterior aspect of her leg into the dorsum of her foot.       Past Medical History  Diagnosis Date  . Hypertension   . Acid reflux   . Tachyarrhythmia   . Left shoulder pain   . Back pain   . Trigger point of thoracic region 03/22/2012  . Irregular heart beat      Physical Exam Filed Vitals:   11/21/13 1537  BP: 144/71  Pulse: 90  Temp: 99.7 F (37.6 C)    Gen: NAD, Well nourished, Well developed Cardio: RRR, No murmurs/gallops/rubs Lungs: CTA, no wheezes, rhonchi, crackles Neuro: CN 2-12 intact, MS 5/5 B/L UE and LE, +2 patellar and achilles relfex b/l  Back Exam: 1.Gait  1. Walk on heels (L5 root) Yes.    Walk on toes (S1 root) Yes.   2. TTP along Lumbar Vertebrae - Yes.   3. Pain with :   1) Extension - No.   2) Flexion - Yes.   4. One Legged Hyperextension for Spondy - No.  5. Straight Leg Raise Yes.    @ 45  1) Radiation into opposite leg - No.   2) Worse with Dorsiflexion of ankle Yes.    6. Sitting Leg Raise - Yes.   @ 45 7. DTR - + 2 B/L LE 8. MS - 5/5 MS  9. Vascular Exam : DP and PT +2 B/L   MRI 11/17/12 lumbar spine Reviewed - Pt has significant central canal narrowing around L4/L5 as well as disc protrusion at L4/L5 (affecting  the L spinal nerve) with facet hypertrophy on the R side and L5/S1 disc protrusion.  Vacuum discs present at both L4/L5 and L5/S1

## 2013-11-21 NOTE — Patient Instructions (Signed)
Please try some of the back exercises given.  As well, we will have you see Dr. Berenice Primas for your back pain.  Thanks, Dr. Awanda Mink

## 2013-11-21 NOTE — Assessment & Plan Note (Signed)
Pt with worsening LLE paresthesias and radiculopathy now c/o occasional weakness.  Her MRI is impressive for DDD and protrusion along with central canal narrowing.  Due to worsening Sx and failure of conservative management, will refer to ortho for surgical options including discectomy and laminectomy.  Another option would be epidural steroid injections, however, i have a feeling she will eventually need spinal surgery.  For now, will increase her neurontin to 900 mg TID and start on tramadol (Denies seizure hx or bulimia) low dose 50 mg q 6 hrs PRN while continuing on the flexeril, tylenol, and Voltaren.  F/U in 4-6 weeks after the surgical consultation for her back.

## 2013-11-23 ENCOUNTER — Other Ambulatory Visit: Payer: Medicaid Other

## 2013-11-27 ENCOUNTER — Telehealth: Payer: Self-pay | Admitting: Family Medicine

## 2013-11-27 DIAGNOSIS — Z1211 Encounter for screening for malignant neoplasm of colon: Secondary | ICD-10-CM

## 2013-11-27 NOTE — Telephone Encounter (Signed)
Patient requesting referral for colonoscopy.

## 2013-11-27 NOTE — Telephone Encounter (Signed)
Will forward to MD to place a referral. Jazmin Hartsell,CMA  

## 2013-11-28 ENCOUNTER — Ambulatory Visit
Admission: RE | Admit: 2013-11-28 | Discharge: 2013-11-28 | Disposition: A | Discharge status: Home / Self Care | Payer: Medicaid Other | Source: Ambulatory Visit | Attending: Family Medicine | Admitting: Family Medicine

## 2013-11-28 DIAGNOSIS — S43421A Sprain of right rotator cuff capsule, initial encounter: Secondary | ICD-10-CM

## 2013-11-29 ENCOUNTER — Other Ambulatory Visit: Payer: Self-pay | Admitting: *Deleted

## 2013-11-29 DIAGNOSIS — S43421A Sprain of right rotator cuff capsule, initial encounter: Secondary | ICD-10-CM

## 2013-11-29 MED ORDER — DIAZEPAM 5 MG PO TABS: ORAL_TABLET | ORAL | Status: DC

## 2013-12-01 ENCOUNTER — Ambulatory Visit (HOSPITAL_BASED_OUTPATIENT_CLINIC_OR_DEPARTMENT_OTHER): Payer: Medicaid Other

## 2013-12-02 ENCOUNTER — Other Ambulatory Visit: Payer: Medicaid Other

## 2013-12-03 ENCOUNTER — Ambulatory Visit
Admission: RE | Admit: 2013-12-03 | Discharge: 2013-12-03 | Disposition: A | Discharge status: Home / Self Care | Payer: Medicaid Other | Source: Ambulatory Visit | Attending: Family Medicine | Admitting: Family Medicine

## 2013-12-03 DIAGNOSIS — S43421A Sprain of right rotator cuff capsule, initial encounter: Secondary | ICD-10-CM

## 2013-12-04 ENCOUNTER — Telehealth: Payer: Self-pay | Admitting: Family Medicine

## 2013-12-04 ENCOUNTER — Encounter: Payer: Self-pay | Admitting: Family Medicine

## 2013-12-04 NOTE — Telephone Encounter (Signed)
Rhea Can u call and tell her she has same kind of rotator cuff tear in this shoulder as she did in the other one THANKS! Dorcas Mcmurray

## 2013-12-05 NOTE — Telephone Encounter (Signed)
Spoke with pt about her rotator cuff tear.

## 2013-12-14 ENCOUNTER — Telehealth: Payer: Self-pay | Admitting: Family Medicine

## 2013-12-14 MED ORDER — IBUPROFEN 800 MG PO TABS
800.0000 mg | ORAL_TABLET | Freq: Three times a day (TID) | ORAL | Status: DC | PRN
Start: 2013-12-14 — End: 2014-02-26

## 2013-12-14 NOTE — Telephone Encounter (Signed)
Refill request for Ibuprofen 800 mg 

## 2013-12-19 ENCOUNTER — Telehealth: Payer: Self-pay | Admitting: Family Medicine

## 2013-12-19 NOTE — Telephone Encounter (Signed)
Pt called and wanted to remind the doctor that when she calls in refills to make sure that they are called in for 90 days. She doesn't have a job or insurance and the 90 day price is so much cheaper.jw

## 2013-12-25 ENCOUNTER — Encounter: Payer: Self-pay | Admitting: Family Medicine

## 2013-12-31 ENCOUNTER — Telehealth: Payer: Self-pay | Admitting: Family Medicine

## 2013-12-31 DIAGNOSIS — E119 Type 2 diabetes mellitus without complications: Secondary | ICD-10-CM

## 2013-12-31 NOTE — Telephone Encounter (Signed)
Needs blood sugar monitor

## 2013-12-31 NOTE — Telephone Encounter (Signed)
Okay. Has she had one in the past, and if so which kind was she using. If she feels the need to obtain a cbg monitor then maybe it is time to obtain a new hgbA1C. [facility], MD

## 2013-12-31 NOTE — Telephone Encounter (Signed)
Pt states that she has never had a glucometer and she does still have medicaid.  Appointment made for 01/14/14. [place], Salome Spotted

## 2014-01-03 ENCOUNTER — Ambulatory Visit: Payer: Medicaid Other

## 2014-01-14 ENCOUNTER — Encounter: Payer: Self-pay | Admitting: Family Medicine

## 2014-01-14 ENCOUNTER — Ambulatory Visit (INDEPENDENT_AMBULATORY_CARE_PROVIDER_SITE_OTHER): Payer: Medicaid Other | Admitting: Family Medicine

## 2014-01-14 VITALS — BP 159/100 | HR 83 | Temp 98.2°F | Ht 68.0 in | Wt 253.0 lb

## 2014-01-14 DIAGNOSIS — R7309 Other abnormal glucose: Secondary | ICD-10-CM | POA: Diagnosis present

## 2014-01-14 DIAGNOSIS — S91209S Unspecified open wound of unspecified toe(s) with damage to nail, sequela: Secondary | ICD-10-CM

## 2014-01-14 DIAGNOSIS — I10 Essential (primary) hypertension: Secondary | ICD-10-CM | POA: Diagnosis not present

## 2014-01-14 DIAGNOSIS — R739 Hyperglycemia, unspecified: Secondary | ICD-10-CM

## 2014-01-14 DIAGNOSIS — R7303 Prediabetes: Secondary | ICD-10-CM | POA: Insufficient documentation

## 2014-01-14 DIAGNOSIS — IMO0002 Reserved for concepts with insufficient information to code with codable children: Secondary | ICD-10-CM | POA: Diagnosis not present

## 2014-01-14 DIAGNOSIS — R21 Rash and other nonspecific skin eruption: Secondary | ICD-10-CM | POA: Diagnosis not present

## 2014-01-14 DIAGNOSIS — S91209A Unspecified open wound of unspecified toe(s) with damage to nail, initial encounter: Secondary | ICD-10-CM | POA: Insufficient documentation

## 2014-01-14 DIAGNOSIS — Z23 Encounter for immunization: Secondary | ICD-10-CM

## 2014-01-14 LAB — POCT GLYCOSYLATED HEMOGLOBIN (HGB A1C): HEMOGLOBIN A1C: 5.9

## 2014-01-14 MED ORDER — TRIAMCINOLONE ACETONIDE 0.1 % EX CREA: 1.0000 "application " | TOPICAL_CREAM | Freq: Two times a day (BID) | CUTANEOUS | Status: DC

## 2014-01-14 MED ORDER — BLOOD GLUCOSE METER KIT: PACK | Status: DC

## 2014-01-14 MED ORDER — METOPROLOL TARTRATE 25 MG PO TABS: 25.0000 mg | ORAL_TABLET | Freq: Two times a day (BID) | ORAL | Status: DC

## 2014-01-14 NOTE — Assessment & Plan Note (Signed)
A1C again pre-diabetes Pt has resumed metformin (which will hopefully help with weight loss) Given note for surgeon that insurance will not cover meter most likely

## 2014-01-14 NOTE — Assessment & Plan Note (Signed)
S/p removal has grown back and is still bothering her Pt interested in podiatry referral Awaiting her call

## 2014-01-14 NOTE — Progress Notes (Signed)
Patient ID: Norma [name], female   DOB: Dec 19, 1960, 53 y.o.   MRN: [id number]   [facility] [name], MD Phone: [phone]  Subjective:  Norma [name] is a 53 y.o F who is well known to me who presents for f/up on chronic   #HTN -pt is currently attributing to pain -does not take blood pressure at home: takes at pharmacy occasionally 150s-160s/90s -also has considerable amt of stress at home -attests to palps occasionally but no chest pain SOB  #DM -A1Cs in the past have been in the pre-diabetic range -had previously been on metformin but stopped by herself, then restarted 535m BID, no concerns or complaints with this -had a question regarding upcoming procedure to lower back  #Toe nail avulsion -has had toenail removed in the past here -would like a special foot doctor to take a look -not currently interested in further treatment at [facility]  #Rash -needs refill on cream- gets an allergic reaction on face and hands occasionally  All systems were reviewed and were negative unless otherwise noted in the HPI  Current Outpatient Prescriptions  Medication Sig Dispense Refill  . acetaminophen (TYLENOL) 650 MG CR tablet Take 1 tablet (650 mg total) by mouth every 8 (eight) hours.  30 tablet  3  . Blood Glucose Monitoring Suppl (BLOOD GLUCOSE METER KIT AND SUPPLIES) Dispense based on patient and insurance preference. Use up to four times daily as directed. (FOR ICD-9 250.00, 250.01).  1 each  0  . citalopram (CELEXA) 40 MG tablet Take 1 tablet (40 mg total) by mouth daily. (mental health)  90 tablet  2  . cyclobenzaprine (FLEXERIL) 10 MG tablet Take 1 tablet (10 mg total) by mouth 3 (three) times daily as needed for muscle spasms.  90 tablet  1  . diazepam (VALIUM) 5 MG tablet Take 1 hour before procedure  2 tablet  0  . diclofenac (VOLTAREN) 75 MG EC tablet Take 1 tablet (75 mg total) by mouth 2 (two) times daily.  28 tablet  0  . dicyclomine (BENTYL) 20 MG  tablet Take 1 tablet (20 mg total) by mouth 2 (two) times daily.  20 tablet  0  . diltiazem (CARDIZEM SR) 60 MG 12 hr capsule Take 1 capsule (60 mg total) by mouth 2 (two) times daily.  180 capsule  3  . EPINEPHrine (EPIPEN) 0.3 mg/0.3 mL SOAJ Inject 0.3 mLs (0.3 mg total) into the muscle once.  1 Device  1  . fish oil-omega-3 fatty acids 1000 MG capsule Take 2 g by mouth daily.      .Marland Kitchengabapentin (NEURONTIN) 300 MG capsule Take 3 capsules (900 mg total) by mouth 3 (three) times daily.  180 capsule  2  . hydrochlorothiazide (HYDRODIURIL) 25 MG tablet Take 1 tablet (25 mg total) by mouth daily.  90 tablet  3  . hydrOXYzine (ATARAX/VISTARIL) 10 MG tablet Take 10 mg by mouth 3 (three) times daily as needed for itching.      .Marland Kitchenibuprofen (ADVIL,MOTRIN) 800 MG tablet Take 1 tablet (800 mg total) by mouth every 8 (eight) hours as needed.  30 tablet  0  . isosorbide mononitrate (IMDUR) 30 MG 24 hr tablet Take 30 mg by mouth daily.      . metFORMIN (GLUCOPHAGE) 500 MG tablet Take 1 tablet (500 mg total) by mouth 2 (two) times daily with a meal.  180 tablet  0  . metoprolol tartrate (LOPRESSOR) 25 MG tablet Take 1 tablet (25  mg total) by mouth 2 (two) times daily.  180 tablet  3  . milk thistle 175 MG tablet Take 350 mg by mouth daily.      Marland Kitchen omeprazole (PRILOSEC) 20 MG capsule take 1 capsule by mouth once daily  30 capsule  3  . polyethylene glycol (MIRALAX / GLYCOLAX) packet Take 17 g by mouth daily as needed (CONSTIPATION).  14 each  1  . potassium chloride (K-DUR) 10 MEQ tablet Take 10 mEq by mouth 2 (two) times daily.      . SUMAtriptan (IMITREX) 50 MG tablet Take 1 tablet (50 mg total) by mouth every 2 (two) hours as needed for migraine or headache. Do not exceed 200 mg daily.  10 tablet  0  . traMADol (ULTRAM) 50 MG tablet Take 1 tablet (50 mg total) by mouth every 6 (six) hours as needed.  60 tablet  0  . traZODone (DESYREL) 100 MG tablet Take 2 tablets (200 mg total) by mouth at bedtime.  60 tablet  3   . triamcinolone cream (KENALOG) 0.1 % Apply 1 application topically 2 (two) times daily.  30 g  0   No current facility-administered medications for this visit.    Reviewed problem list.  Medications- reviewed and updated Chief complaint-noted No additions to family history Social history- patient is a never smoker  Objective: BP 159/100  Pulse 83  Temp(Src) 98.2 F (36.8 C) (Oral)  Ht 5' 8"  (1.727 m)  Wt 253 lb (114.76 kg)  BMI 38.48 kg/m2 Gen: NAD, alert, cooperative with exam Neck: FROM, supple CV: RRR, no m/r/g, JVP flat; DPs 2+ Neuro: Alert and oriented, No gross deficits Skin: no rashes no lesions, prior surgical scars , toe nail avulsion, fibrous tissue mobile on plantar surface of foot  Assessment/Plan: See problem based a/p

## 2014-01-14 NOTE — Assessment & Plan Note (Addendum)
Not at goal Intermittently elevated (of last 10 readings 3 elevated above goal and 7 at goal) Also borderline tachy in the past Will increase metoprolol to 25 BID today Advised pt to follow up with cards (unclear when last saw) Questionable arrhythmia in the past? Also on dilt/imdur

## 2014-01-14 NOTE — Assessment & Plan Note (Signed)
Pt wants kenalog on standby Advised to not use/use extremely sparingly on the face

## 2014-01-14 NOTE — Patient Instructions (Signed)
Norma [name], It was good to see you today!  Please use the kenalog cream on your face sparingly if you have an allergic reaction (steroids can thin your skin)  I have called in the meter but it may not be covered Please let us know if your surgeon has any further questions  Increase your metoprolol to 1 tab twice a day Make appointment to see your heart doctor following surgery  Feel better soon! [name], MD

## 2014-01-16 ENCOUNTER — Telehealth: Payer: Self-pay | Admitting: *Deleted

## 2014-01-16 NOTE — Telephone Encounter (Signed)
Pt call stating she received a flu shot at her last office visit and had a migraine ever since.  She has taken her medications for the migraine and nothing is helping.  Pt stated she would like a Toradol injection to help with her headache.  Pt informed that she will need an office visit with a provider for a Toradol injection will be considered.  Per Dr. Skeet Simmer have patient try Excedrin.  Pt told if she did not want to wait until tomorrow for an appt; she should go to urgent care.  Pt stated she did not want to go there either and hung up.  Derl Barrow, RN

## 2014-01-24 ENCOUNTER — Ambulatory Visit: Payer: Medicaid Other | Admitting: Family Medicine

## 2014-01-29 ENCOUNTER — Telehealth: Payer: Self-pay | Admitting: Family Medicine

## 2014-01-29 DIAGNOSIS — S43429D Sprain of unspecified rotator cuff capsule, subsequent encounter: Secondary | ICD-10-CM

## 2014-01-29 NOTE — Telephone Encounter (Signed)
Pt called and needs another referral for her shoulder. Please call patient when this is done. jw

## 2014-01-29 NOTE — Telephone Encounter (Signed)
Will forward to Md to place a current referral. Cale Decarolis,CMA

## 2014-01-30 NOTE — Telephone Encounter (Signed)
Pt needs a new referral to Dr. Dorna Leitz at [place] for her left rotator cuff.  States that they had discussed surgery last time but had put it off due to other health issues.  She would like to speak with him again about this.  Appt made for 02/05/14 @ 10:30am.  Pt is aware of this time and date.  Reminder card mailed.  Can you please place a new referral so I can document this?  Thanks [employer]

## 2014-01-30 NOTE — Telephone Encounter (Signed)
Is this sports med or ortho? And why does she need another one; that was just done [facility] ,MD

## 2014-01-31 NOTE — Telephone Encounter (Signed)
Ok

## 2014-01-31 NOTE — Telephone Encounter (Signed)
documented in referral. Norma [name], Norma [name]

## 2014-02-20 HISTORY — PX: ROTATOR CUFF REPAIR: SHX139

## 2014-02-26 ENCOUNTER — Other Ambulatory Visit: Payer: Self-pay | Admitting: Family Medicine

## 2014-03-07 ENCOUNTER — Encounter (HOSPITAL_COMMUNITY): Payer: Self-pay | Admitting: Emergency Medicine

## 2014-03-07 ENCOUNTER — Emergency Department (HOSPITAL_COMMUNITY)
Admission: EM | Admit: 2014-03-07 | Discharge: 2014-03-07 | Disposition: A | Discharge status: Home / Self Care | Payer: Medicaid Other | Attending: Emergency Medicine | Admitting: Emergency Medicine

## 2014-03-07 DIAGNOSIS — M545 Low back pain, unspecified: Secondary | ICD-10-CM | POA: Diagnosis present

## 2014-03-07 DIAGNOSIS — Z79899 Other long term (current) drug therapy: Secondary | ICD-10-CM | POA: Insufficient documentation

## 2014-03-07 DIAGNOSIS — M5442 Lumbago with sciatica, left side: Secondary | ICD-10-CM | POA: Diagnosis not present

## 2014-03-07 DIAGNOSIS — Z7952 Long term (current) use of systemic steroids: Secondary | ICD-10-CM | POA: Insufficient documentation

## 2014-03-07 DIAGNOSIS — I10 Essential (primary) hypertension: Secondary | ICD-10-CM | POA: Diagnosis not present

## 2014-03-07 DIAGNOSIS — Z791 Long term (current) use of non-steroidal anti-inflammatories (NSAID): Secondary | ICD-10-CM | POA: Insufficient documentation

## 2014-03-07 DIAGNOSIS — K219 Gastro-esophageal reflux disease without esophagitis: Secondary | ICD-10-CM | POA: Insufficient documentation

## 2014-03-07 MED ORDER — OXYCODONE HCL 5 MG PO TABS: 5.0000 mg | ORAL_TABLET | ORAL | Status: DC | PRN

## 2014-03-07 MED ORDER — OXYCODONE-ACETAMINOPHEN 5-325 MG PO TABS
1.0000 | ORAL_TABLET | Freq: Once | ORAL | Status: AC
  Administered 2014-03-07: 1 via ORAL
  Filled 2014-03-07: qty 1

## 2014-03-07 MED ORDER — HYDROMORPHONE HCL 1 MG/ML IJ SOLN
1.0000 mg | Freq: Once | INTRAMUSCULAR | Status: AC
  Administered 2014-03-07: 1 mg via INTRAVENOUS
  Filled 2014-03-07: qty 1

## 2014-03-07 NOTE — ED Notes (Signed)
Patient able to walk with noticeable pain. MD Aline Brochure visualized patient ambulating. Advised patient to walk throughout the day and not to lay in the bed for long periods of time.

## 2014-03-07 NOTE — ED Notes (Signed)
Hx of lt rotator cuff surgery 4th of this month. Since then having lt lower back radiating through left hip down to left lower leg pain. Today leg pain is the worst it's ever been

## 2014-03-07 NOTE — ED Notes (Signed)
Bed: WA08 Expected date:  Expected time:  Means of arrival:  Comments: Back pain/fentanyl

## 2014-03-07 NOTE — ED Provider Notes (Signed)
CSN: [id number]     Arrival date & time 03/07/14  1216 History   First MD Initiated Contact with Patient 03/07/14 1452     Chief Complaint  Patient presents with  . Back Pain     (Consider location/radiation/quality/duration/timing/severity/associated sxs/prior Treatment) Patient is a 53 y.o. female presenting with back pain. The history is provided by the patient.  Back Pain Location:  Lumbar spine Quality:  Aching Radiates to:  L foot Pain severity:  Moderate Pain is:  Same all the time Onset quality:  Gradual Duration:  4 days Timing:  Constant Progression:  Worsening Chronicity:  New Context comment:  S/p recent left shoulder surgery and the patient has been lying in bed Relieved by:  Nothing Worsened by:  Nothing tried Ineffective treatments: oxycodone. Associated symptoms: no abdominal pain, no chest pain, no dysuria, no fever and no headaches     Past Medical History  Diagnosis Date  . Hypertension   . Acid reflux   . Tachyarrhythmia   . Left shoulder pain   . Back pain   . Trigger point of thoracic region 03/22/2012  . Irregular heart beat    Past Surgical History  Procedure Laterality Date  . Ankle surgery    . Rotator cuff repair Left 02/20/14   Family History  Problem Relation Age of Onset  . Diabetes Brother   . Diabetes Maternal Aunt   . Diabetes Paternal Aunt   . Diabetes Maternal Grandmother   . Diabetes Maternal Grandfather   . Diabetes Paternal Grandmother   . Diabetes Paternal Grandfather    History  Substance Use Topics  . Smoking status: Never Smoker   . Smokeless tobacco: Never Used  . Alcohol Use: Yes     Comment: occasionally   OB History    No data available     Review of Systems  Constitutional: Negative for fever and fatigue.  HENT: Negative for congestion and drooling.   Eyes: Negative for pain.  Respiratory: Negative for cough and shortness of breath.   Cardiovascular: Negative for chest pain.  Gastrointestinal:  Negative for nausea, vomiting, abdominal pain and diarrhea.  Genitourinary: Negative for dysuria and hematuria.  Musculoskeletal: Positive for back pain. Negative for gait problem and neck pain.  Skin: Negative for color change.  Neurological: Negative for dizziness and headaches.  Hematological: Negative for adenopathy.  Psychiatric/Behavioral: Negative for behavioral problems.  All other systems reviewed and are negative.     Allergies  Soma; Nortriptyline; and Prednisone  Home Medications   Prior to Admission medications   Medication Sig Start Date End Date Taking? Authorizing Provider  ARIPiprazole (ABILIFY) 2 MG tablet Take 2 mg by mouth daily.   Yes Historical Provider, MD  beta carotene w/minerals (OCUVITE) tablet Take 1 tablet by mouth daily.   Yes Historical Provider, MD  buPROPion (WELLBUTRIN XL) 300 MG 24 hr tablet Take 300 mg by mouth daily.   Yes Historical Provider, MD  EPINEPHrine (EPIPEN) 0.3 mg/0.3 mL SOAJ Inject 0.3 mLs (0.3 mg total) into the muscle once. 11/02/12  Yes [name], MD  fish oil-omega-3 fatty acids 1000 MG capsule Take 2 g by mouth daily.   Yes Historical Provider, MD  influenza vac recombinant HA trivalent (FLUBLOK) injection Inject 0.5 mLs into the muscle once.   Yes Historical Provider, MD  methocarbamol (ROBAXIN) 500 MG tablet Take 500 mg by mouth daily.   Yes Historical Provider, MD  Multiple Vitamin (MULTIVITAMIN WITH MINERALS) TABS tablet Take 1 tablet by mouth  daily.   Yes Historical Provider, MD  oxyCODONE-acetaminophen (PERCOCET/ROXICET) 5-325 MG per tablet Take 2 tablets by mouth every 4 (four) hours as needed for severe pain.   Yes Historical Provider, MD  traZODone (DESYREL) 100 MG tablet Take 2 tablets (200 mg total) by mouth at bedtime. 05/07/13  Yes Josalyn C Funches, MD  acetaminophen (TYLENOL) 650 MG CR tablet Take 1 tablet (650 mg total) by mouth every 8 (eight) hours. 03/12/13   Dayarmys Piloto de Gwendalyn Ege, MD  Blood Glucose  Monitoring Suppl (BLOOD GLUCOSE METER KIT AND SUPPLIES) Dispense based on patient and insurance preference. Use up to four times daily as directed. (FOR ICD-9 250.00, 250.01). 01/14/14   [name], MD  citalopram (CELEXA) 40 MG tablet Take 1 tablet (40 mg total) by mouth daily. (mental health) 09/04/13   [name], MD  cyclobenzaprine (FLEXERIL) 10 MG tablet Take 1 tablet (10 mg total) by mouth 3 (three) times daily as needed for muscle spasms. 08/20/13   [name], MD  diazepam (VALIUM) 5 MG tablet Take 1 hour before procedure 11/29/13   Dickie La, MD  diclofenac (VOLTAREN) 75 MG EC tablet Take 1 tablet (75 mg total) by mouth 2 (two) times daily. 11/08/13   Rosemarie Ax, MD  dicyclomine (BENTYL) 20 MG tablet Take 1 tablet (20 mg total) by mouth 2 (two) times daily. 05/24/13   Marin Olp, MD  diltiazem (CARDIZEM SR) 60 MG 12 hr capsule Take 1 capsule (60 mg total) by mouth 2 (two) times daily. 05/08/13   Josalyn C Funches, MD  gabapentin (NEURONTIN) 300 MG capsule Take 3 capsules (900 mg total) by mouth 3 (three) times daily. 11/21/13   Tamela Oddi Hess, DO  hydrochlorothiazide (HYDRODIURIL) 25 MG tablet Take 1 tablet (25 mg total) by mouth daily. 07/05/12   Minerva Ends, MD  hydrOXYzine (ATARAX/VISTARIL) 10 MG tablet Take 10 mg by mouth 3 (three) times daily as needed for itching.    Historical Provider, MD  ibuprofen (ADVIL,MOTRIN) 800 MG tablet take 1 tablet by mouth every 8 hours if needed 02/26/14   [name], MD  isosorbide mononitrate (IMDUR) 30 MG 24 hr tablet Take 30 mg by mouth daily.    Historical Provider, MD  metFORMIN (GLUCOPHAGE) 500 MG tablet Take 1 tablet (500 mg total) by mouth 2 (two) times daily with a meal. 05/07/13   Josalyn C Funches, MD  metoprolol tartrate (LOPRESSOR) 25 MG tablet Take 1 tablet (25 mg total) by mouth 2 (two) times daily. 01/14/14   [name], MD  milk thistle 175 MG tablet Take 350 mg by mouth daily.    Historical Provider, MD   omeprazole (PRILOSEC) 20 MG capsule take 1 capsule by mouth once daily    [name], MD  polyethylene glycol (MIRALAX / GLYCOLAX) packet Take 17 g by mouth daily as needed (CONSTIPATION). 05/24/13   Marin Olp, MD  potassium chloride (K-DUR) 10 MEQ tablet Take 10 mEq by mouth 2 (two) times daily.    Historical Provider, MD  SUMAtriptan (IMITREX) 50 MG tablet Take 1 tablet (50 mg total) by mouth every 2 (two) hours as needed for migraine or headache. Do not exceed 200 mg daily. 06/20/13   Coral Spikes, DO  traMADol (ULTRAM) 50 MG tablet Take 1 tablet (50 mg total) by mouth every 6 (six) hours as needed. 11/21/13   Tamela Oddi Hess, DO  triamcinolone cream (KENALOG) 0.1 % Apply 1  application topically 2 (two) times daily. 01/14/14   [name], MD   BP 148/96 mmHg  Pulse 88  Temp(Src) 98.2 F (36.8 C) (Oral)  Resp 20  SpO2 97% Physical Exam  Constitutional: She is oriented to person, place, and time. She appears well-developed and well-nourished.  HENT:  Head: Normocephalic.  Mouth/Throat: Oropharynx is clear and moist. No oropharyngeal exudate.  Eyes: Conjunctivae and EOM are normal. Pupils are equal, round, and reactive to light.  Neck: Normal range of motion. Neck supple.  Cardiovascular: Normal rate, regular rhythm, normal heart sounds and intact distal pulses.  Exam reveals no gallop and no friction rub.   No murmur heard. Pulmonary/Chest: Effort normal and breath sounds normal. No respiratory distress. She has no wheezes.  Abdominal: Soft. Bowel sounds are normal. There is no tenderness. There is no rebound and no guarding.  Musculoskeletal: Normal range of motion. She exhibits no edema or tenderness.  Sutures to left anterior shoulder without evidence of surrounding infection.  Mild tenderness to palpation of the midline lower lumbar area.   Neurological: She is alert and oriented to person, place, and time.  Reflex Scores:      Patellar reflexes are 1+ on the right  side and 1+ on the left side.      Achilles reflexes are 1+ on the right side and 1+ on the left side. Normal strength and sensation in bilateral lower extremities.  2+ distal pulses in bilateral lower extremities.  Skin: Skin is warm and dry.  Psychiatric: She has a normal mood and affect. Her behavior is normal.  Nursing note and vitals reviewed.   ED Course  Procedures (including critical care time) Labs Review Labs Reviewed - No data to display  Imaging Review No results found.   EKG Interpretation None      MDM   Final diagnoses:  Low back pain with left-sided sciatica, unspecified back pain laterality    3:01 PM 53 y.o. female w hx of HTN, low back pain, s/p left rotator cuff surg on Nov 4 who presents with acute worsening of her lower back pain. She states that she has a history of low back pain with radiation down to her left foot. She notes that her back pain has been worse since her left shoulder surgery on the fourth of this month. She states that she has had difficulty walking over the last 4 days due to pain and has mostly been lying in her bed. She denies any fevers or back pain red flags. She has had similar symptoms previously likely worsened by her increased bed rest after the surgery. She is afebrile and vital signs are unremarkable here. She has normal strength and sensation with some intermittent paresthesias in her left lower extremity. She denies any injuries. I suspect this is a pain control issue. We'll give 1 mg of Dilaudid and Percocet.  4:56 PM: Pt ambulatory after pain control. Feeling much better. Will provide Rx for roxicet for breakthrough pain.  I have discussed the diagnosis/risks/treatment options with the patient and family and believe the pt to be eligible for discharge home to follow-up with her orthopedist as needed. We also discussed returning to the ED immediately if new or worsening sx occur. We discussed the sx which are most concerning (e.g.,  worsening pain, fever, bp red flags discussed) that necessitate immediate return. Medications administered to the patient during their visit and any new prescriptions provided to the patient are listed below.  Medications given during  this visit Medications  oxyCODONE-acetaminophen (PERCOCET/ROXICET) 5-325 MG per tablet 1 tablet (1 tablet Oral Given 03/07/14 1505)  HYDROmorphone (DILAUDID) injection 1 mg (1 mg Intravenous Given 03/07/14 1505)    Discharge Medication List as of 03/07/2014  5:00 PM    START taking these medications   Details  oxyCODONE (ROXICODONE) 5 MG immediate release tablet Take 1 tablet (5 mg total) by mouth every 4 (four) hours as needed for breakthrough pain., Starting 03/07/2014, Until Discontinued, Print         Pamella Pert, MD 03/08/14 2052

## 2014-03-07 NOTE — Discharge Instructions (Signed)
Back Pain, Adult °Back pain is very common. The pain often gets better over time. The cause of back pain is usually not dangerous. Most people can learn to manage their back pain on their own.  °HOME CARE  °· Stay active. Start with short walks on flat ground if you can. Try to walk farther each day. °· Do not sit, drive, or stand in one place for more than 30 minutes. Do not stay in bed. °· Do not avoid exercise or work. Activity can help your back heal faster. °· Be careful when you bend or lift an object. Bend at your knees, keep the object close to you, and do not twist. °· Sleep on a firm mattress. Lie on your side, and bend your knees. If you lie on your back, put a pillow under your knees. °· Only take medicines as told by your doctor. °· Put ice on the injured area. °¨ Put ice in a plastic bag. °¨ Place a towel between your skin and the bag. °¨ Leave the ice on for 15-20 minutes, 03-04 times a day for the first 2 to 3 days. After that, you can switch between ice and heat packs. °· Ask your doctor about back exercises or massage. °· Avoid feeling anxious or stressed. Find good ways to deal with stress, such as exercise. °GET HELP RIGHT AWAY IF:  °· Your pain does not go away with rest or medicine. °· Your pain does not go away in 1 week. °· You have new problems. °· You do not feel well. °· The pain spreads into your legs. °· You cannot control when you poop (bowel movement) or pee (urinate). °· Your arms or legs feel weak or lose feeling (numbness). °· You feel sick to your stomach (nauseous) or throw up (vomit). °· You have belly (abdominal) pain. °· You feel like you may pass out (faint). °MAKE SURE YOU:  °· Understand these instructions. °· Will watch your condition. °· Will get help right away if you are not doing well or get worse. °Document Released: 09/22/2007 Document Revised: 06/28/2011 Document Reviewed: 08/07/2013 °Exit[employer]® Patient Information ©2015 [employer]. This information is not intended  to replace advice given to you by your health care provider. Make sure you discuss any questions you have with your health care provider. ° °

## 2014-03-07 NOTE — ED Notes (Signed)
MD at bedside. Brought pt warm blankets, updated vital signs

## 2014-03-11 ENCOUNTER — Encounter (HOSPITAL_COMMUNITY): Payer: Self-pay | Admitting: *Deleted

## 2014-03-11 ENCOUNTER — Emergency Department (HOSPITAL_COMMUNITY)
Admission: EM | Admit: 2014-03-11 | Discharge: 2014-03-11 | Disposition: A | Discharge status: Home / Self Care | Payer: Medicaid Other | Attending: Emergency Medicine | Admitting: Emergency Medicine

## 2014-03-11 DIAGNOSIS — K219 Gastro-esophageal reflux disease without esophagitis: Secondary | ICD-10-CM | POA: Insufficient documentation

## 2014-03-11 DIAGNOSIS — M5432 Sciatica, left side: Secondary | ICD-10-CM | POA: Insufficient documentation

## 2014-03-11 DIAGNOSIS — Z791 Long term (current) use of non-steroidal anti-inflammatories (NSAID): Secondary | ICD-10-CM | POA: Insufficient documentation

## 2014-03-11 DIAGNOSIS — M79605 Pain in left leg: Secondary | ICD-10-CM | POA: Diagnosis present

## 2014-03-11 DIAGNOSIS — I10 Essential (primary) hypertension: Secondary | ICD-10-CM | POA: Diagnosis not present

## 2014-03-11 DIAGNOSIS — Z79899 Other long term (current) drug therapy: Secondary | ICD-10-CM | POA: Insufficient documentation

## 2014-03-11 MED ORDER — HYDROMORPHONE HCL 1 MG/ML IJ SOLN
1.0000 mg | Freq: Once | INTRAMUSCULAR | Status: AC
  Administered 2014-03-11: 1 mg via INTRAMUSCULAR
  Filled 2014-03-11: qty 1

## 2014-03-11 MED ORDER — OXYCODONE-ACETAMINOPHEN 5-325 MG PO TABS
1.0000 | ORAL_TABLET | Freq: Once | ORAL | Status: AC
  Administered 2014-03-11: 1 via ORAL
  Filled 2014-03-11: qty 1

## 2014-03-11 MED ORDER — DIAZEPAM 5 MG PO TABS
5.0000 mg | ORAL_TABLET | Freq: Once | ORAL | Status: AC
  Administered 2014-03-11: 5 mg via ORAL
  Filled 2014-03-11: qty 1

## 2014-03-11 MED ORDER — KETOROLAC TROMETHAMINE 60 MG/2ML IM SOLN
30.0000 mg | Freq: Once | INTRAMUSCULAR | Status: AC
  Administered 2014-03-11: 30 mg via INTRAMUSCULAR
  Filled 2014-03-11: qty 2

## 2014-03-11 NOTE — ED Notes (Signed)
Pt reports pain in left hip radiating to lower back. Symptoms started 02/20/2014. Pt took oxycodone x 1 @ 9.00 a.m and percocet x 2 @ 9.00 a.m. Today.

## 2014-03-11 NOTE — ED Notes (Signed)
Bed: WA09 Expected date:  Expected time:  Means of arrival:  Comments: Ems- leg and hip pain

## 2014-03-11 NOTE — Discharge Instructions (Signed)
Back Exercises Back exercises help treat and prevent back injuries. The goal of back exercises is to increase the strength of your abdominal and back muscles and the flexibility of your back. These exercises should be started when you no longer have back pain. Back exercises include:  Pelvic Tilt. Lie on your back with your knees bent. Tilt your pelvis until the lower part of your back is against the floor. Hold this position 5 to 10 sec and repeat 5 to 10 times.  Knee to Chest. Pull first 1 knee up against your chest and hold for 20 to 30 seconds, repeat this with the other knee, and then both knees. This may be done with the other leg straight or bent, whichever feels better.  Sit-Ups or Curl-Ups. Bend your knees 90 degrees. Start with tilting your pelvis, and do a partial, slow sit-up, lifting your trunk only 30 to 45 degrees off the floor. Take at least 2 to 3 seconds for each sit-up. Do not do sit-ups with your knees out straight. If partial sit-ups are difficult, simply do the above but with only tightening your abdominal muscles and holding it as directed.  Hip-Lift. Lie on your back with your knees flexed 90 degrees. Push down with your feet and shoulders as you raise your hips a couple inches off the floor; hold for 10 seconds, repeat 5 to 10 times.  Back arches. Lie on your stomach, propping yourself up on bent elbows. Slowly press on your hands, causing an arch in your low back. Repeat 3 to 5 times. Any initial stiffness and discomfort should lessen with repetition over time.  Shoulder-Lifts. Lie face down with arms beside your body. Keep hips and torso pressed to floor as you slowly lift your head and shoulders off the floor. Do not overdo your exercises, especially in the beginning. Exercises may cause you some mild back discomfort which lasts for a few minutes; however, if the pain is more severe, or lasts for more than 15 minutes, do not continue exercises until you see your caregiver.  Improvement with exercise therapy for back problems is slow.  See your caregivers for assistance with developing a proper back exercise program. Document Released: 05/13/2004 Document Revised: 06/28/2011 Document Reviewed: 02/04/2011 Exit[employer] Patient Information 2015 [employer]. This information is not intended to replace advice given to you by your health care provider. Make sure you discuss any questions you have with your health care provider.   Sciatica Sciatica is pain, weakness, numbness, or tingling along the path of the sciatic nerve. The nerve starts in the lower back and runs down the back of each leg. The nerve controls the muscles in the lower leg and in the back of the knee, while also providing sensation to the back of the thigh, lower leg, and the sole of your foot. Sciatica is a symptom of another medical condition. For instance, nerve damage or certain conditions, such as a herniated disk or bone spur on the spine, pinch or put pressure on the sciatic nerve. This causes the pain, weakness, or other sensations normally associated with sciatica. Generally, sciatica only affects one side of the body. CAUSES   Herniated or slipped disc.  Degenerative disk disease.  A pain disorder involving the narrow muscle in the buttocks (piriformis syndrome).  Pelvic injury or fracture.  Pregnancy.  Tumor (rare). SYMPTOMS  Symptoms can vary from mild to very severe. The symptoms usually travel from the low back to the buttocks and down the back   of the leg. Symptoms can include:  Mild tingling or dull aches in the lower back, leg, or hip.  Numbness in the back of the calf or sole of the foot.  Burning sensations in the lower back, leg, or hip.  Sharp pains in the lower back, leg, or hip.  Leg weakness.  Severe back pain inhibiting movement. These symptoms may get worse with coughing, sneezing, laughing, or prolonged sitting or standing. Also, being overweight may worsen  symptoms. DIAGNOSIS  Your caregiver will perform a physical exam to look for common symptoms of sciatica. He or she may ask you to do certain movements or activities that would trigger sciatic nerve pain. Other tests may be performed to find the cause of the sciatica. These may include:  Blood tests.  X-rays.  Imaging tests, such as an MRI or CT scan. TREATMENT  Treatment is directed at the cause of the sciatic pain. Sometimes, treatment is not necessary and the pain and discomfort goes away on its own. If treatment is needed, your caregiver may suggest:  Over-the-counter medicines to relieve pain.  Prescription medicines, such as anti-inflammatory medicine, muscle relaxants, or narcotics.  Applying heat or ice to the painful area.  Steroid injections to lessen pain, irritation, and inflammation around the nerve.  Reducing activity during periods of pain.  Exercising and stretching to strengthen your abdomen and improve flexibility of your spine. Your caregiver may suggest losing weight if the extra weight makes the back pain worse.  Physical therapy.  Surgery to eliminate what is pressing or pinching the nerve, such as a bone spur or part of a herniated disk. HOME CARE INSTRUCTIONS   Only take over-the-counter or prescription medicines for pain or discomfort as directed by your caregiver.  Apply ice to the affected area for 20 minutes, 3-4 times a day for the first 48-72 hours. Then try heat in the same way.  Exercise, stretch, or perform your usual activities if these do not aggravate your pain.  Attend physical therapy sessions as directed by your caregiver.  Keep all follow-up appointments as directed by your caregiver.  Do not wear high heels or shoes that do not provide proper support.  Check your mattress to see if it is too soft. A firm mattress may lessen your pain and discomfort. SEEK IMMEDIATE MEDICAL CARE IF:   You lose control of your bowel or bladder  (incontinence).  You have increasing weakness in the lower back, pelvis, buttocks, or legs.  You have redness or swelling of your back.  You have a burning sensation when you urinate.  You have pain that gets worse when you lie down or awakens you at night.  Your pain is worse than you have experienced in the past.  Your pain is lasting longer than 4 weeks.  You are suddenly losing weight without reason. MAKE SURE YOU:  Understand these instructions.  Will watch your condition.  Will get help right away if you are not doing well or get worse. Document Released: 03/30/2001 Document Revised: 10/05/2011 Document Reviewed: 08/15/2011 [employer] Patient Information 2015 [employer]. This information is not intended to replace advice given to you by your health care provider. Make sure you discuss any questions you have with your health care provider.  

## 2014-03-11 NOTE — ED Notes (Signed)
Awake. Verbally responsive. Resp even and unlabored. ABC's intact. NAD noted.

## 2014-03-11 NOTE — ED Notes (Signed)
Awake. A/O x4. Verbally responsive. Resp even and unlabored. ABC's intact. MD at bedside. Pt reported that she has difficulty walking and she will not leave until she can walk normally. Pt stated that she received Dilaudid IV the last visit and was able to ambulated afterward. MD informed pt that she needs to f/u with orthopedic specialist.

## 2014-03-11 NOTE — ED Notes (Signed)
Awake. Verbally responsive. Resp even and unlabored. ABC's intact. Pt reported having pain with movement.

## 2014-03-11 NOTE — ED Notes (Signed)
Per ems pt is from home, walking with a walker when ems arrived. Pt reports recent shoulder surgery that triggered left leg pain, pt reports hx of pinched nerve in back. Pt seen recently for same c/o. Pain 10/10.

## 2014-03-11 NOTE — ED Provider Notes (Signed)
CSN: [id number]     Arrival date & time 03/11/14  1534 History   First MD Initiated Contact with Patient 03/11/14 1659     Chief Complaint  Patient presents with  . Leg Pain   Norma [name] is a 53 y.o. female with a history of sciatica who presents the ED complaining of continued left sciatica pain. The patient reports she has a history of left sciatica pain that has been worse since 02/20/2014. Patient complains of pain starting in her left low back that radiates down her buttocks and into her left posterior leg into her left foot. She complains of some intermittent paresthesias in her left lower extremity. She reports she is getting around at home with a walker. The patient was seen here on 03/07/2014 for the same left sciatica pain. She reports the pain has not changed since her last visit she just has continued pain. She rates pain a 10 out of 10 she reports it's worse with sitting and walking. The patient reports taking one oxycodone and 2 Percocet at 9 AM today, with mild relief. Patient denies fevers, chills, nausea, vomiting, loss of bowel or bladder control. She denies history of IV drug use or cancer. Patient reports she has an appointment with orthopedic surgeon Dr. Berenice Primas tomorrow at 11:45 AM for her sciatica.   (Consider location/radiation/quality/duration/timing/severity/associated sxs/prior Treatment) HPI  Past Medical History  Diagnosis Date  . Hypertension   . Acid reflux   . Tachyarrhythmia   . Left shoulder pain   . Back pain   . Trigger point of thoracic region 03/22/2012  . Irregular heart beat    Past Surgical History  Procedure Laterality Date  . Ankle surgery    . Rotator cuff repair Left 02/20/14   Family History  Problem Relation Age of Onset  . Diabetes Brother   . Diabetes Maternal Aunt   . Diabetes Paternal Aunt   . Diabetes Maternal Grandmother   . Diabetes Maternal Grandfather   . Diabetes Paternal Grandmother   . Diabetes Paternal Grandfather     History  Substance Use Topics  . Smoking status: Never Smoker   . Smokeless tobacco: Never Used  . Alcohol Use: Yes     Comment: occasionally   OB History    No data available     Review of Systems  Constitutional: Negative for fever and chills.  HENT: Negative for congestion, sore throat and trouble swallowing.   Eyes: Negative for pain, redness and visual disturbance.  Respiratory: Negative for cough, shortness of breath and wheezing.   Cardiovascular: Negative for chest pain and palpitations.  Gastrointestinal: Negative for nausea, vomiting, abdominal pain and diarrhea.  Genitourinary: Negative for dysuria, hematuria, decreased urine volume and difficulty urinating.  Musculoskeletal: Positive for back pain. Negative for neck pain and neck stiffness.  Skin: Negative for pallor, rash and wound.  Neurological: Negative for dizziness, light-headedness and headaches.  All other systems reviewed and are negative.     Allergies  Soma; Nortriptyline; and Prednisone  Home Medications   Prior to Admission medications   Medication Sig Start Date End Date Taking? Authorizing Provider  acetaminophen (TYLENOL) 650 MG CR tablet Take 1 tablet (650 mg total) by mouth every 8 (eight) hours. 03/12/13  Yes Dayarmys Piloto de Gwendalyn Ege, MD  oxyCODONE (ROXICODONE) 5 MG immediate release tablet Take 1 tablet (5 mg total) by mouth every 4 (four) hours as needed for breakthrough pain. 03/07/14  Yes Pamella Pert, MD  oxyCODONE-acetaminophen (PERCOCET/ROXICET) 5-325 MG  per tablet Take 2 tablets by mouth every 4 (four) hours as needed for severe pain.   Yes Historical Provider, MD  ARIPiprazole (ABILIFY) 2 MG tablet Take 2 mg by mouth daily.    Historical Provider, MD  beta carotene w/minerals (OCUVITE) tablet Take 1 tablet by mouth daily.    Historical Provider, MD  Blood Glucose Monitoring Suppl (BLOOD GLUCOSE METER KIT AND SUPPLIES) Dispense based on patient and insurance preference. Use up to  four times daily as directed. (FOR ICD-9 250.00, 250.01). 01/14/14   [name], MD  buPROPion (WELLBUTRIN XL) 300 MG 24 hr tablet Take 300 mg by mouth daily.    Historical Provider, MD  citalopram (CELEXA) 40 MG tablet Take 1 tablet (40 mg total) by mouth daily. (mental health) 09/04/13   [name], MD  cyclobenzaprine (FLEXERIL) 10 MG tablet Take 1 tablet (10 mg total) by mouth 3 (three) times daily as needed for muscle spasms. 08/20/13   [name], MD  diazepam (VALIUM) 5 MG tablet Take 1 hour before procedure 11/29/13   Dickie La, MD  diclofenac (VOLTAREN) 75 MG EC tablet Take 1 tablet (75 mg total) by mouth 2 (two) times daily. 11/08/13   Rosemarie Ax, MD  dicyclomine (BENTYL) 20 MG tablet Take 1 tablet (20 mg total) by mouth 2 (two) times daily. 05/24/13   Marin Olp, MD  diltiazem (CARDIZEM SR) 60 MG 12 hr capsule Take 1 capsule (60 mg total) by mouth 2 (two) times daily. 05/08/13   Josalyn C Funches, MD  EPINEPHrine (EPIPEN) 0.3 mg/0.3 mL SOAJ Inject 0.3 mLs (0.3 mg total) into the muscle once. 11/02/12   [name], MD  fish oil-omega-3 fatty acids 1000 MG capsule Take 2 g by mouth daily.    Historical Provider, MD  gabapentin (NEURONTIN) 300 MG capsule Take 3 capsules (900 mg total) by mouth 3 (three) times daily. 11/21/13   Tamela Oddi Hess, DO  hydrochlorothiazide (HYDRODIURIL) 25 MG tablet Take 1 tablet (25 mg total) by mouth daily. 07/05/12   Minerva Ends, MD  hydrOXYzine (ATARAX/VISTARIL) 10 MG tablet Take 10 mg by mouth 3 (three) times daily as needed for itching.    Historical Provider, MD  ibuprofen (ADVIL,MOTRIN) 800 MG tablet take 1 tablet by mouth every 8 hours if needed 02/26/14   [name], MD  influenza vac recombinant HA trivalent (FLUBLOK) injection Inject 0.5 mLs into the muscle once.    Historical Provider, MD  isosorbide mononitrate (IMDUR) 30 MG 24 hr tablet Take 30 mg by mouth daily.    Historical Provider, MD  metFORMIN (GLUCOPHAGE) 500  MG tablet Take 1 tablet (500 mg total) by mouth 2 (two) times daily with a meal. 05/07/13   Josalyn C Funches, MD  methocarbamol (ROBAXIN) 500 MG tablet Take 500 mg by mouth daily.    Historical Provider, MD  metoprolol tartrate (LOPRESSOR) 25 MG tablet Take 1 tablet (25 mg total) by mouth 2 (two) times daily. 01/14/14   [name], MD  milk thistle 175 MG tablet Take 350 mg by mouth daily.    Historical Provider, MD  Multiple Vitamin (MULTIVITAMIN WITH MINERALS) TABS tablet Take 1 tablet by mouth daily.    Historical Provider, MD  omeprazole (PRILOSEC) 20 MG capsule take 1 capsule by mouth once daily    [name], MD  polyethylene glycol (MIRALAX / GLYCOLAX) packet Take 17 g by mouth daily as needed (CONSTIPATION). 05/24/13   Annie Main  Kristian Covey, MD  potassium chloride (K-DUR) 10 MEQ tablet Take 10 mEq by mouth 2 (two) times daily.    Historical Provider, MD  SUMAtriptan (IMITREX) 50 MG tablet Take 1 tablet (50 mg total) by mouth every 2 (two) hours as needed for migraine or headache. Do not exceed 200 mg daily. 06/20/13   Coral Spikes, DO  traMADol (ULTRAM) 50 MG tablet Take 1 tablet (50 mg total) by mouth every 6 (six) hours as needed. 11/21/13   Nolon Rod, DO  traZODone (DESYREL) 100 MG tablet Take 2 tablets (200 mg total) by mouth at bedtime. 05/07/13   Josalyn C Funches, MD  triamcinolone cream (KENALOG) 0.1 % Apply 1 application topically 2 (two) times daily. 01/14/14   [name], MD   BP 142/79 mmHg  Pulse 97  Temp(Src) 97.9 F (36.6 C) (Oral)  Resp 18  SpO2 100% Physical Exam  Constitutional: She appears well-developed and well-nourished. No distress.  HENT:  Head: Normocephalic and atraumatic.  Right Ear: External ear normal.  Left Ear: External ear normal.  Nose: Nose normal.  Mouth/Throat: Oropharynx is clear and moist. No oropharyngeal exudate.  Eyes: Conjunctivae are normal. Pupils are equal, round, and reactive to light. Right eye exhibits no discharge. Left eye  exhibits no discharge.  Neck: Neck supple.  Cardiovascular: Normal rate, regular rhythm, normal heart sounds and intact distal pulses.  Exam reveals no gallop and no friction rub.   No murmur heard. Pulmonary/Chest: Effort normal and breath sounds normal. No respiratory distress. She has no wheezes. She has no rales.  Abdominal: Soft. There is no tenderness.  Musculoskeletal: Normal range of motion. She exhibits no edema or tenderness.  Patient is spontaneously moving all extremities in a coordinated fashion exhibiting good strength. Patient has 5 out of 5 strength in her bilateral upper and lower extremities. Bilateral patellar DTRs are intact. Patient is able to ambulate with pain. No bony point tenderness. No lower extremity edema. Bilateral posterior tibialis pulses intact.  Lymphadenopathy:    She has no cervical adenopathy.  Neurological: She is alert. She has normal reflexes. She displays normal reflexes. She exhibits normal muscle tone. Coordination normal.  Skin: Skin is warm and dry. No rash noted. She is not diaphoretic. No erythema. No pallor.  Sutures are noted to the anterior aspect of her left shoulder without edema, erythema or induration.   Psychiatric: She has a normal mood and affect. Her behavior is normal.  Nursing note and vitals reviewed.   ED Course  Procedures (including critical care time) Labs Review Labs Reviewed - No data to display  Imaging Review No results found.   EKG Interpretation None      Filed Vitals:   03/11/14 2030 03/11/14 2100 03/11/14 2200 03/11/14 2228  BP: 160/73 162/95 118/74 142/79  Pulse: 75 72 69 97  Temp:      TempSrc:      Resp:    18  SpO2: 99% 100% 95% 100%     MDM   Meds given in ED:  Medications  ketorolac (TORADOL) injection 30 mg (30 mg Intramuscular Given 03/11/14 1753)  diazepam (VALIUM) tablet 5 mg (5 mg Oral Given 03/11/14 1752)  oxyCODONE-acetaminophen (PERCOCET/ROXICET) 5-325 MG per tablet 1 tablet (1  tablet Oral Given 03/11/14 2003)  HYDROmorphone (DILAUDID) injection 1 mg (1 mg Intramuscular Given 03/11/14 2215)    New Prescriptions   No medications on file    Final diagnoses:  Sciatica, left   Norma [name]  is a 53 y.o. female with a history of sciatica who presents the ED complaining of continued left sciatica pain. Patient is here on 03/07/2014. She reports she is still having pain. The pain has not changed. The patient is able to ambulate with pain. The patient is afebrile and non-toxic appearing. The patient is neurovascularly intact. She denies loss of bowel or bladder control. She denies history of IV drug use or cancer. The patient is non-toxic appearing.  19:30 Patient reports her pain has improved but she still having significant pain when walking to the bathroom and feels like she cannot walk. Not willing to go home at this time. Will give her one oxycodone and reevaluate. 21:00 patient still having pain after Percocet. Patient will be evaluated by Dr. Doy Mince soon. 21:45 patient having continued pain. After discussing with Dr. Doy Mince will give 1 mg Dilaudid and evaluate again.  10:30 PM patient reports her pain is much improved after Dilaudid. She feels comfortable going home. I advised her to keep her follow-up appointment with Dr. Berenice Primas the orthopedic surgeon for tomorrow. Advised patient return to the ED with new or worsening symptoms or new concerns. Patient verbalized understanding and agreement with plan.  The patient was discussed with and evaluated by Dr. Doy Mince who agrees with assessment and plan.     Hanley Hays, PA-C 03/11/14 2231  Artis Delay, MD 03/12/14 [phone]

## 2014-03-12 NOTE — ED Provider Notes (Signed)
Medical screening examination/treatment/procedure(s) were conducted as a shared visit with non-physician practitioner(s) and myself.  I personally evaluated the patient during the encounter.   EKG Interpretation None      53 year old female presenting with left low back pain, radiating down her left leg. She has a history of sciatica and feels that this pain is consistent with that.  On exam, well appearing, nontoxic, not distressed, normal respiratory effort, normal perfusion, TTP of left low back and gluteus, positive straight leg raise test on the left, normal bilateral lower extremity sensation and strength.  Plan IV pain control and follow-up with orthopedist in the morning.  Clinical Impression: 1. Sciatica, left       Artis Delay, MD 03/12/14 2253

## 2014-03-19 ENCOUNTER — Ambulatory Visit (INDEPENDENT_AMBULATORY_CARE_PROVIDER_SITE_OTHER): Payer: Medicaid Other | Admitting: Family Medicine

## 2014-03-19 ENCOUNTER — Encounter: Payer: Self-pay | Admitting: Family Medicine

## 2014-03-19 ENCOUNTER — Other Ambulatory Visit: Payer: Self-pay | Admitting: Orthopedic Surgery

## 2014-03-19 VITALS — BP 148/89 | HR 84 | Temp 98.3°F | Resp 18 | Wt 252.0 lb

## 2014-03-19 DIAGNOSIS — M545 Low back pain: Secondary | ICD-10-CM

## 2014-03-19 DIAGNOSIS — G894 Chronic pain syndrome: Secondary | ICD-10-CM

## 2014-03-19 DIAGNOSIS — I10 Essential (primary) hypertension: Secondary | ICD-10-CM

## 2014-03-19 DIAGNOSIS — M79604 Pain in right leg: Secondary | ICD-10-CM

## 2014-03-19 MED ORDER — HYDROCHLOROTHIAZIDE 25 MG PO TABS: 25.0000 mg | ORAL_TABLET | Freq: Every day | ORAL | Status: DC

## 2014-03-19 MED ORDER — METFORMIN HCL 500 MG PO TABS: 500.0000 mg | ORAL_TABLET | Freq: Two times a day (BID) | ORAL | Status: DC

## 2014-03-19 MED ORDER — TRAZODONE HCL 100 MG PO TABS
200.0000 mg | ORAL_TABLET | Freq: Every day | ORAL | Status: DC
Start: 2014-03-19 — End: 2014-09-17

## 2014-03-19 MED ORDER — PREGABALIN 75 MG PO CAPS: 75.0000 mg | ORAL_CAPSULE | Freq: Two times a day (BID) | ORAL | Status: DC

## 2014-03-19 MED ORDER — CYCLOBENZAPRINE HCL 10 MG PO TABS: 10.0000 mg | ORAL_TABLET | Freq: Three times a day (TID) | ORAL | Status: DC | PRN

## 2014-03-19 MED ORDER — DICLOFENAC SODIUM 1 % TD GEL: 2.0000 g | Freq: Four times a day (QID) | TRANSDERMAL | Status: DC

## 2014-03-19 NOTE — Assessment & Plan Note (Signed)
Chronic in nature Changes with her mood Start lyrica, flexeril and topical voltaren gel Avoid prolonged use of tylenol or NSAID Offered tramadol but this doesn't help per report Needs to f/up with Dr. Berenice Primas for shoulder

## 2014-03-19 NOTE — Patient Instructions (Signed)
It was great to see you today!  I am sorry that you are not feeling well  Please start both lyrica and flexeril as well as topical gel  Please return to clinic if symptoms do not improve or worsen Feel better soon Happy holidays! [name], MD

## 2014-03-19 NOTE — Progress Notes (Signed)
Patient ID: Norma [name], female   DOB: July 11, 1960, 53 y.o.   MRN: [id number]   [facility] [name], MD Phone: [phone]  Subjective:  Ms Norma [name] is a 53 y.o F who presents for pain (hand and foot) Was recently seen in the ED for sciatica. Last given 20tabs of 38m oxy. Had persistent pain on her shoulder after surgery. Has been trying to get into office but was unable. Chronic pains every where. Cannot specify joint. Reports crawling on floor to door to call for help.  All relevant systems were reviewed and were negative unless otherwise noted in the HPI  Past Medical History Reviewed problem list.  Medications- reviewed and updated Current Outpatient Prescriptions  Medication Sig Dispense Refill  . acetaminophen (TYLENOL) 650 MG CR tablet Take 1 tablet (650 mg total) by mouth every 8 (eight) hours. 30 tablet 3  . ARIPiprazole (ABILIFY) 2 MG tablet Take 2 mg by mouth daily.    . beta carotene w/minerals (OCUVITE) tablet Take 1 tablet by mouth daily.    . Blood Glucose Monitoring Suppl (BLOOD GLUCOSE METER KIT AND SUPPLIES) Dispense based on patient and insurance preference. Use up to four times daily as directed. (FOR ICD-9 250.00, 250.01). 1 each 0  . buPROPion (WELLBUTRIN XL) 300 MG 24 hr tablet Take 300 mg by mouth daily.    . citalopram (CELEXA) 40 MG tablet Take 1 tablet (40 mg total) by mouth daily. (mental health) 90 tablet 2  . cyclobenzaprine (FLEXERIL) 10 MG tablet Take 1 tablet (10 mg total) by mouth 3 (three) times daily as needed for muscle spasms. 90 tablet 1  . diazepam (VALIUM) 5 MG tablet Take 1 hour before procedure 2 tablet 0  . diclofenac (VOLTAREN) 75 MG EC tablet Take 1 tablet (75 mg total) by mouth 2 (two) times daily. 28 tablet 0  . dicyclomine (BENTYL) 20 MG tablet Take 1 tablet (20 mg total) by mouth 2 (two) times daily. 20 tablet 0  . diltiazem (CARDIZEM SR) 60 MG 12 hr capsule Take 1 capsule (60 mg total) by mouth 2 (two) times  daily. 180 capsule 3  . EPINEPHrine (EPIPEN) 0.3 mg/0.3 mL SOAJ Inject 0.3 mLs (0.3 mg total) into the muscle once. 1 Device 1  . fish oil-omega-3 fatty acids 1000 MG capsule Take 2 g by mouth daily.    .Marland Kitchengabapentin (NEURONTIN) 300 MG capsule Take 3 capsules (900 mg total) by mouth 3 (three) times daily. 180 capsule 2  . hydrochlorothiazide (HYDRODIURIL) 25 MG tablet Take 1 tablet (25 mg total) by mouth daily. 90 tablet 3  . hydrOXYzine (ATARAX/VISTARIL) 10 MG tablet Take 10 mg by mouth 3 (three) times daily as needed for itching.    .Marland Kitchenibuprofen (ADVIL,MOTRIN) 800 MG tablet take 1 tablet by mouth every 8 hours if needed 15 tablet 0  . influenza vac recombinant HA trivalent (FLUBLOK) injection Inject 0.5 mLs into the muscle once.    . isosorbide mononitrate (IMDUR) 30 MG 24 hr tablet Take 30 mg by mouth daily.    . metFORMIN (GLUCOPHAGE) 500 MG tablet Take 1 tablet (500 mg total) by mouth 2 (two) times daily with a meal. 180 tablet 0  . methocarbamol (ROBAXIN) 500 MG tablet Take 500 mg by mouth daily.    . metoprolol tartrate (LOPRESSOR) 25 MG tablet Take 1 tablet (25 mg total) by mouth 2 (two) times daily. 180 tablet 3  . milk thistle 175 MG tablet Take 350 mg by  mouth daily.    . Multiple Vitamin (MULTIVITAMIN WITH MINERALS) TABS tablet Take 1 tablet by mouth daily.    Marland Kitchen omeprazole (PRILOSEC) 20 MG capsule take 1 capsule by mouth once daily 30 capsule 3  . oxyCODONE (ROXICODONE) 5 MG immediate release tablet Take 1 tablet (5 mg total) by mouth every 4 (four) hours as needed for breakthrough pain. 20 tablet 0  . oxyCODONE-acetaminophen (PERCOCET/ROXICET) 5-325 MG per tablet Take 2 tablets by mouth every 4 (four) hours as needed for severe pain.    . polyethylene glycol (MIRALAX / GLYCOLAX) packet Take 17 g by mouth daily as needed (CONSTIPATION). 14 each 1  . potassium chloride (K-DUR) 10 MEQ tablet Take 10 mEq by mouth 2 (two) times daily.    . SUMAtriptan (IMITREX) 50 MG tablet Take 1 tablet (50  mg total) by mouth every 2 (two) hours as needed for migraine or headache. Do not exceed 200 mg daily. 10 tablet 0  . traMADol (ULTRAM) 50 MG tablet Take 1 tablet (50 mg total) by mouth every 6 (six) hours as needed. 60 tablet 0  . traZODone (DESYREL) 100 MG tablet Take 2 tablets (200 mg total) by mouth at bedtime. 60 tablet 3  . triamcinolone cream (KENALOG) 0.1 % Apply 1 application topically 2 (two) times daily. 30 g 0   No current facility-administered medications for this visit.   Chief complaint-noted No additions to family history Social history- patient is a prior smoker  Objective: BP 148/89 mmHg  Pulse 84  Temp(Src) 98.3 F (36.8 C) (Oral)  Resp 18  Wt 252 lb (114.306 kg)  SpO2 97% Gen: NAD, alert, cooperative with exam Neuro: Alert and oriented, No gross deficits Skin: no rashes no lesions  Assessment/Plan: See problem based a/p

## 2014-03-22 ENCOUNTER — Telehealth: Payer: Self-pay | Admitting: Family Medicine

## 2014-03-22 DIAGNOSIS — G894 Chronic pain syndrome: Secondary | ICD-10-CM

## 2014-03-22 NOTE — Telephone Encounter (Signed)
Pt called because her ortho doctor said that they can not do anything more for her and would like her to go to a pain clinic. They would like [place] to do a referral to the pain clinic for the patient. jw

## 2014-03-24 NOTE — Telephone Encounter (Signed)
Will place but it was my understanding that she already had referral in place and did not like the pain clinic she had been assigned to or something like this. [facility], MD

## 2014-03-26 ENCOUNTER — Telehealth: Payer: Self-pay | Admitting: Family Medicine

## 2014-03-26 DIAGNOSIS — G894 Chronic pain syndrome: Secondary | ICD-10-CM

## 2014-03-26 NOTE — Telephone Encounter (Signed)
Pt called abd said the the doctor was suppose to call in a refill on her Voltaren. Can we do this. jw

## 2014-03-27 ENCOUNTER — Ambulatory Visit
Admission: RE | Admit: 2014-03-27 | Discharge: 2014-03-27 | Disposition: A | Discharge status: Home / Self Care | Payer: Medicaid Other | Source: Ambulatory Visit | Attending: Orthopedic Surgery | Admitting: Orthopedic Surgery

## 2014-03-27 DIAGNOSIS — M79604 Pain in right leg: Secondary | ICD-10-CM

## 2014-03-27 DIAGNOSIS — M545 Low back pain: Secondary | ICD-10-CM

## 2014-03-29 MED ORDER — DICLOFENAC SODIUM 1 % TD GEL: 2.0000 g | Freq: Four times a day (QID) | TRANSDERMAL | Status: DC

## 2014-03-29 NOTE — Telephone Encounter (Signed)
Pt does not use [facility] med[facility] (this is where the Rx was sent) I have changed the pharmacy and resent the med. Driana Dazey, Salome Spotted

## 2014-03-29 NOTE — Telephone Encounter (Signed)
I did reorder the gel? Is she talking about something oral... [facility], MD

## 2014-04-07 ENCOUNTER — Other Ambulatory Visit: Payer: Self-pay | Admitting: Family Medicine

## 2014-04-18 ENCOUNTER — Telehealth: Payer: Self-pay | Admitting: Family Medicine

## 2014-04-18 NOTE — Telephone Encounter (Signed)
Paged to [facility] Emergency Line around [phone], returned page and spoke to patient, Norma [name], who reported that she was having persistent worsening back and shoulder pain with some sciatica involvement and radiations down her legs. Last [facility] office visit 03/19/14 for same complaint, additionally seen in ED recently 11/19 and 11/21. She states that due to pain she is currently having difficulty walking, and there is no one else at home. She "does not know what else to do about the pain" and states that she is taking Lyrica, Flexeril, topical Voltaren without relief. States she was previously seen by [facility] for her shoulder and back, recently reportedly had epidural spinal injections about 1 month ago. Recently a referral was placed to Pain Management by her PCP on 03/22/14, but the patient states that they are unable to see her until March 2016.  I advised her that my options are limited via the phone at this time, and am unable to call in any pain medicine for her acute symptoms. Additionally, since our clinic is closed she is unable to follow-up with [place] sooner than 04/22/14. She understood, and stated that since there was no one to help her at home, she would plan on calling EMS to go to ER to seek treatment for her pain. Red flag symptoms given for worsening low back pain, and advised her to return call if needed.  Nobie Putnam, [place], PGY-2

## 2014-04-20 ENCOUNTER — Encounter (HOSPITAL_COMMUNITY): Payer: Self-pay | Admitting: *Deleted

## 2014-04-20 ENCOUNTER — Emergency Department (HOSPITAL_COMMUNITY): Payer: Medicaid Other

## 2014-04-20 ENCOUNTER — Observation Stay (HOSPITAL_COMMUNITY)
Admission: EM | Admit: 2014-04-20 | Discharge: 2014-04-22 | Disposition: A | Discharge status: Home / Self Care | Payer: Medicaid Other | Attending: Family Medicine | Admitting: Family Medicine

## 2014-04-20 ENCOUNTER — Telehealth: Payer: Self-pay | Admitting: Family Medicine

## 2014-04-20 DIAGNOSIS — M4806 Spinal stenosis, lumbar region: Secondary | ICD-10-CM | POA: Diagnosis not present

## 2014-04-20 DIAGNOSIS — K219 Gastro-esophageal reflux disease without esophagitis: Secondary | ICD-10-CM | POA: Diagnosis not present

## 2014-04-20 DIAGNOSIS — I10 Essential (primary) hypertension: Secondary | ICD-10-CM | POA: Insufficient documentation

## 2014-04-20 DIAGNOSIS — M25552 Pain in left hip: Principal | ICD-10-CM | POA: Diagnosis present

## 2014-04-20 DIAGNOSIS — G894 Chronic pain syndrome: Secondary | ICD-10-CM

## 2014-04-20 DIAGNOSIS — F329 Major depressive disorder, single episode, unspecified: Secondary | ICD-10-CM | POA: Diagnosis not present

## 2014-04-20 DIAGNOSIS — M25559 Pain in unspecified hip: Secondary | ICD-10-CM | POA: Insufficient documentation

## 2014-04-20 DIAGNOSIS — F32A Depression, unspecified: Secondary | ICD-10-CM | POA: Insufficient documentation

## 2014-04-20 LAB — CBC
HEMATOCRIT: 39.9 % (ref 36.0–46.0)
Hemoglobin: 12.7 g/dL (ref 12.0–15.0)
MCH: 27.5 pg (ref 26.0–34.0)
MCHC: 31.8 g/dL (ref 30.0–36.0)
MCV: 86.4 fL (ref 78.0–100.0)
Platelets: 189 10*3/uL (ref 150–400)
RBC: 4.62 MIL/uL (ref 3.87–5.11)
RDW: 15.1 % (ref 11.5–15.5)
WBC: 7.6 10*3/uL (ref 4.0–10.5)

## 2014-04-20 LAB — BASIC METABOLIC PANEL
Anion gap: 8 (ref 5–15)
BUN: 13 mg/dL (ref 6–23)
CHLORIDE: 105 meq/L (ref 96–112)
CO2: 25 mmol/L (ref 19–32)
CREATININE: 0.74 mg/dL (ref 0.50–1.10)
Calcium: 8.7 mg/dL (ref 8.4–10.5)
GFR calc non Af Amer: >90 mL/min (ref >90)
Glucose, Bld: 101 mg/dL — ABNORMAL HIGH (ref 70–99)
Potassium: 3.5 mmol/L (ref 3.5–5.1)
Sodium: 138 mmol/L (ref 135–145)

## 2014-04-20 MED ORDER — SODIUM CHLORIDE 0.9 % IJ SOLN
3.0000 mL | Freq: Two times a day (BID) | INTRAMUSCULAR | Status: DC
  Administered 2014-04-20 – 2014-04-22 (×4): 3 mL via INTRAVENOUS

## 2014-04-20 MED ORDER — FENTANYL CITRATE 0.05 MG/ML IJ SOLN
50.0000 ug | Freq: Once | INTRAMUSCULAR | Status: AC
  Administered 2014-04-20: 50 ug via INTRAVENOUS
  Filled 2014-04-20: qty 2

## 2014-04-20 MED ORDER — PANTOPRAZOLE SODIUM 40 MG PO TBEC
40.0000 mg | DELAYED_RELEASE_TABLET | Freq: Every day | ORAL | Status: DC
  Administered 2014-04-20 – 2014-04-22 (×3): 40 mg via ORAL
  Filled 2014-04-20 (×3): qty 1

## 2014-04-20 MED ORDER — HYDROMORPHONE HCL 1 MG/ML IJ SOLN
INTRAMUSCULAR | Status: AC
  Filled 2014-04-20: qty 1

## 2014-04-20 MED ORDER — FENTANYL CITRATE 0.05 MG/ML IJ SOLN
50.0000 ug | Freq: Once | INTRAMUSCULAR | Status: AC
  Administered 2014-04-20: 50 ug via INTRAVENOUS

## 2014-04-20 MED ORDER — ACETAMINOPHEN 325 MG PO TABS
650.0000 mg | ORAL_TABLET | Freq: Three times a day (TID) | ORAL | Status: DC | PRN
  Administered 2014-04-21: 650 mg via ORAL
  Filled 2014-04-20: qty 2

## 2014-04-20 MED ORDER — GABAPENTIN 300 MG PO CAPS
900.0000 mg | ORAL_CAPSULE | Freq: Three times a day (TID) | ORAL | Status: DC
  Administered 2014-04-20 – 2014-04-22 (×6): 900 mg via ORAL
  Filled 2014-04-20 (×7): qty 3

## 2014-04-20 MED ORDER — BUPROPION HCL ER (XL) 300 MG PO TB24
300.0000 mg | ORAL_TABLET | Freq: Every day | ORAL | Status: DC
  Administered 2014-04-21 – 2014-04-22 (×2): 300 mg via ORAL
  Filled 2014-04-20 (×2): qty 1

## 2014-04-20 MED ORDER — SODIUM CHLORIDE 0.9 % IV SOLN: 250.0000 mL | INTRAVENOUS | Status: DC | PRN

## 2014-04-20 MED ORDER — CYCLOBENZAPRINE HCL 10 MG PO TABS
10.0000 mg | ORAL_TABLET | Freq: Three times a day (TID) | ORAL | Status: DC | PRN
  Administered 2014-04-21 – 2014-04-22 (×5): 10 mg via ORAL
  Filled 2014-04-20 (×5): qty 1

## 2014-04-20 MED ORDER — HYDROXYZINE HCL 10 MG PO TABS
10.0000 mg | ORAL_TABLET | Freq: Three times a day (TID) | ORAL | Status: DC | PRN
  Administered 2014-04-20 – 2014-04-21 (×2): 10 mg via ORAL
  Filled 2014-04-20 (×3): qty 1

## 2014-04-20 MED ORDER — HYDROMORPHONE HCL 1 MG/ML IJ SOLN
1.0000 mg | Freq: Once | INTRAMUSCULAR | Status: AC
  Administered 2014-04-20: 1 mg via INTRAVENOUS
  Filled 2014-04-20: qty 1

## 2014-04-20 MED ORDER — ARIPIPRAZOLE 2 MG PO TABS
2.0000 mg | ORAL_TABLET | Freq: Every day | ORAL | Status: DC
  Administered 2014-04-20 – 2014-04-22 (×3): 2 mg via ORAL
  Filled 2014-04-20 (×3): qty 1

## 2014-04-20 MED ORDER — ACETAMINOPHEN ER 650 MG PO TBCR: 650.0000 mg | EXTENDED_RELEASE_TABLET | Freq: Three times a day (TID) | ORAL | Status: DC | PRN

## 2014-04-20 MED ORDER — HEPARIN SODIUM (PORCINE) 5000 UNIT/ML IJ SOLN
5000.0000 [IU] | Freq: Three times a day (TID) | INTRAMUSCULAR | Status: DC
  Administered 2014-04-20 – 2014-04-22 (×5): 5000 [IU] via SUBCUTANEOUS
  Filled 2014-04-20 (×8): qty 1

## 2014-04-20 MED ORDER — HYDROCHLOROTHIAZIDE 25 MG PO TABS
25.0000 mg | ORAL_TABLET | Freq: Every day | ORAL | Status: DC
  Administered 2014-04-22: 25 mg via ORAL
  Filled 2014-04-20 (×3): qty 1

## 2014-04-20 MED ORDER — ISOSORBIDE MONONITRATE ER 30 MG PO TB24
30.0000 mg | ORAL_TABLET | Freq: Every day | ORAL | Status: DC
  Administered 2014-04-20 – 2014-04-22 (×3): 30 mg via ORAL
  Filled 2014-04-20 (×3): qty 1

## 2014-04-20 MED ORDER — TRAZODONE HCL 100 MG PO TABS
200.0000 mg | ORAL_TABLET | Freq: Every day | ORAL | Status: DC
  Administered 2014-04-20 – 2014-04-21 (×2): 200 mg via ORAL
  Filled 2014-04-20 (×3): qty 2

## 2014-04-20 MED ORDER — METOPROLOL TARTRATE 25 MG PO TABS
25.0000 mg | ORAL_TABLET | Freq: Two times a day (BID) | ORAL | Status: DC
  Administered 2014-04-20 – 2014-04-22 (×4): 25 mg via ORAL
  Filled 2014-04-20 (×5): qty 1

## 2014-04-20 MED ORDER — DILTIAZEM HCL ER 60 MG PO CP12
60.0000 mg | ORAL_CAPSULE | Freq: Two times a day (BID) | ORAL | Status: DC
  Administered 2014-04-21 – 2014-04-22 (×4): 60 mg via ORAL
  Filled 2014-04-20 (×6): qty 1

## 2014-04-20 MED ORDER — SODIUM CHLORIDE 0.9 % IJ SOLN: 3.0000 mL | INTRAMUSCULAR | Status: DC | PRN

## 2014-04-20 MED ORDER — CYCLOBENZAPRINE HCL 10 MG PO TABS
10.0000 mg | ORAL_TABLET | Freq: Once | ORAL | Status: AC
  Administered 2014-04-20: 10 mg via ORAL
  Filled 2014-04-20: qty 1

## 2014-04-20 MED ORDER — HYDROMORPHONE HCL 1 MG/ML IJ SOLN
1.0000 mg | INTRAMUSCULAR | Status: DC | PRN
  Administered 2014-04-20 – 2014-04-21 (×5): 1 mg via INTRAVENOUS
  Filled 2014-04-20 (×4): qty 1

## 2014-04-20 MED ORDER — ADULT MULTIVITAMIN W/MINERALS CH
1.0000 | ORAL_TABLET | Freq: Every day | ORAL | Status: DC
  Administered 2014-04-20 – 2014-04-22 (×3): 1 via ORAL
  Filled 2014-04-20 (×3): qty 1

## 2014-04-20 NOTE — H&P (Signed)
[facility] Admission History and Physical Service Pager: [phone]  Patient name: Norma [name] Medical record number: [id number] Date of birth: [birth year] Age: 54 y.o. Gender: female  Primary Care Provider: Langston Masker, MD Consultants: none Code Status: full code (discussed with pt upon admission)   Chief Complaint: Left hip pain  Assessment and Plan: Norma [name] is a 54 y.o. female presenting with left hip pain. PMH is significant for spinal stenosis, chronic pain, GERD, depression, hypertension.  # Left hip pain: Likely multifactorial. Hip x-ray and CT pelvis without any acute bony findings to explain acute pain. MRI of lumbar spine from earlier in December 2015 shows multilevel degenerative changes with spinal stenosis and foraminal encroachment. Patient is followed by Dr. Berenice Primas as an outpatient for a variety of musculoskeletal complaints. She is working on getting in with a pain clinic, but will not be able to be seen there until the end of March. I suspect she has radicular nerve pain combined with bursitis. She has tenderness over the greater trochanter, suggesting possible greater trochanteric bursitis. Otherwise, I'm not able to get a good exam to thoroughly evaluate what else could be going on, as patient complains of pain was basically any movement of her hip. She has no bowel, bladder dysfunction, denies fevers, also denies crutch numbness, so concern for cauda equina syndrome is low. Patient had to be admitted as she is unable to walk. -Admit to MedSurg unit for observation -PT and OT consult -Dilaudid 1 mg q4hprn severe pain tonight. Discussed with patient that we will rapidly convert her to an oral pain regimen tomorrow. -Continue flexeril prn, also scheduled gabapentin. Tylenol for less severe pain. -Will likely plan for bedside injection of greater trochanteric bursa tomorrow. -Consider additional spinal imaging if no improvement -Check ESR,  CRP to eval for more serious process like synovitis -Consider orthopedic consult  # Hypertension: normotensive currently - continue home regimen: HCTZ, Imdur, metoprolol, Cardizem  # Depression: Likely contributing to much of her chronic pain -Continue home Abilify, Wellbutrin, trazodone  # GERD: Continue Protonix  FEN/GI: SLIV, HH diet Prophylaxis: SQ heparin  Disposition: observation status, dispo pending clinical improvement & PT eval  History of Present Illness: Norma [name] is a 54 y.o. female presenting with left hip pain.  Patient has a history of multiple chronic pain complaints. She has had left hip pain for some time, however Tuesday of this week it acutely worsened. There was no inciting event. She reports having a sharp pain shooting down her leg. Has also had some numbness on the back of her leg. She's been unable to walk, and has been basically crawling on the floor to get around, or else she has to lay in bed. The inability to walk is due to pain, not weakness according to patient. She's tried naproxen which has helped some. Flexeril has not helped. She's been on gabapentin for about 2 years and states this helps. She has not however, taken this since Tuesday for some unknown reason. She has pain whenever she lays on her left side. She does report having a hip injection in the past, at least one year ago. She reports a history of having fractured her pelvis in the distant past. No fevers. Normal by mouth intake. Normally urinary and bowel function. She does report decreased sensation in the left leg compared to the right, but is still able to feel.  She is followed as an outpatient by Dr. Berenice Primas of orthopedic surgery. Had  an MRI done of lumbar spine in December 2015, which showed multilevel degenerative changes causing spinal stenosis and foraminal encroachment. Stable from MRI done in August 2014.  Review Of Systems: Per HPI, otherwise 12 point review of systems was performed  and was unremarkable.  Patient Active Problem List   Diagnosis Date Noted  . Left hip pain 04/20/2014  . Hip pain   . Depression   . Lumbago 03/07/2014  . Pre-diabetes 01/14/2014  . Nail avulsion, toe 01/14/2014  . Spinal stenosis, lumbar region, with neurogenic claudication 11/21/2013  . Back pain 06/06/2013  . Dizziness 05/08/2013  . Chronic dental pain 10/13/2012  . Rash and nonspecific skin eruption 10/07/2012  . Hyperglycemia 08/15/2012  . Low back pain radiating to left leg 07/17/2012  . Somatic dysfunction - Osteopathic Findings 07/02/2012  . Right foot pain 06/30/2012  . Headache(784.0) 05/22/2012  . Paroxysmal dyspnea 05/11/2012  . Rotator cuff tear, left 05/05/2012  . Chest pain 04/26/2012  . Decreased hearing 12/29/2011  . Eczema, dyshidrotic 06/02/2010  . Pain in right ankle 05/06/2010  . FIBROIDS, UTERUS 10/24/2009  . GERD 06/09/2009  . Chronic pain syndrome 10/04/2008  . HIP PAIN, LEFT, CHRONIC 08/16/2007  . Hepatitis C carrier 03/21/2007  . OBESITY, NOS 06/16/2006  . DEPRESSION, MAJOR, RECURRENT 06/16/2006  . HYPERTENSION, BENIGN SYSTEMIC 06/16/2006   Past Medical History: Past Medical History  Diagnosis Date  . Hypertension   . Acid reflux   . Tachyarrhythmia   . Left shoulder pain   . Back pain   . Trigger point of thoracic region 03/22/2012  . Irregular heart beat    Past Surgical History: Past Surgical History  Procedure Laterality Date  . Ankle surgery    . Rotator cuff repair Left 02/20/14   Social History: History  Substance Use Topics  . Smoking status: Never Smoker   . Smokeless tobacco: Never Used  . Alcohol Use: Yes     Comment: occasionally   Additional social history: denies daily alcohol use. Denies drug use.  Please also refer to relevant sections of EMR.  Family History: Family History  Problem Relation Age of Onset  . Diabetes Brother   . Diabetes Maternal Aunt   . Diabetes Paternal Aunt   . Diabetes Maternal  Grandmother   . Diabetes Maternal Grandfather   . Diabetes Paternal Grandmother   . Diabetes Paternal Grandfather    Allergies and Medications: Allergies  Allergen Reactions  . Soma [Carisoprodol]     Itching/rash around mouth, itching back of throat.   . Nortriptyline Rash  . Prednisone Rash    REACTION: rash and itching.    No current facility-administered medications on file prior to encounter.   Current Outpatient Prescriptions on File Prior to Encounter  Medication Sig Dispense Refill  . acetaminophen (TYLENOL) 650 MG CR tablet Take 1 tablet (650 mg total) by mouth every 8 (eight) hours. 30 tablet 3  . ARIPiprazole (ABILIFY) 2 MG tablet Take 2 mg by mouth daily.    . beta carotene w/minerals (OCUVITE) tablet Take 1 tablet by mouth daily.    . Blood Glucose Monitoring Suppl (BLOOD GLUCOSE METER KIT AND SUPPLIES) Dispense based on patient and insurance preference. Use up to four times daily as directed. (FOR ICD-9 250.00, 250.01). 1 each 0  . buPROPion (WELLBUTRIN XL) 300 MG 24 hr tablet Take 300 mg by mouth daily.    . cyclobenzaprine (FLEXERIL) 10 MG tablet Take 1 tablet (10 mg total) by mouth 3 (three) times daily  as needed for muscle spasms. 90 tablet 1  . diclofenac sodium (VOLTAREN) 1 % GEL Apply 2 g topically 4 (four) times daily. 100 g 3  . diltiazem (CARDIZEM SR) 60 MG 12 hr capsule Take 1 capsule (60 mg total) by mouth 2 (two) times daily. 180 capsule 3  . EPINEPHrine (EPIPEN) 0.3 mg/0.3 mL SOAJ Inject 0.3 mLs (0.3 mg total) into the muscle once. 1 Device 1  . fish oil-omega-3 fatty acids 1000 MG capsule Take 2 g by mouth daily.    Marland Kitchen gabapentin (NEURONTIN) 300 MG capsule Take 3 capsules (900 mg total) by mouth 3 (three) times daily. 180 capsule 2  . hydrochlorothiazide (HYDRODIURIL) 25 MG tablet Take 1 tablet (25 mg total) by mouth daily. 90 tablet 3  . hydrOXYzine (ATARAX/VISTARIL) 10 MG tablet Take 10 mg by mouth 3 (three) times daily as needed for itching.    Marland Kitchen  ibuprofen (ADVIL,MOTRIN) 800 MG tablet take 1 tablet by mouth every 8 hours if needed 15 tablet 0  . isosorbide mononitrate (IMDUR) 30 MG 24 hr tablet Take 30 mg by mouth daily.    . metFORMIN (GLUCOPHAGE) 500 MG tablet Take 1 tablet (500 mg total) by mouth 2 (two) times daily with a meal. 180 tablet 3  . metoprolol tartrate (LOPRESSOR) 25 MG tablet Take 1 tablet (25 mg total) by mouth 2 (two) times daily. 180 tablet 3  . milk thistle 175 MG tablet Take 350 mg by mouth daily.    . Multiple Vitamin (MULTIVITAMIN WITH MINERALS) TABS tablet Take 1 tablet by mouth daily.    Marland Kitchen omeprazole (PRILOSEC) 20 MG capsule take 1 capsule by mouth once daily 30 capsule 3  . potassium chloride (K-DUR) 10 MEQ tablet Take 10 mEq by mouth 2 (two) times daily.    . pregabalin (LYRICA) 75 MG capsule Take 1 capsule (75 mg total) by mouth 2 (two) times daily. 60 capsule 3  . SUMAtriptan (IMITREX) 50 MG tablet Take 1 tablet (50 mg total) by mouth every 2 (two) hours as needed for migraine or headache. Do not exceed 200 mg daily. 10 tablet 0  . traZODone (DESYREL) 100 MG tablet Take 2 tablets (200 mg total) by mouth at bedtime. 180 tablet 3    Objective: BP 144/87 mmHg  Pulse 84  Temp(Src) 97.8 F (36.6 C) (Oral)  Resp 18  Ht _0  (1.727 m)  Wt 252 lb (114.306 kg)  BMI 38.33 kg/m2  SpO2 94% Exam: General: NAD, pleasant, cooperative, lying in bed HEENT: NCAT, MMM Cardiovascular: RRR no murmurs Respiratory: CTAB, NWOB Abdomen: soft, NTTP, no masses or organomegaly Extremities: No appreciable lower extremity edema bilaterally. L hip with TTP over greater trochanteric bursa. Full strength RLE. Unable to really assess strength over LLE due to pt c/o pain. Able to move toes bilaterally, full strength with ankle plantar & dorsiflexion bilaterally. Can flex L hip passively about 30 degrees before pain is too intense. Minimal participation with active hip flexion testing. Sensation intact to soft touch over bilateral  lower extremities. Skin: no rashes noted Neuro: grossly nonfocal, speech normal  Labs and Imaging: CBC BMET   Recent Labs Lab 04/20/14 1442  WBC 7.6  HGB 12.7  HCT 39.9  PLT 189    Recent Labs Lab 04/20/14 1442  NA 138  K 3.5  CL 105  CO2 25  BUN 13  CREATININE 0.74  GLUCOSE 101*  CALCIUM 8.7     DG Hip L Complete: 1. Ill-defined calcification noted along  the proximal hip adductor musculature, likely dystrophic. No acute bony findings.  CT Pelvis Wo Contrast There is no free fluid in the pelvis. Bladder is unremarkable. Prominent left ovary stable comparing back to study from 04/08/2013. Uterus unremarkable. Right ovary normal.  No evidence for pelvis fracture. No left femoral neck fracture. No worrisome lytic or sclerotic osseous abnormality.  No evidence for groin hernia.  IMPRESSION: No findings to explain the patient's history of left hip pain.  Leeanne Rio, MD 04/20/2014, 11:01 PM PGY-3, [place] [facility] pager: [phone], text pages welcome

## 2014-04-20 NOTE — Telephone Encounter (Signed)
Emergency Line / After Hours Call  Pt called the emergency line because she has been laying in bed since Tuesday. Unable to walk due to pain. Can't sit on it on pillow in chart, painful to sit in toilet. L hip is swollen. Pain is intense. Ice is not helping, nor are antiinflammatories. Pt planning to come into ER today. I advised this is a good plan.  Chrisandra Netters, MD Family Medicine PGY-3

## 2014-04-20 NOTE — ED Provider Notes (Signed)
CSN: [id number]     Arrival date & time 04/20/14  1244 History   First MD Initiated Contact with Patient 04/20/14 1410     Chief Complaint  Patient presents with  . Hip Pain    Lt hip pain     (Consider location/radiation/quality/duration/timing/severity/associated sxs/prior Treatment) Patient is a 54 y.o. female presenting with hip pain. The history is provided by the patient.  Hip Pain This is a chronic problem. The current episode started more than 1 week ago. The problem occurs constantly. The problem has been rapidly worsening. Pertinent negatives include no chest pain, no abdominal pain and no shortness of breath. Nothing aggravates the symptoms. Nothing relieves the symptoms. She has tried nothing for the symptoms.    Past Medical History  Diagnosis Date  . Hypertension   . Acid reflux   . Tachyarrhythmia   . Left shoulder pain   . Back pain   . Trigger point of thoracic region 03/22/2012  . Irregular heart beat    Past Surgical History  Procedure Laterality Date  . Ankle surgery    . Rotator cuff repair Left 02/20/14   Family History  Problem Relation Age of Onset  . Diabetes Brother   . Diabetes Maternal Aunt   . Diabetes Paternal Aunt   . Diabetes Maternal Grandmother   . Diabetes Maternal Grandfather   . Diabetes Paternal Grandmother   . Diabetes Paternal Grandfather    History  Substance Use Topics  . Smoking status: Never Smoker   . Smokeless tobacco: Never Used  . Alcohol Use: Yes     Comment: occasionally   OB History    No data available     Review of Systems  Constitutional: Negative for fever.  Respiratory: Negative for cough and shortness of breath.   Cardiovascular: Negative for chest pain.  Gastrointestinal: Negative for vomiting and abdominal pain.  All other systems reviewed and are negative.     Allergies  Soma; Nortriptyline; and Prednisone  Home Medications   Prior to Admission medications   Medication Sig Start Date End Date  Taking? Authorizing Provider  acetaminophen (TYLENOL) 650 MG CR tablet Take 1 tablet (650 mg total) by mouth every 8 (eight) hours. 03/12/13  Yes Dayarmys Piloto de Gwendalyn Ege, MD  ARIPiprazole (ABILIFY) 2 MG tablet Take 2 mg by mouth daily.   Yes Historical Provider, MD  beta carotene w/minerals (OCUVITE) tablet Take 1 tablet by mouth daily.   Yes Historical Provider, MD  Blood Glucose Monitoring Suppl (BLOOD GLUCOSE METER KIT AND SUPPLIES) Dispense based on patient and insurance preference. Use up to four times daily as directed. (FOR ICD-9 250.00, 250.01). 01/14/14  Yes [name], MD  buPROPion (WELLBUTRIN XL) 300 MG 24 hr tablet Take 300 mg by mouth daily.   Yes Historical Provider, MD  cyclobenzaprine (FLEXERIL) 10 MG tablet Take 1 tablet (10 mg total) by mouth 3 (three) times daily as needed for muscle spasms. 03/19/14  Yes [name], MD  diclofenac sodium (VOLTAREN) 1 % GEL Apply 2 g topically 4 (four) times daily. 03/29/14  Yes [name], MD  diltiazem (CARDIZEM SR) 60 MG 12 hr capsule Take 1 capsule (60 mg total) by mouth 2 (two) times daily. 05/08/13  Yes Josalyn C Funches, MD  EPINEPHrine (EPIPEN) 0.3 mg/0.3 mL SOAJ Inject 0.3 mLs (0.3 mg total) into the muscle once. 11/02/12  Yes [name], MD  fish oil-omega-3 fatty acids 1000 MG capsule Take 2 g by mouth  daily.   Yes Historical Provider, MD  gabapentin (NEURONTIN) 300 MG capsule Take 3 capsules (900 mg total) by mouth 3 (three) times daily. 11/21/13  Yes Bryan R Hess, DO  hydrochlorothiazide (HYDRODIURIL) 25 MG tablet Take 1 tablet (25 mg total) by mouth daily. 03/19/14  Yes [name], MD  hydrOXYzine (ATARAX/VISTARIL) 10 MG tablet Take 10 mg by mouth 3 (three) times daily as needed for itching.   Yes Historical Provider, MD  ibuprofen (ADVIL,MOTRIN) 800 MG tablet take 1 tablet by mouth every 8 hours if needed 04/08/14  Yes [name], MD  isosorbide mononitrate (IMDUR) 30 MG 24 hr tablet Take 30 mg by mouth  daily.   Yes Historical Provider, MD  metFORMIN (GLUCOPHAGE) 500 MG tablet Take 1 tablet (500 mg total) by mouth 2 (two) times daily with a meal. 03/19/14  Yes [name], MD  methocarbamol (ROBAXIN) 500 MG tablet Take 500 mg by mouth 4 (four) times daily.   Yes Historical Provider, MD  metoprolol tartrate (LOPRESSOR) 25 MG tablet Take 1 tablet (25 mg total) by mouth 2 (two) times daily. 01/14/14  Yes [name], MD  milk thistle 175 MG tablet Take 350 mg by mouth daily.   Yes Historical Provider, MD  Multiple Vitamin (MULTIVITAMIN WITH MINERALS) TABS tablet Take 1 tablet by mouth daily.   Yes Historical Provider, MD  omeprazole (PRILOSEC) 20 MG capsule take 1 capsule by mouth once daily   Yes [name], MD  potassium chloride (K-DUR) 10 MEQ tablet Take 10 mEq by mouth 2 (two) times daily.   Yes Historical Provider, MD  pregabalin (LYRICA) 75 MG capsule Take 1 capsule (75 mg total) by mouth 2 (two) times daily. 03/19/14  Yes [name], MD  SUMAtriptan (IMITREX) 50 MG tablet Take 1 tablet (50 mg total) by mouth every 2 (two) hours as needed for migraine or headache. Do not exceed 200 mg daily. 06/20/13  Yes Coral Spikes, DO  traZODone (DESYREL) 100 MG tablet Take 2 tablets (200 mg total) by mouth at bedtime. 03/19/14  Yes [name], MD   BP 143/84 mmHg  Pulse 77  Temp(Src) 98.4 F (36.9 C) (Oral)  Resp 17  SpO2 99% Physical Exam  Constitutional: She is oriented to person, place, and time. She appears well-developed and well-nourished. No distress.  HENT:  Head: Normocephalic and atraumatic.  Mouth/Throat: Oropharynx is clear and moist. No oropharyngeal exudate.  Eyes: EOM are normal. Pupils are equal, round, and reactive to light.  Neck: Normal range of motion. Neck supple.  Cardiovascular: Normal rate and regular rhythm.  Exam reveals no friction rub.   No murmur heard. Pulmonary/Chest: Effort normal and breath sounds normal. No respiratory distress. She has no  wheezes. She has no rales.  Abdominal: Soft. She exhibits no distension. There is no tenderness. There is no rebound.  Musculoskeletal: She exhibits no edema.       Left hip: She exhibits decreased range of motion and bony tenderness (lateral). She exhibits normal strength.  Neurological: She is alert and oriented to person, place, and time.  Skin: No rash noted. She is not diaphoretic.  Nursing note and vitals reviewed.   ED Course  Procedures (including critical care time) Labs Review Labs Reviewed  BASIC METABOLIC PANEL - Abnormal; Notable for the following:    Glucose, Bld 101 (*)    All other components within normal limits  CBC    Imaging Review Dg Hip Complete Left  04/20/2014   CLINICAL DATA:  Left hip pain over the past 4 days. Difficulty walking.  EXAM: LEFT HIP - COMPLETE 2+ VIEW  COMPARISON:  07/28/2008  FINDINGS: Faint calcification noted along the hip adductor musculature, similar to prior and likely dystrophic. Mild spurring of the trochanters. Articular space in the hips appears preserved.  No fracture or acute bony findings observed.  IMPRESSION: 1. Ill-defined calcification noted along the proximal hip adductor musculature, likely dystrophic. No acute bony findings.   Electronically Signed   By: Sherryl Barters M.D.   On: 04/20/2014 15:16     EKG Interpretation None      MDM   Final diagnoses:  Hip pain  Left hip pain    39F here with left hip pain. Patient has chronic back pain due to degeneterative disease. She denies recent trauma. Worsening back and hip pain to where she cannot bear weight and has been crawling around for past 5 days. Here, poor effort on exam. Normal xray, will CT. Labs ok. Since unable to ambulate, admitted to family practice for PT.    Evelina Bucy, MD 04/20/14 [phone]

## 2014-04-20 NOTE — ED Notes (Signed)
Pain in Lt hip due to nerve pain.  EMS gave 50 Fentanyl and pain is down to 3/10 per EMS. Hx of HTN, 115 cbg,  Per diabetic per EMS.

## 2014-04-20 NOTE — ED Notes (Signed)
Mother at bedside.  Delay explained to family. Report being called now.

## 2014-04-20 NOTE — ED Notes (Signed)
Bed: WA20 Expected date: 04/20/14 Expected time: 12:41 PM Means of arrival: Ambulance Comments: Pain management

## 2014-04-20 NOTE — ED Notes (Signed)
Pt reports cannot get around at home with walker and has to crawl because she cannot put any weight on Lt leg.  Lt hip swollen and pain running down front and back of leg. Pain is getting worse.  Family at bedside.

## 2014-04-21 LAB — GLUCOSE, CAPILLARY: Glucose-Capillary: 96 mg/dL (ref 70–99)

## 2014-04-21 LAB — C-REACTIVE PROTEIN: CRP: <0.5 mg/dL — ABNORMAL LOW (ref <0.60)

## 2014-04-21 LAB — SEDIMENTATION RATE: Sed Rate: 8 mm/hr (ref 0–22)

## 2014-04-21 MED ORDER — OXYCODONE HCL 5 MG PO TABS
10.0000 mg | ORAL_TABLET | ORAL | Status: DC | PRN
  Administered 2014-04-21 – 2014-04-22 (×5): 10 mg via ORAL
  Filled 2014-04-21 (×5): qty 2

## 2014-04-21 MED ORDER — POLYETHYLENE GLYCOL 3350 17 G PO PACK
17.0000 g | PACK | Freq: Every day | ORAL | Status: DC
  Administered 2014-04-21 – 2014-04-22 (×2): 17 g via ORAL
  Filled 2014-04-21 (×3): qty 1

## 2014-04-21 MED ORDER — LIDOCAINE HCL 1 % IJ SOLN
40.0000 mg | Freq: Once | INTRAMUSCULAR | Status: DC
Start: 2014-04-21 — End: 2014-04-22
  Filled 2014-04-21: qty 5

## 2014-04-21 MED ORDER — SENNA 8.6 MG PO TABS
1.0000 | ORAL_TABLET | Freq: Every day | ORAL | Status: DC
  Administered 2014-04-21 – 2014-04-22 (×2): 8.6 mg via ORAL
  Filled 2014-04-21 (×2): qty 1

## 2014-04-21 NOTE — Evaluation (Signed)
Physical Therapy Evaluation Patient Details Name: Norma [name] MRN: [id number] DOB: 24-Feb-1961 Today's Date: 04/21/2014   History of Present Illness  54 y.o. female presenting with left hip pain. PMH is significant for spinal stenosis, chronic pain, GERD, depression, hypertension.  Clinical Impression  Pt adm due to above. Pt presents with significant decreased independence with functional mobility secondary to increased pain and inability to WB through Lt hip. Pt to benefit from skilled acute PT to address deficits and maximize functional mobility prior to D/C home.   After working with pt, pt was unable to perform pre gt activities with therapy due to pain; when is spoke with RN ~1 hour after session, pt had independently gotten from chair to bed without knowledge of nursing staff.     Follow Up Recommendations Outpatient PT;Supervision/Assistance - 24 hour    Equipment Recommendations  Rolling walker with 5" wheels    Recommendations for Other Services       Precautions / Restrictions Precautions Precautions: Fall Restrictions Weight Bearing Restrictions: No      Mobility  Bed Mobility Overal bed mobility: Needs Assistance Bed Mobility: Rolling;Supine to Sit;Sit to Supine Rolling: Supervision;Modified independent (Device/Increase time)   Supine to sit: Modified independent (Device/Increase time) Sit to supine: Modified independent (Device/Increase time)   General bed mobility comments: up in chair  Transfers Overall transfer level: Needs assistance Equipment used: Rolling walker (2 wheeled) Transfers: Sit to/from Stand Sit to Stand: Mod assist Stand pivot transfers: Min guard Squat pivot transfers: Min guard     General transfer comment: cues for hand placement and mod (A) to shift weight fwd and power up; pt limited in amount of weight she can bear through Lt LE; pt unsteady with standing   Ambulation/Gait             General Gait Details: attempted pre  gt activities while standing at chair; pt began screaming and crying in pain and flopped into chair without warning; cues for safety   Stairs            Wheelchair Mobility    Modified Rankin (Stroke Patients Only)       Balance Overall balance assessment: Needs assistance;History of Falls Sitting-balance support: Feet supported Sitting balance-Leahy Scale: Poor Sitting balance - Comments: heavily leans to Rt to off weight Lt hip Postural control: Right lateral lean;Posterior lean Standing balance support: During functional activity;Bilateral upper extremity supported Standing balance-Leahy Scale: Poor Standing balance comment: RW to balance; pt leaning to Rt due to pain in Lt hip with WB'ing                              Pertinent Vitals/Pain Pain Assessment: 0-10 Pain Score: 10-Worst pain ever Pain Location: with WB and movement of Lt Hip  Pain Descriptors / Indicators: Shooting;Sharp;Guarding;Crying Pain Intervention(s): Limited activity within patient's tolerance;Monitored during session;Premedicated before session;Repositioned;Ice applied    Home Living Family/patient expects to be discharged to:: Private residence Living Arrangements: Children Available Help at Discharge: Family;Available PRN/intermittently Type of Home: House Home Access: Stairs to enter Entrance Stairs-Rails: Left Entrance Stairs-Number of Steps: 2 Home Layout: One level Home Equipment: Walker - standard;Cane - single point;Crutches      Prior Function Level of Independence: Needs assistance   Gait / Transfers Assistance Needed: needed assist walking for last week  ADL's / Homemaking Assistance Needed: assist with bathing/dressing for last week  Comments: pt using standward walker due to pain  Hand Dominance        Extremity/Trunk Assessment   Upper Extremity Assessment: Defer to OT evaluation           Lower Extremity Assessment: LLE deficits/detail   LLE  Deficits / Details: quad 3/5; pt guarded and limited MMT due to crying   Cervical / Trunk Assessment: Normal  Communication   Communication: No difficulties  Cognition Arousal/Alertness: Awake/alert Behavior During Therapy: Anxious Overall Cognitive Status: Within Functional Limits for tasks assessed                      General Comments General comments (skin integrity, edema, etc.): encouraged OOB activity to promote activity tolerance and incr cardiopulm hygiene; pt keeping Lt LE in IR thoroughout session; cues for neutral positioning     Exercises General Exercises - Lower Extremity Ankle Circles/Pumps: AROM;Both;AAROM;5 reps;Seated Long Arc Quad: AROM;AAROM;Left;5 reps;Seated;Limitations Long [employer] Limitations: pain      Assessment/Plan    PT Assessment Patient needs continued PT services  PT Diagnosis Difficulty walking;Generalized weakness;Acute pain   PT Problem List Decreased strength;Decreased activity tolerance;Decreased range of motion;Decreased balance;Decreased mobility;Decreased knowledge of use of DME;Decreased safety awareness;Pain;Obesity  PT Treatment Interventions DME instruction;Gait training;Stair training;Functional mobility training;Therapeutic activities;Balance training;Therapeutic exercise;Neuromuscular re-education;Patient/family education   PT Goals (Current goals can be found in the Care Plan section) Acute Rehab PT Goals Patient Stated Goal: to not have pain  PT Goal Formulation: With patient Time For Goal Achievement: 04/27/14 Potential to Achieve Goals: Good    Frequency Min 4X/week   Barriers to discharge Decreased caregiver support lives with 22 y.o. son, does not have 24/7 (A)    Co-evaluation               End of Session Equipment Utilized During Treatment: Gait belt Activity Tolerance: Patient limited by pain Patient left: in chair;with call bell/phone within reach;with family/visitor present Nurse Communication:  Mobility status;Precautions;Patient requests pain meds    Functional Assessment Tool Used: clinical judgement  Functional Limitation: Mobility: Walking and moving around Mobility: Walking and Moving Around Current Status [phone]): At least 20 percent but less than 40 percent impaired, limited or restricted Mobility: Walking and Moving Around Goal Status [phone]): At least 1 percent but less than 20 percent impaired, limited or restricted    Time: 1051-1107 PT Time Calculation (min) (ACUTE ONLY): 16 min   Charges:   PT Evaluation $Initial PT Evaluation Tier I: 1 Procedure PT Treatments $Therapeutic Activity: 8-22 mins   PT G Codes:   PT G-Codes **NOT FOR INPATIENT CLASS** Functional Assessment Tool Used: clinical judgement  Functional Limitation: Mobility: Walking and moving around Mobility: Walking and Moving Around Current Status (P7106): At least 20 percent but less than 40 percent impaired, limited or restricted Mobility: Walking and Moving Around Goal Status [phone]): At least 1 percent but less than 20 percent impaired, limited or restricted    Gustavus Bryant, [place] 04/21/2014, 1:15 PM

## 2014-04-21 NOTE — Progress Notes (Signed)
Pt was able to stand and pivot to use the bed side commode. She was able to sit on the bsc for a few minutes before her pain became severe. She quickly moved to the bed and fell backwards on the bed because of her hip pain. She has been resting well with the dilaudid IV for pain relief. Pt. States she has not had a BM since 12/28 because her hip was too painful to  sit on the commode.

## 2014-04-21 NOTE — Progress Notes (Signed)
Pt states she is having less pain while lying in bed. She is still unable to bear weight without severe pain in her left hip. She is much calmer tonight and pleased with the results from the injection.

## 2014-04-21 NOTE — Clinical Social Work Psychosocial (Signed)
CSW received consult for assistance with medications. Consult not appropriate for social work services. Please consult RNCM for assistance with medication.   [place], [place] Weekend Clinical Social Worker [phone]

## 2014-04-21 NOTE — Progress Notes (Signed)
Family Medicine Teaching Service Daily Progress Note Intern Pager: [phone]  Patient name: Norma [name] Medical record number: [id number] Date of birth: Jul 25, 1960 Age: 54 y.o. Gender: female  Primary Care Provider: Langston Masker, MD Consultants: none Code Status: Full  Pt Overview and Major Events to Date:  1/2: admitted for Lt hip pain and inability to ambulate  Assessment and Plan: Norma [name] is a 54 y.o. female presenting with left hip pain. PMH is significant for spinal stenosis, chronic pain, GERD, depression, hypertension.  # Left hip pain: Likely multifactorial. Hip x-ray and CT pelvis without any acute bony findings to explain acute pain. MRI of lumbar spine from earlier in December 2015 shows multilevel degenerative changes with spinal stenosis and foraminal encroachment. Patient is followed by Dr. Berenice Primas as an outpatient for a variety of musculoskeletal complaints. She is working on getting in with a pain clinic, but will not be able to be seen there until the end of March. I suspect she has radicular nerve pain combined with bursitis. She has tenderness over the greater trochanter, suggesting possible greater trochanteric bursitis. Concern for cauda equina syndrome is low. -PT and OT consult -Dilaudid 1 mg q4hprn severe pain >> transitioning to PO -Continue flexeril prn, also scheduled gabapentin. Tylenol for less severe pain. -Bedside injection of greater trochanteric bursa >> 1/3 -Consider additional spinal imaging if no improvement -ESR wnl -CRP pending -Consider orthopedic consult  # Hypertension: normotensive currently - continue home regimen: HCTZ, Imdur, metoprolol, Cardizem - BP 105/69 >> held HCTZ 1/3  # Depression: Likely contributing to much of her chronic pain -Continue home Abilify, Wellbutrin, trazodone  # GERD: Continue Protonix   FEN/GI: SLIV; HH diet PPx: SQ Hep  Disposition: home when medically stable  Subjective:  Continues to have L  sided hip pain. Unable to be walked by PT today. Will perform bedside GT injection today.  Objective: Temp:  [97.8 F (36.6 C)-98.4 F (36.9 C)] 97.9 F (36.6 C) (01/03 0600) Pulse Rate:  [75-94] 94 (01/03 0948) Resp:  [16-20] 16 (01/03 0600) BP: (103-171)/(61-93) 105/69 mmHg (01/03 0948) SpO2:  [94 %-99 %] 95 % (01/03 0600) Weight:  [252 lb (114.306 kg)] 252 lb (114.306 kg) (01/02 1838) Physical Exam: General: NAD, pleasant, cooperative, lying in bed HEENT: NCAT, MMM Cardiovascular: RRR no murmurs Respiratory: CTAB, NWOB Abdomen: soft, NTTP, no masses or organomegaly Extremities: No appreciable lower extremity edema bilaterally. L hip with TTP over greater trochanteric bursa. Full strength RLE. Unable to really assess strength over LLE due to pt c/o pain. Able to move toes bilaterally, full strength with ankle plantar & dorsiflexion bilaterally. Minimal participation at best. Sensation intact to soft touch over bilateral lower extremities. Skin: no rashes noted Neuro: grossly nonfocal, speech normal  Laboratory:  Recent Labs Lab 04/20/14 1442  WBC 7.6  HGB 12.7  HCT 39.9  PLT 189    Recent Labs Lab 04/20/14 1442  NA 138  K 3.5  CL 105  CO2 25  BUN 13  CREATININE 0.74  CALCIUM 8.7  GLUCOSE 101*    ESR - 8 CRP - pending  Imaging/Diagnostic Tests: CT Pelvis 1/2 IMPRESSION: No findings to explain the patient's history of left hip pain.  XR Lt Hip 1/2 IMPRESSION: 1. Ill-defined calcification noted along the proximal hip adductor musculature, likely dystrophic. No acute bony findings.    Elberta Leatherwood, MD 04/21/2014, 12:24 PM PGY-1, [facility] Intern pager: [phone], text pages welcome

## 2014-04-21 NOTE — Progress Notes (Signed)
UR completed 

## 2014-04-21 NOTE — Progress Notes (Signed)
INJECTION: Patient was given informed consent, signed copy in the chart. Appropriate time out was taken. Area prepped and draped in usual sterile fashion. 1 cc of decadron 40 mg/ml plus  4 cc of 1% lidocaine without epinephrine was injected into the greater trochanteric bursa using a(n) lateral approach. The patient tolerated the procedure well. There were no complications. Post procedure instructions were given.

## 2014-04-21 NOTE — Evaluation (Addendum)
Occupational Therapy Evaluation Patient Details Name: Norma [name] MRN: [id number] DOB: [birth year] Today's Date: 04/21/2014    History of Present Illness 54 y.o. female presenting with left hip pain. PMH is significant for spinal stenosis, chronic pain, GERD, depression, hypertension.   Clinical Impression   Pt admitted with above. Pt requiring assist with ADLs for past week. Feel pt will benefit from acute OT to increase independence with BADLs, prior to d/c.     Follow Up Recommendations  Home health OT;Supervision - Intermittent (when OOB/mobility)    Equipment Recommendations  Tub/shower bench;3 in 1 bedside comode    Recommendations for Other Services       Precautions / Restrictions Precautions Precautions: Fall Restrictions Weight Bearing Restrictions: No      Mobility Bed Mobility Overal bed mobility: Needs Assistance Bed Mobility: Rolling;Supine to Sit;Sit to Supine Rolling: Supervision;Modified independent (Device/Increase time)/Modified independent   Supine to sit: Modified independent (Device/Increase time) Sit to supine: Modified independent (Device/Increase time)      Transfers Overall transfer level: Needs assistance Equipment used: Rolling walker (2 wheeled) Transfers: Sit to/from Marketing executive Transfers Sit to Stand: Min guard;Min assist Stand pivot transfers: Min guard Squat pivot transfers: Min guard     General transfer comment: cues for hand placement and not to sit too early    Balance                                            ADL Overall ADL's : Needs assistance/impaired                 Upper Body Dressing : Set up;Sitting   Lower Body Dressing: With adaptive equipment;Bed level;Minimal assistance   Toilet Transfer: Min guard;Stand-pivot;Squat-pivot;RW (bed/chair)           Functional mobility during ADLs: Min guard (stand pivot/squat pivot) General ADL Comments: Educated on  alternative techniques for LB ADLs (leaning/rolling). Pt attempted to stand and pull up panties, but limited by pain. Tried to pull up in chair as well, but ended up having to pull up by rolling in bed. Educated on LB dressing technique. Educated on safety (safe shoewear, use of bag on walker, rugs, sitting for most of LB ADLs). Educated on tub transfer technique and discussed tub bench.  Educated on AE-Pt practiced with reacher/sockaid. Encouraged pt to sit up in chair and told her it was beneficial.     Vision  Pt wears reading glasses.                   Perception     Praxis      Pertinent Vitals/Pain Pain Assessment: 0-10 Pain Score: 7  Pain Location: left hip Pain Intervention(s): Repositioned;Monitored during session;Limited activity within patient's tolerance     Hand Dominance     Extremity/Trunk Assessment Upper Extremity Assessment Upper Extremity Assessment: Overall WFL for tasks assessed   Lower Extremity Assessment Lower Extremity Assessment: Defer to PT evaluation       Communication Communication Communication: No difficulties   Cognition Arousal/Alertness: Awake/alert Behavior During Therapy: WFL for tasks assessed/performed Overall Cognitive Status: Within Functional Limits for tasks assessed                     General Comments       Exercises       Shoulder Instructions  Home Living Family/patient expects to be discharged to:: Private residence Living Arrangements: Children Available Help at Discharge: Family;Available PRN/intermittently Type of Home: House Home Access: Stairs to enter [employer] of Steps: 2 Entrance Stairs-Rails: Left Home Layout: One level     Bathroom Shower/Tub: Teacher, early years/pre: Standard (sink close)     Home Equipment: Walker - standard;Cane - single point;Crutches          Prior Functioning/Environment Level of Independence: Needs assistance  Gait / Transfers  Assistance Needed: needed assist walking for last week ADL's / Homemaking Assistance Needed: assist with bathing/dressing for last week        OT Diagnosis: Acute pain   OT Problem List: Decreased strength;Obesity;Decreased knowledge of use of DME or AE;Decreased knowledge of precautions;Decreased range of motion;Decreased activity tolerance;Pain   OT Treatment/Interventions: Self-care/ADL training;DME and/or AE instruction;Therapeutic activities;Patient/family education;Balance training    OT Goals(Current goals can be found in the care plan section) Acute Rehab OT Goals Patient Stated Goal: not stated OT Goal Formulation: With patient Time For Goal Achievement: 04/28/14 Potential to Achieve Goals: Good ADL Goals Pt Will Perform Lower Body Dressing: with modified independence;sit to/from stand;with adaptive equipment Pt Will Transfer to Toilet: with modified independence;ambulating;bedside commode Pt Will Perform Toileting - Clothing Manipulation and hygiene: with modified independence;sit to/from stand Pt Will Perform Tub/Shower Transfer: Tub transfer;with supervision;ambulating;tub bench;rolling walker  OT Frequency: Min 2X/week   Barriers to D/C:            Co-evaluation              End of Session Equipment Utilized During Treatment: Gait belt;Rolling walker Nurse Communication: Mobility status  Activity Tolerance: Patient limited by pain Patient left: in chair;with call bell/phone within reach;with family/visitor present   Time: 4656-8127 OT Time Calculation (min): 27 min Charges:  OT General Charges $OT Visit: 1 Procedure OT Evaluation $Initial OT Evaluation Tier I: 1 Procedure OT Treatments $Self Care/Home Management : 8-22 mins G-Codes: OT G-codes **NOT FOR INPATIENT CLASS** Functional Assessment Tool Used: clinical judgment Functional Limitation: Self care Self Care Current Status (N1700): At least 20 percent but less than 40 percent impaired, limited  or restricted Self Care Goal Status (F7494): At least 1 percent but less than 20 percent impaired, limited or restricted  Benito Mccreedy OTR/L [phone] 04/21/2014, 11:31 AM

## 2014-04-22 ENCOUNTER — Ambulatory Visit: Payer: Medicaid Other | Admitting: Family Medicine

## 2014-04-22 DIAGNOSIS — G894 Chronic pain syndrome: Secondary | ICD-10-CM

## 2014-04-22 MED ORDER — OXYCODONE-ACETAMINOPHEN 5-325 MG PO TABS: 1.0000 | ORAL_TABLET | ORAL | Status: DC | PRN

## 2014-04-22 NOTE — Progress Notes (Signed)
D/C instructions and scripts given to Pt.s  IV D/Cd and Pt verbalized understanding of home care. Pts family member at the bedside to take her home at this time.

## 2014-04-22 NOTE — Discharge Instructions (Signed)
You were admitted for left hip pain.  This is thought to be related to sciatica and bursitis. You got an injection into your bursa on your hip.  This should start working in a few days.  Take ibuprofen for pain.  For any breakthrough pain, I am giving you a few percocet.  Hip Bursitis Bursitis is a swelling and soreness (inflammation) of a fluid-filled sac (bursa). This sac overlies and protects the joints.  CAUSES   Injury.  Overuse of the muscles surrounding the joint.  Arthritis.  Gout.  Infection.  Cold weather.  Inadequate warm-up and conditioning prior to activities. The cause may not be known.  SYMPTOMS   Mild to severe irritation.  Tenderness and swelling over the outside of the hip.  Pain with motion of the hip.  If the bursa becomes infected, a fever may be present. Redness, tenderness, and warmth will develop over the hip. Symptoms usually lessen in 3 to 4 weeks with treatment, but can come back. TREATMENT If conservative treatment does not work, your caregiver may advise draining the bursa and injecting cortisone into the area. This may speed up the healing process. This may also be used as an initial treatment of choice. HOME CARE INSTRUCTIONS   Apply ice to the affected area for 15-20 minutes every 3 to 4 hours while awake for the first 2 days. Put the ice in a plastic bag and place a towel between the bag of ice and your skin.  Rest the painful joint as much as possible, but continue to put the joint through a normal range of motion at least 4 times per day. When the pain lessens, begin normal, slow movements and usual activities to help prevent stiffness of the hip.  Only take over-the-counter or prescription medicines for pain, discomfort, or fever as directed by your caregiver.  Use crutches to limit weight bearing on the hip joint, if advised.  Elevate your painful hip to reduce swelling. Use pillows for propping and cushioning your legs and  hips.  Gentle massage may provide comfort and decrease swelling. SEEK IMMEDIATE MEDICAL CARE IF:   Your pain increases even during treatment, or you are not improving.  You have a fever.  You have heat and inflammation over the involved bursa.  You have any other questions or concerns. MAKE SURE YOU:   Understand these instructions.  Will watch your condition.  Will get help right away if you are not doing well or get worse. Document Released: 09/25/2001 Document Revised: 06/28/2011 Document Reviewed: 04/24/2008 [facility] Patient Information 2015 [place], [place]. This information is not intended to replace advice given to you by your health care provider. Make sure you discuss any questions you have with your health care provider.   Sciatica Sciatica is pain, weakness, numbness, or tingling along the path of the sciatic nerve. The nerve starts in the lower back and runs down the back of each leg. The nerve controls the muscles in the lower leg and in the back of the knee, while also providing sensation to the back of the thigh, lower leg, and the sole of your foot. Sciatica is a symptom of another medical condition. For instance, nerve damage or certain conditions, such as a herniated disk or bone spur on the spine, pinch or put pressure on the sciatic nerve. This causes the pain, weakness, or other sensations normally associated with sciatica. Generally, sciatica only affects one side of the body. CAUSES   Herniated or slipped disc.  Degenerative  disk disease.  A pain disorder involving the narrow muscle in the buttocks (piriformis syndrome).  Pelvic injury or fracture.  Pregnancy.  Tumor (rare). SYMPTOMS  Symptoms can vary from mild to very severe. The symptoms usually travel from the low back to the buttocks and down the back of the leg. Symptoms can include:  Mild tingling or dull aches in the lower back, leg, or hip.  Numbness in the back of the calf or sole of the  foot.  Burning sensations in the lower back, leg, or hip.  Sharp pains in the lower back, leg, or hip.  Leg weakness.  Severe back pain inhibiting movement. These symptoms may get worse with coughing, sneezing, laughing, or prolonged sitting or standing. Also, being overweight may worsen symptoms. DIAGNOSIS  Your caregiver will perform a physical exam to look for common symptoms of sciatica. He or she may ask you to do certain movements or activities that would trigger sciatic nerve pain. Other tests may be performed to find the cause of the sciatica. These may include:  Blood tests.  X-rays.  Imaging tests, such as an MRI or CT scan. TREATMENT  Treatment is directed at the cause of the sciatic pain. Sometimes, treatment is not necessary and the pain and discomfort goes away on its own. If treatment is needed, your caregiver may suggest:  Over-the-counter medicines to relieve pain.  Prescription medicines, such as anti-inflammatory medicine, muscle relaxants, or narcotics.  Applying heat or ice to the painful area.  Steroid injections to lessen pain, irritation, and inflammation around the nerve.  Reducing activity during periods of pain.  Exercising and stretching to strengthen your abdomen and improve flexibility of your spine. Your caregiver may suggest losing weight if the extra weight makes the back pain worse.  Physical therapy.  Surgery to eliminate what is pressing or pinching the nerve, such as a bone spur or part of a herniated disk. HOME CARE INSTRUCTIONS   Only take over-the-counter or prescription medicines for pain or discomfort as directed by your caregiver.  Apply ice to the affected area for 20 minutes, 3-4 times a day for the first 48-72 hours. Then try heat in the same way.  Exercise, stretch, or perform your usual activities if these do not aggravate your pain.  Attend physical therapy sessions as directed by your caregiver.  Keep all follow-up  appointments as directed by your caregiver.  Do not wear high heels or shoes that do not provide proper support.  Check your mattress to see if it is too soft. A firm mattress may lessen your pain and discomfort. SEEK IMMEDIATE MEDICAL CARE IF:   You lose control of your bowel or bladder (incontinence).  You have increasing weakness in the lower back, pelvis, buttocks, or legs.  You have redness or swelling of your back.  You have a burning sensation when you urinate.  You have pain that gets worse when you lie down or awakens you at night.  Your pain is worse than you have experienced in the past.  Your pain is lasting longer than 4 weeks.  You are suddenly losing weight without reason. MAKE SURE YOU:  Understand these instructions.  Will watch your condition.  Will get help right away if you are not doing well or get worse. Document Released: 03/30/2001 Document Revised: 10/05/2011 Document Reviewed: 08/15/2011 [facility] Patient Information 2015 [place], [place]. This information is not intended to replace advice given to you by your health care provider. Make sure you discuss  any questions you have with your health care provider.   Back Exercises Back exercises help treat and prevent back injuries. The goal is to increase your strength in your belly (abdominal) and back muscles. These exercises can also help with flexibility. Start these exercises when told by your doctor. HOME CARE Back exercises include: Pelvic Tilt.  Lie on your back with your knees bent. Tilt your pelvis until the lower part of your back is against the floor. Hold this position 5 to 10 sec. Repeat this exercise 5 to 10 times. Knee to Chest.  Pull 1 knee up against your chest and hold for 20 to 30 seconds. Repeat this with the other knee. This may be done with the other leg straight or bent, whichever feels better. Then, pull both knees up against your chest. Sit-Ups or Curl-Ups.  Bend your knees 90  degrees. Start with tilting your pelvis, and do a partial, slow sit-up. Only lift your upper half 30 to 45 degrees off the floor. Take at least 2 to 3 seonds for each sit-up. Do not do sit-ups with your knees out straight. If partial sit-ups are difficult, simply do the above but with only tightening your belly (abdominal) muscles and holding it as told. Hip-Lift.  Lie on your back with your knees flexed 90 degrees. Push down with your feet and shoulders as you raise your hips 2 inches off the floor. Hold for 10 seconds, repeat 5 to 10 times. Back Arches.  Lie on your stomach. Prop yourself up on bent elbows. Slowly press on your hands, causing an arch in your low back. Repeat 3 to 5 times. Shoulder-Lifts.  Lie face down with arms beside your body. Keep hips and belly pressed to floor as you slowly lift your head and shoulders off the floor. Do not overdo your exercises. Be careful in the beginning. Exercises may cause you some mild back discomfort. If the pain lasts for more than 15 minutes, stop the exercises until you see your doctor. Improvement with exercise for back problems is slow.  Document Released: 05/08/2010 Document Revised: 06/28/2011 Document Reviewed: 02/04/2011 [facility] Patient Information 2015 [place], [place]. This information is not intended to replace advice given to you by your health care provider. Make sure you discuss any questions you have with your health care provider.

## 2014-04-22 NOTE — Discharge Summary (Signed)
[facility] Discharge Summary  Patient name: Norma [name] Medical record number: [id number] Date of birth: March 24, 1961 Age: 54 y.o. Gender: female Date of Admission: 04/20/2014  Date of Discharge: 04/22/14 Admitting Physician: [name], MD  Primary Care Provider: Langston Masker, MD Consultants: none  Indication for Hospitalization: L hip pain and inability to ambulate  Discharge Diagnoses/Problem List:  L hip pain - likely 2/2 trochanteric bursitis and sciatica Hypertension Depression GERD  Disposition: home  Discharge Condition: stable  Discharge Exam:  Filed Vitals:   04/22/14 0600  BP: 149/84  Pulse: 84  Temp: 97.9 F (36.6 C)  Resp: 18   General: NAD, pleasant, cooperative, sitting in bedside chair, ambulates with walker for short distances CV: RRR no murmurs Respiratory: CTAB, NWOB Abdomen: soft, NTTP, no masses or organomegaly Extremities: No appreciable lower extremity edema bilaterally. L hip with TTP over greater trochanteric bursa. Full strength RLE. Unable to really assess strength over LLE due to pt c/o pain. Sensation intact to soft touch over bilateral lower extremities. Skin: no rashes noted Neuro: grossly nonfocal, speech normal  Brief Hospital Course:  Norma [name] is a 54 y.o. female presenting with left hip pain. PMH is significant for spinal stenosis, chronic pain, GERD, depression, hypertension.  Patient presenting with L lateral and posterior hip pain, likely multifactorial in etiology. Hip x-ray and CT pelvis without any acute bony findings to explain acute pain. MRI of lumbar spine from December 2015 showed multilevel degenerative changes with spinal stenosis and foraminal encroachment.  Description of pain concerning for sciatica component, and exam findings of TTP over the greater trochanter suggested possible greater trochanteric bursitis. Pain initially treated with IV Dilaudid $RemoveBef'1mg'bQaMihmvUl$  q4h prn and then transitioned to  Oxy IR $Remo'10mg'sDSgo$  q3h prn.  Patient also treated with bedside corticosteroid injection of L greater trochanteric bursa on 1/3.  Flexeril, gabapentin, and tylenol all continued.  ESR and CRP wnl.   PT and OT consulted on admission. Were able to help patient walk with rolling walker. Recommended OP PT and rolling walker.  Patient was stable for discharge on 1/4.  Was discharged with percocet x30 for pain control.  All other chronic medical conditions stable throughout admission and managed with home regimens.  Issues for Follow Up:  - F/u L leg pain - should improve with bursitis injection and ibuprofen (Percocet for breakthrough pain)  Significant Procedures: L trochanteric bursa corticosteroid injection (04/21/14)  Significant Labs and Imaging:   Recent Labs Lab 04/20/14 1442  WBC 7.6  HGB 12.7  HCT 39.9  PLT 189    Recent Labs Lab 04/20/14 1442  NA 138  K 3.5  CL 105  CO2 25  GLUCOSE 101*  BUN 13  CREATININE 0.74  CALCIUM 8.7    ESR 8, CRP <0.5  CT Pelvis 1/2:  No findings to explain the patient's history of left hip pain.  XR Lt Hip 1/2: Ill-defined calcification noted along the proximal hip adductor musculature, likely dystrophic. No acute bony findings.  Results/Tests Pending at Time of Discharge: none  Discharge Medications:    Medication List    STOP taking these medications        acetaminophen 650 MG CR tablet  Commonly known as:  TYLENOL     pregabalin 75 MG capsule  Commonly known as:  LYRICA      TAKE these medications        ARIPiprazole 2 MG tablet  Commonly known as:  ABILIFY  Take 2 mg  by mouth daily.     beta carotene w/minerals tablet  Take 1 tablet by mouth daily.     blood glucose meter kit and supplies  Dispense based on patient and insurance preference. Use up to four times daily as directed. (FOR ICD-9 250.00, 250.01).     buPROPion 300 MG 24 hr tablet  Commonly known as:  WELLBUTRIN XL  Take 300 mg by mouth daily.      cyclobenzaprine 10 MG tablet  Commonly known as:  FLEXERIL  Take 1 tablet (10 mg total) by mouth 3 (three) times daily as needed for muscle spasms.     diclofenac sodium 1 % Gel  Commonly known as:  VOLTAREN  Apply 2 g topically 4 (four) times daily.     diltiazem 60 MG 12 hr capsule  Commonly known as:  CARDIZEM SR  Take 1 capsule (60 mg total) by mouth 2 (two) times daily.     EPINEPHrine 0.3 mg/0.3 mL Soaj injection  Commonly known as:  EPIPEN  Inject 0.3 mLs (0.3 mg total) into the muscle once.     fish oil-omega-3 fatty acids 1000 MG capsule  Take 2 g by mouth daily.     gabapentin 300 MG capsule  Commonly known as:  NEURONTIN  Take 3 capsules (900 mg total) by mouth 3 (three) times daily.     hydrochlorothiazide 25 MG tablet  Commonly known as:  HYDRODIURIL  Take 1 tablet (25 mg total) by mouth daily.     hydrOXYzine 10 MG tablet  Commonly known as:  ATARAX/VISTARIL  Take 10 mg by mouth 3 (three) times daily as needed for itching.     ibuprofen 800 MG tablet  Commonly known as:  ADVIL,MOTRIN  take 1 tablet by mouth every 8 hours if needed     isosorbide mononitrate 30 MG 24 hr tablet  Commonly known as:  IMDUR  Take 30 mg by mouth daily.     metFORMIN 500 MG tablet  Commonly known as:  GLUCOPHAGE  Take 1 tablet (500 mg total) by mouth 2 (two) times daily with a meal.     methocarbamol 500 MG tablet  Commonly known as:  ROBAXIN  Take 500 mg by mouth 4 (four) times daily.     metoprolol tartrate 25 MG tablet  Commonly known as:  LOPRESSOR  Take 1 tablet (25 mg total) by mouth 2 (two) times daily.     milk thistle 175 MG tablet  Take 350 mg by mouth daily.     multivitamin with minerals Tabs tablet  Take 1 tablet by mouth daily.     omeprazole 20 MG capsule  Commonly known as:  PRILOSEC  take 1 capsule by mouth once daily     oxyCODONE-acetaminophen 5-325 MG per tablet  Commonly known as:  ROXICET  Take 1 tablet by mouth every 4 (four) hours as needed  for severe pain.     potassium chloride 10 MEQ tablet  Commonly known as:  K-DUR  Take 10 mEq by mouth 2 (two) times daily.     SUMAtriptan 50 MG tablet  Commonly known as:  IMITREX  Take 1 tablet (50 mg total) by mouth every 2 (two) hours as needed for migraine or headache. Do not exceed 200 mg daily.     traZODone 100 MG tablet  Commonly known as:  DESYREL  Take 2 tablets (200 mg total) by mouth at bedtime.        Discharge Instructions: Please refer to  Patient Instructions section of EMR for full details.  Patient was counseled important signs and symptoms that should prompt return to medical care, changes in medications, dietary instructions, activity restrictions, and follow up appointments.   Follow-Up Appointments: Follow-up Information    Follow up with Beverlyn Roux, MD.   Specialty:  Family Medicine   Why:  Appointment made for hospital follow-up on 05/02/14 at 2:30pm   Contact information:   [place] [address] [phone]       Lavon Paganini, MD 04/22/2014, 4:56 PM PGY-1, [place]

## 2014-04-22 NOTE — Progress Notes (Signed)
Physical Therapy Treatment Patient Details Name: TANIEKA POWNALL MRN: [id number] DOB: 12-05-1960 Today's Date: 04/22/2014    History of Present Illness 54 y.o. female presenting with left hip pain. PMH is significant for spinal stenosis, chronic pain, GERD, depression, hypertension.    PT Comments    Significant progress compared to last session, with pt able to walk, and showing better activity tolerance; Pt expects to be going home today, and is  OK for dc home from PT standpoint; continue to recommend Outpatient PT to address Low Back Pain and hip bursitis (as well as her shoulder); will need a script for Outpatient PT  Will sign off acute PT   Follow Up Recommendations  Outpatient PT;Supervision/Assistance - 24 hour     Equipment Recommendations  Rolling walker with 5" wheels    Recommendations for Other Services       Precautions / Restrictions Precautions Precautions: Fall    Mobility  Bed Mobility                  Transfers Overall transfer level: Needs assistance Equipment used: Rolling walker (2 wheeled) Transfers: Sit to/from Stand Sit to Stand: Supervision         General transfer comment: Cues for technique, and to control descent to sit; managing better  Ambulation/Gait Ambulation/Gait assistance: Supervision Ambulation Distance (Feet): 50 Feet Assistive device: Rolling walker (2 wheeled) Gait Pattern/deviations: Step-to pattern Gait velocity: slowed   General Gait Details: Cues for upright posture and to push down into RW to unweigh painful L hip and low back   Stairs Stairs: Yes Stairs assistance: Min assist Stair Management: One rail Left;Step to pattern;Forwards Number of Stairs: 3 General stair comments: Cues for sequence and technique; Discussed technique to have a friend assist pt   Wheelchair Mobility    Modified Rankin (Stroke Patients Only)       Balance     Sitting balance-Leahy Scale: Fair       Standing  balance-Leahy Scale: Fair                      Cognition Arousal/Alertness: Awake/alert Behavior During Therapy: WFL for tasks assessed/performed Overall Cognitive Status: Within Functional Limits for tasks assessed                      Exercises Other Exercises Other Exercises: Pt educated in and performed figure 4 stretch with 20-30 second hold x 2; Educated pt in the rationale that if the origin of pain is hip bursitis, then stretching the posterolateral musculature of hip can help to take pressure off of bursa, and help with pain    General Comments        Pertinent Vitals/Pain Pain Assessment: 0-10 Pain Score: 5  Pain Location: L hip after pain meds Pain Descriptors / Indicators: Aching Pain Intervention(s): Monitored during session;Repositioned;Premedicated before session;Heat applied    Home Living                      Prior Function            PT Goals (current goals can now be found in the care plan section) Acute Rehab PT Goals Patient Stated Goal: Pt. wants pain managed better prior to d/c. PT Goal Formulation: With patient Time For Goal Achievement: 04/27/14 Potential to Achieve Goals: Good Progress towards PT goals: Progressing toward goals    Frequency  Min 4X/week    PT Plan Current plan remains  appropriate    Co-evaluation             End of Session Equipment Utilized During Treatment: Gait belt Activity Tolerance: Patient tolerated treatment well Patient left: in chair;with call bell/phone within reach;with family/visitor present     Time: 1128-1149 PT Time Calculation (min) (ACUTE ONLY): 21 min  Charges:  $Gait Training: 8-22 mins                    G Codes:  Functional Assessment Tool Used: clinical judgement  Functional Limitation: Mobility: Walking and moving around Mobility: Walking and Moving Around Goal Status [phone]): At least 1 percent but less than 20 percent impaired, limited or restricted Mobility:  Walking and Moving Around Discharge Status [phone]): At least 1 percent but less than 20 percent impaired, limited or restricted   [place] 04/22/2014, 1:20 PM  Roney Marion, [place] Pager [phone] Office [phone]

## 2014-04-22 NOTE — Progress Notes (Signed)
Occupational Therapy Treatment Patient Details Name: Norma [name] MRN: [id number] DOB: 18-Nov-1960 Today's Date: 04/22/2014    History of present illness 54 y.o. female presenting with left hip pain. PMH is significant for spinal stenosis, chronic pain, GERD, depression, hypertension.   OT comments  Pt. Sitting EOB for ADLs is limited by L hip pain. Pt. Standing and transfers are also limited by pain. Pt. Was unwilling to AMB to bathroom secondary to LE pain. Pt. Was willing to work with therapy and does want to improve I with ADLs and mobility. Pt. Ed. On use of AE and DME to increase ability with above.   Follow Up Recommendations   (when OOB/mobility)    Equipment Recommendations  3 in 1 bedside comode;Tub/shower bench    Recommendations for Other Services      Precautions / Restrictions Precautions Precautions: Fall Restrictions Weight Bearing Restrictions: No       Mobility Bed Mobility Overal bed mobility: Modified Independent Bed Mobility: Supine to Sit              Transfers Overall transfer level: Needs assistance   Transfers: Sit to/from Stand Sit to Stand: Min guard Stand pivot transfers: Min guard       General transfer comment:  (cues for walker safety.)    Balance                                   ADL Overall ADL's : Needs assistance/impaired                     Lower Body Dressing: Supervision/safety;With adaptive equipment;Cueing for sequencing   Toilet Transfer: Min guard;Cueing for safety;Cueing for sequencing;Stand-pivot;BSC           Functional mobility during ADLs: Min guard;Rolling walker General ADL Comments:  (ed. pt. on use of AE for LE ADLs.)      Vision                     Perception     Praxis      Cognition   Behavior During Therapy: WFL for tasks assessed/performed Overall Cognitive Status: Within Functional Limits for tasks assessed                        Extremity/Trunk Assessment               Exercises     Shoulder Instructions       General Comments      Pertinent Vitals/ Pain       Pain Assessment: 0-10 Pain Score: 7  Pain Location:  (with WB in supine, standing, and sitting.) Pain Descriptors / Indicators: Sharp;Shooting Pain Intervention(s): Premedicated before session;Repositioned;Ice applied  Home Living                                          Prior Functioning/Environment              Frequency Min 2X/week     Progress Toward Goals  OT Goals(current goals can now be found in the care plan section)     Acute Rehab OT Goals Patient Stated Goal: Pt. wants pain managed better prior to d/c.  Plan      Co-evaluation  End of Session Equipment Utilized During Treatment: Gait belt;Rolling walker   Activity Tolerance Patient limited by pain   Patient Left in chair;with call bell/phone within reach   Nurse Communication          Time: 2440-1027 OT Time Calculation (min): 39 min  Charges: OT General Charges $OT Visit: 1 Procedure OT Treatments $Self Care/Home Management : 23-37 mins $Therapeutic Activity: 8-22 mins  Nealie Mchatton 04/22/2014, 9:10 AM

## 2014-04-22 NOTE — Progress Notes (Signed)
UR completed 

## 2014-04-22 NOTE — Progress Notes (Signed)
CARE MANAGEMENT NOTE 04/22/2014  Patient:  Norma [name],Norma [name]   Account Number:  [id number]  Date Initiated:  04/22/2014  Documentation initiated by:  [facility]  Subjective/Objective Assessment:   possible greater trochanteric bursitis     Action/Plan:   lives at home with husband   Anticipated DC Date:  04/23/2014   Anticipated DC Plan:  [place]  CM consult      Choice offered to / List presented to:     DME arranged  3-N-1  [place]      DME agency  [place].        Status of service:  Completed, signed off Medicare Important Message given?   (If response is "NO", the following Medicare IM given date fields will be blank) Date Medicare IM given:   Medicare IM given by:   Date Additional Medicare IM given:   Additional Medicare IM given by:    Discharge Disposition:  HOME/SELF CARE  Per UR Regulation:    If discussed at Long Length of Stay Meetings, dates discussed:    Comments:  04/22/2013 1300 Pt requesting RW for home. Contacted [facility] for RW with seat for home. Jonnie Finner RN CCM Case Mgmt phone [phone]  04/22/2014 1020 NCM spoke to pt and states she prefers to save her outpt PT benefit for when she has therapy on shoulder. She had rotator cuff surgery in November and had to wait until new year to start therapy so insurance can cover. Explained to pt follow up with her surgeon for outpt PT referral for shoulder. Requesting 3n1 and tub bench for home. Contacted [facility] for DME for home. Jonnie Finner RN CCM Case Mgmt phone [phone]

## 2014-04-22 NOTE — Progress Notes (Signed)
CARE MANAGEMENT NOTE 04/22/2014  Patient:  Norma [name],Norma [name]   Account Number:  [id number]  Date Initiated:  04/22/2014  Documentation initiated by:  [facility]  Subjective/Objective Assessment:   possible greater trochanteric bursitis     Action/Plan:   lives at home with husband   Anticipated DC Date:  04/23/2014   Anticipated DC Plan:  [place]  CM consult      Choice offered to / List presented to:     DME arranged  3-N-1  [facility]      DME agency  [place].        Status of service:  Completed, signed off Medicare Important Message given?   (If response is "NO", the following Medicare IM given date fields will be blank) Date Medicare IM given:   Medicare IM given by:   Date Additional Medicare IM given:   Additional Medicare IM given by:    Discharge Disposition:  HOME/SELF CARE  Per UR Regulation:    If discussed at Long Length of Stay Meetings, dates discussed:    Comments:  04/22/2014 1020 NCM spoke to pt and states she prefers to save her outpt PT benefit for when she has therapy on shoulder. She had rotator cuff surgery in November and had to wait until new year to start therapy so insurance can cover. Explained to pt follow up with her surgeon for outpt PT referral for shoulder. Requesting 3n1 and tub bench for home. Contacted [facility] for DME for home. Jonnie Finner RN CCM Case Mgmt phone [phone]

## 2014-04-24 ENCOUNTER — Telehealth: Payer: Self-pay | Admitting: Family Medicine

## 2014-04-24 NOTE — Telephone Encounter (Signed)
Pt opted to schedule wi appt for tomorrow.

## 2014-04-24 NOTE — Telephone Encounter (Signed)
Pt asking about rx refill on naproxen, says it has been a while since she had it but it helps her with her back.

## 2014-04-25 ENCOUNTER — Encounter: Payer: Self-pay | Admitting: Family Medicine

## 2014-04-25 ENCOUNTER — Ambulatory Visit (INDEPENDENT_AMBULATORY_CARE_PROVIDER_SITE_OTHER): Payer: Medicaid Other | Admitting: Family Medicine

## 2014-04-25 VITALS — BP 146/82 | HR 89 | Temp 98.5°F | Resp 18 | Wt 253.0 lb

## 2014-04-25 DIAGNOSIS — M545 Low back pain, unspecified: Secondary | ICD-10-CM

## 2014-04-25 DIAGNOSIS — M79605 Pain in left leg: Secondary | ICD-10-CM

## 2014-04-25 DIAGNOSIS — M25552 Pain in left hip: Secondary | ICD-10-CM

## 2014-04-25 MED ORDER — KETOROLAC TROMETHAMINE 30 MG/ML IM SOLN: 30.0000 mg | Freq: Once | INTRAMUSCULAR | Status: DC

## 2014-04-25 MED ORDER — KETOROLAC TROMETHAMINE 60 MG/2ML IM SOLN
60.0000 mg | Freq: Once | INTRAMUSCULAR | Status: AC
  Administered 2014-04-25: 60 mg via INTRAMUSCULAR

## 2014-04-25 MED ORDER — NAPROXEN 500 MG PO TABS: 500.0000 mg | ORAL_TABLET | Freq: Two times a day (BID) | ORAL | Status: DC

## 2014-04-25 NOTE — Patient Instructions (Signed)
Keep taking gabapentin three times a day as directed.  The toradol injection today should help with the pain. You should also START taking naproxen daily. DO NOT take ibuprofen, advil, motrin, goody's while taking naproxen.  Follow up with physical therapy.   I hope you feel better soon.

## 2014-04-25 NOTE — Progress Notes (Signed)
Subjective: GLENDENE WYER is a 54 y.o. female presenting for acute on chronic left hip pain.  Pain is located mostly overlaying lateral left hih, 10/10, constant, and worsened by movement, "aching" with radiation down leg to foot consisting of sharp pain and numbness at times. Has been there for years but worse over past 2 months - Continues gabapentin - Been taking percocet, which is helping somewhat. This also helps her left shoulder pain due to rotator cuff tear.  - Attempting to establish with pain clinic but new appointment isn't until March.  - Had injection at [facility] trochenteric bursa several months ago and again during hospitalization recently neither of which seemed to help much.  - Has been taking flexeril and robaxin. This helps "calm the nerves down" when lying down.  - Also uses heating pad.  - Limiting activity greatly. Has a walker with a seat which is very helpful. Uses a cane at times too, which is also helpful.  - Physical therapy has not benefited her in the past, but will be going for left rotator cuff.  - Review of Systems: Pt denies any current bowel/bladder problems, fever, chills, unintentional weight loss, night time awakenings secondary to pain, weakness in one or both legs - Smoking status noted  Objective: BP 146/82 mmHg  Pulse 89  Temp(Src) 98.5 F (36.9 C) (Oral)  Resp 18  Wt 253 lb (114.76 kg)  SpO2 99% Gen:  54 y.o. female in no distress Back:  Normal skin. Spine with normal alignment and no deformity. No tenderness to vertebral process palpation. Paraspinous muscles are not tender and without spasm.   Range of motion is full at lumbar sacral region. Straight leg raise is negative Neuro: Gait antalgic with use of cane. Sensation and motor function 5/5 bilateral lower extremities. Patellar and achilles DTR's 2+  Assessment/Plan: NOELI LAVERY is a 54 y.o. female here for acute on chronic left hip pain.  Doubt trochanteric bursitis as  injections have failed x2.  - Toradol here, NSAID at home daily (Cr nl), continue gabapentin. Refer for PT. Pt is at risk of embarking on chronic narcotic use and so no opioids Rx today.

## 2014-05-02 ENCOUNTER — Ambulatory Visit: Payer: Medicaid Other | Admitting: Family Medicine

## 2014-05-02 ENCOUNTER — Other Ambulatory Visit: Payer: Self-pay | Admitting: Family Medicine

## 2014-05-02 DIAGNOSIS — G894 Chronic pain syndrome: Secondary | ICD-10-CM

## 2014-05-02 NOTE — Telephone Encounter (Signed)
Pt called and needs a refill on her medications. Lyrica, Flexeril, and Robaxin. jw

## 2014-05-03 MED ORDER — CYCLOBENZAPRINE HCL 10 MG PO TABS: 10.0000 mg | ORAL_TABLET | Freq: Three times a day (TID) | ORAL | Status: DC | PRN

## 2014-05-03 NOTE — Telephone Encounter (Signed)
Attempted to call patient to discuss refill needs, per records patient should not need refills for 2 more weeks on Flexeril and Robaxin; Lyrica is not on her medication list (Gabapentin is), will attempt to call patient after the weekend to discuss with her

## 2014-05-03 NOTE — Telephone Encounter (Signed)
LM for patient that rx was sent into pharmacy.  Please assist her when she calls back to schedule an appt. Jazmin Hartsell,CMA

## 2014-05-03 NOTE — Telephone Encounter (Signed)
Patient returned call.  Lyrica - not on current medication record however per patient has been prescribed by previous PCP, in review of discharge summary on 04/22/13 med rec shows that patient is to stop Lyrica, told patient that I will not be able to refill this medication at this time  Flexeril - had 2 months supply given 12/1, should not need refill for 2 weeks, she has 6 pills remaining, will give 30 day supply  Robaxin - patient states that she has not taken this medication for some time, I counseled her that we typically do not prescribe two different muscle relaxants at one time (ie Flexeril and Robaxin). Will need to be seen in office to restart Robaxin  Offered patient appointment which she accepted.  Patient requests referral to [facility], informed patient that if this is a PCP she should not require referral  Note to nursing staff - please call in Flexeril 10 mg PO TID, dispense #90, refill #0; also schedule for follow up

## 2014-05-07 ENCOUNTER — Emergency Department (HOSPITAL_COMMUNITY)
Admission: EM | Admit: 2014-05-07 | Discharge: 2014-05-07 | Disposition: A | Discharge status: Home / Self Care | Payer: Medicaid Other | Attending: Emergency Medicine | Admitting: Emergency Medicine

## 2014-05-07 ENCOUNTER — Ambulatory Visit: Payer: Medicaid Other | Admitting: Family Medicine

## 2014-05-07 ENCOUNTER — Encounter (HOSPITAL_COMMUNITY): Payer: Self-pay | Admitting: *Deleted

## 2014-05-07 DIAGNOSIS — G8929 Other chronic pain: Secondary | ICD-10-CM | POA: Diagnosis not present

## 2014-05-07 DIAGNOSIS — Z79899 Other long term (current) drug therapy: Secondary | ICD-10-CM | POA: Diagnosis not present

## 2014-05-07 DIAGNOSIS — Z791 Long term (current) use of non-steroidal anti-inflammatories (NSAID): Secondary | ICD-10-CM | POA: Diagnosis not present

## 2014-05-07 DIAGNOSIS — R202 Paresthesia of skin: Secondary | ICD-10-CM | POA: Insufficient documentation

## 2014-05-07 DIAGNOSIS — M25552 Pain in left hip: Secondary | ICD-10-CM | POA: Insufficient documentation

## 2014-05-07 DIAGNOSIS — I10 Essential (primary) hypertension: Secondary | ICD-10-CM | POA: Diagnosis not present

## 2014-05-07 DIAGNOSIS — K219 Gastro-esophageal reflux disease without esophagitis: Secondary | ICD-10-CM | POA: Insufficient documentation

## 2014-05-07 MED ORDER — OXYCODONE-ACETAMINOPHEN 5-325 MG PO TABS: 1.0000 | ORAL_TABLET | Freq: Four times a day (QID) | ORAL | Status: DC | PRN

## 2014-05-07 MED ORDER — KETOROLAC TROMETHAMINE 30 MG/ML IJ SOLN
30.0000 mg | Freq: Once | INTRAMUSCULAR | Status: AC
  Administered 2014-05-07: 30 mg via INTRAVENOUS
  Filled 2014-05-07: qty 1

## 2014-05-07 MED ORDER — OXYCODONE-ACETAMINOPHEN 5-325 MG PO TABS
2.0000 | ORAL_TABLET | Freq: Once | ORAL | Status: AC
  Administered 2014-05-07: 2 via ORAL
  Filled 2014-05-07: qty 2

## 2014-05-07 MED ORDER — HYDROCODONE-ACETAMINOPHEN 5-325 MG PO TABS: 2.0000 | ORAL_TABLET | Freq: Once | ORAL | Status: DC

## 2014-05-07 MED ORDER — HYDROMORPHONE HCL 1 MG/ML IJ SOLN
1.0000 mg | Freq: Once | INTRAMUSCULAR | Status: AC
  Administered 2014-05-07: 1 mg via INTRAVENOUS
  Filled 2014-05-07: qty 1

## 2014-05-07 NOTE — ED Notes (Signed)
Pt refused to be placed in a gown RN notified

## 2014-05-07 NOTE — ED Provider Notes (Signed)
CSN: [id number]     Arrival date & time 05/07/14  1105 History   First MD Initiated Contact with Patient 05/07/14 1125     Chief Complaint  Patient presents with  . Hip Pain     (Consider location/radiation/quality/duration/timing/severity/associated sxs/prior Treatment) HPI [name] is a 54 year old female with past medical history of hypertension, back pain, chronic left hip bursitis who presents the ER complaining of ongoing left hip pain. Patient reports she she has been seen and evaluated recently for this pain, diagnosed with bursitis and had follow-up appointment with family medicine today, however she states her pain was too severe. She states she is able to ambulate this morning, however is extremely painful to ambulate. Patient reports associated paresthesias in her leg with a tingling sensation radiating down her leg and into her groin, which she states is also a in ongoing issue with sciatic nerve pain. Patient denies any new back pain, new injury, new hip pain. Patient denies fever, nausea, vomiting, numbness, weakness. Patient denies saddle anesthesia, bowel/bladder incontinence/retention, history of IV drug use or cancer.  Past Medical History  Diagnosis Date  . Hypertension   . Acid reflux   . Tachyarrhythmia   . Left shoulder pain   . Back pain   . Trigger point of thoracic region 03/22/2012  . Irregular heart beat    Past Surgical History  Procedure Laterality Date  . Ankle surgery    . Rotator cuff repair Left 02/20/14   Family History  Problem Relation Age of Onset  . Diabetes Brother   . Diabetes Maternal Aunt   . Diabetes Paternal Aunt   . Diabetes Maternal Grandmother   . Diabetes Maternal Grandfather   . Diabetes Paternal Grandmother   . Diabetes Paternal Grandfather    History  Substance Use Topics  . Smoking status: Never Smoker   . Smokeless tobacco: Never Used  . Alcohol Use: Yes     Comment: occasionally   OB History    No data  available     Review of Systems  Constitutional: Negative for fever.  HENT: Negative for trouble swallowing.   Eyes: Negative for visual disturbance.  Respiratory: Negative for shortness of breath.   Cardiovascular: Negative for chest pain.  Gastrointestinal: Negative for nausea, vomiting and abdominal pain.  Genitourinary: Negative for dysuria.  Musculoskeletal: Negative for neck pain.  Skin: Negative for rash.  Neurological: Negative for dizziness, weakness and numbness.  Psychiatric/Behavioral: Negative.       Allergies  Soma; Nortriptyline; and Prednisone  Home Medications   Prior to Admission medications   Medication Sig Start Date End Date Taking? Authorizing Provider  ARIPiprazole (ABILIFY) 2 MG tablet Take 2 mg by mouth daily.    Historical Provider, MD  beta carotene w/minerals (OCUVITE) tablet Take 1 tablet by mouth daily.    Historical Provider, MD  Blood Glucose Monitoring Suppl (BLOOD GLUCOSE METER KIT AND SUPPLIES) Dispense based on patient and insurance preference. Use up to four times daily as directed. (FOR ICD-9 250.00, 250.01). 01/14/14   [name], MD  buPROPion (WELLBUTRIN XL) 300 MG 24 hr tablet Take 300 mg by mouth daily.    Historical Provider, MD  cyclobenzaprine (FLEXERIL) 10 MG tablet Take 1 tablet (10 mg total) by mouth 3 (three) times daily as needed for muscle spasms. 05/03/14   [name], MD  diclofenac sodium (VOLTAREN) 1 % GEL Apply 2 g topically 4 (four) times daily. 03/29/14   [name], MD  diltiazem (CARDIZEM SR) 60 MG 12 hr capsule Take 1 capsule (60 mg total) by mouth 2 (two) times daily. 05/08/13   Josalyn C Funches, MD  EPINEPHrine (EPIPEN) 0.3 mg/0.3 mL SOAJ Inject 0.3 mLs (0.3 mg total) into the muscle once. 11/02/12   [name], MD  fish oil-omega-3 fatty acids 1000 MG capsule Take 2 g by mouth daily.    Historical Provider, MD  gabapentin (NEURONTIN) 300 MG capsule Take 3 capsules (900 mg total) by mouth 3 (three)  times daily. 11/21/13   Tamela Oddi Hess, DO  hydrochlorothiazide (HYDRODIURIL) 25 MG tablet Take 1 tablet (25 mg total) by mouth daily. 03/19/14   [name], MD  hydrOXYzine (ATARAX/VISTARIL) 10 MG tablet Take 10 mg by mouth 3 (three) times daily as needed for itching.    Historical Provider, MD  isosorbide mononitrate (IMDUR) 30 MG 24 hr tablet Take 30 mg by mouth daily.    Historical Provider, MD  ketorolac (TORADOL) 30 MG/ML injection Inject 1 mL (30 mg total) into the muscle once. 04/25/14   Patrecia Pour, MD  metFORMIN (GLUCOPHAGE) 500 MG tablet Take 1 tablet (500 mg total) by mouth 2 (two) times daily with a meal. 03/19/14   [name], MD  methocarbamol (ROBAXIN) 500 MG tablet Take 500 mg by mouth 4 (four) times daily.    Historical Provider, MD  metoprolol tartrate (LOPRESSOR) 25 MG tablet Take 1 tablet (25 mg total) by mouth 2 (two) times daily. 01/14/14   [name], MD  milk thistle 175 MG tablet Take 350 mg by mouth daily.    Historical Provider, MD  Multiple Vitamin (MULTIVITAMIN WITH MINERALS) TABS tablet Take 1 tablet by mouth daily.    Historical Provider, MD  naproxen (NAPROSYN) 500 MG tablet Take 1 tablet (500 mg total) by mouth 2 (two) times daily with a meal. 04/25/14   Patrecia Pour, MD  omeprazole (PRILOSEC) 20 MG capsule take 1 capsule by mouth once daily    [name], MD  oxyCODONE-acetaminophen (PERCOCET) 5-325 MG per tablet Take 1-2 tablets by mouth every 6 (six) hours as needed. 05/07/14   Carrie Mew, PA-C  potassium chloride (K-DUR) 10 MEQ tablet Take 10 mEq by mouth 2 (two) times daily.    Historical Provider, MD  SUMAtriptan (IMITREX) 50 MG tablet Take 1 tablet (50 mg total) by mouth every 2 (two) hours as needed for migraine or headache. Do not exceed 200 mg daily. 06/20/13   Coral Spikes, DO  traZODone (DESYREL) 100 MG tablet Take 2 tablets (200 mg total) by mouth at bedtime. 03/19/14   [name], MD   BP 137/75 mmHg  Pulse 93  Temp(Src) 97.3 F  (36.3 C) (Oral)  Resp 22  SpO2 99% Physical Exam  Constitutional: She is oriented to person, place, and time. She appears well-developed and well-nourished. No distress.  HENT:  Head: Normocephalic and atraumatic.  Mouth/Throat: Oropharynx is clear and moist. No oropharyngeal exudate.  Eyes: Right eye exhibits no discharge. Left eye exhibits no discharge. No scleral icterus.  Neck: Normal range of motion.  Cardiovascular: Normal rate, regular rhythm and normal heart sounds.   No murmur heard. Pulmonary/Chest: Effort normal and breath sounds normal. No respiratory distress.  Abdominal: Soft. There is no tenderness.  Musculoskeletal: Normal range of motion. She exhibits no edema or tenderness.  Left hip exam: No erythema, edema, warmth. Mild pain with range of motion with flexion and abduction. Circumduction Notes elicits  no pain. Pain mostly with bearing weight on hip. Patient has full active and passive range of motion. 5 out of 5 motor strength at hip, knee, ankle. Distal sensation intact. DP pulse 2+.  Neurological: She is alert and oriented to person, place, and time. She has normal strength. No cranial nerve deficit or sensory deficit. Coordination and gait normal. GCS eye subscore is 4. GCS verbal subscore is 5. GCS motor subscore is 6.  Patient fully alert, answering questions appropriately in full, clear sentences. Cranial nerves II through XII grossly intact. Motor strength 5 out of 5 in all major muscle degrees peripheral extremity. Distal sensation intact.   Skin: Skin is warm and dry. No rash noted. She is not diaphoretic.  Psychiatric: She has a normal mood and affect.  Nursing note and vitals reviewed.   ED Course  Procedures (including critical care time) Labs Review Labs Reviewed - No data to display  Imaging Review No results found.   EKG Interpretation None      MDM   Final diagnoses:  Chronic hip pain, left    Patient here with chronic hip pain consistent  with pain she's been experiencing as an acute on chronic pain for the past several weeks. Patient states she feels she had a flare up this morning, and felt her pain was too severe to go to her PCP appointment.. Patient's pain treated here. No indication for further imaging due to the fact that this is a chronic pain, and an ongoing pain. Patient states she has follow-up with pain clinic in March, states she was unable to wait until that time. Patient able to ambulate well with painful ambulation. No concern for worsening of symptoms, neuro exam benign without deficit. No concern for cauda equina. Patient afebrile, non-tachycardic, nontachypneic, non-hypoxic. No concern for septic arthritis. Patient discharged and given return precautions. Patient strongly encouraged follow-up with her primary care doctor. Patient verbalizes understanding and agreement of this plan. I encouraged patient to call or return to ER with any worsening symptoms or should she have any questions or concerns.  BP 137/75 mmHg  Pulse 93  Temp(Src) 97.3 F (36.3 C) (Oral)  Resp 22  SpO2 99%  Signed,  Dahlia Bailiff, PA-C 4:09 PM  Patient seen and discussed with Dr. Veryl Speak, MD   Carrie Mew, PA-C 05/07/14 [place], MD 05/07/14 [phone]

## 2014-05-07 NOTE — ED Notes (Signed)
PA-C at bedside 

## 2014-05-07 NOTE — ED Notes (Signed)
Pt. Refuses to walk at present. Hurts to stand. She states, "i will walk when ride gets here; I can walk to w/c and to my car all at once; not this getting up and down." Dr. Stark Jock made aware.

## 2014-05-07 NOTE — ED Notes (Signed)
C/o left hip pain - sciatic and bursitis; d/c from here on 04/21/2014. During stay pt. Received steroid injection, therapy, and pain medications. Walked good all last week. Taking flexeril, robaxin, etc. For pain. Did not take meds today. Needs to go to pain clinic, but they cannot get her in till march 2016. EMS gave toradol iv enroute. Took edge off somewhat.

## 2014-05-07 NOTE — Discharge Instructions (Signed)
Hip Pain Your hip is the joint between your upper legs and your lower pelvis. The bones, cartilage, tendons, and muscles of your hip joint perform a lot of work each day supporting your body weight and allowing you to move around. Hip pain can range from a minor ache to severe pain in one or both of your hips. Pain may be felt on the inside of the hip joint near the groin, or the outside near the buttocks and upper thigh. You may have swelling or stiffness as well.  HOME CARE INSTRUCTIONS   Take medicines only as directed by your health care provider.  Apply ice to the injured area:  Put ice in a plastic bag.  Place a towel between your skin and the bag.  Leave the ice on for 15-20 minutes at a time, 3-4 times a day.  Keep your leg raised (elevated) when possible to lessen swelling.  Avoid activities that cause pain.  Follow specific exercises as directed by your health care provider.  Sleep with a pillow between your legs on your most comfortable side.  Record how often you have hip pain, the location of the pain, and what it feels like. SEEK MEDICAL CARE IF:   You are unable to put weight on your leg.  Your hip is red or swollen or very tender to touch.  Your pain or swelling continues or worsens after 1 week.  You have increasing difficulty walking.  You have a fever. SEEK IMMEDIATE MEDICAL CARE IF:   You have fallen.  You have a sudden increase in pain and swelling in your hip. MAKE SURE YOU:   Understand these instructions.  Will watch your condition.  Will get help right away if you are not doing well or get worse. Document Released: 09/23/2009 Document Revised: 08/20/2013 Document Reviewed: 11/30/2012 [facility] Patient Information 2015 [employer], [place]. This information is not intended to replace advice given to you by your health care provider. Make sure you discuss any questions you have with your health care provider.  Hip Bursitis Bursitis is a swelling and  soreness (inflammation) of a fluid-filled sac (bursa). This sac overlies and protects the joints.  CAUSES   Injury.  Overuse of the muscles surrounding the joint.  Arthritis.  Gout.  Infection.  Cold weather.  Inadequate warm-up and conditioning prior to activities. The cause may not be known.  SYMPTOMS   Mild to severe irritation.  Tenderness and swelling over the outside of the hip.  Pain with motion of the hip.  If the bursa becomes infected, a fever may be present. Redness, tenderness, and warmth will develop over the hip. Symptoms usually lessen in 3 to 4 weeks with treatment, but can come back. TREATMENT If conservative treatment does not work, your caregiver may advise draining the bursa and injecting cortisone into the area. This may speed up the healing process. This may also be used as an initial treatment of choice. HOME CARE INSTRUCTIONS   Apply ice to the affected area for 15-20 minutes every 3 to 4 hours while awake for the first 2 days. Put the ice in a plastic bag and place a towel between the bag of ice and your skin.  Rest the painful joint as much as possible, but continue to put the joint through a normal range of motion at least 4 times per day. When the pain lessens, begin normal, slow movements and usual activities to help prevent stiffness of the hip.  Only take over-the-counter or  prescription medicines for pain, discomfort, or fever as directed by your caregiver.  Use crutches to limit weight bearing on the hip joint, if advised.  Elevate your painful hip to reduce swelling. Use pillows for propping and cushioning your legs and hips.  Gentle massage may provide comfort and decrease swelling. SEEK IMMEDIATE MEDICAL CARE IF:   Your pain increases even during treatment, or you are not improving.  You have a fever.  You have heat and inflammation over the involved bursa.  You have any other questions or concerns. MAKE SURE YOU:   Understand  these instructions.  Will watch your condition.  Will get help right away if you are not doing well or get worse. Document Released: 09/25/2001 Document Revised: 06/28/2011 Document Reviewed: 04/24/2008 [facility] Patient Information 2015 [place], [place]. This information is not intended to replace advice given to you by your health care provider. Make sure you discuss any questions you have with your health care provider.

## 2014-05-08 ENCOUNTER — Ambulatory Visit (INDEPENDENT_AMBULATORY_CARE_PROVIDER_SITE_OTHER): Payer: Medicaid Other | Admitting: Family Medicine

## 2014-05-08 ENCOUNTER — Ambulatory Visit: Payer: Medicaid Other | Admitting: Family Medicine

## 2014-05-08 ENCOUNTER — Encounter: Payer: Self-pay | Admitting: Family Medicine

## 2014-05-08 VITALS — BP 143/83 | HR 97 | Temp 98.3°F | Wt 253.0 lb

## 2014-05-08 DIAGNOSIS — M25552 Pain in left hip: Secondary | ICD-10-CM

## 2014-05-08 MED ORDER — KETOROLAC TROMETHAMINE 30 MG/ML IJ SOLN
30.0000 mg | Freq: Once | INTRAMUSCULAR | Status: AC
  Administered 2014-05-08: 30 mg via INTRAMUSCULAR

## 2014-05-08 NOTE — Patient Instructions (Signed)
Nice to see you. Your pain in your hip is likely related to your spinal stenosis and arthritis in your back. Please call your orthopedist for follow-up of this issue. Please only take the flexeril and lyrica for your pain.  If you develop numbness, weakness, loss of bowel or bladder function, or fever please seek medical attention.

## 2014-05-09 ENCOUNTER — Telehealth: Payer: Self-pay | Admitting: Family Medicine

## 2014-05-09 DIAGNOSIS — M48061 Spinal stenosis, lumbar region without neurogenic claudication: Secondary | ICD-10-CM

## 2014-05-09 NOTE — Telephone Encounter (Signed)
Referral order placed.

## 2014-05-09 NOTE — Telephone Encounter (Signed)
Will forward to MD.  Pt would like to see a different ortho. Jazmin Hartsell,CMA

## 2014-05-09 NOTE — Telephone Encounter (Signed)
Pt called and would like to get a referral to a Ortho doctor . jw

## 2014-05-09 NOTE — Progress Notes (Signed)
Patient ID: Norma [name], female   DOB: 29-Oct-1960, 54 y.o.   MRN: [id number]  Norma Rumps, MD Phone: [phone]  Norma [name] is a 54 y.o. female who presents today for f/u.  Left hip and leg pain: notes she has continued to have pain despite trochanteric bursa injections. She notes now has intermittent numbness in her left leg and intermittent numbness between her legs. She states this started to get worse 2.5 months ago. She notes she has pain while walking. She is taking lyrica, gabapentin, flexeril, and robaxin for this. She notes she gets drowsy with all these medications. She denies urinary and bowel incontinence, fevers, and history of cancer. She denies LE weakness. She has known multi level degenerative changes in the lower 3 levels of her lumbar spine resulting in spinal stenosis and foraminal encroachment. She has been seen by Dr Berenice Primas of [facility] for this issue.  Patient is a nonsmoker.    ROS: Per HPI   Physical Exam Filed Vitals:   05/08/14 1554  BP: 143/83  Pulse: 97  Temp: 98.3 F (36.8 C)    Gen: Well NAD HEENT: PERRL,  MMM Lungs: CTABL Nl WOB Heart: RRR  MSK: no midline tenderness to palpation of the length of her spine, there is no swelling evident in her back, there is no tenderness noted on palpation of either hip or trochanteric area, she has a positive straight leg raise on the left Neuro: 5/5 strength in quads, hamstrings, plantar and dorsiflexion, sensation to light touch intact in right LE, states left leg feels slightly diminished compared to the right with light touch, normal gait, 2+ patellar reflexes, states light touch sensation over the inferior portion of her labia is different, though has normal sensation around her rectum and has normal rectal tone Exts: Non edematous BL  LE, warm and well perfused.    Assessment/Plan: Please see individual problem list.  Norma Rumps, MD [facility] PGY-3

## 2014-05-09 NOTE — Assessment & Plan Note (Addendum)
Patient continues to have issues with pain in her left hip and now has intermittent sensory changes in the left leg. She does have sensory change at labia, though has normal sensation around rectum and normal rectal tone along with no change in bowel or bladder function makes a cauda equina syndrome unlikely. She has no weakness in her LE making emergent/urgent evaluation unnecessary at this time. 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 She is followed by ortho, though she reports she has declined surgical intervention in the past. Given progression of symptoms surgical intervention at some point in the future may be the best option if it is possible. I discussed the options of having her see her orthopedist vs second opinion vs neurosurgery for this issue and she decided on going back to her orthopedist. Nursing called to schedule an appointment for her and they were advised that "the patient has unfinished business" at the ortho practice. She is to call them to set up follow-up. The decision was made by the patient to stick with lyrica and flexeril for her pain, as opposed to 4 medications given her drowsiness. She was given toradol 30 mg IM in the office for pain. I advised the patient that if she were to develop weakness, change in bowel or bladder habits, numbness of her saddle area, or fevers she would need to go to the ED right away. She voiced understanding.  Precepted with Dr Ree Kida

## 2014-05-11 ENCOUNTER — Telehealth: Payer: Self-pay | Admitting: Family Medicine

## 2014-05-11 NOTE — Telephone Encounter (Signed)
Emergency Line Call  Pt complains of burning and shooting pain down her leg on the left side; she has known left-sided sciatica. She has been taking Flexeril and Lyrica without much help. She uses a walker to walk, which allows her to walk, but it is very painful. She denies loss of bowel / bladder control, fever, N/V, abdominal pain, or other systemic symptoms. She has also been using Tylenol and ice without much relief.  Advised her that she can try ibuprofen and / or heat, continue Tylenol and her prescription medications,and see how her symptoms do with this. Advised her to call Monday for a same-day appointment if able, or to come into the ED this weekend if her pain is worse or if she develops systemic symptoms or bowel / bladder dysfunction, etc. Pt voiced understanding.  Norma Kluver, MD PGY-3, [facility] 05/11/2014, 1:17 PM

## 2014-05-13 ENCOUNTER — Telehealth: Payer: Self-pay | Admitting: Family Medicine

## 2014-05-13 ENCOUNTER — Other Ambulatory Visit: Payer: Self-pay | Admitting: Family Medicine

## 2014-05-13 DIAGNOSIS — I10 Essential (primary) hypertension: Secondary | ICD-10-CM

## 2014-05-13 MED ORDER — HYDROCHLOROTHIAZIDE 25 MG PO TABS: 25.0000 mg | ORAL_TABLET | Freq: Every day | ORAL | Status: DC

## 2014-05-13 MED ORDER — METOPROLOL TARTRATE 25 MG PO TABS: 25.0000 mg | ORAL_TABLET | Freq: Two times a day (BID) | ORAL | Status: DC

## 2014-05-13 NOTE — Telephone Encounter (Signed)
Referral made to [facility].  Pt is aware of appt on 05/14/14 at 2pm with Dr. Alfonso Ramus.  Informed her that they offer an afternoon urgent care if she can't wait til her appt. Dainel Arcidiacono,CMA

## 2014-05-13 NOTE — Telephone Encounter (Signed)
Note to nursing staff - please call patient and inform her that Dr. Skeet Simmer wrote prescriptions with enough refills for a year on both of these medications within the past few months, she needs to contact her pharmacy for refill, thanks

## 2014-05-13 NOTE — Telephone Encounter (Signed)
Will forward to MD. Jazmin Hartsell,CMA  

## 2014-05-13 NOTE — Telephone Encounter (Signed)
Pt called again. The pain is unbearable.   She says she cannot get up out of the bed.  She needs to know what to do----she needs the referral to the orthopaedic done ASAP.  Her account at [facility] is blocked because she owes $13.  She needs another ortho. Please advise

## 2014-05-13 NOTE — Telephone Encounter (Signed)
Pt called because her BO medication needs to be sent to [facility] on [address]. jw

## 2014-05-13 NOTE — Telephone Encounter (Signed)
If she is in that much pain she should be seen in clinic, at an urgent care, or in the ED. Please inform her of this. I placed the ortho referral last week.

## 2014-05-13 NOTE — Telephone Encounter (Signed)
Pt called and needs a refill on her BP medication called in to the [facility] on [address]. jw

## 2014-05-13 NOTE — Addendum Note (Signed)
Addended by: [name] on: 05/13/2014 04:17 PM   Modules accepted: Orders

## 2014-05-13 NOTE — Telephone Encounter (Signed)
Refills for HCTZ and Metoprolol sent to pharmacy.

## 2014-05-14 ENCOUNTER — Other Ambulatory Visit: Payer: Self-pay | Admitting: *Deleted

## 2014-05-14 NOTE — Telephone Encounter (Signed)
Unclear from message which medication the patient is referring to. Attempted to contact patient. No answer. Left message that I will return call tomorrow, however patient can call office to clarify which medication she needs refilled.

## 2014-05-14 NOTE — Telephone Encounter (Signed)
Patient needed refills on HTN meds, refills sent yesterday.

## 2014-05-15 MED ORDER — IBUPROFEN 800 MG PO TABS: 800.0000 mg | ORAL_TABLET | Freq: Three times a day (TID) | ORAL | Status: DC | PRN

## 2014-05-22 ENCOUNTER — Other Ambulatory Visit (HOSPITAL_COMMUNITY): Payer: Self-pay | Admitting: Neurosurgery

## 2014-05-30 ENCOUNTER — Ambulatory Visit: Payer: Medicaid Other | Admitting: Family Medicine

## 2014-05-30 ENCOUNTER — Encounter (HOSPITAL_COMMUNITY): Payer: Self-pay | Admitting: Pharmacy Technician

## 2014-06-04 ENCOUNTER — Encounter: Payer: Self-pay | Admitting: Physical Medicine & Rehabilitation

## 2014-06-04 ENCOUNTER — Encounter (HOSPITAL_COMMUNITY)
Admission: RE | Admit: 2014-06-04 | Discharge: 2014-06-04 | Disposition: A | Discharge status: Home / Self Care | Payer: Medicaid Other | Source: Ambulatory Visit | Attending: Neurosurgery | Admitting: Neurosurgery

## 2014-06-04 ENCOUNTER — Encounter (HOSPITAL_COMMUNITY): Payer: Self-pay

## 2014-06-04 DIAGNOSIS — M5116 Intervertebral disc disorders with radiculopathy, lumbar region: Secondary | ICD-10-CM | POA: Diagnosis not present

## 2014-06-04 DIAGNOSIS — F419 Anxiety disorder, unspecified: Secondary | ICD-10-CM | POA: Diagnosis not present

## 2014-06-04 DIAGNOSIS — F329 Major depressive disorder, single episode, unspecified: Secondary | ICD-10-CM | POA: Diagnosis not present

## 2014-06-04 DIAGNOSIS — M5127 Other intervertebral disc displacement, lumbosacral region: Secondary | ICD-10-CM | POA: Diagnosis not present

## 2014-06-04 DIAGNOSIS — Z6837 Body mass index (BMI) 37.0-37.9, adult: Secondary | ICD-10-CM | POA: Diagnosis not present

## 2014-06-04 DIAGNOSIS — M1990 Unspecified osteoarthritis, unspecified site: Secondary | ICD-10-CM | POA: Diagnosis not present

## 2014-06-04 DIAGNOSIS — E119 Type 2 diabetes mellitus without complications: Secondary | ICD-10-CM | POA: Diagnosis not present

## 2014-06-04 DIAGNOSIS — K219 Gastro-esophageal reflux disease without esophagitis: Secondary | ICD-10-CM | POA: Diagnosis not present

## 2014-06-04 DIAGNOSIS — I10 Essential (primary) hypertension: Secondary | ICD-10-CM | POA: Diagnosis not present

## 2014-06-04 HISTORY — DX: Major depressive disorder, single episode, unspecified: F32.9

## 2014-06-04 HISTORY — DX: Headache, unspecified: R51.9

## 2014-06-04 HISTORY — DX: Unspecified osteoarthritis, unspecified site: M19.90

## 2014-06-04 HISTORY — DX: Depression, unspecified: F32.A

## 2014-06-04 HISTORY — DX: Type 2 diabetes mellitus without complications: E11.9

## 2014-06-04 HISTORY — DX: Reserved for inherently not codable concepts without codable children: IMO0001

## 2014-06-04 HISTORY — DX: Anxiety disorder, unspecified: F41.9

## 2014-06-04 HISTORY — DX: Headache: R51

## 2014-06-04 LAB — SURGICAL PCR SCREEN
MRSA, PCR: NEGATIVE
Staphylococcus aureus: NEGATIVE

## 2014-06-04 LAB — BASIC METABOLIC PANEL
Anion gap: 9 (ref 5–15)
BUN: 10 mg/dL (ref 6–23)
CALCIUM: 9.4 mg/dL (ref 8.4–10.5)
CO2: 25 mmol/L (ref 19–32)
Chloride: 106 mmol/L (ref 96–112)
Creatinine, Ser: 0.99 mg/dL (ref 0.50–1.10)
GFR, EST AFRICAN AMERICAN: 73 mL/min — AB (ref >90)
GFR, EST NON AFRICAN AMERICAN: 63 mL/min — AB (ref >90)
Glucose, Bld: 98 mg/dL (ref 70–99)
Potassium: 3.8 mmol/L (ref 3.5–5.1)
Sodium: 140 mmol/L (ref 135–145)

## 2014-06-04 LAB — CBC
HCT: 38.1 % (ref 36.0–46.0)
Hemoglobin: 12.4 g/dL (ref 12.0–15.0)
MCH: 27.4 pg (ref 26.0–34.0)
MCHC: 32.5 g/dL (ref 30.0–36.0)
MCV: 84.3 fL (ref 78.0–100.0)
Platelets: 226 10*3/uL (ref 150–400)
RBC: 4.52 MIL/uL (ref 3.87–5.11)
RDW: 14.5 % (ref 11.5–15.5)
WBC: 5.3 10*3/uL (ref 4.0–10.5)

## 2014-06-04 NOTE — Pre-Procedure Instructions (Signed)
JADASIA HAWS  [birth year]   Your procedure is scheduled on: Thursday, Feb. 18th   Report to [facility] Admitting at  11:00 AM.   Call this number if you have problems the morning of surgery: [phone]   Remember:   Do not eat food or drink liquids after midnight Wednesday.    Take these medicines the morning of surgery with A SIP OF WATER: Celexa, Valium, Isosorbide, Metoprolol, Omeprazole, Oxycodone.              Do NOT take any diabetes medication the morning of surgery.  Please stop taking any blood thinners, anti-inflammatories 4-5 days prior to surgery.   Do not wear jewelry, make-up or nail polish.  Do not wear lotions, powders, or perfumes. You may NOT  wear deodorant day of surgery.  Do not shave underarms & legs 48 hours prior to surgery.    Do not bring valuables to the hospital.  [facility] is not responsible for any belongings or valuables.               Contacts, dentures or bridgework may not be worn into surgery.  Leave suitcase in the car. After surgery it may be brought to your room.  For patients admitted to the hospital, discharge time is determined by your treatment team.    Name and phone number of your driver:    Special Instructions: "Preparing for Surgery" instruction sheet.   Please read over the following fact sheets that you were given: Pain Booklet, Coughing and Deep Breathing, MRSA Information and Surgical Site Infection Prevention

## 2014-06-05 MED ORDER — CEFAZOLIN SODIUM-DEXTROSE 2-3 GM-% IV SOLR
2.0000 g | INTRAVENOUS | Status: AC
  Administered 2014-06-06: 2 g via INTRAVENOUS
  Filled 2014-06-05: qty 50

## 2014-06-06 ENCOUNTER — Ambulatory Visit (HOSPITAL_COMMUNITY): Payer: Medicaid Other | Admitting: Anesthesiology

## 2014-06-06 ENCOUNTER — Encounter (HOSPITAL_COMMUNITY): Payer: Self-pay | Admitting: *Deleted

## 2014-06-06 ENCOUNTER — Encounter (HOSPITAL_COMMUNITY)
Admission: RE | Disposition: A | Discharge status: Home / Self Care | Payer: Self-pay | Source: Ambulatory Visit | Attending: Neurosurgery

## 2014-06-06 ENCOUNTER — Ambulatory Visit (HOSPITAL_COMMUNITY)
Admission: RE | Admit: 2014-06-06 | Discharge: 2014-06-07 | Disposition: A | Discharge status: Home / Self Care | Payer: Medicaid Other | Source: Ambulatory Visit | Attending: Neurosurgery | Admitting: Neurosurgery

## 2014-06-06 ENCOUNTER — Ambulatory Visit (HOSPITAL_COMMUNITY): Payer: Medicaid Other

## 2014-06-06 DIAGNOSIS — M5127 Other intervertebral disc displacement, lumbosacral region: Secondary | ICD-10-CM | POA: Insufficient documentation

## 2014-06-06 DIAGNOSIS — M1990 Unspecified osteoarthritis, unspecified site: Secondary | ICD-10-CM | POA: Insufficient documentation

## 2014-06-06 DIAGNOSIS — Z6837 Body mass index (BMI) 37.0-37.9, adult: Secondary | ICD-10-CM | POA: Insufficient documentation

## 2014-06-06 DIAGNOSIS — M5116 Intervertebral disc disorders with radiculopathy, lumbar region: Secondary | ICD-10-CM | POA: Insufficient documentation

## 2014-06-06 DIAGNOSIS — I10 Essential (primary) hypertension: Secondary | ICD-10-CM | POA: Insufficient documentation

## 2014-06-06 DIAGNOSIS — F419 Anxiety disorder, unspecified: Secondary | ICD-10-CM | POA: Insufficient documentation

## 2014-06-06 DIAGNOSIS — F329 Major depressive disorder, single episode, unspecified: Secondary | ICD-10-CM | POA: Insufficient documentation

## 2014-06-06 DIAGNOSIS — K219 Gastro-esophageal reflux disease without esophagitis: Secondary | ICD-10-CM | POA: Insufficient documentation

## 2014-06-06 DIAGNOSIS — M5126 Other intervertebral disc displacement, lumbar region: Secondary | ICD-10-CM | POA: Diagnosis present

## 2014-06-06 DIAGNOSIS — E119 Type 2 diabetes mellitus without complications: Secondary | ICD-10-CM | POA: Insufficient documentation

## 2014-06-06 HISTORY — PX: LUMBAR LAMINECTOMY/DECOMPRESSION MICRODISCECTOMY: SHX5026

## 2014-06-06 LAB — GLUCOSE, CAPILLARY
Glucose-Capillary: 108 mg/dL — ABNORMAL HIGH (ref 70–99)
Glucose-Capillary: 128 mg/dL — ABNORMAL HIGH (ref 70–99)

## 2014-06-06 SURGERY — LUMBAR LAMINECTOMY/DECOMPRESSION MICRODISCECTOMY 1 LEVEL
Anesthesia: General | Laterality: Left

## 2014-06-06 MED ORDER — PANTOPRAZOLE SODIUM 40 MG PO TBEC
40.0000 mg | DELAYED_RELEASE_TABLET | Freq: Every day | ORAL | Status: DC
  Administered 2014-06-06 – 2014-06-07 (×2): 40 mg via ORAL
  Filled 2014-06-06 (×2): qty 1

## 2014-06-06 MED ORDER — PREGABALIN 75 MG PO CAPS
75.0000 mg | ORAL_CAPSULE | Freq: Two times a day (BID) | ORAL | Status: DC
  Administered 2014-06-06 – 2014-06-07 (×2): 75 mg via ORAL
  Filled 2014-06-06 (×2): qty 1

## 2014-06-06 MED ORDER — ACETAMINOPHEN 325 MG PO TABS: 650.0000 mg | ORAL_TABLET | ORAL | Status: DC | PRN

## 2014-06-06 MED ORDER — GLYCOPYRROLATE 0.2 MG/ML IJ SOLN
INTRAMUSCULAR | Status: DC | PRN
  Administered 2014-06-06: 0.4 mg via INTRAVENOUS

## 2014-06-06 MED ORDER — METOPROLOL TARTRATE 25 MG PO TABS
25.0000 mg | ORAL_TABLET | Freq: Two times a day (BID) | ORAL | Status: DC
  Administered 2014-06-06 – 2014-06-07 (×2): 25 mg via ORAL
  Filled 2014-06-06 (×2): qty 1

## 2014-06-06 MED ORDER — METFORMIN HCL 500 MG PO TABS
500.0000 mg | ORAL_TABLET | Freq: Two times a day (BID) | ORAL | Status: DC
  Administered 2014-06-07: 500 mg via ORAL
  Filled 2014-06-06: qty 1

## 2014-06-06 MED ORDER — MIDAZOLAM HCL 5 MG/5ML IJ SOLN
INTRAMUSCULAR | Status: DC | PRN
  Administered 2014-06-06 (×2): 1 mg via INTRAVENOUS

## 2014-06-06 MED ORDER — BUPROPION HCL ER (XL) 150 MG PO TB24
300.0000 mg | ORAL_TABLET | Freq: Every day | ORAL | Status: DC
  Administered 2014-06-06 – 2014-06-07 (×2): 300 mg via ORAL
  Filled 2014-06-06 (×2): qty 2

## 2014-06-06 MED ORDER — ROCURONIUM BROMIDE 100 MG/10ML IV SOLN
INTRAVENOUS | Status: DC | PRN
  Administered 2014-06-06: 50 mg via INTRAVENOUS

## 2014-06-06 MED ORDER — SUCCINYLCHOLINE CHLORIDE 20 MG/ML IJ SOLN
INTRAMUSCULAR | Status: DC | PRN
  Administered 2014-06-06: 120 mg via INTRAVENOUS

## 2014-06-06 MED ORDER — DEXAMETHASONE SODIUM PHOSPHATE 4 MG/ML IJ SOLN
INTRAMUSCULAR | Status: AC
  Filled 2014-06-06: qty 1

## 2014-06-06 MED ORDER — LACTATED RINGERS IV SOLN
INTRAVENOUS | Status: DC
  Administered 2014-06-06: 19:00:00 via INTRAVENOUS

## 2014-06-06 MED ORDER — 0.9 % SODIUM CHLORIDE (POUR BTL) OPTIME
TOPICAL | Status: DC | PRN
  Administered 2014-06-06: 1000 mL

## 2014-06-06 MED ORDER — ARIPIPRAZOLE 2 MG PO TABS
2.0000 mg | ORAL_TABLET | Freq: Every day | ORAL | Status: DC
  Administered 2014-06-06 – 2014-06-07 (×2): 2 mg via ORAL
  Filled 2014-06-06 (×2): qty 1

## 2014-06-06 MED ORDER — FENTANYL CITRATE 0.05 MG/ML IJ SOLN
INTRAMUSCULAR | Status: DC | PRN
  Administered 2014-06-06 (×5): 50 ug via INTRAVENOUS

## 2014-06-06 MED ORDER — SUMATRIPTAN SUCCINATE 50 MG PO TABS
50.0000 mg | ORAL_TABLET | ORAL | Status: DC | PRN
  Filled 2014-06-06: qty 1

## 2014-06-06 MED ORDER — THROMBIN 5000 UNITS EX SOLR
CUTANEOUS | Status: DC | PRN
  Administered 2014-06-06: 5000 [IU] via TOPICAL

## 2014-06-06 MED ORDER — ALUM & MAG HYDROXIDE-SIMETH 200-200-20 MG/5ML PO SUSP: 30.0000 mL | Freq: Four times a day (QID) | ORAL | Status: DC | PRN

## 2014-06-06 MED ORDER — PROPOFOL 10 MG/ML IV BOLUS
INTRAVENOUS | Status: DC | PRN
  Administered 2014-06-06: 150 mg via INTRAVENOUS

## 2014-06-06 MED ORDER — HYDROMORPHONE HCL 1 MG/ML IJ SOLN
0.2500 mg | INTRAMUSCULAR | Status: DC | PRN
  Administered 2014-06-06 (×3): 0.5 mg via INTRAVENOUS

## 2014-06-06 MED ORDER — LIDOCAINE HCL (CARDIAC) 20 MG/ML IV SOLN
INTRAVENOUS | Status: DC | PRN
  Administered 2014-06-06: 60 mg via INTRAVENOUS

## 2014-06-06 MED ORDER — OCUVITE PO TABS
1.0000 | ORAL_TABLET | Freq: Every day | ORAL | Status: DC
  Filled 2014-06-06: qty 1

## 2014-06-06 MED ORDER — PHENYLEPHRINE 40 MCG/ML (10ML) SYRINGE FOR IV PUSH (FOR BLOOD PRESSURE SUPPORT)
PREFILLED_SYRINGE | INTRAVENOUS | Status: AC
  Filled 2014-06-06: qty 10

## 2014-06-06 MED ORDER — PHENYLEPHRINE HCL 10 MG/ML IJ SOLN
INTRAMUSCULAR | Status: DC | PRN
  Administered 2014-06-06 (×3): 40 ug via INTRAVENOUS
  Administered 2014-06-06 (×2): 80 ug via INTRAVENOUS
  Administered 2014-06-06: 40 ug via INTRAVENOUS
  Administered 2014-06-06 (×2): 80 ug via INTRAVENOUS

## 2014-06-06 MED ORDER — TRAZODONE HCL 100 MG PO TABS
200.0000 mg | ORAL_TABLET | Freq: Every day | ORAL | Status: DC
  Administered 2014-06-06: 200 mg via ORAL
  Filled 2014-06-06: qty 2

## 2014-06-06 MED ORDER — DIAZEPAM 5 MG PO TABS
5.0000 mg | ORAL_TABLET | Freq: Four times a day (QID) | ORAL | Status: DC | PRN
  Administered 2014-06-06: 5 mg via ORAL
  Filled 2014-06-06: qty 1

## 2014-06-06 MED ORDER — ACETAMINOPHEN 650 MG RE SUPP: 650.0000 mg | RECTAL | Status: DC | PRN

## 2014-06-06 MED ORDER — LACTATED RINGERS IV SOLN
INTRAVENOUS | Status: DC | PRN
  Administered 2014-06-06 (×2): via INTRAVENOUS

## 2014-06-06 MED ORDER — HYDROMORPHONE HCL 1 MG/ML IJ SOLN
INTRAMUSCULAR | Status: AC
  Filled 2014-06-06: qty 1

## 2014-06-06 MED ORDER — MORPHINE SULFATE 2 MG/ML IJ SOLN: 1.0000 mg | INTRAMUSCULAR | Status: DC | PRN

## 2014-06-06 MED ORDER — DILTIAZEM HCL ER 60 MG PO CP12
60.0000 mg | ORAL_CAPSULE | Freq: Two times a day (BID) | ORAL | Status: DC
  Administered 2014-06-06 – 2014-06-07 (×2): 60 mg via ORAL
  Filled 2014-06-06 (×3): qty 1

## 2014-06-06 MED ORDER — CITALOPRAM HYDROBROMIDE 10 MG PO TABS: 20.0000 mg | ORAL_TABLET | Freq: Every day | ORAL | Status: DC

## 2014-06-06 MED ORDER — DIAZEPAM 5 MG PO TABS: 5.0000 mg | ORAL_TABLET | Freq: Four times a day (QID) | ORAL | Status: DC | PRN

## 2014-06-06 MED ORDER — HYDROCHLOROTHIAZIDE 25 MG PO TABS
25.0000 mg | ORAL_TABLET | Freq: Every day | ORAL | Status: DC
  Administered 2014-06-06 – 2014-06-07 (×2): 25 mg via ORAL
  Filled 2014-06-06 (×2): qty 1

## 2014-06-06 MED ORDER — MIDAZOLAM HCL 2 MG/2ML IJ SOLN
INTRAMUSCULAR | Status: AC
  Filled 2014-06-06: qty 2

## 2014-06-06 MED ORDER — BUPIVACAINE-EPINEPHRINE (PF) 0.5% -1:200000 IJ SOLN
INTRAMUSCULAR | Status: DC | PRN
  Administered 2014-06-06: 10 mL via PERINEURAL

## 2014-06-06 MED ORDER — NEOSTIGMINE METHYLSULFATE 10 MG/10ML IV SOLN
INTRAVENOUS | Status: AC
  Filled 2014-06-06: qty 1

## 2014-06-06 MED ORDER — ONDANSETRON HCL 4 MG/2ML IJ SOLN: 4.0000 mg | INTRAMUSCULAR | Status: DC | PRN

## 2014-06-06 MED ORDER — LACTATED RINGERS IV SOLN
INTRAVENOUS | Status: DC
  Administered 2014-06-06: 12:00:00 via INTRAVENOUS

## 2014-06-06 MED ORDER — GABAPENTIN 300 MG PO CAPS: 300.0000 mg | ORAL_CAPSULE | Freq: Three times a day (TID) | ORAL | Status: DC

## 2014-06-06 MED ORDER — ARTIFICIAL TEARS OP OINT
TOPICAL_OINTMENT | OPHTHALMIC | Status: DC | PRN
  Administered 2014-06-06: 1 via OPHTHALMIC

## 2014-06-06 MED ORDER — IBUPROFEN 200 MG PO TABS: 800.0000 mg | ORAL_TABLET | Freq: Three times a day (TID) | ORAL | Status: DC | PRN

## 2014-06-06 MED ORDER — SODIUM CHLORIDE 0.9 % IR SOLN
Status: DC | PRN
  Administered 2014-06-06: 14:00:00

## 2014-06-06 MED ORDER — DEXAMETHASONE SODIUM PHOSPHATE 4 MG/ML IJ SOLN
INTRAMUSCULAR | Status: DC | PRN
  Administered 2014-06-06: 4 mg via INTRAVENOUS

## 2014-06-06 MED ORDER — BACITRACIN ZINC 500 UNIT/GM EX OINT
TOPICAL_OINTMENT | CUTANEOUS | Status: DC | PRN
  Administered 2014-06-06: 1 via TOPICAL

## 2014-06-06 MED ORDER — DOCUSATE SODIUM 100 MG PO CAPS
100.0000 mg | ORAL_CAPSULE | Freq: Two times a day (BID) | ORAL | Status: DC
  Administered 2014-06-06 – 2014-06-07 (×2): 100 mg via ORAL
  Filled 2014-06-06 (×2): qty 1

## 2014-06-06 MED ORDER — OXYCODONE-ACETAMINOPHEN 5-325 MG PO TABS
1.0000 | ORAL_TABLET | ORAL | Status: DC | PRN
  Administered 2014-06-06 – 2014-06-07 (×3): 2 via ORAL
  Filled 2014-06-06 (×3): qty 2

## 2014-06-06 MED ORDER — CEFAZOLIN SODIUM-DEXTROSE 2-3 GM-% IV SOLR
2.0000 g | Freq: Three times a day (TID) | INTRAVENOUS | Status: AC
  Administered 2014-06-06 – 2014-06-07 (×2): 2 g via INTRAVENOUS
  Filled 2014-06-06 (×2): qty 50

## 2014-06-06 MED ORDER — ACETAMINOPHEN 10 MG/ML IV SOLN
INTRAVENOUS | Status: AC
  Administered 2014-06-06: 1000 mg via INTRAVENOUS
  Filled 2014-06-06: qty 100

## 2014-06-06 MED ORDER — NEOSTIGMINE METHYLSULFATE 10 MG/10ML IV SOLN
INTRAVENOUS | Status: DC | PRN
  Administered 2014-06-06: 3 mg via INTRAVENOUS

## 2014-06-06 MED ORDER — HEMOSTATIC AGENTS (NO CHARGE) OPTIME
TOPICAL | Status: DC | PRN
  Administered 2014-06-06: 1 via TOPICAL

## 2014-06-06 MED ORDER — PHENOL 1.4 % MT LIQD: 1.0000 | OROMUCOSAL | Status: DC | PRN

## 2014-06-06 MED ORDER — HYDROXYZINE HCL 10 MG PO TABS
10.0000 mg | ORAL_TABLET | Freq: Three times a day (TID) | ORAL | Status: DC | PRN
  Filled 2014-06-06: qty 1

## 2014-06-06 MED ORDER — EPHEDRINE SULFATE 50 MG/ML IJ SOLN
INTRAMUSCULAR | Status: DC | PRN
  Administered 2014-06-06: 10 mg via INTRAVENOUS

## 2014-06-06 MED ORDER — FENTANYL CITRATE 0.05 MG/ML IJ SOLN
INTRAMUSCULAR | Status: AC
  Filled 2014-06-06: qty 5

## 2014-06-06 MED ORDER — MENTHOL 3 MG MT LOZG: 1.0000 | LOZENGE | OROMUCOSAL | Status: DC | PRN

## 2014-06-06 MED ORDER — ONDANSETRON HCL 4 MG/2ML IJ SOLN
INTRAMUSCULAR | Status: DC | PRN
  Administered 2014-06-06: 4 mg via INTRAVENOUS

## 2014-06-06 MED ORDER — GLYCOPYRROLATE 0.2 MG/ML IJ SOLN
INTRAMUSCULAR | Status: AC
  Filled 2014-06-06: qty 2

## 2014-06-06 MED ORDER — ISOSORBIDE MONONITRATE ER 30 MG PO TB24
30.0000 mg | ORAL_TABLET | Freq: Every day | ORAL | Status: DC
  Administered 2014-06-07: 30 mg via ORAL
  Filled 2014-06-06: qty 1

## 2014-06-06 MED ORDER — HYDROCODONE-ACETAMINOPHEN 5-325 MG PO TABS
1.0000 | ORAL_TABLET | ORAL | Status: DC | PRN
  Administered 2014-06-07: 2 via ORAL
  Filled 2014-06-06: qty 2

## 2014-06-06 SURGICAL SUPPLY — 55 items
APL SKNCLS STERI-STRIP NONHPOA (GAUZE/BANDAGES/DRESSINGS) ×1
BAG DECANTER FOR FLEXI CONT (MISCELLANEOUS) ×2 IMPLANT
BENZOIN TINCTURE PRP APPL 2/3 (GAUZE/BANDAGES/DRESSINGS) ×2 IMPLANT
BLADE CLIPPER SURG (BLADE) IMPLANT
BRUSH SCRUB EZ PLAIN DRY (MISCELLANEOUS) ×2 IMPLANT
BUR MATCHSTICK NEURO 3.0 LAGG (BURR) ×2 IMPLANT
BUR PRECISION FLUTE 6.0 (BURR) ×2 IMPLANT
CANISTER SUCT 3000ML PPV (MISCELLANEOUS) ×2 IMPLANT
CONT SPEC 4OZ CLIKSEAL STRL BL (MISCELLANEOUS) ×2 IMPLANT
DRAPE LAPAROTOMY 100X72X124 (DRAPES) ×2 IMPLANT
DRAPE MICROSCOPE LEICA (MISCELLANEOUS) ×2 IMPLANT
DRAPE POUCH INSTRU U-SHP 10X18 (DRAPES) ×2 IMPLANT
DRAPE SURG 17X23 STRL (DRAPES) ×8 IMPLANT
ELECT BLADE 4.0 EZ CLEAN MEGAD (MISCELLANEOUS) ×2
ELECT REM PT RETURN 9FT ADLT (ELECTROSURGICAL) ×2
ELECTRODE BLDE 4.0 EZ CLN MEGD (MISCELLANEOUS) ×1 IMPLANT
ELECTRODE REM PT RTRN 9FT ADLT (ELECTROSURGICAL) ×1 IMPLANT
GAUZE SPONGE 4X4 12PLY STRL (GAUZE/BANDAGES/DRESSINGS) ×2 IMPLANT
GAUZE SPONGE 4X4 16PLY XRAY LF (GAUZE/BANDAGES/DRESSINGS) ×1 IMPLANT
GLOVE BIO SURGEON STRL SZ8 (GLOVE) ×1 IMPLANT
GLOVE BIO SURGEON STRL SZ8.5 (GLOVE) ×2 IMPLANT
GLOVE BIOGEL PI IND STRL 8.5 (GLOVE) IMPLANT
GLOVE BIOGEL PI INDICATOR 8.5 (GLOVE) ×1
GLOVE EXAM NITRILE LRG STRL (GLOVE) IMPLANT
GLOVE EXAM NITRILE MD LF STRL (GLOVE) IMPLANT
GLOVE EXAM NITRILE XL STR (GLOVE) IMPLANT
GLOVE EXAM NITRILE XS STR PU (GLOVE) IMPLANT
GLOVE SS BIOGEL STRL SZ 8 (GLOVE) ×1 IMPLANT
GLOVE SUPERSENSE BIOGEL SZ 8 (GLOVE) ×1
GOWN STRL REUS W/ TWL LRG LVL3 (GOWN DISPOSABLE) IMPLANT
GOWN STRL REUS W/ TWL XL LVL3 (GOWN DISPOSABLE) ×1 IMPLANT
GOWN STRL REUS W/TWL 2XL LVL3 (GOWN DISPOSABLE) IMPLANT
GOWN STRL REUS W/TWL LRG LVL3 (GOWN DISPOSABLE) ×2
GOWN STRL REUS W/TWL XL LVL3 (GOWN DISPOSABLE) ×4
KIT BASIN OR (CUSTOM PROCEDURE TRAY) ×2 IMPLANT
KIT ROOM TURNOVER OR (KITS) ×2 IMPLANT
NDL HYPO 21X1.5 SAFETY (NEEDLE) IMPLANT
NEEDLE HYPO 21X1.5 SAFETY (NEEDLE) IMPLANT
NEEDLE HYPO 22GX1.5 SAFETY (NEEDLE) ×2 IMPLANT
NS IRRIG 1000ML POUR BTL (IV SOLUTION) ×2 IMPLANT
PACK LAMINECTOMY NEURO (CUSTOM PROCEDURE TRAY) ×2 IMPLANT
PAD ARMBOARD 7.5X6 YLW CONV (MISCELLANEOUS) ×8 IMPLANT
PATTIES SURGICAL .5 X1 (DISPOSABLE) IMPLANT
RUBBERBAND STERILE (MISCELLANEOUS) ×4 IMPLANT
SPONGE SURGIFOAM ABS GEL SZ50 (HEMOSTASIS) ×2 IMPLANT
STRIP CLOSURE SKIN 1/2X4 (GAUZE/BANDAGES/DRESSINGS) ×2 IMPLANT
SUT VIC AB 1 CT1 18XBRD ANBCTR (SUTURE) ×1 IMPLANT
SUT VIC AB 1 CT1 8-18 (SUTURE) ×2
SUT VIC AB 2-0 CP2 18 (SUTURE) ×2 IMPLANT
SYR 20CC LL (SYRINGE) IMPLANT
SYR 20ML ECCENTRIC (SYRINGE) ×2 IMPLANT
TAPE CLOTH SURG 4X10 WHT LF (GAUZE/BANDAGES/DRESSINGS) ×1 IMPLANT
TOWEL OR 17X24 6PK STRL BLUE (TOWEL DISPOSABLE) ×2 IMPLANT
TOWEL OR 17X26 10 PK STRL BLUE (TOWEL DISPOSABLE) ×2 IMPLANT
WATER STERILE IRR 1000ML POUR (IV SOLUTION) ×2 IMPLANT

## 2014-06-06 NOTE — Anesthesia Procedure Notes (Signed)
Procedure Name: Intubation Date/Time: 06/06/2014 2:02 PM Performed by: Susa Loffler Pre-anesthesia Checklist: Patient identified, Timeout performed, Emergency Drugs available, Patient being monitored and Suction available Patient Re-evaluated:Patient Re-evaluated prior to inductionOxygen Delivery Method: Circle system utilized Preoxygenation: Pre-oxygenation with 100% oxygen Intubation Type: IV induction Ventilation: Mask ventilation without difficulty and Oral airway inserted - appropriate to patient size Laryngoscope Size: Mac and 3 Grade View: Grade II Tube type: Oral Tube size: 7.0 mm Number of attempts: 1 Airway Equipment and Method: Stylet and Oral airway Placement Confirmation: ETT inserted through vocal cords under direct vision,  positive ETCO2 and breath sounds checked- equal and bilateral Secured at: 21 cm Tube secured with: Tape Dental Injury: Teeth and Oropharynx as per pre-operative assessment

## 2014-06-06 NOTE — Anesthesia Preprocedure Evaluation (Addendum)
Anesthesia Evaluation  Patient identified by MRN, date of birth, ID band Patient awake    Reviewed: Allergy & Precautions, H&P , NPO status , Patient's Chart, lab work & pertinent test results, reviewed documented beta blocker date and time   Airway Mallampati: III  TM Distance: >3 FB Neck ROM: Full    Dental no notable dental hx. (+) Teeth Intact, Dental Advisory Given   Pulmonary shortness of breath and with exertion,  breath sounds clear to auscultation  Pulmonary exam normal       Cardiovascular hypertension, Pt. on medications and Pt. on home beta blockers + dysrhythmias Rhythm:Regular Rate:Normal     Neuro/Psych  Headaches, Anxiety Depression    GI/Hepatic Neg liver ROS, GERD-  Medicated and Controlled,  Endo/Other  diabetes, Type 2, Oral Hypoglycemic AgentsMorbid obesity  Renal/GU negative Renal ROS  negative genitourinary   Musculoskeletal  (+) Arthritis -, Osteoarthritis,    Abdominal   Peds  Hematology negative hematology ROS (+)   Anesthesia Other Findings   Reproductive/Obstetrics negative OB ROS                            Anesthesia Physical Anesthesia Plan  ASA: III  Anesthesia Plan: General   Post-op Pain Management:    Induction: Intravenous  Airway Management Planned: Oral ETT  Additional Equipment:   Intra-op Plan:   Post-operative Plan: Extubation in OR  Informed Consent: I have reviewed the patients History and Physical, chart, labs and discussed the procedure including the risks, benefits and alternatives for the proposed anesthesia with the patient or authorized representative who has indicated his/her understanding and acceptance.   Dental advisory given  Plan Discussed with: CRNA  Anesthesia Plan Comments:         Anesthesia Quick Evaluation

## 2014-06-06 NOTE — Progress Notes (Signed)
Subjective:  The patient is alert and pleasant. She is in no apparent distress. She looks well.  Objective: Vital signs in last 24 hours: Temp:  [97.6 F (36.4 C)-98.9 F (37.2 C)] 98.9 F (37.2 C) (02/18 1547) Pulse Rate:  [72-76] 72 (02/18 1547) Resp:  [14-20] 14 (02/18 1547) BP: (115-128)/(71-75) 128/71 mmHg (02/18 1547) SpO2:  [91 %-97 %] 91 % (02/18 1547) Weight:  [112.492 kg (248 lb)] 112.492 kg (248 lb) (02/18 1127)  Intake/Output from previous day:   Intake/Output this shift: Total I/O In: 1200 [I.V.:1200] Out: 50 [Blood:50]  Physical exam the patient is alert and pleasant. She is moving her lower extremities well.  Lab Results:  Recent Labs  06/04/14 1428  WBC 5.3  HGB 12.4  HCT 38.1  PLT 226   BMET  Recent Labs  06/04/14 1428  NA 140  K 3.8  CL 106  CO2 25  GLUCOSE 98  BUN 10  CREATININE 0.99  CALCIUM 9.4    Studies/Results: No results found.  Assessment/Plan: The patient is doing well. I spoke with her family.      Adriannah Steinkamp D 06/06/2014, 4:27 PM

## 2014-06-06 NOTE — Progress Notes (Signed)
Pt states continued tingling left leg and foot with decreased sensation mostly from knee down. States this was present preop . No worsening of symptoms post op.

## 2014-06-06 NOTE — Op Note (Signed)
Brief history: The patient is a 54 year old black female who has complained of back and left leg pain. She has failed medical management and was worked up with a lumbar MRI. This demonstrated the patient had multilevel degenerative changes as well as a herniated disc at L5-S1 on the left compressing left S1 nerve root. I discussed the various treatment options with patient including surgery. She has weighed the risks, benefits, and alternative surgery and decided proceed with a left L5-S1 discectomy.  Preoperative diagnosis: Left L5-S1 herniated disc, lumbago, lumbar radiculopathy, lumbar degenerative disc disease  Postoperative diagnosis: As above  Procedure: Left L5-S1 Intervertebral discectomy using micro-dissection  Surgeon: Dr. Earle Gell  Asst.: Dr. Erline Levine  Anesthesia: Gen. endotracheal  Estimated blood loss: Minimal  Drains: None  Complications: None  Description of procedure: The patient was brought to the operating room by the anesthesia team. General endotracheal anesthesia was induced. The patient was turned to the prone position on the Wilson frame. The patient's lumbosacral region was then prepared with Betadine scrub and Betadine solution. Sterile drapes were applied.  I then injected the area to be incised with Marcaine with epinephrine solution. I then used a scalpel to make a linear midline incision over the L5-S1 intervertebral disc space. I then used electrocautery to perform a left sided subperiosteal dissection exposing the spinous process and lamina of L5 and the upper sacrum. We obtained intraoperative radiograph to confirm our location. I then inserted the [facility] retractor for exposure.  We then brought the operative microscope into the field. Under its magnification and illumination we completed the microdissection. I used a high-speed drill to perform a laminotomy at L5 on the left. I then used a Kerrison punches to widen the laminotomy and removed the  ligamentum flavum at L5-S1 on the left. We then used microdissection to free up the thecal sac and the left S1 nerve root from the epidural tissue. I then used a Kerrison punch to perform a foraminotomy at about the S1 nerve root. We then using the nerve root retractor to gently retract the thecal sac and the S1 nerve root medially. This exposed the intervertebral disc. We identified the ruptured disc and remove it with the pituitary forceps. I inspected the intervertebral disc at L5-S1 on the left. There was no large holes in the annulus nor impending herniations. I did not perform intervertebral discectomy.  I then palpated along the ventral surface of the thecal sac and along exit route of the left S1 nerve root and noted that the neural structures were well decompressed. This completed the decompression.  We then obtained hemostasis using bipolar electrocautery. We irrigated the wound out with bacitracin solution. We then removed the retractor. We then reapproximated the patient's thoracolumbar fascia with interrupted #1 Vicryl suture. We then reapproximated the patient's subcutaneous tissue with interrupted 2-0 Vicryl suture. We then reapproximated patient's skin with Steri-Strips and benzoin. The was then coated with bacitracin ointment. The drapes were removed. The patient was subsequently returned to the supine position where they were extubated by the anesthesia team. The patient was then transported to the postanesthesia care unit in stable condition. All sponge instrument and needle counts were reportedly correct at the end of this case.

## 2014-06-06 NOTE — Anesthesia Postprocedure Evaluation (Signed)
  Anesthesia Post-op Note  Patient: Norma [name]  Procedure(s) Performed: Procedure(s) with comments: LUMBAR LAMINECTOMY/DECOMPRESSION MICRODISCECTOMY 1 LEVEL (Left) - Left L5S1 microdiskectomy  Patient Location: PACU  Anesthesia Type:General  Level of Consciousness: awake and alert   Airway and Oxygen Therapy: Patient Spontanous Breathing  Post-op Pain: mild  Post-op Assessment: Post-op Vital signs reviewed, Patient's Cardiovascular Status Stable and Respiratory Function Stable  Post-op Vital Signs: Reviewed  Filed Vitals:   06/06/14 1547  BP: 128/71  Pulse: 72  Temp: 37.2 C  Resp: 14    Complications: No apparent anesthesia complications

## 2014-06-06 NOTE — H&P (Signed)
Subjective: The patient is a 54 year old black female who has complained of back and left leg pain consistent with a lumbar radiculopathy. She has failed medical management and was worked up with a lumbar MRI. This demonstrated a herniated disc at L5-S1 on the left. I discussed the various treatment options including surgery. She has weighed the risks, benefits, and alternative surgery and decided proceed with a left L5-S1 discectomy.   Past Medical History  Diagnosis Date  . Hypertension   . Acid reflux   . Tachyarrhythmia   . Left shoulder pain   . Back pain   . Trigger point of thoracic region 03/22/2012  . Irregular heart beat   . Shortness of breath dyspnea     with exertion  . Diabetes mellitus without complication     on meds  . Depression   . Anxiety   . Headache     stress headaches, migraines at times  . Arthritis     Past Surgical History  Procedure Laterality Date  . Ankle surgery    . Rotator cuff repair Left 02/20/14  . Tubal ligation      Allergies  Allergen Reactions  . Soma [Carisoprodol]     Itching/rash around mouth, itching back of throat.   . Nortriptyline Rash  . Prednisone Rash    REACTION: rash and itching.     History  Substance Use Topics  . Smoking status: Never Smoker   . Smokeless tobacco: Never Used  . Alcohol Use: Yes     Comment: occasionally    Family History  Problem Relation Age of Onset  . Diabetes Brother   . Diabetes Maternal Aunt   . Diabetes Paternal Aunt   . Diabetes Maternal Grandmother   . Diabetes Maternal Grandfather   . Diabetes Paternal Grandmother   . Diabetes Paternal Grandfather    Prior to Admission medications   Medication Sig Start Date End Date Taking? Authorizing Provider  ARIPiprazole (ABILIFY) 2 MG tablet Take 2 mg by mouth daily.   Yes Historical Provider, MD  beta carotene w/minerals (OCUVITE) tablet Take 1 tablet by mouth daily.   Yes Historical Provider, MD  buPROPion (WELLBUTRIN XL) 300 MG 24 hr  tablet Take 300 mg by mouth daily.   Yes Historical Provider, MD  citalopram (CELEXA) 20 MG tablet Take 20 mg by mouth daily.   Yes Historical Provider, MD  cyclobenzaprine (FLEXERIL) 10 MG tablet Take 1 tablet (10 mg total) by mouth 3 (three) times daily as needed for muscle spasms. 05/03/14  Yes [name], MD  diclofenac sodium (VOLTAREN) 1 % GEL Apply 2 g topically 4 (four) times daily. 03/29/14  Yes [name], MD  diltiazem (CARDIZEM SR) 60 MG 12 hr capsule Take 1 capsule (60 mg total) by mouth 2 (two) times daily. 05/08/13  Yes Josalyn C Funches, MD  fish oil-omega-3 fatty acids 1000 MG capsule Take 2 g by mouth daily.   Yes Historical Provider, MD  gabapentin (NEURONTIN) 300 MG capsule Take 300 mg by mouth 3 (three) times daily.   Yes Historical Provider, MD  hydrochlorothiazide (HYDRODIURIL) 25 MG tablet Take 1 tablet (25 mg total) by mouth daily. 05/13/14  Yes [name], MD  hydrOXYzine (ATARAX/VISTARIL) 10 MG tablet Take 10 mg by mouth 3 (three) times daily as needed for itching.   Yes Historical Provider, MD  isosorbide mononitrate (IMDUR) 30 MG 24 hr tablet Take 30 mg by mouth daily.   Yes Historical Provider, MD  ketorolac (  TORADOL) 30 MG/ML injection Inject 1 mL (30 mg total) into the muscle once. 04/25/14  Yes Patrecia Pour, MD  metFORMIN (GLUCOPHAGE) 500 MG tablet Take 1 tablet (500 mg total) by mouth 2 (two) times daily with a meal. 03/19/14  Yes [name], MD  metoprolol tartrate (LOPRESSOR) 25 MG tablet Take 1 tablet (25 mg total) by mouth 2 (two) times daily. 05/13/14  Yes [name], MD  milk thistle 175 MG tablet Take 350 mg by mouth daily.   Yes Historical Provider, MD  Multiple Vitamin (MULTIVITAMIN WITH MINERALS) TABS tablet Take 1 tablet by mouth daily.   Yes Historical Provider, MD  naproxen (NAPROSYN) 500 MG tablet Take 1 tablet (500 mg total) by mouth 2 (two) times daily with a meal. 04/25/14  Yes Patrecia Pour, MD  omeprazole (PRILOSEC) 20 MG capsule take 1  capsule by mouth once daily   Yes [name], MD  oxyCODONE-acetaminophen (PERCOCET) 5-325 MG per tablet Take 1-2 tablets by mouth every 6 (six) hours as needed. Patient taking differently: Take 1-2 tablets by mouth every 6 (six) hours as needed for moderate pain.  05/07/14  Yes Carrie Mew, PA-C  pregabalin (LYRICA) 75 MG capsule Take 75 mg by mouth 2 (two) times daily.   Yes Historical Provider, MD  traZODone (DESYREL) 100 MG tablet Take 2 tablets (200 mg total) by mouth at bedtime. 03/19/14  Yes [name], MD  Blood Glucose Monitoring Suppl (BLOOD GLUCOSE METER KIT AND SUPPLIES) Dispense based on patient and insurance preference. Use up to four times daily as directed. (FOR ICD-9 250.00, 250.01). 01/14/14   [name], MD  diazepam (VALIUM) 5 MG tablet Take 5 mg by mouth every 6 (six) hours as needed for anxiety.    Historical Provider, MD  EPINEPHrine (EPIPEN) 0.3 mg/0.3 mL SOAJ Inject 0.3 mLs (0.3 mg total) into the muscle once. 11/02/12   [name], MD  ibuprofen (ADVIL,MOTRIN) 800 MG tablet Take 1 tablet (800 mg total) by mouth every 8 (eight) hours as needed. Patient not taking: Reported on 05/31/2014 05/15/14   [name], MD  SUMAtriptan (IMITREX) 50 MG tablet Take 1 tablet (50 mg total) by mouth every 2 (two) hours as needed for migraine or headache. Do not exceed 200 mg daily. 06/20/13   Coral Spikes, DO     Review of Systems  Positive ROS: As above  All other systems have been reviewed and were otherwise negative with the exception of those mentioned in the HPI and as above.  Objective: Vital signs in last 24 hours: Temp:  [97.6 F (36.4 C)] 97.6 F (36.4 C) (02/18 1127) Pulse Rate:  [76] 76 (02/18 1127) Resp:  [20] 20 (02/18 1127) BP: (115)/(75) 115/75 mmHg (02/18 1127) SpO2:  [97 %] 97 % (02/18 1127) Weight:  [112.492 kg (248 lb)] 112.492 kg (248 lb) (02/18 1127)  General Appearance: Alert, cooperative, no distress, Head: Normocephalic,  without obvious abnormality, atraumatic Eyes: PERRL, conjunctiva/corneas clear, EOM's intact,    Ears: Normal  Throat: Normal  Neck: Supple, symmetrical, trachea midline, no adenopathy; thyroid: No enlargement/tenderness/nodules; no carotid bruit or JVD Back: Symmetric, no curvature, ROM normal, no CVA tenderness Lungs: Clear to auscultation bilaterally, respirations unlabored Heart: Regular rate and rhythm, no murmur, rub or gallop Abdomen: Soft, non-tender,, no masses, no organomegaly Extremities: Extremities normal, atraumatic, no cyanosis or edema Pulses: 2+ and symmetric all extremities Skin: Skin color, texture, turgor normal, no rashes or lesions  NEUROLOGIC:   Mental status: alert and oriented, no aphasia, good attention span, Fund of knowledge/ memory ok Motor Exam - grossly normal Sensory Exam - grossly normal Reflexes:  Coordination - grossly normal Gait - grossly normal Balance - grossly normal Cranial Nerves: I: smell Not tested  II: visual acuity  OS: Normal  OD: Normal   II: visual fields Full to confrontation  II: pupils Equal, round, reactive to light  III,VII: ptosis None  III,IV,VI: extraocular muscles  Full ROM  V: mastication Normal  V: facial light touch sensation  Normal  V,VII: corneal reflex  Present  VII: facial muscle function - upper  Normal  VII: facial muscle function - lower Normal  VIII: hearing Not tested  IX: soft palate elevation  Normal  IX,X: gag reflex Present  XI: trapezius strength  5/5  XI: sternocleidomastoid strength 5/5  XI: neck flexion strength  5/5  XII: tongue strength  Normal    Data Review Lab Results  Component Value Date   WBC 5.3 06/04/2014   HGB 12.4 06/04/2014   HCT 38.1 06/04/2014   MCV 84.3 06/04/2014   PLT 226 06/04/2014   Lab Results  Component Value Date   NA 140 06/04/2014   K 3.8 06/04/2014   CL 106 06/04/2014   CO2 25 06/04/2014   BUN 10 06/04/2014   CREATININE 0.99 06/04/2014   GLUCOSE 98  06/04/2014   No results found for: INR, PROTIME  Assessment/Plan: Left L5-S1 herniated disc, lumbago, lumbar radiculopathy: I have discussed the situation with the patient. I have reviewed her MRI scan with her and pointed out the abnormalities. We have discussed the various treatment options including a left L5-S1 discectomy. I have shown her surgical models. We have discussed the risks, benefits, and alternative surgery as well as likelihood of achieving our goals with surgery. I have answered all her questions. She has decided to proceed with surgery.   Avagail Whittlesey D 06/06/2014 1:52 PM

## 2014-06-06 NOTE — Transfer of Care (Signed)
Immediate Anesthesia Transfer of Care Note  Patient: Norma [name]  Procedure(s) Performed: Procedure(s) with comments: LUMBAR LAMINECTOMY/DECOMPRESSION MICRODISCECTOMY 1 LEVEL (Left) - Left L5S1 microdiskectomy  Patient Location: PACU  Anesthesia Type:General  Level of Consciousness: awake, alert  and oriented  Airway & Oxygen Therapy: Patient Spontanous Breathing and Patient connected to nasal cannula oxygen  Post-op Assessment: Report given to RN, Post -op Vital signs reviewed and stable and Patient moving all extremities  Post vital signs: Reviewed and stable  Last Vitals:  Filed Vitals:   06/06/14 1547  BP:   Pulse:   Temp: 37.2 C  Resp:     Complications: No apparent anesthesia complications

## 2014-06-06 NOTE — Plan of Care (Signed)
Problem: Consults Goal: Diagnosis - Spinal Surgery Lumbar Laminectomy (Complex)     

## 2014-06-07 DIAGNOSIS — M5127 Other intervertebral disc displacement, lumbosacral region: Secondary | ICD-10-CM | POA: Diagnosis not present

## 2014-06-07 MED ORDER — OXYCODONE-ACETAMINOPHEN 10-325 MG PO TABS: 1.0000 | ORAL_TABLET | ORAL | Status: DC | PRN

## 2014-06-07 MED ORDER — DOCUSATE SODIUM 100 MG PO CAPS: 100.0000 mg | ORAL_CAPSULE | Freq: Two times a day (BID) | ORAL | Status: DC

## 2014-06-07 MED ORDER — DIAZEPAM 5 MG PO TABS: 5.0000 mg | ORAL_TABLET | Freq: Four times a day (QID) | ORAL | Status: DC | PRN

## 2014-06-07 NOTE — Evaluation (Signed)
Physical Therapy Evaluation Patient Details Name: Norma [name] MRN: [id number] DOB: 02-14-1961 Today's Date: 06/07/2014   History of Present Illness  s/p L5-S1 discectomy  Clinical Impression  Patient evaluated by Physical Therapy with no further acute PT needs identified. All education has been completed and the patient has no further questions. Pt asking excellent questions and all questions answered. Handout provided. PT is signing off. Thank you for this referral.     Follow Up Recommendations No PT follow up;Supervision - Intermittent    Equipment Recommendations  None recommended by PT    Recommendations for Other Services       Precautions / Restrictions Precautions Precautions: Back Precaution Booklet Issued: Yes (comment) Precaution Comments: extensively reviewed and demonstrated back precautions      Mobility  Bed Mobility Overal bed mobility: Needs Assistance Bed Mobility: Rolling;Sidelying to Sit;Sit to Sidelying Rolling: Supervision Sidelying to sit: Supervision     Sit to sidelying: Supervision General bed mobility comments: vc for sequencing to maintain back precautions  Transfers Overall transfer level: Modified independent Equipment used: None             General transfer comment: cued for keeping head up to assist with maintaining back in neutral and using hip flexion to come to stand  Ambulation/Gait Ambulation/Gait assistance: Supervision;Independent Ambulation Distance (Feet): 50 Feet Assistive device: None Gait Pattern/deviations: WFL(Within Functional Limits) (slightly antalgic by end of session; discussed use of RW)        Stairs Stairs: Yes Stairs assistance: Modified independent (Device/Increase time) Stair Management: One rail Left;Step to pattern;Sideways Number of Stairs: 3 General stair comments: pt very appreciative of learning sideways technique  Wheelchair Mobility    Modified Rankin (Stroke Patients Only)        Balance                                             Pertinent Vitals/Pain Pain Assessment: 0-10 Pain Score: 7  Pain Location: back Pain Intervention(s): Limited activity within patient's tolerance;Monitored during session;Repositioned    Home Living Family/patient expects to be discharged to:: Private residence Living Arrangements: Children Available Help at Discharge: Family;Available PRN/intermittently Type of Home: House Home Access: Stairs to enter Entrance Stairs-Rails: Left Entrance Stairs-Number of Steps: 2 Home Layout: One level Home Equipment: Walker - standard;Cane - single point;Crutches;Walker - 2 wheels      Prior Function Level of Independence: Needs assistance   Gait / Transfers Assistance Needed: limited due to pain; using cane vs RW at times  ADL's / Homemaking Assistance Needed: assist with B/D and housework        Hand Dominance        Extremity/Trunk Assessment   Upper Extremity Assessment: Overall WFL for tasks assessed           Lower Extremity Assessment: Overall WFL for tasks assessed      Cervical / Trunk Assessment: Normal  Communication      Cognition Arousal/Alertness: Awake/alert                          General Comments      Exercises        Assessment/Plan    PT Assessment Patent does not need any further PT services  PT Diagnosis Acute pain;Difficulty walking   PT Problem List    PT Treatment Interventions  PT Goals (Current goals can be found in the Care Plan section) Acute Rehab PT Goals Patient Stated Goal: return home with less pain PT Goal Formulation: All assessment and education complete, DC therapy    Frequency     Barriers to discharge        Co-evaluation               End of Session   Activity Tolerance: Patient tolerated treatment well Patient left: in bed;with call bell/phone within reach;with family/visitor present Nurse Communication:  (OK to d/c  from PT perspective)    Functional Assessment Tool Used: clinical judgement Functional Limitation: Mobility: Walking and moving around Mobility: Walking and Moving Around Current Status (T0569): At least 1 percent but less than 20 percent impaired, limited or restricted Mobility: Walking and Moving Around Goal Status [phone]): At least 1 percent but less than 20 percent impaired, limited or restricted Mobility: Walking and Moving Around Discharge Status [phone]): At least 1 percent but less than 20 percent impaired, limited or restricted    Time: 0823-0847 PT Time Calculation (min) (ACUTE ONLY): 24 min   Charges:   PT Evaluation $Initial PT Evaluation Tier I: 1 Procedure PT Treatments $Therapeutic Activity: 8-22 mins   PT G Codes:   PT G-Codes **NOT FOR INPATIENT CLASS** Functional Assessment Tool Used: clinical judgement Functional Limitation: Mobility: Walking and moving around Mobility: Walking and Moving Around Current Status (Z4827): At least 1 percent but less than 20 percent impaired, limited or restricted Mobility: Walking and Moving Around Goal Status [phone]): At least 1 percent but less than 20 percent impaired, limited or restricted Mobility: Walking and Moving Around Discharge Status [phone]): At least 1 percent but less than 20 percent impaired, limited or restricted    Kamyia Thomason 06/07/2014, 8:55 AM Pager [phone]

## 2014-06-07 NOTE — Progress Notes (Signed)
Discharge instructions reviewed with patient. RXs given. All questions answered at this time. Site appears clean/dry intact with steri strips in place. Patient wishes to eat lunch prior to discharge. Awaiting for transport from family.   Ave Filter, RN

## 2014-06-07 NOTE — Discharge Summary (Signed)
Physician Discharge Summary  Patient ID: Norma [name] MRN: [id number] DOB/AGE: [birth year] 56 y.o.  Admit date: 06/06/2014 Discharge date: 06/07/2014  Admission Diagnoses: Left L5-S1 herniated disc, lumbago, lumbar radiculopathy  Discharge Diagnoses: The same Active Problems:   Lumbar herniated disc   Discharged Condition: good  Hospital Course: I performed a left L5-S1 discectomy on the patient on 06/06/2014. The surgery went well.  The patient's postoperative course was unremarkable. On postoperative day #1 the patient requested discharge to home. She was given oral and written discharge instructions. All questions were answered.  Consults: None Significant Diagnostic Studies: None Treatments: Left L5-S1 discectomy using microdissection Discharge Exam: Blood pressure 117/59, pulse 78, temperature 98.1 F (36.7 C), temperature source Oral, resp. rate 16, height $RemoveBe'5\' 8"'spUJyRGIi$  (1.727 m), weight 112.492 kg (248 lb), SpO2 98 %. The patient is alert and pleasant. She looks well. Her strength is normal and lower extremities.  Disposition: Home  Discharge Instructions    Call MD for:  difficulty breathing, headache or visual disturbances    Complete by:  As directed      Call MD for:  extreme fatigue    Complete by:  As directed      Call MD for:  hives    Complete by:  As directed      Call MD for:  persistant dizziness or light-headedness    Complete by:  As directed      Call MD for:  persistant nausea and vomiting    Complete by:  As directed      Call MD for:  redness, tenderness, or signs of infection (pain, swelling, redness, odor or green/yellow discharge around incision site)    Complete by:  As directed      Call MD for:  severe uncontrolled pain    Complete by:  As directed      Call MD for:  temperature >100.4    Complete by:  As directed      Diet - low sodium heart healthy    Complete by:  As directed      Discharge instructions    Complete by:  As directed   Call  [phone] for a followup appointment. Take a stool softener while you are using pain medications.     Driving Restrictions    Complete by:  As directed   Do not drive for 2 weeks.     Increase activity slowly    Complete by:  As directed      Lifting restrictions    Complete by:  As directed   Do not lift more than 5 pounds. No excessive bending or twisting.     May shower / Bathe    Complete by:  As directed   He may shower after the pain she is removed 3 days after surgery. Leave the incision alone.     Remove dressing in 48 hours    Complete by:  As directed   Your stitches are under the scan and will dissolve by themselves. The Steri-Strips will fall off after you take a few showers. Do not rub back or pick at the wound, Leave the wound alone.            Medication List    STOP taking these medications        cyclobenzaprine 10 MG tablet  Commonly known as:  FLEXERIL     ketorolac 30 MG/ML injection  Commonly known as:  TORADOL     naproxen 500 MG  tablet  Commonly known as:  NAPROSYN     oxyCODONE-acetaminophen 5-325 MG per tablet  Commonly known as:  PERCOCET  Replaced by:  oxyCODONE-acetaminophen 10-325 MG per tablet      TAKE these medications        ARIPiprazole 2 MG tablet  Commonly known as:  ABILIFY  Take 2 mg by mouth daily.     beta carotene w/minerals tablet  Take 1 tablet by mouth daily.     blood glucose meter kit and supplies  Dispense based on patient and insurance preference. Use up to four times daily as directed. (FOR ICD-9 250.00, 250.01).     buPROPion 300 MG 24 hr tablet  Commonly known as:  WELLBUTRIN XL  Take 300 mg by mouth daily.     diazepam 5 MG tablet  Commonly known as:  VALIUM  Take 5 mg by mouth every 6 (six) hours as needed for anxiety.     diazepam 5 MG tablet  Commonly known as:  VALIUM  Take 1 tablet (5 mg total) by mouth every 6 (six) hours as needed for muscle spasms.     diclofenac sodium 1 % Gel  Commonly  known as:  VOLTAREN  Apply 2 g topically 4 (four) times daily.     diltiazem 60 MG 12 hr capsule  Commonly known as:  CARDIZEM SR  Take 1 capsule (60 mg total) by mouth 2 (two) times daily.     docusate sodium 100 MG capsule  Commonly known as:  COLACE  Take 1 capsule (100 mg total) by mouth 2 (two) times daily.     EPINEPHrine 0.3 mg/0.3 mL Soaj injection  Commonly known as:  EPIPEN  Inject 0.3 mLs (0.3 mg total) into the muscle once.     fish oil-omega-3 fatty acids 1000 MG capsule  Take 2 g by mouth daily.     hydrochlorothiazide 25 MG tablet  Commonly known as:  HYDRODIURIL  Take 1 tablet (25 mg total) by mouth daily.     hydrOXYzine 10 MG tablet  Commonly known as:  ATARAX/VISTARIL  Take 10 mg by mouth 3 (three) times daily as needed for itching.     ibuprofen 800 MG tablet  Commonly known as:  ADVIL,MOTRIN  Take 1 tablet (800 mg total) by mouth every 8 (eight) hours as needed.     isosorbide mononitrate 30 MG 24 hr tablet  Commonly known as:  IMDUR  Take 30 mg by mouth daily.     metFORMIN 500 MG tablet  Commonly known as:  GLUCOPHAGE  Take 1 tablet (500 mg total) by mouth 2 (two) times daily with a meal.     metoprolol tartrate 25 MG tablet  Commonly known as:  LOPRESSOR  Take 1 tablet (25 mg total) by mouth 2 (two) times daily.     milk thistle 175 MG tablet  Take 350 mg by mouth daily.     multivitamin with minerals Tabs tablet  Take 1 tablet by mouth daily.     omeprazole 20 MG capsule  Commonly known as:  PRILOSEC  take 1 capsule by mouth once daily     oxyCODONE-acetaminophen 10-325 MG per tablet  Commonly known as:  PERCOCET  Take 1 tablet by mouth every 4 (four) hours as needed for pain.     pregabalin 75 MG capsule  Commonly known as:  LYRICA  Take 75 mg by mouth 2 (two) times daily.     SUMAtriptan 50 MG tablet  Commonly  known as:  IMITREX  Take 1 tablet (50 mg total) by mouth every 2 (two) hours as needed for migraine or headache. Do not  exceed 200 mg daily.     traZODone 100 MG tablet  Commonly known as:  DESYREL  Take 2 tablets (200 mg total) by mouth at bedtime.         SignedOphelia Charter 06/07/2014, 7:21 AM

## 2014-06-08 ENCOUNTER — Encounter (HOSPITAL_COMMUNITY): Payer: Self-pay | Admitting: Neurosurgery

## 2014-06-12 ENCOUNTER — Ambulatory Visit (INDEPENDENT_AMBULATORY_CARE_PROVIDER_SITE_OTHER): Payer: Medicaid Other | Admitting: Family Medicine

## 2014-06-12 VITALS — BP 126/86 | HR 101 | Temp 98.0°F | Wt 247.5 lb

## 2014-06-12 DIAGNOSIS — M545 Low back pain, unspecified: Secondary | ICD-10-CM

## 2014-06-12 DIAGNOSIS — M79605 Pain in left leg: Secondary | ICD-10-CM

## 2014-06-12 MED ORDER — TRIAMCINOLONE ACETONIDE 0.5 % EX OINT: 1.0000 "application " | TOPICAL_OINTMENT | Freq: Two times a day (BID) | CUTANEOUS | Status: DC

## 2014-06-12 MED ORDER — TRAMADOL HCL 50 MG PO TABS: 50.0000 mg | ORAL_TABLET | Freq: Four times a day (QID) | ORAL | Status: DC | PRN

## 2014-06-12 NOTE — Patient Instructions (Signed)
It was nice to see you today.  Please be sure to follow-up again the neurosurgeon to ensure you do not have a postoperative complication.  You can use the tramadol approximately every 8 hours if needed and use her Percocet for breakthrough pain.  I sent a new prescription for topical steroid for your rash.  Follow-up closely with her primary provider.  Take care  Dr. Lacinda Axon

## 2014-06-12 NOTE — Progress Notes (Signed)
   Subjective:    Patient ID: Norma [name], female    DOB: 05-21-1960, 54 y.o.   MRN: [id number]  HPI 54 year old female with long-standing chronic pain presents for a same day appointment with complaints of left thigh pain.  1) Left thigh pain  Patient recently underwent L5-S1 laminectomy/microdiscectomy on 2/18.   She reports that since Saturday she's been experiencing severe left thigh pain.   She reports the pain is lateral and slightly posterior.  Pain is sharp.  Pain is improved with rest and worse with exertion.  She's been taking Percocet and ibuprofen for the past 1-2 days with some relief.  No reports of numbness obtaining. No saddle anesthesia. No incontinence.   Review of Systems Per HPI    Objective:   Physical Exam Filed Vitals:   06/12/14 1454  BP: 126/86  Pulse: 101  Temp: 98 F (36.7 C)   Exam: General: well appearing, NAD. Cardiovascular: RRR. No murmurs, rubs, or gallops. Respiratory: CTAB. No rales, rhonchi, or wheeze. Back: Well healing scar noted. Neuro:  Normal sensation in the lower extremities. 1+ patellar and achilles reflexes.     Assessment & Plan:  See Problem List

## 2014-06-12 NOTE — Assessment & Plan Note (Signed)
Status post laminectomy/microdiscectomy on 2/18. Rx given for tramadol.  Advised Percocet for breakthrough pain. I encouraged patient to follow-up closely with her neurosurgeon (this week or early next week). No red flags on exam.

## 2014-06-13 ENCOUNTER — Encounter (HOSPITAL_COMMUNITY): Payer: Self-pay | Admitting: Neurosurgery

## 2014-06-20 ENCOUNTER — Ambulatory Visit: Payer: Medicaid Other | Admitting: Family Medicine

## 2014-06-21 ENCOUNTER — Telehealth: Payer: Self-pay | Admitting: Family Medicine

## 2014-06-21 ENCOUNTER — Encounter: Payer: Self-pay | Admitting: Family Medicine

## 2014-06-21 ENCOUNTER — Ambulatory Visit (INDEPENDENT_AMBULATORY_CARE_PROVIDER_SITE_OTHER): Payer: Medicaid Other | Admitting: Family Medicine

## 2014-06-21 VITALS — BP 110/70 | HR 95 | Temp 97.8°F | Ht 68.0 in | Wt 245.9 lb

## 2014-06-21 DIAGNOSIS — M85671 Other cyst of bone, right ankle and foot: Secondary | ICD-10-CM

## 2014-06-21 DIAGNOSIS — M2011 Hallux valgus (acquired), right foot: Secondary | ICD-10-CM

## 2014-06-21 NOTE — Addendum Note (Signed)
Addended by: Nolon Rod on: 06/21/2014 04:30 PM   Modules accepted: Orders

## 2014-06-21 NOTE — Assessment & Plan Note (Signed)
Most likely ganglion cyst.  However, will obtain A/P, lateral, mortoise view to r/o hardware origin - Referral to Ortho due to previous ORIF w/ hardware.  May need aspiration under [place] vs possible OR removal - Would like to go to [facility] as had bad experience at [place].

## 2014-06-21 NOTE — Telephone Encounter (Signed)
Pt scheduled to see you today for SDA at 4pm for this issue. Laray Rivkin, CMA.

## 2014-06-21 NOTE — Telephone Encounter (Signed)
[place] Team, please call to notify Ms Cavey that I am sameday provider today so she will be seeing me for her ankle issue.  I am happy to see her but I just wanted our office to notify her of this since she had switched from me as her PCP in the past, to give her the option if she preferred not to see me. I do not know if anyone has room in their schedules, however.  Will copy to Dr Gwenlyn Saran and Tomasa Hosteller who assisted patient in switching PCPs.  Thank you.  [name], MD

## 2014-06-21 NOTE — Telephone Encounter (Signed)
LMTCB will try again later. If pt calls back please let her know that Dr. Dianah Field is happy to see pt today for SDA for ankle issue if she would like to come in today since she switched PCP (if appts are still available). Please read below. Nyeemah Jennette, CMA.

## 2014-06-21 NOTE — Patient Instructions (Signed)
Your referral has been placed and please get your x-rays.  Thanks, Dr. Awanda Mink

## 2014-06-21 NOTE — Telephone Encounter (Signed)
Looks like there was a misunderstanding here. I am the provider she fired last year, and today I noticed she is on sameday schedule. I just wanted to be sure she knew she would be seeing me today, to give her the chance to reschedule if she did not want that. Spoke with Latoya CMA and sounds like she has not called [place] back yet.  [name], MD

## 2014-06-21 NOTE — Progress Notes (Signed)
Norma [name] is a 54 y.o. female who presents today for R ankle cyst.  R ankle cyst - Pt noticed a R ankle cyst about 4-5 months ago after sleeping on this area due to ongoing lumbar herniated disc.  Had recent lumbar microdiscectomy at L5/S1 done about 2 weeks ago.  Cyst is extremely mobile, fluid filled, developed over lateral malleoli.  Non painful unless a lot of pressure on the area.  Has not tried anything for this but does have remote hx of lateral malleoli fx requiring ORIF by [place] several years ago.  No complications from this and denies paresthesias, or weakness into her foot.   Past Medical History  Diagnosis Date  . Hypertension   . Acid reflux   . Tachyarrhythmia   . Left shoulder pain   . Back pain   . Trigger point of thoracic region 03/22/2012  . Irregular heart beat   . Shortness of breath dyspnea     with exertion  . Diabetes mellitus without complication     on meds  . Depression   . Anxiety   . Headache     stress headaches, migraines at times  . Arthritis     History  Smoking status  . Never Smoker   Smokeless tobacco  . Never Used    Family History  Problem Relation Age of Onset  . Diabetes Brother   . Diabetes Maternal Aunt   . Diabetes Paternal Aunt   . Diabetes Maternal Grandmother   . Diabetes Maternal Grandfather   . Diabetes Paternal Grandmother   . Diabetes Paternal Grandfather     Current Outpatient Prescriptions on File Prior to Visit  Medication Sig Dispense Refill  . ARIPiprazole (ABILIFY) 2 MG tablet Take 2 mg by mouth daily.    . beta carotene w/minerals (OCUVITE) tablet Take 1 tablet by mouth daily.    . Blood Glucose Monitoring Suppl (BLOOD GLUCOSE METER KIT AND SUPPLIES) Dispense based on patient and insurance preference. Use up to four times daily as directed. (FOR ICD-9 250.00, 250.01). 1 each 0  . buPROPion (WELLBUTRIN XL) 300 MG 24 hr tablet Take 300 mg by mouth daily.    . diazepam (VALIUM) 5 MG tablet Take 5  mg by mouth every 6 (six) hours as needed for anxiety.    . diazepam (VALIUM) 5 MG tablet Take 1 tablet (5 mg total) by mouth every 6 (six) hours as needed for muscle spasms. 50 tablet 1  . diclofenac sodium (VOLTAREN) 1 % GEL Apply 2 g topically 4 (four) times daily. 100 g 3  . diltiazem (CARDIZEM SR) 60 MG 12 hr capsule Take 1 capsule (60 mg total) by mouth 2 (two) times daily. 180 capsule 3  . docusate sodium (COLACE) 100 MG capsule Take 1 capsule (100 mg total) by mouth 2 (two) times daily. 60 capsule 0  . EPINEPHrine (EPIPEN) 0.3 mg/0.3 mL SOAJ Inject 0.3 mLs (0.3 mg total) into the muscle once. 1 Device 1  . fish oil-omega-3 fatty acids 1000 MG capsule Take 2 g by mouth daily.    . hydrochlorothiazide (HYDRODIURIL) 25 MG tablet Take 1 tablet (25 mg total) by mouth daily. 90 tablet 3  . hydrOXYzine (ATARAX/VISTARIL) 10 MG tablet Take 10 mg by mouth 3 (three) times daily as needed for itching.    Marland Kitchen ibuprofen (ADVIL,MOTRIN) 800 MG tablet Take 1 tablet (800 mg total) by mouth every 8 (eight) hours as needed. (Patient not taking: Reported on 05/31/2014) 30 tablet  0  . isosorbide mononitrate (IMDUR) 30 MG 24 hr tablet Take 30 mg by mouth daily.    . metFORMIN (GLUCOPHAGE) 500 MG tablet Take 1 tablet (500 mg total) by mouth 2 (two) times daily with a meal. 180 tablet 3  . metoprolol tartrate (LOPRESSOR) 25 MG tablet Take 1 tablet (25 mg total) by mouth 2 (two) times daily. 180 tablet 3  . milk thistle 175 MG tablet Take 350 mg by mouth daily.    . Multiple Vitamin (MULTIVITAMIN WITH MINERALS) TABS tablet Take 1 tablet by mouth daily.    Marland Kitchen omeprazole (PRILOSEC) 20 MG capsule take 1 capsule by mouth once daily 30 capsule 3  . oxyCODONE-acetaminophen (PERCOCET) 10-325 MG per tablet Take 1 tablet by mouth every 4 (four) hours as needed for pain. 100 tablet 0  . pregabalin (LYRICA) 75 MG capsule Take 75 mg by mouth 2 (two) times daily.    . SUMAtriptan (IMITREX) 50 MG tablet Take 1 tablet (50 mg total) by  mouth every 2 (two) hours as needed for migraine or headache. Do not exceed 200 mg daily. 10 tablet 0  . traMADol (ULTRAM) 50 MG tablet Take 1-2 tablets (50-100 mg total) by mouth every 6 (six) hours as needed. 60 tablet 0  . traZODone (DESYREL) 100 MG tablet Take 2 tablets (200 mg total) by mouth at bedtime. 180 tablet 3  . triamcinolone ointment (KENALOG) 0.5 % Apply 1 application topically 2 (two) times daily. X 1 week. 30 g 0   No current facility-administered medications on file prior to visit.    ROS: Per HPI.  All other systems reviewed and are negative.   Physical Exam Filed Vitals:   06/21/14 1604  BP: 110/70  Pulse: 95  Temp: 97.8 F (36.6 C)    Physical Examination:  Ankle: + 0.5 x 0.5 cystic formation at the lateral malleoli  Range of motion is full in all directions. Strength is 5/5 in all directions. Stable lateral and medial ligaments; squeeze test and kleiger test unremarkable; Talar dome nontender; No pain at base of 5th MT; No tenderness over cuboid; No tenderness over N spot or navicular prominence No tenderness on posterior aspects of lateral and medial malleolus No sign of peroneal tendon subluxations; Negative tarsal tunnel tinel's Able to walk 4 steps.  Neurovascularly intact RLE, pulses + 2

## 2014-06-21 NOTE — Telephone Encounter (Signed)
Called and spoke with patient.  Will change today's appt to Dr. Awanda Mink for same time.  Burna Forts, BSN, RN-BC

## 2014-06-22 NOTE — Progress Notes (Signed)
I was preceptor the day of this visit.   

## 2014-07-01 ENCOUNTER — Ambulatory Visit: Payer: Medicaid Other | Admitting: Family Medicine

## 2014-07-08 ENCOUNTER — Ambulatory Visit (HOSPITAL_BASED_OUTPATIENT_CLINIC_OR_DEPARTMENT_OTHER): Payer: Medicaid Other | Admitting: Physical Medicine & Rehabilitation

## 2014-07-08 ENCOUNTER — Encounter: Payer: Medicaid Other | Attending: Physical Medicine & Rehabilitation

## 2014-07-08 ENCOUNTER — Encounter: Payer: Self-pay | Admitting: Family Medicine

## 2014-07-08 ENCOUNTER — Encounter: Payer: Self-pay | Admitting: Physical Medicine & Rehabilitation

## 2014-07-08 ENCOUNTER — Other Ambulatory Visit: Payer: Self-pay | Admitting: Physical Medicine & Rehabilitation

## 2014-07-08 ENCOUNTER — Ambulatory Visit (INDEPENDENT_AMBULATORY_CARE_PROVIDER_SITE_OTHER): Payer: Medicaid Other | Admitting: Family Medicine

## 2014-07-08 VITALS — BP 136/80 | HR 90 | Resp 14

## 2014-07-08 VITALS — BP 128/79 | Ht 68.0 in | Wt 245.0 lb

## 2014-07-08 DIAGNOSIS — M79671 Pain in right foot: Secondary | ICD-10-CM

## 2014-07-08 DIAGNOSIS — Z5181 Encounter for therapeutic drug level monitoring: Secondary | ICD-10-CM | POA: Diagnosis not present

## 2014-07-08 DIAGNOSIS — G894 Chronic pain syndrome: Secondary | ICD-10-CM

## 2014-07-08 DIAGNOSIS — M5137 Other intervertebral disc degeneration, lumbosacral region: Secondary | ICD-10-CM | POA: Insufficient documentation

## 2014-07-08 DIAGNOSIS — Z79899 Other long term (current) drug therapy: Secondary | ICD-10-CM | POA: Diagnosis not present

## 2014-07-08 NOTE — Progress Notes (Signed)
  [facility] [address] [place], [place] [address] Phone: [phone] Fax: [phone]   Patient Name: Norma [name] Date of Birth: Nov 07, 1960 Medical Record Number: [id number] Gender: female Date of Encounter: 07/08/2014  SUBJECTIVE:  Norma [name] is a 54 y.o. very pleasant female patient who presents with right foot pain. She reports burning pain in plantar surface of right foot located in midfoot and radiating down to 1st Metatarsal head. Reports gradual worsening of pain over last 6 months. She denies any trauma. Has had previous bunion surgery on right 1st toe. Has DM2 that has been well controlled. Current on lyrica with minimal relief. Takes percocet for other chronic pain that has helped. Pain increases with walking and wearing shoes.   ROS:  denies any fevers chills night sweats  DATA REVIEWED:   PERTINENT PMH / PSH FH / / SH:  Past Medical, Surgical, Social, and Family History Reviewed & Updated per EMR. Pertinent Historical Findings include:    OBJECTIVE:  Filed Vitals:   07/08/14 1357  BP: 128/79   Filed Vitals:   07/08/14 1357  Height: 5\' 8"  (1.727 m)  Weight: 245 lb (111.131 kg)   Body mass index is 37.26 kg/(m^2).  Right Foot: Inspection:   Several calluses located on plantar surface under 1st and 5th rays. ~ 1.5 cm fibrous cyst located proximal to 1st MTP    Arch:pes  planus   Toes: hammer toe deformity on left 2nd toe  Palpation:   No pain at MT heads  Sensation: intact Vascular: intact w/ dorsalis pedis & posterior tibialis pulses 2+   ASSESSMENT & PLAN:  See problem based charting & AVS for pt instructions.  Phill Myron, MD  Patient seen and evaluated by Dr Nori Riis who agrees with the plan.

## 2014-07-08 NOTE — Progress Notes (Signed)
   Subjective:    Patient ID: Norma [name], female    DOB: [birth year], 54 y.o.   MRN: [id number]  HPI    Review of Systems     Objective:   Physical Exam        Assessment & Plan:

## 2014-07-08 NOTE — Progress Notes (Signed)
Patient ID: Norma [name], female   DOB: 07/30/1960, 54 y.o.   MRN: [id number] [place] Attending Note: I have seen and examined this patient. I have discussed this patient with the resident and reviewed the assessment and plan as documented above. I agree with the resident's findings and plan. Back surgery went well Will try MT pad for right forefoot pain

## 2014-07-08 NOTE — Assessment & Plan Note (Signed)
Burning pain in plantar surface of midfoot with radiation to 1st/2nd Ray. Hx of bunion surgery. Pain sounds neuropathic possibly medial plantar nerve. ? If related to fiberous cyst on medial sole or related to new shoes.  - Will place green insole with scaphoid pad today. If improves will discuss orthotics.

## 2014-07-08 NOTE — Progress Notes (Signed)
Subjective:    Patient ID: Norma [name], female    DOB: 05-Aug-1960, 54 y.o.   MRN: [id number]  HPI Email with history of chronic low back pain. She is seen by me in 2014. I advised epidural injections followed by surgical referral if she failed epidural injections. She did not wish to undergo epidural injections. Her back pain has worsened over time and she underwent left L5-S1 discectomy using micro-dissection 06/06/2014. She continues to follow with Dr. Arnoldo Morale, he is writing her postoperative medications.  Patient had this appointment set up a couple months ago before she underwent surgery. Thus far has not had any post operative physical therapy. She spent one night in the hospital and one home on 06/07/2014.  Patient has family assistance at home to help her with her bathing, cleaning and other household duties.  No falls since surgery. Has followed up with primary care physician for ankle cyst on the right side.   Pain Inventory Average Pain 10 Pain Right Now 6 My pain is sharp, dull and aching  In the last 24 hours, has pain interfered with the following? General activity 9 Relation with others 7 Enjoyment of life 9 What TIME of day is your pain at its worst? morning, evening  Sleep (in general) Poor  Pain is worse with: walking, bending and sitting Pain improves with: heat/ice, medication, TENS and injections Relief from Meds: 10  Mobility walk with assistance use a cane use a walker how many minutes can you walk? 5-10 ability to climb steps?  yes do you drive?  no transfers alone Do you have any goals in this area?  yes  Function not employed: date last employed . I need assistance with the following:  bathing and household duties  Neuro/Psych weakness numbness trouble walking anxiety  Prior Studies Any changes since last visit?  no  Physicians involved in your care Any changes since last visit?  no   Family History  Problem Relation Age of  Onset  . Diabetes Brother   . Diabetes Maternal Aunt   . Diabetes Paternal Aunt   . Diabetes Maternal Grandmother   . Diabetes Maternal Grandfather   . Diabetes Paternal Grandmother   . Diabetes Paternal Grandfather    History   Social History  . Marital Status: Single    Spouse Name: N/A  . Number of Children: N/A  . Years of Education: N/A   Social History Main Topics  . Smoking status: Never Smoker   . Smokeless tobacco: Never Used  . Alcohol Use: Yes     Comment: occasionally  . Drug Use: No     Comment: States no longer uses marijuana  . Sexual Activity: Yes   Other Topics Concern  . None   Social History Narrative   Past Surgical History  Procedure Laterality Date  . Ankle surgery    . Rotator cuff repair Left 02/20/14  . Tubal ligation    . Lumbar laminectomy/decompression microdiscectomy Left 06/06/2014    Procedure: LUMBAR LAMINECTOMY/DECOMPRESSION MICRODISCECTOMY 1 LEVEL;  Surgeon: Newman Pies, MD;  Location: [facility];  Service: Neurosurgery;  Laterality: Left;  Left L5S1 microdiskectomy   Past Medical History  Diagnosis Date  . Hypertension   . Acid reflux   . Tachyarrhythmia   . Left shoulder pain   . Back pain   . Trigger point of thoracic region 03/22/2012  . Irregular heart beat   . Shortness of breath dyspnea     with exertion  .  Diabetes mellitus without complication     on meds  . Depression   . Anxiety   . Headache     stress headaches, migraines at times  . Arthritis    BP 136/80 mmHg  Pulse 90  Resp 14  SpO2 99%  Opioid Risk Score: 2 Fall Risk Score:  `1  Depression screen PHQ 2/9  Depression screen [facility] 2/9 06/21/2014 05/08/2014 11/21/2013 09/04/2013 05/07/2013 03/12/2013 07/08/2011  Decreased Interest 0 (No Data) - 0 0 0 3  Down, Depressed, Hopeless 0 (No Data) 0 0 0 0 3  PHQ - 2 Score 0 - 0 0 0 0 6  Altered sleeping - - - - - - 3  Tired, decreased energy - - - - - - 3  Change in appetite - - - - - - 0  Feeling bad or  failure about yourself  - - - - - - 0  Trouble concentrating - - - - - - 3  Moving slowly or fidgety/restless - - - - - - 3  Suicidal thoughts - - - - - - 0  PHQ-9 Score - - - - - - 18     Review of Systems  Constitutional:       Night sweats  Musculoskeletal: Positive for gait problem.  Neurological: Positive for weakness and numbness.  Psychiatric/Behavioral: The patient is nervous/anxious.   All other systems reviewed and are negative.      Objective:   Physical Exam  Musculoskeletal:  Right ankle dorsiflexion and plantarflexion range of motion 75% Left ankle dorsi flexion plantarflexion range of motion 100%  Lumbar incision well healed no signs of drainage no erythema no fluctuance   Neurological:  Reflex Scores:      Patellar reflexes are 1+ on the right side and 1+ on the left side.      Achilles reflexes are 0 on the right side and 0 on the left side. Motor strength is 5/5 bilateral hip flexors and knee extensors ankle dorsiflex or plantar flexors as well as toe flexors extensor  Ambulates with a quad cane.           Assessment & Plan:  1. History of left L5 radiculopathy, Status post recent L5-S1 discectomy approximately one month ago. Overall functioning at a better level she can walk better. She still has postoperative pain. As I discussed with patient I would like her to follow up with neurosurgery have them manage her postoperative recovery. If she experiences continued chronic low back and left lower extremity pain after discharge from neurosurgery service, I'll be happy to see her back to look at other treatment options. Her neurosurgeon Dr. Arnoldo Morale will be prescribing her pain medications for the postoperative period. We also discussed that she may benefit from physical therapy In the meantime recommend up as much as possible switching positions frequently from sitting to standing as well as walking.

## 2014-07-08 NOTE — Patient Instructions (Signed)
If you continue low back pain radiating to leg after Dr. Arnoldo Morale feels like you have adequately healed from surgery, give [place] call and make an appointment.

## 2014-07-09 LAB — PMP ALCOHOL METABOLITE (ETG)

## 2014-07-10 ENCOUNTER — Other Ambulatory Visit: Payer: Self-pay | Admitting: Family Medicine

## 2014-07-12 LAB — BENZODIAZEPINES (GC/LC/MS), URINE
Alprazolam metabolite (GC/LC/MS), ur confirm: NEGATIVE ng/mL (ref <25)
Clonazepam metabolite (GC/LC/MS), ur confirm: NEGATIVE ng/mL (ref <25)
Flurazepam metabolite (GC/LC/MS), ur confirm: NEGATIVE ng/mL (ref <50)
Lorazepam (GC/LC/MS), ur confirm: NEGATIVE ng/mL (ref <50)
Midazolam (GC/LC/MS), ur confirm: NEGATIVE ng/mL (ref <50)
NORDIAZEPAMU: 360 ng/mL — AB (ref <50)
Oxazepam (GC/LC/MS), ur confirm: 666 ng/mL — AB (ref <50)
Temazepam (GC/LC/MS), ur confirm: 1009 ng/mL — AB (ref <50)
Triazolam metabolite (GC/LC/MS), ur confirm: NEGATIVE ng/mL (ref <50)

## 2014-07-12 LAB — ETHYL GLUCURONIDE, URINE
Ethyl Glucuronide (EtG): NEGATIVE ng/mL (ref <500)
Ethyl Sulfate (ETS): 1358 ng/mL — ABNORMAL HIGH (ref <100)

## 2014-07-13 LAB — PRESCRIPTION MONITORING PROFILE (SOLSTAS)
AMPHETAMINE/METH: NEGATIVE ng/mL
BARBITURATE SCREEN, URINE: NEGATIVE ng/mL
BUPRENORPHINE, URINE: NEGATIVE ng/mL
CARISOPRODOL, URINE: NEGATIVE ng/mL
Cannabinoid Scrn, Ur: NEGATIVE ng/mL
Cocaine Metabolites: NEGATIVE ng/mL
Creatinine, Urine: 275.73 mg/dL (ref >20.0)
FENTANYL URINE: NEGATIVE ng/mL
MDMA URINE: NEGATIVE ng/mL
Meperidine, Ur: NEGATIVE ng/mL
Methadone Screen, Urine: NEGATIVE ng/mL
NITRITES URINE, INITIAL: NEGATIVE ug/mL
OPIATE SCREEN, URINE: NEGATIVE ng/mL
Oxycodone Screen, Ur: NEGATIVE ng/mL
Propoxyphene: NEGATIVE ng/mL
TAPENTADOLUR: NEGATIVE ng/mL
Tramadol Scrn, Ur: NEGATIVE ng/mL
ZOLPIDEM, URINE: NEGATIVE ng/mL
pH, Initial: 5.8 pH (ref 4.5–8.9)

## 2014-07-19 ENCOUNTER — Ambulatory Visit (INDEPENDENT_AMBULATORY_CARE_PROVIDER_SITE_OTHER): Payer: Medicaid Other | Admitting: Family Medicine

## 2014-07-19 ENCOUNTER — Encounter: Payer: Self-pay | Admitting: Family Medicine

## 2014-07-19 VITALS — BP 123/84 | HR 80 | Temp 98.1°F | Ht 68.0 in | Wt 268.5 lb

## 2014-07-19 DIAGNOSIS — L309 Dermatitis, unspecified: Secondary | ICD-10-CM

## 2014-07-19 DIAGNOSIS — R21 Rash and other nonspecific skin eruption: Secondary | ICD-10-CM

## 2014-07-19 DIAGNOSIS — L301 Dyshidrosis [pompholyx]: Secondary | ICD-10-CM

## 2014-07-19 MED ORDER — HYDROXYZINE PAMOATE 25 MG PO CAPS: 25.0000 mg | ORAL_CAPSULE | Freq: Four times a day (QID) | ORAL | Status: DC | PRN

## 2014-07-19 MED ORDER — CLOBETASOL PROPIONATE 0.05 % EX OINT: 1.0000 "application " | TOPICAL_OINTMENT | Freq: Two times a day (BID) | CUTANEOUS | Status: DC

## 2014-07-19 NOTE — Progress Notes (Signed)
   Subjective:    Patient ID: Norma [name], female    DOB: Feb 14, 1961, 54 y.o.   MRN: [id number]  Seen for Same day visit for   CC: Rash  She comes in today complaining of rash has been ongoing for the last month.  Rash is located on her chest and bilateral forearms and upper arms.  Reports it started when the pollen began getting bad.  She has been using over-the-counter antihistamine medications for which with minimal relief.  She also reports using triamcinolone cream intermittently for the last month with minimal improvement.  He denies any previous similar rashes.  Denies any history of eczema.  Denies fevers, chills, joint pains.  Has any new contacts or exposures.  Denies any recent medication changes.  Reports history of seasonal allergies, family history of eczema in her children. ;   Review of Systems   See HPI for ROS. Objective:  BP 123/84 mmHg  Pulse 80  Temp(Src) 98.1 F (36.7 C) (Oral)  Ht 5\' 8"  (1.727 m)  Wt 268 lb 8 oz (121.791 kg)  BMI 40.83 kg/m2  General: NAD Skin: Moderate erythema and dryness noted on anterior chest and bilateral arms.  No warmth or swelling.  No scaling.  Nails normal.  No dryness or scaling on extensor surfaces are behind ears   Assessment & Plan:  See Problem List Documentation

## 2014-07-19 NOTE — Assessment & Plan Note (Signed)
Pertinent S&O  erythema and dryness on the anterior chest and bilateral arms x 1 month   No improvement with triamcinolone cream  Associated with seasonal allergies  No signs of infection, joint problems or new medications or exposures Assessment  Most likely eczema; however would've expected this to respond to triamcinolone cream  No evidence of joint involvement or rash on face to indicate rheumatological pathology Plan  Clobetasol cream 1 week  Recommended avoiding hot showers, and applying Vaseline 3 times a day when necessary for skin moisturization  Continue OTC antihistamine  Vistaril for itching  Follow-up 1-2 weeks if not improving

## 2014-07-19 NOTE — Patient Instructions (Addendum)
It was great seeing you today.   1. Use vaseline daily on dry skin 2. Apply Clobetasol steroid cream twice a day for 7 days on red areas on chest and forearm. Do not use on face or longer than 1 week.  3. Follow up in 1-2 weeks if not improving.   If you have any questions or concerns before then, please call the clinic at [phone].  Take Care,   Dr Phill Myron

## 2014-07-24 NOTE — Progress Notes (Addendum)
Urine drug screen for this encounter is negative for percocet and positive for prescribed valium.  We are not prescribing medication for this patient

## 2014-08-01 ENCOUNTER — Other Ambulatory Visit: Payer: Self-pay | Admitting: Family Medicine

## 2014-08-01 ENCOUNTER — Telehealth: Payer: Self-pay | Admitting: Student

## 2014-08-01 NOTE — Telephone Encounter (Signed)
Refill request. Will forward to PCP for review. Tekoa Amon, CMA. 

## 2014-08-01 NOTE — Telephone Encounter (Signed)
Need ibuprofen refill

## 2014-08-02 ENCOUNTER — Ambulatory Visit (INDEPENDENT_AMBULATORY_CARE_PROVIDER_SITE_OTHER): Payer: Medicaid Other | Admitting: Family Medicine

## 2014-08-02 VITALS — BP 134/86 | HR 73 | Temp 98.3°F | Ht 68.0 in | Wt 249.6 lb

## 2014-08-02 DIAGNOSIS — R0782 Intercostal pain: Secondary | ICD-10-CM | POA: Diagnosis not present

## 2014-08-02 DIAGNOSIS — M7918 Myalgia, other site: Secondary | ICD-10-CM

## 2014-08-02 MED ORDER — IBUPROFEN 800 MG PO TABS: 800.0000 mg | ORAL_TABLET | Freq: Three times a day (TID) | ORAL | Status: DC | PRN

## 2014-08-02 MED ORDER — CYCLOBENZAPRINE HCL 10 MG PO TABS: 10.0000 mg | ORAL_TABLET | Freq: Three times a day (TID) | ORAL | Status: DC | PRN

## 2014-08-02 NOTE — Assessment & Plan Note (Signed)
Likely MSK etiology, suspect from nightly cough she has been having over the same time frame as symptom development. No evidence for UTI/pyelonephritis. Discussed conservative management with OTC pain meds, ice/heating pads, flexeril, OTC cough/cold medicines (avoid "drowsy"/benadryl containing formulations while taking flexeril), asked her to talk to surgeon at f/u 2 weeks about PT referral since this has not been setup since her surgery. Return precautions for developing rash. F/u as needed, or at her earliest convenience for annual visit (due for pap, colonoscopy, vaccines).

## 2014-08-02 NOTE — Progress Notes (Signed)
   Subjective:    Patient ID: Norma [name], female    DOB: 1961/02/24, 54 y.o.   MRN: [id number]  HPI  Patient presents for Same Day Appointment  CC: back and side pain  # Side pain:  Back pain is chronic (Had bulging disc repair surgery in February 2016)  Side pain 2 weeks. Right side worse than left. Did not do anything she can think of to bring it on, no heavy lifting, no exercise  Feels like sharp/dull, worse with certain movements like bending down or twisting.  Denies dysuria, no increased frequency, no rashes  Medications tried: norco 2 times 10-325mg  tablets; helps for a little while  No numbness or tingling in legs (does take lyrica for pain in feet/legs) ROS: SOB with walking, cough at night laying down (just started recently)  Review of Systems   See HPI for ROS. All other systems reviewed and are negative.  Past medical history, surgical, family, and social history reviewed and updated in the EMR as appropriate.  Objective:  BP 134/86 mmHg  Pulse 73  Temp(Src) 98.3 F (36.8 C) (Oral)  Ht 5\' 8"  (1.727 m)  Wt 249 lb 9.6 oz (113.218 kg)  BMI 37.96 kg/m2 Vitals and nursing note reviewed  General: NAD CV: RRR, nl s1s2 no mrg Resp: Clear bilaterally, no w/r/c, normal effort  Back: mildly tender lateral lower ribs to deep palpation bilaterally Ext: no edema Skin: no rashes on back Neuro: alert and oriented. Strength 5/5 bilaterally LE. 2+ patellar reflexes. No deficits noted  Assessment & Plan:  See Problem List Documentation

## 2014-08-02 NOTE — Patient Instructions (Signed)
Likely muscle pain from your ribs from coughing.   Flexeril in addition to over the counter medicines. Can use ice or heating pads.  Talk to your neurosurgeon about physical therapy at your visit in a few weeks.

## 2014-08-05 NOTE — Progress Notes (Signed)
I was preceptor the day of this visit.   

## 2014-08-06 ENCOUNTER — Other Ambulatory Visit: Payer: Self-pay | Admitting: Student

## 2014-08-06 DIAGNOSIS — Z1231 Encounter for screening mammogram for malignant neoplasm of breast: Secondary | ICD-10-CM

## 2014-08-07 ENCOUNTER — Telehealth: Payer: Self-pay | Admitting: Student

## 2014-08-07 NOTE — Telephone Encounter (Signed)
Will forward to MD to make her aware.  Jazmin Hartsell,CMA  

## 2014-08-07 NOTE — Telephone Encounter (Signed)
She was seen at rehab center and was told that she has Diabetes and not just pre-diabetic, and needed an appointment for sleep apnea. "Wondering why the doctors here are giving her wrong information." / thanks [employer], ASA

## 2014-08-14 ENCOUNTER — Ambulatory Visit (HOSPITAL_COMMUNITY): Payer: Medicaid Other

## 2014-08-21 ENCOUNTER — Ambulatory Visit (HOSPITAL_COMMUNITY)
Admission: RE | Admit: 2014-08-21 | Discharge: 2014-08-21 | Disposition: A | Discharge status: Home / Self Care | Payer: Medicaid Other | Source: Ambulatory Visit | Attending: Internal Medicine | Admitting: Internal Medicine

## 2014-08-21 DIAGNOSIS — Z1231 Encounter for screening mammogram for malignant neoplasm of breast: Secondary | ICD-10-CM | POA: Insufficient documentation

## 2014-08-27 ENCOUNTER — Encounter: Payer: Self-pay | Admitting: Family Medicine

## 2014-08-27 ENCOUNTER — Telehealth: Payer: Self-pay | Admitting: Student

## 2014-08-27 ENCOUNTER — Ambulatory Visit (INDEPENDENT_AMBULATORY_CARE_PROVIDER_SITE_OTHER): Payer: Medicaid Other | Admitting: Family Medicine

## 2014-08-27 VITALS — BP 124/82 | HR 79 | Temp 98.1°F | Ht 68.0 in | Wt 250.0 lb

## 2014-08-27 DIAGNOSIS — E669 Obesity, unspecified: Secondary | ICD-10-CM | POA: Diagnosis not present

## 2014-08-27 DIAGNOSIS — G8929 Other chronic pain: Secondary | ICD-10-CM | POA: Diagnosis not present

## 2014-08-27 DIAGNOSIS — R7309 Other abnormal glucose: Secondary | ICD-10-CM

## 2014-08-27 DIAGNOSIS — R1010 Upper abdominal pain, unspecified: Secondary | ICD-10-CM | POA: Diagnosis not present

## 2014-08-27 DIAGNOSIS — R7303 Prediabetes: Secondary | ICD-10-CM

## 2014-08-27 DIAGNOSIS — R109 Unspecified abdominal pain: Principal | ICD-10-CM

## 2014-08-27 LAB — POCT URINALYSIS DIPSTICK
Bilirubin, UA: NEGATIVE
GLUCOSE UA: NEGATIVE
Ketones, UA: NEGATIVE
NITRITE UA: NEGATIVE
PH UA: 6
Protein, UA: 100
Spec Grav, UA: >=1.03
UROBILINOGEN UA: 0.2

## 2014-08-27 LAB — POCT UA - MICROSCOPIC ONLY

## 2014-08-27 NOTE — Assessment & Plan Note (Signed)
Acute on chronic flank pain. ?? Related to recent back surgery which made her pain worsened. UA checked today to r/o kidney stone. UA showed intact trace blood with proteinuria. Did not feel compelled to get kidney U/S at this time. Proteinuria discussed with her. She is advised to return for repeat UA with her PCP.  Since she has DM/HTN might benefit from ACE1 If +blood in UA at recheck she will benefit from kidney U/S. Return precaution discussed.

## 2014-08-27 NOTE — Assessment & Plan Note (Signed)
As discussed with patient, I don't know her well to be able to give her a good answer. Reviewing her A1C in the last 3 yrs it has been less than 6. It could be that this is due to the fact that she is on medication or that she does not have DM. I suggested she return to discuss with her PCP to try to taper her off Metformin and see how she does with her A1C. She agreed with plan.

## 2014-08-27 NOTE — Progress Notes (Signed)
Subjective:     Patient ID: Norma [name], female   DOB: November 29, 1960, 54 y.o.   MRN: [id number]  HPI Flank pain: Right side. Movement makes pain worse. Uses soft pillow to sleep to help with. She uses bunch of pain medicine with minimal improvement. Pain is about 10/10 in severity. Pain is dull and sharp in nature, at times radiates up to her shoulder blade. Pain has been on going for 2 yrs but now worsening. No urinary symptoms. No blood in her urine. She recently had back surgery. DM2: She stated she was told she is prediabetic but not clear why she is on medication. A different doctor told her she has DM since she is on medication. She has been on Metformin for more than one year. Denies any other concern. Obesity: Patient is concern about her weight, requesting weight loss medicine.  Current Outpatient Prescriptions on File Prior to Visit  Medication Sig Dispense Refill  . ARIPiprazole (ABILIFY) 2 MG tablet Take 2 mg by mouth daily.    Marland Kitchen buPROPion (WELLBUTRIN XL) 300 MG 24 hr tablet Take 300 mg by mouth daily.    . cyclobenzaprine (FLEXERIL) 10 MG tablet take 1 tablet by mouth three times a day if needed for muscle spasm 90 tablet 0  . diazepam (VALIUM) 5 MG tablet Take 5 mg by mouth every 6 (six) hours as needed for anxiety.    . diclofenac sodium (VOLTAREN) 1 % GEL Apply 2 g topically 4 (four) times daily. 100 g 3  . diltiazem (CARDIZEM SR) 60 MG 12 hr capsule Take 1 capsule (60 mg total) by mouth 2 (two) times daily. 180 capsule 3  . fish oil-omega-3 fatty acids 1000 MG capsule Take 2 g by mouth daily.    . hydrochlorothiazide (HYDRODIURIL) 25 MG tablet Take 1 tablet (25 mg total) by mouth daily. 90 tablet 3  . HYDROcodone-acetaminophen (NORCO/VICODIN) 5-325 MG per tablet   0  . isosorbide mononitrate (IMDUR) 30 MG 24 hr tablet Take 30 mg by mouth daily.    . metFORMIN (GLUCOPHAGE) 500 MG tablet Take 1 tablet (500 mg total) by mouth 2 (two) times daily with a meal. 180 tablet 3  .  metoprolol tartrate (LOPRESSOR) 25 MG tablet Take 1 tablet (25 mg total) by mouth 2 (two) times daily. 180 tablet 3  . Multiple Vitamin (MULTIVITAMIN WITH MINERALS) TABS tablet Take 1 tablet by mouth daily.    Marland Kitchen omeprazole (PRILOSEC) 20 MG capsule take 1 capsule by mouth once daily 30 capsule 6  . oxyCODONE-acetaminophen (PERCOCET) 10-325 MG per tablet Take 1 tablet by mouth every 4 (four) hours as needed for pain. 100 tablet 0  . pregabalin (LYRICA) 75 MG capsule Take 75 mg by mouth 2 (two) times daily.    . traMADol (ULTRAM) 50 MG tablet Take 1-2 tablets (50-100 mg total) by mouth every 6 (six) hours as needed. 60 tablet 0  . beta carotene w/minerals (OCUVITE) tablet Take 1 tablet by mouth daily.    . Blood Glucose Monitoring Suppl (BLOOD GLUCOSE METER KIT AND SUPPLIES) Dispense based on patient and insurance preference. Use up to four times daily as directed. (FOR ICD-9 250.00, 250.01). 1 each 0  . clobetasol ointment (TEMOVATE) 3.47 % Apply 1 application topically 2 (two) times daily. 30 g 0  . cyclobenzaprine (FLEXERIL) 10 MG tablet Take 1 tablet (10 mg total) by mouth 3 (three) times daily as needed for muscle spasms. 30 tablet 1  . diazepam (VALIUM) 5 MG tablet  Take 1 tablet (5 mg total) by mouth every 6 (six) hours as needed for muscle spasms. 50 tablet 1  . EPINEPHrine (EPIPEN) 0.3 mg/0.3 mL SOAJ Inject 0.3 mLs (0.3 mg total) into the muscle once. (Patient not taking: Reported on 08/27/2014) 1 Device 1  . hydrOXYzine (ATARAX/VISTARIL) 10 MG tablet Take 10 mg by mouth 3 (three) times daily as needed for itching.    . hydrOXYzine (VISTARIL) 25 MG capsule Take 1 capsule (25 mg total) by mouth every 6 (six) hours as needed for itching. (Patient not taking: Reported on 08/27/2014) 30 capsule 0  . ibuprofen (ADVIL,MOTRIN) 800 MG tablet Take 1 tablet (800 mg total) by mouth every 8 (eight) hours as needed. (Patient not taking: Reported on 08/27/2014) 30 tablet 0  . milk thistle 175 MG tablet Take 350  mg by mouth daily.    . naproxen (NAPROSYN) 500 MG tablet Take 500 mg by mouth 2 (two) times daily with a meal.  0  . oxyCODONE-acetaminophen (PERCOCET/ROXICET) 5-325 MG per tablet   0  . SUMAtriptan (IMITREX) 50 MG tablet Take 1 tablet (50 mg total) by mouth every 2 (two) hours as needed for migraine or headache. Do not exceed 200 mg daily. (Patient not taking: Reported on 08/27/2014) 10 tablet 0  . traZODone (DESYREL) 100 MG tablet Take 2 tablets (200 mg total) by mouth at bedtime. 180 tablet 3  . triamcinolone ointment (KENALOG) 0.5 % Apply 1 application topically 2 (two) times daily. X 1 week. 30 g 0   No current facility-administered medications on file prior to visit.   Past Medical History  Diagnosis Date  . Hypertension   . Acid reflux   . Tachyarrhythmia   . Left shoulder pain   . Back pain   . Trigger point of thoracic region 03/22/2012  . Irregular heart beat   . Shortness of breath dyspnea     with exertion  . Diabetes mellitus without complication     on meds  . Depression   . Anxiety   . Headache     stress headaches, migraines at times  . Arthritis      Review of Systems  Respiratory: Negative.   Cardiovascular: Negative.   Gastrointestinal: Negative.   Genitourinary: Positive for flank pain.  Neurological: Negative.   All other systems reviewed and are negative.      Filed Vitals:   08/27/14 1052  BP: 124/82  Pulse: 79  Temp: 98.1 F (36.7 C)  TempSrc: Oral  Height: _0  (1.727 m)  Weight: 250 lb (113.399 kg)    Objective:   Physical Exam  Constitutional: She appears well-developed. No distress.  Cardiovascular: Normal rate, regular rhythm and normal heart sounds.   No murmur heard. Pulmonary/Chest: Effort normal and breath sounds normal. No respiratory distress. She has no wheezes. She exhibits no tenderness.  Abdominal: Soft. Bowel sounds are normal. She exhibits no distension and no mass. There is no tenderness. There is no CVA tenderness.     Musculoskeletal: Normal range of motion.  Nursing note and vitals reviewed.      Assessment:     Flank pain DM2 Obesity     Plan:     Check problem list.

## 2014-08-27 NOTE — Telephone Encounter (Signed)
Will forward to Tomasa Hosteller, RN and MD who saw patient this morning to make her aware of increased pain.  Jazmin Hartsell,CMA

## 2014-08-27 NOTE — Assessment & Plan Note (Signed)
I recommended nutritionist evaluation instead of medication. I placed referral.

## 2014-08-27 NOTE — Patient Instructions (Signed)
It was nice seeing you today, I am sorry about your flank pain. Let us check your urine to ensure you don't have a stone. If all is normal, the pain might be related to the recent back surgery you had. Please call for follow up with your PCP soon.

## 2014-08-27 NOTE — Telephone Encounter (Signed)
Asking to speak with Norma [name] (specifically asked for her), says she is in more pain now than when she was here, was told she has protein in her urine, needs to know what she is supposed to do

## 2014-08-27 NOTE — Telephone Encounter (Signed)
Please have her go to the ED to be seen if pain is worse. She might need and U/S of her Kidneys but her last kidney function test was normal. She already has pain medicine, she should try and use it as well.

## 2014-08-28 NOTE — Telephone Encounter (Signed)
Returned call to patient and left message to call our office back.  Burna Forts, BSN, RN-BC

## 2014-08-28 NOTE — Telephone Encounter (Signed)
Patient calling back.  Patient has completed a Patient Feedback Form after her visit with Dr. Gwendlyn Deutscher stating "need a referral to a doctor about wide-spread pain.  I had tried to get disability for three years.  I also have a lawyer and no one will help me.  In need of a real doctor."  Patient states she had back surgery for bulging disk by Dr. Arnoldo Morale.  Has not been seeing pain clinic doctor until she is released Dr. Dr. Arnoldo Morale care.  Does not have another appt with Dr. Arnoldo Morale until 11/2014.  Patient has an appt on 08/10/14 with Dr. Lincoln Brigham.  Informed her that Dr. Lincoln Brigham would examine her at that time and discuss her pain management options at that time and see if an additional referral is needed.  Patient verbalized understanding.  Will route note to Dr. Lincoln Brigham and call patient back with additional instructions/advice.  Jazmin Hartsell, CMA awaiting me to contact patient and relay dditional info below per Dr. Gwendlyn Deutscher.  Burna Forts, BSN, RN-BC

## 2014-08-29 ENCOUNTER — Encounter: Payer: Self-pay | Admitting: *Deleted

## 2014-08-29 ENCOUNTER — Telehealth: Payer: Self-pay | Admitting: *Deleted

## 2014-08-29 NOTE — Telephone Encounter (Signed)
Pt called back to answer question. She did not answer. Message was left for her to call back with questions  Riggins Cisek A. Lincoln Brigham MD, [facility] Resident PGY-1 Pager [phone]

## 2014-08-29 NOTE — Telephone Encounter (Signed)
-----   Message from Kinnie Feil, MD sent at 08/27/2014  4:46 PM EDT ----- Please call patient to let her know I referred her to Dr Jenne Campus for nutrition appointment but she will need to call her for appointment. Also give her Dr Jenne Campus phone number. Thank you.

## 2014-08-29 NOTE — Telephone Encounter (Signed)
Letter mailed to patient. Zamariah Seaborn,CMA  

## 2014-09-02 ENCOUNTER — Ambulatory Visit: Payer: Medicaid Other | Admitting: Family Medicine

## 2014-09-04 ENCOUNTER — Other Ambulatory Visit: Payer: Self-pay | Admitting: *Deleted

## 2014-09-04 MED ORDER — DILTIAZEM HCL ER 60 MG PO CP12: 60.0000 mg | ORAL_CAPSULE | Freq: Two times a day (BID) | ORAL | Status: DC

## 2014-09-09 ENCOUNTER — Encounter: Payer: Medicaid Other | Admitting: Student

## 2014-09-09 ENCOUNTER — Other Ambulatory Visit: Payer: Self-pay | Admitting: Family Medicine

## 2014-09-10 ENCOUNTER — Telehealth: Payer: Self-pay | Admitting: *Deleted

## 2014-09-10 ENCOUNTER — Telehealth: Payer: Self-pay | Admitting: Student

## 2014-09-10 NOTE — Telephone Encounter (Signed)
-----   Message from [name], MD sent at 09/02/2014 10:10 AM EDT ----- Regarding: RE: Protein in urine Lavone Nian,  We could go ahead and check these, but there is nothing to be done urgently.  If she were seeing me, I would just have the CMA get a urine sample for UA and microalbumin at beginning of visit.  If she is very anxious, we could get it the week before she is seen so we could talk about results during the visit.  Thanks, Levada Dy  ----- Message -----    From: Thana Ates, RN    Sent: 09/02/2014   8:59 AM      To: [name], MD, [name], MD Subject: Protein in urine                               Patient concerned about protein in urine.  Had SDA appt with Dr. Lacinda Axon, but appt canceled due to needs to follow up with PCP.  Appt scheduled for 09/17/14 at 11:00 am.  Patient would like to have lab test to recheck protein (u/a or microalbumin?).  Are either of these tests ok to check before she sees you.  Patient is very anxious and concerned.    Tomasa Hosteller

## 2014-09-10 NOTE — Telephone Encounter (Signed)
Called and informed patient that Dr. Lincoln Brigham will check urine and microalbumin at her follow-up appt next week.  Patient verbalized understanding.  Burna Forts, BSN, RN-BC

## 2014-09-10 NOTE — Telephone Encounter (Signed)
Pt called regarding her concern for her proteinuria. Left message that we would discuss labs and rechecking at h er next visit on 5/31   Hallelujah Wysong A. Lincoln Brigham MD, [facility] Resident PGY-1 Pager [phone]

## 2014-09-17 ENCOUNTER — Encounter: Payer: Self-pay | Admitting: Student

## 2014-09-17 ENCOUNTER — Ambulatory Visit (INDEPENDENT_AMBULATORY_CARE_PROVIDER_SITE_OTHER): Payer: Medicaid Other | Admitting: Student

## 2014-09-17 VITALS — BP 142/74 | HR 77 | Temp 98.6°F | Ht 68.0 in | Wt 248.0 lb

## 2014-09-17 DIAGNOSIS — R809 Proteinuria, unspecified: Secondary | ICD-10-CM | POA: Diagnosis not present

## 2014-09-17 DIAGNOSIS — M549 Dorsalgia, unspecified: Secondary | ICD-10-CM | POA: Diagnosis not present

## 2014-09-17 DIAGNOSIS — R21 Rash and other nonspecific skin eruption: Secondary | ICD-10-CM | POA: Diagnosis not present

## 2014-09-17 MED ORDER — TRAZODONE HCL 100 MG PO TABS: 200.0000 mg | ORAL_TABLET | Freq: Every day | ORAL | Status: DC

## 2014-09-17 MED ORDER — TRIAMCINOLONE ACETONIDE 0.5 % EX OINT: 1.0000 "application " | TOPICAL_OINTMENT | Freq: Two times a day (BID) | CUTANEOUS | Status: DC

## 2014-09-17 MED ORDER — TRAMADOL HCL 50 MG PO TABS: 50.0000 mg | ORAL_TABLET | Freq: Four times a day (QID) | ORAL | Status: DC | PRN

## 2014-09-17 NOTE — Patient Instructions (Signed)
RTC in 3 months Please call with questions or concerns

## 2014-09-17 NOTE — Progress Notes (Signed)
   Subjective:    Patient ID: Norma [name], female    DOB: 10/05/1960, 54 y.o.   MRN: [id number]   CC: follow up Proteinuria  HPI 54 y/o with PMH of herniated L5-S1, s/p discectomy on 2/18 and being followed by neursurgery Presents for follow up of proteinuria found on UA at last visit Reports continued stab;e back and shoulder pain and requests refill of tramadol as well as trazodone. Denies other complaints  Review of Systems   See HPI for ROS.   Past Medical History  Diagnosis Date  . Hypertension   . Acid reflux   . Tachyarrhythmia   . Left shoulder pain   . Back pain   . Trigger point of thoracic region 03/22/2012  . Irregular heart beat   . Shortness of breath dyspnea     with exertion  . Diabetes mellitus without complication     on meds  . Depression   . Anxiety   . Headache     stress headaches, migraines at times  . Arthritis     History   Social History  . Marital Status: Single    Spouse Name: N/A  . Number of Children: N/A  . Years of Education: N/A   Occupational History  . Not on file.   Social History Main Topics  . Smoking status: Never Smoker   . Smokeless tobacco: Never Used  . Alcohol Use: Yes     Comment: occasionally  . Drug Use: No     Comment: States no longer uses marijuana  . Sexual Activity: Yes   Other Topics Concern  . Not on file   Social History Narrative    Objective:  BP 142/74 mmHg  Pulse 77  Temp(Src) 98.6 F (37 C) (Oral)  Ht 5\' 8"  (1.727 m)  Wt 248 lb (112.492 kg)  BMI 37.72 kg/m2 Vitals and nursing note reviewed  General: NAD Cardiac: RRR, normal heart sounds, no murmurs.  Respiratory: CTAB, normal effort Abdomen: soft, nontender, nondistended, no hepatic or splenomegaly.  Extremities: no edema or cyanosis. WWP. Skin: warm and dry, no rashes noted Neuro: alert and oriented, no focal deficits   Assessment & Plan:  See Problem List      Laressa Bolinger A. Lincoln Brigham MD, [facility] Resident  PGY-1 Pager [phone]

## 2014-09-17 NOTE — Assessment & Plan Note (Addendum)
-   P:C ratio collected today - If continued proteinuria will consider adding ACE or ARB to blood pressure regimen and recheck BMP for creatinine, last creatinine 05/2014 wnl

## 2014-09-18 LAB — PROTEIN / CREATININE RATIO, URINE
Creatinine, Urine: 340.2 mg/dL
PROTEIN CREATININE RATIO: 0.05 (ref <0.15)
TOTAL PROTEIN, URINE: 18 mg/dL (ref 5–24)

## 2014-09-19 ENCOUNTER — Telehealth: Payer: Self-pay | Admitting: Student

## 2014-09-19 NOTE — Telephone Encounter (Signed)
Pt called regarding urine P:C. As she did not answer a message was left on her voice mail to call the clinic with questions  Rakisha Pincock A. Lincoln Brigham MD, [facility] Resident PGY-1

## 2014-10-10 ENCOUNTER — Other Ambulatory Visit (HOSPITAL_COMMUNITY)
Admission: RE | Admit: 2014-10-10 | Discharge: 2014-10-10 | Disposition: A | Discharge status: Home / Self Care | Payer: Medicaid Other | Source: Ambulatory Visit | Attending: Family Medicine | Admitting: Family Medicine

## 2014-10-10 ENCOUNTER — Ambulatory Visit (INDEPENDENT_AMBULATORY_CARE_PROVIDER_SITE_OTHER): Payer: Medicaid Other | Admitting: Student

## 2014-10-10 ENCOUNTER — Encounter: Payer: Self-pay | Admitting: Student

## 2014-10-10 VITALS — BP 115/79 | HR 87 | Ht 68.0 in | Wt 240.5 lb

## 2014-10-10 DIAGNOSIS — Z01419 Encounter for gynecological examination (general) (routine) without abnormal findings: Secondary | ICD-10-CM | POA: Diagnosis not present

## 2014-10-10 DIAGNOSIS — M545 Low back pain, unspecified: Secondary | ICD-10-CM

## 2014-10-10 DIAGNOSIS — Z113 Encounter for screening for infections with a predominantly sexual mode of transmission: Secondary | ICD-10-CM | POA: Insufficient documentation

## 2014-10-10 DIAGNOSIS — Z0000 Encounter for general adult medical examination without abnormal findings: Secondary | ICD-10-CM | POA: Diagnosis not present

## 2014-10-10 DIAGNOSIS — Z1151 Encounter for screening for human papillomavirus (HPV): Secondary | ICD-10-CM | POA: Diagnosis present

## 2014-10-10 DIAGNOSIS — Z124 Encounter for screening for malignant neoplasm of cervix: Secondary | ICD-10-CM | POA: Diagnosis not present

## 2014-10-10 DIAGNOSIS — M79605 Pain in left leg: Secondary | ICD-10-CM

## 2014-10-10 DIAGNOSIS — Z202 Contact with and (suspected) exposure to infections with a predominantly sexual mode of transmission: Secondary | ICD-10-CM

## 2014-10-10 LAB — RPR

## 2014-10-10 NOTE — Patient Instructions (Signed)
Please follow up in 6 months You will be called is results are abnormal Please call with questions or concerns

## 2014-10-10 NOTE — Assessment & Plan Note (Signed)
Pt given note for Medicaid Transportation so that she may use a transit bus given she has pain with standing for prolonged times at bus times. - Will continue to follow

## 2014-10-10 NOTE — Progress Notes (Signed)
   Subjective:    Patient ID: Norma [name], female    DOB: 1960/07/05, 54 y.o.   MRN: [id number]   CC: Desires pap smear  HPI: 54 y/o seen for pap smear. Addionally asked for form to request medicaid transportation as her chronic back pain keeps her from standing at the bus stop for prolonged periods of time  Norma [name] desires STD testing with RPR, HIV, GC/Ct. She states that she has been sexually active wit the same partner and while she is not concerned for infidelity, she " just likes to check periodically to be sure"  Norma [name] denies vaginal irritation, discharge, abdominal pain. States that her back pain is not bothering today  Review of Systems   See HPI for ROS.   Past medical history, surgical, family, and social history reviewed and updated in the EMR as appropriate.  Past Medical History  Diagnosis Date  . Hypertension   . Acid reflux   . Tachyarrhythmia   . Left shoulder pain   . Back pain   . Trigger point of thoracic region 03/22/2012  . Irregular heart beat   . Shortness of breath dyspnea     with exertion  . Diabetes mellitus without complication     on meds  . Depression   . Anxiety   . Headache     stress headaches, migraines at times  . Arthritis    History   Social History  . Marital Status: Single    Spouse Name: N/A  . Number of Children: N/A  . Years of Education: N/A   Occupational History  . Not on file.   Social History Main Topics  . Smoking status: Never Smoker   . Smokeless tobacco: Never Used  . Alcohol Use: Yes     Comment: occasionally  . Drug Use: No     Comment: States no longer uses marijuana  . Sexual Activity: Yes   Other Topics Concern  . Not on file   Social History Narrative    Objective:  BP 115/79 mmHg  Pulse 87  Ht 5\' 8"  (1.727 m)  Wt 240 lb 8 oz (109.09 kg)  BMI 36.58 kg/m2 Vitals and nursing note reviewed  General: NAD Cardiac: RRR, normal heart sounds, no murmurs. Respiratory: CTAB, normal effort Abdomen:  soft, nontender, nondistended, no hepatic or splenomegaly. Bowel sounds present Extremities: no edema or cyanosis. WWP. Skin: warm and dry, no rashes noted Neuro: alert and oriented, no focal deficits  Pelvic: nl EGBUS, normal vaginal canal, normal cervix, co CMT, non tender adnexa, however uterine size and adnexal exam limited secondary to body habitus   Assessment & Plan:  See Problem List      Norma [name] A. Lincoln Brigham MD, [facility] Resident PGY-1 Pager [phone]

## 2014-10-10 NOTE — Assessment & Plan Note (Addendum)
Pap smear today 10/10/2015, will follow Last mammo 08/2014, needs repeat in 1-2 years STD testing today, RPR, HIV, GC/Ct, will follow

## 2014-10-11 LAB — HIV ANTIBODY (ROUTINE TESTING W REFLEX): HIV: NONREACTIVE

## 2014-10-11 LAB — CERVICOVAGINAL ANCILLARY ONLY
CHLAMYDIA, DNA PROBE: NEGATIVE
Neisseria Gonorrhea: NEGATIVE

## 2014-10-11 LAB — CYTOLOGY - PAP

## 2014-10-16 ENCOUNTER — Telehealth: Payer: Self-pay | Admitting: Student

## 2014-10-16 NOTE — Telephone Encounter (Signed)
Pt called and would like to know what her test results. jw

## 2014-10-16 NOTE — Telephone Encounter (Signed)
Will forward to PCP for review for test results pt had done recently. Anyra Kaufman, CMA.

## 2014-10-17 NOTE — Telephone Encounter (Signed)
Pt was called regarding her lab tests. She did not answer so a voice message was left.  [name] Brigham MD, [facility] Resident PGY-1 Pager [phone]

## 2014-10-18 NOTE — Telephone Encounter (Signed)
Pt is aware of negative results. Denijah Karrer,CMA

## 2014-10-24 ENCOUNTER — Ambulatory Visit: Payer: Medicaid Other | Admitting: Family Medicine

## 2014-11-07 ENCOUNTER — Telehealth: Payer: Self-pay | Admitting: Student

## 2014-11-07 ENCOUNTER — Encounter: Payer: Self-pay | Admitting: Student

## 2014-11-07 ENCOUNTER — Ambulatory Visit (INDEPENDENT_AMBULATORY_CARE_PROVIDER_SITE_OTHER): Payer: Medicaid Other | Admitting: Student

## 2014-11-07 VITALS — BP 111/69 | HR 74 | Temp 98.1°F | Ht 68.0 in | Wt 243.0 lb

## 2014-11-07 DIAGNOSIS — M25569 Pain in unspecified knee: Secondary | ICD-10-CM

## 2014-11-07 DIAGNOSIS — G894 Chronic pain syndrome: Secondary | ICD-10-CM | POA: Diagnosis not present

## 2014-11-07 NOTE — Patient Instructions (Signed)
Please follow up in 6 months  If you have questions or concerns please call the office

## 2014-11-07 NOTE — Telephone Encounter (Signed)
LMOVM. Please advise patient she has an orthopedic appt with Dr. Berenice Primas Tues 11/12/14 @ 3:30pm at [facility].  [place], [place] [phone]

## 2014-11-07 NOTE — Assessment & Plan Note (Signed)
Back and knee pain complaints today with no red flag symptoms - Pt will see neurosurgeon who recently operated on her back in September or earlier as needed - Referral made to ortho to re-evaluate knee pain. As she is medicaid and she was last seen by then in October of last year, she needs another referral - discussed that we cannot sign disability papers for her and that she must first see her surgeons to properly document any limitations from their stand point. When this was said she replied " then what was the point of me coming to see you". She then left without receiving further couseling

## 2014-11-07 NOTE — Progress Notes (Signed)
   Subjective:    Patient ID: Norma [name], female    DOB: 15-Dec-1960, 54 y.o.   MRN: [id number]   CC: Desire for disability paper work signed  HPI 54 y/o with PMH of b/l knee pain s/p arthroscopy in 01/2014 and chronic back pain s/p herniated disk repair in 05/2014 presents for continued back and knee pain and desires disability papers  Back pain - denies incontinence of bowel or bladder, back pain has been largely unchanged by the surgery.  - Denies shooting pains down her legs but occasional numbness - denies weakness  Knee pain - Had improved since surgery but over last 2 months worsend again and feels like her knees are " giving out" this happened twice in total, once 2 months ago and once 3 weeks ago where each time she stumbled - ambulates with a cane    Review of Systems   See HPI for ROS.  Past medical history, surgical, family, and social history reviewed and updated in the EMR as appropriate.  Past Medical History  Diagnosis Date  . Hypertension   . Acid reflux   . Tachyarrhythmia   . Left shoulder pain   . Back pain   . Trigger point of thoracic region 03/22/2012  . Irregular heart beat   . Shortness of breath dyspnea     with exertion  . Diabetes mellitus without complication     on meds  . Depression   . Anxiety   . Headache     stress headaches, migraines at times  . Arthritis     History   Social History  . Marital Status: Single    Spouse Name: N/A  . Number of Children: N/A  . Years of Education: N/A   Occupational History  . Not on file.   Social History Main Topics  . Smoking status: Never Smoker   . Smokeless tobacco: Never Used  . Alcohol Use: Yes     Comment: occasionally  . Drug Use: No     Comment: States no longer uses marijuana  . Sexual Activity: Yes   Other Topics Concern  . Not on file   Social History Narrative    Objective:  BP 111/69 mmHg  Pulse 74  Temp(Src) 98.1 F (36.7 C) (Oral)  Ht 5\' 8"  (1.727 m)  Wt  243 lb (110.224 kg)  BMI 36.96 kg/m2 Vitals and nursing note reviewed  General: NAD Cardiac: RRR, normal heart sounds, no murmurs.  Respiratory: CTAB, normal effort Abdomen: soft, nontender, nondistended,  Bowel sounds present Extremities: no edema or cyanosis. WWP. Skin: warm and dry, no rashes noted Neuro: alert and oriented, no focal deficits Musculoskeletal: tenderness over thoracic and lumbar back, over spine and para-spinous muscles. Normal ROM of b/l LE, 5/5 strength throughout   Assessment & Plan:  See Problem List      [name] Brigham MD, [facility] Resident PGY-1 Pager [phone]

## 2014-11-27 ENCOUNTER — Ambulatory Visit (INDEPENDENT_AMBULATORY_CARE_PROVIDER_SITE_OTHER): Payer: Medicaid Other | Admitting: Obstetrics and Gynecology

## 2014-11-27 ENCOUNTER — Telehealth: Payer: Self-pay | Admitting: Student

## 2014-11-27 ENCOUNTER — Encounter: Payer: Self-pay | Admitting: Obstetrics and Gynecology

## 2014-11-27 VITALS — BP 144/81 | HR 82 | Temp 98.1°F | Wt 242.0 lb

## 2014-11-27 DIAGNOSIS — M79671 Pain in right foot: Secondary | ICD-10-CM | POA: Diagnosis not present

## 2014-11-27 DIAGNOSIS — G894 Chronic pain syndrome: Secondary | ICD-10-CM | POA: Diagnosis not present

## 2014-11-27 MED ORDER — OMEPRAZOLE 20 MG PO CPDR: 20.0000 mg | DELAYED_RELEASE_CAPSULE | Freq: Every day | ORAL | Status: DC

## 2014-11-27 MED ORDER — DICLOFENAC SODIUM 1 % TD GEL: 2.0000 g | Freq: Four times a day (QID) | TRANSDERMAL | Status: DC

## 2014-11-27 MED ORDER — PREGABALIN 75 MG PO CAPS: 75.0000 mg | ORAL_CAPSULE | Freq: Two times a day (BID) | ORAL | Status: DC

## 2014-11-27 NOTE — Patient Instructions (Addendum)
-I hope you feel better today. Unfortunately your pain is chronic and will always be present best thing we can do is make sure you are still functional -All pain medication should come from one provider -Refilled some medications -Placed referral you will get a call   Chronic Pain Chronic pain can be defined as pain that is off and on and lasts for 3-6 months or longer. Many things cause chronic pain, which can make it difficult to make a diagnosis. There are many treatment options available for chronic pain. However, finding a treatment that works well for you may require trying various approaches until the right one is found. Many people benefit from a combination of two or more types of treatment to control their pain. SYMPTOMS  Chronic pain can occur anywhere in the body and can range from mild to very severe. Some types of chronic pain include:  Headache.  Low back pain.  Cancer pain.  Arthritis pain.  Neurogenic pain. This is pain resulting from damage to nerves. People with chronic pain may also have other symptoms such as:  Depression.  Anger.  Insomnia.  Anxiety. DIAGNOSIS  Your health care provider will help diagnose your condition over time. In many cases, the initial focus will be on excluding possible conditions that could be causing the pain. Depending on your symptoms, your health care provider may order tests to diagnose your condition. Some of these tests may include:   Blood tests.   CT scan.   MRI.   X-rays.   Ultrasounds.   Nerve conduction studies.  You may need to see a specialist.  TREATMENT  Finding treatment that works well may take time. You may be referred to a pain specialist. He or she may prescribe medicine or therapies, such as:   Mindful meditation or yoga.  Shots (injections) of numbing or pain-relieving medicines into the spine or area of pain.  Local electrical stimulation.  Acupuncture.   Massage therapy.   Aroma,  color, light, or sound therapy.   Biofeedback.   Working with a physical therapist to keep from getting stiff.   Regular, gentle exercise.   Cognitive or behavioral therapy.   Group support.  Sometimes, surgery may be recommended.  HOME CARE INSTRUCTIONS   Take all medicines as directed by your health care provider.   Lessen stress in your life by relaxing and doing things such as listening to calming music.   Exercise or be active as directed by your health care provider.   Eat a healthy diet and include things such as vegetables, fruits, fish, and lean meats in your diet.   Keep all follow-up appointments with your health care provider.   Attend a support group with others suffering from chronic pain. SEEK MEDICAL CARE IF:   Your pain gets worse.   You develop a new pain that was not there before.   You cannot tolerate medicines given to you by your health care provider.   You have new symptoms since your last visit with your health care provider.  SEEK IMMEDIATE MEDICAL CARE IF:   You feel weak.   You have decreased sensation or numbness.   You lose control of bowel or bladder function.   Your pain suddenly gets much worse.   You develop shaking.  You develop chills.  You develop confusion.  You develop chest pain.  You develop shortness of breath.  MAKE SURE YOU:  Understand these instructions.  Will watch your condition.  Will get  help right away if you are not doing well or get worse. Document Released: 12/26/2001 Document Revised: 12/06/2012 Document Reviewed: 09/29/2012 [facility] Patient Information 2015 [place], [place]. This information is not intended to replace advice given to you by your health care provider. Make sure you discuss any questions you have with your health care provider.

## 2014-11-27 NOTE — Telephone Encounter (Signed)
LMOVM. Please get correct info on which podiatrist patient would like to see and their telephone number.

## 2014-11-27 NOTE — Progress Notes (Signed)
    Subjective: Chief Complaint  Patient presents with  . knot on foot  . Flank Pain    HPI: Norma [name] is a 54 y.o. presenting to clinic today to discuss the following:  #Foot pain: -pain in right foot -feels like a burning pain that radiates to great toe -there are two knots located on the bottom of her foot where the pain comes from -knots been present for 6 months -no pain with just resting foot -can no longer walk on it long or keep shoe on due to compression -denies any trauma -treatments: soaks foot in brown vinegar  -h/o surgery on foot for bone spurs -she has seen a foot doctor before whom said there was nothing he could do but patient wants another referral and second opinion  #Flank Pain -right-sided flank pain that is chronic -states she always has pain in that location -feels like she is starting to sit crocked and move around more because she can't sit still from pain -had surgery on spine for ruptured disc earlier this year -pain the worse when she moves -has to use pillow to sleep -follows with a neurosurgeon, Dr. Arnoldo Morale who provides patient with pain medication (Norco) Denies fevers, chills, dysuria, discharge, abdominal pain, blood in urine  ROS in HPI.  Past Medical, Surgical, Social, and Family History Reviewed & Updated per EMR.   Objective: BP 144/81 mmHg  Pulse 82  Temp(Src) 98.1 F (36.7 C) (Oral)  Wt 242 lb (109.77 kg)  Physical Exam  Constitutional: She is well-developed, well-nourished, and in no distress.  Cardiovascular: Normal rate, regular rhythm and normal heart sounds.   Pulmonary/Chest: Effort normal and breath sounds normal.  Abdominal: Soft. Bowel sounds are normal. There is no tenderness.  Musculoskeletal: Normal range of motion. She exhibits no edema or tenderness.  Neg Llyod's sign.  Skin: Skin is warm and dry.  R. Foot Check -  Appearance - two nickle size callus vs cyst on medial plantar aspect of midfoot under great  toe. no ulcers, skin intact.  Skin - no unusual pallor or redness Non-tender with palpation   Assessment/Plan: Please see problem based Assessment and Plan  Health Maintainance: Needs follow-up with PCP   Orders Placed This Encounter  Procedures  . Ambulatory referral to Podiatry    Referral Priority:  Routine    Referral Type:  Consultation    Referral Reason:  Specialty Services Required    Requested Specialty:  Podiatry    Number of Visits Requested:  1    Meds ordered this encounter  Medications  . pregabalin (LYRICA) 75 MG capsule    Sig: Take 1 capsule (75 mg total) by mouth 2 (two) times daily.    Dispense:  60 capsule    Refill:  1  . diclofenac sodium (VOLTAREN) 1 % GEL    Sig: Apply 2 g topically 4 (four) times daily.    Dispense:  100 g    Refill:  3  . omeprazole (PRILOSEC) 20 MG capsule    Sig: Take 1 capsule (20 mg total) by mouth daily.    Dispense:  30 capsule    Refill:  [place], DO PGY-2, [place]

## 2014-11-30 NOTE — Assessment & Plan Note (Signed)
Continues to have pain complaints about her back and legs. Has been seen before by pain management clinic. Currently getting pain meds from neurosurgeon. Referral placed at last visit to go back to ortho. No red flags on exam. -discussed with patient that I would not be giving her any refills on narcotic pain meds  -refilled Lyrica and voltaren gel to use for pain -continue to monitor and supprot

## 2014-11-30 NOTE — Assessment & Plan Note (Signed)
Continues to endorse burning pain in plantar surface of midfoot from callus vs cyst. Insoles not helping. Starting to affect mobility and wuality of life -referral placed to get second opinion from podiatry -due to some component of neuropathic pain lyrica was refilled

## 2014-12-04 NOTE — Telephone Encounter (Signed)
[facility]; [place]; [phone]; Dr. Abagail Kitchens

## 2015-01-02 ENCOUNTER — Telehealth: Payer: Self-pay | Admitting: Student

## 2015-01-02 DIAGNOSIS — M5440 Lumbago with sciatica, unspecified side: Secondary | ICD-10-CM

## 2015-01-02 NOTE — Telephone Encounter (Signed)
Needs referral to a neurologist to have her spine checked out Dr Babs Bertin Neurosurgery Please let pt know when this is done

## 2015-01-02 NOTE — Telephone Encounter (Signed)
Will forward to MD to place order for referral. [facility]

## 2015-01-03 NOTE — Telephone Encounter (Signed)
Pt is aware of this. Jazmin Hartsell,CMA  

## 2015-01-03 NOTE — Telephone Encounter (Signed)
Neurosurgery referral sent. Please inform pt   Norma [name] Brigham MD, [place] [facility] Resident PGY-2 Pager [phone]

## 2015-01-07 ENCOUNTER — Other Ambulatory Visit: Payer: Self-pay | Admitting: Family Medicine

## 2015-01-07 NOTE — Telephone Encounter (Signed)
Will not refill patient's ibuprofen at this time as she is on another NSAID in addition to other pain medications. She should schedule an office visit if she is having new pain.   Norma [name]. Jerline Pain, [place] Resident PGY-2 01/07/2015 8:12 PM

## 2015-01-08 NOTE — Telephone Encounter (Signed)
Mailed letter to pt today to schedule appt for any new pain issues. Adams,Latoya, CMA.

## 2015-02-13 ENCOUNTER — Ambulatory Visit: Payer: Medicaid Other

## 2015-02-28 IMAGING — CR DG ANKLE 2V *R*
2 series · 2 of 2 positions shown · non-contrast
Comparison: 10/13/2011

CLINICAL DATA: Increasing ankle pain over the past 4 weeks

RIGHT ANKLE - 2 VIEW

[t ankle joint ap right]
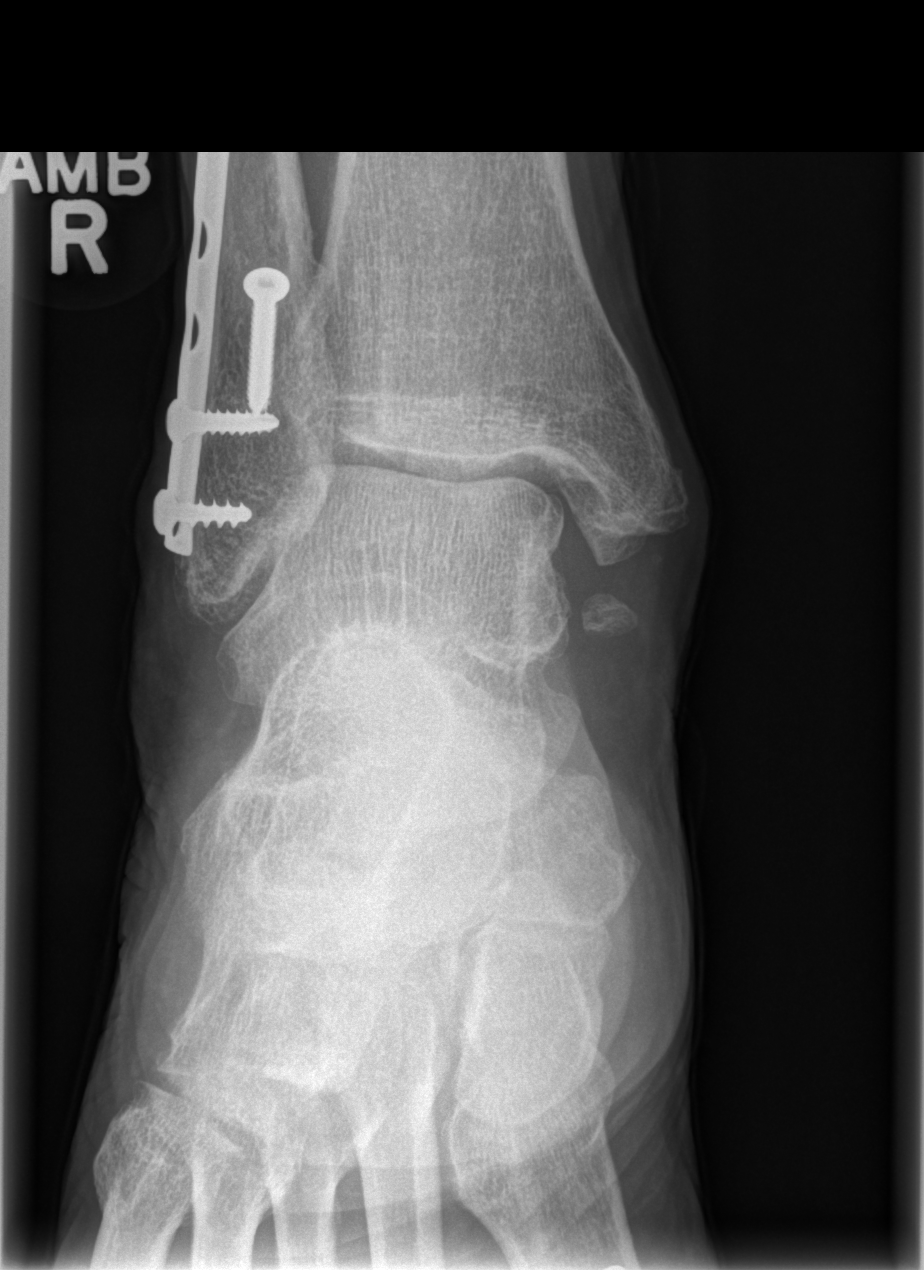

[t ankle joint lat right]
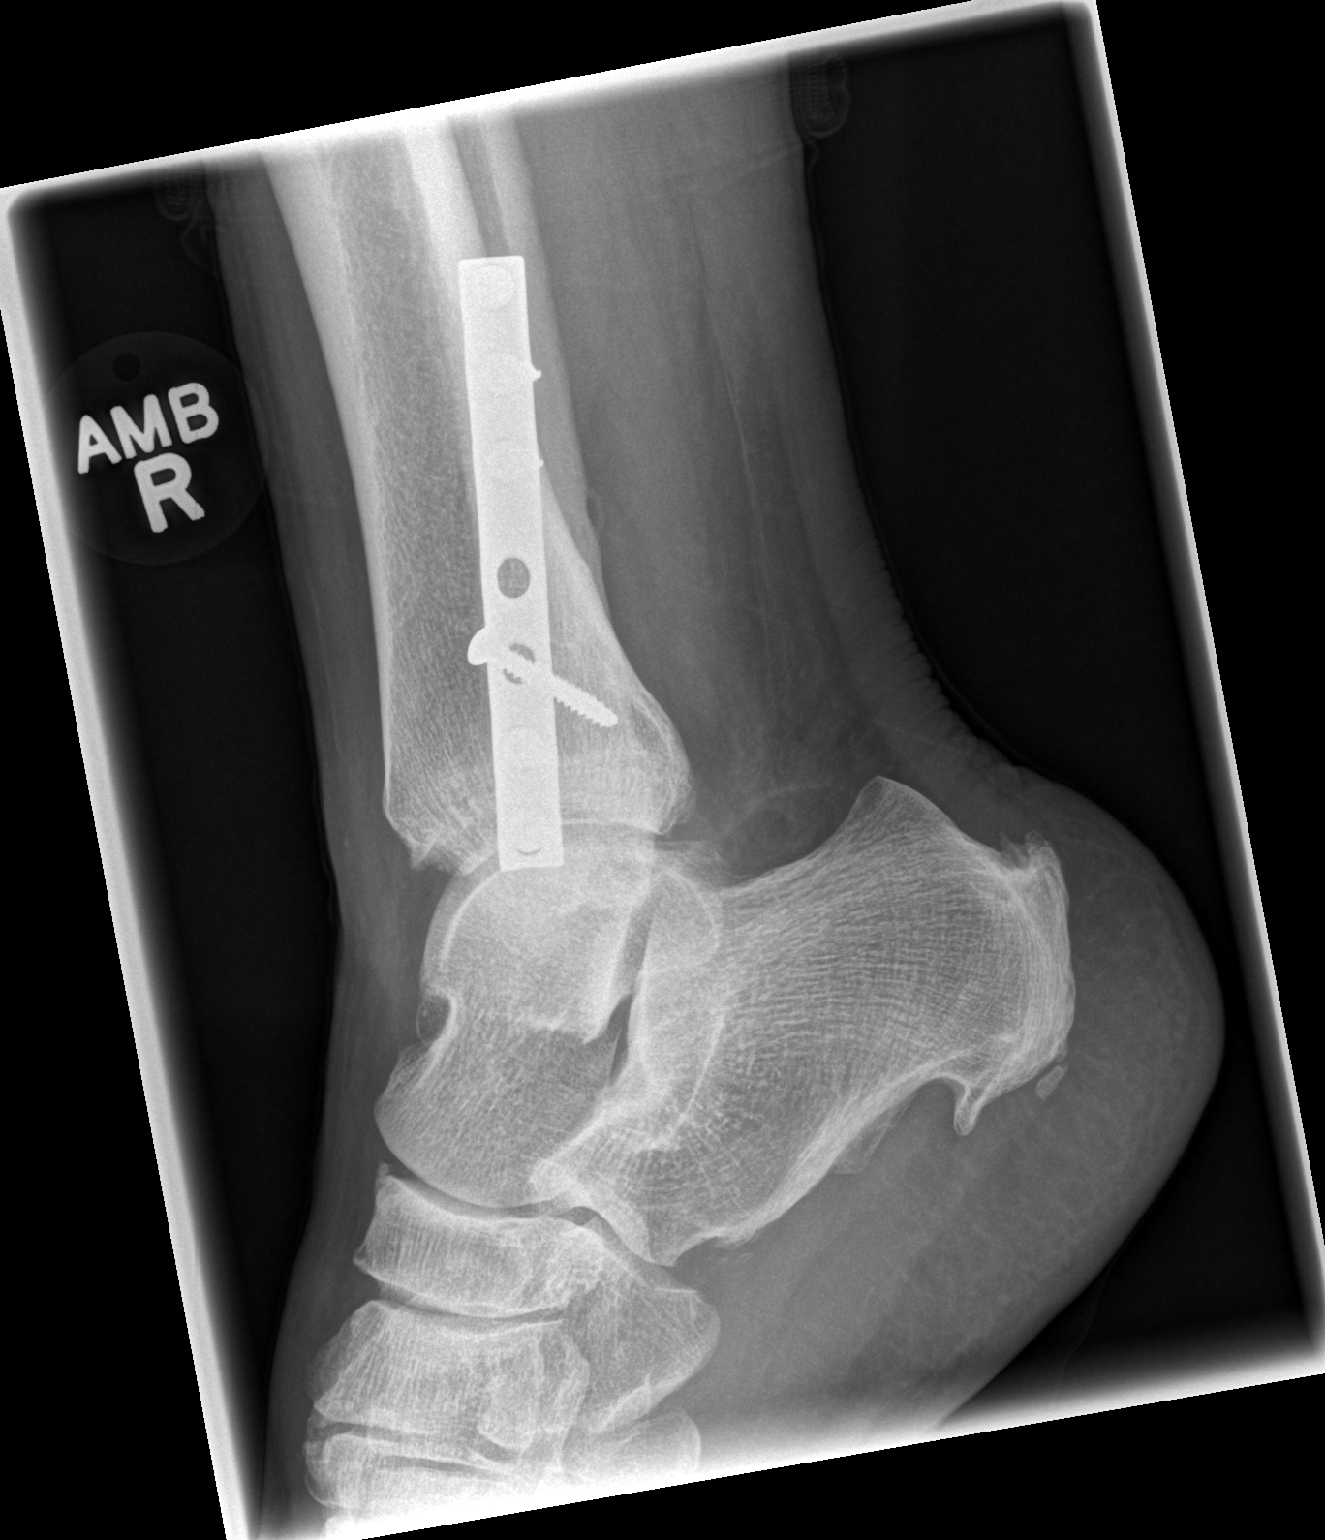

[2 of 2 positions shown; findings below may reference images not displayed]

FINDINGS: Postsurgical changes are again noted in the distal right
fibula.  A fixation side plate and multiple fixation screws are
noted.  No device hardware failure is noted.  The overall
appearance is stable.  Degenerative changes are noted in the tarsal
bones as well as the calcaneal spurs.  A small bone fragment
related to prior trauma is noted medially and stable.
IMPRESSION: No significant interval change from prior exam.

## 2015-03-06 ENCOUNTER — Ambulatory Visit (INDEPENDENT_AMBULATORY_CARE_PROVIDER_SITE_OTHER): Payer: Medicaid Other | Admitting: *Deleted

## 2015-03-06 DIAGNOSIS — Z23 Encounter for immunization: Secondary | ICD-10-CM

## 2015-04-09 ENCOUNTER — Ambulatory Visit (INDEPENDENT_AMBULATORY_CARE_PROVIDER_SITE_OTHER): Payer: Medicaid Other | Admitting: Family Medicine

## 2015-04-09 ENCOUNTER — Telehealth: Payer: Self-pay | Admitting: Student

## 2015-04-09 ENCOUNTER — Encounter: Payer: Self-pay | Admitting: Family Medicine

## 2015-04-09 VITALS — BP 162/76 | HR 69 | Temp 97.8°F | Ht 68.0 in | Wt 245.0 lb

## 2015-04-09 DIAGNOSIS — R208 Other disturbances of skin sensation: Secondary | ICD-10-CM

## 2015-04-09 MED ORDER — PREGABALIN 75 MG PO CAPS: 75.0000 mg | ORAL_CAPSULE | Freq: Two times a day (BID) | ORAL | Status: DC

## 2015-04-09 NOTE — Progress Notes (Signed)
Patient ID: Norma [name], female   DOB: 24-Aug-1960, 54 y.o.   MRN: [id number]   [facility] Aquilla Hacker, MD Phone: [phone]  Subjective:   # Burning of Feet - Pt. Here because she has been out of her lyrica for 3 weeks, and has noticed a burning sensation in her feet for the past three weeks.  - She says that she has had this for two years, that it is chronic and stable.  - No new changes - No numbness of her feet, no tingling, no weakness of her lower extremities.  - she has had surgery on her right foot.  - She has not had any back pain.  - no history of diabetes. Last A1C is 5.7.  - She has been seeing a podiatrist for management of her toenails.  - no coldness in her feet or hands.   # Handicap Placcard  - pt. Requesting filling out of handicap placcard form.  - She says she has pain in her lower extremities is the reason for wanting a disability tag.   All relevant systems were reviewed and were negative unless otherwise noted in the HPI  Past Medical History Reviewed problem list.  Medications- reviewed and updated Current Outpatient Prescriptions  Medication Sig Dispense Refill  . ARIPiprazole (ABILIFY) 2 MG tablet Take 2 mg by mouth daily.    . beta carotene w/minerals (OCUVITE) tablet Take 1 tablet by mouth daily.    . Blood Glucose Monitoring Suppl (BLOOD GLUCOSE METER KIT AND SUPPLIES) Dispense based on patient and insurance preference. Use up to four times daily as directed. (FOR ICD-9 250.00, 250.01). (Patient not taking: Reported on 10/10/2014) 1 each 0  . buPROPion (WELLBUTRIN XL) 300 MG 24 hr tablet Take 300 mg by mouth daily.    . clobetasol ointment (TEMOVATE) 3.81 % Apply 1 application topically 2 (two) times daily. 30 g 0  . cyclobenzaprine (FLEXERIL) 10 MG tablet take 1 tablet by mouth three times a day if needed for muscle spasm (Patient not taking: Reported on 10/10/2014) 90 tablet 0  . cyclobenzaprine (FLEXERIL) 10 MG tablet  Take 1 tablet (10 mg total) by mouth 3 (three) times daily as needed for muscle spasms. (Patient not taking: Reported on 09/17/2014) 30 tablet 1  . diazepam (VALIUM) 5 MG tablet Take 5 mg by mouth every 6 (six) hours as needed for anxiety.    . diazepam (VALIUM) 5 MG tablet Take 1 tablet (5 mg total) by mouth every 6 (six) hours as needed for muscle spasms. (Patient not taking: Reported on 10/10/2014) 50 tablet 1  . diclofenac sodium (VOLTAREN) 1 % GEL Apply 2 g topically 4 (four) times daily. 100 g 3  . diltiazem (CARDIZEM SR) 60 MG 12 hr capsule Take 1 capsule (60 mg total) by mouth 2 (two) times daily. 180 capsule 1  . EPINEPHrine (EPIPEN) 0.3 mg/0.3 mL SOAJ Inject 0.3 mLs (0.3 mg total) into the muscle once. 1 Device 1  . fish oil-omega-3 fatty acids 1000 MG capsule Take 2 g by mouth daily.    . hydrochlorothiazide (HYDRODIURIL) 25 MG tablet Take 1 tablet (25 mg total) by mouth daily. 90 tablet 3  . HYDROcodone-acetaminophen (NORCO/VICODIN) 5-325 MG per tablet   0  . hydrOXYzine (ATARAX/VISTARIL) 10 MG tablet Take 10 mg by mouth 3 (three) times daily as needed for itching.    . hydrOXYzine (VISTARIL) 25 MG capsule take 1 capsule by mouth every 6 hours if needed for  itching (Patient not taking: Reported on 11/27/2014) 30 capsule 0  . ibuprofen (ADVIL,MOTRIN) 800 MG tablet Take 1 tablet (800 mg total) by mouth every 8 (eight) hours as needed. (Patient not taking: Reported on 10/10/2014) 30 tablet 0  . isosorbide mononitrate (IMDUR) 30 MG 24 hr tablet Take 30 mg by mouth daily.    . metFORMIN (GLUCOPHAGE) 500 MG tablet Take 1 tablet (500 mg total) by mouth 2 (two) times daily with a meal. (Patient not taking: Reported on 09/17/2014) 180 tablet 3  . metoprolol tartrate (LOPRESSOR) 25 MG tablet Take 1 tablet (25 mg total) by mouth 2 (two) times daily. 180 tablet 3  . milk thistle 175 MG tablet Take 350 mg by mouth daily.    . Multiple Vitamin (MULTIVITAMIN WITH MINERALS) TABS tablet Take 1 tablet by mouth  daily.    . naproxen (NAPROSYN) 500 MG tablet Take 500 mg by mouth 2 (two) times daily with a meal.  0  . omeprazole (PRILOSEC) 20 MG capsule Take 1 capsule (20 mg total) by mouth daily. 30 capsule 6  . oxyCODONE-acetaminophen (PERCOCET) 10-325 MG per tablet Take 1 tablet by mouth every 4 (four) hours as needed for pain. (Patient not taking: Reported on 10/10/2014) 100 tablet 0  . oxyCODONE-acetaminophen (PERCOCET/ROXICET) 5-325 MG per tablet   0  . pregabalin (LYRICA) 75 MG capsule Take 1 capsule (75 mg total) by mouth 2 (two) times daily. 60 capsule 0  . SUMAtriptan (IMITREX) 50 MG tablet Take 1 tablet (50 mg total) by mouth every 2 (two) hours as needed for migraine or headache. Do not exceed 200 mg daily. 10 tablet 0  . traMADol (ULTRAM) 50 MG tablet Take 1-2 tablets (50-100 mg total) by mouth every 6 (six) hours as needed. (Patient not taking: Reported on 10/10/2014) 60 tablet 0  . traZODone (DESYREL) 100 MG tablet Take 2 tablets (200 mg total) by mouth at bedtime. 180 tablet 3  . triamcinolone ointment (KENALOG) 0.5 % Apply 1 application topically 2 (two) times daily. X 1 week. 30 g 0   No current facility-administered medications for this visit.   Chief complaint-noted No additions to family history Social history- patient is a non smoker  Objective: BP 162/76 mmHg  Pulse 69  Temp(Src) 97.8 F (36.6 C) (Oral)  Ht _0  (1.727 m)  Wt 245 lb (111.131 kg)  BMI 37.26 kg/m2 Gen: NAD, alert, cooperative with exam HEENT: NCAT, EOMI, PERRL Neck: FROM, supple CV: RRR, good S1/S2, no murmur Resp: CTABL, no wheezes, non-labored  Abd: SNTND, BS present, no guarding or organomegaly Ext: No edema, warm, normal tone, moves UE/LE spontaneously - 5/5 motor in BL LE - Nontender to palpation of BL LE - No anatomical changes.  Neuro: Alert and oriented, No gross deficits, snesation in touch to light touch, pressure, and proprioception in BL LE. She does not have any motor deficits of her LE.  Normal Achiles reflexes.  Skin: no rashes no lesions, no skin changes of BL LE  Assessment/Plan:  Foot Burning Sensation : Not true neuropathic pain, long hx of chronic pain syndrome and referred to pain clinic in the past for management. No evidence of neurological compromise at this time. No historical findings or red flag findings. Lyrica has helped in the past, and her pain correlates with the period of being off of the lyrica.  - We will restart her lyrica today. Refilled for one month.  - Recommend following up with PCP.  - Could consider alternative trial  of Elavil, or Cymbalta to see if this might treat concomitant mental health disorders as well.  - Discussed with Dr. Valentina Lucks, and there is no known withdrawal syndrome from Lyrica that could cause these symptoms.  - He recommended the above as well.  - f/u with PCP for ongoing management.  - Return prn.    # Handicap Placcard - pt. Instructed to place request in PCP box.

## 2015-04-09 NOTE — Telephone Encounter (Signed)
Patient dropped off handicapped placard form.  Please call her when completed.

## 2015-04-09 NOTE — Patient Instructions (Signed)
Thanks for coming in today.   We will refill your lyrica for one month.   You should follow up with your PCP.   Thanks for letting us take care of you.   Sincerely,  Paula Compton, MD Family Medicine - PGY 2

## 2015-04-10 NOTE — Telephone Encounter (Signed)
Clinic portion completed and placed in provider's box for signature. Jazmin Hartsell,CMA

## 2015-04-13 ENCOUNTER — Encounter (HOSPITAL_COMMUNITY): Payer: Self-pay | Admitting: Vascular Surgery

## 2015-04-13 ENCOUNTER — Emergency Department (HOSPITAL_COMMUNITY): Payer: Medicaid Other

## 2015-04-13 ENCOUNTER — Emergency Department (HOSPITAL_COMMUNITY)
Admission: EM | Admit: 2015-04-13 | Discharge: 2015-04-14 | Disposition: A | Discharge status: Home / Self Care | Payer: Medicaid Other | Attending: Emergency Medicine | Admitting: Emergency Medicine

## 2015-04-13 DIAGNOSIS — F329 Major depressive disorder, single episode, unspecified: Secondary | ICD-10-CM | POA: Insufficient documentation

## 2015-04-13 DIAGNOSIS — Z79899 Other long term (current) drug therapy: Secondary | ICD-10-CM | POA: Insufficient documentation

## 2015-04-13 DIAGNOSIS — K219 Gastro-esophageal reflux disease without esophagitis: Secondary | ICD-10-CM | POA: Insufficient documentation

## 2015-04-13 DIAGNOSIS — Y998 Other external cause status: Secondary | ICD-10-CM | POA: Diagnosis not present

## 2015-04-13 DIAGNOSIS — Y9389 Activity, other specified: Secondary | ICD-10-CM | POA: Diagnosis not present

## 2015-04-13 DIAGNOSIS — F419 Anxiety disorder, unspecified: Secondary | ICD-10-CM | POA: Insufficient documentation

## 2015-04-13 DIAGNOSIS — M1990 Unspecified osteoarthritis, unspecified site: Secondary | ICD-10-CM | POA: Diagnosis not present

## 2015-04-13 DIAGNOSIS — G43909 Migraine, unspecified, not intractable, without status migrainosus: Secondary | ICD-10-CM | POA: Insufficient documentation

## 2015-04-13 DIAGNOSIS — Y9289 Other specified places as the place of occurrence of the external cause: Secondary | ICD-10-CM | POA: Insufficient documentation

## 2015-04-13 DIAGNOSIS — Z23 Encounter for immunization: Secondary | ICD-10-CM | POA: Insufficient documentation

## 2015-04-13 DIAGNOSIS — S51012A Laceration without foreign body of left elbow, initial encounter: Secondary | ICD-10-CM | POA: Diagnosis not present

## 2015-04-13 DIAGNOSIS — I499 Cardiac arrhythmia, unspecified: Secondary | ICD-10-CM | POA: Diagnosis not present

## 2015-04-13 DIAGNOSIS — E119 Type 2 diabetes mellitus without complications: Secondary | ICD-10-CM | POA: Insufficient documentation

## 2015-04-13 DIAGNOSIS — Z791 Long term (current) use of non-steroidal anti-inflammatories (NSAID): Secondary | ICD-10-CM | POA: Diagnosis not present

## 2015-04-13 DIAGNOSIS — S41111A Laceration without foreign body of right upper arm, initial encounter: Secondary | ICD-10-CM | POA: Diagnosis not present

## 2015-04-13 DIAGNOSIS — I10 Essential (primary) hypertension: Secondary | ICD-10-CM | POA: Diagnosis not present

## 2015-04-13 DIAGNOSIS — W268XXA Contact with other sharp object(s), not elsewhere classified, initial encounter: Secondary | ICD-10-CM | POA: Insufficient documentation

## 2015-04-13 DIAGNOSIS — S6991XA Unspecified injury of right wrist, hand and finger(s), initial encounter: Secondary | ICD-10-CM | POA: Diagnosis present

## 2015-04-13 MED ORDER — OXYCODONE-ACETAMINOPHEN 5-325 MG PO TABS: 1.0000 | ORAL_TABLET | Freq: Once | ORAL | Status: DC

## 2015-04-13 MED ORDER — LIDOCAINE-EPINEPHRINE 1 %-1:100000 IJ SOLN
10.0000 mL | Freq: Once | INTRAMUSCULAR | Status: AC
  Administered 2015-04-14: 10 mL
  Filled 2015-04-13: qty 1

## 2015-04-13 MED ORDER — TETANUS-DIPHTH-ACELL PERTUSSIS 5-2.5-18.5 LF-MCG/0.5 IM SUSP
0.5000 mL | Freq: Once | INTRAMUSCULAR | Status: AC
  Administered 2015-04-13: 0.5 mL via INTRAMUSCULAR
  Filled 2015-04-13: qty 0.5
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
## 2015-04-13 NOTE — ED Notes (Signed)
Patient transported to X-ray 
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
## 2015-04-14 NOTE — ED Notes (Addendum)
PA Kirichenko at bedside attempting to prep patient for suturing; Patient asked PA "how old are you? Do you know what you are doing"; PA Kirichenko responded with "I dont care what you think of me"; Patient responded with "Well I dont want you working on me if you dont care"; RN at bedside attempted to deescalate the situation by saying "She (PA) does not take patients comments personally"; conversation between PA and patient then escalated to where patient refused to have PA on her care team and requested supervisor; MD Horton and Everlena Cooper RN notified of situation and requested to speak with patient

## 2015-04-14 NOTE — ED Provider Notes (Signed)
..  Laceration Repair Date/Time: 04/14/2015 3:10 PM Performed by: Margarita Mail Authorized by: Margarita Mail Consent: Verbal consent obtained. Risks and benefits: risks, benefits and alternatives were discussed Consent given by: patient Patient identity confirmed: verbally with patient Time out: Immediately prior to procedure a "time out" was called to verify the correct patient, procedure, equipment, support staff and site/side marked as required. Body area: upper extremity Location details: left elbow Laceration length: 8 cm Foreign bodies: no foreign bodies Tendon involvement: none Nerve involvement: none Vascular damage: no Anesthesia: local infiltration Local anesthetic: lidocaine 2% with epinephrine Anesthetic total: 10 ml Patient sedated: no Preparation: Patient was prepped and draped in the usual sterile fashion. Irrigation solution: saline Irrigation method: jet lavage Amount of cleaning: standard Debridement: none Degree of undermining: none Skin closure: 4-0 Prolene Fascia closure: 5-0 Vicryl Number of sutures: 17 Technique: running (sub q and RI) Approximation: close Approximation difficulty: complex Dressing: gauze packing Patient tolerance: Patient tolerated the procedure well with no immediate complications  .Marland KitchenLaceration Repair Date/Time: 04/14/2015 3:13 PM Performed by: Margarita Mail Authorized by: Margarita Mail Consent: Verbal consent obtained. Risks and benefits: risks, benefits and alternatives were discussed Consent given by: patient Patient identity confirmed: verbally with patient Time out: Immediately prior to procedure a "time out" was called to verify the correct patient, procedure, equipment, support staff and site/side marked as required. Body area: upper extremity Location details: left lower arm Laceration length: 1 cm Foreign bodies: no foreign bodies Tendon involvement: none Nerve involvement: none Vascular damage: no Anesthesia:  local infiltration Local anesthetic: lidocaine 2% with epinephrine Anesthetic total: 1 ml Patient sedated: no Preparation: Patient was prepped and draped in the usual sterile fashion. Irrigation solution: saline Irrigation method: syringe Amount of cleaning: standard Debridement: none Degree of undermining: none Skin closure: 4-0 Prolene Number of sutures: 2 Technique: horizontal mattress and simple Approximation: close Dressing: 4x4 sterile gauze Patient tolerance: Patient tolerated the procedure well with no immediate complications  .Marland KitchenLaceration Repair Date/Time: 04/14/2015 3:14 PM Performed by: Margarita Mail Authorized by: Margarita Mail Consent: Verbal consent obtained. Risks and benefits: risks, benefits and alternatives were discussed Consent given by: patient Patient identity confirmed: verbally with patient Time out: Immediately prior to procedure a "time out" was called to verify the correct patient, procedure, equipment, support staff and site/side marked as required. Body area: upper extremity Location details: right upper arm Laceration length: 1 cm Foreign bodies: no foreign bodies Tendon involvement: none Nerve involvement: none Vascular damage: no Anesthesia: local infiltration Local anesthetic: lidocaine 2% with epinephrine Anesthetic total: 1 ml Preparation: Patient was prepped and draped in the usual sterile fashion. Irrigation solution: saline Irrigation method: syringe Amount of cleaning: standard Debridement: none Degree of undermining: none Skin closure: 4-0 Prolene Number of sutures: 1 Technique: simple Approximation: close Dressing: 4x4 sterile gauze Patient tolerance: Patient tolerated the procedure well with no immediate complications     Margarita Mail, PA-C 04/14/15 [place], MD 04/16/15 1445

## 2015-04-14 NOTE — Discharge Instructions (Signed)
Ibuprofen or Tylenol for pain. Keep the lacerations clean, dry. Apply triple antibiotic ointment twice a day. Follow-up in 10 days for suture removal.   Laceration Care, Adult A laceration is a cut that goes through all of the layers of the skin and into the tissue that is right under the skin. Some lacerations heal on their own. Others need to be closed with stitches (sutures), staples, skin adhesive strips, or skin glue. Proper laceration care minimizes the risk of infection and helps the laceration to heal better. HOW TO CARE FOR YOUR LACERATION If sutures or staples were used:  Keep the wound clean and dry.  If you were given a bandage (dressing), you should change it at least one time per day or as told by your health care provider. You should also change it if it becomes wet or dirty.  Keep the wound completely dry for the first 24 hours or as told by your health care provider. After that time, you may shower or bathe. However, make sure that the wound is not soaked in water until after the sutures or staples have been removed.  Clean the wound one time each day or as told by your health care provider:  Wash the wound with soap and water.  Rinse the wound with water to remove all soap.  Pat the wound dry with a clean towel. Do not rub the wound.  After cleaning the wound, apply a thin layer of antibiotic ointmentas told by your health care provider. This will help to prevent infection and keep the dressing from sticking to the wound.  Have the sutures or staples removed as told by your health care provider. If skin adhesive strips were used:  Keep the wound clean and dry.  If you were given a bandage (dressing), you should change it at least one time per day or as told by your health care provider. You should also change it if it becomes dirty or wet.  Do not get the skin adhesive strips wet. You may shower or bathe, but be careful to keep the wound dry.  If the wound gets  wet, pat it dry with a clean towel. Do not rub the wound.  Skin adhesive strips fall off on their own. You may trim the strips as the wound heals. Do not remove skin adhesive strips that are still stuck to the wound. They will fall off in time. If skin glue was used:  Try to keep the wound dry, but you may briefly wet it in the shower or bath. Do not soak the wound in water, such as by swimming.  After you have showered or bathed, gently pat the wound dry with a clean towel. Do not rub the wound.  Do not do any activities that will make you sweat heavily until the skin glue has fallen off on its own.  Do not apply liquid, cream, or ointment medicine to the wound while the skin glue is in place. Using those may loosen the film before the wound has healed.  If you were given a bandage (dressing), you should change it at least one time per day or as told by your health care provider. You should also change it if it becomes dirty or wet.  If a dressing is placed over the wound, be careful not to apply tape directly over the skin glue. Doing that may cause the glue to be pulled off before the wound has healed.  Do not pick at  the glue. The skin glue usually remains in place for 5-10 days, then it falls off of the skin. General Instructions  Take over-the-counter and prescription medicines only as told by your health care provider.  If you were prescribed an antibiotic medicine or ointment, take or apply it as told by your doctor. Do not stop using it even if your condition improves.  To help prevent scarring, make sure to cover your wound with sunscreen whenever you are outside after stitches are removed, after adhesive strips are removed, or when glue remains in place and the wound is healed. Make sure to wear a sunscreen of at least 30 SPF.  Do not scratch or pick at the wound.  Keep all follow-up visits as told by your health care provider. This is important.  Check your wound every day  for signs of infection. Watch for:  Redness, swelling, or pain.  Fluid, blood, or pus.  Raise (elevate) the injured area above the level of your heart while you are sitting or lying down, if possible. SEEK MEDICAL CARE IF:  You received a tetanus shot and you have swelling, severe pain, redness, or bleeding at the injection site.  You have a fever.  A wound that was closed breaks open.  You notice a bad smell coming from your wound or your dressing.  You notice something coming out of the wound, such as wood or glass.  Your pain is not controlled with medicine.  You have increased redness, swelling, or pain at the site of your wound.  You have fluid, blood, or pus coming from your wound.  You notice a change in the color of your skin near your wound.  You need to change the dressing frequently due to fluid, blood, or pus draining from the wound.  You develop a new rash.  You develop numbness around the wound. SEEK IMMEDIATE MEDICAL CARE IF:  You develop severe swelling around the wound.  Your pain suddenly increases and is severe.  You develop painful lumps near the wound or on skin that is anywhere on your body.  You have a red streak going away from your wound.  The wound is on your hand or foot and you cannot properly move a finger or toe.  The wound is on your hand or foot and you notice that your fingers or toes look pale or bluish.   This information is not intended to replace advice given to you by your health care provider. Make sure you discuss any questions you have with your health care provider.   Document Released: 04/05/2005 Document Revised: 08/20/2014 Document Reviewed: 04/01/2014 Elsevier Interactive Patient Education [employer].

## 2015-04-17 NOTE — Telephone Encounter (Signed)
Handicapped placard  form placed at front desk

## 2015-04-18 ENCOUNTER — Ambulatory Visit
Admission: RE | Admit: 2015-04-18 | Discharge: 2015-04-18 | Disposition: A | Discharge status: Home / Self Care | Payer: Medicaid Other | Source: Ambulatory Visit | Attending: Cardiovascular Disease | Admitting: Cardiovascular Disease

## 2015-04-18 ENCOUNTER — Other Ambulatory Visit: Payer: Self-pay | Admitting: Cardiovascular Disease

## 2015-04-18 DIAGNOSIS — R05 Cough: Secondary | ICD-10-CM

## 2015-04-18 DIAGNOSIS — R059 Cough, unspecified: Secondary | ICD-10-CM

## 2015-04-18 DIAGNOSIS — R062 Wheezing: Secondary | ICD-10-CM

## 2015-04-22 ENCOUNTER — Ambulatory Visit (INDEPENDENT_AMBULATORY_CARE_PROVIDER_SITE_OTHER): Payer: Medicaid Other | Admitting: Student

## 2015-04-22 ENCOUNTER — Encounter: Payer: Self-pay | Admitting: Student

## 2015-04-22 VITALS — BP 152/88 | HR 73 | Temp 98.2°F | Resp 16 | Wt 240.6 lb

## 2015-04-22 DIAGNOSIS — R7303 Prediabetes: Secondary | ICD-10-CM

## 2015-04-22 DIAGNOSIS — G8929 Other chronic pain: Secondary | ICD-10-CM

## 2015-04-22 DIAGNOSIS — R208 Other disturbances of skin sensation: Secondary | ICD-10-CM

## 2015-04-22 DIAGNOSIS — S41112A Laceration without foreign body of left upper arm, initial encounter: Secondary | ICD-10-CM | POA: Insufficient documentation

## 2015-04-22 DIAGNOSIS — L918 Other hypertrophic disorders of the skin: Secondary | ICD-10-CM | POA: Diagnosis not present

## 2015-04-22 DIAGNOSIS — G894 Chronic pain syndrome: Secondary | ICD-10-CM

## 2015-04-22 DIAGNOSIS — S41112D Laceration without foreign body of left upper arm, subsequent encounter: Secondary | ICD-10-CM

## 2015-04-22 LAB — POCT GLYCOSYLATED HEMOGLOBIN (HGB A1C): HEMOGLOBIN A1C: 5.4

## 2015-04-22 MED ORDER — PREGABALIN 100 MG PO CAPS: 100.0000 mg | ORAL_CAPSULE | Freq: Two times a day (BID) | ORAL | Status: DC

## 2015-04-22 MED ORDER — GABAPENTIN 100 MG PO CAPS: 100.0000 mg | ORAL_CAPSULE | Freq: Three times a day (TID) | ORAL | Status: DC
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
## 2015-04-22 NOTE — Assessment & Plan Note (Signed)
Low suspition for malignancy given normal coloration, small size and history of recurrent skin tags.  Pt would like to have her skin tags removed. - Pt will make return appt for removal in the coming weeks

## 2015-04-22 NOTE — Patient Instructions (Signed)
Follow up in 1 week for skin tab removal If you have questions or concerns call the office at [phone]

## 2015-04-22 NOTE — Assessment & Plan Note (Signed)
Laceration suture repair with some dry skin surrounding - To have sutures removed on 04/27/2014 - pt counseled to use moisturizing lotion for dry surrounding skin - gauze wrap given to protect against contact irritation

## 2015-04-22 NOTE — Assessment & Plan Note (Signed)
A1c 5.2 today indicating she does have prediabetes any more. She is no longer taking metformin and is trying to exercise more and eat healthfully - will continue to encourage lifestyle modification -

## 2015-04-22 NOTE — Assessment & Plan Note (Addendum)
Chronic back pain now with need for provider to given pain medications as the neurosurgeon who was following her has released her. We discussed this practice taking over her pain medication regimen contingent on her signing a pain contract, submitting a UDS and with the understanding that we would work very hard to wean her from narcotics. The other option was to refer to a pain clinic as she was seen by one in past. She opted for pain clinic referral - Amb referral to pain management clinic made - Lyrica increased to 100 BID to help with chronic neuropathy

## 2015-04-29 ENCOUNTER — Ambulatory Visit (INDEPENDENT_AMBULATORY_CARE_PROVIDER_SITE_OTHER): Payer: Medicaid Other | Admitting: *Deleted

## 2015-04-29 DIAGNOSIS — Z4802 Encounter for removal of sutures: Secondary | ICD-10-CM

## 2015-05-01 ENCOUNTER — Encounter: Payer: Self-pay | Admitting: Student

## 2015-05-01 ENCOUNTER — Ambulatory Visit (INDEPENDENT_AMBULATORY_CARE_PROVIDER_SITE_OTHER): Payer: Medicaid Other | Admitting: Student

## 2015-05-01 VITALS — BP 130/82 | HR 93 | Temp 98.3°F | Ht 68.0 in | Wt 237.0 lb

## 2015-05-01 DIAGNOSIS — L918 Other hypertrophic disorders of the skin: Secondary | ICD-10-CM | POA: Diagnosis not present

## 2015-05-01 NOTE — Patient Instructions (Addendum)
Follow up with PCP as needed You are due for a colonoscopy, please schedule one as soon as you can If you have questions or concerns, call the office at [phone]

## 2015-05-02 NOTE — Progress Notes (Signed)
   Subjective:    Patient ID: Norma [name], female    DOB: [birth year], 55 y.o.   MRN: [id number]   CC: skin tag removal  HPI: 55 y/o presenting for skin tag removal  Skin tags - Noted some small ones on her neck, two on her face and two on her torso below her left breast. - the skin tags on her neck are very small and not noticeable unless she touches them. She does not wish to remove these today - There are two on her face, one on her left temple and one on her right cheek which she would like removed - the skin tags on her torso are mildly larger but she does not wish to remove these today either - she denies rapid growth, changes in color, irritation, itching or pain over these sites   Review of Systems ROS Per HPI else she denies headache, changes in vision, chest pain, SOB, N/V/D, weakness, recent illness or fever  Past Medical, Surgical, Social, and Family History Reviewed & Updated per EMR.   Objective:  BP 130/82 mmHg  Pulse 93  Temp(Src) 98.3 F (36.8 C) (Oral)  Ht 5\' 8"  (1.727 m)  Wt 237 lb (107.502 kg)  BMI 36.04 kg/m2  SpO2 95% Vitals and nursing note reviewed  General: NAD Cardiac: RRR,  Respiratory: CTAB, normal effort Skin: multiple approx 1 mm in length skin colored skin tags over neck, two on face approx 2-3 mm. There are two skin tags inferior to left breast approx 5-6 mm in greatest diameter both with mild hyperpigmentation, no erythema, wamr or tenderness to palpation Neuro: alert and oriented, no focal deficits   Assessment & Plan:    Skin tag Two skin tags removed in office today ( see procedure note). Low suspiscion for malignancy given normal coloration and morphology - Should pt decide to remove others will follow up -will continue to follow remaining skin tags to ensure they remain low suspicion for malignancy     Jahvon Gosline A. Lincoln Brigham MD, [facility] Resident PGY-2 Pager [phone]
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
## 2015-05-02 NOTE — Assessment & Plan Note (Signed)
Two skin tags removed in office today ( see procedure note). Low suspiscion for malignancy given normal coloration and morphology - Should pt decide to remove others will follow up -will continue to follow remaining skin tags to ensure they remain low suspicion for malignancy

## 2015-05-02 NOTE — Progress Notes (Signed)
Skin tag Removal Procedure note  After written, informed consent was obtained and the desired lesions to be removed were confirmed, the site was prepped with alcohol swabs. Lidocaine spray was then used for anesthesia. After adequate analgesia was attained the skin tag was grasped with a pick up and excised with a small scissor. This was repeated on the second skin tag. Good hemostasis was attained with gentle pressure. The patient tolerated the procedure well  Beola Vasallo A. Lincoln Brigham MD, [facility] Resident PGY-2 Pager [phone]

## 2015-05-09 ENCOUNTER — Telehealth: Payer: Self-pay | Admitting: Student

## 2015-05-09 DIAGNOSIS — M25569 Pain in unspecified knee: Secondary | ICD-10-CM
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
## 2015-05-09 NOTE — Telephone Encounter (Signed)
Pt called and would like to speak to a nurse for the doctor about some of her symptoms she is having. jw

## 2015-05-10 ENCOUNTER — Emergency Department (INDEPENDENT_AMBULATORY_CARE_PROVIDER_SITE_OTHER): Payer: Medicaid Other

## 2015-05-10 ENCOUNTER — Emergency Department (INDEPENDENT_AMBULATORY_CARE_PROVIDER_SITE_OTHER)
Admission: EM | Admit: 2015-05-10 | Discharge: 2015-05-10 | Disposition: A | Discharge status: Home / Self Care | Payer: Medicaid Other | Source: Home / Self Care | Attending: Emergency Medicine | Admitting: Emergency Medicine

## 2015-05-10 ENCOUNTER — Encounter (HOSPITAL_COMMUNITY): Payer: Self-pay | Admitting: Emergency Medicine

## 2015-05-10 DIAGNOSIS — M129 Arthropathy, unspecified: Secondary | ICD-10-CM

## 2015-05-10 DIAGNOSIS — M25562 Pain in left knee: Secondary | ICD-10-CM | POA: Diagnosis not present

## 2015-05-10 DIAGNOSIS — M1712 Unilateral primary osteoarthritis, left knee: Secondary | ICD-10-CM

## 2015-05-10 MED ORDER — OXYCODONE-ACETAMINOPHEN 5-325 MG PO TABS: 2.0000 | ORAL_TABLET | ORAL | Status: DC | PRN

## 2015-05-10 MED ORDER — PREDNISONE 50 MG PO TABS: ORAL_TABLET | ORAL | Status: DC

## 2015-05-10 MED ORDER — MELOXICAM 15 MG PO TABS: 15.0000 mg | ORAL_TABLET | Freq: Every day | ORAL | Status: DC

## 2015-05-10 NOTE — Discharge Instructions (Signed)
Your x-ray shows arthritis in the knee. Part of this is a bone spur on the inside of your knee. This is likely why you are having continued pain after the fall. Take prednisone daily for 5 days to help get rid of the inflammation. Use the meloxicam daily for the next week, then as needed. Use the Percocet every 4-6 hours as needed for severe pain. Wear the knee sleeve when you are moving around. If it provides relief at night, you can wear it at night as well. Apply ice at least 3 times a day. If this is not improving in the next week, please follow-up with your orthopedic doctor.

## 2015-05-10 NOTE — ED Notes (Signed)
The patient presented to the [facility] with a complaint of left knee pain secondary to a fall that occurred on christmas. The patient stated that she did see her PCP when it occurred and they told her it was a hematoma. She stated that the hematoma has gone away but the pain and lack of mobility has gotten worse.

## 2015-05-10 NOTE — ED Provider Notes (Signed)
CSN: [id number]     Arrival date & time 05/10/15  1525 History   First MD Initiated Contact with Patient 05/10/15 1537     Chief Complaint  Patient presents with  . Knee Injury   (Consider location/radiation/quality/duration/timing/severity/associated sxs/prior Treatment) HPI  She is a 55 year old woman here for evaluation of left knee pain. She states she has some long-standing pain in the left knee for which she had a surgery several years ago. She had a fall on Christmas and developed a big bruise on the medial aspect of the knee. The bruising has resolved, but she continues to have significant pain in the medial aspect of the knee. It keeps her awake at night due to aching and throbbing pain. She states the pain will radiate up and down her leg. She denies any locking or popping.  Past Medical History  Diagnosis Date  . Hypertension   . Acid reflux   . Tachyarrhythmia   . Left shoulder pain   . Back pain   . Trigger point of thoracic region 03/22/2012  . Irregular heart beat   . Shortness of breath dyspnea     with exertion  . Diabetes mellitus without complication ([place])     on meds  . Depression   . Anxiety   . Headache     stress headaches, migraines at times  . Arthritis    Past Surgical History  Procedure Laterality Date  . Ankle surgery    . Rotator cuff repair Left 02/20/14  . Tubal ligation    . Lumbar laminectomy/decompression microdiscectomy Left 06/06/2014    Procedure: LUMBAR LAMINECTOMY/DECOMPRESSION MICRODISCECTOMY 1 LEVEL;  Surgeon: Newman Pies, MD;  Location: [facility];  Service: Neurosurgery;  Laterality: Left;  Left L5S1 microdiskectomy   Family History  Problem Relation Age of Onset  . Diabetes Brother   . Diabetes Maternal Aunt   . Diabetes Paternal Aunt   . Diabetes Maternal Grandmother   . Diabetes Maternal Grandfather   . Diabetes Paternal Grandmother   . Diabetes Paternal Grandfather    Social History  Substance Use Topics  . Smoking  status: Never Smoker   . Smokeless tobacco: Never Used  . Alcohol Use: Yes     Comment: occasionally   OB History    No data available     Review of Systems As in history of present illness Allergies  Soma; Nortriptyline; and Prednisone  Home Medications   Prior to Admission medications   Medication Sig Start Date End Date Taking? Authorizing Provider  ARIPiprazole (ABILIFY) 2 MG tablet Take 2 mg by mouth daily.    Historical Provider, MD  beta carotene w/minerals (OCUVITE) tablet Take 1 tablet by mouth daily.    Historical Provider, MD  buPROPion (WELLBUTRIN XL) 300 MG 24 hr tablet Take 300 mg by mouth daily.    Historical Provider, MD  diazepam (VALIUM) 5 MG tablet Take 5 mg by mouth every 6 (six) hours as needed for anxiety.    Historical Provider, MD  diclofenac sodium (VOLTAREN) 1 % GEL Apply 2 g topically 4 (four) times daily. 11/27/14   Katheren Shams, DO  diltiazem (CARDIZEM SR) 60 MG 12 hr capsule Take 1 capsule (60 mg total) by mouth 2 (two) times daily. 09/04/14   [name], MD  EPINEPHrine (EPIPEN) 0.3 mg/0.3 mL SOAJ Inject 0.3 mLs (0.3 mg total) into the muscle once. 11/02/12   [name], MD  fish oil-omega-3 fatty acids 1000 MG capsule Take 2  g by mouth daily.    Historical Provider, MD  hydrochlorothiazide (HYDRODIURIL) 25 MG tablet Take 1 tablet (25 mg total) by mouth daily. 05/13/14   [name], MD  HYDROcodone-acetaminophen (NORCO/VICODIN) 5-325 MG per tablet Take 1 tablet by mouth every 6 (six) hours as needed for moderate pain.  05/02/14   Historical Provider, MD  hydrOXYzine (ATARAX/VISTARIL) 10 MG tablet Take 10 mg by mouth 3 (three) times daily as needed for itching.    Historical Provider, MD  isosorbide mononitrate (IMDUR) 30 MG 24 hr tablet Take 30 mg by mouth daily.    Historical Provider, MD  meloxicam (MOBIC) 15 MG tablet Take 1 tablet (15 mg total) by mouth daily. 05/10/15   Melony Overly, MD  metoprolol tartrate (LOPRESSOR) 25 MG tablet  Take 1 tablet (25 mg total) by mouth 2 (two) times daily. 05/13/14   [name], MD  omeprazole (PRILOSEC) 20 MG capsule Take 1 capsule (20 mg total) by mouth daily. 11/27/14   Katheren Shams, DO  oxyCODONE-acetaminophen (PERCOCET/ROXICET) 5-325 MG tablet Take 2 tablets by mouth every 4 (four) hours as needed for severe pain. 05/10/15   Melony Overly, MD  predniSONE (DELTASONE) 50 MG tablet Take 1 pill daily for 5 days. 05/10/15   Melony Overly, MD  pregabalin (LYRICA) 100 MG capsule Take 1 capsule (100 mg total) by mouth 2 (two) times daily. 04/22/15   Alyssa A Lincoln Brigham, MD  SUMAtriptan (IMITREX) 50 MG tablet Take 1 tablet (50 mg total) by mouth every 2 (two) hours as needed for migraine or headache. Do not exceed 200 mg daily. 06/20/13   Coral Spikes, DO  traZODone (DESYREL) 100 MG tablet Take 2 tablets (200 mg total) by mouth at bedtime. 09/17/14   [name], MD   Meds Ordered and Administered this Visit  Medications - No data to display  BP 138/76 mmHg  Pulse 90  Temp(Src) 98.1 F (36.7 C)  SpO2 96% No data found.   Physical Exam  Constitutional: She is oriented to person, place, and time. She appears well-developed and well-nourished. No distress.  Cardiovascular: Normal rate.   Pulmonary/Chest: Effort normal.  Musculoskeletal:  Left knee: No erythema or edema. No bruising. No joint laxity. No patella or patellar tendon tenderness. She is tender over the medial joint line.  Neurological: She is alert and oriented to person, place, and time.    ED Course  Procedures (including critical care time)  Labs Review Labs Reviewed - No data to display  Imaging Review Dg Knee Complete 4 Views Left  05/10/2015  CLINICAL DATA:  Left knee pain, fall at Christmas EXAM: LEFT KNEE - COMPLETE 4+ VIEW COMPARISON:  02/07/2013 FINDINGS: Four views of the left knee submitted. No acute fracture or subluxation. Again noted spurring of medial femoral condyle. Mild narrowing of patellofemoral joint space.  Spurring of patella. Small joint effusion. Minimal narrowing of medial joint compartment. IMPRESSION: No acute fracture or subluxation. Degenerative changes as described above. Electronically Signed   By: Lahoma Crocker M.D.   On: 05/10/2015 16:53     MDM   1. Left knee pain   2. Arthritis of left knee    We'll treat with 5 days of prednisone and meloxicam. Knee sleeve given. Recommended icing regularly. Percocet as needed for pain. If not improving over the next week, follow-up with orthopedic doctor.    Melony Overly, MD 05/10/15 [phone]

## 2015-05-12 NOTE — Telephone Encounter (Signed)
Will forward Orthopedic referral request for arthritis in knees to PCP.

## 2015-05-12 NOTE — Telephone Encounter (Signed)
Would like referrals to be seen at [facility] because of her knees

## 2015-05-13 ENCOUNTER — Telehealth: Payer: Self-pay | Admitting: Student

## 2015-05-13 NOTE — Telephone Encounter (Signed)
Patient will need to be seen in office for discussion of knee pain prior to referral being made

## 2015-05-13 NOTE — Telephone Encounter (Signed)
Patient had a referral for her knee to ortho in July 2016.  It has been 6 months and needs a new one placed.  Do you still want her to make an appt?  Jazmin Hartsell,CMA

## 2015-05-14 NOTE — Telephone Encounter (Signed)
No she does not need an appointment. Ortho referral made

## 2015-05-28 ENCOUNTER — Ambulatory Visit: Payer: Medicaid Other | Attending: Neurosurgery | Admitting: Physical Therapy

## 2015-05-28 DIAGNOSIS — M545 Low back pain, unspecified: Secondary | ICD-10-CM

## 2015-05-28 DIAGNOSIS — R29898 Other symptoms and signs involving the musculoskeletal system: Secondary | ICD-10-CM

## 2015-05-28 DIAGNOSIS — M5386 Other specified dorsopathies, lumbar region: Secondary | ICD-10-CM

## 2015-05-28 DIAGNOSIS — M6289 Other specified disorders of muscle: Secondary | ICD-10-CM | POA: Diagnosis present

## 2015-05-28 DIAGNOSIS — G8929 Other chronic pain: Secondary | ICD-10-CM | POA: Insufficient documentation

## 2015-05-28 DIAGNOSIS — M2560 Stiffness of unspecified joint, not elsewhere classified: Secondary | ICD-10-CM | POA: Insufficient documentation

## 2015-05-28 DIAGNOSIS — R262 Difficulty in walking, not elsewhere classified: Secondary | ICD-10-CM

## 2015-05-28 NOTE — Patient Instructions (Addendum)
Knee to Chest (Flexion)    Pull knee toward chest. Feel stretch in lower back or buttock area. Breathing deeply, Hold __20-30__ seconds. Repeat with other knee. Repeat _2-3___ times. Do __3__ sessions per day.  http://gt2.exer.us/226   Copyright  VHI. All rights reserved.    Lower Trunk Rotation Stretch    Keeping back flat and feet together, rotate knees to left side. Hold _20-30___ seconds. Repeat _2-3___ times per set. Do __1__ sets per session. Do _3___ sessions per day.  http://orth.exer.us/123   Copyright  VHI. All rights reserved.    Pelvic Tilt (Flexion)    With feet flat and knees bent, flatten lower back into bed. Tighten stomach muscles. Hold __3-5__ seconds. Repeat __10__ times. Do _2-3___ sessions per day.  http://gt2.exer.us/230   Copyright  VHI. All rights reserved.    Bridging    Slowly raise buttocks from floor, keeping stomach tight. Hold 3-5 seconds. Repeat __10__ times per set. Do _2___ sets per session. Do __2__ sessions per day. (Can do every other day and build up to everyday.)  http://orth.exer.us/1097   Copyright  VHI. All rights reserved.   Side Leg Raise (Side-Lying)    Lie on side with support leg bent to 90. Lift top leg, leading with heel. Keep lifted leg straight. Hold 3 count. Lower leg to starting position. Repeat _10___ times for 2 sets on, each leg. (Can do every other day and build up to everyday)  Copyright  VHI. All rights reserved.

## 2015-05-28 NOTE — Therapy (Signed)
[place], [place], [address] Phone: [phone]   Fax:  [phone]  Physical Therapy Evaluation  Patient Details  Name: Norma [name] MRN: [id number] Date of Birth: 1960-06-20 Referring Provider: Newman Pies, MD  Encounter Date: 05/28/2015      PT End of Session - 05/28/15 1353    Visit Number 1   Number of Visits 1   PT Start Time 1310   PT Stop Time 1355   PT Time Calculation (min) 45 min   Activity Tolerance Patient tolerated treatment well   Behavior During Therapy [facility] for tasks assessed/performed      Past Medical History  Diagnosis Date  . Hypertension   . Acid reflux   . Tachyarrhythmia   . Left shoulder pain   . Back pain   . Trigger point of thoracic region 03/22/2012  . Irregular heart beat   . Shortness of breath dyspnea     with exertion  . Diabetes mellitus without complication ([place])     on meds  . Depression   . Anxiety   . Headache     stress headaches, migraines at times  . Arthritis     Past Surgical History  Procedure Laterality Date  . Ankle surgery    . Rotator cuff repair Left 02/20/14  . Tubal ligation    . Lumbar laminectomy/decompression microdiscectomy Left 06/06/2014    Procedure: LUMBAR LAMINECTOMY/DECOMPRESSION MICRODISCECTOMY 1 LEVEL;  Surgeon: Newman Pies, MD;  Location: [place];  Service: Neurosurgery;  Laterality: Left;  Left L5S1 microdiskectomy    There were no vitals filed for this visit.  Visit Diagnosis:  Chronic LBP  Weakness of both hips  Difficulty walking  Decreased ROM of lumbar spine      Subjective Assessment - 05/28/15 1312    Subjective Pt is a 55 y/o female that presents to OPPT for chronic LBP. Pt reported on way back to the exam room that today was one of her worse days for her back pain. Pt reported having 3 surgeries last year (2016), one was a Lt knee surgery in April and in September back surgery. Pt also  reported having the pain for years and has been to PT for this same condition before.    Pertinent History HTN, tachycardia, back pain, shortness of breath dyspenea, diabetes, depression, anxiety, arthritis    Limitations Standing;Walking;Sitting   How long can you sit comfortably? 15 min   How long can you stand comfortably? 5 min   How long can you walk comfortably? 10 min  knee and back   Patient Stated Goals not be in so much pain, confidently shower without fear of falling, walk further   Currently in Pain? Yes   Pain Score 7    Pain Location Back  R side behind shoulder   Pain Orientation Right;Left  radiates down L LE   Pain Descriptors / Indicators Sharp   Pain Type Chronic pain   Pain Radiating Towards L LE down to foot also with numbness   Pain Onset More than a month ago   Pain Frequency Intermittent   Aggravating Factors  activity and moving in the bed   Pain Relieving Factors narco 10 and repositioning             [facility] PT Assessment - 05/28/15 1320    Assessment   Medical Diagnosis chronic LBP   Referring Provider Newman Pies, MD   Onset Date/Surgical Date --  pt  believes in Sept 2016   Next MD Visit making appointment after PT visit   Prior Therapy yes, for LBP   Precautions   Precautions None   Restrictions   Weight Bearing Restrictions No   Balance Screen   Has the patient fallen in the past 6 months Yes   How many times? 2   Has the patient had a decrease in activity level because of a fear of falling?  Yes   Is the patient reluctant to leave their home because of a fear of falling?  Yes   [place] residence   Living Arrangements Children  live there on and off, no full time   [place] to enter   Entrance Stairs-Number of Steps 6   Entrance Stairs-Rails Right  has difficulty   Home Layout One level   [place] - single point   Prior Function   Level of Independence Independent with  household mobility with device  sometimes gets assistance with shower (in/out) and dress   Vocation On disability   Leisure shopping, walk and exercise   Posture/Postural Control   Posture/Postural Control Postural limitations   Postural Limitations Increased lumbar lordosis;Weight shift left   Posture Comments Pt unable to maintain one postition, shifted throughout the session   AROM   Lumbar Flexion 46   Lumbar Extension 3   Lumbar - Right Side Bend 25   Lumbar - Left Side Bend 28   Strength   Right Hip Flexion 4+/5   Right Hip Extension 3/5   Right Hip ABduction 3+/5   Left Hip Flexion 3/5   Left Hip Extension 3/5   Left Hip ABduction 3/5   Right Knee Flexion 5/5   Right Knee Extension 3/5  limited due to pain   Left Knee Flexion 5/5   Left Knee Extension 5/5   Palpation   Palpation comment Pain with iliac crest compression from lateral to medial, tight Lt erector spinae muscles and tight Rt infraspinatus   Straight Leg Raise   Findings Positive   Side  --  bil   Comment bil, pain laterally in low back region   Ambulation/Gait   Ambulation/Gait Yes   Ambulation/Gait Assistance 6: Modified independent (Device/Increase time)   Assistive device Straight cane  SPC   Gait Pattern Step-through pattern;Wide base of support;Decreased stride length                           PT Education - 05/28/15 1410    Education provided Yes   Education Details HEP and cane adjustment   Person(s) Educated Patient   Methods Explanation;Demonstration;Handout   Comprehension Verbalized understanding;Returned demonstration                    Plan - 05/28/15 1357    Clinical Impression Statement Pt is 55 y/o female who presents to OPPT with chronic LBP. Pt had decreased lumbar ROM, positive SLR bil, hip weakness bil, and Lt knee pain with extension. Pt balance was not assessed but she reported multiple falls and would benefit from further evaluation to assess  safety concerns. Pt would benefit from OPPT in order to help improve these deficits and pain to aid in return to physical activity and safe ambulation, however due to insurance coverage pt is unable to afford further physical therapy sessions.     PT Frequency One time visit   Consulted and Agree  with Plan of Care Patient         Problem List Patient Active Problem List   Diagnosis Date Noted  . Laceration of left upper arm 04/22/2015  . Skin tag 04/22/2015  . Health care maintenance 10/10/2014  . Proteinuria 09/17/2014  . Flank pain, chronic 08/27/2014  . Intercostal muscle pain 08/02/2014  . Other cyst of bone, right ankle and foot 06/21/2014  . Lumbar herniated disc 06/06/2014  . Left hip pain 04/20/2014  . Hip pain   . Depression   . Pre-diabetes 01/14/2014  . Nail avulsion, toe 01/14/2014  . Spinal stenosis, lumbar region, with neurogenic claudication 11/21/2013  . Hyperglycemia 08/15/2012  . Low back pain radiating to left leg 07/17/2012  . Somatic dysfunction - Osteopathic Findings 07/02/2012  . Right foot pain 06/30/2012  . Paroxysmal dyspnea 05/11/2012  . Rotator cuff tear, left 05/05/2012  . Chest pain 04/26/2012  . Decreased hearing 12/29/2011  . Eczema, dyshidrotic 06/02/2010  . Pain in right ankle 05/06/2010  . FIBROIDS, UTERUS 10/24/2009  . GERD 06/09/2009  . Chronic pain syndrome 10/04/2008  . HIP PAIN, LEFT, CHRONIC 08/16/2007  . Hepatitis C carrier 03/21/2007  . Obesity 06/16/2006  . DEPRESSION, MAJOR, RECURRENT 06/16/2006  . HYPERTENSION, BENIGN SYSTEMIC 06/16/2006    Ammie Ferrier, SPT 05/28/2015  4:03 PM  [facility] [address] [place], [place], [address] Phone: [phone]   Fax:  [phone]  Name: Norma [name] MRN: [id number] Date of Birth: 06/04/1960

## 2015-06-05 ENCOUNTER — Ambulatory Visit (INDEPENDENT_AMBULATORY_CARE_PROVIDER_SITE_OTHER): Payer: Medicaid Other | Admitting: Family Medicine

## 2015-06-05 ENCOUNTER — Encounter: Payer: Self-pay | Admitting: Family Medicine

## 2015-06-05 VITALS — BP 129/79 | HR 89 | Temp 98.1°F | Wt 245.0 lb

## 2015-06-05 DIAGNOSIS — M546 Pain in thoracic spine: Secondary | ICD-10-CM

## 2015-06-05 LAB — BASIC METABOLIC PANEL WITHOUT GFR
BUN: 12 mg/dL (ref 7–25)
CO2: 28 mmol/L (ref 20–31)
Calcium: 9.4 mg/dL (ref 8.6–10.4)
Chloride: 100 mmol/L (ref 98–110)
Creat: 0.94 mg/dL (ref 0.50–1.05)
GFR, Est African American: 78 mL/min
GFR, Est Non African American: 68 mL/min
Glucose, Bld: 97 mg/dL (ref 65–99)
Potassium: 3.9 mmol/L (ref 3.5–5.3)
Sodium: 136 mmol/L (ref 135–146)

## 2015-06-05 LAB — MAGNESIUM: Magnesium: 1.8 mg/dL (ref 1.5–2.5)

## 2015-06-05 MED ORDER — CYCLOBENZAPRINE HCL 10 MG PO TABS: 10.0000 mg | ORAL_TABLET | Freq: Three times a day (TID) | ORAL | Status: DC | PRN

## 2015-06-05 NOTE — Patient Instructions (Addendum)
Thank you for coming to see me today. It was a pleasure. Today we talked about:   Back pain/cramps: I will get some electrolytes. Also, I will prescribe a muscle relaxer. I recommend daily physical activity. I have included some stretches for back pain. If your symptoms do not improve, please follow-up  Please make an appointment to see Dr. Lincoln Brigham for follow-up  If you have any questions or concerns, please do not hesitate to call the office at [phone].  Sincerely,  Cordelia Poche, MD  Thoracic Strain A thoracic strain, which is sometimes called a mid-back strain, is an injury to the muscles or tendons that attach to the upper part of your back behind your chest. This type of injury occurs when a muscle is overstretched or overloaded.  Thoracic strains can range from mild to severe. Mild strains may involve stretching a muscle or tendon without tearing it. These injuries may heal in 1-2 weeks. More severe strains involve tearing of muscle fibers or tendons. These will cause more pain and may take 6-8 weeks to heal. CAUSES This condition may be caused by:  An injury in which a sudden force is placed on the muscle.  Exercising without properly warming up.  Overuse of the muscle.  Improper form during certain movements.  Other injuries that surround or cause stress on the mid-back, causing a strain on the muscles. In some cases, the cause may not be known. RISK FACTORS This injury is more common in:  Athletes.  People with obesity. SYMPTOMS The main symptom of this condition is pain, especially with movement. Other symptoms include:  Bruising.  Swelling.  Spasm. DIAGNOSIS This condition may be diagnosed with a physical exam. X-rays may be taken to check for a fracture. TREATMENT This condition may be treated with:  Resting and icing the injured area.  Physical therapy. This will involve doing stretching and strengthening exercises.  Medicines for pain and  inflammation. HOME CARE INSTRUCTIONS  Rest as needed. Follow instructions from your health care provider about any restrictions on activity.  If directed, apply ice to the injured area:  Put ice in a plastic bag.  Place a towel between your skin and the bag.  Leave the ice on for 20 minutes, 2-3 times per day.  Take over-the-counter and prescription medicines only as told by your health care provider.  Begin doing exercises as told by your health care provider or physical therapist.  Always warm up properly before physical activity or sports.  Bend your knees before you lift heavy objects.  Keep all follow-up visits as told by your health care provider. This is important. SEEK MEDICAL CARE IF:  Your pain is not helped by medicine.  Your pain, bruising, or swelling is getting worse.  You have a fever. SEEK IMMEDIATE MEDICAL CARE IF:  You have shortness of breath.  You have chest pain.  You develop numbness or weakness in your legs.  You have involuntary loss of urine (urinary incontinence).   This information is not intended to replace advice given to you by your health care provider. Make sure you discuss any questions you have with your health care provider.   Document Released: 06/26/2003 Document Revised: 12/25/2014 Document Reviewed: 05/30/2014 Elsevier Interactive Patient Education [employer].

## 2015-06-05 NOTE — Progress Notes (Signed)
    Subjective   Norma [name] is a 55 y.o. female that presents for a same day visit  1. Back pain: Symptoms started over about two months ago. She describes cramping in her upper back and legs. She has tried potassium pills and mustard which have not help. She describes sitting and lying down making the cramping worse. Sleeping improves symptoms. She has a history of palpitations for which she sees a cardiologist (she is being scheduled for a stress test). No trauma.  ROS Per HPI  Social History  Substance Use Topics  . Smoking status: Never Smoker   . Smokeless tobacco: Never Used  . Alcohol Use: Yes     Comment: occasionally    Allergies  Allergen Reactions  . Soma [Carisoprodol]     Itching/rash around mouth, itching back of throat.   . Nortriptyline Rash  . Prednisone Rash    REACTION: rash and itching.     Objective   BP 129/79 mmHg  Pulse 89  Temp(Src) 98.1 F (36.7 C) (Oral)  Wt 245 lb (111.131 kg)  General: Well appearing, no distress Musculoskeletal: Right sided tenderness along thoracic back that extends from paraspinal muscles to side thorax. Muscles feel tight.  Assessment and Plan   1. Right-sided thoracic back pain pain is reproducible - ice prn - may try exercises if ice and flexeril do not improve symptoms. - cyclobenzaprine (FLEXERIL) 10 MG tablet; Take 1 tablet (10 mg total) by mouth 3 (three) times daily as needed for muscle spasms.  Dispense: 30 tablet; Refill: 0 - BASIC METABOLIC PANEL WITH GFR - Magnesium

## 2015-06-06 ENCOUNTER — Encounter: Payer: Self-pay | Admitting: Family Medicine

## 2015-06-11 ENCOUNTER — Telehealth: Payer: Self-pay | Admitting: Student

## 2015-06-11 DIAGNOSIS — M25569 Pain in unspecified knee: Secondary | ICD-10-CM

## 2015-06-11 NOTE — Telephone Encounter (Signed)
Will forward referral request to Dr. Jerline Pain due to Dr. Lincoln Brigham not being in the office until 06/14/2015.

## 2015-06-11 NOTE — Telephone Encounter (Signed)
Referral to orthopedics placed.  Norma [name]. Jerline Pain, [facility] Resident PGY-2 06/11/2015 4:46 PM

## 2015-06-11 NOTE — Telephone Encounter (Signed)
Pt called because she would like a second opinion on her knee. She would like to get a referral to a different Ortho doctor, because her current ortho doctor said it is to soon for them to do surgery again on her knee. She doesn't want to wait and wants another opinion. jw

## 2015-06-13 ENCOUNTER — Telehealth: Payer: Self-pay | Admitting: Student

## 2015-06-13 NOTE — Telephone Encounter (Signed)
This is the second time we have referred her to pain clinic and she has canceled her appt. This has been noted in the referral

## 2015-06-13 NOTE — Telephone Encounter (Signed)
Will forward to MD and referral coordinator to make her aware. Amos Gaber,CMA

## 2015-06-13 NOTE — Telephone Encounter (Signed)
Pt called because she canceling her appointment to the pain clinic on [address]. She is going to keep seeing the doctor for her back. jw

## 2015-06-16 ENCOUNTER — Ambulatory Visit: Payer: Medicaid Other | Admitting: Physical Medicine & Rehabilitation

## 2015-07-04 ENCOUNTER — Telehealth: Payer: Self-pay | Admitting: Student

## 2015-07-04 NOTE — Telephone Encounter (Signed)
Patient dropped off handicapped placard form to be filled out.  Please call her when completed.

## 2015-07-07 NOTE — Telephone Encounter (Signed)
Placed Placard form in PCP's box for completion.

## 2015-07-14 NOTE — Telephone Encounter (Signed)
Patient informed that form is complete and ready for pick up.  Patient stated she had already picked a form up on Friday 07/11/15.  Will hold on this copy for now.  Derl Barrow, RN

## 2015-08-26 ENCOUNTER — Ambulatory Visit (INDEPENDENT_AMBULATORY_CARE_PROVIDER_SITE_OTHER): Payer: Medicaid Other | Admitting: Student

## 2015-08-26 ENCOUNTER — Encounter: Payer: Self-pay | Admitting: Student

## 2015-08-26 VITALS — BP 120/64 | HR 83 | Temp 98.0°F | Ht 68.0 in | Wt 248.0 lb

## 2015-08-26 DIAGNOSIS — G894 Chronic pain syndrome: Secondary | ICD-10-CM

## 2015-08-26 DIAGNOSIS — E669 Obesity, unspecified: Secondary | ICD-10-CM | POA: Diagnosis not present

## 2015-08-26 MED ORDER — CETIRIZINE HCL 5 MG PO TABS: 5.0000 mg | ORAL_TABLET | Freq: Every day | ORAL | Status: DC

## 2015-08-26 MED ORDER — DICLOFENAC SODIUM 1 % TD GEL: 2.0000 g | Freq: Four times a day (QID) | TRANSDERMAL | Status: DC

## 2015-08-26 MED ORDER — PREGABALIN 100 MG PO CAPS: 100.0000 mg | ORAL_CAPSULE | Freq: Two times a day (BID) | ORAL | Status: DC

## 2015-08-26 MED ORDER — HYDROCORTISONE 0.5 % EX CREA: 1.0000 "application " | TOPICAL_CREAM | Freq: Two times a day (BID) | CUTANEOUS | Status: DC

## 2015-08-26 NOTE — Patient Instructions (Signed)
Follow up in 3 months Please continue to exercise 3-4 times per week for at last 30 mins and increase this to 5 times per week Obtain twice as many veg's as protein or carbohydrate foods for both lunch and dinner. If you have questions or concerns call the office at [phone]

## 2015-08-26 NOTE — Progress Notes (Signed)
   Subjective:    Patient ID: Norma [name], female    DOB: 1960/08/17, 55 y.o.   MRN: [id number]   CC: right side back pain,   HPI: 55 y/o F with chronic back pain and right sided pain presents for continued right sided pain  Right side pain - First noted several years ago - she presents because while i has not gotten worse, she is concern because it not going away - previously treated with flexeril which helps as well as Norco which is prescribed by the surgeon who perfomed her back surgery  - denies weakness, loss of bladder or bowel function - denies abdominal pain -she feels her is contributed to by her weight and tries to walk 1-2 days per week   Smoking status reviewed  Review of Systems Denies recent illness, chest pain, SOB     Objective:  BP 120/64 mmHg  Pulse 83  Temp(Src) 98 F (36.7 C)  Ht 5\' 8"  (1.727 m)  Wt 112.492 kg (248 lb)  BMI 37.72 kg/m2  SpO2 98% Vitals and nursing note reviewed  General: NAD Cardiac: RRR,  Respiratory: CTAB, normal effort Abdomen: obese, soft, nontender, nondistended,  MSK: non tender spinous processes, mild tenderness to right paraspinous muscles,  Skin: warm and dry, no rashes noted Neuro: alert and oriented, no focal deficits   Assessment & Plan:    Chronic pain syndrome Continues to have back pain managed by neurosurgery. No red flag symptoms on exam or in history. Pain likely contributed to by her obesity (Body mass index is 37.72 kg/(m^2).  - encouraged to lose weight with increased exercise ad well as diet modification - patient expressed unerstanding and acceptance of this plan  Obesity Body mass index is 37.72 kg/(m^2). - diet and exercise discussed - she was counseled to exercise up to 5 days per week for 30 mins     Anysia Choi A. Lincoln Brigham MD, [facility] Resident PGY-2 Pager [phone]

## 2015-08-27 NOTE — Assessment & Plan Note (Signed)
Continues to have back pain managed by neurosurgery. No red flag symptoms on exam or in history. Pain likely contributed to by her obesity (Body mass index is 37.72 kg/(m^2).  - encouraged to lose weight with increased exercise ad well as diet modification - patient expressed unerstanding and acceptance of this plan

## 2015-08-27 NOTE — Assessment & Plan Note (Signed)
Body mass index is 37.72 kg/(m^2). - diet and exercise discussed - she was counseled to exercise up to 5 days per week for 30 mins

## 2015-09-05 ENCOUNTER — Other Ambulatory Visit: Payer: Self-pay | Admitting: *Deleted

## 2015-09-05 MED ORDER — PREGABALIN 100 MG PO CAPS: 100.0000 mg | ORAL_CAPSULE | Freq: Two times a day (BID) | ORAL | Status: DC

## 2015-09-08 ENCOUNTER — Other Ambulatory Visit: Payer: Self-pay

## 2015-09-08 DIAGNOSIS — Z1231 Encounter for screening mammogram for malignant neoplasm of breast: Secondary | ICD-10-CM

## 2015-09-12 ENCOUNTER — Telehealth: Payer: Self-pay | Admitting: *Deleted

## 2015-09-12 MED ORDER — PREGABALIN 100 MG PO CAPS: 100.0000 mg | ORAL_CAPSULE | Freq: Two times a day (BID) | ORAL | Status: DC

## 2015-09-12 NOTE — Telephone Encounter (Signed)
Prior Authorization received from [employer] for lyrica 100 mg. PA approved via [place] [employer] until 09/11/2016; approval number [id number].  [facility] is aware of approval. Verbal order given by Dr. Lincoln Brigham to change to 30 day supply #60.   Derl Barrow, RN

## 2015-09-18 ENCOUNTER — Ambulatory Visit
Admission: RE | Admit: 2015-09-18 | Discharge: 2015-09-18 | Disposition: A | Discharge status: Home / Self Care | Payer: Medicaid Other | Source: Ambulatory Visit

## 2015-09-18 DIAGNOSIS — Z1231 Encounter for screening mammogram for malignant neoplasm of breast: Secondary | ICD-10-CM

## 2015-09-23 ENCOUNTER — Telehealth: Payer: Self-pay | Admitting: Student

## 2015-09-23 NOTE — Telephone Encounter (Signed)
Will forward to MD to order. Jazmin Hartsell,CMA

## 2015-09-23 NOTE — Telephone Encounter (Signed)
Wants to know if dr will order her a colon guard test. It is a test you can go at home.  Please advise

## 2015-09-24 NOTE — Telephone Encounter (Signed)
Do we offer cologuard at the [facility]?

## 2015-09-24 NOTE — Telephone Encounter (Signed)
Order form printed online and filled out.  Placed in provider's box to sign and will then call patient to have her sign her portion of form. Shishir Krantz,CMA

## 2015-10-09 ENCOUNTER — Encounter: Payer: Self-pay | Admitting: Family Medicine

## 2015-10-09 ENCOUNTER — Ambulatory Visit (INDEPENDENT_AMBULATORY_CARE_PROVIDER_SITE_OTHER): Payer: Medicaid Other | Admitting: Family Medicine

## 2015-10-09 VITALS — BP 134/59 | HR 89 | Temp 97.9°F | Wt 241.0 lb

## 2015-10-09 DIAGNOSIS — R21 Rash and other nonspecific skin eruption: Secondary | ICD-10-CM | POA: Insufficient documentation

## 2015-10-09 MED ORDER — KETOCONAZOLE 2 % EX SHAM: 1.0000 "application " | MEDICATED_SHAMPOO | CUTANEOUS | Status: DC

## 2015-10-09 NOTE — Assessment & Plan Note (Addendum)
Most consistent with seborrheic dermatitis. No systemic symptoms to suggest SLE and no telangiectasias to suggest rosacea, however both are still in the differential. Would expect a more generalized rash if it was an allergic reaction to medication. Will treat with topical ketoconazole. Return precautions reviewed. If not improving, consider adding low potency steroid.

## 2015-10-09 NOTE — Patient Instructions (Addendum)
I think you have seborrheic dermatitis. This is a local reaction that commonly occurs on the face. We will treat it with ketoconazole shampoo. Lather on for 5-10 minutes then wash off. Repeat every day for 4-5 days, then 2-3 times a week as needed.  If your rash is getting worse, or not improving within the next couple of weeks, let us know.   Take care,  Dr Jerline Pain  Seborrheic Dermatitis Seborrheic dermatitis involves pink or red skin with greasy, flaky scales. It usually occurs on the scalp, and it is often called dandruff. This condition may also affect the eyebrows, nose, ears, chest, and the bearded area of men's faces. It often occurs where skin has more oil (sebaceous) glands. It may come and go for no known reason, and it is often long-lasting (chronic). CAUSES The cause is not known. RISK FACTORS This condition is more like to develop in:  People who are stressed or tired.  People who have skin conditions, such as acne.  People who have certain conditions, such as:  HIV (human immunodeficiency virus).  AIDS (acquired immunodeficiency syndrome).  Parkinson disease.  An eating disorder.  Stroke.  Depression.  Epilepsy.  Alcoholism.  People who live in places that have extreme weather.  People who have a family history of seborrheic dermatitis.  People who use skin creams that are made with alcohol.  People who are 43-94 years old.  People who take certain medicines. SYMPTOMS Symptoms of this condition include:  Thick scales on the scalp.  Redness on the face or in the armpits.  Skin that is flaky. The flakes may be white or yellow.  Skin that seems oily or dry but is not helped with moisturizers.  Itching or burning in the affected areas. DIAGNOSIS This condition is diagnosed with a medical history and physical exam. A sample of your skin may be tested (skin biopsy). You may need to see a skin specialist (dermatologist). TREATMENT There is no cure  for this condition, but treatment can help to manage the symptoms. Treatment may include:  Cortisone (steroid) ointments, creams, and lotions.  Over-the-counter or prescription shampoos. HOME CARE INSTRUCTIONS  Apply over-the-counter and prescription medicines only as told by your health care provider.  Keep all follow-up visits as told by your health care provider. This is important.  Try to reduce your stress, such as with yoga or mediation. If you need help to reduce stress, ask your health care provider.  Shower or bathe as told by your health care provider.  Use any medicated shampoos as told by your health care provider. SEEK MEDICAL CARE IF:  Your symptoms do not improve with treatment.  Your symptoms get worse.  You have new symptoms.   This information is not intended to replace advice given to you by your health care provider. Make sure you discuss any questions you have with your health care provider.   Document Released: 04/05/2005 Document Revised: 12/25/2014 Document Reviewed: 08/21/2014 Elsevier Interactive Patient Education [employer].

## 2015-10-09 NOTE — Progress Notes (Signed)
    Subjective:  Norma [name] is a 55 y.o. female who presents to the [facility] today with a chief complaint of facial rash.   HPI:  Rash Started around 3 weeks ago. Located on her cheek and around eyebrows. Rash is worse in the morning. Occasionally itchy. Some flakes. No medications tried. Patient was recently started on methocarbamol by her podiatrist but has not had any other recent medication changes. No rash on her trunk or extremities. No new lotions, soaps, perfumes, makeup, or detergents. No fevers or chills. No new joint [name].   ROS: Per HPI  Objective:  Physical Exam: BP 134/59 mmHg  Pulse 89  Temp(Src) 97.9 F (36.6 C) (Oral)  Wt 241 lb (109.317 kg)  SpO2 93%  Gen: NAD, resting comfortably Skin: Faintly erythematous rash noted at nasolabial folds bilateral and extending to cheeks. Erythematous rash also noted along brow ridge. Mild flaking noted between eyebrows and along nasolabial fold.   Assessment/Plan:  Rash and nonspecific skin eruption Most consistent with seborrheic dermatitis. No systemic symptoms to suggest SLE and no telangiectasias to suggest rosacea, however both are still in the differential. Would expect a more generalized rash if it was an allergic reaction to medication. Will treat with topical ketoconazole. Return precautions reviewed. If not improving, consider adding low potency steroid.   Algis Greenhouse. Norma [name], [place] Resident PGY-2 10/09/2015 4:03 PM

## 2015-10-28 ENCOUNTER — Ambulatory Visit: Payer: Medicaid Other | Admitting: Obstetrics and Gynecology

## 2015-11-01 ENCOUNTER — Other Ambulatory Visit: Payer: Self-pay | Admitting: Student

## 2015-11-05 ENCOUNTER — Ambulatory Visit (INDEPENDENT_AMBULATORY_CARE_PROVIDER_SITE_OTHER): Payer: Medicaid Other | Admitting: Internal Medicine

## 2015-11-05 ENCOUNTER — Encounter: Payer: Self-pay | Admitting: Internal Medicine

## 2015-11-05 VITALS — BP 133/75 | HR 92 | Temp 98.3°F | Wt 236.0 lb

## 2015-11-05 DIAGNOSIS — IMO0002 Reserved for concepts with insufficient information to code with codable children: Secondary | ICD-10-CM | POA: Insufficient documentation

## 2015-11-05 DIAGNOSIS — R21 Rash and other nonspecific skin eruption: Secondary | ICD-10-CM

## 2015-11-05 DIAGNOSIS — L03011 Cellulitis of right finger: Secondary | ICD-10-CM | POA: Diagnosis not present

## 2015-11-05 MED ORDER — HYDROCORTISONE 0.5 % EX CREA: 1.0000 "application " | TOPICAL_CREAM | Freq: Two times a day (BID) | CUTANEOUS | Status: DC

## 2015-11-05 MED ORDER — DOXYCYCLINE MONOHYDRATE 100 MG PO CAPS: 100.0000 mg | ORAL_CAPSULE | Freq: Two times a day (BID) | ORAL | Status: DC

## 2015-11-05 NOTE — Patient Instructions (Signed)
It was so nice to meet you!  I have prescribed an antibiotic for your toe called Doxycycline. Please take 1 tablet twice a day for 10 days.  I have also prescribed Hydrocortisone cream. You can use this on your face twice a day. You should also use a lotion called Aquaphor twice a day to help with the dryness.  Please come back if your toe is not getting better.  -Dr. Brett Albino

## 2015-11-05 NOTE — Progress Notes (Signed)
[facility] Phone: [phone]  Subjective:  Rash on face: Started 6 months ago. She was seen here and prescribed Ketoconazole shampoo, which didn't help at all. The rash is itchy and associated with dry skin. The rash is more itchy when she goes out into the sun. She has noticed that the rash is worse at night. The rash consists of small red bumps.  Toe pain: Toenail taken off in June by Podiatry. There was a mix-up with her Medicaid card, which said that her foot doctor was in [place]. She has noticed pus under the toenail. She was on an antibiotic for 10 days in the middle of June and the pus didn't get any better. The pain hasn't gotten better at all. No fevers, no chills, spreading redness.   ROS: See HPI for pertinent positives and negatives Past Medical History- HTN, eczema, pre-diabetes, depression Reviewed problem list.  Medications- reviewed and updated Current Outpatient Prescriptions  Medication Sig Dispense Refill  . ARIPiprazole (ABILIFY) 2 MG tablet Take 2 mg by mouth daily.    . beta carotene w/minerals (OCUVITE) tablet Take 1 tablet by mouth daily.    Marland Kitchen buPROPion (WELLBUTRIN XL) 300 MG 24 hr tablet Take 300 mg by mouth daily.    . cetirizine (ZYRTEC) 5 MG tablet Take 1 tablet (5 mg total) by mouth daily. 30 tablet 3  . cyclobenzaprine (FLEXERIL) 10 MG tablet Take 1 tablet (10 mg total) by mouth 3 (three) times daily as needed for muscle spasms. 30 tablet 0  . diazepam (VALIUM) 5 MG tablet Take 5 mg by mouth every 6 (six) hours as needed for anxiety.    . diclofenac sodium (VOLTAREN) 1 % GEL Apply 2 g topically 4 (four) times daily. 100 g 3  . diltiazem (CARDIZEM SR) 60 MG 12 hr capsule Take 1 capsule (60 mg total) by mouth 2 (two) times daily. 180 capsule 1  . EPINEPHrine (EPIPEN) 0.3 mg/0.3 mL SOAJ Inject 0.3 mLs (0.3 mg total) into the muscle once. 1 Device 1  . fish oil-omega-3 fatty acids 1000 MG capsule Take 2 g by mouth daily.    .  hydrochlorothiazide (HYDRODIURIL) 25 MG tablet Take 1 tablet (25 mg total) by mouth daily. 90 tablet 3  . HYDROcodone-acetaminophen (NORCO/VICODIN) 5-325 MG per tablet Take 1 tablet by mouth every 6 (six) hours as needed for moderate pain.   0  . hydrocortisone cream 0.5 % Apply 1 application topically 2 (two) times daily. 30 g 0  . hydrOXYzine (ATARAX/VISTARIL) 10 MG tablet Take 10 mg by mouth 3 (three) times daily as needed for itching. Reported on 08/26/2015    . isosorbide mononitrate (IMDUR) 30 MG 24 hr tablet Take 30 mg by mouth daily.    Marland Kitchen ketoconazole (NIZORAL) 2 % shampoo Apply 1 application topically 2 (two) times a week. 120 mL 1  . meloxicam (MOBIC) 15 MG tablet Take 1 tablet (15 mg total) by mouth daily. (Patient not taking: Reported on 08/26/2015) 30 tablet 0  . metoprolol tartrate (LOPRESSOR) 25 MG tablet Take 1 tablet (25 mg total) by mouth 2 (two) times daily. 180 tablet 3  . omeprazole (PRILOSEC) 20 MG capsule take 1 capsule by mouth once daily 30 capsule 3  . predniSONE (DELTASONE) 50 MG tablet Take 1 pill daily for 5 days. (Patient not taking: Reported on 05/28/2015) 5 tablet 0  . pregabalin (LYRICA) 100 MG capsule Take 1 capsule (100 mg total) by mouth 2 (two) times daily. 60 capsule  5  . traZODone (DESYREL) 100 MG tablet Take 2 tablets (200 mg total) by mouth at bedtime. 180 tablet 3   No current facility-administered medications for this visit.   Chief complaint-noted Family history reviewed for today's visit. No changes. Social history- patient is a never smoker  Objective: BP 133/75 mmHg  Pulse 92  Temp(Src) 98.3 F (36.8 C) (Oral)  Wt 236 lb (107.049 kg)  SpO2 97% Gen: NAD, alert, cooperative with exam HEENT: NCAT, EOMI, MMM Msk: Right great toe is swollen and tender to the touch, pus present at the base of the toenail, no spreading redness. Neuro: Alert and oriented, no gross deficits Skin: Dry skin present on either side of the mouth, no papules or rash  present. Psych: Appropriate behavior  Assessment/Plan: Paronychia with infection: Pt with swollen, tender right great toe and pus present at the base of the nail. No signs of cellulitis. Pt recently treated with an antibiotic without improvement. Has not been able to follow-up with the Podiatrist because her Medicaid card lists a Podiatrist in [place]. - Will prescribe Doxycycline 100mg  bid x 10 days - Return precautions given - Follow-up if no improvement - Pt encouraged to follow-up with the Podiatrist that took off her nail  Rash on face: Pt endorsing rash with small erythematous papules on either side of her mouth, associated with dry skin. On exam, no papules are seen, but face skin appears dry. - Encouraged Pt to use Aquaphor daily for dry skin - Pt states hydrocortisone cream has helped in the past, but warned Pt that this may cause hypopigmentation of the skin. Pt voiced understanding. Refill for low potency hydrocortisone cream sent. - Follow-up if not improved   Hyman Bible, MD PGY-2

## 2015-11-05 NOTE — Assessment & Plan Note (Signed)
Pt with swollen, tender right great toe and pus present at the base of the nail. No signs of cellulitis. Pt recently treated with an antibiotic without improvement. Has not been able to follow-up with the Podiatrist because her Medicaid card lists a Podiatrist in [place]. - Will prescribe Doxycycline 100mg  bid x 10 days - Return precautions given - Follow-up if no improvement - Pt encouraged to follow-up with the Podiatrist that took off her nail

## 2015-11-05 NOTE — Assessment & Plan Note (Signed)
Pt endorsing rash with small erythematous papules on either side of her mouth, associated with dry skin. On exam, no papules are seen, but face skin appears dry. - Encouraged Pt to use Aquaphor daily for dry skin - Pt states hydrocortisone cream has helped in the past, but warned Pt that this may cause hypopigmentation of the skin. Pt voiced understanding. Refill for low potency hydrocortisone cream sent. - Follow-up if not improved

## 2015-11-21 ENCOUNTER — Encounter: Payer: Self-pay | Admitting: Internal Medicine

## 2015-11-21 ENCOUNTER — Ambulatory Visit (INDEPENDENT_AMBULATORY_CARE_PROVIDER_SITE_OTHER): Payer: Medicaid Other | Admitting: Internal Medicine

## 2015-11-21 VITALS — BP 160/79 | HR 68 | Temp 98.1°F | Ht 68.0 in | Wt 239.2 lb

## 2015-11-21 DIAGNOSIS — R21 Rash and other nonspecific skin eruption: Secondary | ICD-10-CM | POA: Diagnosis not present

## 2015-11-21 MED ORDER — DESONIDE 0.05 % EX CREA: TOPICAL_CREAM | Freq: Two times a day (BID) | CUTANEOUS | 0 refills | Status: DC

## 2015-11-21 NOTE — Progress Notes (Signed)
   [facility] Norma Mo, MD Phone: [phone]  Reason For Visit: Rash, follow up   # Facial rash for about 6 months which she states itches at times. States rash associated with itching, burns if patient scratches it to much Hydrocortisone did not make patient stop itching. Also received ketoconazole shampoo, that did not help at all. Indicates vasoline helped some at night. Patient has used triamcelone cream in the past prescribed to someone else, which did help. However she stopped because she knew that she was not suppose to use this on her face.  No recent medication changes. No new lotions, soaps, perfumes, ore makeup. No fever or chills. No new joint points.   Past Medical History Reviewed problem list.  Medications- reviewed and updated No additions to family history  Objective: BP (!) 160/79   Pulse 68   Temp 98.1 F (36.7 C) (Oral)   Ht 5\' 8"  (1.727 m)   Wt 239 lb 3.2 oz (108.5 kg)   BMI 36.37 kg/m  Gen: NAD, alert, cooperative with exam Rash: erythematous rash following the cheek bones. Excoriations, however no papules or discernable lesions   Assessment/Plan: See problem based a/p  Rash and nonspecific skin eruption Atopic Dermatitis vs. Seborrheic dermatitis  - desonide (DESOWEN) 0.05 % cream; Apply topically 2 (two) times daily. Can use for 7-10 days.  Dispense: 30 g; Refill: 0 - Continue moisturizer

## 2015-11-21 NOTE — Patient Instructions (Signed)
Desonide cream, please use twice daily for 7- 10 days or less if irritation clears up

## 2015-11-24 NOTE — Assessment & Plan Note (Addendum)
Atopic Dermatitis vs. Seborrheic dermatitis  - desonide (DESOWEN) 0.05 % cream; Apply topically 2 (two) times daily. Can use for 7-10 days.  Dispense: 30 g; Refill: 0 - Continue moisturizer

## 2015-11-26 ENCOUNTER — Telehealth: Payer: Self-pay | Admitting: Student

## 2015-11-26 DIAGNOSIS — M25579 Pain in unspecified ankle and joints of unspecified foot: Secondary | ICD-10-CM

## 2015-11-26 NOTE — Telephone Encounter (Signed)
Pt would like another referral for the [facility] [address]. Pt is still having foot and ankle trouble. Please advise. Thanks! ep

## 2015-11-26 NOTE — Telephone Encounter (Signed)
Will forward to MD.  Patient's last referral was in 2016 and it only lasts 6 months. Augusta Hilbert,CMA

## 2015-11-27 NOTE — Telephone Encounter (Signed)
Referral to foot and ankle made

## 2015-12-18 ENCOUNTER — Ambulatory Visit (INDEPENDENT_AMBULATORY_CARE_PROVIDER_SITE_OTHER): Payer: Medicaid Other | Admitting: Student

## 2015-12-18 ENCOUNTER — Encounter: Payer: Self-pay | Admitting: Student

## 2015-12-18 VITALS — BP 152/69 | Temp 98.1°F | Wt 239.0 lb

## 2015-12-18 DIAGNOSIS — E669 Obesity, unspecified: Secondary | ICD-10-CM | POA: Diagnosis not present

## 2015-12-18 DIAGNOSIS — G894 Chronic pain syndrome: Secondary | ICD-10-CM

## 2015-12-18 DIAGNOSIS — G609 Hereditary and idiopathic neuropathy, unspecified: Secondary | ICD-10-CM | POA: Diagnosis present

## 2015-12-18 DIAGNOSIS — M546 Pain in thoracic spine: Secondary | ICD-10-CM

## 2015-12-18 MED ORDER — DICLOFENAC SODIUM 1 % TD GEL: 2.0000 g | Freq: Four times a day (QID) | TRANSDERMAL | 3 refills | Status: DC

## 2015-12-18 MED ORDER — CYCLOBENZAPRINE HCL 10 MG PO TABS: 10.0000 mg | ORAL_TABLET | Freq: Three times a day (TID) | ORAL | 4 refills | Status: DC | PRN

## 2015-12-18 MED ORDER — PREGABALIN 100 MG PO CAPS: 100.0000 mg | ORAL_CAPSULE | Freq: Two times a day (BID) | ORAL | 5 refills | Status: DC

## 2015-12-18 NOTE — Patient Instructions (Signed)
Follow-up in 3 months follow-up in 3 months Follow with orthopedics for hip pain, and podiatry for neuropathy  Your given information for colonoscopy. Please obtain this as soon as possible if you have questions or concerns call the office at [phone]

## 2015-12-18 NOTE — Progress Notes (Signed)
   Subjective:    Patient ID: Norma [name], female    DOB: 03-02-1961, 55 y.o.   MRN: [id number]   CC: Physical exam  HPI: 55 y/o F presenting for phyical exam, reports continued hip pain  Chronic hip pain - present for many years previously managed by orthopedics - she previously declined surgery but now wishes to consider pursuing this - asks for referal to Ortho for this - denies worsening of her pain, recent falls, weakness  Obesity - has been walking more and trying to eat more healthfully  Smoking status reviewed  Review of Systems  Per HPI, else denies recent illness, fever, headache, changes in vision, chest pain, shortness of breath, abdominal pain, N/V/D, weakness    Objective:  BP (!) 152/69   Temp 98.1 F (36.7 C) (Oral)   Wt 239 lb (108.4 kg)   SpO2 95%   BMI 36.34 kg/m  Vitals and nursing note reviewed  General: NAD Cardiac: RRR, Respiratory: CTAB, normal effort Extremities: no edema or cyanosis Skin: warm and dry, no rashes noted Neuro: alert and oriented,    Assessment & Plan:    Obesity Continue counsel on healthy diet and exercise. Discussed exercise 30 mins per day and healthy diet  HIP PAIN, LEFT, CHRONIC Referral to orthopedics for evaluation and consideration of surgery  Foot Care - see by podiatry for foot care - referral made per paient request  Reilly Molchan A. Lincoln Brigham MD, [facility] Resident PGY-3 Pager [phone]

## 2015-12-19 NOTE — Assessment & Plan Note (Signed)
Continue counsel on healthy diet and exercise. Discussed exercise 30 mins per day and healthy diet

## 2015-12-19 NOTE — Assessment & Plan Note (Signed)
Referral to orthopedics for evaluation and consideration of surgery

## 2015-12-31 ENCOUNTER — Other Ambulatory Visit: Payer: Self-pay | Admitting: Student

## 2015-12-31 DIAGNOSIS — M549 Dorsalgia, unspecified: Secondary | ICD-10-CM

## 2016-01-02 ENCOUNTER — Telehealth: Payer: Self-pay | Admitting: *Deleted

## 2016-01-02 NOTE — Telephone Encounter (Signed)
Pt needs a referral to go back to see the dr that did surgery on her back. Please advise and call pt back. Gaspare Netzel Kennon Holter, CMA

## 2016-01-05 ENCOUNTER — Telehealth: Payer: Self-pay | Admitting: Student

## 2016-01-05 DIAGNOSIS — M5440 Lumbago with sciatica, unspecified side: Secondary | ICD-10-CM

## 2016-01-05 NOTE — Telephone Encounter (Signed)
Referral to ortho sent

## 2016-01-05 NOTE — Telephone Encounter (Signed)
Patient called regarding request for referral. She did not answer so a message was left

## 2016-01-05 NOTE — Telephone Encounter (Signed)
Patient would like to go back to [facility] for her back pain. Lyla Jasek,CMA

## 2016-01-16 ENCOUNTER — Telehealth: Payer: Self-pay | Admitting: Student

## 2016-01-16 NOTE — Telephone Encounter (Signed)
Please inform the patient that she should make sur she drink plently of water for her cramping. If she feels she is adequately hydrated she should come to the clinic for evaluation

## 2016-01-16 NOTE — Telephone Encounter (Signed)
Will forward to MD to advise. Jazmin Hartsell,CMA  

## 2016-01-16 NOTE — Telephone Encounter (Signed)
Pt called because she is having some very serious leg cramps and would like to know what she can do. jw

## 2016-01-20 ENCOUNTER — Ambulatory Visit: Payer: Medicaid Other | Admitting: Family Medicine

## 2016-01-20 NOTE — Telephone Encounter (Signed)
Apt scheduled for this afternoon with Dr. Jerline Pain.

## 2016-03-01 ENCOUNTER — Telehealth: Payer: Self-pay | Admitting: Student

## 2016-03-01 ENCOUNTER — Other Ambulatory Visit: Payer: Self-pay | Admitting: Student

## 2016-03-01 NOTE — Telephone Encounter (Signed)
Pt is calling for a refill on her hydroxyzine to be called in to the [facility] on [address]. jw

## 2016-05-25 DIAGNOSIS — M5416 Radiculopathy, lumbar region: Secondary | ICD-10-CM | POA: Diagnosis not present

## 2016-05-25 DIAGNOSIS — I10 Essential (primary) hypertension: Secondary | ICD-10-CM | POA: Diagnosis not present

## 2016-05-25 DIAGNOSIS — M5136 Other intervertebral disc degeneration, lumbar region: Secondary | ICD-10-CM | POA: Diagnosis not present

## 2016-05-25 DIAGNOSIS — M545 Low back pain: Secondary | ICD-10-CM | POA: Diagnosis not present

## 2016-06-03 ENCOUNTER — Ambulatory Visit: Payer: Medicaid Other | Admitting: *Deleted

## 2016-06-10 ENCOUNTER — Ambulatory Visit (INDEPENDENT_AMBULATORY_CARE_PROVIDER_SITE_OTHER): Payer: Medicare Other | Admitting: Student

## 2016-06-10 ENCOUNTER — Encounter: Payer: Self-pay | Admitting: Student

## 2016-06-10 DIAGNOSIS — R109 Unspecified abdominal pain: Secondary | ICD-10-CM | POA: Diagnosis not present

## 2016-06-10 DIAGNOSIS — G8929 Other chronic pain: Secondary | ICD-10-CM | POA: Diagnosis not present

## 2016-06-10 NOTE — Assessment & Plan Note (Signed)
Chronic flank pain, likey due to arthritic changes - will continue current norco, flexeril - encouraged her to try adding tylenol PRN - also encouraged her to continue to exercise and improve diet as reduced weight can help chronic pain - will continue to follow

## 2016-06-10 NOTE — Progress Notes (Signed)
   Subjective:    Patient ID: Norma [name], female    DOB: 07-19-1960, 56 y.o.   MRN: [id number]   CC: Bilateral rib pain  HPI: 56 y/o F presents for chronic bilateral rib pain  Bilateral Rib pain - chronic in nature - she feels it has been worse over the last 2-3 days since she has started trying to be more active - she denies back pain or weakness - she denies trauma   Smoking status reviewed  Review of Systems  Per HPI, else denies recent illness, fever, chest pain, shortness of breath   Objective:  BP 118/71   Pulse 89   Temp 98.1 F (36.7 C) (Oral)   Wt 242 lb (109.8 kg)   SpO2 96%   BMI 36.80 kg/m  Vitals and nursing note reviewed  General: NAD Cardiac: RRR Respiratory: CTAB, normal effort MSK: tenderness over bilateral flanks extending anteriorly to the midclavicular line, no bony back tenderness, no overlying skin changes Abdomen: obese, soft, non tender Skin: warm and dry, no rashes noted Neuro: alert and oriented, no focal deficits   Assessment & Plan:    Flank pain, chronic Chronic flank pain, likey due to arthritic changes - will continue current norco, flexeril - encouraged her to try adding tylenol PRN - also encouraged her to continue to exercise and improve diet as reduced weight can help chronic pain - will continue to follow    [name] Brigham MD, [facility] Resident PGY-3 Pager [phone]

## 2016-06-10 NOTE — Patient Instructions (Addendum)
Follow up as needed Obtain twice as many veg's as protein or carbohydrate foods for both lunch and dinner. Get 30 mins of exercise per day Try to take tylenol and motrin for pain before taking the Norco If you have questions or concerns, call the office at [phone]

## 2016-06-11 ENCOUNTER — Other Ambulatory Visit: Payer: Self-pay | Admitting: Student

## 2016-06-11 NOTE — Telephone Encounter (Signed)
Pt is calling for a refill on her Prilosec. She would also like a cologuard sent to her. jw

## 2016-06-11 NOTE — Telephone Encounter (Signed)
LM for patient to call back.  Please inform her that she will need to come by the office to sign the order for cologuard before we can fax it over. Allura Doepke,CMA

## 2016-06-14 NOTE — Telephone Encounter (Signed)
LM for patient to call back.  She needs to come by to sign her cologuard form.  Kiannah Grunow,CMA

## 2016-07-05 ENCOUNTER — Telehealth: Payer: Self-pay | Admitting: Family Medicine

## 2016-07-05 DIAGNOSIS — G8929 Other chronic pain: Secondary | ICD-10-CM

## 2016-07-05 DIAGNOSIS — M546 Pain in thoracic spine: Principal | ICD-10-CM

## 2016-07-05 MED ORDER — CYCLOBENZAPRINE HCL 10 MG PO TABS: 10.0000 mg | ORAL_TABLET | Freq: Three times a day (TID) | ORAL | 2 refills | Status: DC | PRN

## 2016-07-05 NOTE — Telephone Encounter (Signed)
Methocarbamol not on medication list. Flexeril is. Will refill Flexeril at this time.

## 2016-07-05 NOTE — Telephone Encounter (Signed)
Pt calling to request refill of:  Name of Medication(s): Methocarbamol Last date of OV:  06-10-16 Pharmacy:  [facility]   Will route refill request to Clinic RN.  Discussed with patient policy to call pharmacy for future refills.  Also, discussed refills may take up to 48 hours to approve or deny.  Renella Cunas

## 2016-07-07 DIAGNOSIS — M792 Neuralgia and neuritis, unspecified: Secondary | ICD-10-CM | POA: Diagnosis not present

## 2016-07-07 DIAGNOSIS — M722 Plantar fascial fibromatosis: Secondary | ICD-10-CM | POA: Diagnosis not present

## 2016-07-23 ENCOUNTER — Ambulatory Visit: Payer: Medicare Other | Admitting: Family Medicine

## 2016-07-23 ENCOUNTER — Encounter: Payer: Self-pay | Admitting: Family Medicine

## 2016-07-23 ENCOUNTER — Ambulatory Visit (INDEPENDENT_AMBULATORY_CARE_PROVIDER_SITE_OTHER): Payer: Medicare Other | Admitting: Family Medicine

## 2016-07-23 VITALS — BP 130/68 | HR 96 | Temp 98.1°F | Wt 245.8 lb

## 2016-07-23 DIAGNOSIS — M549 Dorsalgia, unspecified: Secondary | ICD-10-CM

## 2016-07-23 DIAGNOSIS — R21 Rash and other nonspecific skin eruption: Secondary | ICD-10-CM | POA: Diagnosis not present

## 2016-07-23 DIAGNOSIS — M899 Disorder of bone, unspecified: Secondary | ICD-10-CM | POA: Diagnosis not present

## 2016-07-23 DIAGNOSIS — M25551 Pain in right hip: Secondary | ICD-10-CM

## 2016-07-23 MED ORDER — TRAZODONE HCL 100 MG PO TABS: 200.0000 mg | ORAL_TABLET | Freq: Every day | ORAL | 0 refills | Status: DC

## 2016-07-23 MED ORDER — HYDROCORTISONE 0.5 % EX CREA: 1.0000 "application " | TOPICAL_CREAM | Freq: Two times a day (BID) | CUTANEOUS | 0 refills | Status: DC

## 2016-07-23 MED ORDER — MELOXICAM 15 MG PO TABS: 15.0000 mg | ORAL_TABLET | Freq: Every day | ORAL | 0 refills | Status: DC

## 2016-07-23 NOTE — Patient Instructions (Addendum)
I have prescribed Mobic, take it once daily for the next 1 week, then daily as needed after that. Do no take Ibuprofen, Advil, Aleve, diclofenac, or Motrin while taking this medication.  I will call you when I get the results of your xray.
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
## 2016-07-26 NOTE — Assessment & Plan Note (Signed)
Significant point tenderness at right pubic ramus. Could be strain, however given h/o pelvic fracture in past, consider stress fracture. - pelvic x-ray - Mobic daily x 7 days, then as needed daily after that. - return precautions discussed.

## 2016-08-04 DIAGNOSIS — T84498A Other mechanical complication of other internal orthopedic devices, implants and grafts, initial encounter: Secondary | ICD-10-CM | POA: Diagnosis not present

## 2016-08-04 DIAGNOSIS — M216X2 Other acquired deformities of left foot: Secondary | ICD-10-CM | POA: Diagnosis not present

## 2016-08-04 DIAGNOSIS — M7989 Other specified soft tissue disorders: Secondary | ICD-10-CM | POA: Diagnosis not present

## 2016-08-04 DIAGNOSIS — M216X1 Other acquired deformities of right foot: Secondary | ICD-10-CM | POA: Diagnosis not present

## 2016-08-11 ENCOUNTER — Telehealth: Payer: Self-pay | Admitting: Family Medicine

## 2016-08-11 ENCOUNTER — Ambulatory Visit
Admission: RE | Admit: 2016-08-11 | Discharge: 2016-08-11 | Disposition: A | Discharge status: Home / Self Care | Payer: Medicare Other | Source: Ambulatory Visit | Attending: Family Medicine | Admitting: Family Medicine

## 2016-08-11 DIAGNOSIS — M25551 Pain in right hip: Secondary | ICD-10-CM

## 2016-08-11 DIAGNOSIS — M47816 Spondylosis without myelopathy or radiculopathy, lumbar region: Secondary | ICD-10-CM | POA: Diagnosis not present

## 2016-08-11 NOTE — Telephone Encounter (Signed)
Please call and let the patient know that the x-ray of her pelvis was normal.  Thanks, [name], MD [facility] Family Medicine Resident  08/11/2016, 4:35 PM

## 2016-08-12 ENCOUNTER — Telehealth: Payer: Self-pay

## 2016-08-12 NOTE — Telephone Encounter (Signed)
Error

## 2016-08-12 NOTE — Telephone Encounter (Signed)
Pt called office. Pt informed of info below. Ottis Stain, CMA

## 2016-08-12 NOTE — Telephone Encounter (Signed)
Left message on voicemail for patient to call back. 

## 2016-08-17 ENCOUNTER — Telehealth: Payer: Self-pay | Admitting: Family Medicine

## 2016-08-17 NOTE — Telephone Encounter (Signed)
Pt would like PCP to order her a walker with a seat. Pt states she is falling if she walks too much. ep

## 2016-08-19 DIAGNOSIS — I10 Essential (primary) hypertension: Secondary | ICD-10-CM | POA: Diagnosis not present

## 2016-08-19 DIAGNOSIS — M5136 Other intervertebral disc degeneration, lumbar region: Secondary | ICD-10-CM | POA: Diagnosis not present

## 2016-08-19 DIAGNOSIS — M5416 Radiculopathy, lumbar region: Secondary | ICD-10-CM | POA: Diagnosis not present

## 2016-08-19 DIAGNOSIS — M545 Low back pain: Secondary | ICD-10-CM | POA: Diagnosis not present

## 2016-08-19 NOTE — Telephone Encounter (Signed)
I am concerned patient is falling while walking. I will need to evaluate her in order to prescribe any equipment. [facility] team please call patient and have her schedule an appointment to be seen for this. Thank you.   Smitty Cords, MD [place], PGY-2

## 2016-08-25 ENCOUNTER — Telehealth: Payer: Self-pay | Admitting: Family Medicine

## 2016-08-25 NOTE — Telephone Encounter (Signed)
Rescheduled canceled AWV in Feb 2018 to 09/07/16. - Norma [name]

## 2016-08-26 ENCOUNTER — Ambulatory Visit (INDEPENDENT_AMBULATORY_CARE_PROVIDER_SITE_OTHER): Payer: Medicare Other | Admitting: Family Medicine

## 2016-08-26 ENCOUNTER — Encounter: Payer: Self-pay | Admitting: Family Medicine

## 2016-08-26 VITALS — BP 110/64 | HR 91 | Temp 98.2°F | Ht 68.0 in | Wt 244.2 lb

## 2016-08-26 DIAGNOSIS — R200 Anesthesia of skin: Secondary | ICD-10-CM

## 2016-08-26 DIAGNOSIS — R29898 Other symptoms and signs involving the musculoskeletal system: Secondary | ICD-10-CM | POA: Diagnosis not present

## 2016-08-26 DIAGNOSIS — R739 Hyperglycemia, unspecified: Secondary | ICD-10-CM | POA: Diagnosis not present

## 2016-08-26 DIAGNOSIS — R42 Dizziness and giddiness: Secondary | ICD-10-CM | POA: Diagnosis not present

## 2016-08-26 LAB — POCT GLYCOSYLATED HEMOGLOBIN (HGB A1C): Hemoglobin A1C: 5.8

## 2016-08-26 NOTE — Progress Notes (Signed)
Subjective:    Patient ID: Norma [name] , female   DOB: [birth year] , 56 y.o..   MRN: [id number]  HPI  Norma [name] is a 56 yo female with PMH chronic low back and pelvic bone pain here for  Chief Complaint  Patient presents with  . Follow-up   1. Dizziness and falls: Patient is coming in today because she has been falling and wants a walker with a seat but she was told that she needed to be evaluated first. Patient notes that for the last couple years she has had some intermittent falls. She notes that she falls sometimes when she bends over. She notes that before she falls she feels dizzy, sometimes as if the room is spinning. When she falls she never hits her head or loses consciousness. Never blacks out. Patient notes that She gets most dizzy when she stands up quickly from a sitting or laying position. Typically before she stands up she will sit on the side of the bed. Patient also notes that she has "weak knees" and peripheral neuropathy. Of note, patient recently seen in our clinic for pelvic pain and pelvic XRAY showed no abnormalities. XRAY did show mild degenerative changes at L5-S1 facet joints.   Review of Systems: Per HPI.   Past Medical History: Patient Active Problem List   Diagnosis Date Noted  . Weakness of both legs 08/31/2016  . Paronychia 11/05/2015  . Rash and nonspecific skin eruption 10/09/2015  . Laceration of left upper arm 04/22/2015  . Health care maintenance 10/10/2014  . Proteinuria 09/17/2014  . Flank pain, chronic 08/27/2014  . Intercostal muscle pain 08/02/2014  . Other cyst of bone, right ankle and foot 06/21/2014  . Lumbar herniated disc 06/06/2014  . Depression   . Spinal stenosis, lumbar region, with neurogenic claudication 11/21/2013  . Dizziness 05/08/2013  . Hyperglycemia 08/15/2012  . Low back pain radiating to left leg 07/17/2012  . Somatic dysfunction - Osteopathic Findings 07/02/2012  . Rotator cuff tear, left 05/05/2012  . Decreased  hearing 12/29/2011  . Pubic bone pain 07/08/2011  . Eczema, dyshidrotic 06/02/2010  . FIBROIDS, UTERUS 10/24/2009  . GERD 06/09/2009  . Chronic pain syndrome 10/04/2008  . HIP PAIN, LEFT, CHRONIC 08/16/2007  . Hepatitis C carrier ([place]) 03/21/2007  . Obesity 06/16/2006  . DEPRESSION, MAJOR, RECURRENT 06/16/2006  . HYPERTENSION, BENIGN SYSTEMIC 06/16/2006   Social Hx:  reports that she has never smoked. She has never used smokeless tobacco.   Objective:   BP 110/64   Pulse 91   Temp 98.2 F (36.8 C) (Oral)   Ht 5\' 8"  (1.727 m)   Wt 244 lb 3.2 oz (110.8 kg)   SpO2 95%   BMI 37.13 kg/m    No data found.  Physical Exam  Gen: NAD, alert, cooperative with exam, well-appearing HEENT: NCAT, PERRL, clear conjunctiva, oropharynx clear, supple neck Cardiac: Regular rate and rhythm, normal S1/S2, no murmur, no edema, capillary refill brisk  Respiratory: Clear to auscultation bilaterally, no wheezes, non-labored breathing Skin: no rashes, normal turgor  Neurological: Alert and oriented x 3, CN 2-12 intact, 5/5 grip strength bilaterally,4/5 lower extremity strength, sensation grossly intact throughout  Psych: good insight, normal mood and affect  Assessment & Plan:  Weakness of both legs Patient has 4/5 strength in bilateral legs. Does not appear to have any other neurological deficits. I suspect this is from deconditioning from decreased ambulation. Patient notes she is not as active as she used to be  secondary to arthritic pain.  - Referral to PT  Dizziness Initially thought to be from orthostatic hypotension as her symptoms seemed to align with that, however, the orthostatic vitals are normal. Could also be from vertigo vs dehydration vs deconditioning. Most concerning is patient states that she has been falling at times when she bends over and feels dizzy. No syncopal episodes though. No neurological deficits on exam with exception of mild leg weakness (4/5).  - Referral to PT -  Instructed patient to keep well hydrated - Follow up if symptoms worsen or persist over next couple months - CBC, BMP, hgb A1C - Can consider meclizine in future - Rx for walker with seat per patient preference to avoid any falling  Orders Placed This Encounter  Procedures  . For home use only DME Other see comment    Walker with seat  . CBC with Differential  . Basic metabolic panel  . Ambulatory referral to Physical Therapy    Referral Priority:   Routine    Referral Type:   Physical Medicine    Referral Reason:   Specialty Services Required    Requested Specialty:   Physical Therapy    Number of Visits Requested:   1  . POCT glycosylated hemoglobin (Hb A1C)    Smitty Cords, MD [place], PGY-2

## 2016-08-26 NOTE — Patient Instructions (Signed)
Thank you for coming in today, it was so nice to see you! Today we talked about:    Falls: I think this may be due to your leg numbness or vertigo. Please keep well hydrated.   I have referred you to physical therapy  Please follow up in 1-2 months. You can schedule this appointment at the front desk before you leave or call the clinic.  Bring in all your medications or supplements to each appointment for review.   If we ordered any tests today, you will be notified via telephone of any abnormalities. If everything is normal you will get a letter in the mail.   If you have any questions or concerns, please do not hesitate to call the office at [phone]. You can also message me directly via MyChart.   Sincerely,  Smitty Cords, MD

## 2016-08-27 LAB — CBC WITH DIFFERENTIAL/PLATELET
BASOS ABS: 0 10*3/uL (ref 0.0–0.2)
Basos: 1 %
EOS (ABSOLUTE): 0.2 10*3/uL (ref 0.0–0.4)
Eos: 2 %
HEMOGLOBIN: 12 g/dL (ref 11.1–15.9)
Hematocrit: 37.4 % (ref 34.0–46.6)
Immature Grans (Abs): 0 10*3/uL (ref 0.0–0.1)
Immature Granulocytes: 0 %
LYMPHS ABS: 3.3 10*3/uL — AB (ref 0.7–3.1)
Lymphs: 45 %
MCH: 26.9 pg (ref 26.6–33.0)
MCHC: 32.1 g/dL (ref 31.5–35.7)
MCV: 84 fL (ref 79–97)
MONOCYTES: 8 %
MONOS ABS: 0.6 10*3/uL (ref 0.1–0.9)
NEUTROS ABS: 3.2 10*3/uL (ref 1.4–7.0)
Neutrophils: 44 %
PLATELETS: 216 10*3/uL (ref 150–379)
RBC: 4.46 x10E6/uL (ref 3.77–5.28)
RDW: 16.1 % — ABNORMAL HIGH (ref 12.3–15.4)
WBC: 7.3 10*3/uL (ref 3.4–10.8)

## 2016-08-27 LAB — BASIC METABOLIC PANEL
BUN / CREAT RATIO: 10 (ref 9–23)
BUN: 10 mg/dL (ref 6–24)
CO2: 23 mmol/L (ref 18–29)
Calcium: 9.2 mg/dL (ref 8.7–10.2)
Chloride: 104 mmol/L (ref 96–106)
Creatinine, Ser: 0.98 mg/dL (ref 0.57–1.00)
GFR calc Af Amer: 74 mL/min/{1.73_m2} (ref >59)
GFR calc non Af Amer: 64 mL/min/{1.73_m2} (ref >59)
GLUCOSE: 78 mg/dL (ref 65–99)
POTASSIUM: 4.3 mmol/L (ref 3.5–5.2)
SODIUM: 141 mmol/L (ref 134–144)

## 2016-08-31 DIAGNOSIS — R29898 Other symptoms and signs involving the musculoskeletal system: Secondary | ICD-10-CM | POA: Insufficient documentation

## 2016-08-31 NOTE — Assessment & Plan Note (Signed)
Patient has 4/5 strength in bilateral legs. Does not appear to have any other neurological deficits. I suspect this is from deconditioning from decreased ambulation. Patient notes she is not as active as she used to be secondary to arthritic pain.  - Referral to PT

## 2016-08-31 NOTE — Assessment & Plan Note (Addendum)
Initially thought to be from orthostatic hypotension as her symptoms seemed to align with that, however, the orthostatic vitals are normal. Could also be from vertigo vs dehydration vs deconditioning. Most concerning is patient states that she has been falling at times when she bends over and feels dizzy. No syncopal episodes though. No neurological deficits on exam with exception of mild leg weakness (4/5). Not on any narcotics.  - Referral to PT - Instructed patient to keep well hydrated - Follow up if symptoms worsen or persist over next couple months - CBC, BMP, hgb A1C - Can consider meclizine in future - Rx for walker with seat per patient preference to avoid any falling

## 2016-09-02 DIAGNOSIS — T8189XD Other complications of procedures, not elsewhere classified, subsequent encounter: Secondary | ICD-10-CM | POA: Diagnosis not present

## 2016-09-02 DIAGNOSIS — M722 Plantar fascial fibromatosis: Secondary | ICD-10-CM | POA: Diagnosis not present

## 2016-09-07 ENCOUNTER — Ambulatory Visit: Payer: Medicare Other | Admitting: *Deleted

## 2016-09-09 ENCOUNTER — Encounter: Payer: Self-pay | Admitting: *Deleted

## 2016-09-09 ENCOUNTER — Other Ambulatory Visit: Payer: Self-pay | Admitting: Internal Medicine

## 2016-09-09 ENCOUNTER — Other Ambulatory Visit: Payer: Self-pay | Admitting: *Deleted

## 2016-09-09 ENCOUNTER — Ambulatory Visit (INDEPENDENT_AMBULATORY_CARE_PROVIDER_SITE_OTHER): Payer: Medicare Other | Admitting: *Deleted

## 2016-09-09 VITALS — BP 136/84 | HR 86 | Temp 98.7°F | Ht 68.0 in | Wt 246.2 lb

## 2016-09-09 DIAGNOSIS — R21 Rash and other nonspecific skin eruption: Secondary | ICD-10-CM

## 2016-09-09 DIAGNOSIS — Z0000 Encounter for general adult medical examination without abnormal findings: Secondary | ICD-10-CM

## 2016-09-09 DIAGNOSIS — G894 Chronic pain syndrome: Secondary | ICD-10-CM

## 2016-09-09 MED ORDER — DESONIDE 0.05 % EX CREA: TOPICAL_CREAM | Freq: Two times a day (BID) | CUTANEOUS | 0 refills | Status: DC

## 2016-09-09 MED ORDER — DICLOFENAC SODIUM 1 % TD GEL: 2.0000 g | Freq: Four times a day (QID) | TRANSDERMAL | 3 refills | Status: DC

## 2016-09-09 MED ORDER — EPINEPHRINE 0.3 MG/0.3ML IJ SOAJ: 0.3000 mg | Freq: Once | INTRAMUSCULAR | 1 refills | Status: AC

## 2016-09-09 NOTE — Telephone Encounter (Signed)
Patient requests 90 day supplies of all future refills to save money. Hubbard Hartshorn, RN, BSN

## 2016-09-09 NOTE — Progress Notes (Signed)
Subjective:   Norma [name] is a 56 y.o. female who presents for an Initial Medicare Annual Wellness Visit.   Cardiac Risk Factors include: hypertension;obesity (BMI >30kg/m2);sedentary lifestyle     Objective:    Today's Vitals   09/09/16 1353 09/09/16 1355  BP:  136/84  Pulse:  86  Temp:  98.7 F (37.1 C)  TempSrc:  Oral  SpO2:  99%  Weight: 246 lb 3.2 oz (111.7 kg)   Height: 5\' 8"  (1.727 m)   PainSc: 8  8   PainLoc: Foot    Body mass index is 37.43 kg/m.   Current Medications (verified) Outpatient Encounter Prescriptions as of 09/09/2016  Medication Sig  . ARIPiprazole (ABILIFY) 2 MG tablet Take 2 mg by mouth daily.  . beta carotene w/minerals (OCUVITE) tablet Take 1 tablet by mouth daily.  Marland Kitchen buPROPion (WELLBUTRIN XL) 300 MG 24 hr tablet Take 300 mg by mouth daily.  . cetirizine (ZYRTEC) 5 MG tablet Take 1 tablet (5 mg total) by mouth daily.  . cyclobenzaprine (FLEXERIL) 10 MG tablet Take 1 tablet (10 mg total) by mouth 3 (three) times daily as needed for muscle spasms.  Marland Kitchen desonide (DESOWEN) 0.05 % cream Apply topically 2 (two) times daily. Can use for 7-10 days.  . diclofenac sodium (VOLTAREN) 1 % GEL Apply 2 g topically 4 (four) times daily.  Marland Kitchen diltiazem (CARDIZEM SR) 60 MG 12 hr capsule Take 1 capsule (60 mg total) by mouth 2 (two) times daily.  Marland Kitchen EPINEPHrine (EPIPEN) 0.3 mg/0.3 mL SOAJ Inject 0.3 mLs (0.3 mg total) into the muscle once.  . fish oil-omega-3 fatty acids 1000 MG capsule Take 2 g by mouth daily.  . hydrochlorothiazide (HYDRODIURIL) 25 MG tablet Take 1 tablet (25 mg total) by mouth daily.  Marland Kitchen HYDROcodone-acetaminophen (NORCO/VICODIN) 5-325 MG per tablet Take 1 tablet by mouth every 6 (six) hours as needed for moderate pain.   . hydrocortisone cream 0.5 % Apply 1 application topically 2 (two) times daily.  . hydrOXYzine (ATARAX/VISTARIL) 10 MG tablet Take 10 mg by mouth 3 (three) times daily as needed for itching. Reported on 08/26/2015  . isosorbide  mononitrate (IMDUR) 30 MG 24 hr tablet Take 30 mg by mouth daily.  . meloxicam (MOBIC) 15 MG tablet Take 1 tablet (15 mg total) by mouth daily. For 7 days, then daily as needed for pain  . metoprolol tartrate (LOPRESSOR) 25 MG tablet Take 1 tablet (25 mg total) by mouth 2 (two) times daily.  Marland Kitchen omeprazole (PRILOSEC) 20 MG capsule take 1 capsule by mouth once daily  . pregabalin (LYRICA) 100 MG capsule Take 1 capsule (100 mg total) by mouth 2 (two) times daily.  . traZODone (DESYREL) 100 MG tablet Take 2 tablets (200 mg total) by mouth at bedtime.  . [DISCONTINUED] doxycycline (MONODOX) 100 MG capsule Take 1 capsule (100 mg total) by mouth 2 (two) times daily. (Patient not taking: Reported on 12/18/2015)  . [DISCONTINUED] hydrOXYzine (VISTARIL) 25 MG capsule take 1 capsule by mouth every 6 hours if needed (Patient not taking: Reported on 09/09/2016)  . [DISCONTINUED] ketoconazole (NIZORAL) 2 % shampoo Apply 1 application topically 2 (two) times a week. (Patient not taking: Reported on 12/18/2015)  . [DISCONTINUED] meloxicam (MOBIC) 15 MG tablet Take 1 tablet (15 mg total) by mouth daily. (Patient not taking: Reported on 08/26/2015)   No facility-administered encounter medications on file as of 09/09/2016.     Allergies (verified) Soma [carisoprodol]; Nortriptyline; and Prednisone   History: Past Medical  History:  Diagnosis Date  . Acid reflux   . Anxiety   . Arthritis   . Back pain   . Depression   . Diabetes mellitus without complication ([place])    on meds  . Headache    stress headaches, migraines at times  . Hypertension   . Irregular heart beat   . Left shoulder pain   . Shortness of breath dyspnea    with exertion  . Tachyarrhythmia   . Trigger point of thoracic region 03/22/2012   Past Surgical History:  Procedure Laterality Date  . ANKLE SURGERY    . LUMBAR LAMINECTOMY/DECOMPRESSION MICRODISCECTOMY Left 06/06/2014   Procedure: LUMBAR LAMINECTOMY/DECOMPRESSION MICRODISCECTOMY 1  LEVEL;  Surgeon: Newman Pies, MD;  Location: [facility];  Service: Neurosurgery;  Laterality: Left;  Left L5S1 microdiskectomy  . ROTATOR CUFF REPAIR Left 02/20/14  . TUBAL LIGATION     Family History  Problem Relation Age of Onset  . Diabetes Brother   . Diabetes Maternal Aunt   . Diabetes Paternal Aunt   . Diabetes Maternal Grandmother   . Diabetes Maternal Grandfather   . Diabetes Paternal Grandmother   . Diabetes Paternal Grandfather   . Congestive Heart Failure Mother   . Hypertension Son    Social History   Occupational History  . Not on file.   Social History Main Topics  . Smoking status: Never Smoker  . Smokeless tobacco: Never Used  . Alcohol use Yes     Comment: glass of wine occasionally  . Drug use: No     Comment: States no longer uses marijuana  . Sexual activity: Yes    Birth control/ protection: Condom    Tobacco Counseling Counseling given: Yes Patient has never smoked and has no plans to start.   Activities of Daily Living In your present state of health, do you have any difficulty performing the following activities: 09/09/2016  Hearing? Y  Vision? Y  Difficulty concentrating or making decisions? Y  Walking or climbing stairs? Y  Dressing or bathing? N  Doing errands, shopping? N  Preparing Food and eating ? N  Using the Toilet? N  In the past six months, have you accidently leaked urine? Y  Do you have problems with loss of bowel control? N  Managing your Medications? N  Managing your Finances? N  Housekeeping or managing your Housekeeping? Y  Some recent data might be hidden   Home Safety:  My home has a working smoke alarm:  Yes X 1           My home throw rugs have been fastened down to the floor or removed:  Removed I have a non-slip surface or non-slip mats in the bathtub and shower:  No, discussed purchasing non-slip mat        All my home's stairs have handrails, including any outdoor stairs  One level home with 2 outside steps  with handrails          My home's floors, stairs and hallways are free from clutter, wires and cords:  Yes     I have animals in my home  Yes, 2 dogs I wear seatbelts consistently:  Yes   Immunizations and Health Maintenance Immunization History  Administered Date(s) Administered  . Influenza Split 01/22/2011, 01/31/2012  . Influenza Whole 02/07/2007, 01/19/2008, 02/07/2009, 01/19/2010  . Influenza,inj,Quad PF,36+ Mos 01/10/2013, 01/14/2014, 03/06/2015  . PPD Test 06/02/2010, 06/09/2010, 06/16/2011, 12/04/2012  . Td 01/17/2002  . Tdap 04/13/2015   Health  Maintenance Due  Topic Date Due  . PNEUMOCOCCAL POLYSACCHARIDE VACCINE (1) 11/29/1960  . FOOT EXAM  11/29/1968  . OPHTHALMOLOGY EXAM  11/29/1968  . URINE MICROALBUMIN  11/29/1968  . COLONOSCOPY  11/29/2008  Patient has Cologuard and FIT at home. Will do one or the other.  Patient Care Team: [name], MD as PCP - General (Family Medicine) Inocencio Homes, DPM as Consulting Physician (Podiatry) Dr. Doylene Canard for Cardiology [facility] Dr. Cathie Olden for Dentistry  Indicate any recent Medical Services you may have received from other than [facility] providers in the past year (date may be approximate).     Assessment:   This is a routine wellness examination for Norma [name].   Hearing/Vision screen  Hearing Screening   Method: Audiometry   125Hz  250Hz  500Hz  1000Hz  2000Hz  3000Hz  4000Hz  6000Hz  8000Hz   Right ear:   25 25 25   Fail    Left ear:   25 25 25  25       Dietary issues and exercise activities discussed: Current Exercise Habits: Home exercise routine (Silver Sneakers at [facility]), Type of exercise: walking, Time (Minutes): 20, Frequency (Times/Week): 3, Weekly Exercise (Minutes/Week): 60, Intensity: Mild, Exercise limited by: orthopedic condition(s);neurologic condition(s)  Goals    . exercise          4 days per week 30 minutes each time    . Weight (lb) < 228 lb (103.4 kg) (pt-stated)          7% weight loss       Discussed recording consumption intake to assist with desired 7% weight loss. Recommended MyPLate or My Fitness Pal apps to assist with recording. Discussed engaging in physical activity 150 min/week  Depression Screen PHQ 2/9 Scores 09/09/2016 08/26/2016 07/23/2016 06/10/2016 12/18/2015 11/05/2015 10/09/2015  PHQ - 2 Score 2 0 0 0 0 0 0  PHQ- 9 Score 6 - - - - - -  Exception Documentation - - - - - - -  Not completed - - - - - - -   Patient goes to  [facility] every 3 months for counseling  Fall Risk Fall Risk  09/09/2016 08/26/2016 07/23/2016 08/26/2015 11/27/2014  Falls in the past year? Yes Yes No No Yes  Number falls in past yr: 2 or more - - - 2 or more  Injury with Fall? No No - - No  Risk Factor Category  High Fall Risk - - - High Fall Risk  Risk for fall due to : History of fall(s);Impaired mobility;Impaired balance/gait - - - -  Risk for fall due to (comments): - - - - -  Follow up Falls evaluation completed;Education provided;Falls prevention discussed - - - -   TUG Test:  Done in 12 seconds. Falls prevention discussed in detail and literature given.  Cognitive Function: Mini-Cog  Passed with score 3/5  Screening Tests Health Maintenance  Topic Date Due  . PNEUMOCOCCAL POLYSACCHARIDE VACCINE (1) 11/29/1960  . FOOT EXAM  11/29/1968  . OPHTHALMOLOGY EXAM  11/29/1968  . URINE MICROALBUMIN  11/29/1968  . COLONOSCOPY  11/29/2008  . INFLUENZA VACCINE  11/17/2016  . HEMOGLOBIN A1C  02/26/2017  . MAMMOGRAM  09/17/2017  . PAP SMEAR  10/09/2017  . TETANUS/TDAP  04/12/2025  . Hepatitis C Screening  Completed  . HIV Screening  Completed      Plan:     Patient currently having difficulty with housing. Has found mold in home she rents and landlord has refused to address this issue and asked her to  move within 60 days. Will forward to in-house LCSW for housing resources.  Patient is scheduled 09/28/2016 for right ankle surgery to remove screws 2/2 cyst on ankle.    I have  personally reviewed and noted the following in the patient's chart:   . Medical and social history . Use of alcohol, tobacco or illicit drugs  . Current medications and supplements . Functional ability and status . Nutritional status . Physical activity . Advanced directives . List of other physicians . Hospitalizations, surgeries, and ER visits in previous 12 months . Vitals . Screenings to include cognitive, depression, and falls . Referrals and appointments  In addition, I have reviewed and discussed with patient certain preventive protocols, quality metrics, and best practice recommendations. A written personalized care plan for preventive services as well as general preventive health recommendations were provided to patient.     Velora Heckler, RN   09/09/2016

## 2016-09-09 NOTE — Patient Instructions (Addendum)
Norma [name], Thank you for taking time to come for yourMedicare Wellness Visit. I appreciate your ongoing commitment to your health goals. Please review the following plan we discussed and let me know if I can assist you in the future.   These are the goals we discussed:  Goals    . exercise          4 days per week 30 minutes each time    . Weight (lb) < 228 lb (103.4 kg) (pt-stated)          7% weight loss         Fall Prevention in the Home Falls can cause injuries. They can happen to people of all ages. There are many things you can do to make your home safe and to help prevent falls. What can I do on the outside of my home?  Regularly fix the edges of walkways and driveways and fix any cracks.  Remove anything that might make you trip as you walk through a door, such as a raised step or threshold.  Trim any bushes or trees on the path to your home.  Use bright outdoor lighting.  Clear any walking paths of anything that might make someone trip, such as rocks or tools.  Regularly check to see if handrails are loose or broken. Make sure that both sides of any steps have handrails.  Any raised decks and porches should have guardrails on the edges.  Have any leaves, snow, or ice cleared regularly.  Use sand or salt on walking paths during winter.  Clean up any spills in your garage right away. This includes oil or grease spills. What can I do in the bathroom?  Use night lights.  Install grab bars by the toilet and in the tub and shower. Do not use towel bars as grab bars.  Use non-skid mats or decals in the tub or shower.  If you need to sit down in the shower, use a plastic, non-slip stool.  Keep the floor dry. Clean up any water that spills on the floor as soon as it happens.  Remove soap buildup in the tub or shower regularly.  Attach bath mats securely with double-sided non-slip rug tape.  Do not have throw rugs and other things on the floor that can  make you trip. What can I do in the bedroom?  Use night lights.  Make sure that you have a light by your bed that is easy to reach.  Do not use any sheets or blankets that are too big for your bed. They should not hang down onto the floor.  Have a firm chair that has side arms. You can use this for support while you get dressed.  Do not have throw rugs and other things on the floor that can make you trip. What can I do in the kitchen?  Clean up any spills right away.  Avoid walking on wet floors.  Keep items that you use a lot in easy-to-reach places.  If you need to reach something above you, use a strong step stool that has a grab bar.  Keep electrical cords out of the way.  Do not use floor polish or wax that makes floors slippery. If you must use wax, use non-skid floor wax.  Do not have throw rugs and other things on the floor that can make you trip. What can I do with my stairs?  Do not leave any items on the stairs.  Make  sure that there are handrails on both sides of the stairs and use them. Fix handrails that are broken or loose. Make sure that handrails are as long as the stairways.  Check any carpeting to make sure that it is firmly attached to the stairs. Fix any carpet that is loose or worn.  Avoid having throw rugs at the top or bottom of the stairs. If you do have throw rugs, attach them to the floor with carpet tape.  Make sure that you have a light switch at the top of the stairs and the bottom of the stairs. If you do not have them, ask someone to add them for you. What else can I do to help prevent falls?  Wear shoes that:  Do not have high heels.  Have rubber bottoms.  Are comfortable and fit you well.  Are closed at the toe. Do not wear sandals.  If you use a stepladder:  Make sure that it is fully opened. Do not climb a closed stepladder.  Make sure that both sides of the stepladder are locked into place.  Ask someone to hold it for you,  if possible.  Clearly mark and make sure that you can see:  Any grab bars or handrails.  First and last steps.  Where the edge of each step is.  Use tools that help you move around (mobility aids) if they are needed. These include:  Canes.  Walkers.  Scooters.  Crutches.  Turn on the lights when you go into a dark area. Replace any light bulbs as soon as they burn out.  Set up your furniture so you have a clear path. Avoid moving your furniture around.  If any of your floors are uneven, fix them.  If there are any pets around you, be aware of where they are.  Review your medicines with your doctor. Some medicines can make you feel dizzy. This can increase your chance of falling. Ask your doctor what other things that you can do to help prevent falls. This information is not intended to replace advice given to you by your health care provider. Make sure you discuss any questions you have with your health care provider. Document Released: 01/30/2009 Document Revised: 09/11/2015 Document Reviewed: 05/10/2014 Elsevier Interactive Patient Education  2017 [place] Maintenance, Female Adopting a healthy lifestyle and getting preventive care can go a long way to promote health and wellness. Talk with your health care provider about what schedule of regular examinations is right for you. This is a good chance for you to check in with your provider about disease prevention and staying healthy. In between checkups, there are plenty of things you can do on your own. Experts have done a lot of research about which lifestyle changes and preventive measures are most likely to keep you healthy. Ask your health care provider for more information. Weight and diet Eat a healthy diet  Be sure to include plenty of vegetables, fruits, low-fat dairy products, and lean protein.  Do not eat a lot of foods high in solid fats, added sugars, or salt.  Get regular exercise. This is one  of the most important things you can do for your health.  Most adults should exercise for at least 150 minutes each week. The exercise should increase your heart rate and make you sweat (moderate-intensity exercise).  Most adults should also do strengthening exercises at least twice a week. This is in addition to the moderate-intensity exercise. Maintain a  healthy weight  Body mass index (BMI) is a measurement that can be used to identify possible weight problems. It estimates body fat based on height and weight. Your health care provider can help determine your BMI and help you achieve or maintain a healthy weight.  For females 73 years of age and older:  A BMI below 18.5 is considered underweight.  A BMI of 18.5 to 24.9 is normal.  A BMI of 25 to 29.9 is considered overweight.  A BMI of 30 and above is considered obese. Watch levels of cholesterol and blood lipids  You should start having your blood tested for lipids and cholesterol at 58 years of age, then have this test every 5 years.  You may need to have your cholesterol levels checked more often if:  Your lipid or cholesterol levels are high.  You are older than 57 years of age.  You are at high risk for heart disease. Cancer screening Lung Cancer  Lung cancer screening is recommended for adults 82-75 years old who are at high risk for lung cancer because of a history of smoking.  A yearly low-dose CT scan of the lungs is recommended for people who:  Currently smoke.  Have quit within the past 15 years.  Have at least a 30-pack-year history of smoking. A pack year is smoking an average of one pack of cigarettes a day for 1 year.  Yearly screening should continue until it has been 15 years since you quit.  Yearly screening should stop if you develop a health problem that would prevent you from having lung cancer treatment. Breast Cancer  Practice breast self-awareness. This means understanding how your breasts  normally appear and feel.  It also means doing regular breast self-exams. Let your health care provider know about any changes, no matter how small.  If you are in your 20s or 30s, you should have a clinical breast exam (CBE) by a health care provider every 1-3 years as part of a regular health exam.  If you are 57 or older, have a CBE every year. Also consider having a breast X-ray (mammogram) every year.  If you have a family history of breast cancer, talk to your health care provider about genetic screening.  If you are at high risk for breast cancer, talk to your health care provider about having an MRI and a mammogram every year.  Breast cancer gene (BRCA) assessment is recommended for women who have family members with BRCA-related cancers. BRCA-related cancers include:  Breast.  Ovarian.  Tubal.  Peritoneal cancers.  Results of the assessment will determine the need for genetic counseling and BRCA1 and BRCA2 testing. Cervical Cancer  Your health care provider may recommend that you be screened regularly for cancer of the pelvic organs (ovaries, uterus, and vagina). This screening involves a pelvic examination, including checking for microscopic changes to the surface of your cervix (Pap test). You may be encouraged to have this screening done every 3 years, beginning at age 59.  For women ages 48-65, health care providers may recommend pelvic exams and Pap testing every 3 years, or they may recommend the Pap and pelvic exam, combined with testing for human papilloma virus (HPV), every 5 years. Some types of HPV increase your risk of cervical cancer. Testing for HPV may also be done on women of any age with unclear Pap test results.  Other health care providers may not recommend any screening for nonpregnant women who are considered low risk for  pelvic cancer and who do not have symptoms. Ask your health care provider if a screening pelvic exam is right for you.  If you have had  past treatment for cervical cancer or a condition that could lead to cancer, you need Pap tests and screening for cancer for at least 20 years after your treatment. If Pap tests have been discontinued, your risk factors (such as having a new sexual partner) need to be reassessed to determine if screening should resume. Some women have medical problems that increase the chance of getting cervical cancer. In these cases, your health care provider may recommend more frequent screening and Pap tests. Colorectal Cancer  This type of cancer can be detected and often prevented.  Routine colorectal cancer screening usually begins at 58 years of age and continues through 58 years of age.  Your health care provider may recommend screening at an earlier age if you have risk factors for colon cancer.  Your health care provider may also recommend using home test kits to check for hidden blood in the stool.  A small camera at the end of a tube can be used to examine your colon directly (sigmoidoscopy or colonoscopy). This is done to check for the earliest forms of colorectal cancer.  Routine screening usually begins at age 21.  Direct examination of the colon should be repeated every 5-10 years through 58 years of age. However, you may need to be screened more often if early forms of precancerous polyps or small growths are found. Skin Cancer  Check your skin from head to toe regularly.  Tell your health care provider about any new moles or changes in moles, especially if there is a change in a mole's shape or color.  Also tell your health care provider if you have a mole that is larger than the size of a pencil eraser.  Always use sunscreen. Apply sunscreen liberally and repeatedly throughout the day.  Protect yourself by wearing long sleeves, pants, a wide-brimmed hat, and sunglasses whenever you are outside. Heart disease, diabetes, and high blood pressure  High blood pressure causes heart disease  and increases the risk of stroke. High blood pressure is more likely to develop in:  People who have blood pressure in the high end of the normal range (130-139/85-89 mm Hg).  People who are overweight or obese.  People who are African American.  If you are 62-62 years of age, have your blood pressure checked every 3-5 years. If you are 72 years of age or older, have your blood pressure checked every year. You should have your blood pressure measured twice-once when you are at a hospital or clinic, and once when you are not at a hospital or clinic. Record the average of the two measurements. To check your blood pressure when you are not at a hospital or clinic, you can use:  An automated blood pressure machine at a pharmacy.  A home blood pressure monitor.  If you are between 70 years and 20 years old, ask your health care provider if you should take aspirin to prevent strokes.  Have regular diabetes screenings. This involves taking a blood sample to check your fasting blood sugar level.  If you are at a normal weight and have a low risk for diabetes, have this test once every three years after 58 years of age.  If you are overweight and have a high risk for diabetes, consider being tested at a younger age or more often. Preventing infection  Hepatitis B  If you have a higher risk for hepatitis B, you should be screened for this virus. You are considered at high risk for hepatitis B if:  You were born in a country where hepatitis B is common. Ask your health care provider which countries are considered high risk.  Your parents were born in a high-risk country, and you have not been immunized against hepatitis B (hepatitis B vaccine).  You have HIV or AIDS.  You use needles to inject street drugs.  You live with someone who has hepatitis B.  You have had sex with someone who has hepatitis B.  You get hemodialysis treatment.  You take certain medicines for conditions, including  cancer, organ transplantation, and autoimmune conditions. Hepatitis C  Blood testing is recommended for:  Everyone born from [birth year] through 1967.  Anyone with known risk factors for hepatitis C. Sexually transmitted infections (STIs)  You should be screened for sexually transmitted infections (STIs) including gonorrhea and chlamydia if:  You are sexually active and are younger than 58 years of age.  You are older than 58 years of age and your health care provider tells you that you are at risk for this type of infection.  Your sexual activity has changed since you were last screened and you are at an increased risk for chlamydia or gonorrhea. Ask your health care provider if you are at risk.  If you do not have HIV, but are at risk, it may be recommended that you take a prescription medicine daily to prevent HIV infection. This is called pre-exposure prophylaxis (PrEP). You are considered at risk if:  You are sexually active and do not regularly use condoms or know the HIV status of your partner(s).  You take drugs by injection.  You are sexually active with a partner who has HIV. Talk with your health care provider about whether you are at high risk of being infected with HIV. If you choose to begin PrEP, you should first be tested for HIV. You should then be tested every 3 months for as long as you are taking PrEP. Pregnancy  If you are premenopausal and you may become pregnant, ask your health care provider about preconception counseling.  If you may become pregnant, take 400 to 800 micrograms (mcg) of folic acid every day.  If you want to prevent pregnancy, talk to your health care provider about birth control (contraception). Osteoporosis and menopause  Osteoporosis is a disease in which the bones lose minerals and strength with aging. This can result in serious bone fractures. Your risk for osteoporosis can be identified using a bone density scan.  If you are 43 years of age  or older, or if you are at risk for osteoporosis and fractures, ask your health care provider if you should be screened.  Ask your health care provider whether you should take a calcium or vitamin D supplement to lower your risk for osteoporosis.  Menopause may have certain physical symptoms and risks.  Hormone replacement therapy may reduce some of these symptoms and risks. Talk to your health care provider about whether hormone replacement therapy is right for you. Follow these instructions at home:  Schedule regular health, dental, and eye exams.  Stay current with your immunizations.  Do not use any tobacco products including cigarettes, chewing tobacco, or electronic cigarettes.  If you are pregnant, do not drink alcohol.  If you are breastfeeding, limit how much and how often you drink alcohol.  Limit alcohol  intake to no more than 1 drink per day for nonpregnant women. One drink equals 12 ounces of beer, 5 ounces of wine, or 1 ounces of hard liquor.  Do not use street drugs.  Do not share needles.  Ask your health care provider for help if you need support or information about quitting drugs.  Tell your health care provider if you often feel depressed.  Tell your health care provider if you have ever been abused or do not feel safe at home. This information is not intended to replace advice given to you by your health care provider. Make sure you discuss any questions you have with your health care provider. Document Released: 10/19/2010 Document Revised: 09/11/2015 Document Reviewed: 01/07/2015 Elsevier Interactive Patient Education  2017 [employer].

## 2016-09-10 ENCOUNTER — Telehealth: Payer: Self-pay | Admitting: Family Medicine

## 2016-09-10 NOTE — Telephone Encounter (Signed)
She cannot afford the desonide or volteran that was prescibed yesterday.  Could dr call in something cheaper? [employer] on [address]

## 2016-09-10 NOTE — Telephone Encounter (Signed)
Unfortunately there are no alternatives to voltaren gel. She can use over the counter cortisone cream instead of desonide. Please inform patient of this. Thank you.

## 2016-09-13 NOTE — Progress Notes (Signed)
addendum: I have reviewed this visit and discussed with Howell Rucks, RN, BSN, and agree with her documentation  Smitty Cords, MD [place], PGY-2

## 2016-09-14 ENCOUNTER — Telehealth: Payer: Self-pay | Admitting: Licensed Clinical Social Worker

## 2016-09-14 NOTE — Progress Notes (Signed)
Social Work consult from [facility], Therapist, sports.  Patient currently having difficulty with housing. Has found mold in home she rents and landlord has refused to address this issue and asked her to move by July 31st.  Called patient to assess needs ref. the above consult.  Patient received list from [facility] but did not like the property list provided. Patient has two large dogs and this has created limitations with housing.  LCSW reviewed various options with patient and provided phone numbers that may be able to assist her with locating affordable housing.  Patient appreciative of the call and resources.  Plan: 1. Patient will contact [employer] for a new housing list. 2. Patient will also contact [employer] Housing [employer]   Casimer Lanius, LCSW Licensed Clinical Social Worker [place]   [phone] 9:55 AM

## 2016-09-14 NOTE — Telephone Encounter (Signed)
LVM for pt to call back to inform her of below. If she calls back please give her the below message.  Katharina Caper, Ashauna Bertholf D, [place]

## 2016-09-20 NOTE — Telephone Encounter (Signed)
Pt informed. Zimmerman Rumple, Kamaree Wheatley D, CMA  

## 2016-10-01 ENCOUNTER — Other Ambulatory Visit: Payer: Self-pay | Admitting: Family Medicine

## 2016-10-11 ENCOUNTER — Other Ambulatory Visit: Payer: Self-pay | Admitting: Student

## 2016-10-18 ENCOUNTER — Ambulatory Visit (INDEPENDENT_AMBULATORY_CARE_PROVIDER_SITE_OTHER): Payer: Medicare Other | Admitting: Student

## 2016-10-18 ENCOUNTER — Encounter: Payer: Self-pay | Admitting: Student

## 2016-10-18 VITALS — BP 115/70 | HR 97 | Temp 98.1°F | Ht 68.0 in | Wt 250.4 lb

## 2016-10-18 DIAGNOSIS — M79642 Pain in left hand: Secondary | ICD-10-CM

## 2016-10-18 MED ORDER — MELOXICAM 15 MG PO TABS: ORAL_TABLET | ORAL | 0 refills | Status: DC

## 2016-10-18 NOTE — Progress Notes (Signed)
Subjective:    Norma [name] is a 56 y.o. old female here for left hand pain and swelling  HPI Left hand pain: going on for one week. She described the pain as sharp. Pain is mainly over her distal interphalangeal joints. Pain is intermittent, every 20 minutes and lasts about 5 minutes. Pain is mostly during the daytime. She also reports some swelling and numbness but no weakness. Denies history of trauma or injury. Admits history of neuropathy and osteoarthritis. She is right-handed. Denies fever, fatigue or unintentional weight loss. She is also on Lyrica for neuropathy. Hasn't tried other pain medication. She says she has been out of her Meloxicam.   PMH/Problem List: has FIBROIDS, UTERUS; Obesity; DEPRESSION, MAJOR, RECURRENT; Chronic pain syndrome; HYPERTENSION, BENIGN SYSTEMIC; GERD; HIP PAIN, LEFT, CHRONIC; Hepatitis C carrier ([place]); Eczema, dyshidrotic; Pubic bone pain; Decreased hearing; Left hand pain; Rotator cuff tear, left; Somatic dysfunction - Osteopathic Findings; Low back pain radiating to left leg; Hyperglycemia; Dizziness; Spinal stenosis, lumbar region, with neurogenic claudication; Depression; Lumbar herniated disc; Other cyst of bone, right ankle and foot; Intercostal muscle pain; Flank pain, chronic; Proteinuria; Health care maintenance; Laceration of left upper arm; Rash and nonspecific skin eruption; Paronychia; and Weakness of both legs on her problem list.   has a past medical history of Acid reflux; Anxiety; Arthritis; Back pain; Depression; Diabetes mellitus without complication ([place]); Headache; Hypertension; Irregular heart beat; Left shoulder pain; Shortness of breath dyspnea; Tachyarrhythmia; and Trigger point of thoracic region (03/22/2012).  FH:  Family History  Problem Relation Age of Onset  . Diabetes Brother   . Diabetes Maternal Aunt   . Diabetes Paternal Aunt   . Diabetes Maternal Grandmother   . Diabetes Maternal Grandfather   . Diabetes Paternal Grandmother     . Diabetes Paternal Grandfather   . Congestive Heart Failure Mother   . Hypertension Son     [facility] Social History  Substance Use Topics  . Smoking status: Never Smoker  . Smokeless tobacco: Never Used  . Alcohol use Yes     Comment: glass of wine occasionally    Review of Systems Review of systems negative except for pertinent positives and negatives in history of present illness above.     Objective:     Vitals:   10/18/16 1517  BP: 115/70  Pulse: 97  Temp: 98.1 F (36.7 C)  TempSrc: Oral  SpO2: 98%  Weight: 250 lb 6.4 oz (113.6 kg)  Height: 5\' 8"  (1.727 m)    Physical Exam GEN: appears well, no apparent distress. CVS: RRR, nl S1&S2, no murmurs, no edema RESP: speaks in full sentence, no IWOB MSK:  Right hand dominant. [place]den nodes in her right middle finger. No inflammation or apparent skin lesion, or tenderness of bony prominences. Full ROM in DIP, PIP, MCP, & carpal joints. Phalen's & Tinel's tests were negative. See under neuro and VCS section for neurovascular SKIN: no apparent skin lesion NEURO: alert and oiented appropriately, hand grip 5/5 in both hands & sensation intact in all dermatomes of upper extremity.  PSYCH: euthymic mood with congruent affect    Assessment and Plan:  Left hand pain Likely due to osteoarthritis. There could be some component of neuropathy as well. Exam with Heberden nodes over her right middle finger. Hand grip 5/5 and sensation intact in all dermatomes of upper extremities. CBC without macrocytosis to think about vitamin B12 deficiency. Her picture doesn't fit into carpal tunnel syndrome. No constitutional symptoms to think of inflammatory process. Refilled her  meloxicam. We'll continue her Lyrica. Suggested trying Aspercreme as needed.   No orders of the defined types were placed in this encounter.  Meds ordered this encounter  Medications  . meloxicam (MOBIC) 15 MG tablet    Sig: take 1 tablet by mouth once daily for 7 days  then if needed for pain    Dispense:  30 tablet    Refill:  0   Return if symptoms worsen or fail to improve.  Mercy Riding, MD 10/18/16 Pager: [phone]

## 2016-10-18 NOTE — Assessment & Plan Note (Addendum)
Likely due to osteoarthritis. There could be some component of neuropathy as well. Exam with Heberden nodes over her right middle finger. Hand grip 5/5 and sensation intact in all dermatomes of upper extremities. CBC without macrocytosis to think about vitamin B12 deficiency. Her picture doesn't fit into carpal tunnel syndrome. No constitutional symptoms to think of inflammatory process. Refilled her meloxicam. We'll continue her Lyrica. Suggested trying Aspercreme as needed.

## 2016-10-18 NOTE — Patient Instructions (Signed)
It was great seeing you today! We have addressed the following issues today 1. Left hand pain: this is likely due to osteoarthritis. They could be some component of neuropathy as well. I have renewed your prescription for meloxicam. Continue taking your Lyrica. I also recommend trying Aspercreme which is available over-the-counter.   If we did any lab work today, and the results require attention, either me or my nurse will get in touch with you. If everything is normal, you will get a letter in mail and a message via . If you don't hear from [place] in two weeks, please give [place] a call. Otherwise, we look forward to seeing you again at your next visit. If you have any questions or concerns before then, please call the clinic at [phone].  Please bring all your medications to every doctors visit  Sign up for My Chart to have easy access to your labs results, and communication with your Primary care physician.    Please check-out at the front desk before leaving the clinic.    Take Care,   Dr. Cyndia Skeeters

## 2016-10-29 ENCOUNTER — Ambulatory Visit: Payer: Medicare Other | Admitting: Family Medicine

## 2016-11-02 ENCOUNTER — Encounter: Payer: Self-pay | Admitting: Student in an Organized Health Care Education/Training Program

## 2016-11-02 ENCOUNTER — Ambulatory Visit (INDEPENDENT_AMBULATORY_CARE_PROVIDER_SITE_OTHER): Payer: Medicare Other | Admitting: Student in an Organized Health Care Education/Training Program

## 2016-11-02 DIAGNOSIS — Z9109 Other allergy status, other than to drugs and biological substances: Secondary | ICD-10-CM

## 2016-11-02 DIAGNOSIS — I10 Essential (primary) hypertension: Secondary | ICD-10-CM

## 2016-11-02 MED ORDER — HYDROCHLOROTHIAZIDE 25 MG PO TABS: 25.0000 mg | ORAL_TABLET | Freq: Every day | ORAL | 3 refills | Status: DC

## 2016-11-02 MED ORDER — CETIRIZINE HCL 5 MG PO TABS: 5.0000 mg | ORAL_TABLET | Freq: Every day | ORAL | 3 refills | Status: DC

## 2016-11-02 NOTE — Patient Instructions (Signed)
It was a pleasure seeing you today in our clinic.   - Please take the allergy medication I prescribed every day - I refilled your HCTZ - If you ever feel like you are having difficulty getting air in, seek medical attention immediately Our clinic's number is [phone]. Please call with questions or concerns about what we discussed today.  Be well, Dr. Burr Medico

## 2016-11-02 NOTE — Progress Notes (Signed)
   CC: sensation of something stuck in throat  HPI: Norma [name] is a 56 y.o. female.  Sensation of throat closing/something clogged in throat - onset 3 weeks, intermittent but daily - no palliating or relieving factors identified - notes she has mold growing in in her house that has also made her son sick - + associated itchy eyes - + associated nonproductive cough - no postnasal drip, rhinorrhea, sinus pain, sneezing, or congestion - no stomach pain, no chest pain - + seasonal allergies, takes antihistamine on and off, has taken 3x in last 3 weeks  Review of Symptoms:  See HPI for ROS.   CC, SH/smoking status, and VS noted.  Objective: BP 138/88   Pulse 93   Temp 98.2 F (36.8 C) (Oral)   Ht 5\' 8"  (1.727 m)   Wt 248 lb 9.6 oz (112.8 kg)   SpO2 95%   BMI 37.80 kg/m  GEN: NAD, alert, cooperative, and pleasant. EYE: no conunctival injection, pupils equally round and reactive to light ENMT: normal tympanic light reflex, no nasal polyps,no rhinorrhea, no pharyngeal erythema or exudates, tonsils are prominent but nonerythematos +cobblestoningu NECK: full ROM, no LAD RESPIRATORY: clear to auscultation bilaterally with no wheezes, rhonchi or rales, good effort CV: RRR, no m/r/g, no peripheral edema GI: soft, non-tender, non-distended, no hepatosplenomegaly SKIN: warm and dry, no rashes or lesions NEURO: II-XII grossly intact, normal gait, peripheral sensation intact PSYCH: AAOx3, appropriate affect  Assessment and plan:  Environmental allergies - allergy supported by supported by history of mold in the house, phys exam with cobblestoning of throat, hx of cough and difficulty swallowing x3 weeks  - other possibilities on ddx include viral syndrome, GERD (already takes omeprazole daily) - will trial daily antihistamine, prescribed - follow up as needed - return precautions advised (ie if difficulty breathing)  HTN - controlled - refilled HCTZ  Meds ordered this  encounter  Medications  . cetirizine (ZYRTEC) 5 MG tablet    Sig: Take 1 tablet (5 mg total) by mouth daily.    Dispense:  30 tablet    Refill:  3  . hydrochlorothiazide (HYDRODIURIL) 25 MG tablet    Sig: Take 1 tablet (25 mg total) by mouth daily.    Dispense:  90 tablet    Refill:  3    Everrett Coombe, MD,MS,  PGY2 11/03/2016 10:50 AM

## 2016-11-03 DIAGNOSIS — Z9109 Other allergy status, other than to drugs and biological substances: Secondary | ICD-10-CM | POA: Insufficient documentation

## 2016-11-03 NOTE — Assessment & Plan Note (Signed)
-   allergy supported by supported by history of mold in the house, phys exam with cobblestoning of throat, hx of cough and difficulty swallowing x3 weeks  - other possibilities on ddx include viral syndrome, GERD (already takes omeprazole daily) - will trial daily antihistamine, prescribed - follow up as needed - return precautions advised (ie if difficulty breathing)

## 2016-11-17 DIAGNOSIS — I10 Essential (primary) hypertension: Secondary | ICD-10-CM | POA: Diagnosis not present

## 2016-11-17 DIAGNOSIS — M5136 Other intervertebral disc degeneration, lumbar region: Secondary | ICD-10-CM | POA: Diagnosis not present

## 2016-11-17 DIAGNOSIS — M5416 Radiculopathy, lumbar region: Secondary | ICD-10-CM | POA: Diagnosis not present

## 2016-11-17 DIAGNOSIS — M545 Low back pain: Secondary | ICD-10-CM | POA: Diagnosis not present

## 2016-11-23 ENCOUNTER — Encounter: Payer: Self-pay | Admitting: Student

## 2016-11-23 ENCOUNTER — Ambulatory Visit (INDEPENDENT_AMBULATORY_CARE_PROVIDER_SITE_OTHER): Payer: Medicare Other | Admitting: Student

## 2016-11-23 VITALS — BP 136/78 | HR 95 | Temp 98.2°F | Ht 68.0 in | Wt 245.0 lb

## 2016-11-23 DIAGNOSIS — R05 Cough: Secondary | ICD-10-CM

## 2016-11-23 DIAGNOSIS — R059 Cough, unspecified: Secondary | ICD-10-CM

## 2016-11-23 MED ORDER — GUAIFENESIN-CODEINE 100-10 MG/5ML PO SYRP: 10.0000 mL | ORAL_SOLUTION | Freq: Three times a day (TID) | ORAL | 0 refills | Status: DC | PRN

## 2016-11-23 NOTE — Progress Notes (Signed)
Subjective:    Norma [name] is a 56 y.o. old female here SDA for cough  HPI Cough: for two weeks. Cough is dry. She also endorses postnasal dripping. She says she just moved out of her house with morphine. Cough is worse at night. Had some cold symptoms about 2 weeks ago. Has history of GERD and is taking Prilosec. Denies fever & shortness of breath. She says her diarrhea started when she coughs. Denies leg swelling, orthopnea or PND. She has tried multiple over-the-counter cough medications without improvement.  Overall, she thinks the cough is getting worse.   Patient has no history of asthma/COPD or heart disease.  PMH/Problem List: has FIBROIDS, UTERUS; Obesity; Chronic pain syndrome; HYPERTENSION, BENIGN SYSTEMIC; GERD; HIP PAIN, LEFT, CHRONIC; Hepatitis C carrier ([place]); Eczema, dyshidrotic; Decreased hearing; Rotator cuff tear, left; Somatic dysfunction - Osteopathic Findings; Spinal stenosis, lumbar region, with neurogenic claudication; Depression; Lumbar herniated disc; Other cyst of bone, right ankle and foot; Flank pain, chronic; Proteinuria; and Environmental allergies on her problem list.   has a past medical history of Acid reflux; Anxiety; Arthritis; Back pain; Depression; Diabetes mellitus without complication ([place]); Headache; Hypertension; Irregular heart beat; Left shoulder pain; Shortness of breath dyspnea; Tachyarrhythmia; and Trigger point of thoracic region (03/22/2012).  FH:  Family History  Problem Relation Age of Onset  . Diabetes Brother   . Diabetes Maternal Aunt   . Diabetes Paternal Aunt   . Diabetes Maternal Grandmother   . Diabetes Maternal Grandfather   . Diabetes Paternal Grandmother   . Diabetes Paternal Grandfather   . Congestive Heart Failure Mother   . Hypertension Son     [facility] Social History  Substance Use Topics  . Smoking status: Never Smoker  . Smokeless tobacco: Never Used  . Alcohol use Yes     Comment: glass of wine occasionally    Review of  Systems Review of systems negative except for pertinent positives and negatives in history of present illness above.     Objective:     Vitals:   11/23/16 1456  BP: 136/78  Pulse: 95  Temp: 98.2 F (36.8 C)  TempSrc: Oral  SpO2: 96%  Weight: 245 lb (111.1 kg)  Height: 5\' 8"  (1.727 m)    Physical Exam GEN: appears well, no apparent distress. Oropharynx: mmm without erythema or exudation. No cobblestoning HEM: negative for cervical or periauricular lymphadenopathies CVS: RRR, nl S1&S2, no murmurs, no edema.  RESP: no IWOB, good air movement bilaterally, CTAB MSK: no focal tenderness or notable swelling SKIN: no apparent skin lesion NEURO: alert and oiented appropriately, no gross deficits  PSYCH: euthymic mood with congruent affect    Assessment and Plan:  1. Cough: likely due to viral URI. House with mold and postnasal dripping concerning for a allergic rhinitis. She has no itchy eyes or cobblestoning on exam. Patient without chronic lung disease or CHF.  -Discussed conservative management including adequate hydration and a tablespoonful of honey before bedtime. Patient is not diabetic. -Also recommended Claritin or Zyrtec daily for possible allergy -GuaiFENesin-codeine (ROBITUSSIN AC) 100-10 MG/5ML syrup; Take 10 mLs by mouth 3 (three) times daily as needed for cough. May cause drowsiness or sedation!  Dispense: 120 mL; Refill: 0. Warned about sedation and drowsiness with this medication. I advised her not to take with her Norco. -Discussed return precautions in detail  Meds ordered this encounter  Medications  . guaiFENesin-codeine (ROBITUSSIN AC) 100-10 MG/5ML syrup    Sig: Take 10 mLs by mouth 3 (three) times daily  as needed for cough. May cause drowsiness or sedation!    Dispense:  120 mL    Refill:  0   Return if symptoms worsen or fail to improve.  Mercy Riding, MD 11/23/16 Pager: [phone]

## 2016-11-23 NOTE — Patient Instructions (Addendum)
It is nice to meet you again! Your cough is likely from common cold. Cough might take up to 6 weeks to resolve completely.   -We gave you a prescription for Cheratussin. Please don't take this medication with him Norco. This medicine could make her sleepy and drowsy as well. Please be cautious if you are driving. - A tablespoonful of honey before bedtime is helpful for cough.  - Try Claritin or Zyrtec which is available over-the-counter - Get plenty of rest and adequate hydration. - Consume warm fluids (soup or tea) to provide relief for a stuffy nose and to loosen phlegm. - For nasal stuffiness, try saline nasal spray or a Neti Pot. Eating warm liquids such as chicken soup or tea may also help with nasal congestion. - For sore throat pain relief: suck on throat lozenges, hard candy or popsicles; gargle with warm salt water (1/4 tsp. salt per 8 oz. of water); and eat soft, bland foods. - Eat a well-balanced diet. If you cannot, ensure you are getting enough nutrients by taking a daily multivitamin. - Avoid dairy products, as they can thicken phlegm. - Avoid alcohol, as it impairs your body's immune system.  CONTACT YOUR DOCTOR IF YOU EXPERIENCE ANY OF THE FOLLOWING: - High fever, chest pain, shortness of breath or  not able to keep down food or fluids.  - Cough that gets worse while other cold symptoms improve - Flare up of any chronic lung problem, such as asthma - Your symptoms persist longer than 2 weeks

## 2016-12-08 ENCOUNTER — Encounter: Payer: Self-pay | Admitting: Internal Medicine

## 2016-12-08 ENCOUNTER — Ambulatory Visit (INDEPENDENT_AMBULATORY_CARE_PROVIDER_SITE_OTHER): Payer: Medicare Other | Admitting: Internal Medicine

## 2016-12-08 VITALS — BP 154/78 | HR 96 | Temp 98.2°F | Wt 254.0 lb

## 2016-12-08 DIAGNOSIS — M25511 Pain in right shoulder: Secondary | ICD-10-CM | POA: Diagnosis not present

## 2016-12-08 MED ORDER — MELOXICAM 15 MG PO TABS: ORAL_TABLET | ORAL | 0 refills | Status: DC

## 2016-12-08 NOTE — Progress Notes (Signed)
   Subjective:    Norma [name] - 56 y.o. female MRN [id number]  Date of birth: [birth year]  HPI  Norma [name] is here for right shoulder pain.  Right Shoulder Pain: Patient reports right anteriolateral shoulder pain that started about 2-3 days ago. Describes pain as a dull, ache with loss of ROM and sensation of weakness. No injury to the area or traumatic event. She reports that she has very low level chronic shoulder pain and was told several years ago that she had a tear in her right rotator cuff. She has had surgery to the left rotator cuff in the recent past for a tear. Has been using Voltaren gel with some relief of her pain. Movement of the shoulder makes the pain worse.    -  reports that she has never smoked. She has never used smokeless tobacco. - Review of Systems: Per HPI. - Past Medical History: Patient Active Problem List   Diagnosis Date Noted  . Environmental allergies 11/03/2016  . Proteinuria 09/17/2014  . Flank pain, chronic 08/27/2014  . Other cyst of bone, right ankle and foot 06/21/2014  . Lumbar herniated disc 06/06/2014  . Depression   . Spinal stenosis, lumbar region, with neurogenic claudication 11/21/2013  . Somatic dysfunction - Osteopathic Findings 07/02/2012  . Rotator cuff tear, left 05/05/2012  . Decreased hearing 12/29/2011  . Eczema, dyshidrotic 06/02/2010  . FIBROIDS, UTERUS 10/24/2009  . GERD 06/09/2009  . Chronic pain syndrome 10/04/2008  . HIP PAIN, LEFT, CHRONIC 08/16/2007  . Hepatitis C carrier ([place]) 03/21/2007  . Obesity 06/16/2006  . HYPERTENSION, BENIGN SYSTEMIC 06/16/2006   - Medications: reviewed and updated   Objective:   Physical Exam BP (!) 154/78   Pulse 96   Temp 98.2 F (36.8 C) (Oral)   Wt 254 lb (115.2 kg)   SpO2 98%   BMI 38.62 kg/m  Gen: NAD, alert, cooperative with exam, well-appearing Shoulder: Diffuse TTP over the right shoulder but particularly over the [facility] joint. No erythema or edema noted. ROM is limited in  internal rotation of right shoulder compared to left. Forward flexion and abduction are intact but painful above 90 degrees on the right. External rotation intact and not painful. Strength preserved in all planes. Negative empty can test. Positive Hawkin's test.     Assessment & Plan:   1. Acute pain of right shoulder Suspect rotator cuff pathology given limited ROM with a degree of impingement given positive Hawkin's test. Reviewed MRI of right shoulder from 11/2013. Showed full thickness tear in the supraspinatous tendon as well as delamination in the infraspinatous tendon. Patient is also tender over the [facility] joint and MRI showed moderate AC OA.  - meloxicam (MOBIC) 15 MG tablet; take 1 tablet by mouth once daily for 14 days then if needed for pain  Dispense: 60 tablet; Refill: 0 given acute flare up of pain  - Ambulatory referral to Physical Therapy and shoulder exercises discussed  -offered corticosteroid injection today for pain relief, patient declined  -could consider re-imaging and/or referral to orthopedic surgery for possible repair although patient reports pain and mobility has not been bothersome until the past few days  -patient planning to see Sports Medicine again   Phill Myron, D.O. 12/08/2016, 1:44 PM PGY-3, [place]

## 2016-12-08 NOTE — Patient Instructions (Signed)
I have prescribed more Mobic for you. Take this once per day for two weeks then as needed. Continue to use Voltaren gel. Apply ice/heat to the area. Please make an appointment if you decide that you would like to do the steroid injection.   I have ordered physical therapy for you.

## 2016-12-15 IMAGING — CR DG HIP (WITH OR WITHOUT PELVIS) 2-3V*L*
3 series · 3 of 3 positions shown · non-contrast
Comparison: 07/28/2008

CLINICAL DATA: Left hip pain over the past 4 days. Difficulty
walking.

EXAM:
LEFT HIP - COMPLETE 2+ VIEW

[t pelvis ap]
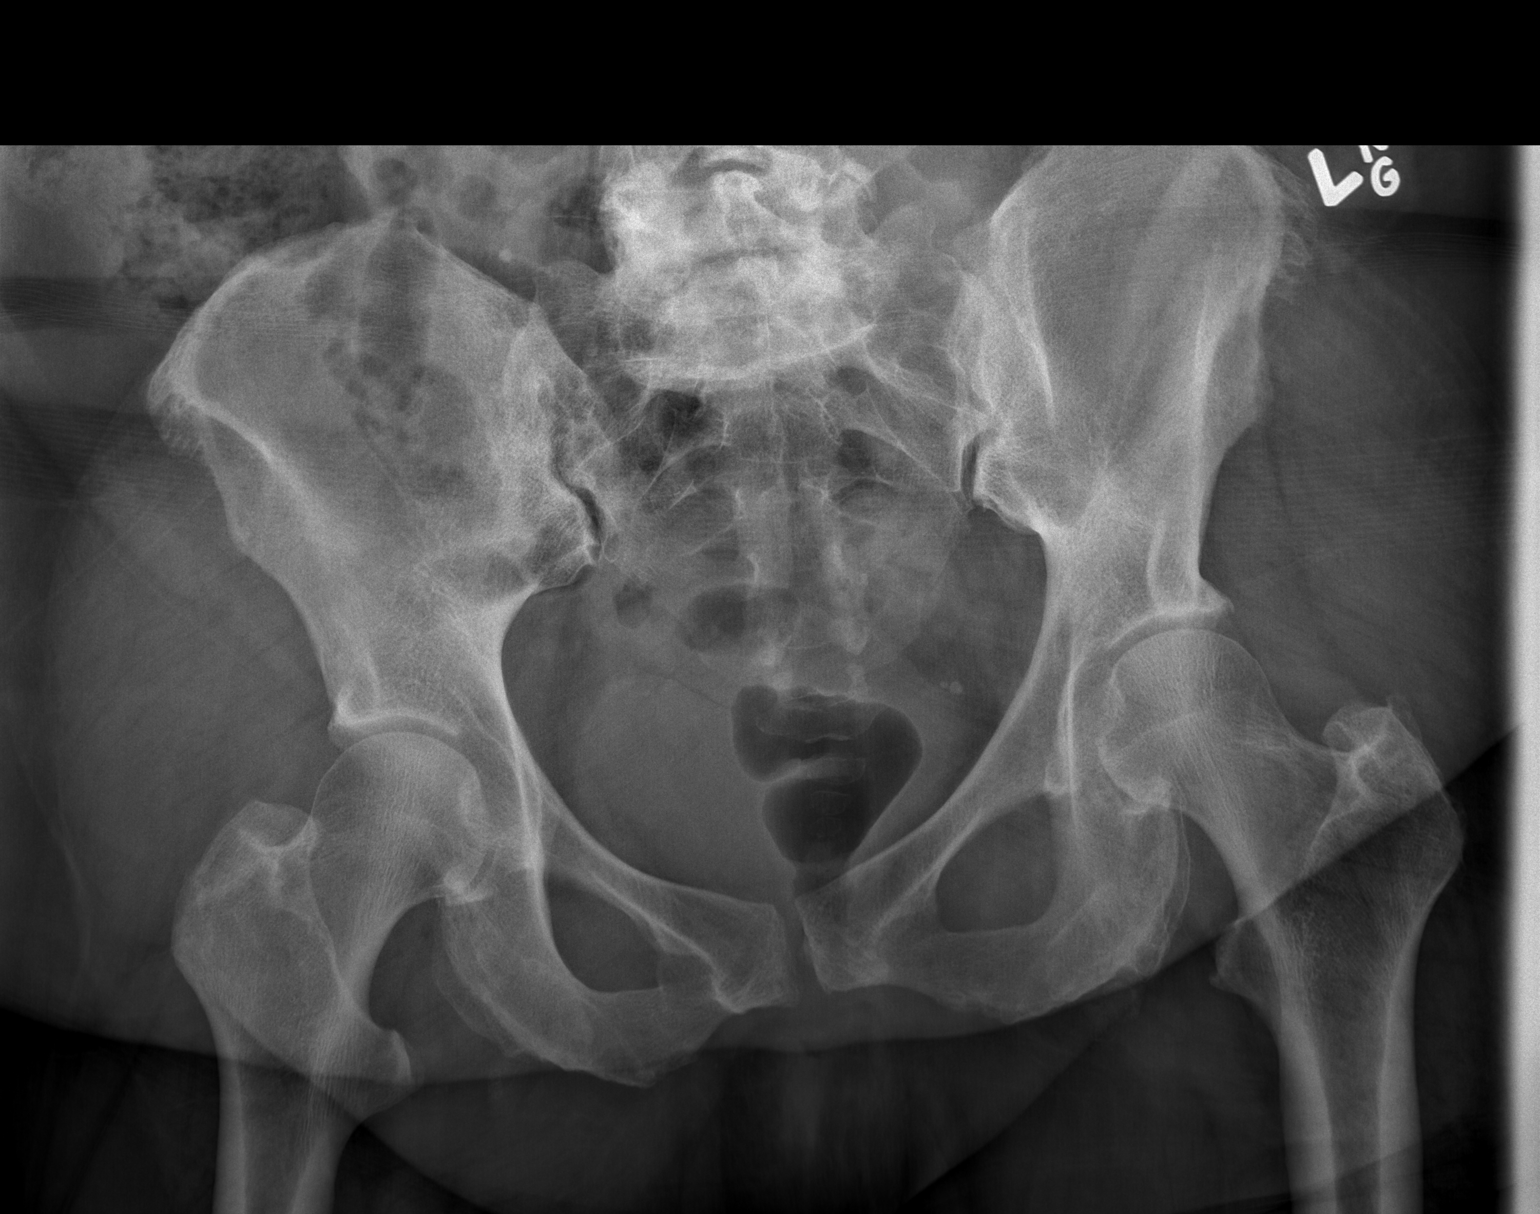

[t hip ap left]
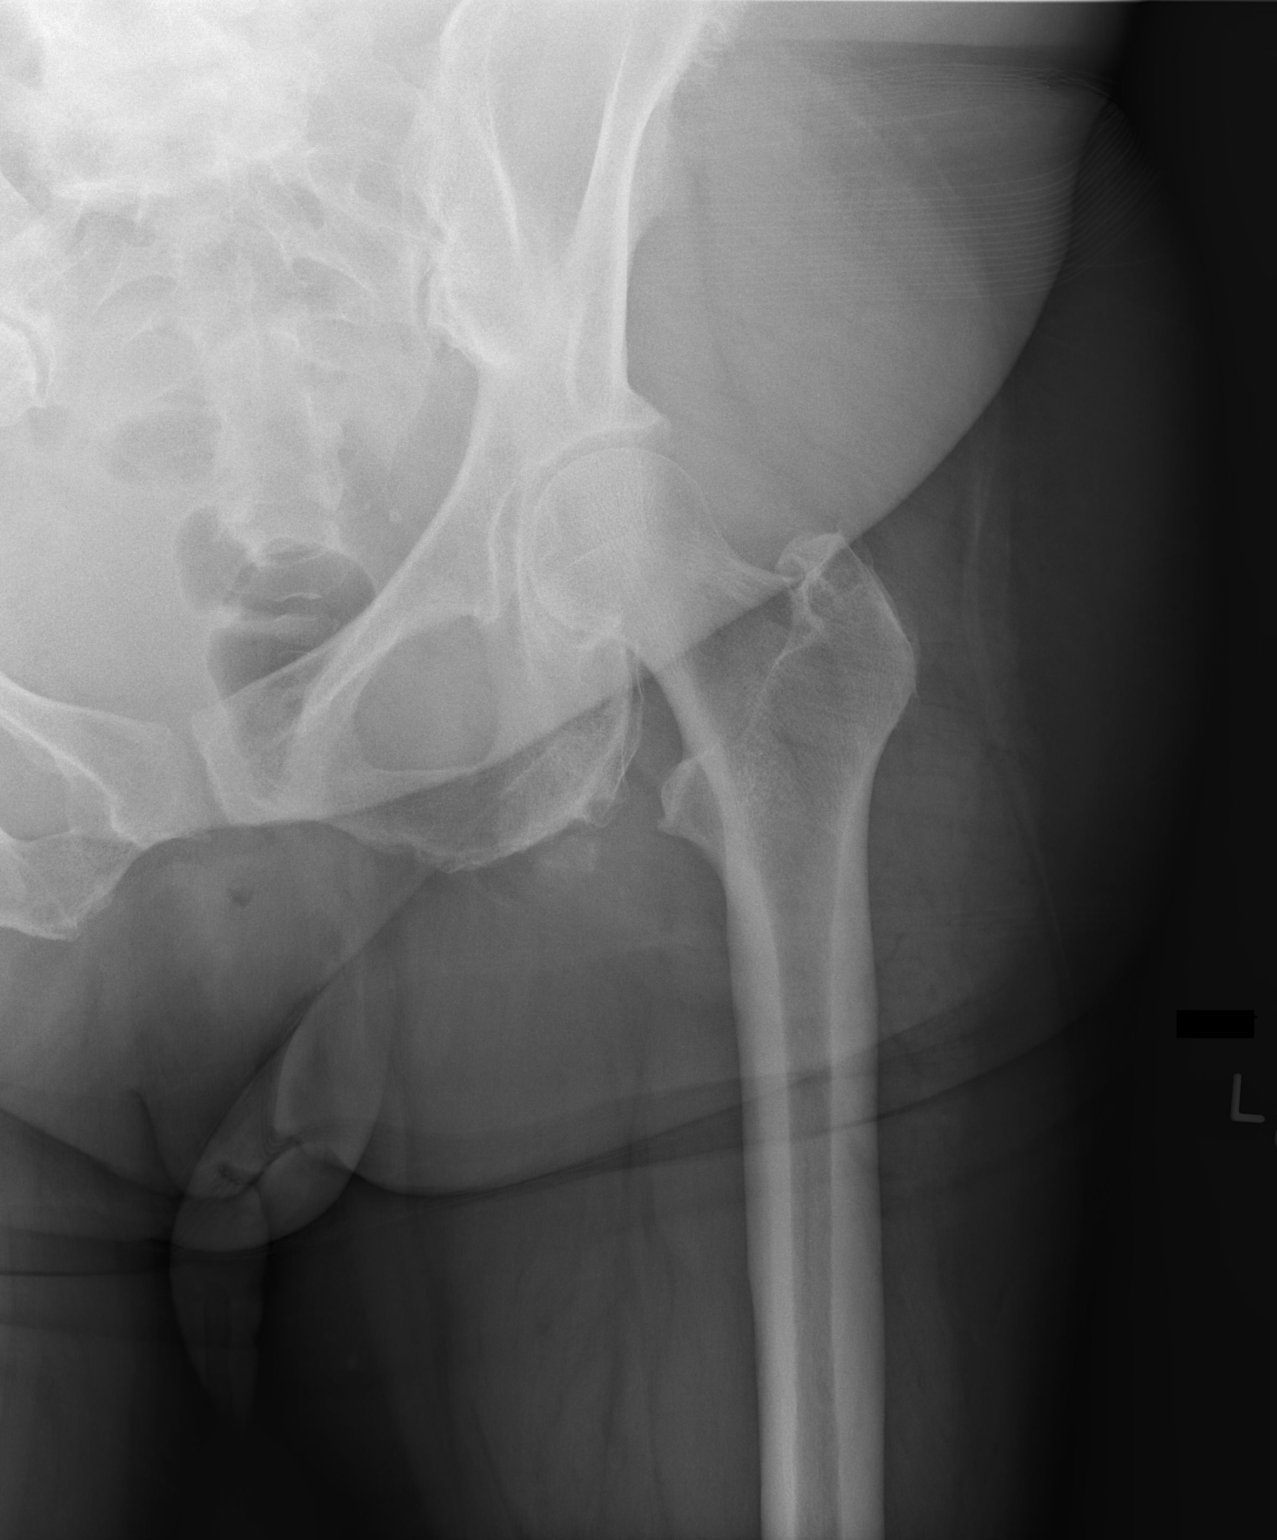

[t hip frog leg left]
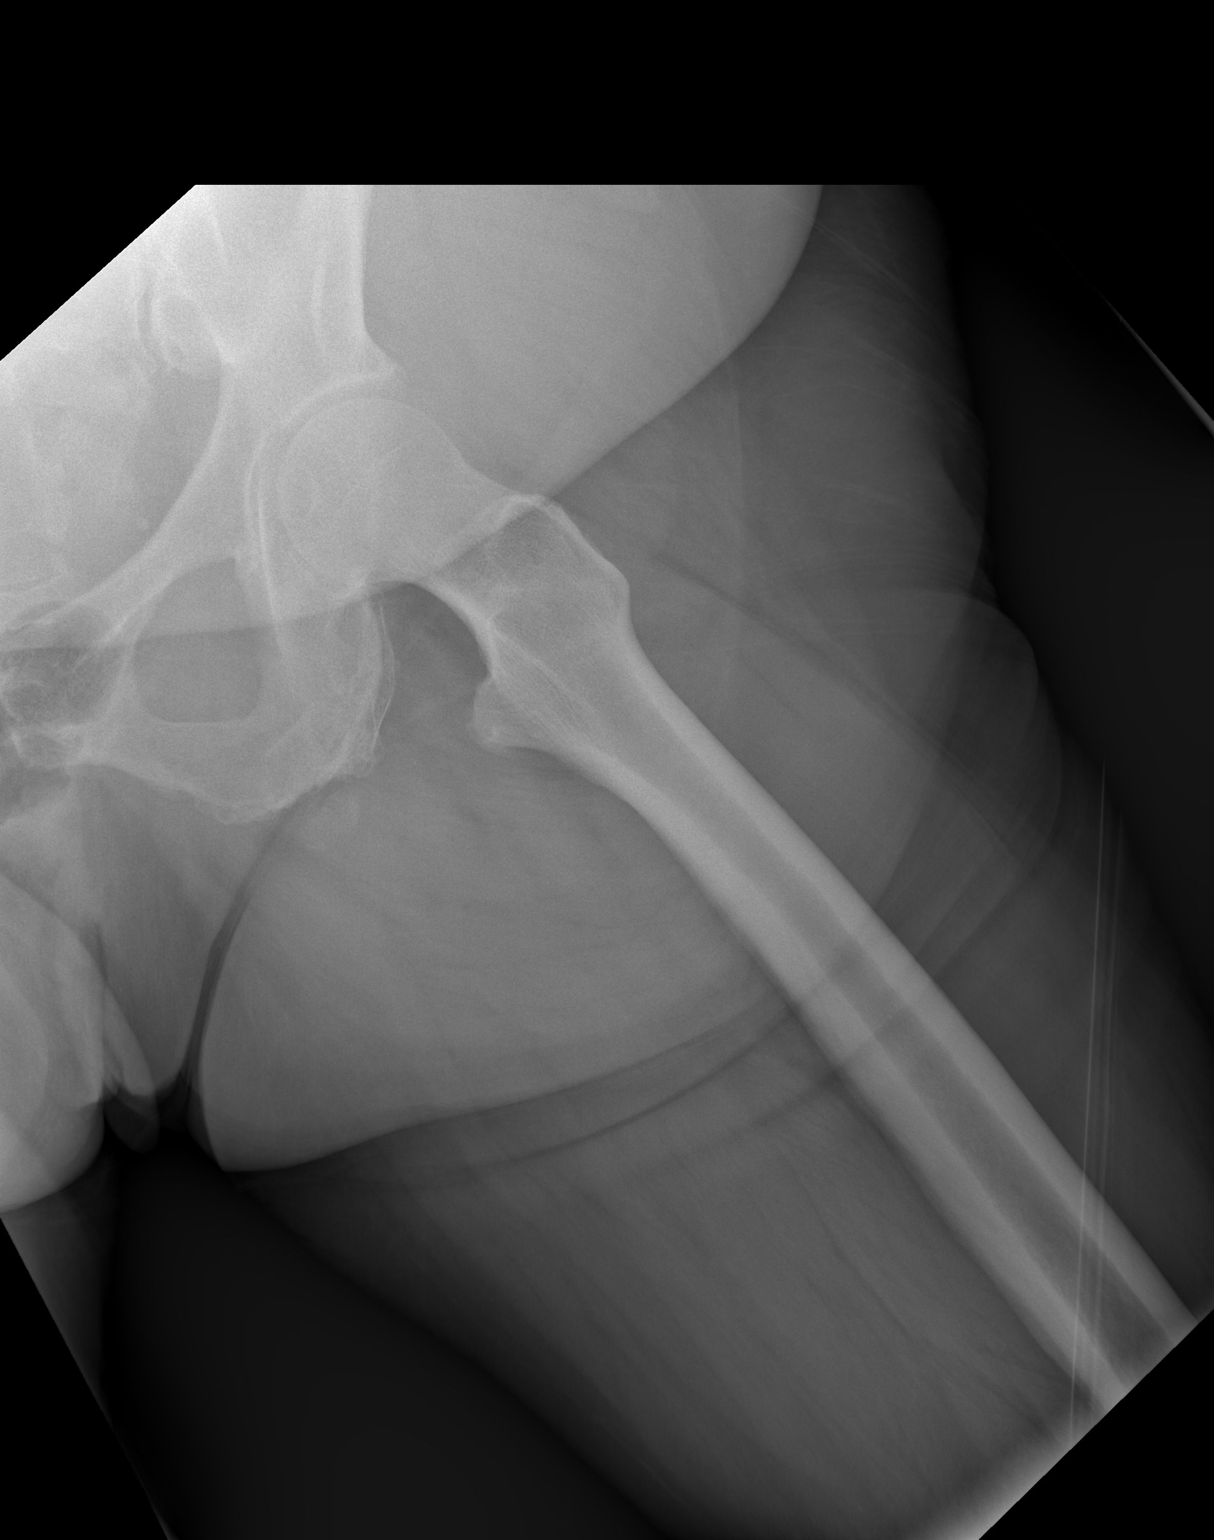

[3 of 3 positions shown; findings below may reference images not displayed]

FINDINGS: Faint calcification noted along the hip adductor musculature,
similar to prior and likely dystrophic. Mild spurring of the
trochanters. Articular space in the hips appears preserved.

No fracture or acute bony findings observed.
IMPRESSION: 1. Ill-defined calcification noted along the proximal hip adductor
musculature, likely dystrophic. No acute bony findings.

## 2016-12-17 ENCOUNTER — Ambulatory Visit (INDEPENDENT_AMBULATORY_CARE_PROVIDER_SITE_OTHER): Payer: Medicare Other | Admitting: Family Medicine

## 2016-12-17 DIAGNOSIS — M25511 Pain in right shoulder: Secondary | ICD-10-CM | POA: Diagnosis not present

## 2016-12-17 DIAGNOSIS — G8929 Other chronic pain: Secondary | ICD-10-CM | POA: Diagnosis not present

## 2016-12-17 NOTE — Progress Notes (Signed)
Chief complaint: Right anterior shoulder pain 2-3 weeks  History of present illness: Norma [name] is a 56 year old female who presents to the sports medicine office today with chief complaint of right anterior shoulder pain. She reports that symptoms started approximately 2-3 weeks ago. She does not report of any inciting incident, trauma, or injury to explain the pain. She does have known history of full-thickness tear of the supraspinatus tendon, MRI shows this from August 2015. She has not had any further evaluation from a surgical perspective from this. She reports that she did have shoulder surgery on her left side. She reports today having throbbing pain near the anterior shoulder near the [facility] joint. She rates the pain as a 3/10. She reports any type of abduction and arm raising or aggravating factors. She reports rest and meloxicam as alleviating factors. She reports that she was prescribed meloxicam a few weeks ago when she saw her primary physician for this issue. She does not report of any numbness, tingling, or weakness. She does not report of any fevers, chills, or night sweats.  Past medical history, surgical history, family history, social history were reviewed, does have history of right supraspinatus full-thickness tear, have rotator cuff repair on the left side, no current tobacco use  Allergies: Soma, nortriptyline, and prednisone  Review of systems: As stated above  Physical exam: Vital signs reviewed and are documented in chart General: Alert, oriented, appears stated age, in no apparent distress HEENT: Moist oral mucosa Respiratory: Normal respirations, able to speak in full sentences Cardiac: Normal peripheral pulses, regular rate Integumentary: No rashes on visible skin Neurologic: Strength 5/5 in bilateral upper extremities, sensation 2+ in bilateral upper extremities Psychiatric: Appropriate affect and mood Musculoskeletal: Inspection of right shoulder reveals no obvious  deformity or muscular atrophy, no warmth, erythema, ecchymosis, or effusion noted, she is tender to palpation along the right anterior shoulder near the [facility] joint. Pain elicited with any type of abduction and external range of motion. Range of motion 170 with forward flexion and abduction, and internal range of motion to 60, external range of motion to 45 on left side, Empty can, Hawkins, Neer, and cross and positive, Speed and Yergason negative  Assessment and plan: 1. Acute on chronic right shoulder pain, most likely from full-thickness supraspinatus tearing and AC arthropathy  Right shoulder pain -Reviewed MRI from August 2015 which does show that she has full-thickness supraspinatus tearing, delamination of infraspinatus muscle, and moderate AC osteoarthritis -Discussed various treatment options and evaluation today, including continuing with meloxicam, cortisone injection, and repeat MRI; she declines cortisone injections, says that they do not work for her, she would like to continue with meloxicam, but also would like to have a repeat MRI -She is considering rotator cuff repair, she reports that Dr. Berenice Primas with [facility] repaired her left rotator cuff, would like for him to do her right side as well   Will call patient after MRI results.   Mort Sawyers, MD Primary Care Sports Medicine Fellow [facility]

## 2016-12-17 NOTE — Patient Instructions (Signed)
We will call you after MRI results.

## 2016-12-21 NOTE — Progress Notes (Signed)
[facility]: Attending Note: I have reviewed the chart, discussed wit the Sports Medicine Fellow. I agree with assessment and treatment plan as detailed in the [place] note. Unclear if she is surgical candidate so will get new MRI.

## 2016-12-30 ENCOUNTER — Ambulatory Visit
Admission: RE | Admit: 2016-12-30 | Discharge: 2016-12-30 | Disposition: A | Discharge status: Home / Self Care | Payer: Medicare Other | Source: Ambulatory Visit | Attending: Family Medicine | Admitting: Family Medicine

## 2016-12-30 DIAGNOSIS — M25511 Pain in right shoulder: Secondary | ICD-10-CM | POA: Diagnosis not present

## 2016-12-30 DIAGNOSIS — G8929 Other chronic pain: Secondary | ICD-10-CM

## 2017-01-05 ENCOUNTER — Other Ambulatory Visit: Payer: Self-pay

## 2017-01-05 DIAGNOSIS — M75101 Unspecified rotator cuff tear or rupture of right shoulder, not specified as traumatic: Secondary | ICD-10-CM

## 2017-01-11 ENCOUNTER — Encounter: Payer: Self-pay | Admitting: Family Medicine

## 2017-01-11 ENCOUNTER — Ambulatory Visit (INDEPENDENT_AMBULATORY_CARE_PROVIDER_SITE_OTHER): Payer: Medicare Other | Admitting: Family Medicine

## 2017-01-11 DIAGNOSIS — M25511 Pain in right shoulder: Secondary | ICD-10-CM | POA: Diagnosis not present

## 2017-01-11 DIAGNOSIS — R232 Flushing: Secondary | ICD-10-CM

## 2017-01-11 DIAGNOSIS — R059 Cough, unspecified: Secondary | ICD-10-CM | POA: Insufficient documentation

## 2017-01-11 DIAGNOSIS — R05 Cough: Secondary | ICD-10-CM | POA: Diagnosis not present

## 2017-01-11 MED ORDER — MELOXICAM 15 MG PO TABS: ORAL_TABLET | ORAL | 0 refills | Status: DC

## 2017-01-11 MED ORDER — GUAIFENESIN-CODEINE 100-10 MG/5ML PO SYRP: 10.0000 mL | ORAL_SOLUTION | Freq: Three times a day (TID) | ORAL | 0 refills | Status: DC | PRN

## 2017-01-11 NOTE — Assessment & Plan Note (Signed)
Found to have large full-thickness, near full width tear of the supraspinatus tendon, moderate supraspinatus tendinosis, and severe subscapularis tendinosis on recent MRI. Following with Sports medicine and she was referred to orthopedics. She has not yet called Dr. Berenice Primas for an appointment due to other life stressors.  - Advised patient to call Dr. Jackalyn Lombard office to schedule an appointment for her shoulder - Continue Meloxacam - She has also been taking Norco and Flexeril which are chronic medications for her chronic back pain (sees pain management per her report)

## 2017-01-11 NOTE — Assessment & Plan Note (Addendum)
Likely post nasal drip from allergies. However she does endorse orthopnea which is concerning. She is on several cardiac medications (Cardizem, HCTZ, Imdur, Metoprolol tartrate) which she says her cardiologist Dr. Doylene Canard manages. States she thinks she has a fib, does not know if she has heart failure or not. Notes that she has been getting echocardiograms. Unable to see any records of this on Epic.  - Will give refill for robitussin cough medication  - Asked her to please return in 1 week if no improvement still

## 2017-01-11 NOTE — Progress Notes (Signed)
Subjective:    Patient ID: Norma [name] , female   DOB: 1960-12-13 , 56 y.o..   MRN: [id number]  HPI  Norma [name] is here for  Chief Complaint  Patient presents with  . Shoulder Pain  . Cough    1. Shoulder pain: Patient has right shoulder pain. She follows with sports medicine for this. She was found to have a full-thickness tear in her shoulder as well as various other tendinosis. She takes multiple medications for this including meloxicam, Norco, Flexeril. She notes that the meloxicam is helping a lot. She has not called Dr. Berenice Primas yet to schedule an orthopedic appointment. She notes that she has a lot going on at home and just has not had time.  2. COUGH  Onset: 2 months ago  Course has been a dry cough, mostly at night Worse with: laying down flat  Better with: cough syrup   Symptoms Sputum:no  Fever: no  Shortness of breath: sometimes when laying flat  Leg Swelling:no  Heart Burn or Reflux:yes, takes medication for this  Wheezing:no  Post Nasal Drip: yes   Red Flags Weight Loss:  no Immunocompromised:  no  PMH Asthma or COPD: no  PMH of Smoking: no  Using ACEIs: no   3. Hot flashes: Notes that for the last couple months her hot flashes have been bothersome. She will break into sweats at night or day time. She has tried [employer] in the past intermittently. Will have to change her clothes when she gets too sweaty.   Review of Systems: Per HPI.    Past Medical History: Patient Active Problem List   Diagnosis Date Noted  . Cough 01/11/2017  . Environmental allergies 11/03/2016  . Proteinuria 09/17/2014  . Flank pain, chronic 08/27/2014  . Other cyst of bone, right ankle and foot 06/21/2014  . Lumbar herniated disc 06/06/2014  . Depression   . Spinal stenosis, lumbar region, with neurogenic claudication 11/21/2013  . Right shoulder pain 04/18/2013  . Somatic dysfunction - Osteopathic Findings 07/02/2012  . Rotator cuff tear, left 05/05/2012  .  Decreased hearing 12/29/2011  . Hot flashes 06/30/2011  . Eczema, dyshidrotic 06/02/2010  . FIBROIDS, UTERUS 10/24/2009  . GERD 06/09/2009  . Chronic pain syndrome 10/04/2008  . HIP PAIN, LEFT, CHRONIC 08/16/2007  . Hepatitis C carrier ([place]) 03/21/2007  . Obesity 06/16/2006  . HYPERTENSION, BENIGN SYSTEMIC 06/16/2006    Medications: reviewed and updated Current Outpatient Prescriptions  Medication Sig Dispense Refill  . ARIPiprazole (ABILIFY) 2 MG tablet Take 2 mg by mouth daily.    . beta carotene w/minerals (OCUVITE) tablet Take 1 tablet by mouth daily.    Marland Kitchen buPROPion (WELLBUTRIN XL) 300 MG 24 hr tablet Take 300 mg by mouth daily.    . cetirizine (ZYRTEC) 5 MG tablet Take 1 tablet (5 mg total) by mouth daily. 30 tablet 3  . cyclobenzaprine (FLEXERIL) 10 MG tablet Take 1 tablet (10 mg total) by mouth 3 (three) times daily as needed for muscle spasms. 30 tablet 2  . desonide (DESOWEN) 0.05 % cream Apply topically 2 (two) times daily. Can use for 7-10 days. 30 g 0  . diclofenac sodium (VOLTAREN) 1 % GEL Apply 2 g topically 4 (four) times daily. 100 g 3  . diltiazem (CARDIZEM SR) 60 MG 12 hr capsule Take 1 capsule (60 mg total) by mouth 2 (two) times daily. 180 capsule 1  . fish oil-omega-3 fatty acids 1000 MG capsule Take 2 g by  mouth daily.    . hydrochlorothiazide (HYDRODIURIL) 25 MG tablet Take 1 tablet (25 mg total) by mouth daily. 90 tablet 3  . HYDROcodone-acetaminophen (NORCO/VICODIN) 5-325 MG per tablet Take 1 tablet by mouth every 6 (six) hours as needed for moderate pain.   0  . hydrOXYzine (ATARAX/VISTARIL) 10 MG tablet Take 10 mg by mouth 3 (three) times daily as needed for itching. Reported on 08/26/2015    . isosorbide mononitrate (IMDUR) 30 MG 24 hr tablet Take 30 mg by mouth daily.    . meloxicam (MOBIC) 15 MG tablet take 1 tablet by mouth once daily for 14 days then if needed for pain 60 tablet 0  . metoprolol tartrate (LOPRESSOR) 25 MG tablet Take 1 tablet (25 mg total) by  mouth 2 (two) times daily. 180 tablet 3  . omeprazole (PRILOSEC) 20 MG capsule take 1 capsule by mouth once daily 30 capsule 3  . pregabalin (LYRICA) 100 MG capsule Take 1 capsule (100 mg total) by mouth 2 (two) times daily. 60 capsule 5  . traZODone (DESYREL) 100 MG tablet Take 2 tablets (200 mg total) by mouth at bedtime. 180 tablet 0  . guaiFENesin-codeine (ROBITUSSIN AC) 100-10 MG/5ML syrup Take 10 mLs by mouth 3 (three) times daily as needed for cough. May cause drowsiness or sedation! 120 mL 0  . hydrocortisone cream 0.5 % Apply 1 application topically 2 (two) times daily. (Patient not taking: Reported on 01/11/2017) 30 g 0   No current facility-administered medications for this visit.     Social Hx:  reports that she has never smoked. She has never used smokeless tobacco.   Objective:   BP 130/74   Pulse 92   Temp 98.3 F (36.8 C) (Oral)   Ht 5\' 8"  (1.727 m)   Wt 258 lb (117 kg)   SpO2 96%   BMI 39.23 kg/m  Physical Exam  Gen: NAD, alert, cooperative with exam, well-appearing HEENT: NCAT, PERRL, clear conjunctiva, oropharynx clear, supple neck Cardiac: Regular rate and rhythm, normal S1/S2, no murmur, no edema, capillary refill brisk  Respiratory: Clear to auscultation bilaterally, no wheezes, non-labored breathing Psych: good insight, normal mood and affect  Assessment & Plan:  Right shoulder pain Found to have large full-thickness, near full width tear of the supraspinatus tendon, moderate supraspinatus tendinosis, and severe subscapularis tendinosis on recent MRI. Following with Sports medicine and she was referred to orthopedics. She has not yet called Dr. Berenice Primas for an appointment due to other life stressors.  - Advised patient to call Dr. Jackalyn Lombard office to schedule an appointment for her shoulder - Continue Meloxacam - She has also been taking Norco and Flexeril which are chronic medications for her chronic back pain (sees pain management per her  report)   Cough Likely post nasal drip from allergies. However she does endorse orthopnea which is concerning. She is on several cardiac medications (Cardizem, HCTZ, Imdur, Metoprolol tartrate) which she says her cardiologist Dr. Doylene Canard manages. States she thinks she has a fib, does not know if she has heart failure or not. Notes that she has been getting echocardiograms. Unable to see any records of this on Epic.  - Will give refill for robitussin cough medication  - Asked her to please return in 1 week if no improvement still  Hot flashes Endorsing intermittent hot flashes - Will try black cohosh and see if that helps her - Return if no improvement over the next couple weeks  No orders of the defined types were  placed in this encounter.  Meds ordered this encounter  Medications  . guaiFENesin-codeine (ROBITUSSIN AC) 100-10 MG/5ML syrup    Sig: Take 10 mLs by mouth 3 (three) times daily as needed for cough. May cause drowsiness or sedation!    Dispense:  120 mL    Refill:  0  . meloxicam (MOBIC) 15 MG tablet    Sig: take 1 tablet by mouth once daily for 14 days then if needed for pain    Dispense:  60 tablet    Refill:  0    Smitty Cords, MD [place], PGY-3

## 2017-01-11 NOTE — Patient Instructions (Signed)
Thank you for coming in today, it was so nice to see you! Today we talked about:    Cough: You can try the cough syrup. If you are still coughing over even after 1-2 weeks, please come back and see me  Hot flashes: try black cohosh every day  Call Dr. Berenice Primas to schedule an appointment to talk about your shoulder   Please follow up in 2 weeks if you are still coughing.   If you have any questions or concerns, please do not hesitate to call the office at [phone]. You can also message me directly via MyChart.   Sincerely,  Smitty Cords, MD

## 2017-01-17 ENCOUNTER — Other Ambulatory Visit: Payer: Self-pay | Admitting: Family Medicine

## 2017-01-17 DIAGNOSIS — Z1239 Encounter for other screening for malignant neoplasm of breast: Secondary | ICD-10-CM

## 2017-01-18 DIAGNOSIS — M75121 Complete rotator cuff tear or rupture of right shoulder, not specified as traumatic: Secondary | ICD-10-CM | POA: Diagnosis not present

## 2017-01-18 DIAGNOSIS — M24811 Other specific joint derangements of right shoulder, not elsewhere classified: Secondary | ICD-10-CM | POA: Diagnosis not present

## 2017-01-18 NOTE — Assessment & Plan Note (Signed)
Endorsing intermittent hot flashes - Will try black cohosh and see if that helps her - Return if no improvement over the next couple weeks

## 2017-01-27 ENCOUNTER — Ambulatory Visit: Payer: Medicare Other

## 2017-02-02 DIAGNOSIS — H40003 Preglaucoma, unspecified, bilateral: Secondary | ICD-10-CM | POA: Diagnosis not present

## 2017-02-02 DIAGNOSIS — H527 Unspecified disorder of refraction: Secondary | ICD-10-CM | POA: Diagnosis not present

## 2017-02-02 DIAGNOSIS — H04123 Dry eye syndrome of bilateral lacrimal glands: Secondary | ICD-10-CM | POA: Diagnosis not present

## 2017-02-02 DIAGNOSIS — H269 Unspecified cataract: Secondary | ICD-10-CM | POA: Diagnosis not present

## 2017-02-11 ENCOUNTER — Other Ambulatory Visit: Payer: Self-pay | Admitting: Family Medicine

## 2017-02-15 DIAGNOSIS — M545 Low back pain: Secondary | ICD-10-CM | POA: Diagnosis not present

## 2017-02-15 DIAGNOSIS — I10 Essential (primary) hypertension: Secondary | ICD-10-CM | POA: Diagnosis not present

## 2017-02-15 DIAGNOSIS — M5416 Radiculopathy, lumbar region: Secondary | ICD-10-CM | POA: Diagnosis not present

## 2017-02-15 DIAGNOSIS — M5136 Other intervertebral disc degeneration, lumbar region: Secondary | ICD-10-CM | POA: Diagnosis not present

## 2017-02-24 ENCOUNTER — Ambulatory Visit (INDEPENDENT_AMBULATORY_CARE_PROVIDER_SITE_OTHER): Payer: Medicare Other | Admitting: Family Medicine

## 2017-02-24 ENCOUNTER — Encounter: Payer: Self-pay | Admitting: Family Medicine

## 2017-02-24 VITALS — BP 148/78 | HR 82 | Temp 98.2°F

## 2017-02-24 DIAGNOSIS — I10 Essential (primary) hypertension: Secondary | ICD-10-CM | POA: Diagnosis not present

## 2017-02-24 DIAGNOSIS — F32A Depression, unspecified: Secondary | ICD-10-CM

## 2017-02-24 DIAGNOSIS — R05 Cough: Secondary | ICD-10-CM | POA: Diagnosis not present

## 2017-02-24 DIAGNOSIS — Z1329 Encounter for screening for other suspected endocrine disorder: Secondary | ICD-10-CM | POA: Diagnosis not present

## 2017-02-24 DIAGNOSIS — R252 Cramp and spasm: Secondary | ICD-10-CM | POA: Diagnosis not present

## 2017-02-24 DIAGNOSIS — R059 Cough, unspecified: Secondary | ICD-10-CM

## 2017-02-24 DIAGNOSIS — Z23 Encounter for immunization: Secondary | ICD-10-CM | POA: Diagnosis not present

## 2017-02-24 DIAGNOSIS — R5383 Other fatigue: Secondary | ICD-10-CM | POA: Diagnosis not present

## 2017-02-24 DIAGNOSIS — F329 Major depressive disorder, single episode, unspecified: Secondary | ICD-10-CM | POA: Diagnosis not present

## 2017-02-24 DIAGNOSIS — E876 Hypokalemia: Secondary | ICD-10-CM | POA: Diagnosis not present

## 2017-02-24 MED ORDER — ESOMEPRAZOLE MAGNESIUM 40 MG PO CPDR
40.0000 mg | DELAYED_RELEASE_CAPSULE | Freq: Every day | ORAL | 3 refills | Status: DC
Start: 2017-02-24 — End: 2017-08-08

## 2017-02-24 NOTE — Progress Notes (Signed)
Subjective:    Patient ID: Norma [name] , female   DOB: [birth year] , 56 y.o..   MRN: [id number]  HPI  Norma [name] is here for  Chief Complaint  Patient presents with  . Muscle Cramping    1.  Muscle cramping; patient endorses muscle cramps in her feet, legs, stomach.  She notes that this is been going on for about 1 week.  She even went to the emergency department because it got so bad.  She notes that she has been trying to eat mustard for relief.  She has had this happen before to her over the years but it has been off and on.  Whenever her muscles cramp up and lasts for about 5 minutes and then goes away.  She notes that she has been more depressed recently because she is struggling with taking care of her mom who has dementia and caring for her grandchildren.  She does go to [facility] for counseling and psychological therapy.  Patient does note that she does not drink much water.  2.  Cough; patient notes that she continues to have coughing.  She was last seen in September and given Robitussin.  She notes that this helped her a lot but when she ran out of the medicine she continued to cough again.  She coughs more in the morning then during the day or at night.  She does endorse having some heartburn at times.  Does not endorse any nasal drainage or allergy symptoms.  Is not on any ACE inhibitors.  Review of Systems: Per HPI.   Past Medical History: Patient Active Problem List   Diagnosis Date Noted  . Cough 01/11/2017  . Environmental allergies 11/03/2016  . Proteinuria 09/17/2014  . Flank pain, chronic 08/27/2014  . Other cyst of bone, right ankle and foot 06/21/2014  . Lumbar herniated disc 06/06/2014  . Depression   . Spinal stenosis, lumbar region, with neurogenic claudication 11/21/2013  . Muscle cramps 06/21/2013  . Right shoulder pain 04/18/2013  . Somatic dysfunction - Osteopathic Findings 07/02/2012  . Rotator cuff tear, left 05/05/2012  . Decreased hearing  12/29/2011  . Hot flashes 06/30/2011  . Eczema, dyshidrotic 06/02/2010  . FIBROIDS, UTERUS 10/24/2009  . GERD 06/09/2009  . Chronic pain syndrome 10/04/2008  . HIP PAIN, LEFT, CHRONIC 08/16/2007  . Hepatitis C carrier ([place]) 03/21/2007  . Obesity 06/16/2006  . HYPERTENSION, BENIGN SYSTEMIC 06/16/2006    Medications: reviewed and updated Current Outpatient Medications  Medication Sig Dispense Refill  . ARIPiprazole (ABILIFY) 2 MG tablet Take 2 mg by mouth daily.    . beta carotene w/minerals (OCUVITE) tablet Take 1 tablet by mouth daily.    Marland Kitchen buPROPion (WELLBUTRIN XL) 300 MG 24 hr tablet Take 300 mg by mouth daily.    . cetirizine (ZYRTEC) 5 MG tablet Take 1 tablet (5 mg total) by mouth daily. 30 tablet 3  . cyclobenzaprine (FLEXERIL) 10 MG tablet Take 1 tablet (10 mg total) by mouth 3 (three) times daily as needed for muscle spasms. 30 tablet 2  . desonide (DESOWEN) 0.05 % cream Apply topically 2 (two) times daily. Can use for 7-10 days. 30 g 0  . diclofenac sodium (VOLTAREN) 1 % GEL Apply 2 g topically 4 (four) times daily. 100 g 3  . diltiazem (CARDIZEM SR) 60 MG 12 hr capsule Take 1 capsule (60 mg total) by mouth 2 (two) times daily. 180 capsule 1  . esomeprazole (NEXIUM) 40 MG capsule Take  1 capsule (40 mg total) at bedtime by mouth. 30 capsule 3  . fish oil-omega-3 fatty acids 1000 MG capsule Take 2 g by mouth daily.    Marland Kitchen guaiFENesin-codeine (ROBITUSSIN AC) 100-10 MG/5ML syrup Take 10 mLs by mouth 3 (three) times daily as needed for cough. May cause drowsiness or sedation! 120 mL 0  . hydrochlorothiazide (HYDRODIURIL) 25 MG tablet Take 1 tablet (25 mg total) by mouth daily. 90 tablet 3  . HYDROcodone-acetaminophen (NORCO/VICODIN) 5-325 MG per tablet Take 1 tablet by mouth every 6 (six) hours as needed for moderate pain.   0  . hydrocortisone cream 0.5 % Apply 1 application topically 2 (two) times daily. (Patient not taking: Reported on 01/11/2017) 30 g 0  . hydrOXYzine  (ATARAX/VISTARIL) 10 MG tablet Take 10 mg by mouth 3 (three) times daily as needed for itching. Reported on 08/26/2015    . isosorbide mononitrate (IMDUR) 30 MG 24 hr tablet Take 30 mg by mouth daily.    . meloxicam (MOBIC) 15 MG tablet take 1 tablet by mouth once daily for 14 days then if needed for pain 60 tablet 0  . metoprolol tartrate (LOPRESSOR) 25 MG tablet Take 1 tablet (25 mg total) by mouth 2 (two) times daily. 180 tablet 3  . pregabalin (LYRICA) 100 MG capsule Take 1 capsule (100 mg total) by mouth 2 (two) times daily. 60 capsule 5  . traZODone (DESYREL) 100 MG tablet Take 2 tablets (200 mg total) by mouth at bedtime. 180 tablet 0   No current facility-administered medications for this visit.     Social Hx:  reports that  has never smoked. she has never used smokeless tobacco.   Objective:   BP (!) 148/78   Pulse 82   Temp 98.2 F (36.8 C) (Oral)   SpO2 97%  Physical Exam  Gen: NAD, alert, cooperative with exam, well-appearing HEENT: NCAT, PERRL, clear conjunctiva, oropharynx clear, supple neck Cardiac: Regular rate and rhythm, normal S1/S2, no murmur, no edema, capillary refill brisk  Respiratory: Clear to auscultation bilaterally, no wheezes, non-labored breathing Gastrointestinal: soft, non tender, non distended, bowel sounds present Skin: no rashes, normal turgor  Neurological: no gross deficits.  Psych: good insight, normal mood and affect  Assessment & Plan:  Cough Persistent cough for the last couple months.  Initially thought to be from postnasal drip however patient denies that she has any runny nose or nasal congestion.  Has endorsed orthopnea in the past which is concerning for heart failure however her lungs are clear to auscultation and she has no swelling in her extremities.  As mentioned in previous notes she is on several cardiac medications including Cardizem, HCTZ, Imdur, metoprolol tartrate which her cardiologist manages but she has not been to recently.   Leading differential at this point is likely GERD. -Avoid any prescription cough suppressant such as Robitussin at this time -Start Nexium 40 mg daily -Will check BNP to ensure this is not a heart failure picture although clinically does not appear to be so -Follow-up in 2 weeks  Muscle cramps Unclear etiology at this time.  Has been intermittent and chronic although worsening over the last week for her.  Cramps occur in her legs and abdomen.  Per epic review she previously was in the ER for this in 2015 and had a elevated CPK of 649.  There was some concern for polymyositis or dermatomyositis and his CPK, LDH, aldolase was obtained; all were normal except CPK continued to be elevated at 631.  Do not see in epic where this was followed up on or rechecked. -Check CBC (iron deficiency anemia at times can cause muscle cramps), BMP, TSH, vitamin D -Encouraged patient to stay active and keep well-hydrated -She is already taking Flexeril -Follow-up in 2 weeks.  At that time can also recheck CPK and think about other possible differentials  Orders Placed This Encounter  Procedures  . CBC with Differential  . TSH  . VITAMIN D 25 Hydroxy (Vit-D Deficiency, Fractures)  . Brain natriuretic peptide  . Basic Metabolic Panel   Meds ordered this encounter  Medications  . esomeprazole (NEXIUM) 40 MG capsule    Sig: Take 1 capsule (40 mg total) at bedtime by mouth.    Dispense:  30 capsule    Refill:  3    Smitty Cords, MD [place], PGY-3

## 2017-02-24 NOTE — Assessment & Plan Note (Addendum)
Unclear etiology at this time.  Has been intermittent and chronic although worsening over the last week for her.  Cramps occur in her legs and abdomen.  Per epic review she previously was in the ER for this in 2015 and had a elevated CPK of 649.  There was some concern for polymyositis or dermatomyositis and his CPK, LDH, aldolase was obtained; all were normal except CPK continued to be elevated at 631.  Do not see in epic where this was followed up on or rechecked. -Check CBC (iron deficiency anemia at times can cause muscle cramps), BMP, TSH, vitamin D -Encouraged patient to stay active and keep well-hydrated -She is already taking Flexeril -Follow-up in 2 weeks.  At that time can also recheck CPK and think about other possible differentials

## 2017-02-24 NOTE — Assessment & Plan Note (Signed)
Persistent cough for the last couple months.  Initially thought to be from postnasal drip however patient denies that she has any runny nose or nasal congestion.  Has endorsed orthopnea in the past which is concerning for heart failure however her lungs are clear to auscultation and she has no swelling in her extremities.  As mentioned in previous notes she is on several cardiac medications including Cardizem, HCTZ, Imdur, metoprolol tartrate which her cardiologist manages but she has not been to recently.  Leading differential at this point is likely GERD. -Avoid any prescription cough suppressant such as Robitussin at this time -Start Nexium 40 mg daily -Will check BNP to ensure this is not a heart failure picture although clinically does not appear to be so -Follow-up in 2 weeks

## 2017-02-24 NOTE — Patient Instructions (Signed)
Thank you for coming in today, it was so nice to see you! Today we talked about:    Coughing: This is likely coming from uncontrolled acid reflux which can irritate your throat at night. You can take honey or cough drops. Hold off on robitussin for now.   Cramps: This may be from dehydration. Drinks plenty of clear liquids until your pee is clear to light yellow. Take mustard as needed  We have checked your labs for any vitamin problems  Please follow up in 2 weeks. You can schedule this appointment at the front desk before you leave or call the clinic.  If we ordered any tests today, you will be notified via telephone of any abnormalities. If everything is normal you will get a letter in the mail.   If you have any questions or concerns, please do not hesitate to call the office at [phone]. You can also message me directly via MyChart.   Sincerely,  Smitty Cords, MD

## 2017-02-25 ENCOUNTER — Telehealth: Payer: Self-pay | Admitting: Family Medicine

## 2017-02-25 LAB — CBC WITH DIFFERENTIAL/PLATELET
BASOS: 1 %
Basophils Absolute: 0 10*3/uL (ref 0.0–0.2)
EOS (ABSOLUTE): 0.1 10*3/uL (ref 0.0–0.4)
EOS: 2 %
HEMATOCRIT: 33 % — AB (ref 34.0–46.6)
HEMOGLOBIN: 10.3 g/dL — AB (ref 11.1–15.9)
IMMATURE GRANS (ABS): 0 10*3/uL (ref 0.0–0.1)
Immature Granulocytes: 0 %
LYMPHS ABS: 2.6 10*3/uL (ref 0.7–3.1)
LYMPHS: 45 %
MCH: 24.6 pg — AB (ref 26.6–33.0)
MCHC: 31.2 g/dL — AB (ref 31.5–35.7)
MCV: 79 fL (ref 79–97)
MONOCYTES: 9 %
Monocytes Absolute: 0.5 10*3/uL (ref 0.1–0.9)
NEUTROS ABS: 2.5 10*3/uL (ref 1.4–7.0)
Neutrophils: 43 %
Platelets: 242 10*3/uL (ref 150–379)
RBC: 4.19 x10E6/uL (ref 3.77–5.28)
RDW: 16.4 % — ABNORMAL HIGH (ref 12.3–15.4)
WBC: 5.8 10*3/uL (ref 3.4–10.8)

## 2017-02-25 LAB — BASIC METABOLIC PANEL
BUN / CREAT RATIO: 11 (ref 9–23)
BUN: 11 mg/dL (ref 6–24)
CO2: 26 mmol/L (ref 20–29)
CREATININE: 0.99 mg/dL (ref 0.57–1.00)
Calcium: 8.6 mg/dL — ABNORMAL LOW (ref 8.7–10.2)
Chloride: 101 mmol/L (ref 96–106)
GFR, EST AFRICAN AMERICAN: 73 mL/min/{1.73_m2} (ref >59)
GFR, EST NON AFRICAN AMERICAN: 63 mL/min/{1.73_m2} (ref >59)
Glucose: 107 mg/dL — ABNORMAL HIGH (ref 65–99)
POTASSIUM: 3.8 mmol/L (ref 3.5–5.2)
SODIUM: 138 mmol/L (ref 134–144)

## 2017-02-25 LAB — BRAIN NATRIURETIC PEPTIDE: BNP: 6.4 pg/mL (ref 0.0–100.0)

## 2017-02-25 LAB — TSH: TSH: 1.23 u[IU]/mL (ref 0.450–4.500)

## 2017-02-25 LAB — VITAMIN D 25 HYDROXY (VIT D DEFICIENCY, FRACTURES): VIT D 25 HYDROXY: 7.7 ng/mL — AB (ref 30.0–100.0)

## 2017-02-25 NOTE — Telephone Encounter (Signed)
Attempted to call patient about her low hemoglobin and vitamin D. No answer. Left voicemail. Will try to get in touch with her on Monday.   Smitty Cords, MD [place], PGY-3

## 2017-03-01 ENCOUNTER — Telehealth: Payer: Self-pay | Admitting: Family Medicine

## 2017-03-01 DIAGNOSIS — E559 Vitamin D deficiency, unspecified: Secondary | ICD-10-CM | POA: Insufficient documentation

## 2017-03-01 DIAGNOSIS — D649 Anemia, unspecified: Secondary | ICD-10-CM | POA: Insufficient documentation

## 2017-03-01 MED ORDER — CENTRUM PO CHEW: 1.0000 | CHEWABLE_TABLET | Freq: Every day | ORAL | 3 refills | Status: DC

## 2017-03-01 MED ORDER — VITAMIN D (ERGOCALCIFEROL) 1.25 MG (50000 UNIT) PO CAPS: 50000.0000 [IU] | ORAL_CAPSULE | ORAL | 0 refills | Status: DC

## 2017-03-01 NOTE — Telephone Encounter (Signed)
Called patient to discuss her blood work:   1. She has mild anemia, hgb 10.3 MCV 79 2. She has vitamin D deficiency, Vit D 7.7  Plan:  1. Discussed taking multivitamin with iron daily and rechecking hgb in 1 month. If she continues to be anemic can do anemia panel. She is overdue for her colonoscopy according to Epic, can discuss at next visit 2. Rx for Vit D 50,000 IU sent over to pharmacy for 8 week course, check Vit D in 8 weeks.   All questions answered and she was appreciative of the call.   Smitty Cords, MD [place], PGY-3

## 2017-03-08 ENCOUNTER — Ambulatory Visit: Payer: Medicare Other | Admitting: Family Medicine

## 2017-03-17 ENCOUNTER — Ambulatory Visit
Admission: RE | Admit: 2017-03-17 | Discharge: 2017-03-17 | Disposition: A | Discharge status: Home / Self Care | Payer: Medicare Other | Source: Ambulatory Visit | Attending: Cardiovascular Disease | Admitting: Cardiovascular Disease

## 2017-03-17 ENCOUNTER — Other Ambulatory Visit: Payer: Self-pay | Admitting: Cardiovascular Disease

## 2017-03-17 DIAGNOSIS — R072 Precordial pain: Secondary | ICD-10-CM | POA: Diagnosis not present

## 2017-03-17 DIAGNOSIS — R0602 Shortness of breath: Secondary | ICD-10-CM

## 2017-03-17 DIAGNOSIS — I10 Essential (primary) hypertension: Secondary | ICD-10-CM | POA: Diagnosis not present

## 2017-03-17 DIAGNOSIS — E119 Type 2 diabetes mellitus without complications: Secondary | ICD-10-CM | POA: Diagnosis not present

## 2017-03-24 ENCOUNTER — Other Ambulatory Visit: Payer: Self-pay

## 2017-03-24 ENCOUNTER — Encounter: Payer: Self-pay | Admitting: Family Medicine

## 2017-03-24 ENCOUNTER — Ambulatory Visit (INDEPENDENT_AMBULATORY_CARE_PROVIDER_SITE_OTHER): Payer: Medicare Other | Admitting: Family Medicine

## 2017-03-24 ENCOUNTER — Ambulatory Visit: Payer: Medicare Other | Admitting: Family Medicine

## 2017-03-24 VITALS — BP 128/68 | HR 101 | Temp 98.2°F | Ht 68.0 in | Wt 265.6 lb

## 2017-03-24 DIAGNOSIS — E559 Vitamin D deficiency, unspecified: Secondary | ICD-10-CM

## 2017-03-24 DIAGNOSIS — E118 Type 2 diabetes mellitus with unspecified complications: Secondary | ICD-10-CM

## 2017-03-24 DIAGNOSIS — R7303 Prediabetes: Secondary | ICD-10-CM

## 2017-03-24 DIAGNOSIS — D509 Iron deficiency anemia, unspecified: Secondary | ICD-10-CM | POA: Diagnosis not present

## 2017-03-24 DIAGNOSIS — D649 Anemia, unspecified: Secondary | ICD-10-CM

## 2017-03-24 DIAGNOSIS — R252 Cramp and spasm: Secondary | ICD-10-CM | POA: Diagnosis not present

## 2017-03-24 DIAGNOSIS — Z130 Encounter for screening for diseases of the blood and blood-forming organs and certain disorders involving the immune mechanism: Secondary | ICD-10-CM | POA: Diagnosis not present

## 2017-03-24 MED ORDER — VITAMIN D (ERGOCALCIFEROL) 1.25 MG (50000 UNIT) PO CAPS: 50000.0000 [IU] | ORAL_CAPSULE | ORAL | 0 refills | Status: DC

## 2017-03-24 NOTE — Patient Instructions (Signed)
Thank you for coming in today, it was so nice to see you! Today we talked about:    Muscle cramps and fatigue: This could be due to anemia and vitamin D deficiency.  I have refilled your vitamin D.  Continue taking 1 pill a week until you have no more pills.  Continue the mustard as needed.  We have ordered an anemia panel today to see if you have any iron or vitamin deficiencies  Please check your stool using the colo guard at home so we can make sure you are not bleeding from your colon  Please follow up in 1 month for fatigue and muscle cramps. You can schedule this appointment at the front desk before you leave or call the clinic.  Bring in all your medications or supplements to each appointment for review.   If we ordered any tests today, you will be notified via telephone of any abnormalities. If everything is normal you will get a letter in the mail.   If you have any questions or concerns, please do not hesitate to call the office at [phone]. You can also message me directly via MyChart.   Sincerely,  Smitty Cords, MD

## 2017-03-24 NOTE — Progress Notes (Signed)
Subjective:    Patient ID: Norma [name] , female   DOB: [birth year] , 56 y.o..   MRN: [id number]  HPI  Norma [name] is here for  Chief Complaint  Patient presents with  . Body Cramps    1. Muscle cramps and fatigue: Patient coming in today with continued muscle cramps mostly in her stomach, legs, and feet this is been ongoing since November 2018, for about 1 month now.  She notes that she has been eating mustard which does provide relief.  She notes that her cramps continue to last for about 5 minutes mostly at night and they go away.  She continues to endorse depression due to life stressors as her mom has dementia and she is also caring for her grandchildren.  She notes that she goes to [facility] for counseling and psychological therapy.  She notes that she is trying to drink more water however she was recently put on a diuretic because her heart doctor thought that she was retaining fluid.  2.  Vitamin D deficiency; patient states that she took 3 weeks worth of the vitamin D medication but her insurance would not pay for anymore than that.  Review of Systems: Per HPI.    Past Medical History: Patient Active Problem List   Diagnosis Date Noted  . Vitamin D deficiency 03/01/2017  . Anemia 03/01/2017  . Cough 01/11/2017  . Environmental allergies 11/03/2016  . Proteinuria 09/17/2014  . Flank pain, chronic 08/27/2014  . Other cyst of bone, right ankle and foot 06/21/2014  . Lumbar herniated disc 06/06/2014  . Depression   . Prediabetes 01/14/2014  . Spinal stenosis, lumbar region, with neurogenic claudication 11/21/2013  . Muscle cramps 06/21/2013  . Right shoulder pain 04/18/2013  . Somatic dysfunction - Osteopathic Findings 07/02/2012  . Rotator cuff tear, left 05/05/2012  . Decreased hearing 12/29/2011  . Hot flashes 06/30/2011  . Eczema, dyshidrotic 06/02/2010  . FIBROIDS, UTERUS 10/24/2009  . GERD 06/09/2009  . Chronic pain syndrome 10/04/2008  . HIP PAIN, LEFT,  CHRONIC 08/16/2007  . Hepatitis C carrier ([place]) 03/21/2007  . Obesity 06/16/2006  . HYPERTENSION, BENIGN SYSTEMIC 06/16/2006    Medications: reviewed   Social Hx:  reports that  has never smoked. she has never used smokeless tobacco.   Objective:   BP 128/68   Pulse (!) 101   Temp 98.2 F (36.8 C) (Oral)   Ht 5\' 8"  (1.727 m)   Wt 265 lb 9.6 oz (120.5 kg)   SpO2 96%   BMI 40.38 kg/m  Physical Exam  Gen: NAD, alert, cooperative with exam, well-appearing Cardiac: Regular rate and rhythm, normal S1/S2, no murmur, no edema, capillary refill brisk  Respiratory: Clear to auscultation bilaterally, no wheezes, non-labored breathing Neurological: no gross deficits.  MSK: 5/5 strength in bilateral upper and lower extremities  Assessment & Plan:  Muscle cramps Patient continues to be symptomatic.  This could be secondary to polymyositis (there was concern for this in the past when her CPK was high) vs dehydration vs iron deficiency.  TSH reassuringly normal.  BMP normal when checked last month. - CK still high at 900 when rechecked, asked patient to come in next week so we can check this again -Advised patient to keep well-hydrated -Continue Flexeril as needed - Anemia panel showing iron deficiency anemia, sent prescription for ferrous sulfate 325 mg twice daily -Follow-up in 1 week  Vitamin D deficiency Patient continues to have vitamin D deficiency.  She notes that  she was only given 3 weeks worth of her vitamin D medication -Refilled medication to be given 1 pill every week -Recheck vitamin D in 6 weeks  Prediabetes Hemoglobin A1c of 6.4 today.  Has previously had prediabetes and was on metformin but was taken off of it after her A1c dropped. -We will discuss this with patient further at recheck in 1 week -Can consider resuming metformin at that visit  Orders Placed This Encounter  Procedures  . Anemia panel  . CK  . Hemoglobin A1c    Smitty Cords, MD [place], PGY-3

## 2017-03-25 ENCOUNTER — Telehealth: Payer: Self-pay | Admitting: Family Medicine

## 2017-03-25 LAB — HEMOGLOBIN A1C
ESTIMATED AVERAGE GLUCOSE: 137 mg/dL
Hgb A1c MFr Bld: 6.4 % — ABNORMAL HIGH (ref 4.8–5.6)

## 2017-03-25 LAB — ANEMIA PANEL
FOLATE, HEMOLYSATE: 276 ng/mL
FOLATE, RBC: 789 ng/mL (ref >498)
Ferritin: 18 ng/mL (ref 15–150)
HEMATOCRIT: 35 % (ref 34.0–46.6)
Iron Saturation: 8 % — CL (ref 15–55)
Iron: 40 ug/dL (ref 27–159)
Retic Ct Pct: 1.7 % (ref 0.6–2.6)
TIBC: 491 ug/dL — AB (ref 250–450)
UIBC: 451 ug/dL — ABNORMAL HIGH (ref 131–425)
Vitamin B-12: 608 pg/mL (ref 232–1245)

## 2017-03-25 LAB — CK: CK TOTAL: 912 U/L — AB (ref 24–173)

## 2017-03-25 MED ORDER — FERROUS SULFATE 325 (65 FE) MG PO TABS: 325.0000 mg | ORAL_TABLET | Freq: Two times a day (BID) | ORAL | 3 refills | Status: DC

## 2017-03-25 NOTE — Telephone Encounter (Signed)
Called patient to discuss blood work. No answer. Left voicemail.   She has iron deficiency anemia as well as an elevated CK.  We will need to start iron supplementation as well as recheck her CK. I am going to send a rx for iron to her pharmacy.   Will try to call at a later time to tell her this.   Smitty Cords, MD [place], PGY-3

## 2017-03-26 ENCOUNTER — Emergency Department (HOSPITAL_COMMUNITY): Payer: Medicare Other

## 2017-03-26 ENCOUNTER — Emergency Department (HOSPITAL_COMMUNITY)
Admission: EM | Admit: 2017-03-26 | Discharge: 2017-03-27 | Disposition: A | Discharge status: Home / Self Care | Payer: Medicare Other | Attending: Emergency Medicine | Admitting: Emergency Medicine

## 2017-03-26 ENCOUNTER — Encounter (HOSPITAL_COMMUNITY): Payer: Self-pay

## 2017-03-26 ENCOUNTER — Other Ambulatory Visit: Payer: Self-pay

## 2017-03-26 DIAGNOSIS — R100 Acute abdomen: Secondary | ICD-10-CM | POA: Diagnosis not present

## 2017-03-26 DIAGNOSIS — Z79899 Other long term (current) drug therapy: Secondary | ICD-10-CM | POA: Insufficient documentation

## 2017-03-26 DIAGNOSIS — E119 Type 2 diabetes mellitus without complications: Secondary | ICD-10-CM | POA: Diagnosis not present

## 2017-03-26 DIAGNOSIS — R109 Unspecified abdominal pain: Secondary | ICD-10-CM | POA: Diagnosis not present

## 2017-03-26 DIAGNOSIS — I10 Essential (primary) hypertension: Secondary | ICD-10-CM | POA: Diagnosis not present

## 2017-03-26 DIAGNOSIS — E876 Hypokalemia: Secondary | ICD-10-CM | POA: Diagnosis not present

## 2017-03-26 DIAGNOSIS — M549 Dorsalgia, unspecified: Secondary | ICD-10-CM | POA: Diagnosis not present

## 2017-03-26 LAB — COMPREHENSIVE METABOLIC PANEL
ALT: 35 U/L (ref 14–54)
ANION GAP: 13 (ref 5–15)
AST: 56 U/L — ABNORMAL HIGH (ref 15–41)
Albumin: 4.5 g/dL (ref 3.5–5.0)
Alkaline Phosphatase: 92 U/L (ref 38–126)
BILIRUBIN TOTAL: 0.9 mg/dL (ref 0.3–1.2)
BUN: 23 mg/dL — AB (ref 6–20)
CO2: 27 mmol/L (ref 22–32)
Calcium: 9.8 mg/dL (ref 8.9–10.3)
Chloride: 96 mmol/L — ABNORMAL LOW (ref 101–111)
Creatinine, Ser: 1.29 mg/dL — ABNORMAL HIGH (ref 0.44–1.00)
GFR calc Af Amer: 52 mL/min — ABNORMAL LOW (ref >60)
GFR, EST NON AFRICAN AMERICAN: 45 mL/min — AB (ref >60)
Glucose, Bld: 108 mg/dL — ABNORMAL HIGH (ref 65–99)
POTASSIUM: 3.1 mmol/L — AB (ref 3.5–5.1)
Sodium: 136 mmol/L (ref 135–145)
TOTAL PROTEIN: 9.3 g/dL — AB (ref 6.5–8.1)

## 2017-03-26 LAB — CBC
HEMATOCRIT: 39.2 % (ref 36.0–46.0)
HEMOGLOBIN: 12.4 g/dL (ref 12.0–15.0)
MCH: 24.8 pg — ABNORMAL LOW (ref 26.0–34.0)
MCHC: 31.6 g/dL (ref 30.0–36.0)
MCV: 78.6 fL (ref 78.0–100.0)
Platelets: 299 10*3/uL (ref 150–400)
RBC: 4.99 MIL/uL (ref 3.87–5.11)
RDW: 16 % — AB (ref 11.5–15.5)
WBC: 10.6 10*3/uL — AB (ref 4.0–10.5)

## 2017-03-26 LAB — I-STAT BETA HCG BLOOD, ED (MC, WL, AP ONLY): I-stat hCG, quantitative: 7 m[IU]/mL — ABNORMAL HIGH (ref <5)

## 2017-03-26 LAB — CK: CK TOTAL: 798 U/L — AB (ref 38–234)

## 2017-03-26 LAB — LIPASE, BLOOD: Lipase: 56 U/L — ABNORMAL HIGH (ref 11–51)

## 2017-03-26 MED ORDER — SODIUM CHLORIDE 0.9 % IV BOLUS (SEPSIS)
500.0000 mL | Freq: Once | INTRAVENOUS | Status: AC
  Administered 2017-03-26: 500 mL via INTRAVENOUS

## 2017-03-26 MED ORDER — POTASSIUM CHLORIDE CRYS ER 20 MEQ PO TBCR
40.0000 meq | EXTENDED_RELEASE_TABLET | Freq: Once | ORAL | Status: AC
  Administered 2017-03-26: 40 meq via ORAL
  Filled 2017-03-26: qty 2

## 2017-03-26 MED ORDER — OXYCODONE-ACETAMINOPHEN 5-325 MG PO TABS
1.0000 | ORAL_TABLET | Freq: Once | ORAL | Status: AC
  Administered 2017-03-26: 1 via ORAL
  Filled 2017-03-26: qty 1

## 2017-03-26 MED ORDER — IOPAMIDOL (ISOVUE-300) INJECTION 61%
75.0000 mL | Freq: Once | INTRAVENOUS | Status: AC | PRN
  Administered 2017-03-26: 75 mL via INTRAVENOUS

## 2017-03-26 MED ORDER — POTASSIUM CHLORIDE ER 10 MEQ PO TBCR: 10.0000 meq | EXTENDED_RELEASE_TABLET | Freq: Every day | ORAL | 0 refills | Status: DC

## 2017-03-26 MED ORDER — FENTANYL CITRATE (PF) 100 MCG/2ML IJ SOLN
100.0000 ug | Freq: Once | INTRAMUSCULAR | Status: AC
  Administered 2017-03-26: 100 ug via INTRAVENOUS
  Filled 2017-03-26: qty 2

## 2017-03-26 NOTE — ED Provider Notes (Signed)
[facility] DEPT Provider Note   CSN: [id number] Arrival date & time: 03/26/17  1943     History   Chief Complaint Chief Complaint  Patient presents with  . Abdominal Pain    HPI Norma [name] is a 56 y.o. female.  HPI 56 year old female presents the emergency department after abdominal pain for several weeks and abdominal bloating.  She reports generalized cramping to multiple joints and extremities.  She was seen in primary care doctor's office on Thursday and has not received the results of the blood test.  She feels like her lower extremities are swollen.  She is recently started on Lasix by her cardiology team although she does not have a history of congestive heart failure per the patient.  Patient reports that she is gained approximately 20 pounds since July without significant changes in her diet.  She feels more swollen in her abdomen.  She is moving her bowels.  Denies urinary symptoms.  Denies nausea vomiting.  No significant change in her appetite.   Past Medical History:  Diagnosis Date  . Acid reflux   . Anxiety   . Arthritis   . Back pain   . Depression   . Diabetes mellitus without complication ([place])    on meds  . Headache    stress headaches, migraines at times  . Hypertension   . Irregular heart beat   . Left shoulder pain   . Shortness of breath dyspnea    with exertion  . Tachyarrhythmia   . Trigger point of thoracic region 03/22/2012    Patient Active Problem List   Diagnosis Date Noted  . Vitamin D deficiency 03/01/2017  . Anemia 03/01/2017  . Cough 01/11/2017  . Environmental allergies 11/03/2016  . Proteinuria 09/17/2014  . Flank pain, chronic 08/27/2014  . Other cyst of bone, right ankle and foot 06/21/2014  . Lumbar herniated disc 06/06/2014  . Depression   . Spinal stenosis, lumbar region, with neurogenic claudication 11/21/2013  . Muscle cramps 06/21/2013  . Right shoulder pain 04/18/2013  . Somatic  dysfunction - Osteopathic Findings 07/02/2012  . Rotator cuff tear, left 05/05/2012  . Decreased hearing 12/29/2011  . Hot flashes 06/30/2011  . Eczema, dyshidrotic 06/02/2010  . FIBROIDS, UTERUS 10/24/2009  . GERD 06/09/2009  . Chronic pain syndrome 10/04/2008  . HIP PAIN, LEFT, CHRONIC 08/16/2007  . Hepatitis C carrier ([place]) 03/21/2007  . Obesity 06/16/2006  . HYPERTENSION, BENIGN SYSTEMIC 06/16/2006    Past Surgical History:  Procedure Laterality Date  . ANKLE SURGERY    . LUMBAR LAMINECTOMY/DECOMPRESSION MICRODISCECTOMY Left 06/06/2014   Procedure: LUMBAR LAMINECTOMY/DECOMPRESSION MICRODISCECTOMY 1 LEVEL;  Surgeon: Newman Pies, MD;  Location: [facility];  Service: Neurosurgery;  Laterality: Left;  Left L5S1 microdiskectomy  . ROTATOR CUFF REPAIR Left 02/20/14  . TUBAL LIGATION      OB History    No data available       Home Medications    Prior to Admission medications   Medication Sig Start Date End Date Taking? Authorizing Provider  ARIPiprazole (ABILIFY) 2 MG tablet Take 2 mg by mouth daily.    [provider]  beta carotene w/minerals (OCUVITE) tablet Take 1 tablet by mouth daily.    [provider]  buPROPion (WELLBUTRIN XL) 300 MG 24 hr tablet Take 300 mg by mouth daily.    [provider]  cetirizine (ZYRTEC) 5 MG tablet Take 1 tablet (5 mg total) by mouth daily. 11/02/16   Burr Medico,  [name], MD  cyclobenzaprine (FLEXERIL) 10 MG tablet Take 1 tablet (10 mg total) by mouth 3 (three) times daily as needed for muscle spasms. 07/05/16   [name], MD  desonide (DESOWEN) 0.05 % cream Apply topically 2 (two) times daily. Can use for 7-10 days. 09/09/16   [name], MD  diclofenac sodium (VOLTAREN) 1 % GEL Apply 2 g topically 4 (four) times daily. 09/09/16   [name], MD  diltiazem (CARDIZEM SR) 60 MG 12 hr capsule Take 1 capsule (60 mg total) by mouth 2 (two) times daily. 09/04/14   [name], MD    esomeprazole (NEXIUM) 40 MG capsule Take 1 capsule (40 mg total) at bedtime by mouth. 02/24/17   [name], MD  ferrous sulfate 325 (65 FE) MG tablet Take 1 tablet (325 mg total) by mouth 2 (two) times daily with a meal. 03/25/17   [name], [name], MD  fish oil-omega-3 fatty acids 1000 MG capsule Take 2 g by mouth daily.    [provider]  guaiFENesin-codeine (ROBITUSSIN AC) 100-10 MG/5ML syrup Take 10 mLs by mouth 3 (three) times daily as needed for cough. May cause drowsiness or sedation! 01/11/17   [name], MD  hydrochlorothiazide (HYDRODIURIL) 25 MG tablet Take 1 tablet (25 mg total) by mouth daily. 11/02/16   Everrett Coombe, MD  HYDROcodone-acetaminophen (NORCO/VICODIN) 5-325 MG per tablet Take 1 tablet by mouth every 6 (six) hours as needed for moderate pain.  05/02/14   [provider]  hydrocortisone cream 0.5 % Apply 1 application topically 2 (two) times daily. Patient not taking: Reported on 01/11/2017 07/23/16   [name], MD  hydrOXYzine (ATARAX/VISTARIL) 10 MG tablet Take 10 mg by mouth 3 (three) times daily as needed for itching. Reported on 08/26/2015    [provider]  isosorbide mononitrate (IMDUR) 30 MG 24 hr tablet Take 30 mg by mouth daily.    [provider]  meloxicam (MOBIC) 15 MG tablet take 1 tablet by mouth once daily for 14 days then if needed for pain 01/11/17   [name], MD  metoprolol tartrate (LOPRESSOR) 25 MG tablet Take 1 tablet (25 mg total) by mouth 2 (two) times daily. 05/13/14   [name], MD  multivitamin-iron-minerals-folic acid (CENTRUM) chewable tablet Chew 1 tablet daily by mouth. 03/01/17   [name], MD  pregabalin (LYRICA) 100 MG capsule Take 1 capsule (100 mg total) by mouth 2 (two) times daily. 12/18/15   [name], MD  traZODone (DESYREL) 100 MG tablet Take 2 tablets (200 mg total) by mouth at bedtime. 07/23/16   [name], MD  Vitamin D, Ergocalciferol,  (DRISDOL) 50000 units CAPS capsule Take 1 capsule (50,000 Units total) by mouth every 7 (seven) days. 03/24/17   [name], MD    Family History Family History  Problem Relation Age of Onset  . Diabetes Brother   . Diabetes Maternal Aunt   . Diabetes Paternal Aunt   . Diabetes Maternal Grandmother   . Diabetes Maternal Grandfather   . Diabetes Paternal Grandmother   . Diabetes Paternal Grandfather   . Congestive Heart Failure Mother   . Hypertension Son     Social History Social History   Tobacco Use  . Smoking status: Never Smoker  . Smokeless tobacco: Never Used  Substance Use Topics  . Alcohol use: Yes    Comment: glass of wine occasionally  . Drug use: No  Comment: States no longer uses marijuana     Allergies   Soma [carisoprodol]; Nortriptyline; and Prednisone   Review of Systems Review of Systems  All other systems reviewed and are negative.    Physical Exam Updated Vital Signs BP (!) 128/91 (BP Location: Left Arm)   Pulse (!) 111   Temp 98.1 F (36.7 C) (Oral)   Resp 20   Ht 5\' 5"  (1.651 m)   Wt 120.2 kg (265 lb)   SpO2 99%   BMI 44.10 kg/m   Physical Exam  Constitutional: She is oriented to person, place, and time. She appears well-developed and well-nourished. No distress.  HENT:  Head: Normocephalic and atraumatic.  Eyes: EOM are normal.  Neck: Normal range of motion.  Cardiovascular: Normal rate, regular rhythm and normal heart sounds.  Pulmonary/Chest: Effort normal and breath sounds normal.  Abdominal: Soft. She exhibits distension. There is no tenderness.  Musculoskeletal: Normal range of motion.  Neurological: She is alert and oriented to person, place, and time.  Skin: Skin is warm and dry.  Psychiatric: She has a normal mood and affect. Judgment normal.  Nursing note and vitals reviewed.    ED Treatments / Results  Labs (all labs ordered are listed, but only abnormal results are displayed) Labs Reviewed  LIPASE,  BLOOD - Abnormal; Notable for the following components:      Result Value   Lipase 56 (*)    All other components within normal limits  COMPREHENSIVE METABOLIC PANEL - Abnormal; Notable for the following components:   Potassium 3.1 (*)    Chloride 96 (*)    Glucose, Bld 108 (*)    BUN 23 (*)    Creatinine, Ser 1.29 (*)    Total Protein 9.3 (*)    AST 56 (*)    GFR calc non Af Amer 45 (*)    GFR calc Af Amer 52 (*)    All other components within normal limits  CBC - Abnormal; Notable for the following components:   WBC 10.6 (*)    MCH 24.8 (*)    RDW 16.0 (*)    All other components within normal limits  CK - Abnormal; Notable for the following components:   Total CK 798 (*)    All other components within normal limits  I-STAT BETA HCG BLOOD, ED ([facility], WL, AP ONLY) - Abnormal; Notable for the following components:   I-stat hCG, quantitative 7.0 (*)    All other components within normal limits  URINALYSIS, ROUTINE W REFLEX MICROSCOPIC  PREGNANCY, URINE    EKG  EKG Interpretation None       Radiology Dg Abdomen Acute W/chest  Result Date: 03/26/2017 CLINICAL DATA:  Diffuse abdominal pain for a few weeks, now radiating to chest. EXAM: DG ABDOMEN ACUTE W/ 1V CHEST COMPARISON:  Chest radiograph March 17, 2017 FINDINGS: Cardiomediastinal silhouette is normal. Lungs are clear, no pleural effusions. No pneumothorax. Soft tissue planes and included osseous structures are nonacute. Degenerative change of thoracic spine. Bowel gas pattern is nondilated and nonobstructive. Phleboliths in the pelvis. No intra-abdominal mass effect, pathologic calcifications or free air. Soft tissue planes and included osseous structures are non-suspicious. Bilateral inferior pubic rami enthesopathy. IMPRESSION: Negative chest. Normal bowel gas pattern. Electronically Signed   By: Elon Alas M.D.   On: 03/26/2017 21:28    Procedures Procedures (including critical care time)  Medications Ordered  in ED Medications  sodium chloride 0.9 % bolus 500 mL (500 mLs Intravenous New Bag/Given 03/26/17 2122)  fentaNYL (SUBLIMAZE) injection 100 mcg (100 mcg Intravenous Given 03/26/17 2123)  oxyCODONE-acetaminophen (PERCOCET/ROXICET) 5-325 MG per tablet 1 tablet (1 tablet Oral Given 03/26/17 2229)  potassium chloride SA (K-DUR,KLOR-CON) CR tablet 40 mEq (40 mEq Oral Given 03/26/17 2229)  iopamidol (ISOVUE-300) 61 % injection 75 mL (75 mLs Intravenous Contrast Given 03/26/17 2235)     Initial Impression / Assessment and Plan / ED Course  I have reviewed the triage vital signs and the nursing notes.  Pertinent labs & imaging results that were available during my care of the patient were reviewed by me and considered in my medical decision making (see chart for details).     Mild hypokalemia.  Potassium replaced.  Pain treated.  CT scan pending to evaluate for cause of abdominal discomfort and abdominal swelling as well as weight gain.  No signs to suggest congestive heart failure.  Potassium replaced.  If CT imaging is within normal limits the patient can safely be discharged home with potassium supplementation and primary care follow-up.  Care to Dr Regenia Skeeter to await CT imaging  Final Clinical Impressions(s) / ED Diagnoses   Final diagnoses:  None    ED Discharge Orders    None       Jola Schmidt, MD 03/26/17 2314

## 2017-03-26 NOTE — ED Notes (Signed)
Bed: WA17 Expected date:  Expected time:  Means of arrival:  Comments: 56 f abdominal pain

## 2017-03-26 NOTE — ED Triage Notes (Signed)
States abdominal pain for a couple of weeks had testing done at doctors office and waiting on results states now pain radiates to chest area.

## 2017-03-26 NOTE — ED Notes (Signed)
Patient returned from xray.

## 2017-03-28 ENCOUNTER — Telehealth: Payer: Self-pay | Admitting: Internal Medicine

## 2017-03-28 NOTE — Telephone Encounter (Signed)
[name] After Hours Telephone Line   Patient calling reporting feeling of lumps on either side of her neck, and dizziness when standing up quickly. Was seen in ED on 12/08 for generalized cramping; found to have mild hypokalemia which was repleted. Did not report the dizziness or lumps at this time. Physical exam was negative for neck masses or lymphadenopathy. Patient denies falling or loss of consciousness. Does not live alone. Denies any other symptoms currently. Has appt with Dr. Juanito Doom next month but would like to be seen sooner. Dizziness sounds most consistent with orthostatic hypotension per patient's description of symptoms alone. As dizziness only occurring when standing up quickly, discussed standing up slowly to prevent this and prevent possible falls. Also reassured that patient does not live alone and has someone to assist her. Scheduled appt for patient tomorrow afternoon. Discussed reasons to present to ED sooner.   Adin Hector, MD, MPH PGY-3 [facility] Pager [phone]

## 2017-03-29 ENCOUNTER — Ambulatory Visit (INDEPENDENT_AMBULATORY_CARE_PROVIDER_SITE_OTHER): Payer: Medicare Other | Admitting: Student

## 2017-03-29 ENCOUNTER — Encounter: Payer: Self-pay | Admitting: Student

## 2017-03-29 ENCOUNTER — Other Ambulatory Visit: Payer: Self-pay

## 2017-03-29 VITALS — BP 125/80 | HR 107 | Temp 97.8°F | Ht 68.0 in | Wt 268.8 lb

## 2017-03-29 DIAGNOSIS — E559 Vitamin D deficiency, unspecified: Secondary | ICD-10-CM

## 2017-03-29 DIAGNOSIS — E876 Hypokalemia: Secondary | ICD-10-CM | POA: Diagnosis not present

## 2017-03-29 DIAGNOSIS — D509 Iron deficiency anemia, unspecified: Secondary | ICD-10-CM | POA: Insufficient documentation

## 2017-03-29 MED ORDER — VITAMIN D (ERGOCALCIFEROL) 1.25 MG (50000 UNIT) PO CAPS: 50000.0000 [IU] | ORAL_CAPSULE | ORAL | 0 refills | Status: DC

## 2017-03-29 NOTE — Assessment & Plan Note (Signed)
Hemoglobin A1c of 6.4 today.  Has previously had prediabetes and was on metformin but was taken off of it after her A1c dropped. -We will discuss this with patient further at recheck in 1 week -Can consider resuming metformin at that visit

## 2017-03-29 NOTE — Assessment & Plan Note (Signed)
Patient continues to be symptomatic.  This could be secondary to polymyositis (there was concern for this in the past when her CPK was high) vs dehydration vs iron deficiency.  TSH reassuringly normal.  BMP normal when checked last month. - CK still high at 900 when rechecked, asked patient to come in next week so we can check this again -Advised patient to keep well-hydrated -Continue Flexeril as needed - Anemia panel showing iron deficiency anemia, sent prescription for ferrous sulfate 325 mg twice daily -Follow-up in 1 week

## 2017-03-29 NOTE — Assessment & Plan Note (Signed)
Iron level less than 8.  Patient denies any hematochezia but she is due for colonoscopy.  She notes that she has a Cologuard at home that she just has not done yet.  She prefers to do Colo guard rather than colonoscopy - Iron supplementation - Recheck iron in 1 month -Advised patient to please use cold guard and submit this -Can consider doing FOBT and next visit

## 2017-03-29 NOTE — Assessment & Plan Note (Signed)
Patient continues to have vitamin D deficiency.  She notes that she was only given 3 weeks worth of her vitamin D medication -Refilled medication to be given 1 pill every week -Recheck vitamin D in 6 weeks

## 2017-03-29 NOTE — Patient Instructions (Signed)
It was great seeing you today! We have addressed the following issues today   Weakness/cramping: This is likely due to your low vitamin D level and potassium.  Continue taking your potassium.  We have send another prescription for vitamin D to your pharmacy.  Please make sure you take this once a week, not daily.  After you finish the course, you can take vitamin D 2000 international units daily.  This is available over-the-counter.  If we did any lab work today, and the results require attention, either me or my nurse will get in touch with you. If everything is normal, you will get a letter in mail and a message via . If you don't hear from [place] in two weeks, please give [place] a call. Otherwise, we look forward to seeing you again at your next visit. If you have any questions or concerns before then, please call the clinic at [phone].  Please bring all your medications to every doctors visit  Sign up for My Chart to have easy access to your labs results, and communication with your Primary care physician.    Please check-out at the front desk before leaving the clinic.    Take Care,   Dr. Cyndia Skeeters   Hypokalemia Hypokalemia means that the amount of potassium in the blood is lower than normal.Potassium is a chemical that helps regulate the amount of fluid in the body (electrolyte). It also stimulates muscle tightening (contraction) and helps nerves work properly.Normally, most of the body's potassium is inside of cells, and only a very small amount is in the blood. Because the amount in the blood is so small, minor changes to potassium levels in the blood can be life-threatening. What are the causes? This condition may be caused by:  Antibiotic medicine.  Diarrhea or vomiting. Taking too much of a medicine that helps you have a bowel movement (laxative) can cause diarrhea and lead to hypokalemia.  Chronic kidney disease (CKD).  Medicines that help the body get rid of excess fluid  (diuretics).  Eating disorders, such as bulimia.  Low magnesium levels in the body.  Sweating a lot.  What are the signs or symptoms? Symptoms of this condition include:  Weakness.  Constipation.  Fatigue.  Muscle cramps.  Mental confusion.  Skipped heartbeats or irregular heartbeat (palpitations).  Tingling or numbness.  How is this diagnosed? This condition is diagnosed with a blood test. How is this treated? Hypokalemia can be treated by taking potassium supplements by mouth or adjusting the medicines that you take. Treatment may also include eating more foods that contain a lot of potassium. If your potassium level is very low, you may need to get potassium through an IV tube in one of your veins and be monitored in the hospital. Follow these instructions at home:  Take over-the-counter and prescription medicines only as told by your health care provider. This includes vitamins and supplements.  Eat a healthy diet. A healthy diet includes fresh fruits and vegetables, whole grains, healthy fats, and lean proteins.  If instructed, eat more foods that contain a lot of potassium, such as: ? Nuts, such as peanuts and pistachios. ? Seeds, such as sunflower seeds and pumpkin seeds. ? Peas, lentils, and lima beans. ? Whole grain and bran cereals and breads. ? Fresh fruits and vegetables, such as apricots, avocado, bananas, cantaloupe, kiwi, oranges, tomatoes, asparagus, and potatoes. ? Orange juice. ? Tomato juice. ? Red meats. ? Yogurt.  Keep all follow-up visits as told by  your health care provider. This is important. Contact a health care provider if:  You have weakness that gets worse.  You feel your heart pounding or racing.  You vomit.  You have diarrhea.  You have diabetes (diabetes mellitus) and you have trouble keeping your blood sugar (glucose) in your target range. Get help right away if:  You have chest pain.  You have shortness of breath.  You  have vomiting or diarrhea that lasts for more than 2 days.  You faint. This information is not intended to replace advice given to you by your health care provider. Make sure you discuss any questions you have with your health care provider. Document Released: 04/05/2005 Document Revised: 11/22/2015 Document Reviewed: 11/22/2015 Elsevier Interactive Patient Education  2018 [employer].

## 2017-03-29 NOTE — Progress Notes (Signed)
Subjective:    Norma [name] is a 56 y.o. old female here for weakness  HPI Weakness: this has been going on for three weeks. She also reports muscle cramps all over, and mild frontal headache. She went to ED and was told her K was low. She was given KCl 10 mEq, She reports start taking this yesterday.  She has taken 20 mEq so far.  She is on hydrochlorothiazide for hypertension.  She says she was also started on furosemide for "fluid".  She is no longer taking the furosemide.   Patient was seen by PCP about a week ago.  At that time, and vitamin D level 7.7.  PCP sent a prescription for vitamin D 50,000 international unit weekly, #8.  She says she was only given 4 tablets.  She has been taking this daily.  She denies fever, unintentional weight loss or night sweat.  She denies chest pain, dyspnea orthopnea.  She reports using 3 pillows at baseline for acid reflux. PMH/Problem List: has FIBROIDS, UTERUS; Obesity; Chronic pain syndrome; HYPERTENSION, BENIGN SYSTEMIC; GERD; HIP PAIN, LEFT, CHRONIC; Hepatitis C carrier ([place]); Eczema, dyshidrotic; Hot flashes; Decreased hearing; Rotator cuff tear, left; Somatic dysfunction - Osteopathic Findings; Right shoulder pain; Muscle cramps; Spinal stenosis, lumbar region, with neurogenic claudication; Depression; Lumbar herniated disc; Other cyst of bone, right ankle and foot; Flank pain, chronic; Proteinuria; Environmental allergies; Cough; Vitamin D deficiency; and Anemia on their problem list.   has a past medical history of Acid reflux, Anxiety, Arthritis, Back pain, Depression, Diabetes mellitus without complication ([place]), Headache, Hypertension, Irregular heart beat, Left shoulder pain, Shortness of breath dyspnea, Tachyarrhythmia, and Trigger point of thoracic region (03/22/2012).  FH:  Family History  Problem Relation Age of Onset  . Diabetes Brother   . Diabetes Maternal Aunt   . Diabetes Paternal Aunt   . Diabetes Maternal Grandmother   . Diabetes  Maternal Grandfather   . Diabetes Paternal Grandmother   . Diabetes Paternal Grandfather   . Congestive Heart Failure Mother   . Hypertension Son     [facility] Social History   Tobacco Use  . Smoking status: Never Smoker  . Smokeless tobacco: Never Used  Substance Use Topics  . Alcohol use: Yes    Comment: glass of wine occasionally  . Drug use: No    Comment: States no longer uses marijuana    Review of Systems Review of systems negative except for pertinent positives and negatives in history of present illness above.     Objective:     Vitals:   03/29/17 1600  BP: 125/80  Pulse: (!) 107  Temp: 97.8 F (36.6 C)  TempSrc: Oral  SpO2: 97%  Weight: 268 lb 12.8 oz (121.9 kg)  Height: 5\' 8"  (1.727 m)   Body mass index is 40.87 kg/m.  Physical Exam  GEN: appears well, no apparent distress. Head: normocephalic and atraumatic  CVS: RRR, nl s1 & s2, no murmurs, no edema RESP: no IWOB, good air movement bilaterally, CTAB GI: Morbidly obese, BS present & normal, soft, NTND MSK: no focal tenderness or notable swelling SKIN: no apparent skin lesion ENDO: negative thyromegally NEURO: alert and oiented appropriately, no gross deficits  PSYCH: euthymic mood with congruent affect    Assessment and Plan:  1. Hypokalemia: Likely due to diuretics.  She was on hydrochlorothiazide and furosemide.  This could be 1 of the contributing factor to her weakness and cramping.  She is no longer on furosemide now.  She is taking her  potassium.  Encouraged her to continue taking until she completes.  We will check BMP and magnesium level as well.  Also gave handout about diets rich in potassium.  2. Vitamin D deficiency: another potential culprit for her weakness and cramping. Vitamin D level 7.7 about a week ago. She was prescribed vitamin D 50,000 international units daily.  She got 4 tablets likely because her insurance did not want to pay for 41-month supply. She says she has been taking one  caplet daily. She says she has already taken 4 of them.  I gave her another prescription for 50,000 international units weekly starting next week. I told her to take weekly, not daily. I also advised her to take vitamin D 2000 international units daily once she completes the weekly course.  Return if symptoms worsen or fail to improve.  Mercy Riding, MD 03/29/17 Pager: [phone]

## 2017-03-30 ENCOUNTER — Encounter: Payer: Self-pay | Admitting: Student

## 2017-03-30 ENCOUNTER — Other Ambulatory Visit: Payer: Self-pay | Admitting: *Deleted

## 2017-03-30 LAB — BASIC METABOLIC PANEL
BUN/Creatinine Ratio: 21 (ref 9–23)
BUN: 26 mg/dL — AB (ref 6–24)
CALCIUM: 9.2 mg/dL (ref 8.7–10.2)
CO2: 25 mmol/L (ref 20–29)
CREATININE: 1.23 mg/dL — AB (ref 0.57–1.00)
Chloride: 97 mmol/L (ref 96–106)
GFR calc Af Amer: 56 mL/min/{1.73_m2} — ABNORMAL LOW (ref >59)
GFR calc non Af Amer: 48 mL/min/{1.73_m2} — ABNORMAL LOW (ref >59)
GLUCOSE: 93 mg/dL (ref 65–99)
Potassium: 4.2 mmol/L (ref 3.5–5.2)
SODIUM: 137 mmol/L (ref 134–144)

## 2017-03-30 LAB — MAGNESIUM: MAGNESIUM: 2.1 mg/dL (ref 1.6–2.3)

## 2017-03-30 NOTE — Progress Notes (Signed)
Attempted to call patient to discuss about a BMP and magnesium level.  Will patient did not pick up the phone.  BMP and magnesium level normal except for mildly elevated serum creatinine, which has improved compared to her serum creatinine 5 days ago.  She can follow-up on this with her primary care doctor in a month or two.

## 2017-03-30 NOTE — Patient Outreach (Signed)
[facility]) Care Management  80/88/1103  Norma [name] [birth year] [phone]   RN Health Coach screening telephone call to patient.  [employer] compliance verified. RN Health Coach described the services available by [facility] Registered Nurse, Social Worker and Pharmacist. Per patient she is interested in the Registered Nurse services of having some one call her and the Pharmacist to look into helping her get her medications. Per patient she is unable to talk now and would like to call me back.  Plan: Patient took Health coach number and will call back to complete screening.  [facility] [phone]

## 2017-03-31 ENCOUNTER — Telehealth: Payer: Self-pay | Admitting: *Deleted

## 2017-03-31 NOTE — Telephone Encounter (Signed)
Pt lm on nurse line.  States that iron pills were never called in.    Contacted [facility], they were called in but that is a medication that her insurance will not cover as it is OTC.  LMOVM for pt to return call. [place], Salome Spotted, CMA

## 2017-04-04 ENCOUNTER — Other Ambulatory Visit: Payer: Self-pay | Admitting: *Deleted

## 2017-04-04 NOTE — Patient Outreach (Signed)
[facility]) Care Management  11/29/8869  Norma [name] [birth year] [id number]   RN Health Coach attempted #2 follow up outreach call to patient.  Patient was unavailable. HIPPA compliance voicemail message left with return callback number.  Plan: RN will call patient again within 14 days.  [place] Care Management [phone]

## 2017-04-04 NOTE — Patient Outreach (Signed)
[facility]) Care Management  51/88/4166  MAKYNZEE TIGGES [birth year] [id number]   Initial referral from [facility] ED list: This patient is not a high ED Utilizing but had returned my call . RN Health Coach received telephone call from patient.  Hipaa compliance verified. Per patient she would like educational information on Atrial fib, HTN and obesity. Per patient she lives alone and has no assistance Per patient she needs assistance with affording medications. Equipment patient has:  Scales, blood pressure cuff, dentures and eye glasses  Plan: Referral to telephonic case manager Referral to [place] Management [phone]

## 2017-04-06 ENCOUNTER — Ambulatory Visit: Payer: Self-pay | Admitting: *Deleted

## 2017-04-07 ENCOUNTER — Encounter: Payer: Self-pay | Admitting: Family Medicine

## 2017-04-07 ENCOUNTER — Telehealth: Payer: Self-pay | Admitting: *Deleted

## 2017-04-07 ENCOUNTER — Ambulatory Visit (INDEPENDENT_AMBULATORY_CARE_PROVIDER_SITE_OTHER): Payer: Medicare Other | Admitting: Family Medicine

## 2017-04-07 ENCOUNTER — Ambulatory Visit (HOSPITAL_COMMUNITY): Payer: Medicare Other | Attending: Family Medicine

## 2017-04-07 VITALS — BP 122/74 | HR 100 | Ht 68.0 in | Wt 270.0 lb

## 2017-04-07 DIAGNOSIS — R079 Chest pain, unspecified: Secondary | ICD-10-CM | POA: Diagnosis not present

## 2017-04-07 MED ORDER — NITROGLYCERIN 0.4 MG SL SUBL
0.4000 mg | SUBLINGUAL_TABLET | Freq: Once | SUBLINGUAL | Status: AC
  Administered 2017-04-07: 0.4 mg via SUBLINGUAL

## 2017-04-07 MED ORDER — ASPIRIN 325 MG PO TABS
325.0000 mg | ORAL_TABLET | Freq: Once | ORAL | Status: AC
  Administered 2017-04-07: 325 mg via ORAL

## 2017-04-07 NOTE — Telephone Encounter (Signed)
Patient left message on nurse line stating she's been having chest pain all day and wanted to know what PCP thought about that. Returned call. Patient states she has had chest pain since 9 am. Had left arm pain this AM only. Denies at present. Denies [facility] but is positive for headache. Does not check BP at home but states she has not missed any doses of her meds. Instructed patient to gor to ED to be evaluated. Patient is bringing her mom to appt in [place] clinic today at 3:30. Does not want to go to ED 2/2 wait time. SD appt given with Dr. Ky Barban at 4 pm. Hubbard Hartshorn, RN, BSN

## 2017-04-07 NOTE — Telephone Encounter (Signed)
Called patient. Asked her to leave her mother at home and we will get her rescheduled for another time. We will need Ms. Mehlberg to be a participant in the geriatric visit for her mother in the future and she cannot do that if she is having chest pain. Will see Ms. Pardon in the clinic today.   Smitty Cords, MD [place], PGY-3

## 2017-04-07 NOTE — Progress Notes (Signed)
   Subjective:    Patient ID: Norma [name] , female   DOB: 1960-09-03 , 56 y.o..   MRN: [id number]  HPI  Norma [name] is a 56 year old female past medical history of hypertension, hyperlipidemia, GERD, depression, iron deficiency anemia, vitamin D deficiency, prediabetes, chronic pain on norco here for  Chief Complaint  Patient presents with  . Chest Pain    1. Chest Pain:  Patient complains of chest pain/tightness. It started when she [place]. The pain starts substernally and radiates to the left and then to the back. Worse with movement and ambulation. Endorses fatigue. It has been intermittent every 10 minutes all day long. She is also endorsing headaches all week, frontal.  Patient rates pain as a 6/10 in intensity. Was 9/10 earlier when it first started Associated symptoms are dyspnea, dizziness, lightheadedness Aggravating factors are walking.   Alleviating factors are: laying down.  Stressed with her family situation, she is taking care of her mother and her grandchildren.  Patient's cardiac risk factors are advanced age (older than 33 for men, 31 for women), dyslipidemia, hypertension, obesity (BMI >= 30 kg/m2) and sedentary lifestyle.   Patient's risk factors for DVT/PE: none.  Previous cardiac testing: myoview stress test in June 2014 without evidence of ischemia.  Also endorsing muscle cramps in legs, back, and chest; feels tight. Mustard does help sometimes.   Review of Systems: Per HPI.   Social Hx:  reports that  has never smoked. she has never used smokeless tobacco.   Objective:   BP 122/74   Pulse 100   Ht 5\' 8"  (1.727 m)   Wt 270 lb (122.5 kg)   SpO2 96%   BMI 41.05 kg/m  Physical Exam  Gen: NAD, alert, cooperative with exam, well-appearing Cardiac: Regular rate and rhythm, normal S1/S2, no murmur, no edema, capillary refill brisk  Respiratory: Clear to auscultation bilaterally, no wheezes, non-labored  breathing Gastrointestinal: soft, non tender, non distended, bowel sounds present Skin: no rashes, normal turgor  Neurological: no gross deficits.  MSK: tender to palpation on sternum Psych: good insight, normal mood and affect  Assessment & Plan:  Chest pain No signs of STEMI/NSTEMI, EKG NSR without ST changes, mildly prolonged QTc 497. Vitals normal. Chest pain still  concerning for possible unstable angina given history. Heart score of 4. Other differentials include costochondritis vs stress. Discussed with patient that we would like to transfer her to ED for possible admission for ACS rule out. 325 mg of ASA and 0.4mg  of SL Nitro given. She stated that her demented mother is in the waiting room and she needs to take her home first. Patient states she will go to the ED after she drops her mother off.  Orders Placed This Encounter  Procedures  . EKG 12-Lead   Meds ordered this encounter  Medications  . aspirin tablet 325 mg  . nitroGLYCERIN (NITROSTAT) SL tablet 0.4 mg   Smitty Cords, MD [place], PGY-3

## 2017-04-07 NOTE — Patient Instructions (Signed)
Thank you for coming in today, it was so nice to see you! Today we talked about:    Chest pain: I am concerned about your chest pain. We need to make sure nothing is wrong with your heart.  I would like for you to go to the emergency department ASAP. You have been given nitroglycerin and aspirin while you were here  Sincerely,  Smitty Cords, MD

## 2017-04-07 NOTE — Assessment & Plan Note (Addendum)
No signs of STEMI/NSTEMI, EKG NSR without ST changes, mildly prolonged QTc 497. Vitals normal. Chest pain still  concerning for possible unstable angina given history. Heart score of 4. Other differentials include costochondritis vs stress. Discussed with patient that we would like to transfer her to ED for possible admission for ACS rule out. 325 mg of ASA and 0.4mg  of SL Nitro given. She stated that her demented mother is in the waiting room and she needs to take her home first. Patient states she will go to the ED after she drops her mother off.

## 2017-04-11 ENCOUNTER — Other Ambulatory Visit: Payer: Self-pay | Admitting: *Deleted

## 2017-04-11 NOTE — Patient Outreach (Addendum)
Norma [name]) Care Management  82/42/3536  SINDHU NGUYEN [birth year] [id number]   Telephone Assessment:  Outreach telephone call to patient and HIPAA verified with patient. RN CM discussed with patient about her concerns regarding her medical history. Patient denied having a history of Atrial Fibrillation. She doesn't remember talking with the Cardiologist about a history of Atrial Fibrillation. She acknowledged being diagnosed with a heart murmur. Per patient, she is being monitored by a Cardiologist every 6 to 12 months. Patient requested information about heart murmurs. Patient stated, she was prescribed Lasix weekly. She spoke about having generalized cramping, which has increased within the last few weeks. Patient's last Potassium level was 4.2 on 03/29/17. RN CM encouraged patient to contact the MD about her increased cramping and she agreed. Per EMR, patient weighs 270 pounds and she was referred to a Nurtritionist. Patient verbalized not being able to afford the foods recommended by the Nutritionist. [facility] services and benefits explained to patient. Patient agreed to [name] services.   Plan: RM CM will send successful outreach letter to patient with introductory [facility] welcome package. RN CM will send patient EMMI educational materials.  RN CM will notify [name] Case Management Assistant of case status. RN CM notified [facility] Case Management Assistant: patient agreed to services and case opened.  RN CM advised patient to contact RNCM for any needs or concerns. RN CM advised patient to alert MD for any changes in conditions.   [name], RN, BSN, MHA/MSL, [place] Telephonic Care Manager Coordinator [facility] Direct Phone: [phone] Cell Phone: [phone] Toll Free: [phone] Fax: [phone]

## 2017-04-13 ENCOUNTER — Encounter: Payer: Self-pay | Admitting: *Deleted

## 2017-04-13 ENCOUNTER — Telehealth: Payer: Self-pay | Admitting: Pharmacist

## 2017-04-13 NOTE — Patient Outreach (Signed)
[facility]) Care Management  [place]   02/63/7858  Norma [name] [birth year] [id number]  56 y.o. year old female referred to [place] for Medication Assistance (Pharmacy Telephone Outreach)  PMH s/f: HTN, hot flashes, GERD, ezcema, herniated disc, uterine fibroids, obesity, chronic pain syndrome, hepatitis C carrier, chest pain, muscle cramps, depression, prediabetes, flank pain (chronic), allergies, vitamin D deficiency, iron deficiency anemia  Patient with [place] advantage plan.    Was unable to reach patient via telephone today and have left HIPAA compliant voicemail asking patient to return my call (unsuccessful outreach #1).  Plan: Will followup within 3 days via telephone   Carlean Jews, Pharm.D., BCPS PGY2 Ambulatory Care Pharmacy Resident Phone: [phone]

## 2017-04-13 NOTE — Addendum Note (Signed)
Addended by: Deirdre Pippins E on: 04/13/2017 02:39 PM   Modules accepted: Orders

## 2017-04-13 NOTE — Patient Outreach (Signed)
[facility]) Care Management  [place]   85/05/7739  Norma [name] [birth year] [id number]  56 y.o. year old female referred to [place] for Medication Assistance (Pharmacy Telephone Outreach)  PMH s/f: HTN, hot flashes, GERD, ezcema, herniated disc, uterine fibroids, obesity, chronic pain syndrome, hepatitis C carrier, chest pain, muscle cramps, depression, prediabetes, flank pain (chronic), allergies, vitamin D deficiency, iron deficiency anemia  Patient with [place] advantage plan.   Subjective:   Patient confirms identity with HIPAA-identifiers x2 and gives verbal consent to speak over the phone about medications. Today, patient reports she has been doing well. Endorses continued difficulty with medication affordability. Reports that prescriptions each cost $1.25/90 day supply but sometimes she doesn't have the money. States that she has all of her medications at present. Willing to transfer prescriptions to mail order if they can fillat $0 copay. 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 Takes metoprolol once daily (forgot it was twice daily) and iron supplements twice weekly ("thought it was too much"). States she keeps all meds by her bed, doesn't have a pill box but is interested in one. Denies any adverse effects including constipation, chest pain, dizziness.  Reports adherence to vitamin D 50,000 units once weekly (wednesdays). Has run out of potassium 10 mEq tablets and has started taking over the counter potassium supplements. Has follow up appointment with Dr. Juanito Doom on 04/25/2016.     Objective:  Saw Dr. Cyndia Skeeters in 03/29/2017 for complaints of weakness, found to be hypokalemic and was continued on her potassium supplements as she had just started them. Also found to have misunderstood vitamin D 50,000 units directions and was taking once daily x4 days, was given new prescription and told to take once  weekly, then start vitamin D3 2000 units daily.    Saw Dr. Juanito Doom on 04/07/2017 for complaints of chest pain. She was given aspirin and sublingual nitroglycerin, EKG revealed no signs of STEMI/NSTEM, pt in normal sinus rhythm, QTc prolonged at 497. Patient was advised to go to emergency department for evaluation however she was unable to as she was caring for her Mother.   Encounter Medications: Outpatient Encounter Medications as of 04/13/2017  Medication Sig Note  . ARIPiprazole (ABILIFY) 2 MG tablet Take 2 mg by mouth daily. 09/09/2016: Takes M W F 2/2 drowsiness  . buPROPion (WELLBUTRIN XL) 300 MG 24 hr tablet Take 300 mg by mouth daily.   . cyclobenzaprine (FLEXERIL) 10 MG tablet Take 1 tablet (10 mg total) by mouth 3 (three) times daily as needed for muscle spasms.   . diclofenac sodium (VOLTAREN) 1 % GEL Apply 2 g topically 4 (four) times daily.   Marland Kitchen esomeprazole (NEXIUM) 40 MG capsule Take 1 capsule (40 mg total) at bedtime by mouth.   . ferrous sulfate 325 (65 FE) MG tablet Take 1 tablet (325 mg total) by mouth 2 (two) times daily with a meal. 04/13/2017: Twice a week   . fish oil-omega-3 fatty acids 1000 MG capsule Take 2 g by mouth daily.   . furosemide (LASIX) 40 MG tablet Take 40 mg by mouth once a week.   Marland Kitchen guaiFENesin-codeine (ROBITUSSIN AC) 100-10 MG/5ML syrup Take 10 mLs by mouth 3 (three) times daily as needed for cough. May cause drowsiness or sedation!   . hydrochlorothiazide (HYDRODIURIL) 25 MG tablet Take 1 tablet (25 mg total) by mouth daily.   Marland Kitchen HYDROcodone-acetaminophen (NORCO/VICODIN) 5-325 MG per tablet Take 1 tablet by  mouth every 6 (six) hours as needed for moderate pain.    . isosorbide mononitrate (IMDUR) 30 MG 24 hr tablet Take 30 mg by mouth daily.   . metoprolol tartrate (LOPRESSOR) 25 MG tablet Take 1 tablet (25 mg total) by mouth 2 (two) times daily. 09/09/2016: Takes once daily  . multivitamin-iron-minerals-folic acid (CENTRUM) chewable tablet Chew 1 tablet  daily by mouth.   . pregabalin (LYRICA) 100 MG capsule Take 1 capsule (100 mg total) by mouth 2 (two) times daily.   . traZODone (DESYREL) 100 MG tablet Take 2 tablets (200 mg total) by mouth at bedtime.   . Vitamin D, Ergocalciferol, (DRISDOL) 50000 units CAPS capsule Take 1 capsule (50,000 Units total) by mouth every 7 (seven) days.   . cetirizine (ZYRTEC) 5 MG tablet Take 1 tablet (5 mg total) by mouth daily. (Patient not taking: Reported on 04/13/2017)   . diltiazem (CARDIZEM SR) 60 MG 12 hr capsule Take 1 capsule (60 mg total) by mouth 2 (two) times daily. (Patient not taking: Reported on 04/13/2017) 04/13/2017: Out of this  . hydrOXYzine (ATARAX/VISTARIL) 10 MG tablet Take 10 mg by mouth 3 (three) times daily as needed for itching. Reported on 08/26/2015   . meloxicam (MOBIC) 15 MG tablet take 1 tablet by mouth once daily for 14 days then if needed for pain (Patient not taking: Reported on 04/13/2017)   . [DISCONTINUED] beta carotene w/minerals (OCUVITE) tablet Take 1 tablet by mouth daily.   . [DISCONTINUED] desonide (DESOWEN) 0.05 % cream Apply topically 2 (two) times daily. Can use for 7-10 days. (Patient not taking: Reported on 04/13/2017)   . [DISCONTINUED] hydrocortisone cream 0.5 % Apply 1 application topically 2 (two) times daily. (Patient not taking: Reported on 01/11/2017)   . [DISCONTINUED] potassium chloride (K-DUR) 10 MEQ tablet Take 1 tablet (10 mEq total) by mouth daily. (Patient not taking: Reported on 04/13/2017)    No facility-administered encounter medications on file as of 04/13/2017.     Fall/Depression Screening: Fall Risk  04/07/2017 03/29/2017 01/11/2017  Falls in the past year? No No No  Number falls in past yr: - - -  Injury with Fall? - - -  Comment - - -  Risk Factor Category  - - -  Risk for fall due to : - - -  Risk for fall due to: Comment - - -  Follow up - - -   PHQ 2/9 Scores 04/07/2017 03/29/2017 03/24/2017 01/11/2017 12/08/2016 11/23/2016 11/02/2016  PHQ -  2 Score 0 0 1 0 0 1 1  PHQ- 9 Score - - - - - - -  Exception Documentation - - - - - - -  Not completed - - - - - - -     Assessment: 56 yo female with [place] Medicare Advantage plan + Medicaid, not in donut hole, with low income subsidy/extra help in place. With patient's permission and her on the line, called mail order [facility] and had test prescription price checks run. All prescriptions came back at $1.25 unfortunately. Patient opts to have medications filled at local pharmacy instead. No other patient assistance options available to patient at this time. Patient reports she can pay for prescriptions at this time.   Plan: -Counseled on adherence to twice daily iron and metoprolol -Advised she adhere to Dr. Juliann Pares plan, stop the potassium supplementation, and keep follow up on 04/25/16 for lab recheck. Advised her to call doctor's office if cramps returned before then.  -Offered pill box -  I will drop off with doctor's office in anticipation of her 04/25/16 appointment  -Encouraged patient to keep appnt on 04/25/16 with Dr. Juanito Doom.  No further pharmacy needs identified. Will sign off. Please reconsult as needed.   Carlean Jews, Pharm.D., BCPS PGY2 Ambulatory Care Pharmacy Resident Phone: [phone]

## 2017-04-15 ENCOUNTER — Ambulatory Visit: Payer: Self-pay | Admitting: *Deleted

## 2017-04-18 ENCOUNTER — Ambulatory Visit: Payer: Self-pay | Admitting: *Deleted

## 2017-04-18 ENCOUNTER — Other Ambulatory Visit: Payer: Self-pay | Admitting: *Deleted

## 2017-04-18 NOTE — Patient Outreach (Signed)
[facility]) Care Management  29/57/4734  NANDANA KROLIKOWSKI [birth year] [id number]   Telephone Assessment   Outreach attempt  to patient. Patient stated, she was driving home and she requested a call back at a later time.   Plan: RN CM will contact patient within one week.   [name], RN, BSN, MHA/MSL, [place] Telephonic Care Manager Coordinator [facility] Direct Phone: [phone] Cell Phone: [phone] Toll Free: [phone] Fax: [phone]

## 2017-04-20 ENCOUNTER — Ambulatory Visit: Payer: Self-pay | Admitting: *Deleted

## 2017-04-22 ENCOUNTER — Ambulatory Visit: Payer: Self-pay | Admitting: *Deleted

## 2017-04-22 ENCOUNTER — Encounter: Payer: Self-pay | Admitting: *Deleted

## 2017-04-22 NOTE — Progress Notes (Signed)
This encounter was created in error - please disregard.

## 2017-04-25 ENCOUNTER — Ambulatory Visit: Payer: Self-pay | Admitting: *Deleted

## 2017-04-25 ENCOUNTER — Encounter: Payer: Self-pay | Admitting: Family Medicine

## 2017-04-25 ENCOUNTER — Other Ambulatory Visit: Payer: Self-pay | Admitting: *Deleted

## 2017-04-25 ENCOUNTER — Other Ambulatory Visit: Payer: Self-pay

## 2017-04-25 ENCOUNTER — Ambulatory Visit (INDEPENDENT_AMBULATORY_CARE_PROVIDER_SITE_OTHER): Payer: Medicare Other | Admitting: Family Medicine

## 2017-04-25 VITALS — BP 116/78 | HR 78 | Temp 98.1°F | Ht 68.0 in | Wt 259.0 lb

## 2017-04-25 DIAGNOSIS — R748 Abnormal levels of other serum enzymes: Secondary | ICD-10-CM

## 2017-04-25 DIAGNOSIS — Z1231 Encounter for screening mammogram for malignant neoplasm of breast: Secondary | ICD-10-CM

## 2017-04-25 DIAGNOSIS — E559 Vitamin D deficiency, unspecified: Secondary | ICD-10-CM | POA: Diagnosis not present

## 2017-04-25 DIAGNOSIS — R252 Cramp and spasm: Secondary | ICD-10-CM

## 2017-04-25 DIAGNOSIS — R079 Chest pain, unspecified: Secondary | ICD-10-CM

## 2017-04-25 DIAGNOSIS — Z1239 Encounter for other screening for malignant neoplasm of breast: Secondary | ICD-10-CM

## 2017-04-25 NOTE — Progress Notes (Signed)
Subjective:    Patient ID: Norma [name] , female   DOB: [birth year] , 57 y.o..   MRN: [id number]  HPI  Norma [name] is here for  Chief Complaint  Patient presents with  . muscle cramps    1. Muscle cramps: Legs, stomach, and feet since November 2018. She notes that she has been eating mustard which does provide relief. She notes that her cramps continue to last for about 5 minutes mostly at night and they go away.  She has been trying to keep hydrated. She is concerned her potassium may be running low. No difficulty with ambulation.  2.  Chest pain.  Patient was seen on December 20 for centralized chest pain; see previous note for further details .  Her EKG showed no ST changes however there was concern for possible unstable angina.  She was instructed to go to the emergency department immediately however she signed a AMA form and did not go.  She notes that her chest pain has resolved.  She does have a cardiologist that she has not followed up with yet  Review of Systems: Per HPI.   Past Medical History: Patient Active Problem List   Diagnosis Date Noted  . Iron deficiency anemia 03/29/2017  . Vitamin D deficiency 03/01/2017  . Anemia 03/01/2017  . Cough 01/11/2017  . Environmental allergies 11/03/2016  . Proteinuria 09/17/2014  . Flank pain, chronic 08/27/2014  . Other cyst of bone, right ankle and foot 06/21/2014  . Lumbar herniated disc 06/06/2014  . Depression   . Prediabetes 01/14/2014  . Spinal stenosis, lumbar region, with neurogenic claudication 11/21/2013  . Muscle cramps 06/21/2013  . Right shoulder pain 04/18/2013  . Somatic dysfunction - Osteopathic Findings 07/02/2012  . Rotator cuff tear, left 05/05/2012  . Chest pain 04/26/2012  . Decreased hearing 12/29/2011  . Hot flashes 06/30/2011  . Eczema, dyshidrotic 06/02/2010  . FIBROIDS, UTERUS 10/24/2009  . GERD 06/09/2009  . Chronic pain syndrome 10/04/2008  . HIP PAIN, LEFT, CHRONIC 08/16/2007  .  Hepatitis C carrier ([place]) 03/21/2007  . Obesity 06/16/2006  . HYPERTENSION, BENIGN SYSTEMIC 06/16/2006    Medications: reviewed   Social Hx:  reports that  has never smoked. she has never used smokeless tobacco.   Objective:   BP 116/78   Pulse 78   Temp 98.1 F (36.7 C) (Oral)   Ht 5\' 8"  (1.727 m)   Wt 259 lb (117.5 kg)   SpO2 96%   BMI 39.38 kg/m  Physical Exam  Gen: NAD, alert, cooperative with exam, well-appearing Psych: good insight, normal mood and affect  Results for orders placed or performed in visit on 04/25/17  CK  Result Value Ref Range   Total CK 633 (HH) 24 - 173 U/L  VITAMIN D 25 Hydroxy (Vit-D Deficiency, Fractures)  Result Value Ref Range   Vit D, 25-Hydroxy 28.1 (L) 30.0 - 100.0 ng/mL  Basic Metabolic Panel  Result Value Ref Range   Glucose 83 65 - 99 mg/dL   BUN 13 6 - 24 mg/dL   Creatinine, Ser 1.02 (H) 0.57 - 1.00 mg/dL   GFR calc non Af Amer 61 >59 mL/min/1.73   GFR calc Af Amer 70 >59 mL/min/1.73   BUN/Creatinine Ratio 13 9 - 23   Sodium 141 134 - 144 mmol/L   Potassium 3.6 3.5 - 5.2 mmol/L   Chloride 97 96 - 106 mmol/L   CO2 28 20 - 29 mmol/L   Calcium 9.3 8.7 -  10.2 mg/dL     Assessment & Plan:  Muscle cramps Unclear etiology still. Previously elevated CK. Rechecking today and again elevated to 633. No signs of rhabdomyolysis, normal TSH, not on any statin therapy. Wondering if this is some sort of autoimmune process. ?Polymyositis however muscle strength normal per last exams. Discussed with Dr. Mingo Amber and the plan is as follows: - Continue mustard use PRN - Keep well hydrated - Will refer to rheumatology for further assessment  Vitamin D deficiency Vitamin D improved to 28.8 after 50,000 units weekly. - Begin vitamin D 1000 units daily -Can recheck vitamin D in 6-8 weeks.  Chest pain Patient did not go to emergency department on December 20 when she came into the office for centralized chest pain concerning for unstable angina.   Her chest pain has since resolved.  Discussed with patient importance of following with her cardiologist Dr. Doylene Canard.  She states that she will call him today and schedule an appointment as soon as possible.  Orders Placed This Encounter  Procedures  . CK  . VITAMIN D 25 Hydroxy (Vit-D Deficiency, Fractures)  . Basic Metabolic Panel    Smitty Cords, MD [place], PGY-3

## 2017-04-25 NOTE — Patient Outreach (Signed)
[place] [facility]) Care Management  08/21/6566  Norma [name] [birth year] [id number]  Telephone Assessment  Outreach attempt #2 to patient.  No answer. RN CM left HIPAA compliant message along with contact info.   Plan: RN CM will contact patient within one week.   [name], RN, BSN, MHA/MSL, [place] Telephonic Care Manager Coordinator [facility] Direct Phone: [phone] Cell Phone: [phone] Toll Free: [phone] Fax: [phone]

## 2017-04-25 NOTE — Patient Instructions (Addendum)
Thank you for coming in today, it was so nice to see you! Today we talked about:    Muscle cramps: Continue the mustard as needed. We are going to recheck your potassium, Vitamin D, and muscle enzyme  Your A1C is 6.4. You have prediabetes. Continue to eat healthier and this should come down. We will recheck it in March  Take the iron pills that we talked about (ferrous sulfate)  Please schedule an appt with your cardiologist ASAP  Please follow up in 2 months. You can schedule this appointment at the front desk before you leave or call the clinic.  Bring in all your medications or supplements to each appointment for review.   If we ordered any tests today, you will be notified via telephone of any abnormalities. If everything is normal you will get a letter in the mail.   If you have any questions or concerns, please do not hesitate to call the office at [phone]. You can also message me directly via MyChart.   Sincerely,  Smitty Cords, MD

## 2017-04-26 LAB — BASIC METABOLIC PANEL
BUN / CREAT RATIO: 13 (ref 9–23)
BUN: 13 mg/dL (ref 6–24)
CO2: 28 mmol/L (ref 20–29)
CREATININE: 1.02 mg/dL — AB (ref 0.57–1.00)
Calcium: 9.3 mg/dL (ref 8.7–10.2)
Chloride: 97 mmol/L (ref 96–106)
GFR calc Af Amer: 70 mL/min/{1.73_m2} (ref >59)
GFR, EST NON AFRICAN AMERICAN: 61 mL/min/{1.73_m2} (ref >59)
Glucose: 83 mg/dL (ref 65–99)
POTASSIUM: 3.6 mmol/L (ref 3.5–5.2)
SODIUM: 141 mmol/L (ref 134–144)

## 2017-04-26 LAB — VITAMIN D 25 HYDROXY (VIT D DEFICIENCY, FRACTURES): Vit D, 25-Hydroxy: 28.1 ng/mL — ABNORMAL LOW (ref 30.0–100.0)

## 2017-04-26 LAB — CK: CK TOTAL: 633 U/L — AB (ref 24–173)

## 2017-04-29 ENCOUNTER — Ambulatory Visit: Payer: Self-pay | Admitting: *Deleted

## 2017-04-30 NOTE — Assessment & Plan Note (Signed)
Vitamin D improved to 28.8 after 50,000 units weekly. - Begin vitamin D 1000 units daily -Can recheck vitamin D in 6-8 weeks.

## 2017-04-30 NOTE — Assessment & Plan Note (Signed)
Unclear etiology still. Previously elevated CK. Rechecking today and again elevated to 633. No signs of rhabdomyolysis, normal TSH, not on any statin therapy. Wondering if this is some sort of autoimmune process. ?Polymyositis however muscle strength normal per last exams. Discussed with Dr. Mingo Amber and the plan is as follows: - Continue mustard use PRN - Keep well hydrated - Will refer to rheumatology for further assessment

## 2017-04-30 NOTE — Assessment & Plan Note (Signed)
Patient did not go to emergency department on December 20 when she came into the office for centralized chest pain concerning for unstable angina.  Her chest pain has since resolved.  Discussed with patient importance of following with her cardiologist Dr. Doylene Canard.  She states that she will call him today and schedule an appointment as soon as possible.

## 2017-05-02 ENCOUNTER — Other Ambulatory Visit: Payer: Self-pay | Admitting: *Deleted

## 2017-05-02 ENCOUNTER — Encounter: Payer: Self-pay | Admitting: *Deleted

## 2017-05-02 MED ORDER — VITAMIN D (CHOLECALCIFEROL) 25 MCG (1000 UT) PO CAPS: 1.0000 | ORAL_CAPSULE | Freq: Every day | ORAL | 1 refills | Status: DC

## 2017-05-02 NOTE — Patient Outreach (Signed)
[facility] Care Management  9/[birth year]/8366 Norma [name] [birth year] [id number]   Telephone Assessment  Outreach attempt to patient. HIPAA verified with patient. Patient confirmed having a recent MD appointment with her primary MD on 04/25/17. She stated, she hasn't had any emergency room visits. Per patient, she only had the emergency room visit due to the cramping in her legs. She doesn't enjoy going to the emergency room and prefers to visit with her primary MD. She reported, she is feeling much better and her cramps have decreased. Per EMR, patient's MD is concerned with her CK levels, which are elevated. Patient will be arranging an appointment with the Cardiologist in the near future. She reported having problems with her knees and shoulder. Patient verbalized having a rotator cuff injury and plans to have surgery within the next month or two. She stated, she lost weight by going to the gym and exercising. She has noticed increased pain in her shoulder and she wants to have the surgery immediately. She knows the surgery will prevent her from being able to exercise, however she is limited on activities. RN CM asked patient about any other concerns regarding health/medical conditions. Patient stated, she didn't have any questions or concerns at this time. RN CM encouraged patient to contact [facility] with any questions/concerns.   Plan: RN CM will notify [facility] Case Management Assistant regarding case closure.  RN CM advised patient to contact RNCM for any needs or concerns. RN CM advised patient to alert MD for any changes in conditions.  RN CM will send patient case closure letter.  [name], RN, BSN, MHA/MSL, [place] Telephonic Care Manager Coordinator [facility] Direct Phone: [phone] Cell Phone: [phone] Toll Free: [phone] Fax: [phone]'

## 2017-05-04 ENCOUNTER — Other Ambulatory Visit: Payer: Self-pay | Admitting: *Deleted

## 2017-05-11 DIAGNOSIS — M7541 Impingement syndrome of right shoulder: Secondary | ICD-10-CM | POA: Diagnosis not present

## 2017-05-11 DIAGNOSIS — M7521 Bicipital tendinitis, right shoulder: Secondary | ICD-10-CM | POA: Diagnosis not present

## 2017-05-11 DIAGNOSIS — G8918 Other acute postprocedural pain: Secondary | ICD-10-CM | POA: Diagnosis not present

## 2017-05-11 DIAGNOSIS — M19011 Primary osteoarthritis, right shoulder: Secondary | ICD-10-CM | POA: Diagnosis not present

## 2017-05-11 DIAGNOSIS — M75111 Incomplete rotator cuff tear or rupture of right shoulder, not specified as traumatic: Secondary | ICD-10-CM | POA: Diagnosis not present

## 2017-05-11 DIAGNOSIS — S46011A Strain of muscle(s) and tendon(s) of the rotator cuff of right shoulder, initial encounter: Secondary | ICD-10-CM | POA: Diagnosis not present

## 2017-05-11 DIAGNOSIS — M94211 Chondromalacia, right shoulder: Secondary | ICD-10-CM | POA: Diagnosis not present

## 2017-05-16 DIAGNOSIS — M5416 Radiculopathy, lumbar region: Secondary | ICD-10-CM | POA: Diagnosis not present

## 2017-05-16 DIAGNOSIS — Z9889 Other specified postprocedural states: Secondary | ICD-10-CM | POA: Diagnosis not present

## 2017-05-16 DIAGNOSIS — I10 Essential (primary) hypertension: Secondary | ICD-10-CM | POA: Diagnosis not present

## 2017-05-16 DIAGNOSIS — M5136 Other intervertebral disc degeneration, lumbar region: Secondary | ICD-10-CM | POA: Diagnosis not present

## 2017-05-18 DIAGNOSIS — M75121 Complete rotator cuff tear or rupture of right shoulder, not specified as traumatic: Secondary | ICD-10-CM | POA: Diagnosis not present

## 2017-05-18 DIAGNOSIS — M25511 Pain in right shoulder: Secondary | ICD-10-CM | POA: Diagnosis not present

## 2017-05-25 DIAGNOSIS — M25511 Pain in right shoulder: Secondary | ICD-10-CM | POA: Diagnosis not present

## 2017-05-25 DIAGNOSIS — M75121 Complete rotator cuff tear or rupture of right shoulder, not specified as traumatic: Secondary | ICD-10-CM | POA: Diagnosis not present

## 2017-05-28 ENCOUNTER — Other Ambulatory Visit: Payer: Self-pay | Admitting: Student

## 2017-05-28 DIAGNOSIS — M25511 Pain in right shoulder: Secondary | ICD-10-CM

## 2017-05-31 DIAGNOSIS — M25511 Pain in right shoulder: Secondary | ICD-10-CM | POA: Diagnosis not present

## 2017-05-31 DIAGNOSIS — M75121 Complete rotator cuff tear or rupture of right shoulder, not specified as traumatic: Secondary | ICD-10-CM | POA: Diagnosis not present

## 2017-06-07 DIAGNOSIS — M75121 Complete rotator cuff tear or rupture of right shoulder, not specified as traumatic: Secondary | ICD-10-CM | POA: Diagnosis not present

## 2017-06-07 DIAGNOSIS — M25511 Pain in right shoulder: Secondary | ICD-10-CM | POA: Diagnosis not present

## 2017-06-07 DIAGNOSIS — Z9889 Other specified postprocedural states: Secondary | ICD-10-CM | POA: Diagnosis not present

## 2017-06-08 ENCOUNTER — Encounter: Payer: Self-pay | Admitting: Family Medicine

## 2017-06-08 ENCOUNTER — Other Ambulatory Visit: Payer: Self-pay

## 2017-06-08 ENCOUNTER — Ambulatory Visit (INDEPENDENT_AMBULATORY_CARE_PROVIDER_SITE_OTHER): Payer: Medicare Other | Admitting: Family Medicine

## 2017-06-08 VITALS — BP 120/60 | HR 93 | Temp 97.8°F | Ht 68.0 in | Wt 258.8 lb

## 2017-06-08 DIAGNOSIS — M25511 Pain in right shoulder: Secondary | ICD-10-CM

## 2017-06-08 DIAGNOSIS — M25552 Pain in left hip: Secondary | ICD-10-CM

## 2017-06-08 DIAGNOSIS — R252 Cramp and spasm: Secondary | ICD-10-CM | POA: Diagnosis not present

## 2017-06-08 DIAGNOSIS — G8929 Other chronic pain: Secondary | ICD-10-CM

## 2017-06-08 DIAGNOSIS — M25551 Pain in right hip: Secondary | ICD-10-CM

## 2017-06-08 DIAGNOSIS — M546 Pain in thoracic spine: Secondary | ICD-10-CM | POA: Diagnosis not present

## 2017-06-08 MED ORDER — CYCLOBENZAPRINE HCL 10 MG PO TABS: 10.0000 mg | ORAL_TABLET | Freq: Every day | ORAL | 2 refills | Status: DC | PRN

## 2017-06-08 MED ORDER — TROLAMINE SALICYLATE 10 % EX CREA: 1.0000 "application " | TOPICAL_CREAM | CUTANEOUS | 0 refills | Status: DC | PRN

## 2017-06-08 MED ORDER — MELOXICAM 15 MG PO TABS: ORAL_TABLET | ORAL | 0 refills | Status: DC

## 2017-06-08 NOTE — Patient Instructions (Signed)
Thank you for coming in today, it was so nice to see you! Today we talked about:    Hip pain: Your hip pain is likely from arthritis.  I have ordered hip x-rays for you.  You can go to the first floor [facility] to have this done at any time.  I have also sent in a prescription for Aspercreme that you can apply to your hips if you have any pain.  You can also take Tylenol as needed.   If you have any questions or concerns, please do not hesitate to call the office at [phone]. You can also message me directly via MyChart.   Sincerely,  Smitty Cords, MD

## 2017-06-08 NOTE — Progress Notes (Signed)
Subjective:    Patient ID: Norma [name] , female   DOB: 1960-09-23 , 57 y.o..   MRN: [id number]  HPI  Norma [name] is a 57 yo with PMH of HTN, GERD, eczema, obesity, chronic pain, prediabetes, anemia, vit D deficiency here for  Chief Complaint  Patient presents with  . Hip Pain    Bilateral    1. Muscle cramps: Legs, stomach, and feet since November 2018. She notes that she has been eating mustard which does provide relief, taking vitamin D which has been helping her a lot.  She notes that she was called to schedule a rheumatology visit but notes that she cannot go because the rheumatologist is too far for her, she does not want to travel to [place].  2.  Bilateral hip pain; patient notes that for at least 5 years she has had hip pain.  She notes that at first it was worse in the left but now it is in both hips.  She notes that over the last year her pain has been progressively worse.  She notes that she feels like "bones feels small".  She has been told that she has osteoarthritis in the past and her knees and shoulders.  She notes that she can walk about 5-6 minutes but then feels like her hips are hurting more.  She is currently taking Percocet 5 for shoulder pain, she had her right rotator cuff repaired earlier this year.  She has tried Voltaren gel in the past which did help although she cannot afford this because her insurance does not cover it.  She is requesting another type of cream to help with this.   Review of Systems: Per HPI.    Medications: reviewed and updated Current Outpatient Medications  Medication Sig Dispense Refill  . ARIPiprazole (ABILIFY) 2 MG tablet Take 2 mg by mouth daily.    Marland Kitchen buPROPion (WELLBUTRIN XL) 300 MG 24 hr tablet Take 300 mg by mouth daily.    . cetirizine (ZYRTEC) 5 MG tablet Take 1 tablet (5 mg total) by mouth daily. (Patient not taking: Reported on 04/13/2017) 30 tablet 3  . cyclobenzaprine (FLEXERIL) 10 MG tablet Take 1 tablet (10 mg  total) by mouth daily as needed for muscle spasms. 30 tablet 2  . diclofenac sodium (VOLTAREN) 1 % GEL Apply 2 g topically 4 (four) times daily. 100 g 3  . diltiazem (CARDIZEM SR) 60 MG 12 hr capsule Take 1 capsule (60 mg total) by mouth 2 (two) times daily. (Patient not taking: Reported on 04/13/2017) 180 capsule 1  . esomeprazole (NEXIUM) 40 MG capsule Take 1 capsule (40 mg total) at bedtime by mouth. 30 capsule 3  . ferrous sulfate 325 (65 FE) MG tablet Take 1 tablet (325 mg total) by mouth 2 (two) times daily with a meal. 60 tablet 3  . fish oil-omega-3 fatty acids 1000 MG capsule Take 2 g by mouth daily.    . furosemide (LASIX) 40 MG tablet Take 40 mg by mouth once a week.    Marland Kitchen guaiFENesin-codeine (ROBITUSSIN AC) 100-10 MG/5ML syrup Take 10 mLs by mouth 3 (three) times daily as needed for cough. May cause drowsiness or sedation! 120 mL 0  . hydrochlorothiazide (HYDRODIURIL) 25 MG tablet Take 1 tablet (25 mg total) by mouth daily. 90 tablet 3  . HYDROcodone-acetaminophen (NORCO/VICODIN) 5-325 MG per tablet Take 1 tablet by mouth every 6 (six) hours as needed for moderate pain.   0  . hydrOXYzine (ATARAX/VISTARIL)  10 MG tablet Take 10 mg by mouth 3 (three) times daily as needed for itching. Reported on 08/26/2015    . isosorbide mononitrate (IMDUR) 30 MG 24 hr tablet Take 30 mg by mouth daily.    . meloxicam (MOBIC) 15 MG tablet take 1 tablet by mouth once daily for 7 days then if needed for pain 30 tablet 0  . metoprolol tartrate (LOPRESSOR) 25 MG tablet Take 1 tablet (25 mg total) by mouth 2 (two) times daily. 180 tablet 3  . multivitamin-iron-minerals-folic acid (CENTRUM) chewable tablet Chew 1 tablet daily by mouth. 90 tablet 3  . pregabalin (LYRICA) 100 MG capsule Take 1 capsule (100 mg total) by mouth 2 (two) times daily. 60 capsule 5  . traZODone (DESYREL) 100 MG tablet Take 2 tablets (200 mg total) by mouth at bedtime. 180 tablet 0  . trolamine salicylate (ASPERCREME/ALOE) 10 % cream Apply  1 application topically as needed for muscle pain. 85 g 0  . Vitamin D, Cholecalciferol, 1000 units CAPS Take 1 tablet by mouth daily. 90 capsule 1  . Vitamin D, Ergocalciferol, (DRISDOL) 50000 units CAPS capsule Take 1 capsule (50,000 Units total) by mouth every 7 (seven) days. 8 capsule 0   No current facility-administered medications for this visit.     Social Hx:  reports that  has never smoked. she has never used smokeless tobacco.   Objective:   BP 120/60   Pulse 93   Temp 97.8 F (36.6 C)   Ht 5\' 8"  (1.727 m)   Wt 258 lb 12.8 oz (117.4 kg)   SpO2 98%   BMI 39.35 kg/m  Physical Exam  Gen: NAD, alert, cooperative with exam, well-appearing MSK:  Hips: Pelvic alignment unremarkable to inspection and palpation. ROM: Full in all directions Standing hip rotation and gait without unsteadiness. Greater trochanter without tenderness to palpation. 5/5 strength in both bilateral lower extremities Pain with FABER on the right and FADIR on the left. No SI joint tenderness and normal minimal SI movement.   Assessment & Plan:   1. Hip pain, bilateral: Likely OA based on being overweight and + FADIR and FABER. No signs of septic joint.  - DG HIP UNILAT WITH PELVIS 2-3 VIEWS LEFT; Future - DG HIP UNILAT WITH PELVIS 2-3 VIEWS RIGHT; Future - cyclobenzaprine (FLEXERIL) 10 MG tablet; Take 1 tablet (10 mg total) by mouth daily as needed for muscle spasms.  Dispense: 30 tablet; Refill: 2 - meloxicam (MOBIC) 15 MG tablet; take 1 tablet by mouth once daily for 7 days then if needed for pain  Dispense: 30 tablet; Refill: 0 - Aspercreme per patient request  2.  Muscle cramps; much improved since taking Vit D. Of note patient was referred to rheumatology for elevated CK but she did not schedule appointment as she states she did not want to drive to the rheumatology office 20 minutes away from [place] to monitor -Continue vitamin D supplementation   Orders Placed This Encounter   Procedures  . DG HIP UNILAT WITH PELVIS 2-3 VIEWS LEFT    Standing Status:   Future    Standing Expiration Date:   08/07/2018    Order Specific Question:   Reason for Exam (SYMPTOM  OR DIAGNOSIS REQUIRED)    Answer:   hip pain    Order Specific Question:   Is patient pregnant?    Answer:   No    Order Specific Question:   Preferred imaging location?    Answer:   [facility]    Order Specific Question:   Radiology Contrast Protocol - do NOT remove file path    Answer:   \\[email or web address]\DXFluoroContrastProtocols.pdf  . DG HIP UNILAT WITH PELVIS 2-3 VIEWS RIGHT    Standing Status:   Future    Standing Expiration Date:   08/07/2018    Order Specific Question:   Reason for Exam (SYMPTOM  OR DIAGNOSIS REQUIRED)    Answer:   hip pain    Order Specific Question:   Is patient pregnant?    Answer:   No    Order Specific Question:   Preferred imaging location?    Answer:   [facility]    Order Specific Question:   Radiology Contrast Protocol - do NOT remove file path    Answer:   \\[email or web address]\Radiant\DXFluoroContrastProtocols.pdf   Meds ordered this encounter  Medications  . cyclobenzaprine (FLEXERIL) 10 MG tablet    Sig: Take 1 tablet (10 mg total) by mouth daily as needed for muscle spasms.    Dispense:  30 tablet    Refill:  2  . trolamine salicylate (ASPERCREME/ALOE) 10 % cream    Sig: Apply 1 application topically as needed for muscle pain.    Dispense:  85 g    Refill:  0  . meloxicam (MOBIC) 15 MG tablet    Sig: take 1 tablet by mouth once daily for 7 days then if needed for pain    Dispense:  30 tablet    Refill:  0    Smitty Cords, MD [place], PGY-3

## 2017-06-09 DIAGNOSIS — M75121 Complete rotator cuff tear or rupture of right shoulder, not specified as traumatic: Secondary | ICD-10-CM | POA: Diagnosis not present

## 2017-06-09 DIAGNOSIS — M25511 Pain in right shoulder: Secondary | ICD-10-CM | POA: Diagnosis not present

## 2017-06-14 ENCOUNTER — Other Ambulatory Visit: Payer: Self-pay

## 2017-06-14 DIAGNOSIS — M75121 Complete rotator cuff tear or rupture of right shoulder, not specified as traumatic: Secondary | ICD-10-CM | POA: Diagnosis not present

## 2017-06-14 DIAGNOSIS — M25511 Pain in right shoulder: Secondary | ICD-10-CM | POA: Diagnosis not present

## 2017-06-14 DIAGNOSIS — M549 Dorsalgia, unspecified: Secondary | ICD-10-CM

## 2017-06-14 MED ORDER — TRAZODONE HCL 100 MG PO TABS: 200.0000 mg | ORAL_TABLET | Freq: Every day | ORAL | 0 refills | Status: DC

## 2017-06-21 DIAGNOSIS — M179 Osteoarthritis of knee, unspecified: Secondary | ICD-10-CM | POA: Diagnosis not present

## 2017-06-22 DIAGNOSIS — M75121 Complete rotator cuff tear or rupture of right shoulder, not specified as traumatic: Secondary | ICD-10-CM | POA: Diagnosis not present

## 2017-06-22 DIAGNOSIS — M25511 Pain in right shoulder: Secondary | ICD-10-CM | POA: Diagnosis not present

## 2017-06-28 ENCOUNTER — Telehealth: Payer: Self-pay | Admitting: Family Medicine

## 2017-06-28 ENCOUNTER — Ambulatory Visit (HOSPITAL_COMMUNITY)
Admission: RE | Admit: 2017-06-28 | Discharge: 2017-06-28 | Disposition: A | Discharge status: Home / Self Care | Payer: Medicare Other | Source: Ambulatory Visit | Attending: Family Medicine | Admitting: Family Medicine

## 2017-06-28 ENCOUNTER — Other Ambulatory Visit: Payer: Self-pay | Admitting: Family Medicine

## 2017-06-28 DIAGNOSIS — Z9889 Other specified postprocedural states: Secondary | ICD-10-CM | POA: Diagnosis not present

## 2017-06-28 DIAGNOSIS — M25511 Pain in right shoulder: Secondary | ICD-10-CM | POA: Diagnosis not present

## 2017-06-28 DIAGNOSIS — M25551 Pain in right hip: Secondary | ICD-10-CM | POA: Diagnosis not present

## 2017-06-28 DIAGNOSIS — M160 Bilateral primary osteoarthritis of hip: Secondary | ICD-10-CM | POA: Diagnosis not present

## 2017-06-28 DIAGNOSIS — M25552 Pain in left hip: Secondary | ICD-10-CM | POA: Diagnosis not present

## 2017-06-28 DIAGNOSIS — M75121 Complete rotator cuff tear or rupture of right shoulder, not specified as traumatic: Secondary | ICD-10-CM | POA: Diagnosis not present

## 2017-06-29 ENCOUNTER — Telehealth: Payer: Self-pay | Admitting: Family Medicine

## 2017-06-29 NOTE — Telephone Encounter (Signed)
Called patient back to let her know she has arthritis in both of her hips. No answer. Left voicemail.   Smitty Cords, MD [place], PGY-3

## 2017-07-05 DIAGNOSIS — M25511 Pain in right shoulder: Secondary | ICD-10-CM | POA: Diagnosis not present

## 2017-07-05 DIAGNOSIS — M75121 Complete rotator cuff tear or rupture of right shoulder, not specified as traumatic: Secondary | ICD-10-CM | POA: Diagnosis not present

## 2017-07-06 ENCOUNTER — Encounter: Payer: Self-pay | Admitting: Student in an Organized Health Care Education/Training Program

## 2017-07-06 ENCOUNTER — Other Ambulatory Visit: Payer: Self-pay

## 2017-07-06 ENCOUNTER — Ambulatory Visit (INDEPENDENT_AMBULATORY_CARE_PROVIDER_SITE_OTHER): Payer: Medicare Other | Admitting: Student in an Organized Health Care Education/Training Program

## 2017-07-06 DIAGNOSIS — M722 Plantar fascial fibromatosis: Secondary | ICD-10-CM | POA: Diagnosis not present

## 2017-07-06 MED ORDER — DICLOFENAC SODIUM 1 % TD GEL: 2.0000 g | Freq: Two times a day (BID) | TRANSDERMAL | 1 refills | Status: DC | PRN

## 2017-07-06 NOTE — Patient Instructions (Signed)
It was a pleasure seeing you today in our clinic. Today we discussed heel pain.   Please try rest, stretches, and ice as we discussed. Diclofenac gel was sent to your pharmacy, ibuprofen may help with your symptoms as well.  Our clinic's number is [phone]. Please call with questions or concerns about what we discussed today.  Be well, Dr. Isaiah Blakes FASCIITIS OVERVIEW Plantar fasciitis is one of the most common causes of foot pain in adults. Plantar fasciitis is caused by a strain of the ligaments in an area of the foot called the plantar fascia (figure 1). The plantar fascia (pronounced FASH-uh) is a thick piece of tissue with long fibers that starts at the heel bone and fans out along the under surface of the foot to the toes. The fascia provides support as the toes bear the body's weight when the heel rises during walking. Running, jumping, or standing for long periods of time can strain the plantar fascia. Most people with plantar fasciitis notice improvement in symptoms with or without conservative treatment such as rest, icing, and stretching. Most people are pain-free within a year. PLANTAR FASCIITIS SYMPTOMS The most common symptom of plantar fasciitis is pain beneath the heel and sole of the foot. The pain is often worst when stepping onto the foot, particularly when first getting out of bed in the morning or when getting up after being seated for some time. You may have pain in one or both of your feet. PLANTAR FASCIITIS RISK FACTORS Plantar fasciitis is more likely to occur in people whose lifestyle or occupation causes repetitive impact to the heel. Activities such as running, marching, or dancing may trigger or worsen symptoms. Possible other factors that increase the risk of plantar fasciitis include obesity, prolonged standing, and limited ankle flexibility. Plantar fasciitis occurs more frequently among runners. Although evidence is limited, possible factors that increase  the risk in this group include: ? Excessive training (particularly a sudden increase in the distance run) ? Improper running shoes ? Running on unyielding surfaces ? Prolonged standing or walking on hard surfaces ? Flat feet ? High arches  Plantar fasciitis usually occurs in people without underlying medical problems, but it can be associated with other rheumatic disorders such as ankylosing spondylitis or psoriatic arthritis. (See "Patient education: Axial spondyloarthritis, including ankylosing spondylitis (Beyond the Basics)" and "Patient education: Psoriatic arthritis (Beyond the Basics)".) PLANTAR FASCIITIS TESTS To diagnose plantar fasciitis, a health care provider will take a medical history and examine your feet to locate painful areas. This involves holding your foot in a flexed position with one hand, and using the other hand to press on different parts of your sole (where the plantar fascia is located). It is important to tell your provider if you have noticed pain or tenderness in other areas not found during the exam. If you have typical symptoms of plantar fasciitis, then no X-rays or other tests are required. This is the case for most people. In some instances, depending upon the nature and severity of pain as well as other individual factors, your provider may recommend X-rays to determine if another issue (such as a fracture) is causing your pain. PLANTAR FASCIITIS TREATMENT Conservative treatment of plantar fasciitis - Plantar fasciitis is usually treated conservatively (meaning without surgery or other invasive procedures). However, many commonly used treatments have not been proven to improve the symptoms of plantar fasciitis. Commonly used treatments for plantar fasciitis include the following: Rest - Limiting athletic activities and getting  extra rest may help to relieve your symptoms. If possible, avoid excessive and repetitive heel impact from jumping, dancing, and  distance running. A complete lack of physical activity, however, is not recommended, as this can lead to stiffening and a return of pain. Icing - Applying ice to the area, for example for 20 minutes up to four times daily, may relieve pain. Ice and massage may also be used before exercise. Stretching - Stretching exercises may be helpful. Home exercises include the calf-plantar fascia stretch (picture 1), foot/ankle circles (picture 2), toe curls (picture 3), and toe towel curls (picture 4). Go slowly and be careful when you start new exercises to avoid causing more pain. Pain medication - A clinician may recommend a short course of a nonsteroidal antiinflammatory drug (NSAID) such as ibuprofen (sample brand names: Advil, Motrin) or naproxen (sample brand name: Aleve) to relieve pain. However, these medications have many possible side effects, and it is important to weigh the potential risks and benefits. (See "Patient education: Nonsteroidal antiinflammatory drugs (NSAIDs) (Beyond the Basics)".) Protective footwear - It may help to wear athletic shoes, arch-supporting shoes (particularly those with an extra-long counter, which is the firm part of the shoe that surrounds the heel), or shoes with rigid shanks (usually a metal insert in the sole of the shoe). Cushion-soled shoes with gel pad inserts or heel cups may provide temporary pain relief. Silicone inserts have been found to provide better support than felt pads or rubber heel cups. Magnetic insoles have not been found to provide any additional benefit. Although unproven, splints may be helpful when worn overnight to position the foot and heel to provide pain relief and a gentle stretch. You can usually purchase these splints in pharmacies that carry orthopedic supplies. People who work or live in buildings with concrete floors should wear shoes with extra cushioning. Wearing slippers or going barefoot may cause your symptoms to get worse or return, even  if your floors are carpeted. Thus, it's a good idea to put on a supportive shoe or sandal even before you step out of bed in the morning.

## 2017-07-06 NOTE — Assessment & Plan Note (Signed)
Symptoms are most consistent with plantar fasciitis.  No evidence of bony injury, Morton's neuroma, or metatarsalgia. -Continue supportive footwear -Stretches, ruling out foot with a tennis ball advised -Rest, elevation -Ice -NSAIDs as needed for pain, patient has had success with diclofenac gel in the past so this was reordered. -Offered steroid injection, patient is not interested in this at this time -Follow-up if symptoms worsen or fail to improve, it may take several weeks for her to feel any improvement in the symptoms

## 2017-07-06 NOTE — Progress Notes (Signed)
   CC: Right heel pain  HPI: Norma [name] is a 57 y.o. female who presents to [facility] today with right heel pain of one week duration.   Right heel pain  - Pain started one week ago, and is noted on dorsal aspect of foot next to the heel and over the heel. - pain is worse with walking around a lot. She has been on her feet more over the past week due to her mother being in the hospital. - pain is improved with rest - She has tried gel pads in heels, which have not helped - She has taken percocet from back pain with some relief of pain - She has had foot pain in the past, however reports this is different from neuropathy symptoms - no fevers - no swelling, redness of the area, no sores in the area and she is not diabetic  Review of Symptoms:  See HPI for ROS.   CC, SH/smoking status, and VS noted.  Objective: BP 102/64   Pulse 76   Temp 98.2 F (36.8 C) (Oral)   Wt 254 lb (115.2 kg)   BMI 38.62 kg/m  GEN: NAD, alert, cooperative, and pleasant. FOOT: RLE ttp over dorsal aspect of foot, particularly close to heel. No pain over toes, no ventral foot pain. SKIN: warm and dry, no rashes or lesions NEURO:peripheral sensation intact, normal gait PSYCH: AAOx3, appropriate affect  Assessment and plan:  Plantar fasciitis of right foot Symptoms are most consistent with plantar fasciitis.  No evidence of bony injury, Morton's neuroma, or metatarsalgia. -Continue supportive footwear -Stretches, ruling out foot with a tennis ball advised -Rest, elevation -Ice -NSAIDs as needed for pain, patient has had success with diclofenac gel in the past so this was reordered. -Offered steroid injection, patient is not interested in this at this time -Follow-up if symptoms worsen or fail to improve, it may take several weeks for her to feel any improvement in the symptoms   Meds ordered this encounter  Medications  . diclofenac sodium (VOLTAREN) 1 % GEL    Sig: Apply 2 g topically 2 (two) times  daily as needed (heel pain).    Dispense:  100 g    Refill:  1    Everrett Coombe, MD,MS,  PGY2 07/06/2017 5:00 PM

## 2017-07-12 DIAGNOSIS — M75121 Complete rotator cuff tear or rupture of right shoulder, not specified as traumatic: Secondary | ICD-10-CM | POA: Diagnosis not present

## 2017-07-12 DIAGNOSIS — M25511 Pain in right shoulder: Secondary | ICD-10-CM | POA: Diagnosis not present

## 2017-07-12 DIAGNOSIS — M722 Plantar fascial fibromatosis: Secondary | ICD-10-CM | POA: Diagnosis not present

## 2017-07-13 ENCOUNTER — Telehealth: Payer: Self-pay | Admitting: Family Medicine

## 2017-07-13 NOTE — Telephone Encounter (Signed)
Pt is calling because the Lyrica is not helping the pain.She was wondering if she can increase the dosage? If not could she try Gabapentin. Please call her to discuss. jw

## 2017-07-15 NOTE — Telephone Encounter (Addendum)
I would like for a provider to examine her and speak in person in regards to her pain. White team please call her and help schedule an appointment so we can help her. Thank you.   Smitty Cords, MD [place], PGY-3

## 2017-07-18 ENCOUNTER — Telehealth: Payer: Self-pay | Admitting: Family Medicine

## 2017-07-18 DIAGNOSIS — M1990 Unspecified osteoarthritis, unspecified site: Secondary | ICD-10-CM | POA: Diagnosis not present

## 2017-07-18 DIAGNOSIS — R252 Cramp and spasm: Secondary | ICD-10-CM | POA: Diagnosis not present

## 2017-07-18 NOTE — Telephone Encounter (Signed)
Pt is having severe cramping in the feet and legs. She wants to know what she could do for it. Please advise

## 2017-07-19 DIAGNOSIS — M25511 Pain in right shoulder: Secondary | ICD-10-CM | POA: Diagnosis not present

## 2017-07-19 DIAGNOSIS — M75121 Complete rotator cuff tear or rupture of right shoulder, not specified as traumatic: Secondary | ICD-10-CM | POA: Diagnosis not present

## 2017-07-19 NOTE — Telephone Encounter (Signed)
Contacted pt and put her on a same day with Dr. Criss Rosales for 07/20/17. Norma [name], Norma [name] D, [place]

## 2017-07-20 ENCOUNTER — Ambulatory Visit: Payer: Medicare Other | Admitting: Family Medicine

## 2017-07-21 DIAGNOSIS — M25511 Pain in right shoulder: Secondary | ICD-10-CM | POA: Diagnosis not present

## 2017-07-21 DIAGNOSIS — M75121 Complete rotator cuff tear or rupture of right shoulder, not specified as traumatic: Secondary | ICD-10-CM | POA: Diagnosis not present

## 2017-07-25 ENCOUNTER — Telehealth: Payer: Self-pay

## 2017-07-25 NOTE — Telephone Encounter (Signed)
Pt called nurse line, requesting rx for back brace. Will forward to MD for review Her call back [phone] Wallace Cullens, RN

## 2017-07-26 ENCOUNTER — Ambulatory Visit
Admission: RE | Admit: 2017-07-26 | Discharge: 2017-07-26 | Disposition: A | Discharge status: Home / Self Care | Payer: Medicare Other | Source: Ambulatory Visit | Attending: Cardiovascular Disease | Admitting: Cardiovascular Disease

## 2017-07-26 ENCOUNTER — Other Ambulatory Visit: Payer: Self-pay | Admitting: Cardiovascular Disease

## 2017-07-26 DIAGNOSIS — R072 Precordial pain: Secondary | ICD-10-CM | POA: Diagnosis not present

## 2017-07-26 DIAGNOSIS — R002 Palpitations: Secondary | ICD-10-CM | POA: Diagnosis not present

## 2017-07-26 DIAGNOSIS — I10 Essential (primary) hypertension: Secondary | ICD-10-CM | POA: Diagnosis not present

## 2017-07-26 DIAGNOSIS — Z79899 Other long term (current) drug therapy: Secondary | ICD-10-CM | POA: Diagnosis not present

## 2017-07-26 DIAGNOSIS — E559 Vitamin D deficiency, unspecified: Secondary | ICD-10-CM | POA: Diagnosis not present

## 2017-07-26 DIAGNOSIS — E119 Type 2 diabetes mellitus without complications: Secondary | ICD-10-CM | POA: Diagnosis not present

## 2017-07-26 DIAGNOSIS — E785 Hyperlipidemia, unspecified: Secondary | ICD-10-CM | POA: Diagnosis not present

## 2017-07-26 DIAGNOSIS — D649 Anemia, unspecified: Secondary | ICD-10-CM | POA: Diagnosis not present

## 2017-07-26 DIAGNOSIS — E876 Hypokalemia: Secondary | ICD-10-CM | POA: Diagnosis not present

## 2017-07-26 DIAGNOSIS — R0602 Shortness of breath: Secondary | ICD-10-CM

## 2017-07-26 DIAGNOSIS — Z87891 Personal history of nicotine dependence: Secondary | ICD-10-CM | POA: Diagnosis not present

## 2017-07-27 NOTE — Telephone Encounter (Signed)
White team please call patient and let her know I do not prescribe back braces. If she is having back pain would like for her to schedule an appointment to see me so we can try and help her. Thank you.   Smitty Cords, MD [place], PGY-3

## 2017-07-28 DIAGNOSIS — M25511 Pain in right shoulder: Secondary | ICD-10-CM | POA: Diagnosis not present

## 2017-07-28 DIAGNOSIS — M75121 Complete rotator cuff tear or rupture of right shoulder, not specified as traumatic: Secondary | ICD-10-CM | POA: Diagnosis not present

## 2017-07-28 NOTE — Telephone Encounter (Signed)
Per pt she has had this problem for years, has had surgery, doesn't need to come in. "I will call the pain Dr. and see if he will prescribe the brace because that is what I need." Ottis Stain, CMA

## 2017-08-02 ENCOUNTER — Telehealth: Payer: Self-pay | Admitting: Family Medicine

## 2017-08-02 DIAGNOSIS — Z1211 Encounter for screening for malignant neoplasm of colon: Secondary | ICD-10-CM

## 2017-08-02 DIAGNOSIS — M75121 Complete rotator cuff tear or rupture of right shoulder, not specified as traumatic: Secondary | ICD-10-CM | POA: Diagnosis not present

## 2017-08-02 DIAGNOSIS — M25511 Pain in right shoulder: Secondary | ICD-10-CM | POA: Diagnosis not present

## 2017-08-02 NOTE — Telephone Encounter (Signed)
Pt needs a referral for a Gastrologist.

## 2017-08-03 NOTE — Telephone Encounter (Signed)
Pt called back today and said she would like to be contacted as soon as possible. She says her Dr had asked her to have a colonoscopy at the time she did not want to have it done. She now wants to have it done and wants to know where she should go.She said she was unhappy "with how long it takes to get answers"  I told her the note had been sent to her doctor yesterday and that she should hear something soon.

## 2017-08-03 NOTE — Telephone Encounter (Signed)
Spoke to pt. She would like a referral to [facility] because she did the home test and it showed positive for blood. Pt said results have been sent to Dr. Juanito Doom. Pt said [facility] needs a referral and results sent to them. Ottis Stain, CMA

## 2017-08-03 NOTE — Telephone Encounter (Signed)
Referral placed to GI for colonoscopy. I have not seen any results in my inbox for a positive stool blood test. Will continue to be on the look out.   White team please call patient an inform I have placed the referral. Thank you.   Smitty Cords, MD [place], PGY-3

## 2017-08-08 ENCOUNTER — Other Ambulatory Visit: Payer: Self-pay | Admitting: Family Medicine

## 2017-08-08 ENCOUNTER — Other Ambulatory Visit: Payer: Self-pay

## 2017-08-08 MED ORDER — ESOMEPRAZOLE MAGNESIUM 40 MG PO CPDR: 40.0000 mg | DELAYED_RELEASE_CAPSULE | Freq: Every day | ORAL | 3 refills | Status: DC

## 2017-08-11 DIAGNOSIS — M75121 Complete rotator cuff tear or rupture of right shoulder, not specified as traumatic: Secondary | ICD-10-CM | POA: Diagnosis not present

## 2017-08-11 DIAGNOSIS — M25511 Pain in right shoulder: Secondary | ICD-10-CM | POA: Diagnosis not present

## 2017-08-15 DIAGNOSIS — M1990 Unspecified osteoarthritis, unspecified site: Secondary | ICD-10-CM | POA: Diagnosis not present

## 2017-08-15 DIAGNOSIS — R252 Cramp and spasm: Secondary | ICD-10-CM | POA: Diagnosis not present

## 2017-08-16 DIAGNOSIS — M25511 Pain in right shoulder: Secondary | ICD-10-CM | POA: Diagnosis not present

## 2017-08-16 DIAGNOSIS — M75121 Complete rotator cuff tear or rupture of right shoulder, not specified as traumatic: Secondary | ICD-10-CM | POA: Diagnosis not present

## 2017-08-26 ENCOUNTER — Other Ambulatory Visit: Payer: Self-pay | Admitting: Family Medicine

## 2017-08-26 DIAGNOSIS — M549 Dorsalgia, unspecified: Secondary | ICD-10-CM

## 2017-08-29 ENCOUNTER — Ambulatory Visit: Payer: Medicare Other | Admitting: Family Medicine

## 2017-09-01 ENCOUNTER — Ambulatory Visit: Payer: Medicare Other | Admitting: Family Medicine

## 2017-09-01 DIAGNOSIS — M75121 Complete rotator cuff tear or rupture of right shoulder, not specified as traumatic: Secondary | ICD-10-CM | POA: Diagnosis not present

## 2017-09-01 DIAGNOSIS — M169 Osteoarthritis of hip, unspecified: Secondary | ICD-10-CM | POA: Diagnosis not present

## 2017-09-01 DIAGNOSIS — M25511 Pain in right shoulder: Secondary | ICD-10-CM | POA: Diagnosis not present

## 2017-09-01 DIAGNOSIS — M1990 Unspecified osteoarthritis, unspecified site: Secondary | ICD-10-CM | POA: Diagnosis not present

## 2017-09-01 NOTE — Progress Notes (Deleted)
Subjective:    Patient ID: Norma [name] , female   DOB: 1960/06/03 , 57 y.o..   MRN: [id number]  HPI  Norma [name] is a 57 yo with PMH of HTN, GERD, eczema, obesity, chronic pain, prediabetes, anemia, vit D deficiency here for No chief complaint on file.   1. ***  Review of Systems: Per HPI. All other systems reviewed and are negative.  Health Maintenance Due  Topic Date Due  . PNEUMOCOCCAL POLYSACCHARIDE VACCINE (1) 11/29/1960  . FOOT EXAM  11/29/1968  . OPHTHALMOLOGY EXAM  11/29/1968  . URINE MICROALBUMIN  11/29/1968  . COLONOSCOPY  11/29/2008    Past Medical History: Patient Active Problem List   Diagnosis Date Noted  . Plantar fasciitis of right foot 07/06/2017  . Iron deficiency anemia 03/29/2017  . Vitamin D deficiency 03/01/2017  . Anemia 03/01/2017  . Cough 01/11/2017  . Environmental allergies 11/03/2016  . Proteinuria 09/17/2014  . Flank pain, chronic 08/27/2014  . Other cyst of bone, right ankle and foot 06/21/2014  . Lumbar herniated disc 06/06/2014  . Depression   . Prediabetes 01/14/2014  . Spinal stenosis, lumbar region, with neurogenic claudication 11/21/2013  . Muscle cramps 06/21/2013  . Right shoulder pain 04/18/2013  . Somatic dysfunction - Osteopathic Findings 07/02/2012  . Rotator cuff tear, left 05/05/2012  . Chest pain 04/26/2012  . Decreased hearing 12/29/2011  . Eczema, dyshidrotic 06/02/2010  . FIBROIDS, UTERUS 10/24/2009  . GERD 06/09/2009  . Chronic pain syndrome 10/04/2008  . HIP PAIN, LEFT, CHRONIC 08/16/2007  . Hepatitis C carrier ([place]) 03/21/2007  . Obesity 06/16/2006  . HYPERTENSION, BENIGN SYSTEMIC 06/16/2006    Medications: reviewed and updated Current Outpatient Medications  Medication Sig Dispense Refill  . ARIPiprazole (ABILIFY) 2 MG tablet Take 2 mg by mouth daily.    Marland Kitchen buPROPion (WELLBUTRIN XL) 300 MG 24 hr tablet Take 300 mg by mouth daily.    . cetirizine (ZYRTEC) 5 MG tablet Take 1 tablet (5 mg total) by  mouth daily. (Patient not taking: Reported on 04/13/2017) 30 tablet 3  . cyclobenzaprine (FLEXERIL) 10 MG tablet Take 1 tablet (10 mg total) by mouth daily as needed for muscle spasms. 30 tablet 2  . diclofenac sodium (VOLTAREN) 1 % GEL Apply 2 g topically 4 (four) times daily. 100 g 3  . diclofenac sodium (VOLTAREN) 1 % GEL Apply 2 g topically 2 (two) times daily as needed (heel pain). 100 g 1  . diltiazem (CARDIZEM SR) 60 MG 12 hr capsule Take 1 capsule (60 mg total) by mouth 2 (two) times daily. (Patient not taking: Reported on 04/13/2017) 180 capsule 1  . esomeprazole (NEXIUM) 40 MG capsule Take 1 capsule (40 mg total) by mouth at bedtime. 90 capsule 3  . ferrous sulfate 325 (65 FE) MG tablet Take 1 tablet (325 mg total) by mouth 2 (two) times daily with a meal. 60 tablet 3  . fish oil-omega-3 fatty acids 1000 MG capsule Take 2 g by mouth daily.    . furosemide (LASIX) 40 MG tablet Take 40 mg by mouth once a week.    Marland Kitchen guaiFENesin-codeine (ROBITUSSIN AC) 100-10 MG/5ML syrup Take 10 mLs by mouth 3 (three) times daily as needed for cough. May cause drowsiness or sedation! 120 mL 0  . hydrochlorothiazide (HYDRODIURIL) 25 MG tablet Take 1 tablet (25 mg total) by mouth daily. 90 tablet 3  . HYDROcodone-acetaminophen (NORCO/VICODIN) 5-325 MG per tablet Take 1 tablet by mouth every 6 (six) hours as  needed for moderate pain.   0  . hydrOXYzine (ATARAX/VISTARIL) 10 MG tablet Take 10 mg by mouth 3 (three) times daily as needed for itching. Reported on 08/26/2015    . isosorbide mononitrate (IMDUR) 30 MG 24 hr tablet Take 30 mg by mouth daily.    . meloxicam (MOBIC) 15 MG tablet take 1 tablet by mouth once daily for 7 days then if needed for pain 30 tablet 0  . metoprolol tartrate (LOPRESSOR) 25 MG tablet Take 1 tablet (25 mg total) by mouth 2 (two) times daily. 180 tablet 3  . multivitamin-iron-minerals-folic acid (CENTRUM) chewable tablet Chew 1 tablet daily by mouth. 90 tablet 3  . omeprazole (PRILOSEC)  20 MG capsule TAKE 1 CAPSULE BY MOUTH ONCE DAILY 30 capsule 0  . pregabalin (LYRICA) 100 MG capsule Take 1 capsule (100 mg total) by mouth 2 (two) times daily. 60 capsule 5  . traZODone (DESYREL) 100 MG tablet TAKE 2 TABLETS BY MOUTH EVERY NIGHT AT BEDTIME 180 tablet 0  . trolamine salicylate (ASPERCREME/ALOE) 10 % cream Apply 1 application topically as needed for muscle pain. 85 g 0  . Vitamin D, Cholecalciferol, 1000 units CAPS Take 1 tablet by mouth daily. 90 capsule 1  . Vitamin D, Ergocalciferol, (DRISDOL) 50000 units CAPS capsule Take 1 capsule (50,000 Units total) by mouth every 7 (seven) days. 8 capsule 0   No current facility-administered medications for this visit.     Social Hx:  reports that she has never smoked. She has never used smokeless tobacco.   Objective:   There were no vitals taken for this visit. Physical Exam  Gen: NAD, alert, cooperative with exam, well-appearing HEENT: NCAT, PERRL, clear conjunctiva, oropharynx clear, supple neck Cardiac: Regular rate and rhythm, normal S1/S2, no murmur, no edema, capillary refill brisk  Respiratory: Clear to auscultation bilaterally, no wheezes, non-labored breathing Gastrointestinal: soft, non tender, non distended, bowel sounds present Skin: no rashes, normal turgor  Neurological: no gross deficits.  Psych: good insight, normal mood and affect  Assessment & Plan:  No problem-specific Assessment & Plan notes found for this encounter.  No orders of the defined types were placed in this encounter.  No orders of the defined types were placed in this encounter.   Smitty Cords, MD [employer], PGY-3

## 2017-09-12 DIAGNOSIS — M75100 Unspecified rotator cuff tear or rupture of unspecified shoulder, not specified as traumatic: Secondary | ICD-10-CM | POA: Diagnosis not present

## 2017-09-13 ENCOUNTER — Ambulatory Visit: Payer: Medicare Other | Admitting: Internal Medicine

## 2017-09-13 DIAGNOSIS — R195 Other fecal abnormalities: Secondary | ICD-10-CM | POA: Diagnosis not present

## 2017-09-22 ENCOUNTER — Ambulatory Visit: Payer: Medicare Other | Admitting: Family Medicine

## 2017-09-22 ENCOUNTER — Ambulatory Visit (INDEPENDENT_AMBULATORY_CARE_PROVIDER_SITE_OTHER): Payer: Medicare Other | Admitting: Student

## 2017-09-22 VITALS — BP 128/82 | HR 76 | Temp 97.8°F | Ht 68.0 in | Wt 261.0 lb

## 2017-09-22 DIAGNOSIS — R0789 Other chest pain: Secondary | ICD-10-CM

## 2017-09-22 NOTE — Patient Instructions (Signed)
It was great seeing you today! We have addressed the following issues today  Shoulder pain: this is likely due to muscle strain.  I recommend taking your pain medications.  You can also try heating or icing.  Please come back and see [place] if no improvement in your pain or if you have other symptoms concerning to you.   If we did any lab work today, and the results require attention, either me or my nurse will get in touch with you. If everything is normal, you will get a letter in mail and a message via . If you don't hear from [place] in two weeks, please give [place] a call. Otherwise, we look forward to seeing you again at your next visit. If you have any questions or concerns before then, please call the clinic at [phone].  Please bring all your medications to every doctors visit  Sign up for My Chart to have easy access to your labs results, and communication with your Primary care physician.    Please check-out at the front desk before leaving the clinic.    Take Care,   Dr. Cyndia Skeeters

## 2017-09-22 NOTE — Progress Notes (Signed)
Subjective:    Norma [name] is a 57 y.o. old female here for right chest pain  HPI Right chest pain: for 1 day.  Spontaneous onset without trauma injury or fall.  Denies unusual activity.  Pain is sharp over her right shoulder blade and over her right chest.  Pain is constant.  Pain is worse with movement.  Denies skin rash, cough, dyspnea, fever, chills, nausea, vomiting or abdominal pain.  Denies swelling or skin color change.  Has history of rotator cuff surgery right shoulder.  She is followed by physical therapy.  Last PT visit about a week ago.  No history of blood clots.  No recent long car ride of flight.  PMH/Problem List: has FIBROIDS, UTERUS; Obesity; Chronic pain syndrome; HYPERTENSION, BENIGN SYSTEMIC; GERD; HIP PAIN, LEFT, CHRONIC; Hepatitis C carrier ([place]); Eczema, dyshidrotic; Decreased hearing; Right-sided chest wall pain; Rotator cuff tear, left; Somatic dysfunction - Osteopathic Findings; Right shoulder pain; Muscle cramps; Spinal stenosis, lumbar region, with neurogenic claudication; Prediabetes; Depression; Lumbar herniated disc; Other cyst of bone, right ankle and foot; Flank pain, chronic; Proteinuria; Environmental allergies; Cough; Vitamin D deficiency; Anemia; Iron deficiency anemia; and Plantar fasciitis of right foot on their problem list.   has a past medical history of Acid reflux, Anxiety, Arthritis, Back pain, Depression, Diabetes mellitus without complication ([place]), Headache, Hypertension, Irregular heart beat, Left shoulder pain, Shortness of breath dyspnea, Tachyarrhythmia, and Trigger point of thoracic region (03/22/2012).  FH:  Family History  Problem Relation Age of Onset  . Diabetes Brother   . Diabetes Maternal Aunt   . Diabetes Paternal Aunt   . Diabetes Maternal Grandmother   . Diabetes Maternal Grandfather   . Diabetes Paternal Grandmother   . Diabetes Paternal Grandfather   . Congestive Heart Failure Mother   . Hypertension Son     [facility] Social History     Tobacco Use  . Smoking status: Never Smoker  . Smokeless tobacco: Never Used  Substance Use Topics  . Alcohol use: Yes    Comment: glass of wine occasionally  . Drug use: No    Comment: States no longer uses marijuana    Review of Systems Review of systems negative except for pertinent positives and negatives in history of present illness above.     Objective:     Vitals:   09/22/17 1352  BP: 128/82  Pulse: 76  Temp: 97.8 F (36.6 C)  TempSrc: Oral  SpO2: 96%  Weight: 261 lb (118.4 kg)  Height: 5\' 8"  (1.727 m)   Body mass index is 39.68 kg/m.  Physical Exam  GEN: appears well & comfortable. No apparent distress. Head: normocephalic and atraumatic  Eyes: conjunctiva without injection. Sclera anicteric. HEM: negative for cervical or periauricular lymphadenopathies CVS: RRR, nl s1 & s2, no murmurs, no edema RESP: no IWOB, good air movement bilaterally, CTAB MSK: No apparent swelling.  Both shoulders symmetric.  No overlying skin change.  Tenderness to palpation medial to the lower angle of the right scapula.  Tenderness to palpation over the right chest.  Active flexion and abduction about 160 and right shoulder.  Passive flexion and abduction about 180.  Motor strength is symmetric in all muscle groups bilaterally. NEURO: alert and oiented appropriately, no gross deficits   PSYCH: euthymic mood with congruent affect    Assessment and Plan:  1. Right-sided chest wall pain: pain musculoskeletal in nature likely due to muscle strain.  She has tenderness to palpation over the medial aspect of the angle of her  right scapula and right chest anteriorly.  She has no cardiopulmonary symptoms to suggest cardiopulmonary issue.  No GI symptoms to think of gallbladder etiology.  Not a dermatomal to think of prodrome of herpes zoster. She is already on Percocet, muscle relaxers and meloxicam.  I recommended adding heating or ice as needed.  Discussed return precautions in  detail.  Return if symptoms worsen or fail to improve.  Mercy Riding, MD 09/22/17 Pager: [phone]

## 2017-09-26 ENCOUNTER — Other Ambulatory Visit: Payer: Self-pay

## 2017-09-26 DIAGNOSIS — E559 Vitamin D deficiency, unspecified: Secondary | ICD-10-CM

## 2017-10-13 DIAGNOSIS — M1990 Unspecified osteoarthritis, unspecified site: Secondary | ICD-10-CM | POA: Diagnosis not present

## 2017-10-13 DIAGNOSIS — E785 Hyperlipidemia, unspecified: Secondary | ICD-10-CM | POA: Diagnosis not present

## 2017-10-13 DIAGNOSIS — G894 Chronic pain syndrome: Secondary | ICD-10-CM | POA: Diagnosis not present

## 2017-10-13 DIAGNOSIS — R5382 Chronic fatigue, unspecified: Secondary | ICD-10-CM | POA: Diagnosis not present

## 2017-10-13 DIAGNOSIS — M75101 Unspecified rotator cuff tear or rupture of right shoulder, not specified as traumatic: Secondary | ICD-10-CM | POA: Diagnosis not present

## 2017-10-17 ENCOUNTER — Encounter: Payer: Self-pay | Admitting: Family Medicine

## 2017-10-17 ENCOUNTER — Telehealth: Payer: Self-pay

## 2017-10-17 ENCOUNTER — Ambulatory Visit (INDEPENDENT_AMBULATORY_CARE_PROVIDER_SITE_OTHER): Payer: Medicare Other | Admitting: Family Medicine

## 2017-10-17 ENCOUNTER — Other Ambulatory Visit: Payer: Self-pay

## 2017-10-17 VITALS — BP 118/62 | HR 93 | Temp 97.9°F | Ht 68.0 in | Wt 255.0 lb

## 2017-10-17 DIAGNOSIS — Z79899 Other long term (current) drug therapy: Secondary | ICD-10-CM | POA: Diagnosis not present

## 2017-10-17 DIAGNOSIS — M48062 Spinal stenosis, lumbar region with neurogenic claudication: Secondary | ICD-10-CM

## 2017-10-17 DIAGNOSIS — D509 Iron deficiency anemia, unspecified: Secondary | ICD-10-CM | POA: Diagnosis not present

## 2017-10-17 DIAGNOSIS — R252 Cramp and spasm: Secondary | ICD-10-CM | POA: Diagnosis not present

## 2017-10-17 DIAGNOSIS — E559 Vitamin D deficiency, unspecified: Secondary | ICD-10-CM

## 2017-10-17 DIAGNOSIS — D649 Anemia, unspecified: Secondary | ICD-10-CM | POA: Diagnosis not present

## 2017-10-17 DIAGNOSIS — R5383 Other fatigue: Secondary | ICD-10-CM | POA: Diagnosis not present

## 2017-10-17 NOTE — Telephone Encounter (Signed)
Pt called nurse line stating, "im in so much pain, im cramping all over." Pt stated she wanted a refill on her Vitamin D medication, however, this was refused by [name] on 6/10. Pt scheduled with Lindell Noe for this afternoon.

## 2017-10-17 NOTE — Progress Notes (Signed)
   CC: cramping all over   HPI  Cramping - in hands, feet, calves. Uncomfortable. Potassium and vitamin D helped in the past, she ran out 1 month ago. She never saw rheum because she didn't want to drive. Denies proximal cramping or weakness. No associated weakness with the cramping. States vitamin d really helped before. Reports she is cured of her hep C and saw a GI office in town many years ago for this.   Knot on L Hip - hurts when walking, hx of back surgery. Pain going down the back of both legs. Surgeon was [place] at [facility]. Has been present for months. Pain is increasing.   ROS: Denies CP, SOB, + cramping abdominal pain, denies dysuria, changes in BMs.   CC, SH/smoking status, and VS noted  Objective: BP 118/62   Pulse 93   Temp 97.9 F (36.6 C) (Oral)   Ht 5\' 8"  (1.727 m)   Wt 255 lb (115.7 kg)   SpO2 97%   BMI 38.77 kg/m  Gen: NAD, alert, cooperative, and pleasant. HEENT: NCAT, EOMI, PERRL CV: RRR, no murmur Resp: CTAB, no wheezes, non-labored Abd: SNTND, BS present, no guarding or organomegaly Ext: No edema, warm. + straight leg raise. Normal strength in bilateral hands and feet, normal sensation, nontender to palpations.  Neuro: Alert and oriented, Speech clear, No gross deficits  Assessment and plan:  Spinal stenosis, lumbar region, with neurogenic claudication Patient reports continued sciatica symptoms and wants to be re-imaged, she has had surgery since she saw [place] last for this. Asked her to call her neurosurgeon and get back in with him to discuss whether imaging is appropriate.   Iron deficiency anemia Will recheck CBC and anemia panel today. Supposed to be on daily iron. Unclear etiology as she is post menopausal. Needs GI workup.   Muscle cramps Persistent concern for polymyositis given hx of elevated CK. Encouraged patient to consider going to rheumatology to try to complete evaluation for this and possibly get therapy. Will recheck  vitamin d and BMP today as patient would like potassium supplement despite last K was normal. Did consider cryoglobulinemia from [facility], although patient states she was cured. If persistent, may consider testing to ensure this is accurate or getting records from GI?   Orders Placed This Encounter  Procedures  . CBC  . Basic metabolic panel  . VITAMIN D 25 Hydroxy (Vit-D Deficiency, Fractures)  . Anemia panel  . Ambulatory referral to Rheumatology    Referral Priority:   Routine    Referral Type:   Consultation    Referral Reason:   Specialty Services Required    Requested Specialty:   Rheumatology    Number of Visits Requested:   1    Meds ordered this encounter  Medications  . Vitamin D, Ergocalciferol, (DRISDOL) 50000 units CAPS capsule    Sig: Take 1 capsule (50,000 Units total) by mouth every 7 (seven) days.    Dispense:  8 capsule    Refill:  0     Ralene Ok, MD, PGY2 10/19/2017 11:37 AM

## 2017-10-17 NOTE — Patient Instructions (Signed)
It was a pleasure to see you today! Thank you for choosing [facility] for your primary care. Norma [name] was seen for cramping.   Our plans for today were:  I will call you tomorrow with lab results and we will send your potassium and vitamin D if appropriate.   Please consider going to the rheumatologist for the cramping.   Come back in 1 month to see your new doctor to see how the cramping is going.   Please call your neurosurgeon to consider imaging for your back.   Best,  Dr. Lindell Noe

## 2017-10-18 LAB — ANEMIA PANEL
FERRITIN: 12 ng/mL — AB (ref 15–150)
FOLATE, HEMOLYSATE: 402.8 ng/mL
Folate, RBC: 1255 ng/mL (ref >498)
Hematocrit: 32.1 % — ABNORMAL LOW (ref 34.0–46.6)
IRON: 32 ug/dL (ref 27–159)
Iron Saturation: 7 % — CL (ref 15–55)
RETIC CT PCT: 1.6 % (ref 0.6–2.6)
TIBC: 482 ug/dL — AB (ref 250–450)
UIBC: 450 ug/dL — AB (ref 131–425)
Vitamin B-12: 795 pg/mL (ref 232–1245)

## 2017-10-18 LAB — BASIC METABOLIC PANEL
BUN / CREAT RATIO: 14 (ref 9–23)
BUN: 13 mg/dL (ref 6–24)
CO2: 27 mmol/L (ref 20–29)
Calcium: 9.5 mg/dL (ref 8.7–10.2)
Chloride: 96 mmol/L (ref 96–106)
Creatinine, Ser: 0.96 mg/dL (ref 0.57–1.00)
GFR calc Af Amer: 75 mL/min/{1.73_m2} (ref >59)
GFR, EST NON AFRICAN AMERICAN: 65 mL/min/{1.73_m2} (ref >59)
GLUCOSE: 92 mg/dL (ref 65–99)
POTASSIUM: 3.5 mmol/L (ref 3.5–5.2)
SODIUM: 138 mmol/L (ref 134–144)

## 2017-10-18 LAB — CBC
Hemoglobin: 10 g/dL — ABNORMAL LOW (ref 11.1–15.9)
MCH: 22.7 pg — ABNORMAL LOW (ref 26.6–33.0)
MCHC: 31.2 g/dL — AB (ref 31.5–35.7)
MCV: 73 fL — ABNORMAL LOW (ref 79–97)
Platelets: 265 10*3/uL (ref 150–450)
RBC: 4.4 x10E6/uL (ref 3.77–5.28)
RDW: 17.6 % — AB (ref 12.3–15.4)
WBC: 6.9 10*3/uL (ref 3.4–10.8)

## 2017-10-18 LAB — VITAMIN D 25 HYDROXY (VIT D DEFICIENCY, FRACTURES): Vit D, 25-Hydroxy: 25.2 ng/mL — ABNORMAL LOW (ref 30.0–100.0)

## 2017-10-19 ENCOUNTER — Telehealth: Payer: Self-pay | Admitting: *Deleted

## 2017-10-19 MED ORDER — VITAMIN D (ERGOCALCIFEROL) 1.25 MG (50000 UNIT) PO CAPS
50000.0000 [IU] | ORAL_CAPSULE | ORAL | 0 refills | Status: DC
Start: 2017-10-19 — End: 2017-12-09

## 2017-10-19 NOTE — Assessment & Plan Note (Signed)
Patient reports continued sciatica symptoms and wants to be re-imaged, she has had surgery since she saw [place] last for this. Asked her to call her neurosurgeon and get back in with him to discuss whether imaging is appropriate.

## 2017-10-19 NOTE — Assessment & Plan Note (Addendum)
Persistent concern for polymyositis given hx of elevated CK. Encouraged patient to consider going to rheumatology to try to complete evaluation for this and possibly get therapy. Will recheck vitamin d and BMP today as patient would like potassium supplement despite last K was normal. Did consider cryoglobulinemia from [facility], although patient states she was cured. If persistent, may consider testing to ensure this is accurate or getting records from GI?

## 2017-10-19 NOTE — Telephone Encounter (Signed)
Pt returned call.    Relayed message from Dr. Lindell Noe in lab results note.  Pt has colonoscopy @ [facility] on 7/21  She also wants to let Dr. Lindell Noe know that the Vit D is not covered by her insurance.  She would like something that is covered called in. Olena Willy, Salome Spotted, CMA

## 2017-10-19 NOTE — Assessment & Plan Note (Signed)
Will recheck CBC and anemia panel today. Supposed to be on daily iron. Unclear etiology as she is post menopausal. Needs GI workup.

## 2017-10-21 NOTE — Telephone Encounter (Signed)
Contacted pt and she had been given the message below and also had referral coordinator re-open the referral that had been closed. Norma [name], April D, [place]

## 2017-10-21 NOTE — Telephone Encounter (Signed)
-----   Message from Sela Hilding, MD sent at 10/19/2017 11:32 AM EDT ----- Called patient to discuss labs, left generic VM to return our call. I am happy to refill her daily vitamin D, and I still think she needs to see the rheumatologist about her cramping. I will not refill potassium as her level was normal and giving extra can be dangerous to her heart. Its ok to eat more mustard to try to help with cramps. Additionally, I am concerned about her anemia. Her iron studies look like she continues to be anemic, and I don't see where she has gotten a colonoscopy. She really really needs to do this as the first thing we check when someone has iron deficiency and is no longer having periods is to see if there is something going on insider her bowel. Does she need a new referral to GI?

## 2017-10-24 MED ORDER — VITAMIN D 50 MCG (2000 UT) PO CAPS: 1.0000 | ORAL_CAPSULE | Freq: Every day | ORAL | 2 refills | Status: DC

## 2017-10-24 NOTE — Telephone Encounter (Signed)
We will try the 2000 IU dose, but I'm not sure that vitamin D will be covered at all as this is OTC.

## 2017-10-24 NOTE — Telephone Encounter (Signed)
LVM for pt to call the office. If she calls, please give her the info below. Ottis Stain, CMA

## 2017-10-24 NOTE — Telephone Encounter (Signed)
Pt called back and said that she picked up the Rx that was sent in on 10/19/17. Confirmed with Dr. Lindell Noe that she could take this and she said yes and pt requested that no medicine be sent in that her insurance will not cover.  I explained that there are some medications that insurances will not cover, but that I would make note of her request. Katharina Caper, Domingo Fuson D, CMA

## 2017-10-25 DIAGNOSIS — M169 Osteoarthritis of hip, unspecified: Secondary | ICD-10-CM | POA: Diagnosis not present

## 2017-10-26 ENCOUNTER — Other Ambulatory Visit: Payer: Self-pay | Admitting: Family Medicine

## 2017-10-26 DIAGNOSIS — G8929 Other chronic pain: Secondary | ICD-10-CM

## 2017-10-26 DIAGNOSIS — M546 Pain in thoracic spine: Principal | ICD-10-CM

## 2017-10-28 ENCOUNTER — Ambulatory Visit: Payer: Medicare Other | Admitting: Student in an Organized Health Care Education/Training Program

## 2017-11-03 DIAGNOSIS — M179 Osteoarthritis of knee, unspecified: Secondary | ICD-10-CM | POA: Diagnosis not present

## 2017-11-04 NOTE — Progress Notes (Deleted)
Office Visit Note  Patient: Norma [name]             Date of Birth: 04-06-1961           MRN: [id number]             PCP: Gregery Na, DO Referring: Sela Hilding, MD Visit Date: 11/18/2017 Occupation: @GUAROCC @  Subjective:  No chief complaint on file.   History of Present Illness: Norma [name] is a 57 y.o. female ***   Activities of Daily Living:  Patient reports morning stiffness for *** {minute/hour:19697}.   Patient {ACTIONS;DENIES/REPORTS:21021675::"Denies"} nocturnal pain.  Difficulty dressing/grooming: {ACTIONS;DENIES/REPORTS:21021675::"Denies"} Difficulty climbing stairs: {ACTIONS;DENIES/REPORTS:21021675::"Denies"} Difficulty getting out of chair: {ACTIONS;DENIES/REPORTS:21021675::"Denies"} Difficulty using hands for taps, buttons, cutlery, and/or writing: {ACTIONS;DENIES/REPORTS:21021675::"Denies"}  No Rheumatology ROS completed.   PMFS History:  Patient Active Problem List   Diagnosis Date Noted  . Plantar fasciitis of right foot 07/06/2017  . Iron deficiency anemia 03/29/2017  . Vitamin D deficiency 03/01/2017  . Anemia 03/01/2017  . Cough 01/11/2017  . Environmental allergies 11/03/2016  . Proteinuria 09/17/2014  . Flank pain, chronic 08/27/2014  . Other cyst of bone, right ankle and foot 06/21/2014  . Lumbar herniated disc 06/06/2014  . Depression   . Prediabetes 01/14/2014  . Spinal stenosis, lumbar region, with neurogenic claudication 11/21/2013  . Muscle cramps 06/21/2013  . Right shoulder pain 04/18/2013  . Somatic dysfunction - Osteopathic Findings 07/02/2012  . Rotator cuff tear, left 05/05/2012  . Right-sided chest wall pain 04/26/2012  . Decreased hearing 12/29/2011  . Eczema, dyshidrotic 06/02/2010  . FIBROIDS, UTERUS 10/24/2009  . GERD 06/09/2009  . Chronic pain syndrome 10/04/2008  . HIP PAIN, LEFT, CHRONIC 08/16/2007  . Hepatitis C carrier ([place]) 03/21/2007  . Obesity 06/16/2006  . HYPERTENSION, BENIGN SYSTEMIC  06/16/2006    Past Medical History:  Diagnosis Date  . Acid reflux   . Anxiety   . Arthritis   . Back pain   . Depression   . Diabetes mellitus without complication ([place])    on meds  . Headache    stress headaches, migraines at times  . Hypertension   . Irregular heart beat   . Left shoulder pain   . Shortness of breath dyspnea    with exertion  . Tachyarrhythmia   . Trigger point of thoracic region 03/22/2012    Family History  Problem Relation Age of Onset  . Diabetes Brother   . Diabetes Maternal Aunt   . Diabetes Paternal Aunt   . Diabetes Maternal Grandmother   . Diabetes Maternal Grandfather   . Diabetes Paternal Grandmother   . Diabetes Paternal Grandfather   . Congestive Heart Failure Mother   . Hypertension Son    Past Surgical History:  Procedure Laterality Date  . ANKLE SURGERY    . LUMBAR LAMINECTOMY/DECOMPRESSION MICRODISCECTOMY Left 06/06/2014   Procedure: LUMBAR LAMINECTOMY/DECOMPRESSION MICRODISCECTOMY 1 LEVEL;  Surgeon: Newman Pies, MD;  Location: [facility];  Service: Neurosurgery;  Laterality: Left;  Left L5S1 microdiskectomy  . ROTATOR CUFF REPAIR Left 02/20/14  . TUBAL LIGATION     Social History   Social History Narrative  . Not on file    Objective: Vital Signs: There were no vitals taken for this visit.   Physical Exam   Musculoskeletal Exam: ***  CDAI Exam: No CDAI exam completed.   Investigation: No additional findings.  Component     Latest Ref Rng & Units 10/17/2017  TIBC     250 -  450 ug/dL 482 (H)  UIBC     131 - 425 ug/dL 450 (H)  Iron     27 - 159 ug/dL 32  Iron Saturation     15 - 55 % 7 (LL)  Vitamin B12     232 - 1,245 pg/mL 795  Folate, Hemolysate     Not Estab. ng/mL 402.8  HCT     34.0 - 46.6 % 32.1 (L)  Folate, RBC     >498 ng/mL 1,255  Ferritin     15 - 150 ng/mL 12 (L)  Retic Ct Pct     0.6 - 2.6 % 1.6  Vitamin D, 25-Hydroxy     30.0 - 100.0 ng/mL 25.2 (L)   Component     Latest Ref Rng &  Units 03/29/2017 04/25/2017  Magnesium     1.6 - 2.3 mg/dL 2.1   CK Total     24 - 173 U/L  633 (HH)  Vitamin D, 25-Hydroxy     30.0 - 100.0 ng/mL  28.1 (L)   Imaging: No results found.  Recent Labs: Lab Results  Component Value Date   WBC 6.9 10/17/2017   HGB 10.0 (L) 10/17/2017   PLT 265 10/17/2017   NA 138 10/17/2017   K 3.5 10/17/2017   CL 96 10/17/2017   CO2 27 10/17/2017   GLUCOSE 92 10/17/2017   BUN 13 10/17/2017   CREATININE 0.96 10/17/2017   BILITOT 0.9 03/26/2017   ALKPHOS 92 03/26/2017   AST 56 (H) 03/26/2017   ALT 35 03/26/2017   PROT 9.3 (H) 03/26/2017   ALBUMIN 4.5 03/26/2017   CALCIUM 9.5 10/17/2017   GFRAA 75 10/17/2017    Speciality Comments: No specialty comments available.  Procedures:  No procedures performed Allergies: Soma [carisoprodol]; Nortriptyline; and Prednisone   Assessment / Plan:     Visit Diagnoses: Muscle cramps  Chronic pain syndrome  Tear of left rotator cuff, unspecified tear extent, unspecified whether traumatic  Spinal stenosis, lumbar region, with neurogenic claudication  Plantar fasciitis of right foot  Essential hypertension  History of gastroesophageal reflux (GERD)  History of diabetes mellitus, type II  Eczema, dyshidrotic  Vitamin D deficiency  Hepatitis C carrier ([place])  Anxiety and depression  History of anemia   Orders: No orders of the defined types were placed in this encounter.  No orders of the defined types were placed in this encounter.   Face-to-face time spent with patient was *** minutes. Greater than 50% of time was spent in counseling and coordination of care.  Follow-Up Instructions: No follow-ups on file.   Ofilia Neas, PA-C  Note - This record has been created using Dragon software.  Chart creation errors have been sought, but may not always  have been located. Such creation errors do not reflect on  the standard of medical care.

## 2017-11-07 DIAGNOSIS — G8929 Other chronic pain: Secondary | ICD-10-CM | POA: Diagnosis not present

## 2017-11-07 DIAGNOSIS — M5442 Lumbago with sciatica, left side: Secondary | ICD-10-CM | POA: Diagnosis not present

## 2017-11-07 DIAGNOSIS — R0602 Shortness of breath: Secondary | ICD-10-CM | POA: Diagnosis not present

## 2017-11-07 DIAGNOSIS — E7800 Pure hypercholesterolemia, unspecified: Secondary | ICD-10-CM | POA: Diagnosis not present

## 2017-11-07 DIAGNOSIS — R5383 Other fatigue: Secondary | ICD-10-CM | POA: Diagnosis not present

## 2017-11-07 DIAGNOSIS — E559 Vitamin D deficiency, unspecified: Secondary | ICD-10-CM | POA: Diagnosis not present

## 2017-11-07 DIAGNOSIS — Z79899 Other long term (current) drug therapy: Secondary | ICD-10-CM | POA: Diagnosis not present

## 2017-11-07 DIAGNOSIS — Z0000 Encounter for general adult medical examination without abnormal findings: Secondary | ICD-10-CM | POA: Diagnosis not present

## 2017-11-07 DIAGNOSIS — M129 Arthropathy, unspecified: Secondary | ICD-10-CM | POA: Diagnosis not present

## 2017-11-18 ENCOUNTER — Ambulatory Visit: Payer: Medicare Other | Admitting: Family Medicine

## 2017-11-18 ENCOUNTER — Ambulatory Visit: Payer: Self-pay | Admitting: Rheumatology

## 2017-11-21 DIAGNOSIS — R945 Abnormal results of liver function studies: Secondary | ICD-10-CM | POA: Diagnosis not present

## 2017-11-21 DIAGNOSIS — G8929 Other chronic pain: Secondary | ICD-10-CM | POA: Diagnosis not present

## 2017-11-21 DIAGNOSIS — E119 Type 2 diabetes mellitus without complications: Secondary | ICD-10-CM | POA: Diagnosis not present

## 2017-11-21 DIAGNOSIS — Z79899 Other long term (current) drug therapy: Secondary | ICD-10-CM | POA: Diagnosis not present

## 2017-11-21 DIAGNOSIS — D539 Nutritional anemia, unspecified: Secondary | ICD-10-CM | POA: Diagnosis not present

## 2017-11-21 DIAGNOSIS — M5442 Lumbago with sciatica, left side: Secondary | ICD-10-CM | POA: Diagnosis not present

## 2017-11-22 ENCOUNTER — Other Ambulatory Visit: Payer: Self-pay

## 2017-11-22 ENCOUNTER — Other Ambulatory Visit: Payer: Self-pay | Admitting: Family Medicine

## 2017-11-22 ENCOUNTER — Ambulatory Visit (INDEPENDENT_AMBULATORY_CARE_PROVIDER_SITE_OTHER): Payer: Medicare Other | Admitting: Family Medicine

## 2017-11-22 ENCOUNTER — Encounter: Payer: Self-pay | Admitting: Family Medicine

## 2017-11-22 VITALS — BP 124/64 | Temp 98.2°F | Wt 261.0 lb

## 2017-11-22 DIAGNOSIS — R21 Rash and other nonspecific skin eruption: Secondary | ICD-10-CM | POA: Diagnosis not present

## 2017-11-22 DIAGNOSIS — R7303 Prediabetes: Secondary | ICD-10-CM

## 2017-11-22 DIAGNOSIS — R252 Cramp and spasm: Secondary | ICD-10-CM | POA: Diagnosis not present

## 2017-11-22 DIAGNOSIS — M546 Pain in thoracic spine: Principal | ICD-10-CM

## 2017-11-22 DIAGNOSIS — G8929 Other chronic pain: Secondary | ICD-10-CM

## 2017-11-22 LAB — POCT GLYCOSYLATED HEMOGLOBIN (HGB A1C): HBA1C, POC (CONTROLLED DIABETIC RANGE): 5.9 % (ref 0.0–7.0)

## 2017-11-22 MED ORDER — TRIAMCINOLONE ACETONIDE 0.5 % EX OINT: 1.0000 "application " | TOPICAL_OINTMENT | Freq: Two times a day (BID) | CUTANEOUS | 0 refills | Status: DC

## 2017-11-22 NOTE — Assessment & Plan Note (Signed)
A1c today is 5.9 down from 6.4.  Patient was told yesterday at a different clinic that her A1c was 6.9 and was restarted on metformin 500mg  twice daily.  Given today's result, we discuss the patient therapeutic lifestyle changes and decrease in metformin dose. --She will continue on Metformin 500 mg daily. --Handout on diabetic diet given. --Follow-up with PCP.

## 2017-11-22 NOTE — Assessment & Plan Note (Addendum)
Patient presents today with maculopapular rash into the malar distribution.  Patient reports some pruritus associated with rash.  She has been using triamcinolone 0.5% once or twice a week without significant change.  No new cosmetic products which could cause rash.  No concern for fungal infection given morphology of the rash.  Patient does have a history of possible eczema for which she was treated with clobetasol in the past with minimal improvement.  Recommended patient use triamcinolone once or twice a day for a week to see if there is any improvement in rash.  Patient also had an elevated CK back in January which was associated with muscle cramps which was concerning for possible anemia/rheumatologic disease.  Patient never followed up with rheumatology.  Discussed need to make appointment to Rheum for further work-up.  Given rash location this could be skin manifestation of possible autoimmune disease such as lupus.  We will recheck CK today as well as CMP. Discussing discontinuing Lyrica which can cause elevation in CK. Patient has an appointment scheduled with PCP next week.  Basic rheumatologic lab work could be initiated such as ANA, RF etc or could just wait on rheumatology appointment.

## 2017-11-22 NOTE — Patient Instructions (Addendum)
It was great seeing you today! We have addressed the following issues today  1. Your A1c is 5.9, you are prediabetic. You need to work on your diet and exercise. You can take metformin 500 mg once a day. 2. I want you to call rheumatology today to make an appointmtent. 3. I will do some blood work today and you will follow up on the results next week with your regular. 4. I send the ointment to the pharmacy use it for a week and see if you notice any improvement. 5. Continue with iron and vitamin D supplementation.  If we did any lab work today, and the results require attention, either me or my nurse will get in touch with you. If everything is normal, you will get a letter in mail and a message via . If you don't hear from [place] in two weeks, please give [place] a call. Otherwise, we look forward to seeing you again at your next visit. If you have any questions or concerns before then, please call the clinic at [phone].  Please bring all your medications to every doctors visit  Sign up for My Chart to have easy access to your labs results, and communication with your Primary care physician. Please ask Front Desk for some assistance.   Please check-out at the front desk before leaving the clinic.    Take Care,   Dr. Andy Gauss   Diet Recommendations for Diabetes   Starchy (carb) foods: Bread, rice, pasta, potatoes, corn, cereal, grits, crackers, bagels, muffins, all baked goods.  (Fruits, milk, and yogurt also have carbohydrate, but most of these foods will not spike your blood sugar as the starchy foods will.)  A few fruits do cause high blood sugars; use small portions of bananas (limit to 1/2 at a time), grapes, watermelon, oranges, and most tropical fruits.    Protein foods: Meat, fish, poultry, eggs, dairy foods, and beans such as pinto and kidney beans (beans also provide carbohydrate).   1. Eat at least 3 meals and 1-2 snacks per day. Never go more than 4-5 hours while awake without  eating. Eat breakfast within the first hour of getting up.   2. Limit starchy foods to TWO per meal and ONE per snack. ONE portion of a starchy  food is equal to the following:   - ONE slice of bread (or its equivalent, such as half of a hamburger bun).   - 1/2 cup of a "scoopable" starchy food such as potatoes or rice.   - 15 grams of carbohydrate as shown on food label.  3. Include at every meal: a protein food, a carb food, and vegetables and/or fruit.   - Obtain twice the volume of veg's as protein or carbohydrate foods for both lunch and dinner.   - Fresh or frozen veg's are best.   - Keep frozen veg's on hand for a quick vegetable serving.

## 2017-11-22 NOTE — Progress Notes (Signed)
Subjective:    Patient ID: Norma [name], female    DOB: 29-Jun-1960, 57 y.o.   MRN: [id number]   CC: Rash on face   HPI: Patient is a 57 year old female who presents today complaining of rash on her face.  Patient reports that she had this rash in the past and was prescribed triamcinolone ointment.  She said she was told to use it as needed because it could make her skin lighter.  Patient reports that since his most recent break-up she has been using it once or twice a week.  Patient denies any change in make-up soap or other cosmetic.  Patient reports some itching associated with her rash.  She denies any fever or chills, but endorses fatigue and lower extremity cramping.  Patient was recently started on vitamin D and has been taking ferrous sulfate after iron panel showed anemia.  She also has a history of elevated CK and was supposed to follow-up with rheumatology which she never did.  Patient also reports that she was restarted on metformin yesterday because she was told her A1c was 6.9.  She denies any polyuria or polydipsia.  Smoking status reviewed   ROS: all other systems were reviewed and are negative other than in the HPI   Past Medical History:  Diagnosis Date  . Acid reflux   . Anxiety   . Arthritis   . Back pain   . Depression   . Diabetes mellitus without complication ([place])    on meds  . Headache    stress headaches, migraines at times  . Hypertension   . Irregular heart beat   . Left shoulder pain   . Shortness of breath dyspnea    with exertion  . Tachyarrhythmia   . Trigger point of thoracic region 03/22/2012    Past Surgical History:  Procedure Laterality Date  . ANKLE SURGERY    . LUMBAR LAMINECTOMY/DECOMPRESSION MICRODISCECTOMY Left 06/06/2014   Procedure: LUMBAR LAMINECTOMY/DECOMPRESSION MICRODISCECTOMY 1 LEVEL;  Surgeon: Newman Pies, MD;  Location: [facility];  Service: Neurosurgery;  Laterality: Left;  Left L5S1 microdiskectomy  . ROTATOR CUFF  REPAIR Left 02/20/14  . TUBAL LIGATION      Past medical history, surgical, family, and social history reviewed and updated in the EMR as appropriate.  Objective:  BP 124/64   Temp 98.2 F (36.8 C) (Oral)   Wt 261 lb (118.4 kg)   BMI 39.68 kg/m   Vitals and nursing note reviewed  General: NAD, pleasant, able to participate in exam Cardiac: RRR, normal heart sounds, no murmurs. 2+ radial and PT pulses bilaterally Respiratory: CTAB, normal effort, No wheezes, rales or rhonchi Abdomen: soft, nontender, nondistended, no hepatic or splenomegaly, +BS Extremities: no edema or cyanosis. WWP. Skin: Mildly erythematous maculopapular rash in the malar distribution. no scales or excoriation. Neuro: alert and oriented x4, no focal deficits Psych: Normal affect and mood   Assessment & Plan:    Rash and nonspecific skin eruption Patient presents today with maculopapular rash into the malar distribution.  Patient reports some pruritus associated with rash.  She has been using triamcinolone 0.5% once or twice a week without significant change.  No new cosmetic products which could cause rash.  No concern for fungal infection given morphology of the rash.  Patient does have a history of possible eczema for which she was treated with clobetasol in the past with minimal improvement.  Recommended patient use triamcinolone once or twice a day for a week to see  if there is any improvement in rash.  Patient also had an elevated CK back in January which was associated with muscle cramps which was concerning for possible anemia/rheumatologic disease.  Patient never followed up with rheumatology.  Discussed need to make appointment to Rheum for further work-up.  Given rash location this could be skin manifestation of possible autoimmune disease such as lupus.  We will recheck CK today as well as CMP. Discussing discontinuing Lyrica which can cause elevation in CK. Patient has an appointment scheduled with PCP next  week.  Basic rheumatologic lab work could be initiated such as ANA, RF etc or could just wait on rheumatology appointment.  Prediabetes A1c today is 5.9 down from 6.4.  Patient was told yesterday at a different clinic that her A1c was 6.9 and was restarted on metformin 500mg  twice daily.  Given today's result, we discuss the patient therapeutic lifestyle changes and decrease in metformin dose. --She will continue on Metformin 500 mg daily. --Handout on diabetic diet given. --Follow-up with PCP.    Marjie Skiff, MD [place] PGY-3

## 2017-11-23 LAB — COMPREHENSIVE METABOLIC PANEL
A/G RATIO: 1.4 (ref 1.2–2.2)
ALBUMIN: 4.2 g/dL (ref 3.5–5.5)
ALT: 36 IU/L — ABNORMAL HIGH (ref 0–32)
AST: 54 IU/L — ABNORMAL HIGH (ref 0–40)
Alkaline Phosphatase: 59 IU/L (ref 39–117)
BILIRUBIN TOTAL: 0.4 mg/dL (ref 0.0–1.2)
BUN / CREAT RATIO: 13 (ref 9–23)
BUN: 11 mg/dL (ref 6–24)
CHLORIDE: 101 mmol/L (ref 96–106)
CO2: 24 mmol/L (ref 20–29)
Calcium: 9.3 mg/dL (ref 8.7–10.2)
Creatinine, Ser: 0.88 mg/dL (ref 0.57–1.00)
GFR, EST AFRICAN AMERICAN: 84 mL/min/{1.73_m2} (ref >59)
GFR, EST NON AFRICAN AMERICAN: 73 mL/min/{1.73_m2} (ref >59)
GLOBULIN, TOTAL: 2.9 g/dL (ref 1.5–4.5)
Glucose: 120 mg/dL — ABNORMAL HIGH (ref 65–99)
POTASSIUM: 3.7 mmol/L (ref 3.5–5.2)
Sodium: 140 mmol/L (ref 134–144)
TOTAL PROTEIN: 7.1 g/dL (ref 6.0–8.5)

## 2017-11-23 LAB — CK TOTAL AND CKMB (NOT AT ARMC)
CK-MB Index: 3.6 ng/mL (ref 0.0–5.3)
Total CK: 548 U/L (ref 24–173)

## 2017-11-24 ENCOUNTER — Other Ambulatory Visit: Payer: Self-pay | Admitting: Family Medicine

## 2017-11-24 DIAGNOSIS — M549 Dorsalgia, unspecified: Secondary | ICD-10-CM

## 2017-11-28 DIAGNOSIS — R072 Precordial pain: Secondary | ICD-10-CM | POA: Diagnosis not present

## 2017-11-28 DIAGNOSIS — I10 Essential (primary) hypertension: Secondary | ICD-10-CM | POA: Diagnosis not present

## 2017-11-28 DIAGNOSIS — E119 Type 2 diabetes mellitus without complications: Secondary | ICD-10-CM | POA: Diagnosis not present

## 2017-11-28 NOTE — Progress Notes (Signed)
Reviewed.  Patient was advised by Dr. Andy Gauss to make appointment with Rheumatology.  Has an appointment with me on 8/14 and in September with Rheumatology.

## 2017-11-30 ENCOUNTER — Other Ambulatory Visit: Payer: Self-pay

## 2017-11-30 ENCOUNTER — Ambulatory Visit (INDEPENDENT_AMBULATORY_CARE_PROVIDER_SITE_OTHER): Payer: Medicare Other | Admitting: Family Medicine

## 2017-11-30 ENCOUNTER — Encounter: Payer: Self-pay | Admitting: Family Medicine

## 2017-11-30 VITALS — BP 130/72 | HR 95 | Temp 98.5°F | Ht 68.0 in | Wt 256.8 lb

## 2017-11-30 DIAGNOSIS — Z6839 Body mass index (BMI) 39.0-39.9, adult: Secondary | ICD-10-CM | POA: Diagnosis not present

## 2017-11-30 DIAGNOSIS — N6489 Other specified disorders of breast: Secondary | ICD-10-CM | POA: Diagnosis not present

## 2017-11-30 DIAGNOSIS — E669 Obesity, unspecified: Secondary | ICD-10-CM | POA: Diagnosis not present

## 2017-11-30 DIAGNOSIS — K219 Gastro-esophageal reflux disease without esophagitis: Secondary | ICD-10-CM

## 2017-11-30 MED ORDER — OMEPRAZOLE 20 MG PO CPDR: 20.0000 mg | DELAYED_RELEASE_CAPSULE | Freq: Every day | ORAL | 0 refills | Status: DC

## 2017-11-30 NOTE — Assessment & Plan Note (Signed)
Exam revealed no suspicious lesions, no skin changes, no discharge, no nodules were palpated.  No edema otherwise noted.  Doubt mastitis as is not painful, no redness.  Doubt edema as is no other edema is present.  Likely benign breast asymmetry. - Patient to obtain a mammogram to rule out potential of malignancy - Continue to monitor

## 2017-11-30 NOTE — Assessment & Plan Note (Signed)
Patient notes that she has been taking Nexium, but says that she thinks she was told her insurance will no longer cover it. -sent Rx for Omeprazole 20mg  QD - Patient advised to try to fill Nexium prescription, and if this is unsuccessful, fill the Omeprazole prescription, but she should not take both of them at the same time

## 2017-11-30 NOTE — Progress Notes (Signed)
Subjective: Chief Complaint  Patient presents with  . cool sculpting     HPI: Norma [name] is a 57 y.o. presenting to clinic today to discuss the following:  1 Left Breast Swelling Patient states that about a week and a half ago she noticed that her left breast has gotten larger.  She notes that her left breast has always been somewhat larger, but states that it is even more so now.  She denies any pain in the area, denies tenderness to palpation, denies nipple discharge, denies palpating underlying lumps or nodules, denies overlying skin changes, denies skin redness.  She also denies edema elsewhere.    2 Coolsculpting Patient states that she has been thinking about getting coolsculpting as she has seen it on TV and requests more information on this.  Patient states that she has had difficulty in the past with exercising due to back pain and knee pain, which she also attributes to her weight.  She notes that she would like to decrease her weight somewhat with coolsculpting to hopefully allow her to exercise more due to decreased pain in her back and knees.  She notes that she does not eat very much and that food is not a problem for her.    Health Maintenance: Pap smear, mammogram, colonscopy all overdue     ROS noted in HPI.   Past Medical, Surgical, Social, and Family History Reviewed & Updated per EMR.   Pertinent Historical Findings include:   Social History   Tobacco Use  Smoking Status Never Smoker  Smokeless Tobacco Never Used      Objective: BP 130/72   Pulse 95   Temp 98.5 F (36.9 C) (Oral)   Ht 5\' 8"  (1.727 m)   Wt 256 lb 12.8 oz (116.5 kg)   SpO2 98%   BMI 39.05 kg/m  Vitals and nursing notes reviewed  Physical Exam  Constitutional: She appears well-developed and well-nourished. No distress.  HENT:  Head: Normocephalic and atraumatic.  Cardiovascular: Normal rate and regular rhythm. Exam reveals no gallop and no friction rub.  No murmur  heard. Pulmonary/Chest: Effort normal and breath sounds normal. She has no wheezes. She has no rales. Right breast exhibits no inverted nipple, no mass, no nipple discharge, no skin change and no tenderness. Left breast exhibits no inverted nipple, no mass, no nipple discharge, no skin change and no tenderness. No breast swelling.    Musculoskeletal: She exhibits no edema.  Skin: Skin is warm and dry. She is not diaphoretic. No erythema.     No results found for this or any previous visit (from the past 72 hour(s)).  Assessment/Plan:  Obesity BMI 39.1.  - Patient encouraged to find a trained physician to discuss risks and benefits with her regarding Cool-sculpting. -Patient educated that not all physicians are trained in Coolsculpting and that no physicians in this practice perform this procedure. - Presented patient with evidence based results of the following study: Octavia Heir, Mai SV, [place], Sadick NS. Cryolipolysis for noninvasive body contouring: clinical efficacy and patient satisfaction. Clin Cosmet Investig Dermatol. 2014;7:201-205. Published 2014 Jun 26. doi:10.2147/CCID.S44371 - Will continue to assess status of weight at follow up visits - Can consider nutrition evaluation in the future  Breast asymmetry Exam revealed no suspicious lesions, no skin changes, no discharge, no nodules were palpated.  No edema otherwise noted.  Doubt mastitis as is not painful, no redness.  Doubt edema as is no other edema is present.  Likely benign breast asymmetry. - Patient to obtain a mammogram to rule out potential of malignancy - Continue to monitor  GERD Patient notes that she has been taking Nexium, but says that she thinks she was told her insurance will no longer cover it. -sent Rx for Omeprazole 20mg  QD - Patient advised to try to fill Nexium prescription, and if this is unsuccessful, fill the Omeprazole prescription, but she should not take both of them at the same  time     PATIENT EDUCATION PROVIDED: See AVS    Diagnosis and plan along with any newly prescribed medication(s) were discussed in detail with this patient today. The patient verbalized understanding and agreed with the plan. Patient advised if symptoms worsen return to clinic or ER.   Health Maintainance: Patient is scheduled for a colonoscopy in September, Patient will make an appointment for a Pap smear upon leaving today.  Patient will call for a mammogram.   No orders of the defined types were placed in this encounter.   Meds ordered this encounter  Medications  . omeprazole (PRILOSEC) 20 MG capsule    Sig: Take 1 capsule (20 mg total) by mouth daily.    Dispense:  30 capsule    Refill:  [place], DO 11/30/2017, 4:10 PM PGY-1 [place]

## 2017-11-30 NOTE — Assessment & Plan Note (Addendum)
BMI 39.1.  - Patient encouraged to find a trained physician to discuss risks and benefits with her regarding Cool-sculpting. -Patient educated that not all physicians are trained in Coolsculpting and that no physicians in this practice perform this procedure. - Presented patient with evidence based results of the following study: Octavia Heir, Mai SV, [place], Sadick NS. Cryolipolysis for noninvasive body contouring: clinical efficacy and patient satisfaction. Clin Cosmet Investig Dermatol. 2014;7:201-205. Published 2014 Jun 26. doi:10.2147/CCID.I37955 - Will continue to assess status of weight at follow up visits - Can consider nutrition evaluation in the future

## 2017-11-30 NOTE — Patient Instructions (Signed)
Thank you so much for coming in!  It was great to meet you!  Please call to schedule your mammogram soon.  Please also make an appointment for your Pap Smear.  I also suggest that you get a colonoscopy, as it appears you have never had one and we recommend it in someone your age.  If you are interested in pursuing CoolSculpting, I recommend that you find a physician who is certified in this to discuss the risks and benefits.  I have also sent in your Nexium, but please let me know if it isn't covered by your insurance!  Best, Dr. Sandi Carne

## 2017-12-02 DIAGNOSIS — M79605 Pain in left leg: Secondary | ICD-10-CM | POA: Diagnosis not present

## 2017-12-02 DIAGNOSIS — M79604 Pain in right leg: Secondary | ICD-10-CM | POA: Diagnosis not present

## 2017-12-02 DIAGNOSIS — R0989 Other specified symptoms and signs involving the circulatory and respiratory systems: Secondary | ICD-10-CM | POA: Diagnosis not present

## 2017-12-02 DIAGNOSIS — R945 Abnormal results of liver function studies: Secondary | ICD-10-CM | POA: Diagnosis not present

## 2017-12-05 DIAGNOSIS — R768 Other specified abnormal immunological findings in serum: Secondary | ICD-10-CM | POA: Diagnosis not present

## 2017-12-05 DIAGNOSIS — D689 Coagulation defect, unspecified: Secondary | ICD-10-CM | POA: Diagnosis not present

## 2017-12-05 DIAGNOSIS — R945 Abnormal results of liver function studies: Secondary | ICD-10-CM | POA: Diagnosis not present

## 2017-12-08 ENCOUNTER — Other Ambulatory Visit: Payer: Self-pay | Admitting: Sports Medicine

## 2017-12-08 DIAGNOSIS — Z1231 Encounter for screening mammogram for malignant neoplasm of breast: Secondary | ICD-10-CM

## 2017-12-09 ENCOUNTER — Other Ambulatory Visit: Payer: Self-pay | Admitting: Family Medicine

## 2017-12-09 DIAGNOSIS — E559 Vitamin D deficiency, unspecified: Secondary | ICD-10-CM

## 2017-12-14 ENCOUNTER — Ambulatory Visit: Payer: Medicare Other

## 2017-12-14 ENCOUNTER — Ambulatory Visit: Payer: Medicare Other | Admitting: Family Medicine

## 2017-12-16 NOTE — Progress Notes (Deleted)
Office Visit Note  Patient: Norma [name]             Date of Birth: 13-Mar-1961           MRN: [id number]             PCP: Cleophas Dunker, DO Referring: Sela Hilding, MD Visit Date: 12/30/2017 Occupation: @GUAROCC @  Subjective:  No chief complaint on file.   History of Present Illness: Norma [name] is a 57 y.o. female ***   Activities of Daily Living:  Patient reports morning stiffness for *** {minute/hour:19697}.   Patient {ACTIONS;DENIES/REPORTS:21021675::"Denies"} nocturnal pain.  Difficulty dressing/grooming: {ACTIONS;DENIES/REPORTS:21021675::"Denies"} Difficulty climbing stairs: {ACTIONS;DENIES/REPORTS:21021675::"Denies"} Difficulty getting out of chair: {ACTIONS;DENIES/REPORTS:21021675::"Denies"} Difficulty using hands for taps, buttons, cutlery, and/or writing: {ACTIONS;DENIES/REPORTS:21021675::"Denies"}  No Rheumatology ROS completed.   PMFS History:  Patient Active Problem List   Diagnosis Date Noted  . Breast asymmetry 11/30/2017  . Plantar fasciitis of right foot 07/06/2017  . Iron deficiency anemia 03/29/2017  . Vitamin D deficiency 03/01/2017  . Cough 01/11/2017  . Environmental allergies 11/03/2016  . Flank pain, chronic 08/27/2014  . Other cyst of bone, right ankle and foot 06/21/2014  . Lumbar herniated disc 06/06/2014  . Depression   . Prediabetes 01/14/2014  . Spinal stenosis, lumbar region, with neurogenic claudication 11/21/2013  . Muscle cramps 06/21/2013  . Rash and nonspecific skin eruption 10/07/2012  . Decreased hearing 12/29/2011  . Eczema, dyshidrotic 06/02/2010  . FIBROIDS, UTERUS 10/24/2009  . GERD 06/09/2009  . Chronic pain syndrome 10/04/2008  . HIP PAIN, LEFT, CHRONIC 08/16/2007  . Hepatitis C carrier ([place]) 03/21/2007  . Obesity 06/16/2006  . HYPERTENSION, BENIGN SYSTEMIC 06/16/2006    Past Medical History:  Diagnosis Date  . Acid reflux   . Anxiety   . Arthritis   . Back pain   . Depression   . Diabetes  mellitus without complication ([place])    on meds  . Headache    stress headaches, migraines at times  . Hypertension   . Irregular heart beat   . Left shoulder pain   . Shortness of breath dyspnea    with exertion  . Tachyarrhythmia   . Trigger point of thoracic region 03/22/2012    Family History  Problem Relation Age of Onset  . Diabetes Brother   . Diabetes Maternal Aunt   . Diabetes Paternal Aunt   . Diabetes Maternal Grandmother   . Diabetes Maternal Grandfather   . Diabetes Paternal Grandmother   . Diabetes Paternal Grandfather   . Congestive Heart Failure Mother   . Hypertension Son    Past Surgical History:  Procedure Laterality Date  . ANKLE SURGERY    . LUMBAR LAMINECTOMY/DECOMPRESSION MICRODISCECTOMY Left 06/06/2014   Procedure: LUMBAR LAMINECTOMY/DECOMPRESSION MICRODISCECTOMY 1 LEVEL;  Surgeon: Newman Pies, MD;  Location: [facility];  Service: Neurosurgery;  Laterality: Left;  Left L5S1 microdiskectomy  . ROTATOR CUFF REPAIR Left 02/20/14  . TUBAL LIGATION     Social History   Social History Narrative  . Not on file    Objective: Vital Signs: There were no vitals taken for this visit.   Physical Exam   Musculoskeletal Exam: ***  CDAI Exam: CDAI Score: Not documented Patient Global Assessment: Not documented; Provider Global Assessment: Not documented Swollen: Not documented; Tender: Not documented Joint Exam   Not documented   There is currently no information documented on the homunculus. Go to the Rheumatology activity and complete the homunculus joint exam.  Investigation: No additional findings.  Imaging: No results found.  Recent Labs: Lab Results  Component Value Date   WBC 6.9 10/17/2017   HGB 10.0 (L) 10/17/2017   PLT 265 10/17/2017   NA 140 11/22/2017   K 3.7 11/22/2017   CL 101 11/22/2017   CO2 24 11/22/2017   GLUCOSE 120 (H) 11/22/2017   BUN 11 11/22/2017   CREATININE 0.88 11/22/2017   BILITOT 0.4 11/22/2017   ALKPHOS  59 11/22/2017   AST 54 (H) 11/22/2017   ALT 36 (H) 11/22/2017   PROT 7.1 11/22/2017   ALBUMIN 4.2 11/22/2017   CALCIUM 9.3 11/22/2017   GFRAA 84 11/22/2017    Speciality Comments: No specialty comments available.  Procedures:  No procedures performed Allergies: Soma [carisoprodol]; Nortriptyline; and Prednisone   Assessment / Plan:     Visit Diagnoses: Muscle cramps  Plantar fasciitis of right foot  Lumbar herniated disc  Hx of rotator cuff tear - Left  Spinal stenosis, lumbar region, with neurogenic claudication  Chronic pain syndrome  Vitamin D deficiency  Essential hypertension  History of gastroesophageal reflux (GERD)  Hepatitis C carrier ([place])  Prediabetes  Eczema, dyshidrotic   Orders: No orders of the defined types were placed in this encounter.  No orders of the defined types were placed in this encounter.   Face-to-face time spent with patient was *** minutes. Greater than 50% of time was spent in counseling and coordination of care.  Follow-Up Instructions: No follow-ups on file.   Ofilia Neas, PA-C  Note - This record has been created using Dragon software.  Chart creation errors have been sought, but may not always  have been located. Such creation errors do not reflect on  the standard of medical care.

## 2017-12-21 ENCOUNTER — Other Ambulatory Visit: Payer: Self-pay | Admitting: Family Medicine

## 2017-12-21 DIAGNOSIS — G8929 Other chronic pain: Secondary | ICD-10-CM

## 2017-12-21 DIAGNOSIS — M546 Pain in thoracic spine: Principal | ICD-10-CM

## 2017-12-22 ENCOUNTER — Encounter: Payer: Self-pay | Admitting: Family Medicine

## 2017-12-22 ENCOUNTER — Ambulatory Visit (INDEPENDENT_AMBULATORY_CARE_PROVIDER_SITE_OTHER): Payer: Medicare Other | Admitting: Family Medicine

## 2017-12-22 ENCOUNTER — Other Ambulatory Visit (HOSPITAL_COMMUNITY)
Admission: RE | Admit: 2017-12-22 | Discharge: 2017-12-22 | Disposition: A | Discharge status: Home / Self Care | Payer: Medicare Other | Source: Ambulatory Visit | Attending: Family Medicine | Admitting: Family Medicine

## 2017-12-22 ENCOUNTER — Other Ambulatory Visit: Payer: Self-pay

## 2017-12-22 VITALS — BP 108/58 | HR 91 | Temp 98.1°F | Ht 68.0 in | Wt 257.0 lb

## 2017-12-22 DIAGNOSIS — M722 Plantar fascial fibromatosis: Secondary | ICD-10-CM | POA: Diagnosis not present

## 2017-12-22 DIAGNOSIS — Z124 Encounter for screening for malignant neoplasm of cervix: Secondary | ICD-10-CM | POA: Insufficient documentation

## 2017-12-22 DIAGNOSIS — Z23 Encounter for immunization: Secondary | ICD-10-CM | POA: Diagnosis not present

## 2017-12-22 DIAGNOSIS — I10 Essential (primary) hypertension: Secondary | ICD-10-CM | POA: Diagnosis not present

## 2017-12-22 DIAGNOSIS — G8929 Other chronic pain: Secondary | ICD-10-CM | POA: Diagnosis not present

## 2017-12-22 DIAGNOSIS — E119 Type 2 diabetes mellitus without complications: Secondary | ICD-10-CM | POA: Diagnosis not present

## 2017-12-22 DIAGNOSIS — M5442 Lumbago with sciatica, left side: Secondary | ICD-10-CM | POA: Diagnosis not present

## 2017-12-22 DIAGNOSIS — Z79899 Other long term (current) drug therapy: Secondary | ICD-10-CM | POA: Diagnosis not present

## 2017-12-22 MED ORDER — DICLOFENAC SODIUM 1 % TD GEL: 2.0000 g | Freq: Two times a day (BID) | TRANSDERMAL | 1 refills | Status: DC | PRN

## 2017-12-22 NOTE — Patient Instructions (Addendum)
It was a pleasure to see you today! Thank you for choosing [facility] for your primary care. Norma [name] was seen for PAP smear.   Our plans for today were:  I will send a diclofenac gel refill.   We will call you if PAP is abnormal.   Rheumatology may have been the number we wrote down - their number is [phone] for Dr. Estanislado Pandy.   Sherri gave you the paper to reschedule your colonoscopy.   Best,  Dr. Lindell Noe

## 2017-12-22 NOTE — Progress Notes (Addendum)
   CC: needs PAP  HPI  Patient here for Pap.  She previously scheduled her Pap colonoscopy and then missed the appointment.  we will give her the paperwork to call them back again today.  She also notes that she has some right chronic knee pain and she has used the previously prescribed diclofenac for that and requests refill.  ROS: Denies CP, SOB, abdominal pain, dysuria, changes in BMs.   CC, SH/smoking status, and VS noted  Objective: BP (!) 108/58   Pulse 91   Temp 98.1 F (36.7 C) (Oral)   Ht 5\' 8"  (1.727 m)   Wt 257 lb (116.6 kg)   SpO2 97%   BMI 39.08 kg/m  Gen: NAD, alert, cooperative, and pleasant. HEENT: NCAT, EOMI, PERRL CV: RRR, no murmur Resp: CTAB, no wheezes, non-labored GU: Normal external female genitalia, cervix easily visualized, mildly hypo-pigmented vaginal mucosa no adnexal tenderness. Ext: No edema, warm Neuro: Alert and oriented, Speech clear, No gross deficits  Assessment and plan:  Pap performed today.  Diclofenac refilled at patient request.  Helped her reschedule her colonoscopy.  Gave flu shot today.  Needs PCP for diabetes management needs ophthalmology exam.  Ralene Ok, MD, PGY3 12/22/2017 4:16 PM

## 2017-12-26 LAB — CYTOLOGY - PAP
DIAGNOSIS: NEGATIVE
HPV (WINDOPATH): NOT DETECTED

## 2017-12-27 ENCOUNTER — Telehealth: Payer: Self-pay | Admitting: Family Medicine

## 2017-12-27 ENCOUNTER — Encounter: Payer: Self-pay | Admitting: Gastroenterology

## 2017-12-27 ENCOUNTER — Encounter: Payer: Self-pay | Admitting: Family Medicine

## 2017-12-27 NOTE — Telephone Encounter (Signed)
LVM for pt to call back and schedule their AWV. Please assist when calling back jw

## 2017-12-30 ENCOUNTER — Ambulatory Visit: Payer: Self-pay | Admitting: Rheumatology

## 2018-01-02 ENCOUNTER — Ambulatory Visit (INDEPENDENT_AMBULATORY_CARE_PROVIDER_SITE_OTHER): Payer: Medicare Other

## 2018-01-02 VITALS — BP 118/80 | HR 73 | Temp 98.3°F | Ht 68.0 in | Wt 252.4 lb

## 2018-01-02 DIAGNOSIS — Z0000 Encounter for general adult medical examination without abnormal findings: Secondary | ICD-10-CM

## 2018-01-02 DIAGNOSIS — H918X2 Other specified hearing loss, left ear: Secondary | ICD-10-CM

## 2018-01-02 NOTE — Progress Notes (Signed)
Subjective:   Norma [name] is a 56 y.o. female who presents for Medicare Annual (Subsequent) preventive examination.  Review of Systems:  Physical assessment deferred to PCP.  Cardiac Risk Factors include: diabetes mellitus;obesity (BMI >30kg/m2);sedentary lifestyle;hypertension    Objective:    Vitals: BP 118/80   Pulse 73   Temp 98.3 F (36.8 C) (Oral)   Ht 5\' 8"  (1.727 m)   Wt 252 lb 6.4 oz (114.5 kg)   SpO2 97%   BMI 38.38 kg/m   Body mass index is 38.38 kg/m.  Advanced Directives 01/02/2018 12/22/2017 11/30/2017 11/22/2017 10/17/2017 07/06/2017 06/08/2017  Does Patient Have a Medical Advance Directive? No No No No No No No  Would patient like information on creating a medical advance directive? No - Patient declined No - Patient declined No - Patient declined No - Patient declined No - Patient declined No - Patient declined No - Patient declined  Pre-existing out of facility DNR order (yellow form or pink MOST form) - - - - - - -    Tobacco Social History   Tobacco Use  Smoking Status Never Smoker  Smokeless Tobacco Never Used     Counseling given: Yes  Clinical Intake:  Pre-visit preparation completed: Yes  Pain : 0-10 Pain Score: 3  Pain Type: Chronic pain Pain Location: Back   Nutritional Status: BMI > 30  Obese Diabetes: No  How often do you need to have someone help you when you read instructions, pamphlets, or other written materials from your doctor or pharmacy?: 1 - Never What is the last grade level you completed in school?: 12  Interpreter Needed?: No  Past Medical History:  Diagnosis Date  . Acid reflux   . Anxiety   . Arthritis   . Back pain   . Depression   . Diabetes mellitus without complication ([place])    on meds  . Headache    stress headaches, migraines at times  . Hypertension   . Irregular heart beat   . Left shoulder pain   . Shortness of breath dyspnea    with exertion  . Tachyarrhythmia   . Trigger point of thoracic region  03/22/2012   Past Surgical History:  Procedure Laterality Date  . ANKLE SURGERY    . LUMBAR LAMINECTOMY/DECOMPRESSION MICRODISCECTOMY Left 06/06/2014   Procedure: LUMBAR LAMINECTOMY/DECOMPRESSION MICRODISCECTOMY 1 LEVEL;  Surgeon: Newman Pies, MD;  Location: [facility];  Service: Neurosurgery;  Laterality: Left;  Left L5S1 microdiskectomy  . ROTATOR CUFF REPAIR Left 02/20/14  . TUBAL LIGATION     Family History  Problem Relation Age of Onset  . Diabetes Brother   . Diabetes Maternal Aunt   . Diabetes Paternal Aunt   . Diabetes Maternal Grandmother   . Diabetes Maternal Grandfather   . Diabetes Paternal Grandmother   . Diabetes Paternal Grandfather   . Congestive Heart Failure Mother   . Hypertension Son    Social History   Socioeconomic History  . Marital status: Single    Spouse name: Not on file  . Number of children: 4  . Years of education: 53  . Highest education level: 12th grade  Occupational History  . Not on file  Social Needs  . Financial resource strain: Somewhat hard  . Food insecurity:    Worry: Often true    Inability: Often true  . Transportation needs:    Medical: No    Non-medical: No  Tobacco Use  . Smoking status: Never Smoker  .  Smokeless tobacco: Never Used  Substance and Sexual Activity  . Alcohol use: Yes    Comment: glass of wine occasionally  . Drug use: No    Comment: States no longer uses marijuana  . Sexual activity: Yes    Birth control/protection: Condom  Lifestyle  . Physical activity:    Days per week: 3 days    Minutes per session: 10 min  . Stress: To some extent  Relationships  . Social connections:    Talks on phone: More than three times a week    Gets together: Never    Attends religious service: 1 to 4 times per year    Active member of club or organization: No    Attends meetings of clubs or organizations: Never    Relationship status: Never married  Other Topics Concern  . Not on file  Social History  Narrative   Lives by herself, 33 year old son comes and goes. Has a pitbull dog named Duchess. Lives in one level house with two steps in back. Going to start going back to church. Likes to go out to eat and go to movies. Watches grandchildren, has six with two expected. Has occasional food insecurity.     Outpatient Encounter Medications as of 01/02/2018  Medication Sig  . ARIPiprazole (ABILIFY) 2 MG tablet Take 2 mg by mouth daily.  Marland Kitchen buPROPion (WELLBUTRIN XL) 300 MG 24 hr tablet Take 300 mg by mouth daily.  . Cholecalciferol (VITAMIN D) 2000 units CAPS Take 1 capsule (2,000 Units total) by mouth daily.  . cyclobenzaprine (FLEXERIL) 10 MG tablet TAKE 1 TABLET BY MOUTH TWICE A DAY AS NEEDED FOR MUSCLE SPASMS  . diclofenac sodium (VOLTAREN) 1 % GEL Apply 2 g topically 2 (two) times daily as needed (heel pain).  Marland Kitchen diltiazem (CARDIZEM SR) 60 MG 12 hr capsule Take 1 capsule (60 mg total) by mouth 2 (two) times daily.  Marland Kitchen esomeprazole (NEXIUM) 40 MG capsule Take 1 capsule (40 mg total) by mouth at bedtime.  . ferrous sulfate 325 (65 FE) MG tablet Take 1 tablet (325 mg total) by mouth 2 (two) times daily with a meal.  . fish oil-omega-3 fatty acids 1000 MG capsule Take 2 g by mouth daily.  . hydrochlorothiazide (HYDRODIURIL) 25 MG tablet Take 1 tablet (25 mg total) by mouth daily.  . hydrOXYzine (ATARAX/VISTARIL) 10 MG tablet Take 10 mg by mouth 3 (three) times daily as needed for itching. Reported on 08/26/2015  . isosorbide mononitrate (IMDUR) 30 MG 24 hr tablet Take 30 mg by mouth daily.  . meloxicam (MOBIC) 15 MG tablet take 1 tablet by mouth once daily for 7 days then if needed for pain  . metoprolol tartrate (LOPRESSOR) 25 MG tablet Take 1 tablet (25 mg total) by mouth 2 (two) times daily.  . multivitamin-iron-minerals-folic acid (CENTRUM) chewable tablet Chew 1 tablet daily by mouth.  Marland Kitchen omeprazole (PRILOSEC) 20 MG capsule Take 1 capsule (20 mg total) by mouth daily.  . traZODone (DESYREL)  100 MG tablet TAKE 2 TABLETS BY MOUTH EVERY NIGHT AT BEDTIME  . triamcinolone ointment (KENALOG) 0.5 % Apply 1 application topically 2 (two) times daily.  Marland Kitchen trolamine salicylate (ASPERCREME/ALOE) 10 % cream Apply 1 application topically as needed for muscle pain.  . Vitamin D, Ergocalciferol, (DRISDOL) 50000 units CAPS capsule TAKE 1 CAPSULE BY MOUTH EVERY 7 (SEVEN) DAYS.  . Magnesium Oxide -Mg Supplement 400 MG CAPS Take 1 capsule by mouth daily.  . metFORMIN (GLUCOPHAGE) 500 MG  tablet Take 500 mg by mouth 2 (two) times daily.  Marland Kitchen oxyCODONE-acetaminophen (PERCOCET) 10-325 MG tablet Take 1 tablet by mouth 3 (three) times daily as needed.  . potassium chloride (MICRO-K) 10 MEQ CR capsule Take 10 mEq by mouth daily.  . pregabalin (LYRICA) 100 MG capsule Take 1 capsule (100 mg total) by mouth 2 (two) times daily. (Patient not taking: Reported on 01/02/2018)   No facility-administered encounter medications on file as of 01/02/2018.     Activities of Daily Living In your present state of health, do you have any difficulty performing the following activities: 01/02/2018  Hearing? N  Comment some problem with left ear; hearing on the phone   Vision? Y  Difficulty concentrating or making decisions? N  Walking or climbing stairs? Y  Comment back and knee pain  Dressing or bathing? N  Doing errands, shopping? N  Preparing Food and eating ? N  Using the Toilet? N  In the past six months, have you accidently leaked urine? N  Do you have problems with loss of bowel control? N  Managing your Medications? N  Managing your Finances? N  Housekeeping or managing your Housekeeping? N  Some recent data might be hidden   Patient Care Team: Meccariello, Bernita Raisin, DO as PCP - General Inocencio Homes, DPM as Consulting Physician (Podiatry) Dixie Dials, MD as Consulting Physician (Cardiology) Sandi Mariscal, MD as Referring Physician (Internal Medicine)    Assessment:  This is a routine wellness examination for  Norma [name]. The patient was informed that the wellness visit is to identify future health risk and educate and initiate measures that can reduce risk for increased disease through the lifespan.   Exercise Activities and Dietary recommendations Current Exercise Habits: The patient does not participate in regular exercise at present, Exercise limited by: orthopedic condition(s)  Goals      Patient Stated   . Weight (lb) < 228 lb (103.4 kg) (pt-stated)     7% weight loss      Other   . exercise     Try to increase exercise to 4 days per week 30 minutes each time; currently limited by back and knee pain.       Fall Risk Fall Risk  01/02/2018 12/22/2017 11/30/2017 11/22/2017 10/17/2017  Falls in the past year? Yes Yes No No No  Number falls in past yr: 2 or more - - - -  Injury with Fall? No Yes - - -  Comment - - - - -  Risk Factor Category  - - - - -  Risk for fall due to : Impaired mobility - - - -  Risk for fall due to: Comment - - - - -  Follow up Falls prevention discussed;Education provided - - - -   Is the patient's home free of loose throw rugs in walkways, pet beds, electrical cords, etc?   yes      Grab bars in the bathroom? no      Handrails on the stairs?   yes      Adequate lighting?   yes  Patient always wears her seatbelt. She does not have firearms.  Depression Screen PHQ 2/9 Scores 01/02/2018 12/22/2017 11/30/2017 11/22/2017  PHQ - 2 Score 0 0 0 0  PHQ- 9 Score - - - -  Exception Documentation Other- indicate reason in comment box - - -  Not completed has depression; sees psychiatrist - - -    Patient states she sees mental health for depression.  Is on Abilify and Wellbutrin and "doing well."  Cognitive Function MMSE - Mini Mental State Exam 01/02/2018  Orientation to time 5  Orientation to Place 5  Registration 3  Attention/ Calculation 5  Recall 3  Language- name 2 objects 2  Language- repeat 1  Language- follow 3 step command 3  Language- read & follow direction  1  Write a sentence 1  Copy design 1  Total score 30     6CIT Screen 01/02/2018  What Year? 0 points  What month? 0 points  What time? 0 points  Count back from 20 0 points  Months in reverse 0 points  Repeat phrase 0 points  Total Score 0    Immunization History  Administered Date(s) Administered  . Influenza Split 01/22/2011, 01/31/2012  . Influenza Whole 02/07/2007, 01/19/2008, 02/07/2009, 01/19/2010  . Influenza,inj,Quad PF,6+ Mos 01/10/2013, 01/14/2014, 03/06/2015, 02/24/2017, 12/22/2017  . PPD Test 06/02/2010, 06/09/2010, 06/16/2011, 12/04/2012  . Td 01/17/2002  . Tdap 04/13/2015    Screening Tests Health Maintenance  Topic Date Due  . URINE MICROALBUMIN  11/29/1968  . MAMMOGRAM  09/17/2017  . COLONOSCOPY  01/23/2018 (Originally 11/29/2008)  . FOOT EXAM  02/16/2018 (Originally 11/29/1970)  . OPHTHALMOLOGY EXAM  02/16/2018 (Originally [birth year]29/1970)  . PNEUMOCOCCAL POLYSACCHARIDE VACCINE AGE 98-64 HIGH RISK  02/16/2018 (Originally [birth year]1960)  . HEMOGLOBIN A1C  05/25/2018  . PAP SMEAR  12/22/2020  . TETANUS/TDAP  04/12/2025  . INFLUENZA VACCINE  Completed  . Hepatitis C Screening  Completed  . HIV Screening  Completed    Cancer Screenings: Lung: Low Dose CT Chest recommended if Age 16-80 years, 30 pack-year currently smoking OR have quit w/in 15years. Patient does not qualify. Breast:  Up to date on Mammogram? No  Scheduled for 01/04/18 Up to date of Bone Density/Dexa? No Colorectal: Scheduled for 01/23/18  Additional Screenings:  Hepatitis C Screening: Needs to have done.  Plan:  Patient would like referral to audiology due to some hearing loss in her left ear. Referral was placed.   Appointment was made for 02/16/18 with PCP to address DM. Patient states she was pre-diabetic with last A1c here of 5.9. Dr Nancy Fetter tested her A1c and it was 6.9 and he started her on Metformin BID. Deferred foot exam and pneumovax until patient sees PCP on 02/16/18 to discuss whether she  has DM or not.   Patient would like to speak with LCSW, Casimer Lanius, regarding food resources. Will route chart to her.  I have personally reviewed and noted the following in the patient's chart:   . Medical and social history . Use of alcohol, tobacco or illicit drugs  . Current medications and supplements . Functional ability and status . Nutritional status . Physical activity . Advanced directives . List of other physicians . Hospitalizations, surgeries, and ER visits in previous 12 months . Vitals . Screenings to include cognitive, depression, and falls . Referrals and appointments  In addition, I have reviewed and discussed with patient certain preventive protocols, quality metrics, and best practice recommendations. A written personalized care plan for preventive services as well as general preventive health recommendations were provided to patient.     Esau Grew, RN  01/02/2018

## 2018-01-02 NOTE — Patient Instructions (Signed)
 Diabetes and Foot Care Diabetes may cause you to have problems because of poor blood supply (circulation) to your feet and legs. This may cause the skin on your feet to become thinner, break easier, and heal more slowly. Your skin may become dry, and the skin may peel and crack. You may also have nerve damage in your legs and feet causing decreased feeling in them. You may not notice minor injuries to your feet that could lead to infections or more serious problems. Taking care of your feet is one of the most important things you can do for yourself. Follow these instructions at home:  Wear shoes at all times, even in the house. Do not go barefoot. Bare feet are easily injured.  Check your feet daily for blisters, cuts, and redness. If you cannot see the bottom of your feet, use a mirror or ask someone for help.  Wash your feet with warm water (do not use hot water) and mild soap. Then pat your feet and the areas between your toes until they are completely dry. Do not soak your feet as this can dry your skin.  Apply a moisturizing lotion or petroleum jelly (that does not contain alcohol and is unscented) to the skin on your feet and to dry, brittle toenails. Do not apply lotion between your toes.  Trim your toenails straight across. Do not dig under them or around the cuticle. File the edges of your nails with an emery board or nail file.  Do not cut corns or calluses or try to remove them with medicine.  Wear clean socks or stockings every day. Make sure they are not too tight. Do not wear knee-high stockings since they may decrease blood flow to your legs.  Wear shoes that fit properly and have enough cushioning. To break in new shoes, wear them for just a few hours a day. This prevents you from injuring your feet. Always look in your shoes before you put them on to be sure there are no objects inside.  Do not cross your legs. This may decrease the blood flow to your feet.  If you find a  minor scrape, cut, or break in the skin on your feet, keep it and the skin around it clean and dry. These areas may be cleansed with mild soap and water. Do not cleanse the area with peroxide, alcohol, or iodine.  When you remove an adhesive bandage, be sure not to damage the skin around it.  If you have a wound, look at it several times a day to make sure it is healing.  Do not use heating pads or hot water bottles. They may burn your skin. If you have lost feeling in your feet or legs, you may not know it is happening until it is too late.  Make sure your health care provider performs a complete foot exam at least annually or more often if you have foot problems. Report any cuts, sores, or bruises to your health care provider immediately. Contact a health care provider if:  You have an injury that is not healing.  You have cuts or breaks in the skin.  You have an ingrown nail.  You notice redness on your legs or feet.  You feel burning or tingling in your legs or feet.  You have pain or cramps in your legs and feet.  Your legs or feet are numb.  Your feet always feel cold. Get help right away if:  There is   increasing redness, swelling, or pain in or around a wound.  There is a red line that goes up your leg.  Pus is coming from a wound.  You develop a fever or as directed by your health care provider.  You notice a bad smell coming from an ulcer or wound. This information is not intended to replace advice given to you by your health care provider. Make sure you discuss any questions you have with your health care provider. Document Released: 04/02/2000 Document Revised: 09/11/2015 Document Reviewed: 09/12/2012 Elsevier Interactive Patient Education  2017 [place] for Type 2 Diabetes A screening test for type 2 diabetes (type 2 diabetes mellitus) is a blood test to measure your blood sugar (glucose) level. This test is done to check for early signs of  diabetes, before you develop symptoms. Type 2 diabetes is a long-term (chronic) disease that occurs when the pancreas does not make enough of a hormone called insulin. This results in high blood glucose levels, which can cause many complications. You may be screened for type 2 diabetes as part of your regular health care, especially if you have a high risk for diabetes. Screening can help identify type 2 diabetes at its early stage (prediabetes). Identifying and treating prediabetes may delay or prevent development of type 2 diabetes. What are the risk factors for type 2 diabetes? The following factors may make you more likely to develop type 2 diabetes:  Having a parent or sibling (first-degree relative) who has diabetes.  Being overweight or obese.  Being of American-Indian, [place], Hispanic, Latino, Asian, or African-American descent.  Not getting enough exercise.  Being older than 88.  Having a history of diabetes during pregnancy (gestational diabetes).  Having low levels of good cholesterol (HDL-C) or high levels of blood fats (triglycerides).  Having high blood glucose in a previous blood test.  Having high blood pressure.  Having certain diseases or conditions, including: ? Acanthosis nigricans. This is a condition that causes dark skin on the neck, armpits, and groin. ? Polycystic ovary syndrome (PCOS). ? Heart disease.  Having delivered a baby who weighed more than 9 lb (4.1 kg).  Who should be screened for type 2 diabetes? Adults  Adults age 40 and older. These adults should be screened at least once every three years.  Adults who are younger than 70, overweight, and have at least one other risk factor. These adults should be screened at least once every three years.  Adults who have normal blood glucose levels and two or more risk factors. These adults may be screened once every year (annually).  Women who have had gestational diabetes in the past. These  women should be screened at least once every three years.  Pregnant women who have risk factors. These women should be screened at their first prenatal visit.  Pregnant women with no risk factors. These women should be screened between weeks 24 and 28 of pregnancy. Children and adolescents  Children and adolescents should be screened for type 2 diabetes if they are overweight and have 2 of the following risk factors: ? A family history of type 2 diabetes. ? Being a member of a high risk race or ethnic group. ? Signs of insulin resistance or conditions associated with insulin resistance. ? A mother who had gestational diabetes while pregnant with him or her.  Screening should be done at least once every three years, starting at age 65. Your health care provider or your child's health  care provider may recommend having a screening more or less often. What happens during screening? During screening, your health care provider may ask questions about:  Your health and your risk factors, including your activity level and any medical conditions that you have.  The health of your first-degree relatives.  Past pregnancies, if this applies.  Your health care provider will also do a physical exam, including a blood pressure measurement and blood tests. There are four blood tests that can be used to screen for type 2 diabetes. You may have one or more of the following:  A fasting plasma glucose test (FBG). You will not be allowed to eat for at least eight hours before a blood sample is taken.  A random blood glucose test. This test checks your blood glucose at any time of the day regardless of when you ate.  An oral glucose tolerance test (OGTT). This test measures your blood glucose at two times: ? After you have not eaten (have fasted) overnight. ? Two hours after you drink a glucose-containing beverage. A diagnosis can be made if the level is greater than 200 mg/dL.  An A1c test. This test  provides information about blood glucose control over the previous three months.  What do the results mean? Your test results are a measurement of how much glucose is in your blood. Normal blood glucose levels mean that you do not have diabetes or prediabetes. High blood glucose levels may mean that you have prediabetes or diabetes. Depending on the results, other tests may be needed to confirm the diagnosis. This information is not intended to replace advice given to you by your health care provider. Make sure you discuss any questions you have with your health care provider. Document Released: 01/30/2009 Document Revised: 09/11/2015 Document Reviewed: 01/31/2015 Elsevier Interactive Patient Education  2017 [place] Prevention in the Home Falls can cause injuries. They can happen to people of all ages. There are many things you can do to make your home safe and to help prevent falls. What can I do on the outside of my home?  Regularly fix the edges of walkways and driveways and fix any cracks.  Remove anything that might make you trip as you walk through a door, such as a raised step or threshold.  Trim any bushes or trees on the path to your home.  Use bright outdoor lighting.  Clear any walking paths of anything that might make someone trip, such as rocks or tools.  Regularly check to see if handrails are loose or broken. Make sure that both sides of any steps have handrails.  Any raised decks and porches should have guardrails on the edges.  Have any leaves, snow, or ice cleared regularly.  Use sand or salt on walking paths during winter.  Clean up any spills in your garage right away. This includes oil or grease spills. What can I do in the bathroom?  Use night lights.  Install grab bars by the toilet and in the tub and shower. Do not use towel bars as grab bars.  Use non-skid mats or decals in the tub or shower.  If you need to sit down in the shower, use a  plastic, non-slip stool.  Keep the floor dry. Clean up any water that spills on the floor as soon as it happens.  Remove soap buildup in the tub or shower regularly.  Attach bath mats securely with double-sided non-slip rug tape.  Do not have  throw rugs and other things on the floor that can make you trip. What can I do in the bedroom?  Use night lights.  Make sure that you have a light by your bed that is easy to reach.  Do not use any sheets or blankets that are too big for your bed. They should not hang down onto the floor.  Have a firm chair that has side arms. You can use this for support while you get dressed.  Do not have throw rugs and other things on the floor that can make you trip. What can I do in the kitchen?  Clean up any spills right away.  Avoid walking on wet floors.  Keep items that you use a lot in easy-to-reach places.  If you need to reach something above you, use a strong step stool that has a grab bar.  Keep electrical cords out of the way.  Do not use floor polish or wax that makes floors slippery. If you must use wax, use non-skid floor wax.  Do not have throw rugs and other things on the floor that can make you trip. What can I do with my stairs?  Do not leave any items on the stairs.  Make sure that there are handrails on both sides of the stairs and use them. Fix handrails that are broken or loose. Make sure that handrails are as long as the stairways.  Check any carpeting to make sure that it is firmly attached to the stairs. Fix any carpet that is loose or worn.  Avoid having throw rugs at the top or bottom of the stairs. If you do have throw rugs, attach them to the floor with carpet tape.  Make sure that you have a light switch at the top of the stairs and the bottom of the stairs. If you do not have them, ask someone to add them for you. What else can I do to help prevent falls?  Wear shoes that: ? Do not have high heels. ? Have  rubber bottoms. ? Are comfortable and fit you well. ? Are closed at the toe. Do not wear sandals.  If you use a stepladder: ? Make sure that it is fully opened. Do not climb a closed stepladder. ? Make sure that both sides of the stepladder are locked into place. ? Ask someone to hold it for you, if possible.  Clearly mark and make sure that you can see: ? Any grab bars or handrails. ? First and last steps. ? Where the edge of each step is.  Use tools that help you move around (mobility aids) if they are needed. These include: ? Canes. ? Walkers. ? Scooters. ? Crutches.  Turn on the lights when you go into a dark area. Replace any light bulbs as soon as they burn out.  Set up your furniture so you have a clear path. Avoid moving your furniture around.  If any of your floors are uneven, fix them.  If there are any pets around you, be aware of where they are.  Review your medicines with your doctor. Some medicines can make you feel dizzy. This can increase your chance of falling. Ask your doctor what other things that you can do to help prevent falls. This information is not intended to replace advice given to you by your health care provider. Make sure you discuss any questions you have with your health care provider. Document Released: 01/30/2009 Document Revised: 09/11/2015 Document Reviewed: 05/10/2014  Chartered certified accountant Patient Education  2018 [place] Maintenance, Female Adopting a healthy lifestyle and getting preventive care can go a long way to promote health and wellness. Talk with your health care provider about what schedule of regular examinations is right for you. This is a good chance for you to check in with your provider about disease prevention and staying healthy. In between checkups, there are plenty of things you can do on your own. Experts have done a lot of research about which lifestyle changes and preventive measures are most likely to keep  you healthy. Ask your health care provider for more information. Weight and diet Eat a healthy diet  Be sure to include plenty of vegetables, fruits, low-fat dairy products, and lean protein.  Do not eat a lot of foods high in solid fats, added sugars, or salt.  Get regular exercise. This is one of the most important things you can do for your health. ? Most adults should exercise for at least 150 minutes each week. The exercise should increase your heart rate and make you sweat (moderate-intensity exercise). ? Most adults should also do strengthening exercises at least twice a week. This is in addition to the moderate-intensity exercise.  Maintain a healthy weight  Body mass index (BMI) is a measurement that can be used to identify possible weight problems. It estimates body fat based on height and weight. Your health care provider can help determine your BMI and help you achieve or maintain a healthy weight.  For females 52 years of age and older: ? A BMI below 18.5 is considered underweight. ? A BMI of 18.5 to 24.9 is normal. ? A BMI of 25 to 29.9 is considered overweight. ? A BMI of 30 and above is considered obese.  Watch levels of cholesterol and blood lipids  You should start having your blood tested for lipids and cholesterol at 59 years of age, then have this test every 5 years.  You may need to have your cholesterol levels checked more often if: ? Your lipid or cholesterol levels are high. ? You are older than 59 years of age. ? You are at high risk for heart disease.  Cancer screening Lung Cancer  Lung cancer screening is recommended for adults 82-40 years old who are at high risk for lung cancer because of a history of smoking.  A yearly low-dose CT scan of the lungs is recommended for people who: ? Currently smoke. ? Have quit within the past 15 years. ? Have at least a 30-pack-year history of smoking. A pack year is smoking an average of one pack of cigarettes a  day for 1 year.  Yearly screening should continue until it has been 15 years since you quit.  Yearly screening should stop if you develop a health problem that would prevent you from having lung cancer treatment.  Breast Cancer  Practice breast self-awareness. This means understanding how your breasts normally appear and feel.  It also means doing regular breast self-exams. Let your health care provider know about any changes, no matter how small.  If you are in your 20s or 30s, you should have a clinical breast exam (CBE) by a health care provider every 1-3 years as part of a regular health exam.  If you are 49 or older, have a CBE every year. Also consider having a breast X-ray (mammogram) every year.  If you have a family history of breast cancer, talk to your health care provider  about genetic screening.  If you are at high risk for breast cancer, talk to your health care provider about having an MRI and a mammogram every year.  Breast cancer gene (BRCA) assessment is recommended for women who have family members with BRCA-related cancers. BRCA-related cancers include: ? Breast. ? Ovarian. ? Tubal. ? Peritoneal cancers.  Results of the assessment will determine the need for genetic counseling and BRCA1 and BRCA2 testing.  Cervical Cancer Your health care provider may recommend that you be screened regularly for cancer of the pelvic organs (ovaries, uterus, and vagina). This screening involves a pelvic examination, including checking for microscopic changes to the surface of your cervix (Pap test). You may be encouraged to have this screening done every 3 years, beginning at age 34.  For women ages 19-65, health care providers may recommend pelvic exams and Pap testing every 3 years, or they may recommend the Pap and pelvic exam, combined with testing for human papilloma virus (HPV), every 5 years. Some types of HPV increase your risk of cervical cancer. Testing for HPV may also be  done on women of any age with unclear Pap test results.  Other health care providers may not recommend any screening for nonpregnant women who are considered low risk for pelvic cancer and who do not have symptoms. Ask your health care provider if a screening pelvic exam is right for you.  If you have had past treatment for cervical cancer or a condition that could lead to cancer, you need Pap tests and screening for cancer for at least 20 years after your treatment. If Pap tests have been discontinued, your risk factors (such as having a new sexual partner) need to be reassessed to determine if screening should resume. Some women have medical problems that increase the chance of getting cervical cancer. In these cases, your health care provider may recommend more frequent screening and Pap tests.  Colorectal Cancer  This type of cancer can be detected and often prevented.  Routine colorectal cancer screening usually begins at 59 years of age and continues through 59 years of age.  Your health care provider may recommend screening at an earlier age if you have risk factors for colon cancer.  Your health care provider may also recommend using home test kits to check for hidden blood in the stool.  A small camera at the end of a tube can be used to examine your colon directly (sigmoidoscopy or colonoscopy). This is done to check for the earliest forms of colorectal cancer.  Routine screening usually begins at age 62.  Direct examination of the colon should be repeated every 5-10 years through 59 years of age. However, you may need to be screened more often if early forms of precancerous polyps or small growths are found.  Skin Cancer  Check your skin from head to toe regularly.  Tell your health care provider about any new moles or changes in moles, especially if there is a change in a mole's shape or color.  Also tell your health care provider if you have a mole that is larger than the  size of a pencil eraser.  Always use sunscreen. Apply sunscreen liberally and repeatedly throughout the day.  Protect yourself by wearing long sleeves, pants, a wide-brimmed hat, and sunglasses whenever you are outside.  Heart disease, diabetes, and high blood pressure  High blood pressure causes heart disease and increases the risk of stroke. High blood pressure is more likely to develop in: ?  People who have blood pressure in the high end of the normal range (130-139/85-89 mm Hg). ? People who are overweight or obese. ? People who are African American.  If you are 19-51 years of age, have your blood pressure checked every 3-5 years. If you are 76 years of age or older, have your blood pressure checked every year. You should have your blood pressure measured twice-once when you are at a hospital or clinic, and once when you are not at a hospital or clinic. Record the average of the two measurements. To check your blood pressure when you are not at a hospital or clinic, you can use: ? An automated blood pressure machine at a pharmacy. ? A home blood pressure monitor.  If you are between 66 years and 19 years old, ask your health care provider if you should take aspirin to prevent strokes.  Have regular diabetes screenings. This involves taking a blood sample to check your fasting blood sugar level. ? If you are at a normal weight and have a low risk for diabetes, have this test once every three years after 59 years of age. ? If you are overweight and have a high risk for diabetes, consider being tested at a younger age or more often. Preventing infection Hepatitis B  If you have a higher risk for hepatitis B, you should be screened for this virus. You are considered at high risk for hepatitis B if: ? You were born in a country where hepatitis B is common. Ask your health care provider which countries are considered high risk. ? Your parents were born in a high-risk country, and you have  not been immunized against hepatitis B (hepatitis B vaccine). ? You have HIV or AIDS. ? You use needles to inject street drugs. ? You live with someone who has hepatitis B. ? You have had sex with someone who has hepatitis B. ? You get hemodialysis treatment. ? You take certain medicines for conditions, including cancer, organ transplantation, and autoimmune conditions.  Hepatitis C  Blood testing is recommended for: ? Everyone born from [birth year] through 1967. ? Anyone with known risk factors for hepatitis C.  Sexually transmitted infections (STIs)  You should be screened for sexually transmitted infections (STIs) including gonorrhea and chlamydia if: ? You are sexually active and are younger than 59 years of age. ? You are older than 59 years of age and your health care provider tells you that you are at risk for this type of infection. ? Your sexual activity has changed since you were last screened and you are at an increased risk for chlamydia or gonorrhea. Ask your health care provider if you are at risk.  If you do not have HIV, but are at risk, it may be recommended that you take a prescription medicine daily to prevent HIV infection. This is called pre-exposure prophylaxis (PrEP). You are considered at risk if: ? You are sexually active and do not regularly use condoms or know the HIV status of your partner(s). ? You take drugs by injection. ? You are sexually active with a partner who has HIV.  Talk with your health care provider about whether you are at high risk of being infected with HIV. If you choose to begin PrEP, you should first be tested for HIV. You should then be tested every 3 months for as long as you are taking PrEP. Pregnancy  If you are premenopausal and you may become pregnant, ask your health care  provider about preconception counseling.  If you may become pregnant, take 400 to 800 micrograms (mcg) of folic acid every day.  If you want to prevent pregnancy, talk  to your health care provider about birth control (contraception). Osteoporosis and menopause  Osteoporosis is a disease in which the bones lose minerals and strength with aging. This can result in serious bone fractures. Your risk for osteoporosis can be identified using a bone density scan.  If you are 18 years of age or older, or if you are at risk for osteoporosis and fractures, ask your health care provider if you should be screened.  Ask your health care provider whether you should take a calcium or vitamin D supplement to lower your risk for osteoporosis.  Menopause may have certain physical symptoms and risks.  Hormone replacement therapy may reduce some of these symptoms and risks. Talk to your health care provider about whether hormone replacement therapy is right for you. Follow these instructions at home:  Schedule regular health, dental, and eye exams.  Stay current with your immunizations.  Do not use any tobacco products including cigarettes, chewing tobacco, or electronic cigarettes.  If you are pregnant, do not drink alcohol.  If you are breastfeeding, limit how much and how often you drink alcohol.  Limit alcohol intake to no more than 1 drink per day for nonpregnant women. One drink equals 12 ounces of beer, 5 ounces of wine, or 1 ounces of hard liquor.  Do not use street drugs.  Do not share needles.  Ask your health care provider for help if you need support or information about quitting drugs.  Tell your health care provider if you often feel depressed.  Tell your health care provider if you have ever been abused or do not feel safe at home. This information is not intended to replace advice given to you by your health care provider. Make sure you discuss any questions you have with your health care provider. Document Released: 10/19/2010 Document Revised: 09/11/2015 Document Reviewed: 01/07/2015 Elsevier Interactive Patient Education  [employer].

## 2018-01-04 ENCOUNTER — Ambulatory Visit
Admission: RE | Admit: 2018-01-04 | Discharge: 2018-01-04 | Disposition: A | Discharge status: Home / Self Care | Payer: Medicare Other | Source: Ambulatory Visit | Attending: Sports Medicine | Admitting: Sports Medicine

## 2018-01-04 DIAGNOSIS — Z1231 Encounter for screening mammogram for malignant neoplasm of breast: Secondary | ICD-10-CM | POA: Diagnosis not present

## 2018-01-05 ENCOUNTER — Encounter: Payer: Self-pay | Admitting: Family Medicine

## 2018-01-05 ENCOUNTER — Telehealth: Payer: Self-pay | Admitting: Licensed Clinical Social Worker

## 2018-01-05 NOTE — Telephone Encounter (Signed)
[facility] intern called to FU with patient in reference to refferal from Dr. Dayna Ramus regarding food resources and Food Box program. Left message for patient to call [facility] intern.  Will follow up in 5-7 business days if no response from patient. Audry Riles MSW Social Work Intern [employer] [phone]

## 2018-01-06 ENCOUNTER — Telehealth: Payer: Self-pay | Admitting: *Deleted

## 2018-01-06 NOTE — Telephone Encounter (Signed)
Pt informed of below. Zimmerman Rumple, Brianna Esson D, CMA  

## 2018-01-06 NOTE — Telephone Encounter (Signed)
-----   Message from Cleophas Dunker, DO sent at 01/05/2018  9:26 PM EDT ----- Mammogram normal.  Results state that result letter will be mailed directly to patient.

## 2018-01-09 DIAGNOSIS — G8929 Other chronic pain: Secondary | ICD-10-CM | POA: Diagnosis not present

## 2018-01-09 DIAGNOSIS — M5442 Lumbago with sciatica, left side: Secondary | ICD-10-CM | POA: Diagnosis not present

## 2018-01-11 ENCOUNTER — Ambulatory Visit: Payer: Self-pay | Admitting: Rheumatology

## 2018-01-12 ENCOUNTER — Telehealth: Payer: Self-pay | Admitting: Family Medicine

## 2018-01-12 ENCOUNTER — Ambulatory Visit (AMBULATORY_SURGERY_CENTER): Payer: Self-pay

## 2018-01-12 VITALS — Ht 68.0 in | Wt 257.8 lb

## 2018-01-12 DIAGNOSIS — Z1211 Encounter for screening for malignant neoplasm of colon: Secondary | ICD-10-CM

## 2018-01-12 NOTE — Telephone Encounter (Signed)
Pt is calling and would like to have a referral to have a MRI done since she is still in so much pain. Please call patient to discuss.

## 2018-01-12 NOTE — Telephone Encounter (Signed)
Spoke with patient.  She stated that she has had back pain in the past that acutely worsened last week.  She received a Toradol injection at [facility] and this improved pain for 2 days, but states that her pain is back to 8/10 again.  She is taking Aleve Back & Muscle Pain regularly with minimal relief.  She has seen neurosurgery in the past and last MRI was 2015.    Patient given ED precautions with red flag symptoms.  Agrees to come to clinic tomorrow to be seen and assessed for need for MRI.  Please call patient in the AM to schedule an appointment.

## 2018-01-12 NOTE — Progress Notes (Signed)
Denies allergies to eggs or soy products. Denies complication of anesthesia or sedation. Denies use of weight loss medication. Denies use of O2.   Emmi instructions declined.  

## 2018-01-13 NOTE — Telephone Encounter (Signed)
Contacted pt and told her that we did not have any open slots until 01/23/18 and she said she could not wait that long that she may have to go somewhere else to get the referral.  I told her she could check with [place] each day to see if there were any cancellations that we could put her in.  She said she would call Monday. Katharina Caper, April D, [place]

## 2018-01-18 ENCOUNTER — Telehealth: Payer: Self-pay | Admitting: Licensed Clinical Social Worker

## 2018-01-18 NOTE — Progress Notes (Signed)
Type of Service: Clinical Social Work  [facility] intern phone call to patient regarding a referral for food box program. [facility] intern explained the program to the patient and patient was not interested in this program at this time. [facility] intern left a follow up number with the client and explained to her my role and encouraged her to reach out if further assistance is needed.     Plan: 1. Patient will follow up with [facility] intern if assistance is needed regarding resources   Audry Riles, [place],  [place] [phone]

## 2018-01-19 DIAGNOSIS — I10 Essential (primary) hypertension: Secondary | ICD-10-CM | POA: Diagnosis not present

## 2018-01-19 DIAGNOSIS — Z79899 Other long term (current) drug therapy: Secondary | ICD-10-CM | POA: Diagnosis not present

## 2018-01-19 DIAGNOSIS — M5442 Lumbago with sciatica, left side: Secondary | ICD-10-CM | POA: Diagnosis not present

## 2018-01-19 DIAGNOSIS — G8929 Other chronic pain: Secondary | ICD-10-CM | POA: Diagnosis not present

## 2018-01-19 DIAGNOSIS — E119 Type 2 diabetes mellitus without complications: Secondary | ICD-10-CM | POA: Diagnosis not present

## 2018-01-20 NOTE — Progress Notes (Addendum)
  Intern assessed patient's needs and barriers.   I have conferenced with intern on interventions and agree with the plan in note dated10/02/19.  Casimer Lanius, [place]   [phone] 8:33 AM

## 2018-01-23 ENCOUNTER — Encounter: Payer: Medicare Other | Admitting: Gastroenterology

## 2018-01-25 ENCOUNTER — Ambulatory Visit (AMBULATORY_SURGERY_CENTER): Payer: Medicare Other | Admitting: Gastroenterology

## 2018-01-25 ENCOUNTER — Encounter: Payer: Self-pay | Admitting: Gastroenterology

## 2018-01-25 VITALS — BP 148/93 | HR 79 | Temp 97.5°F | Resp 14 | Ht 68.0 in | Wt 257.0 lb

## 2018-01-25 DIAGNOSIS — D123 Benign neoplasm of transverse colon: Secondary | ICD-10-CM | POA: Diagnosis not present

## 2018-01-25 DIAGNOSIS — Z1211 Encounter for screening for malignant neoplasm of colon: Secondary | ICD-10-CM | POA: Diagnosis not present

## 2018-01-25 DIAGNOSIS — D124 Benign neoplasm of descending colon: Secondary | ICD-10-CM

## 2018-01-25 MED ORDER — SODIUM CHLORIDE 0.9 % IV SOLN: 500.0000 mL | Freq: Once | INTRAVENOUS | Status: DC

## 2018-01-25 NOTE — Op Note (Signed)
[facility] Patient Name: Norma [name] Procedure Date: 01/25/2018 11:53 AM MRN: [id number] Endoscopist: Mauri Pole , MD Age: 57 Referring MD:  Date of Birth: June 19, 1960 Gender: Female Account #: [id number] Procedure:                Colonoscopy Indications:              Screening for colorectal malignant neoplasm Medicines:                Monitored Anesthesia Care Procedure:                Pre-Anesthesia Assessment:                           - Prior to the procedure, a History and Physical                            was performed, and patient medications and                            allergies were reviewed. The patient's tolerance of                            previous anesthesia was also reviewed. The risks                            and benefits of the procedure and the sedation                            options and risks were discussed with the patient.                            All questions were answered, and informed consent                            was obtained. Prior Anticoagulants: The patient has                            taken no previous anticoagulant or antiplatelet                            agents. ASA Grade Assessment: II - A patient with                            mild systemic disease. After reviewing the risks                            and benefits, the patient was deemed in                            satisfactory condition to undergo the procedure.                           After obtaining informed consent, the colonoscope  was passed under direct vision. Throughout the                            procedure, the patient's blood pressure, pulse, and                            oxygen saturations were monitored continuously. The                            Colonoscope was introduced through the anus and                            advanced to the the cecum, identified by                            appendiceal orifice  and ileocecal valve. The                            colonoscopy was performed without difficulty. The                            patient tolerated the procedure well. The quality                            of the bowel preparation was excellent. The                            ileocecal valve, appendiceal orifice, and rectum                            were photographed. Scope In: 11:59:30 AM Scope Out: 12:19:06 PM Scope Withdrawal Time: 0 hours 15 minutes 36 seconds  Total Procedure Duration: 0 hours 19 minutes 36 seconds  Findings:                 The perianal and digital rectal examinations were                            normal.                           Two pedunculated polyps were found in the                            descending colon and transverse colon. The polyps                            were 12 to 14 mm in size. These polyps were removed                            with a hot snare. Resection and retrieval were                            complete.  A 5 mm polyp was found in the transverse colon. The                            polyp was sessile. The polyp was removed with a                            cold snare. Resection and retrieval were complete.                           Scattered small-mouthed diverticula were found in                            the sigmoid colon, descending colon and ascending                            colon.                           Non-bleeding internal hemorrhoids were found during                            retroflexion. The hemorrhoids were small. Complications:            No immediate complications. Estimated Blood Loss:     Estimated blood loss was minimal. Impression:               - Two 12 to 14 mm polyps in the descending colon                            and in the transverse colon, removed with a hot                            snare. Resected and retrieved.                           - One 5 mm polyp in the  transverse colon, removed                            with a cold snare. Resected and retrieved.                           - Diverticulosis in the sigmoid colon, in the                            descending colon and in the ascending colon.                           - Non-bleeding internal hemorrhoids. Recommendation:           - Patient has a contact number available for                            emergencies. The signs and symptoms of potential  delayed complications were discussed with the                            patient. Return to normal activities tomorrow.                            Written discharge instructions were provided to the                            patient.                           - Resume previous diet.                           - Continue present medications.                           - Await pathology results.                           - No aspirin, ibuprofen, naproxen, or other                            non-steroidal anti-inflammatory drugs for 2 weeks.                           - Repeat colonoscopy in 3 years for surveillance                            based on pathology results. Mauri Pole, MD 01/25/2018 12:22:30 PM This report has been signed electronically.

## 2018-01-25 NOTE — Patient Instructions (Signed)
YOU HAD AN ENDOSCOPIC PROCEDURE TODAY AT THE [facility]:   Refer to the procedure report that was given to you for any specific questions about what was found during the examination.  If the procedure report does not answer your questions, please call your gastroenterologist to clarify.  If you requested that your care partner not be given the details of your procedure findings, then the procedure report has been included in a sealed envelope for you to review at your convenience later.  YOU SHOULD EXPECT: Some feelings of bloating in the abdomen. Passage of more gas than usual.  Walking can help get rid of the air that was put into your GI tract during the procedure and reduce the bloating. If you had a lower endoscopy (such as a colonoscopy or flexible sigmoidoscopy) you may notice spotting of blood in your stool or on the toilet paper. If you underwent a bowel prep for your procedure, you may not have a normal bowel movement for a few days.  Please Note:  You might notice some irritation and congestion in your nose or some drainage.  This is from the oxygen used during your procedure.  There is no need for concern and it should clear up in a day or so.  SYMPTOMS TO REPORT IMMEDIATELY:   Following lower endoscopy (colonoscopy or flexible sigmoidoscopy):  Excessive amounts of blood in the stool  Significant tenderness or worsening of abdominal pains  Swelling of the abdomen that is new, acute  Fever of 100F or higher  For urgent or emergent issues, a gastroenterologist can be reached at any hour by calling [phone].   DIET:  We do recommend a small meal at first, but then you may proceed to your regular diet.  Drink plenty of fluids but you should avoid alcoholic beverages for 24 hours.  MEDICATIONS: Continue present medications. No Aspirin, Ibuprofen, Naproxen, or other non-steroidal anti-inflammatory drugs for 2 weeks after polyp removal.  Please see handouts given to  you by your recovery nurse.  ACTIVITY:  You should plan to take it easy for the rest of today and you should NOT DRIVE or use heavy machinery until tomorrow (because of the sedation medicines used during the test).    FOLLOW UP: Our staff will call the number listed on your records the next business day following your procedure to check on you and address any questions or concerns that you may have regarding the information given to you following your procedure. If we do not reach you, we will leave a message.  However, if you are feeling well and you are not experiencing any problems, there is no need to return our call.  We will assume that you have returned to your regular daily activities without incident.  If any biopsies were taken you will be contacted by phone or by letter within the next 1-3 weeks.  Please call us at [phone] if you have not heard about the biopsies in 3 weeks.   Thank you for allowing us to provide for your healthcare needs today.  SIGNATURES/CONFIDENTIALITY: You and/or your care partner have signed paperwork which will be entered into your electronic medical record.  These signatures attest to the fact that that the information above on your After Visit Summary has been reviewed and is understood.  Full responsibility of the confidentiality of this discharge information lies with you and/or your care-partner. 

## 2018-01-25 NOTE — Progress Notes (Signed)
Pt's states no medical or surgical changes since previsit or office visit. 

## 2018-01-25 NOTE — Progress Notes (Signed)
Called to room to assist during endoscopic procedure.  Patient ID and intended procedure confirmed with present staff. Received instructions for my participation in the procedure from the performing physician.  

## 2018-01-25 NOTE — Progress Notes (Signed)
Alert and oriented x3, pleased with MAC, report to RN  

## 2018-01-26 ENCOUNTER — Telehealth: Payer: Self-pay

## 2018-01-26 NOTE — Telephone Encounter (Signed)
  Follow up Call-  Call back number 01/25/2018  Post procedure Call Back phone  # [phone]  Permission to leave phone message Yes  Some recent data might be hidden     Patient questions:  Do you have a fever, pain , or abdominal swelling? No. Pain Score  0 *  Have you tolerated food without any problems? Yes.    Have you been able to return to your normal activities? Yes.    Do you have any questions about your discharge instructions: Diet   No. Medications  No. Follow up visit  No.  Do you have questions or concerns about your Care? No.  Actions: * If pain score is 4 or above: No action needed, pain <4.

## 2018-01-31 ENCOUNTER — Encounter: Payer: Self-pay | Admitting: Gastroenterology

## 2018-02-09 ENCOUNTER — Telehealth: Payer: Self-pay | Admitting: Gastroenterology

## 2018-02-09 NOTE — Telephone Encounter (Signed)
Left message on machine to call back  

## 2018-02-10 NOTE — Telephone Encounter (Signed)
The pt was advised her polyp was PRE cancerous NOT cancer.  The pt has been advised of the information and verbalized understanding.

## 2018-02-10 NOTE — Telephone Encounter (Signed)
Left message on machine to call back  

## 2018-02-10 NOTE — Telephone Encounter (Signed)
Pt returne call from yesterday for results

## 2018-02-16 ENCOUNTER — Encounter: Payer: Self-pay | Admitting: Family Medicine

## 2018-02-16 ENCOUNTER — Other Ambulatory Visit: Payer: Self-pay

## 2018-02-16 ENCOUNTER — Ambulatory Visit: Payer: Medicare Other | Admitting: Rheumatology

## 2018-02-16 ENCOUNTER — Ambulatory Visit (INDEPENDENT_AMBULATORY_CARE_PROVIDER_SITE_OTHER): Payer: Medicare Other | Admitting: Family Medicine

## 2018-02-16 VITALS — BP 124/76 | HR 80 | Temp 98.4°F | Ht 68.0 in | Wt 254.8 lb

## 2018-02-16 DIAGNOSIS — R7303 Prediabetes: Secondary | ICD-10-CM

## 2018-02-16 DIAGNOSIS — G43019 Migraine without aura, intractable, without status migrainosus: Secondary | ICD-10-CM

## 2018-02-16 LAB — POCT GLYCOSYLATED HEMOGLOBIN (HGB A1C): Hemoglobin A1C: 5.7 % — AB (ref 4.0–5.6)

## 2018-02-16 MED ORDER — KETOROLAC TROMETHAMINE 30 MG/ML IJ SOLN
30.0000 mg | Freq: Once | INTRAMUSCULAR | Status: AC
  Administered 2018-02-16: 30 mg via INTRAMUSCULAR

## 2018-02-16 NOTE — Addendum Note (Signed)
Addended by: Owens Shark, Kyrie Bun on: 02/16/2018 08:31 PM   Modules accepted: Level of Service

## 2018-02-16 NOTE — Assessment & Plan Note (Addendum)
Given that patient's headache similar to previous and neurologically intact on exam, suspect this is a migraine.  Last Cr 0.88 in August.  Orthostatic vitals reviewed and negative. - Toradol injection today - patient to use tylenol or ibuprofen for pain - advised to call if pain continues - advised that if pain worsens or changes to return to care, at that time would consider imaging

## 2018-02-16 NOTE — Assessment & Plan Note (Signed)
A1c today 5.7.  Continues to have prediabetes.  Has been taking Metformin 500mg  QD.  Patient educated on proper diet and ways to save money while eating proper diet. - continue metformin QD - return in 6 months for follow up

## 2018-02-16 NOTE — Patient Instructions (Signed)
It was great to see you.  We have given you a shot for your headache.  If it does not go away, let us know.  If it changes or gets worse, call or go to the ED.  Continue to take metformin daily and remember it's important to follow a diet low in carbs and high in veggies (frozen ones are great)!  We will check your A1c today and let you know of the results!  Best, Dr. Jerilynn Mages

## 2018-02-16 NOTE — Progress Notes (Signed)
Subjective: Chief Complaint  Patient presents with  . Prediabetes     HPI: Norma [name] is a 57 y.o. presenting to clinic today to discuss the following:  1 Headache Patient notes that she has had a headache for the past four days.  She states that she has had migraines in the past and that this feels similar.  She notes that the pain in in the front of her head.  She has associated photophobia and phonophobia.  She has been taking percocet and tylenol for the pain.  She notes that it feels better when she is lying down, and worsens she is standing or bending over.  She also states that she becomes dizzy upon standing.  It is also worse with reading.  The patient notes that she needs new glasses and that she has an appointment scheduled for next week.  She denies changes in her vision, LOC, weakness, numbness, tingling.  2 Pre-diabetes Patient's last Hemoglobin A1c noted to be 5.9 on 8/6.  The day prior she had an A1c checked at her pain management doctor's office and was told it was 6.9, for which she was started on Metformin 500mg  BID.  Dr. Andy Gauss had recommended patient take Metformin QD given her A1c at our clinic.  She has been taking Metformin QD without complaints.  She has been "trying to eat healthy, but I can't afford all of those foods they tell you to eat."  She notes that she has been eating baked chicken and baked fish.  She has also been decreasing her bread consumption.  Health Maintenance: Patient recently had colonoscopy, pap,  and mammogram.     ROS noted in HPI.   Past Medical, Surgical, Social, and Family History Reviewed & Updated per EMR.   Pertinent Historical Findings include:   Social History   Tobacco Use  Smoking Status Never Smoker  Smokeless Tobacco Never Used      Objective: BP 124/76   Pulse 80   Temp 98.4 F (36.9 C) (Oral)   Ht 5\' 8"  (1.727 m)   Wt 254 lb 12.8 oz (115.6 kg)   SpO2 98%   BMI 38.74 kg/m  Vitals and nursing notes  reviewed  Orthostatic VS for the past 24 hrs:  BP- Lying Pulse- Lying BP- Sitting Pulse- Sitting BP- Standing at 0 minutes Pulse- Standing at 0 minutes  02/16/18 1536 120/78 80 112/82 80 122/88 83     Physical Exam: General: 57 y.o. female in NAD HEENT: PERRL Cardio: RRR no m/r/g Lungs: CTAB, no wheezing, no rhonchi, no crackles Abdomen: Soft, non-tender to palpation, positive bowel sounds Skin: warm and dry Extremities: No edema Neuro: CN II-XII intact, 5/5 strength BUE/BLE, sensation intact throughout    Results for orders placed or performed in visit on 02/16/18 (from the past 72 hour(s))  HgB A1c     Status: Abnormal   Collection Time: 02/16/18  4:14 PM  Result Value Ref Range   Hemoglobin A1C 5.7 (A) 4.0 - 5.6 %   HbA1c POC (<> result, manual entry)     HbA1c, POC (prediabetic range)     HbA1c, POC (controlled diabetic range)      Assessment/Plan:  Intractable migraine without aura and without status migrainosus Given that patient's headache similar to previous and neurologically intact on exam, suspect this is a migraine.  Last Cr 0.88 in August.  Orthostatic vitals reviewed and negative. - Toradol injection today - patient to use tylenol or ibuprofen for  pain - advised to call if pain continues - advised that if pain worsens or changes to return to care, at that time would consider imaging  Prediabetes A1c today 5.7.  Continues to have prediabetes.  Has been taking Metformin 500mg  QD.  Patient educated on proper diet and ways to save money while eating proper diet. - continue metformin QD - return in 6 months for follow up     PATIENT EDUCATION PROVIDED: See AVS    Diagnosis and plan along with any newly prescribed medication(s) were discussed in detail with this patient today. The patient verbalized understanding and agreed with the plan. Patient advised if symptoms worsen return to clinic or ER.   Health Maintainance: UTD   Orders Placed This Encounter    Procedures  . HgB A1c    Meds ordered this encounter  Medications  . ketorolac (TORADOL) 30 MG/ML injection 30 mg     [place] Constable, DO 02/16/2018, 8:28 PM PGY-1 [place]

## 2018-02-20 DIAGNOSIS — M5442 Lumbago with sciatica, left side: Secondary | ICD-10-CM | POA: Diagnosis not present

## 2018-02-20 DIAGNOSIS — Z79899 Other long term (current) drug therapy: Secondary | ICD-10-CM | POA: Diagnosis not present

## 2018-02-20 DIAGNOSIS — I10 Essential (primary) hypertension: Secondary | ICD-10-CM | POA: Diagnosis not present

## 2018-02-20 DIAGNOSIS — G8929 Other chronic pain: Secondary | ICD-10-CM | POA: Diagnosis not present

## 2018-02-22 ENCOUNTER — Other Ambulatory Visit: Payer: Self-pay | Admitting: Family Medicine

## 2018-02-22 DIAGNOSIS — M549 Dorsalgia, unspecified: Secondary | ICD-10-CM

## 2018-03-06 DIAGNOSIS — H40003 Preglaucoma, unspecified, bilateral: Secondary | ICD-10-CM | POA: Diagnosis not present

## 2018-03-22 DIAGNOSIS — E119 Type 2 diabetes mellitus without complications: Secondary | ICD-10-CM | POA: Diagnosis not present

## 2018-03-22 DIAGNOSIS — E7800 Pure hypercholesterolemia, unspecified: Secondary | ICD-10-CM | POA: Diagnosis not present

## 2018-03-22 DIAGNOSIS — G8929 Other chronic pain: Secondary | ICD-10-CM | POA: Diagnosis not present

## 2018-03-22 DIAGNOSIS — M5442 Lumbago with sciatica, left side: Secondary | ICD-10-CM | POA: Diagnosis not present

## 2018-03-22 DIAGNOSIS — I10 Essential (primary) hypertension: Secondary | ICD-10-CM | POA: Diagnosis not present

## 2018-03-22 DIAGNOSIS — Z79899 Other long term (current) drug therapy: Secondary | ICD-10-CM | POA: Diagnosis not present

## 2018-04-21 DIAGNOSIS — E119 Type 2 diabetes mellitus without complications: Secondary | ICD-10-CM | POA: Diagnosis not present

## 2018-04-21 DIAGNOSIS — E7800 Pure hypercholesterolemia, unspecified: Secondary | ICD-10-CM | POA: Diagnosis not present

## 2018-04-21 DIAGNOSIS — G8929 Other chronic pain: Secondary | ICD-10-CM | POA: Diagnosis not present

## 2018-04-21 DIAGNOSIS — M5442 Lumbago with sciatica, left side: Secondary | ICD-10-CM | POA: Diagnosis not present

## 2018-04-21 DIAGNOSIS — Z79899 Other long term (current) drug therapy: Secondary | ICD-10-CM | POA: Diagnosis not present

## 2018-04-28 DIAGNOSIS — M5442 Lumbago with sciatica, left side: Secondary | ICD-10-CM | POA: Diagnosis not present

## 2018-04-28 DIAGNOSIS — Z79899 Other long term (current) drug therapy: Secondary | ICD-10-CM | POA: Diagnosis not present

## 2018-04-28 DIAGNOSIS — M25571 Pain in right ankle and joints of right foot: Secondary | ICD-10-CM | POA: Diagnosis not present

## 2018-04-28 DIAGNOSIS — G8929 Other chronic pain: Secondary | ICD-10-CM | POA: Diagnosis not present

## 2018-05-01 ENCOUNTER — Other Ambulatory Visit: Payer: Self-pay | Admitting: Family Medicine

## 2018-05-01 DIAGNOSIS — K219 Gastro-esophageal reflux disease without esophagitis: Secondary | ICD-10-CM

## 2018-05-08 DIAGNOSIS — E119 Type 2 diabetes mellitus without complications: Secondary | ICD-10-CM | POA: Diagnosis not present

## 2018-05-08 DIAGNOSIS — I10 Essential (primary) hypertension: Secondary | ICD-10-CM | POA: Diagnosis not present

## 2018-05-08 DIAGNOSIS — R072 Precordial pain: Secondary | ICD-10-CM | POA: Diagnosis not present

## 2018-05-10 DIAGNOSIS — L853 Xerosis cutis: Secondary | ICD-10-CM | POA: Diagnosis not present

## 2018-05-10 DIAGNOSIS — L219 Seborrheic dermatitis, unspecified: Secondary | ICD-10-CM | POA: Diagnosis not present

## 2018-05-29 DIAGNOSIS — Z79899 Other long term (current) drug therapy: Secondary | ICD-10-CM | POA: Diagnosis not present

## 2018-05-29 DIAGNOSIS — G8929 Other chronic pain: Secondary | ICD-10-CM | POA: Diagnosis not present

## 2018-05-29 DIAGNOSIS — E7800 Pure hypercholesterolemia, unspecified: Secondary | ICD-10-CM | POA: Diagnosis not present

## 2018-05-29 DIAGNOSIS — E119 Type 2 diabetes mellitus without complications: Secondary | ICD-10-CM | POA: Diagnosis not present

## 2018-05-29 DIAGNOSIS — M5442 Lumbago with sciatica, left side: Secondary | ICD-10-CM | POA: Diagnosis not present

## 2018-06-19 DIAGNOSIS — L853 Xerosis cutis: Secondary | ICD-10-CM | POA: Diagnosis not present

## 2018-06-19 DIAGNOSIS — L219 Seborrheic dermatitis, unspecified: Secondary | ICD-10-CM | POA: Diagnosis not present

## 2018-06-23 ENCOUNTER — Other Ambulatory Visit: Payer: Self-pay

## 2018-06-23 ENCOUNTER — Encounter: Payer: Self-pay | Admitting: Family Medicine

## 2018-06-23 ENCOUNTER — Ambulatory Visit (INDEPENDENT_AMBULATORY_CARE_PROVIDER_SITE_OTHER): Payer: Medicare Other | Admitting: Family Medicine

## 2018-06-23 VITALS — BP 124/70 | Temp 98.5°F | Wt 254.0 lb

## 2018-06-23 DIAGNOSIS — K5730 Diverticulosis of large intestine without perforation or abscess without bleeding: Secondary | ICD-10-CM

## 2018-06-23 DIAGNOSIS — R1032 Left lower quadrant pain: Secondary | ICD-10-CM

## 2018-06-23 DIAGNOSIS — R1031 Right lower quadrant pain: Secondary | ICD-10-CM | POA: Insufficient documentation

## 2018-06-23 DIAGNOSIS — M19041 Primary osteoarthritis, right hand: Secondary | ICD-10-CM | POA: Insufficient documentation

## 2018-06-23 NOTE — Assessment & Plan Note (Signed)
Heberden's node consistent with osteoarthritis.  Patient sees pain management and takes percocet.  Has a prescription for volatren gel at home.  Advised her to use as needed for pain and to avoid getting in her eyes.

## 2018-06-23 NOTE — Assessment & Plan Note (Addendum)
No red flag symptoms.  Likely 2/2 diverticulosis, no evidence of diverticulitis given no blood in stool and no fevers, therefore antibiotics not recommended at this time.  Likely not ovarian cyst given patient is menopausal.  No urinary symptoms and less likely UTI given unilateral.  Reassured as abdomen is non-tender on exam aside form epigastric region which is likely 2/2 chronic GERD.  Recent colonoscopy. - start fiber supplementation - return precautions given, patient voiced understanding

## 2018-06-23 NOTE — Progress Notes (Signed)
Subjective: Chief Complaint  Patient presents with  . Abdominal Pain     HPI: Norma [name] is a 58 y.o. presenting to clinic today to discuss the following:  1 Abdominal Pain States that for the last 3 weeks she has been having sharp stabbing pains in left lower quadrant.  Comes and goes.  Occurs 4-5 times a day.  Nothing that makes it worse.  Sits down when it occurs.  Lasts about 10 minutes.  She states she occasionally feels something "moving" in her stomach since colonoscopy 01/2018.  Pain doesn't worsen after eating.  Occurs more in the mid-day to afternoon.  Still eating a regular diet without difficulty.  No diarrhea or constipation.  No hematochezia or melena.  BM this AM and states that they are regular.  No vomiting, nausea, fevers.  No new weight changes.  Her colonoscopy in 01/2018 should diverticula and patient was told to avoid nuts, which she states that she has been doing.  Patient is menopausal.  She has not had any new vaginal bleeding, changes in vaginal discharge, dysuria, hematuria, flank pain.  Has a history of IBS, but states "this feels different."  Rates the pain 7/10 when occurring, but is 0/10 at present.   2 Swelling in Right fingers Has been there for years in the right middle finger distal joint. In the past few months has notinced increased swelling in other joints.  Has pain and states that they feel achy mostly at night.  Would like to know what she can do for this.      ROS noted in HPI.   Past Medical, Surgical, Social, and Family History Reviewed & Updated per EMR.   Pertinent Historical Findings include:   Social History   Tobacco Use  Smoking Status Never Smoker  Smokeless Tobacco Never Used      Objective: BP 124/70   Temp 98.5 F (36.9 C) (Oral)   Wt 254 lb (115.2 kg)   BMI 38.62 kg/m  Vitals and nursing notes reviewed  Physical Exam: General: 58 y.o. female in NAD Cardio: RRR no m/r/g Lungs: CTAB, no wheezing, no rhonchi,  no crackles Abdomen: Soft, mildly TTP epigastric region, positive bowel sounds, no masses palpated Skin: warm and dry Extremities: No edema, Heberden's node right 3rd finger, no other deformities noted, no pain with ROM of DIP in all digits   No results found for this or any previous visit (from the past 72 hour(s)).  Assessment/Plan:  Abdominal pain, left lower quadrant No red flag symptoms.  Likely 2/2 diverticulosis, no evidence of diverticulitis given no blood in stool and no fevers, therefore antibiotics not recommended at this time.  Likely not ovarian cyst given patient is menopausal.  No urinary symptoms and less likely UTI given unilateral.  Reassured as abdomen is non-tender on exam aside form epigastric region which is likely 2/2 chronic GERD.  Recent colonoscopy. - start fiber supplementation - return precautions given, patient voiced understanding  Arthritis of finger of right hand Heberden's node consistent with osteoarthritis.  Patient sees pain management and takes percocet.  Has a prescription for volatren gel at home.  Advised her to use as needed for pain and to avoid getting in her eyes.   Patient to return in 1 month for follow up  PATIENT EDUCATION PROVIDED: See AVS    Diagnosis and plan along with any newly prescribed medication(s) were discussed in detail with this patient today. The patient verbalized understanding and agreed with  the plan. Patient advised if symptoms worsen return to clinic or ER.     No orders of the defined types were placed in this encounter.   No orders of the defined types were placed in this encounter.    [place] Constable, DO 06/23/2018, 12:13 PM PGY-1 [place]

## 2018-06-23 NOTE — Patient Instructions (Signed)
Thank you for coming to see me today. It was a pleasure. Today we talked about:   Your stomach pain: Get Metamucil or Benefiber and use that at least once a day.  If your pain is not better in 2 weeks, come back.  Your hand pain: use the voltaren gel on your joints for pain.  Make sure you don't get it in your eyes!  Please follow-up with me in 1 months for check up and labs.  If you have any questions or concerns, please do not hesitate to call the office at [phone].  Best,   Arizona Constable, DO

## 2018-06-27 DIAGNOSIS — Z79899 Other long term (current) drug therapy: Secondary | ICD-10-CM | POA: Diagnosis not present

## 2018-06-27 DIAGNOSIS — I10 Essential (primary) hypertension: Secondary | ICD-10-CM | POA: Diagnosis not present

## 2018-06-27 DIAGNOSIS — R5383 Other fatigue: Secondary | ICD-10-CM | POA: Diagnosis not present

## 2018-06-27 DIAGNOSIS — E7800 Pure hypercholesterolemia, unspecified: Secondary | ICD-10-CM | POA: Diagnosis not present

## 2018-06-27 DIAGNOSIS — M5442 Lumbago with sciatica, left side: Secondary | ICD-10-CM | POA: Diagnosis not present

## 2018-06-27 DIAGNOSIS — E119 Type 2 diabetes mellitus without complications: Secondary | ICD-10-CM | POA: Diagnosis not present

## 2018-06-27 DIAGNOSIS — G8929 Other chronic pain: Secondary | ICD-10-CM | POA: Diagnosis not present

## 2018-07-27 DIAGNOSIS — M5442 Lumbago with sciatica, left side: Secondary | ICD-10-CM | POA: Diagnosis not present

## 2018-07-27 DIAGNOSIS — E119 Type 2 diabetes mellitus without complications: Secondary | ICD-10-CM | POA: Diagnosis not present

## 2018-07-27 DIAGNOSIS — G8929 Other chronic pain: Secondary | ICD-10-CM | POA: Diagnosis not present

## 2018-07-27 DIAGNOSIS — Z79899 Other long term (current) drug therapy: Secondary | ICD-10-CM | POA: Diagnosis not present

## 2018-07-27 DIAGNOSIS — E7800 Pure hypercholesterolemia, unspecified: Secondary | ICD-10-CM | POA: Diagnosis not present

## 2018-08-25 DIAGNOSIS — I480 Paroxysmal atrial fibrillation: Secondary | ICD-10-CM | POA: Diagnosis not present

## 2018-08-25 DIAGNOSIS — G8929 Other chronic pain: Secondary | ICD-10-CM | POA: Diagnosis not present

## 2018-08-25 DIAGNOSIS — I10 Essential (primary) hypertension: Secondary | ICD-10-CM | POA: Diagnosis not present

## 2018-08-25 DIAGNOSIS — Z79899 Other long term (current) drug therapy: Secondary | ICD-10-CM | POA: Diagnosis not present

## 2018-08-25 DIAGNOSIS — M5442 Lumbago with sciatica, left side: Secondary | ICD-10-CM | POA: Diagnosis not present

## 2018-08-29 ENCOUNTER — Ambulatory Visit: Payer: Medicare Other | Admitting: Family Medicine

## 2018-08-30 ENCOUNTER — Ambulatory Visit: Payer: Medicare Other | Admitting: Family Medicine

## 2018-09-01 ENCOUNTER — Other Ambulatory Visit: Payer: Self-pay

## 2018-09-01 ENCOUNTER — Encounter: Payer: Self-pay | Admitting: Family Medicine

## 2018-09-01 ENCOUNTER — Ambulatory Visit (INDEPENDENT_AMBULATORY_CARE_PROVIDER_SITE_OTHER): Payer: Medicare Other | Admitting: Family Medicine

## 2018-09-01 VITALS — BP 154/82 | HR 88

## 2018-09-01 DIAGNOSIS — M549 Dorsalgia, unspecified: Secondary | ICD-10-CM

## 2018-09-01 DIAGNOSIS — R1032 Left lower quadrant pain: Secondary | ICD-10-CM

## 2018-09-01 MED ORDER — TRAZODONE HCL 100 MG PO TABS: ORAL_TABLET | ORAL | 0 refills | Status: DC

## 2018-09-01 MED ORDER — HYDROXYZINE HCL 10 MG PO TABS: 10.0000 mg | ORAL_TABLET | Freq: Three times a day (TID) | ORAL | 2 refills | Status: DC | PRN

## 2018-09-01 NOTE — Patient Instructions (Signed)
Thank you for coming in to see [place] today! Please see below to review our plan for today's visit:  1. Increase Miralax to twice daily to see if that will help with your left lower abdominal pain. If not, please contact us and let us know. 2. If the pain returns, we will perform a digital rectal exam before refering you back to the GI physician who performed your colonoscopy and may consider pelvic ultrasound.  3. If anything changes, please don't hesitate to reach out to [place]!  Please call the clinic at [phone] if your symptoms worsen or you have any concerns. It was our pleasure to serve you!    Dr. Milus Banister [facility]

## 2018-09-07 NOTE — Progress Notes (Signed)
   Subjective:   Patient ID: Norma [name], female    DOB: [birth year], 58 y.o.   MRN: [id number]  CC: LLQ abdominal pain  HPI: Patient says she has 3 weeks of LLQ abdominal pain that is sharp in nature and non-radiating. Previous visit 06/23/2018 patient presented with exact same complaint and story. Pain is intermittent and not every day. Patient is not experiencing pain at this time. Pain gets worse with movement and and is relieved with tylenol and aleve. Patient is urinating and stooling well, reports stools are normal and without signs of constipation, diarrhea, melena or hematochezia. Has a history of kidney stones but denies the pain mimics a kidney stone. Denies back pain at this time. Denies nausea, vomiting, chest pain, and shortness of breath.   Review of Systems: see HPI  Objective:  BP (!) 154/82   Pulse 88   SpO2 97%  Vitals and nursing note reviewed  General: well nourished, in no acute distress Neck: supple, non-tender, without lymphadenopathy Cardiac: RRR, clear S1 and S2, no murmurs, rubs, or gallops Respiratory: clear to auscultation bilaterally, no increased work of breathing Abdomen: soft, nontender, nondistended, no masses or organomegaly. Bowel sounds present Neuro: alert and oriented, no focal deficits  Assessment & Plan:   Abdominal pain, left lower quadrant No concerning symptoms, pain most likely 2/2 diverticulosis, afebrile, as patient stated pain is currently gone decided not to check CBC for leukocytosis. Patient menopausal, therefore most likely not ovarian cause of pain/ovarian cyst. Patient had colonoscopy Oct 2019 with polyps (removed) and told to return in 3 years. 1. Increase Miralax to twice daily to see if that will help with LLQ abdominal pain. Patient voiced understanding of contact precautions. 2. If the pain returns, plan to perform digital rectal exam before refering patient back to the GI physician who performed colonoscopy. May consider pelvic  ultrasound.   Return if symptoms worsen or fail to improve.  Dr. Milus Banister [facility], PGY-1

## 2018-09-07 NOTE — Assessment & Plan Note (Signed)
No concerning symptoms, pain most likely 2/2 diverticulosis, afebrile, as patient stated pain is currently gone decided not to check CBC for leukocytosis. Patient menopausal, therefore most likely not ovarian cause of pain/ovarian cyst. Patient had colonoscopy Oct 2019 with polyps (removed) and told to return in 3 years. 1. Increase Miralax to twice daily to see if that will help with LLQ abdominal pain. Patient voiced understanding of contact precautions. 2. If the pain returns, plan to perform digital rectal exam before refering patient back to the GI physician who performed colonoscopy. May consider pelvic ultrasound.

## 2018-09-25 DIAGNOSIS — M5442 Lumbago with sciatica, left side: Secondary | ICD-10-CM | POA: Diagnosis not present

## 2018-09-25 DIAGNOSIS — E7800 Pure hypercholesterolemia, unspecified: Secondary | ICD-10-CM | POA: Diagnosis not present

## 2018-09-25 DIAGNOSIS — E559 Vitamin D deficiency, unspecified: Secondary | ICD-10-CM | POA: Diagnosis not present

## 2018-09-25 DIAGNOSIS — G8929 Other chronic pain: Secondary | ICD-10-CM | POA: Diagnosis not present

## 2018-09-25 DIAGNOSIS — Z79899 Other long term (current) drug therapy: Secondary | ICD-10-CM | POA: Diagnosis not present

## 2018-10-24 DIAGNOSIS — R5383 Other fatigue: Secondary | ICD-10-CM | POA: Diagnosis not present

## 2018-10-24 DIAGNOSIS — M129 Arthropathy, unspecified: Secondary | ICD-10-CM | POA: Diagnosis not present

## 2018-10-24 DIAGNOSIS — Z79899 Other long term (current) drug therapy: Secondary | ICD-10-CM | POA: Diagnosis not present

## 2018-10-24 DIAGNOSIS — E559 Vitamin D deficiency, unspecified: Secondary | ICD-10-CM | POA: Diagnosis not present

## 2018-10-24 DIAGNOSIS — I480 Paroxysmal atrial fibrillation: Secondary | ICD-10-CM | POA: Diagnosis not present

## 2018-10-24 DIAGNOSIS — Z0000 Encounter for general adult medical examination without abnormal findings: Secondary | ICD-10-CM | POA: Diagnosis not present

## 2018-10-24 DIAGNOSIS — M5442 Lumbago with sciatica, left side: Secondary | ICD-10-CM | POA: Diagnosis not present

## 2018-10-24 DIAGNOSIS — E119 Type 2 diabetes mellitus without complications: Secondary | ICD-10-CM | POA: Diagnosis not present

## 2018-11-06 ENCOUNTER — Encounter: Payer: Self-pay | Admitting: Family Medicine

## 2018-11-06 ENCOUNTER — Other Ambulatory Visit: Payer: Self-pay

## 2018-11-06 ENCOUNTER — Ambulatory Visit (INDEPENDENT_AMBULATORY_CARE_PROVIDER_SITE_OTHER): Payer: Medicare Other | Admitting: Family Medicine

## 2018-11-06 VITALS — BP 118/66 | HR 93

## 2018-11-06 DIAGNOSIS — R1031 Right lower quadrant pain: Secondary | ICD-10-CM | POA: Diagnosis not present

## 2018-11-06 DIAGNOSIS — R232 Flushing: Secondary | ICD-10-CM

## 2018-11-06 MED ORDER — NAPROXEN 500 MG PO TABS: 500.0000 mg | ORAL_TABLET | Freq: Two times a day (BID) | ORAL | 0 refills | Status: AC

## 2018-11-06 NOTE — Patient Instructions (Signed)
Thank you for coming to see me today. It was a pleasure. Today we talked about:   Your abdominal pain: Come back tomorrow anytime between 8:30 and 12:30 or 1:30 and 4:30pm for labs.  We will call you with your results.  Take the naproxen for 7 days.   If you have any questions or concerns, please do not hesitate to call the office at [phone].  Best,   [place] Constable, DO

## 2018-11-06 NOTE — Assessment & Plan Note (Signed)
Has been having hot flashes for many years with menopause.  Will check TSH to ensure not thyroid disorder, although previously WNL in 2018.

## 2018-11-06 NOTE — Progress Notes (Signed)
Subjective: Chief Complaint  Patient presents with  . Rt ovary pain     HPI: Norma [name] is a 58 y.o. presenting to clinic today to discuss the following:  1 RLQ Pain Patient reports that she is having constant RLQ pain x3 days.  She states "I was having it on the left side, but that went away.  Then Friday night I started having real intense pain on the right."   Pain worsens with straining with BM.  Has some mild improvement after BM, but "I can still feel it hurt."  Has been using her percocet for pain, and has some mild improvement.  No fevers, no vomiting, no diarrhea, no hematochezia, no melena, no nausea.  Peeing normally, no hematuria.  History of kidney stones, unsure if it feels the same.  Had been constipated, but started using Miralax in the last 2 weeks and has had improvement.  Has had LLQ pain in the past, but has never had it on the right side before.  Has been eating well and denies anorexia.  Food does not worsen the pain.  Went through menopause at the age of 1-40.  She reports that she is still having hot flashes.  Last Colonoscopy 01/2018.  Few polyps, diverticulosis, and internal hemorrhoids.    Also notes that about two weeks ago she noticed a "boil" on her right lower abdomen that drained spontaneously purulent fluid with a foul odor.  She states that it is now improved and just a small open sore.  She denies the area having surrounding redness. No fevers or chills.     ROS noted in HPI.   Past Medical, Surgical, Social, and Family History Reviewed & Updated per EMR.   Pertinent Historical Findings include:   Social History   Tobacco Use  Smoking Status Never Smoker  Smokeless Tobacco Never Used      Objective: BP 118/66   Pulse 93   SpO2 96%  Vitals and nursing notes reviewed  Physical Exam:  General: 58 y.o. female in NAD Cardio: RRR no m/r/g Lungs: CTAB, no wheezing, no rhonchi, no crackles, no IWOB on RA Abdomen: Soft, TTP RLQ,  non-distended, positive bowel sounds, no rebound, negative Rovsing's, no CVA tenderness Skin: warm and dry, 0.5cm ulceration on right lower abdomen without surrounding erythema or purulent drainage Extremities: No edema       No results found for this or any previous visit (from the past 72 hour(s)).  Assessment/Plan:  Abdominal pain, right lower quadrant Doubt ovarian cyst given patient is >20 yrs post-menopausal.  No evidence of nephrolithiasis.  Doubt appendicitis, given Alvarado score of 2 (without CBC), although will obtain CBC to r/o infection/appendicitis.  Suspect that pain may be 2/2 IBS, as patient has multiple visits in the past for LLQ pain.  Recent colonoscopy 01/2018.  CT Abd/Pelvis in 2018 does note possible uterine mass and to have non-emergent pelvic US, therefore could consider if CBC without elevated WBC.  Will also prescribe naproxen for 7 days to see if some improvement in pain with this.  The excoriation on her abdomen does not correlate to her pain and it is without evidence of cellulitis, therefore do not think that this is related to her abdominal pain.  Hot flashes Has been having hot flashes for many years with menopause.  Will check TSH to ensure not thyroid disorder, although previously WNL in 2018.     PATIENT EDUCATION PROVIDED: See AVS    Diagnosis and  plan along with any newly prescribed medication(s) were discussed in detail with this patient today. The patient verbalized understanding and agreed with the plan. Patient advised if symptoms worsen return to clinic or ER.   Health Maintainance: return in 1 month for DM, HTN, HLD f/u.  Plan to check lipid panel and likely start statin   Orders Placed This Encounter  Procedures  . CBC with Differential    Standing Status:   Future    Standing Expiration Date:   11/06/2019  . Comprehensive metabolic panel    Standing Status:   Future    Standing Expiration Date:   11/06/2019  . TSH    Standing Status:    Future    Standing Expiration Date:   11/06/2019    Meds ordered this encounter  Medications  . naproxen (NAPROSYN) 500 MG tablet    Sig: Take 1 tablet (500 mg total) by mouth 2 (two) times daily with a meal for 7 days.    Dispense:  14 tablet    Refill:  [place], DO 11/06/2018, 6:46 PM PGY-2 [place]

## 2018-11-06 NOTE — Assessment & Plan Note (Addendum)
Doubt ovarian cyst given patient is >20 yrs post-menopausal.  No evidence of nephrolithiasis.  Doubt appendicitis, given Alvarado score of 2 (without CBC), although will obtain CBC to r/o infection/appendicitis.  Suspect that pain may be 2/2 IBS, as patient has multiple visits in the past for LLQ pain.  Recent colonoscopy 01/2018.  CT Abd/Pelvis in 2018 does note possible uterine mass and to have non-emergent pelvic US, therefore could consider if CBC without elevated WBC.  Will also prescribe naproxen for 7 days to see if some improvement in pain with this.  The excoriation on her abdomen does not correlate to her pain and it is without evidence of cellulitis, therefore do not think that this is related to her abdominal pain.

## 2018-11-07 ENCOUNTER — Other Ambulatory Visit: Payer: Medicare Other

## 2018-11-07 DIAGNOSIS — R232 Flushing: Secondary | ICD-10-CM

## 2018-11-07 DIAGNOSIS — R1031 Right lower quadrant pain: Secondary | ICD-10-CM

## 2018-11-07 DIAGNOSIS — I10 Essential (primary) hypertension: Secondary | ICD-10-CM | POA: Diagnosis not present

## 2018-11-08 ENCOUNTER — Other Ambulatory Visit: Payer: Self-pay | Admitting: Family Medicine

## 2018-11-08 DIAGNOSIS — N858 Other specified noninflammatory disorders of uterus: Secondary | ICD-10-CM

## 2018-11-08 LAB — CBC WITH DIFFERENTIAL/PLATELET
Basophils Absolute: 0 10*3/uL (ref 0.0–0.2)
Basos: 1 %
EOS (ABSOLUTE): 0.2 10*3/uL (ref 0.0–0.4)
Eos: 3 %
Hematocrit: 40.6 % (ref 34.0–46.6)
Hemoglobin: 13.8 g/dL (ref 11.1–15.9)
Immature Grans (Abs): 0 10*3/uL (ref 0.0–0.1)
Immature Granulocytes: 0 %
Lymphocytes Absolute: 2.5 10*3/uL (ref 0.7–3.1)
Lymphs: 45 %
MCH: 29.6 pg (ref 26.6–33.0)
MCHC: 34 g/dL (ref 31.5–35.7)
MCV: 87 fL (ref 79–97)
Monocytes Absolute: 0.4 10*3/uL (ref 0.1–0.9)
Monocytes: 7 %
Neutrophils Absolute: 2.4 10*3/uL (ref 1.4–7.0)
Neutrophils: 44 %
Platelets: 196 10*3/uL (ref 150–450)
RBC: 4.66 x10E6/uL (ref 3.77–5.28)
RDW: 13.5 % (ref 11.7–15.4)
WBC: 5.5 10*3/uL (ref 3.4–10.8)

## 2018-11-08 LAB — COMPREHENSIVE METABOLIC PANEL
ALT: 28 IU/L (ref 0–32)
AST: 41 IU/L — ABNORMAL HIGH (ref 0–40)
Albumin/Globulin Ratio: 1.5 (ref 1.2–2.2)
Albumin: 4.3 g/dL (ref 3.8–4.9)
Alkaline Phosphatase: 63 IU/L (ref 39–117)
BUN/Creatinine Ratio: 11 (ref 9–23)
BUN: 9 mg/dL (ref 6–24)
Bilirubin Total: 0.4 mg/dL (ref 0.0–1.2)
CO2: 21 mmol/L (ref 20–29)
Calcium: 9.3 mg/dL (ref 8.7–10.2)
Chloride: 103 mmol/L (ref 96–106)
Creatinine, Ser: 0.79 mg/dL (ref 0.57–1.00)
GFR calc Af Amer: 95 mL/min/{1.73_m2} (ref >59)
GFR calc non Af Amer: 82 mL/min/{1.73_m2} (ref >59)
Globulin, Total: 2.8 g/dL (ref 1.5–4.5)
Glucose: 130 mg/dL — ABNORMAL HIGH (ref 65–99)
Potassium: 3.8 mmol/L (ref 3.5–5.2)
Sodium: 142 mmol/L (ref 134–144)
Total Protein: 7.1 g/dL (ref 6.0–8.5)

## 2018-11-08 LAB — TSH: TSH: 2.51 u[IU]/mL (ref 0.450–4.500)

## 2018-11-08 NOTE — Progress Notes (Signed)
Order placed for pelvic US as CT Abd/Pelvis 2018 had recommended as follow up for possible uterine mass.  WBC WNL and RLQ pain improving.  See result note.

## 2018-11-17 ENCOUNTER — Telehealth: Payer: Self-pay | Admitting: Family Medicine

## 2018-11-17 ENCOUNTER — Telehealth (INDEPENDENT_AMBULATORY_CARE_PROVIDER_SITE_OTHER): Payer: Medicare Other | Admitting: Family Medicine

## 2018-11-17 ENCOUNTER — Encounter: Payer: Self-pay | Admitting: Family Medicine

## 2018-11-17 ENCOUNTER — Other Ambulatory Visit: Payer: Self-pay

## 2018-11-17 DIAGNOSIS — R0982 Postnasal drip: Secondary | ICD-10-CM | POA: Diagnosis not present

## 2018-11-17 DIAGNOSIS — J309 Allergic rhinitis, unspecified: Secondary | ICD-10-CM

## 2018-11-17 MED ORDER — LORATADINE 10 MG PO TABS: 10.0000 mg | ORAL_TABLET | Freq: Every day | ORAL | 0 refills | Status: DC

## 2018-11-17 NOTE — Progress Notes (Signed)
[place] Telemedicine Visit  Patient consented to have virtual visit. Method of visit: Telephone  Encounter participants: Patient: Norma [name] - located at home Provider: Cleophas Dunker - located at [facility] Others (if applicable): none  Chief Complaint: sore throat  HPI: Notes that about 1 week ago she had a tooth extracted and afterwards started to feel like throat was swollen and it is mildly sore.  She states that her tongue looks a little swollen and she can't see to the back of her throat.  Denies shortness of breath.  Coughed up some mildly green mucus this AM, but otherwise has not been able to cough.  "Felt like something had been on my throat, but I couldn't get it out until this morning."  No congestion, fevers.  Does note some mild fatigue and overall not feeling well.  No known sick contacts.  Chloraseptic spray and warm salt water used with only a few minutes of improvement.  Yesterday was the worst, but today she is improving.  This AM she states that L>R pain.  Has also been taking tylenol for the pain with mild improvement.    When she had tooth extracted she states that she had laughing gas.  The next day she started having pain in her throat.  She went back a few days later and was having tooth pain and it was repacked.    Eating and Drinking normally.  Notes that her biggest concern is the sore throat.  ROS: per HPI  Pertinent PMHx: HTN, Prediabetes  Exam:  Respiratory: Speaking in complete sentences on the phone without evidence of respiratory distress  Assessment/Plan:  Allergic rhinitis with postnasal drip Suspect allergic rhinitis.  No fevers, only productive cough x1, therefore doubt bacterial sinus infection, no congestion.  Largest concern is scratchy throat, no SOB, therefore will treat for allergies.  Return precautions given including worsening pain, swelling of throat, SOB.  If no improvement in next 2 days, advised to call  for appointment to be seen.  Doubt angioedema.  No ACE/ARB on med list.  Unsure if tongue truly swollen, patient is speaking normally over the phone which is reassuring, possibility of trauma during dental procedure.  Could also consider dental infection, although would not expect to occur the day after her procedure, would have expected to have been at least 2-3 days afterwards.  No known sick contacts, therefore doubt COVID-19 or other illness, but could consider if does not improve.    Time spent during visit with patient: 13 minutes

## 2018-11-17 NOTE — Telephone Encounter (Signed)
Pt had a virtual visit this morning but wanted to let her PCP know that she has to clear her throat a lot.

## 2018-11-17 NOTE — Assessment & Plan Note (Signed)
Suspect allergic rhinitis.  No fevers, only productive cough x1, therefore doubt bacterial sinus infection, no congestion.  Largest concern is scratchy throat, no SOB, therefore will treat for allergies.  Return precautions given including worsening pain, swelling of throat, SOB.  If no improvement in next 2 days, advised to call for appointment to be seen.  Doubt angioedema.  No ACE/ARB on med list.  Unsure if tongue truly swollen, patient is speaking normally over the phone which is reassuring, possibility of trauma during dental procedure.  Could also consider dental infection, although would not expect to occur the day after her procedure, would have expected to have been at least 2-3 days afterwards.  No known sick contacts, therefore doubt COVID-19 or other illness, but could consider if does not improve.

## 2018-11-22 ENCOUNTER — Telehealth: Payer: Self-pay | Admitting: *Deleted

## 2018-11-22 ENCOUNTER — Ambulatory Visit
Admission: RE | Admit: 2018-11-22 | Discharge: 2018-11-22 | Disposition: A | Discharge status: Home / Self Care | Payer: Medicare Other | Source: Ambulatory Visit | Attending: Family Medicine | Admitting: Family Medicine

## 2018-11-22 DIAGNOSIS — N858 Other specified noninflammatory disorders of uterus: Secondary | ICD-10-CM

## 2018-11-22 NOTE — Telephone Encounter (Signed)
Received call report from GI about pt with abnormal ultrasound.  Will forward to PCP. Christen Bame, CMA

## 2018-11-23 ENCOUNTER — Other Ambulatory Visit: Payer: Self-pay | Admitting: Family Medicine

## 2018-11-23 DIAGNOSIS — N9489 Other specified conditions associated with female genital organs and menstrual cycle: Secondary | ICD-10-CM

## 2018-11-23 NOTE — Telephone Encounter (Signed)
Called patient, no answer, unable to leave VM.  Will continue to attempt to call.

## 2018-11-23 NOTE — Progress Notes (Signed)
[place] concerning for endometrial carcinoma.  Attempted to call patient, no answer, unable to leave VM.  Will continue to attempt to call.  Will refer to gynecology for endometrial biopsy and further workup if indicated.

## 2018-11-24 DIAGNOSIS — I10 Essential (primary) hypertension: Secondary | ICD-10-CM | POA: Diagnosis not present

## 2018-11-24 DIAGNOSIS — Z79899 Other long term (current) drug therapy: Secondary | ICD-10-CM | POA: Diagnosis not present

## 2018-11-24 DIAGNOSIS — G8929 Other chronic pain: Secondary | ICD-10-CM | POA: Diagnosis not present

## 2018-11-24 DIAGNOSIS — M5442 Lumbago with sciatica, left side: Secondary | ICD-10-CM | POA: Diagnosis not present

## 2018-11-24 DIAGNOSIS — E119 Type 2 diabetes mellitus without complications: Secondary | ICD-10-CM | POA: Diagnosis not present

## 2018-12-01 ENCOUNTER — Other Ambulatory Visit: Payer: Self-pay | Admitting: Family Medicine

## 2018-12-01 DIAGNOSIS — M549 Dorsalgia, unspecified: Secondary | ICD-10-CM

## 2018-12-06 DIAGNOSIS — R0602 Shortness of breath: Secondary | ICD-10-CM | POA: Diagnosis not present

## 2018-12-06 DIAGNOSIS — E119 Type 2 diabetes mellitus without complications: Secondary | ICD-10-CM | POA: Diagnosis not present

## 2018-12-06 DIAGNOSIS — R072 Precordial pain: Secondary | ICD-10-CM | POA: Diagnosis not present

## 2018-12-06 DIAGNOSIS — I10 Essential (primary) hypertension: Secondary | ICD-10-CM | POA: Diagnosis not present

## 2018-12-09 ENCOUNTER — Other Ambulatory Visit: Payer: Self-pay | Admitting: Family Medicine

## 2018-12-09 DIAGNOSIS — J309 Allergic rhinitis, unspecified: Secondary | ICD-10-CM

## 2018-12-09 DIAGNOSIS — R0982 Postnasal drip: Secondary | ICD-10-CM

## 2018-12-11 ENCOUNTER — Telehealth: Payer: Self-pay | Admitting: Family Medicine

## 2018-12-11 ENCOUNTER — Other Ambulatory Visit: Payer: Self-pay | Admitting: Family Medicine

## 2018-12-11 ENCOUNTER — Other Ambulatory Visit: Payer: Self-pay

## 2018-12-11 ENCOUNTER — Ambulatory Visit (INDEPENDENT_AMBULATORY_CARE_PROVIDER_SITE_OTHER): Payer: Medicare Other | Admitting: Family Medicine

## 2018-12-11 VITALS — BP 128/74

## 2018-12-11 DIAGNOSIS — M48062 Spinal stenosis, lumbar region with neurogenic claudication: Secondary | ICD-10-CM | POA: Diagnosis not present

## 2018-12-11 DIAGNOSIS — R1031 Right lower quadrant pain: Secondary | ICD-10-CM

## 2018-12-11 MED ORDER — NAPROXEN 500 MG PO TABS: 500.0000 mg | ORAL_TABLET | Freq: Two times a day (BID) | ORAL | 0 refills | Status: DC

## 2018-12-11 NOTE — Assessment & Plan Note (Signed)
With current flare of chronic sciatica and without red flags on history or exam.  Provided naproxen for pain relief.  Encourage patient to reconsider physical therapy but as she previously did not get any relief with this as well as insurance issues, will not refer at this time.  Provided patient with low back exercises to strengthen supporting paravertebral musculature.  Given lack of recent injury or red flags, no need for reimaging or neurosurgical referral at this time.  Red flags and return precautions reviewed with the patient, see AVS for details.

## 2018-12-11 NOTE — Telephone Encounter (Signed)
Pt was seen today 12/11/18 for back pain. She is calling now to ask if there is any way she can have a prescription for a back brace. She said the pain in very bad and she would like to see if a brace would help.   Call pt with any questions. The best call back is [phone]

## 2018-12-11 NOTE — Patient Instructions (Signed)
It was great to see you!  Our plans for today:  -Take naproxen twice a day as needed for pain. - Do the back exercises provided to strengthen the muscles supporting your spine. - If you develop weakness of your legs or have inability to walk or lose control of your bowel or bladder, this is an emergency and you should go to the ED to be seen. - It is unlikely that neurosurgery would be able to surgically make your pain better unless it is a true emergency as stated above.  Take care and seek immediate care sooner if you develop any concerns.   Dr. [facility]

## 2018-12-11 NOTE — Telephone Encounter (Signed)
Please let patient know she should not need a prescription for this, she can get this from a medical supply store.  The best thing she can do for her back is the low back exercises supplied to her from our visit today. The back brace may or may not help but she can certainly try this to see if it helps her pain during the acute flares.

## 2018-12-11 NOTE — Progress Notes (Signed)
  Subjective:   Patient ID: Norma [name]    DOB: 07/04/1960, 58 y.o. female   MRN: [id number]  CATTIE ELLARD is a 58 y.o. female with a history of HTN, migraine, allergic rhinitis, GERD, diverticulosis, dyshidrotic eczema, plantar fascitis, uterine fibroids, obesity, chronic pain syndrome, prediabetes, depression, IDA here for   Sciatic pain History of bilateral sciatic pain in hips radiating down the back of both legs.  Had surgery prior to 2014 for spinal stenosis and nerve impingement.  Continues to have pain off and on, patient reports this flares for about 2 weeks every so often.  States last week her pain started flaring up again and became worse over the weekend. She denies any recent trauma.  Both her legs hurt but left is worse than the right.  States it hurts to bend over, with sitting or laying down.  Previously has tried naproxen which has been helpful for her.  She took a muscle relaxer and Tylenol this morning which helped.  She tried a heating pad which did not help.  She has previously seen physical therapy which she was only able to attend one session due to insurance issues but states this did not help.  She does attend pain clinic where she gets Percocet, she alternates this with Tylenol.  She denies any loss of bowel or bladder control.  Does have some numbness along both of her legs but this is not new.  Denies fevers, history of IV drug use, steroids.  Last lumbar x-ray 05/2014 with stable changes.  Review of Systems:  Per HPI.  [place], medications and smoking status reviewed.  Objective:   BP 128/74  Vitals and nursing note reviewed.  General: Overweight female, in no acute distress with non-toxic appearance CV: regular rate and rhythm without murmurs, rubs, or gallops, no lower extremity edema Lungs: clear to auscultation bilaterally with normal work of breathing Skin: warm, dry, no rashes or lesions Extremities: warm and well perfused, normal tone MSK: LE 5/5 strength,  gait normal. Limited AROM at hip with flexion, extension, and sidebending 2/2 to pain.  No point tenderness to palpation along paravertebral musculature.  No tenderness over spinal processes. Neuro: Alert and oriented, speech normal  Assessment & Plan:   Spinal stenosis, lumbar region, with neurogenic claudication With current flare of chronic sciatica and without red flags on history or exam.  Provided naproxen for pain relief.  Encourage patient to reconsider physical therapy but as she previously did not get any relief with this as well as insurance issues, will not refer at this time.  Provided patient with low back exercises to strengthen supporting paravertebral musculature.  Given lack of recent injury or red flags, no need for reimaging or neurosurgical referral at this time.  Red flags and return precautions reviewed with the patient, see AVS for details.  Meds ordered this encounter  Medications  . naproxen (NAPROSYN) 500 MG tablet    Sig: Take 1 tablet (500 mg total) by mouth 2 (two) times daily with a meal.    Dispense:  30 tablet    Refill:  0    Rory Percy, DO PGY-3, [facility] 12/11/2018 4:31 PM

## 2018-12-11 NOTE — Telephone Encounter (Signed)
Routing to doctor who saw pt.  April Zimmerman Rumple, CMA

## 2018-12-12 NOTE — Telephone Encounter (Signed)
LM for patient ok per DPR with message from MD regarding back brace.  Linnea Todisco,CMA

## 2018-12-14 ENCOUNTER — Telehealth (INDEPENDENT_AMBULATORY_CARE_PROVIDER_SITE_OTHER): Payer: Medicare Other | Admitting: Family Medicine

## 2018-12-14 ENCOUNTER — Other Ambulatory Visit: Payer: Self-pay

## 2018-12-14 DIAGNOSIS — J309 Allergic rhinitis, unspecified: Secondary | ICD-10-CM | POA: Diagnosis not present

## 2018-12-14 DIAGNOSIS — R0982 Postnasal drip: Secondary | ICD-10-CM | POA: Diagnosis not present

## 2018-12-14 DIAGNOSIS — K219 Gastro-esophageal reflux disease without esophagitis: Secondary | ICD-10-CM

## 2018-12-14 MED ORDER — HYDROXYZINE HCL 25 MG PO TABS: 25.0000 mg | ORAL_TABLET | Freq: Three times a day (TID) | ORAL | 12 refills | Status: DC | PRN

## 2018-12-14 MED ORDER — OMEPRAZOLE 20 MG PO CPDR: 40.0000 mg | DELAYED_RELEASE_CAPSULE | Freq: Every day | ORAL | 2 refills | Status: DC

## 2018-12-14 NOTE — Assessment & Plan Note (Signed)
Continue PPI at current dose.  Also raise head of bed.

## 2018-12-14 NOTE — Progress Notes (Signed)
Video visit.  Attempted x 2  Could not connect  REverted to phone call C/O night cough.  Cough in the morning.  Taking 40 mg omeprazole daily.  She seemed to get temporary improvement when her omeprazole was increased from 20 mg to 40 mg daily. She also has a history of allergies.  She describes her throat as scratchy and that she coughs up phelgm in the morning.  She has gotten partial relief from hydroxyzine.    No nasal congestion.  No fever, constitutional sx, wt loss or dysphagia.  Discussed that both GERD and Allergies possible.  Duration of phone call was 16 minutes.

## 2018-12-14 NOTE — Assessment & Plan Note (Signed)
Increase hydroxzine  (no sleepiness with 10 mg dose.)  Add OTC flonase if that does not work.

## 2018-12-19 ENCOUNTER — Ambulatory Visit (INDEPENDENT_AMBULATORY_CARE_PROVIDER_SITE_OTHER): Payer: Medicare Other | Admitting: Student in an Organized Health Care Education/Training Program

## 2018-12-19 ENCOUNTER — Other Ambulatory Visit: Payer: Self-pay

## 2018-12-19 DIAGNOSIS — N858 Other specified noninflammatory disorders of uterus: Secondary | ICD-10-CM

## 2018-12-19 NOTE — Assessment & Plan Note (Addendum)
Pelvic ultrasound suspicious for endometrial carcinoma and leiomyoma. Endometrial biopsy recommended at this time. Patient to make appointment at front desk at earliest convenience for biopsy. Attempting to get patient an appointment at our gyn clinic on Thursday, but if not- follow up on referral to [place].  Patient denies needing of any further pain management at this time.

## 2018-12-19 NOTE — Progress Notes (Signed)
   Subjective:    Patient ID: Norma [name], female    DOB: [birth year], 58 y.o.   MRN: [id number]   CC: Ultrasound results  HPI:  Patient comes to clinic as she would like to discuss the results from her recent pelvic ultrasound.  She continues to have a sharp pain in her left inguinal area with some radiation to the hip.  Denies any vaginal bleeding.  Denies pain with urination.  She states that movement exacerbates the pain.  Sometimes she is unable to complete her actions as she has to stop for the pain.  The pain will improve spontaneously.  The pain is also made better by taking the Percocet she takes for her back pain.  She understands that a biopsy is indicated for the findings on the ultrasound.  She states that she has had an endometrial biopsy in the past which was very painful but came back benign.  She will make an appointment for the gynecology clinic for biopsy at the earliest appointment.    Smoking status reviewed   ROS: pertinent noted in the HPI  Past medical history, surgical, family, and social history reviewed and updated in the EMR as appropriate.  Objective:  BP 118/74   Pulse 74   Wt 239 lb (108.4 kg)   SpO2 98%   BMI 36.34 kg/m   Vitals and nursing note reviewed  General: NAD, pleasant, able to participate in exam Extremities: no edema or cyanosis. Skin: warm and dry, no rashes noted Neuro: alert, no obvious focal deficits Psych: Normal affect and mood   Assessment & Plan:    Uterine mass Pelvic ultrasound suspicious for endometrial carcinoma and leiomyoma. Endometrial biopsy recommended at this time. Patient to make appointment at front desk at earliest convenience for biopsy. Attempting to get patient an appointment at our gyn clinic on Thursday, but if not- follow up on referral to [place] [place].  Patient denies needing of any further pain management at this time.     Doristine Mango, [facility] PGY-2

## 2018-12-19 NOTE — Patient Instructions (Signed)
It was a pleasure to see you today!  To summarize our discussion for this visit:  We discussed the ultrasound findings last month and the importance of following up with a biopsy. Please schedule that appointment at the front desk  Some additional health maintenance measures we should update are: Health Maintenance Due  Topic Date Due  . PNEUMOCOCCAL POLYSACCHARIDE VACCINE AGE 60-64 HIGH RISK  11/29/1960  . FOOT EXAM  11/29/1968  . OPHTHALMOLOGY EXAM  11/29/1968  . URINE MICROALBUMIN  11/29/1968  . HEMOGLOBIN A1C  08/17/2018  . INFLUENZA VACCINE  11/18/2018  .    Please return to our clinic to see me as soon as an appointment is available.  Call the clinic at [phone] if your symptoms worsen or you have any concerns.   Thank you for allowing me to take part in your care,  Dr. Doristine Mango

## 2018-12-21 ENCOUNTER — Ambulatory Visit (INDEPENDENT_AMBULATORY_CARE_PROVIDER_SITE_OTHER): Payer: Medicare Other | Admitting: Family Medicine

## 2018-12-21 ENCOUNTER — Other Ambulatory Visit: Payer: Self-pay

## 2018-12-21 VITALS — BP 132/80 | HR 89 | Temp 97.9°F | Wt 240.0 lb

## 2018-12-21 DIAGNOSIS — N858 Other specified noninflammatory disorders of uterus: Secondary | ICD-10-CM

## 2018-12-21 NOTE — Patient Instructions (Addendum)
We have referred you to GYN. [facility].  You can call them in the next few days [phone] to set up your apt.    Take care!

## 2018-12-21 NOTE — Assessment & Plan Note (Signed)
Long discussion today about findings of ultrasound.  Given the size of the uterine mass and its placement in the mid portion of the uterine canal, I do not think we can adequately sample her endometrium.  Will refer her to gynecology for further evaluation and management.

## 2018-12-21 NOTE — Progress Notes (Signed)
    CHIEF COMPLAINT / HPI: Uterine mass and pelvic pain In July she started having right lower quadrant pain.  Mostly on the right side.  Worse with bowel movement. Pelvic ultrasound done and she was told to come in for endometrial biopsy.  She has had no vaginal bleeding.  Menopause for her was in her 52s. REVIEW OF SYSTEMS: No unusual weight change, no fever, no vaginal bleeding, positive for right lower quadrant pain as per HPI.  PERTINENT  PMH / PSH: I have reviewed the patient's medications, allergies, past medical and surgical history, smoking status and updated in the EMR as appropriate.   OBJECTIVE: GENERAL: Well-developed female no acute distress PSYCH: AxOx4. Good eye contact.. No psychomotor retardation or agitation. Appropriate speech fluency and content. Asks and answers questions appropriately. Mood is congruent.  IMAGING: Measurements: 8.5 x 4.7 x 5.1 cm = volume: 113 mL. Small posterior wall subserosal leiomyoma 1.5 x 1.2 x 1.6 cm. No additional myometrial mass, see below  Endometrium  Thickness: No normal appearing endometrial complex is identified. Centrally within the uterus, a large rounded heterogeneous mass is identified measuring 2.5 x 2.3 x 3.3 cm in size highly concerning for endometrial neoplasm. Small amount of endometrial fluid is seen at the mid uterine segment below the mass.  Right ovary Not visualized on either transabdominal or endovaginal imaging, likely obscured by bowel Left ovary Not visualized on either transabdominal or endovaginal imaging, likely obscured by bowel Other findings A large shadowing collection is seen posterior to the uterus, favor rectal gas/stool. No free pelvic fluid. No definite adnexal masses. Neither ovary seen. IMPRESSION: Nonvisualization of ovaries. Heterogeneous central mass within the uterus 2.5 x 2.3 x 3.3 cm in size highly concerning for endometrial carcinoma; endometrial biopsy is recommended to  exclude endometrial carcinoma. Small posterior wall uterine leiomyoma 1.6 cm diameter.  Pap smear September 2019: NILM negative for high risk HPV.  No history of abnormal Paps ASSESSMENT / PLAN: #1.  Uterine mass seen on pelvic ultrasound. 2.  Right lower quadrant pain. PLAN: #1.  Long discussion today about findings of ultrasound.  Given the size of the uterine mass and its placement in the mid portion of the uterine canal, I do not think we can adequately sample her endometrium.  Will refer her to gynecology for further evaluation and management. 2.  Right lower quadrant pain could be related to this although fairly unlikely.  Greater than 50% of our 25 minute office visit was spent in counseling and education regarding findings on pelvic ultrasound, endometrial sampling etc.   No problem-specific Assessment & Plan notes found for this encounter.

## 2018-12-23 ENCOUNTER — Other Ambulatory Visit: Payer: Self-pay | Admitting: Family Medicine

## 2018-12-25 DIAGNOSIS — I10 Essential (primary) hypertension: Secondary | ICD-10-CM | POA: Diagnosis not present

## 2018-12-25 DIAGNOSIS — Z79899 Other long term (current) drug therapy: Secondary | ICD-10-CM | POA: Diagnosis not present

## 2018-12-25 DIAGNOSIS — M5442 Lumbago with sciatica, left side: Secondary | ICD-10-CM | POA: Diagnosis not present

## 2018-12-25 DIAGNOSIS — E119 Type 2 diabetes mellitus without complications: Secondary | ICD-10-CM | POA: Diagnosis not present

## 2018-12-25 DIAGNOSIS — G8929 Other chronic pain: Secondary | ICD-10-CM | POA: Diagnosis not present

## 2018-12-26 ENCOUNTER — Ambulatory Visit (INDEPENDENT_AMBULATORY_CARE_PROVIDER_SITE_OTHER): Payer: Medicare Other | Admitting: Family Medicine

## 2018-12-26 ENCOUNTER — Ambulatory Visit: Payer: Medicare Other

## 2018-12-26 ENCOUNTER — Other Ambulatory Visit: Payer: Self-pay

## 2018-12-26 DIAGNOSIS — G8929 Other chronic pain: Secondary | ICD-10-CM

## 2018-12-26 DIAGNOSIS — K219 Gastro-esophageal reflux disease without esophagitis: Secondary | ICD-10-CM

## 2018-12-26 DIAGNOSIS — R29818 Other symptoms and signs involving the nervous system: Secondary | ICD-10-CM

## 2018-12-26 DIAGNOSIS — M5442 Lumbago with sciatica, left side: Secondary | ICD-10-CM | POA: Diagnosis not present

## 2018-12-26 MED ORDER — GABAPENTIN 100 MG PO CAPS: 100.0000 mg | ORAL_CAPSULE | Freq: Every day | ORAL | 0 refills | Status: DC

## 2018-12-26 MED ORDER — OMEPRAZOLE 20 MG PO CPDR: 40.0000 mg | DELAYED_RELEASE_CAPSULE | Freq: Every day | ORAL | 2 refills | Status: DC

## 2018-12-26 NOTE — Patient Instructions (Signed)
It was a pleasure seeing you today.   Today we discussed your back pain  For your pain: I have ordered an MRI.  You can continue to do conservative measures such as Voltaren gel in the meantime.  Pending what the MRI shows we may refer you to a specialty doctor.  Please follow-up with your PCP in 1 month, sooner if worsening.  If you have any incontinence, worsening pain, fever please go to the emergency room.  Please follow up in 1 month or sooner if symptoms persist or worsen. Please call the clinic immediately if you have any concerns.   Our clinic's number is [phone]. Please call with questions or concerns.    Thank you,  Caroline More, DO

## 2018-12-26 NOTE — Assessment & Plan Note (Addendum)
Patient with worsening back pain now with sciatica and lower extremity weakness.  Given concern of weakness and sciatica there is concern for possible lumbar disc protrusion.  Patient has a history of lumbar surgery in the past.  Will obtain MRI without contrast to ensure there is no nerve impingement.  If worsening nerve impingement may consider referral to neurosurgery.  Patient should continue conservative measures at this time.  Will give trial of gabapentin 100 mg nightly for neuropathic pain from sciatica.  Strict return precautions given.  Follow-up in 1 month.

## 2018-12-26 NOTE — Progress Notes (Signed)
   Subjective:    Patient ID: Norma [name], female    DOB: 21-Jan-1961, 58 y.o.   MRN: [id number]   CC: back pain   HPI: Back pain Patient presenting with low back pain.  States that she had a lumbar surgery in 2016 in 3 to 4 months after surgery she began to start having back pain again.  4 weeks ago she started noticing this sharply increasing.  States that she is now having balance issues and more weakness.  Does report that the left side is more painful than the right.  Says it hurts to sit.  Pain radiates from her back all the way down to her left foot.  Has been taking Percocet but this does not help.  Has tried diclofenac gel which also does not help.  Does report numbness in her lower extremity.  Denies any fever.  Denies any incontinence.  Reports some lower extremity cramping.  Painful when sitting to standing.  Pain is worsened with any movement.  Nothing makes it better.  Patient with mild to moderate degenerative joint disease in both hips seen on 06/28/2017.      Objective:  BP (!) 158/82   Pulse (!) 114   SpO2 97%  Vitals and nursing note reviewed  General: well nourished, in no acute distress HEENT: normocephalic, no scleral icterus or conjunctival pallor Respiratory: no increased work of breathing Extremities: no edema or cyanosis. Warm, well perfused. 4/5 strength in LLE, 5/5 strength in RLE. Some limping gait. No pain with palpation of lumbar spine but patient reports pain with ROM testing. Full ROM in flexion and extension but with pain  Skin: warm and dry, no rashes noted Neuro: alert and oriented, sensation intact bilaterally    Assessment & Plan:    Back pain Patient with worsening back pain now with sciatica and lower extremity weakness.  Given concern of weakness and sciatica there is concern for possible lumbar disc protrusion.  Patient has a history of lumbar surgery in the past.  Will obtain MRI without contrast to ensure there is no nerve impingement.  If  worsening nerve impingement may consider referral to neurosurgery.  Patient should continue conservative measures at this time.  Will give trial of gabapentin 100 mg nightly for neuropathic pain from sciatica.  Strict return precautions given.  Follow-up in 1 month.    Return in about 4 weeks (around 01/23/2019).   Caroline More, DO, PGY-3

## 2018-12-28 ENCOUNTER — Telehealth: Payer: Self-pay

## 2018-12-28 NOTE — Telephone Encounter (Signed)
Pt had an appointment with Dr. Tammi Klippel on 9/8. Pt would like to know if she can go up on the dose of gabapentin. Please let pt know what she can do. Call pt on [phone]. Ottis Stain, CMA

## 2018-12-30 NOTE — Telephone Encounter (Signed)
Yes, please inform patient she can increase to 200mg  qhs  Caroline More, DO, PGY-3 [facility] 12/30/2018 3:04 PM

## 2019-01-01 ENCOUNTER — Telehealth: Payer: Self-pay

## 2019-01-01 ENCOUNTER — Ambulatory Visit: Payer: Medicare Other | Admitting: Family Medicine

## 2019-01-01 DIAGNOSIS — R0602 Shortness of breath: Secondary | ICD-10-CM | POA: Diagnosis not present

## 2019-01-01 DIAGNOSIS — E119 Type 2 diabetes mellitus without complications: Secondary | ICD-10-CM | POA: Diagnosis not present

## 2019-01-01 DIAGNOSIS — I10 Essential (primary) hypertension: Secondary | ICD-10-CM | POA: Diagnosis not present

## 2019-01-01 DIAGNOSIS — R072 Precordial pain: Secondary | ICD-10-CM | POA: Diagnosis not present

## 2019-01-01 NOTE — Telephone Encounter (Signed)
Called patient and informed her that she could increase Gabapentin to 200mg  qhs per Dr. Tammi Klippel.  Patient states that she does not take it at night instead she takes it during the day so she can walk.  It does not make her sleepy.  Norma [name], [place]

## 2019-01-02 ENCOUNTER — Encounter: Payer: Medicare Other | Admitting: Family Medicine

## 2019-01-05 ENCOUNTER — Ambulatory Visit: Payer: Medicare Other | Admitting: Family Medicine

## 2019-01-05 ENCOUNTER — Ambulatory Visit (HOSPITAL_COMMUNITY)
Admission: RE | Admit: 2019-01-05 | Discharge: 2019-01-05 | Disposition: A | Discharge status: Home / Self Care | Payer: Medicare Other | Source: Ambulatory Visit | Attending: Family Medicine | Admitting: Family Medicine

## 2019-01-05 ENCOUNTER — Other Ambulatory Visit: Payer: Self-pay

## 2019-01-05 DIAGNOSIS — R29818 Other symptoms and signs involving the nervous system: Secondary | ICD-10-CM | POA: Diagnosis not present

## 2019-01-05 DIAGNOSIS — M545 Low back pain: Secondary | ICD-10-CM | POA: Diagnosis not present

## 2019-01-08 ENCOUNTER — Ambulatory Visit: Payer: Medicare Other | Admitting: Family Medicine

## 2019-01-08 NOTE — Telephone Encounter (Signed)
Pt informed. Tristina Sahagian T Bless Lisenby, CMA  

## 2019-01-09 ENCOUNTER — Other Ambulatory Visit: Payer: Self-pay | Admitting: Family Medicine

## 2019-01-09 ENCOUNTER — Telehealth: Payer: Self-pay | Admitting: Family Medicine

## 2019-01-09 DIAGNOSIS — M47816 Spondylosis without myelopathy or radiculopathy, lumbar region: Secondary | ICD-10-CM

## 2019-01-09 NOTE — Telephone Encounter (Signed)
Contacted pt and gave her the number to the [facility] clinic on [address] so she could check the status of her referral.  Did inform her that the referral stated that it should be done in the next 2-3 weeks if possible. Norma [name], CMA

## 2019-01-09 NOTE — Telephone Encounter (Signed)
The patient still has not heard about scheduling appointment from her referral that was put in about a month ago for her uterine fibroids.  Please let her know the status of this, thanks.  [phone].

## 2019-01-11 ENCOUNTER — Ambulatory Visit: Payer: Medicare Other

## 2019-01-11 ENCOUNTER — Telehealth: Payer: Self-pay | Admitting: Obstetrics & Gynecology

## 2019-01-11 NOTE — Telephone Encounter (Signed)
Spoke to patient about her appointment on 9/25 @ 8:35. Patient instructed to wear a face mask for the entire appointment and no visitors are allowed with her during the visit. Patient screened for covid symptoms and denied having any

## 2019-01-12 ENCOUNTER — Other Ambulatory Visit (HOSPITAL_COMMUNITY)
Admission: RE | Admit: 2019-01-12 | Discharge: 2019-01-12 | Disposition: A | Discharge status: Home / Self Care | Payer: Medicare Other | Source: Ambulatory Visit | Attending: Obstetrics & Gynecology | Admitting: Obstetrics & Gynecology

## 2019-01-12 ENCOUNTER — Encounter: Payer: Self-pay | Admitting: Obstetrics & Gynecology

## 2019-01-12 ENCOUNTER — Other Ambulatory Visit: Payer: Self-pay

## 2019-01-12 ENCOUNTER — Ambulatory Visit (INDEPENDENT_AMBULATORY_CARE_PROVIDER_SITE_OTHER): Payer: Medicare Other | Admitting: Obstetrics & Gynecology

## 2019-01-12 VITALS — BP 149/93 | HR 105 | Temp 98.4°F | Ht 68.0 in | Wt 241.2 lb

## 2019-01-12 DIAGNOSIS — N9489 Other specified conditions associated with female genital organs and menstrual cycle: Secondary | ICD-10-CM

## 2019-01-12 DIAGNOSIS — N840 Polyp of corpus uteri: Secondary | ICD-10-CM | POA: Diagnosis not present

## 2019-01-12 DIAGNOSIS — Z780 Asymptomatic menopausal state: Secondary | ICD-10-CM | POA: Insufficient documentation

## 2019-01-12 NOTE — Progress Notes (Signed)
GYNECOLOGY OFFICE VISIT NOTE  History:   Norma [name] is a 58 y.o. [phone] here today for evaluation of uterine mass incidentally noted on recent ultrasound.  Has been menopausal since her 53s, no gynecologic symptoms.  Of note, this mass had been noted incidentally on previous CT scan done in 2018, there was no follow up.  She denies any abnormal vaginal discharge, bleeding, pelvic pain or other concerns.    Past Medical History:  Diagnosis Date  . Acid reflux   . Allergy   . Anemia   . Anxiety   . Arthritis   . Back pain   . Depression   . Diabetes mellitus without complication ([place])    on meds  . Headache    stress headaches, migraines at times  . Heart murmur   . Hypertension   . Irregular heart beat   . Left shoulder pain   . Shortness of breath dyspnea    with exertion  . Tachyarrhythmia   . Trigger point of thoracic region 03/22/2012    Past Surgical History:  Procedure Laterality Date  . ANKLE SURGERY    . KNEE SURGERY Left   . LUMBAR LAMINECTOMY/DECOMPRESSION MICRODISCECTOMY Left 06/06/2014   Procedure: LUMBAR LAMINECTOMY/DECOMPRESSION MICRODISCECTOMY 1 LEVEL;  Surgeon: Newman Pies, MD;  Location: [facility];  Service: Neurosurgery;  Laterality: Left;  Left L5S1 microdiskectomy  . ROTATOR CUFF REPAIR Left 02/20/14  . TUBAL LIGATION      The following portions of the patient's history were reviewed and updated as appropriate: allergies, current medications, past family history, past medical history, past social history, past surgical history and problem list.   Health Maintenance:  Normal pap and negative HRHPV on 12/22/2017.  Normal mammogram on 01/04/2018.   Review of Systems:  Pertinent items noted in HPI and remainder of comprehensive ROS otherwise negative.  Physical Exam:  BP (!) 149/93   Pulse (!) 105   Temp 98.4 F (36.9 C)   Ht 5\' 8"  (1.727 m)   Wt 241 lb 3.2 oz (109.4 kg)   BMI 36.67 kg/m  CONSTITUTIONAL: Well-developed, well-nourished  female in no acute distress.  HEENT:  Normocephalic, atraumatic. External right and left ear normal. No scleral icterus.  NECK: Normal range of motion, supple, no masses noted on observation SKIN: No rash noted. Not diaphoretic. No erythema. No pallor. MUSCULOSKELETAL: Normal range of motion. No edema noted. NEUROLOGIC: Alert and oriented to person, place, and time. Normal muscle tone coordination. No cranial nerve deficit noted. PSYCHIATRIC: Normal mood and affect. Normal behavior. Normal judgment and thought content. CARDIOVASCULAR: Normal heart rate noted RESPIRATORY: Effort and breath sounds normal, no problems with respiration noted ABDOMEN: No masses noted. No other overt distention noted.   PELVIC: Normal appearing external genitalia; normal appearing vaginal mucosa and cervix.  No abnormal discharge noted.  Normal uterine size, no other palpable masses, no uterine or adnexal tenderness.  ENDOMETRIAL BIOPSY     The indications for endometrial biopsy were reviewed.   Risks of the biopsy including cramping, bleeding, infection, uterine perforation, inadequate specimen and need for additional procedures  were discussed. The patient states she understands and agrees to undergo procedure today. Consent was signed. Time out was performed.   During the pelvic exam, the cervix was prepped with Betadine. A single-toothed tenaculum was placed on the anterior lip of the cervix to stabilize it. The 3 mm pipelle was introduced into the endometrial cavity without difficulty to a depth of 9 cm, and a moderate  amount of tissue was obtained and sent to pathology. The instruments were removed from the patient's vagina. Minimal bleeding from the cervix was noted. The patient tolerated the procedure well. Routine post-procedure instructions were given to the patient.     Labs and Imaging 11/22/2018  TRANSABDOMINAL AND TRANSVAGINAL ULTRASOUND OF PELVIS CLINICAL DATA:  Uterine mass on CT, RIGHT lower quadrant  pain  EXAM:  TECHNIQUE: Both transabdominal and transvaginal ultrasound examinations of the pelvis were performed. Transabdominal technique was performed for global imaging of the pelvis including uterus, ovaries, adnexal regions, and pelvic cul-de-sac. It was necessary to proceed with endovaginal exam following the transabdominal exam to visualize the uterus, endometrium, and ovaries. COMPARISON:  CT abdomen and pelvis 03/26/2017 FINDINGS: Uterus Measurements: 8.5 x 4.7 x 5.1 cm = volume: 113 mL. Small posterior wall subserosal leiomyoma 1.5 x 1.2 x 1.6 cm. No additional myometrial mass, see below Endometrium Thickness: No normal appearing endometrial complex is identified. Centrally within the uterus, a large rounded heterogeneous mass is identified measuring 2.5 x 2.3 x 3.3 cm in size highly concerning for endometrial neoplasm. Small amount of endometrial fluid is seen at the mid uterine segment below the mass. Right ovary Not visualized on either transabdominal or endovaginal imaging, likely obscured by bowel Left ovary Not visualized on either transabdominal or endovaginal imaging, likely obscured by bowel Other findings A large shadowing collection is seen posterior to the uterus, favor rectal gas/stool. No free pelvic fluid. No definite adnexal masses. Neither ovary seen. IMPRESSION: Nonvisualization of ovaries. Heterogeneous central mass within the uterus 2.5 x 2.3 x 3.3 cm in size highly concerning for endometrial carcinoma; endometrial biopsy is recommended to exclude endometrial carcinoma. Small posterior wall uterine leiomyoma 1.6 cm diameter. These results will be called to the ordering clinician or representative by the Radiologist Assistant, and communication documented in the PACS or zVision Dashboard. Electronically Signed   By: Lavonia Dana M.D.   On: 11/22/2018 16:23  12/82019 CT ABDOMEN AND PELVIS WITH CONTRAST CLINICAL DATA:  Abdominal pain for a few weeks, now radiating to the chest.  History of reflux disease, diabetes, fibroids, lumbar spine surgery. TECHNIQUE: Multidetector CT imaging of the abdomen and pelvis was performed using the standard protocol following bolus administration of intravenous contrast. CONTRAST:  62mL ISOVUE-300 IOPAMIDOL (ISOVUE-300) INJECTION 61% COMPARISON:  Acute abdominal series March 26, 2017 and CT abdomen and pelvis June 18, 2013. FINDINGS: LOWER CHEST: Lung bases are clear. Included heart size is normal. No pericardial effusion. HEPATOBILIARY: Liver and gallbladder are normal. PANCREAS: Normal. SPLEEN: Normal. ADRENALS/URINARY TRACT: Kidneys are orthotopic, demonstrating symmetric enhancement. No nephrolithiasis, hydronephrosis or solid renal masses. The unopacified ureters are normal in course and caliber. Delayed imaging through the kidneys demonstrates symmetric prompt contrast excretion within the proximal urinary collecting system. Urinary bladder is well distended and unremarkable. Normal adrenal glands. STOMACH/BOWEL: Dense reflux versus retained material in the esophagus The stomach, small and large bowel are normal in course and caliber without inflammatory changes. The appendix is not discretely identified, however there are no inflammatory changes in the right lower quadrant. VASCULAR/LYMPHATIC: Aortoiliac vessels are normal in course and caliber. Trace calcific atherosclerosis. No lymphadenopathy by CT size criteria. REPRODUCTIVE: At least 3.2 central uterine mass. 4.3 x 5.2 cm intermediate density lobulated LEFT adnexal mass stable from 2015. OTHER: No intraperitoneal free fluid or free air. Small fat containing umbilical hernia. MUSCULOSKELETAL: Nonacute. Severe L5-S1 degenerative disc and severe L5-S1 neural foraminal narrowing. IMPRESSION: 1. No acute intra-abdominal or pelvic process. 2. Dense  retained versus refluxed material in the distal esophagus. 3. 3.2 cm central uterine mass could reflect  polyp or, submucosal leiomyoma. Recommend pelvic sonogram on nonemergent basis. Aortic Atherosclerosis (ICD10-I70.0). Electronically Signed  By: Elon Alas M.D.  On: 03/26/2017 23:20     Assessment and Plan:      1. Endometrial mass 2. Postmenopausal - Surgical pathology; endometrial biopsy done today, will follow up results and manage accordingly. Lack of bleeding and stability of mass since 2018 are factors against this being neoplastic, but will follow up pathology.  Patient informed.    Routine preventative health maintenance measures emphasized. Please refer to After Visit Summary for other counseling recommendations.   Return for any gynecologic concerns.    Total face-to-face time with patient: 20 minutes.  Over 50% of encounter was spent on counseling and coordination of care.   Verita Schneiders, MD, [place] for [employer], [place]

## 2019-01-12 NOTE — Patient Instructions (Signed)

## 2019-01-14 ENCOUNTER — Other Ambulatory Visit: Payer: Self-pay | Admitting: Family Medicine

## 2019-01-14 DIAGNOSIS — J309 Allergic rhinitis, unspecified: Secondary | ICD-10-CM

## 2019-01-14 DIAGNOSIS — R0982 Postnasal drip: Secondary | ICD-10-CM

## 2019-01-16 LAB — SURGICAL PATHOLOGY

## 2019-01-18 ENCOUNTER — Telehealth: Payer: Self-pay | Admitting: General Practice

## 2019-01-18 NOTE — Telephone Encounter (Signed)
-----   Message from Osborne Oman, MD sent at 01/16/2019  3:02 PM EDT ----- 01/12/19 ENDOMETRIUM BIOPSY:  - Atrophic endometrium and benign endometrial polyp.  - No atypia or malignancy.  No further procedure needed unless she develops concerning symptoms.  Please call to inform patient of results and recommendations.

## 2019-01-18 NOTE — Telephone Encounter (Signed)
Called patient, no answer- unable to leave message as voicemail was full. Will send letter.

## 2019-01-23 ENCOUNTER — Ambulatory Visit (INDEPENDENT_AMBULATORY_CARE_PROVIDER_SITE_OTHER): Payer: Medicare Other | Admitting: Family Medicine

## 2019-01-23 ENCOUNTER — Other Ambulatory Visit: Payer: Self-pay

## 2019-01-23 ENCOUNTER — Encounter: Payer: Self-pay | Admitting: Family Medicine

## 2019-01-23 VITALS — BP 154/80 | HR 85

## 2019-01-23 DIAGNOSIS — Z23 Encounter for immunization: Secondary | ICD-10-CM | POA: Diagnosis not present

## 2019-01-23 DIAGNOSIS — H9193 Unspecified hearing loss, bilateral: Secondary | ICD-10-CM

## 2019-01-23 DIAGNOSIS — M25571 Pain in right ankle and joints of right foot: Secondary | ICD-10-CM

## 2019-01-23 DIAGNOSIS — Z712 Person consulting for explanation of examination or test findings: Secondary | ICD-10-CM | POA: Diagnosis not present

## 2019-01-23 DIAGNOSIS — G8929 Other chronic pain: Secondary | ICD-10-CM

## 2019-01-23 NOTE — Assessment & Plan Note (Signed)
EMB without evidence of cancer.  Read results and answered patient questions.

## 2019-01-23 NOTE — Assessment & Plan Note (Signed)
Hx ORIF many years ago.  XR in 2014.  Requesting referral to ortho, therefore placed.  Will hold off on imaging so ortho can order and can be available to them in office.

## 2019-01-23 NOTE — Assessment & Plan Note (Signed)
No obvious external cause.  Referred to ENT at patient request.

## 2019-01-23 NOTE — Patient Instructions (Signed)
Thank you for coming to see me today. It was a pleasure. Today we talked about:   Your hearing: I have placed a referral to ENT for your hearing.  If you do not hear from them in the next 2 weeks, please give [place] a call.  Your ankle: I have placed a referral to Orthopedics for your ankle.  If you do not hear from them in the next 2 weeks, please give [place] a call.  Make an appointment with an eye doctor.  You will get a flu shot today.  Please follow-up with me in 2-4 weeks for lab work follow up of your blood pressure and pre-diabetes.  If you have any questions or concerns, please do not hesitate to call the office at [phone].  Best,   [place] Constable, DO

## 2019-01-23 NOTE — Progress Notes (Signed)
Subjective: Chief Complaint  Patient presents with  . check ears  . check ankle     HPI: Norma [name] is a 58 y.o. presenting to clinic today to discuss the following:  1 Check Ears Doesn't think that she is hearing well x 1 year Felt like left ear "something was moving in it" Used some OTC drops and that sensation improved Occasional tinnitus No ear pain No history of hearing problems Requesting referral to ENT  2 Check Ankle Right ankle pain x few years S/p surgery with screws many years ago Pain is on the outside of her leg and feels like her screws are coming out Was told by another doctor a few years ago that she needed surgery again to have them fixed Last XRs in 2014 Requesting referral to Ortho  3 Discuss Results EMB performed and OB's office given concerning mass on [place].  Patient would like to know these results as she never heard from the [facility] office.  4 Requesting Letter Diagnosed with spondylosis and has been having back pain 2/2 to this Would like to have a letter for back diagnosis to take to pain clinic to show them what her back pain is caused by and see if medication changes need to be made  Health Maintenance: Reports eye exam last year, wants flu shot today     ROS noted in HPI. Chief complaint noted.  Other Pertinent PMH: HTN, Pre-DM, GERD, Lumbar herniated disc Past Medical, Surgical, Social, and Family History Reviewed & Updated per EMR.      Social History   Tobacco Use  Smoking Status Never Smoker  Smokeless Tobacco Never Used   Smoking status noted.    Objective: BP (!) 154/80   Pulse 85   SpO2 99%  Vitals and nursing notes reviewed  Physical Exam:  General: 58 y.o. female in NAD HEENT: TMs clear bilaterally with good light reflex, without large amounts of cerumen noted Lungs: Breathing comfortably on RA Skin: warm and dry MSK: right ankle with TTP lateral malleolus with small cystic structure palpated and questionable  screw palpated at lateral malleolus    No results found for this or any previous visit (from the past 72 hour(s)).  Assessment/Plan:  Bilateral hearing loss No obvious external cause.  Referred to ENT at patient request.  Chronic pain of right ankle Hx ORIF many years ago.  XR in 2014.  Requesting referral to ortho, therefore placed.  Will hold off on imaging so ortho can order and can be available to them in office.  Encounter to discuss test results EMB without evidence of cancer.  Read results and answered patient questions.     PATIENT EDUCATION PROVIDED: See AVS    Diagnosis and plan along with any newly prescribed medication(s) were discussed in detail with this patient today. The patient verbalized understanding and agreed with the plan. Patient advised if symptoms worsen return to clinic or ER.   Health Maintainance: advised to get eye exam, got flu shot today  Return in 1 month for pre-DM and HTN recheck.   Orders Placed This Encounter  Procedures  . Flu Vaccine QUAD 36+ mos IM  . Ambulatory referral to ENT    Referral Priority:   Routine    Referral Type:   Consultation    Referral Reason:   Specialty Services Required    Requested Specialty:   Otolaryngology    Number of Visits Requested:   1  . Ambulatory referral to  Orthopedic Surgery    Referral Priority:   Routine    Referral Type:   Surgical    Referral Reason:   Specialty Services Required    Requested Specialty:   Orthopedic Surgery    Number of Visits Requested:   1    No orders of the defined types were placed in this encounter.    [place] Constable, DO 01/23/2019, 6:48 PM PGY-2 [place]

## 2019-01-24 DIAGNOSIS — G8929 Other chronic pain: Secondary | ICD-10-CM | POA: Diagnosis not present

## 2019-01-24 DIAGNOSIS — I10 Essential (primary) hypertension: Secondary | ICD-10-CM | POA: Diagnosis not present

## 2019-01-24 DIAGNOSIS — E119 Type 2 diabetes mellitus without complications: Secondary | ICD-10-CM | POA: Diagnosis not present

## 2019-01-24 DIAGNOSIS — M5442 Lumbago with sciatica, left side: Secondary | ICD-10-CM | POA: Diagnosis not present

## 2019-01-30 ENCOUNTER — Ambulatory Visit: Payer: Medicare Other | Admitting: Family Medicine

## 2019-01-31 DIAGNOSIS — R05 Cough: Secondary | ICD-10-CM | POA: Diagnosis not present

## 2019-01-31 DIAGNOSIS — R072 Precordial pain: Secondary | ICD-10-CM | POA: Diagnosis not present

## 2019-01-31 DIAGNOSIS — E119 Type 2 diabetes mellitus without complications: Secondary | ICD-10-CM | POA: Diagnosis not present

## 2019-01-31 DIAGNOSIS — I10 Essential (primary) hypertension: Secondary | ICD-10-CM | POA: Diagnosis not present

## 2019-02-01 DIAGNOSIS — M25562 Pain in left knee: Secondary | ICD-10-CM | POA: Diagnosis not present

## 2019-02-01 DIAGNOSIS — S8992XA Unspecified injury of left lower leg, initial encounter: Secondary | ICD-10-CM | POA: Diagnosis not present

## 2019-02-01 DIAGNOSIS — M7052 Other bursitis of knee, left knee: Secondary | ICD-10-CM | POA: Diagnosis not present

## 2019-02-16 ENCOUNTER — Encounter: Payer: Self-pay | Admitting: *Deleted

## 2019-02-22 ENCOUNTER — Other Ambulatory Visit: Payer: Self-pay

## 2019-02-22 ENCOUNTER — Other Ambulatory Visit: Payer: Medicare Other

## 2019-02-22 ENCOUNTER — Ambulatory Visit (INDEPENDENT_AMBULATORY_CARE_PROVIDER_SITE_OTHER): Payer: Medicare Other | Admitting: Family Medicine

## 2019-02-22 VITALS — BP 118/60 | HR 103

## 2019-02-22 DIAGNOSIS — W19XXXA Unspecified fall, initial encounter: Secondary | ICD-10-CM | POA: Diagnosis not present

## 2019-02-22 DIAGNOSIS — M722 Plantar fascial fibromatosis: Secondary | ICD-10-CM

## 2019-02-22 DIAGNOSIS — M25562 Pain in left knee: Secondary | ICD-10-CM

## 2019-02-22 DIAGNOSIS — H9201 Otalgia, right ear: Secondary | ICD-10-CM | POA: Diagnosis not present

## 2019-02-22 DIAGNOSIS — M549 Dorsalgia, unspecified: Secondary | ICD-10-CM

## 2019-02-22 MED ORDER — FERROUS SULFATE 325 (65 FE) MG PO TABS: 325.0000 mg | ORAL_TABLET | Freq: Two times a day (BID) | ORAL | 3 refills | Status: DC

## 2019-02-22 MED ORDER — NAPROXEN 500 MG PO TABS: ORAL_TABLET | ORAL | 0 refills | Status: DC

## 2019-02-22 MED ORDER — DICLOFENAC SODIUM 1 % TD GEL: 2.0000 g | Freq: Two times a day (BID) | TRANSDERMAL | 1 refills | Status: DC | PRN

## 2019-02-22 NOTE — Patient Instructions (Addendum)
Thank you for coming to see me today. It was a pleasure! Today we talked about:   For your knee pain, continue using the diclofenac cream.  I have refilled today.  I would also try naproxen for any inflammation.  You can also alternate with some regular strength Tylenol with the Percocet that you usually take, the goal is to not go over 4000 mg/day of acetaminophen.  I also recommend continuing to ice it.  The bruising will continue to get better.  For your ear pain I recommend trying diclofenac cream over your jaw that is hurting.  It is important that you follow-up with ENT.  Referral has been placed and the phone number to call and schedule an appointment is [phone].  In the meantime, I also recommend getting a mouthguard for grinding her teeth at night.  This can be found at any regular store or pharmacy.  Please follow-up with your primary care provider as needed.  If you have any questions or concerns, please do not hesitate to call the office at [phone].  Take Care,   [place] Gaynelle Pastrana, DO

## 2019-02-22 NOTE — Progress Notes (Signed)
Subjective:  Patient ID: HEAVENLEE GUNNIN  DOB: [id number] MRN: [id number]  Norma [name] is a 58 y.o. female with a PMH of HTN, GERD, chronic pain, here today for L knee pain after fall.   HPI:  Left knee pain: -Patient reports that she fell 2 weeks ago after trying to get around her dog.  Patient reports that the pain clinic at [facility] had an x-ray with no fracture.  She states that they wrapped up at the time.  She has been using diclofenac cream as well as her regular Percocet for the pain.  States that originally she was icing it.  She is just worried because she has not below her knee and her bruising is started spreading.  He states that the pain is the same as when she fell on it.  She states that she has not had any more falls.   R ear pain: Previously complained of hearing loss which is improved, now ear pain present No history of hearing problems Requesting referral to ENT. Placed at last visit.   ROS: As mentioned in HPI  Smoking status reviewed  Patient Active Problem List   Diagnosis Date Noted  . Ear pain, right 02/26/2019  . Bilateral hearing loss 01/23/2019  . Uterine mass 12/19/2018  . Allergic rhinitis with postnasal drip 11/17/2018  . Diverticulosis of colon without hemorrhage 06/23/2018  . Arthritis of finger of right hand 06/23/2018  . Intractable migraine without aura and without status migrainosus 02/16/2018  . Breast asymmetry 11/30/2017  . Plantar fasciitis of right foot 07/06/2017  . Iron deficiency anemia 03/29/2017  . Vitamin D deficiency 03/01/2017  . Environmental allergies 11/03/2016  . Flank pain, chronic 08/27/2014  . Other cyst of bone, right ankle and foot 06/21/2014  . Lumbar herniated disc 06/06/2014  . Depression   . Prediabetes 01/14/2014  . Spinal stenosis, lumbar region, with neurogenic claudication 11/21/2013  . Back pain 06/06/2013  . Left knee pain 01/10/2013  . Decreased hearing 12/29/2011  . Hot flashes 06/30/2011  . Eczema,  dyshidrotic 06/02/2010  . Chronic pain of right ankle 05/06/2010  . FIBROIDS, UTERUS 10/24/2009  . GERD 06/09/2009  . Chronic pain syndrome 10/04/2008  . HIP PAIN, LEFT, CHRONIC 08/16/2007  . Hepatitis C carrier ([place]) 03/21/2007  . Obesity 06/16/2006  . HYPERTENSION, BENIGN SYSTEMIC 06/16/2006     Objective:  BP 118/60   Pulse (!) 103   SpO2 93%   Vitals and nursing note reviewed  General: NAD, pleasant Pulm: normal effort, CTAB Cardiac: RRR, normal heart sounds, no m/r/g Extremities: no edema or cyanosis. WWP. BLLE with 5/5 strength. Left leg with bruising anteriorly, tenderness over knee cap, normal ROM of L knee; BL knees without swelling/redness/heat Skin: warm and dry, no rashes noted Neuro: alert and oriented, no focal deficits Psych: normal affect, normal thought content  Assessment & Plan:   Left knee pain Mechanical fall where patient hit knee, neg Xray in pain clinic. On percocet chronically. Encouraged to use diclofenac cream. Try naproxen for any inflammation and alternate with some regular strength Tylenol and instructed not to go over 4000 mg/day of acetaminophen, and use temporarily for breakthrough pain.  Patient to continue to ice it and reassured.   Ear pain, right Exam wnl. No signs of infection. Could be related to jaw pain as patient does grind teeth. Encouraged to obtain mouth guard for ngihttime use and may use diclofenac cream over jaw for relief. ENT number given for patient to call and  schedule  [place] Aldine Grainger, DO Family Medicine Resident PGY-3

## 2019-02-23 ENCOUNTER — Other Ambulatory Visit: Payer: Medicare Other

## 2019-02-23 DIAGNOSIS — G8929 Other chronic pain: Secondary | ICD-10-CM | POA: Diagnosis not present

## 2019-02-23 DIAGNOSIS — Z9189 Other specified personal risk factors, not elsewhere classified: Secondary | ICD-10-CM | POA: Diagnosis not present

## 2019-02-23 DIAGNOSIS — M5442 Lumbago with sciatica, left side: Secondary | ICD-10-CM | POA: Diagnosis not present

## 2019-02-26 DIAGNOSIS — H9201 Otalgia, right ear: Secondary | ICD-10-CM | POA: Insufficient documentation

## 2019-02-26 NOTE — Assessment & Plan Note (Signed)
Mechanical fall where patient hit knee, neg Xray in pain clinic. On percocet chronically. Encouraged to use diclofenac cream. Try naproxen for any inflammation and alternate with some regular strength Tylenol and instructed not to go over 4000 mg/day of acetaminophen, and use temporarily for breakthrough pain.  Patient to continue to ice it and reassured.

## 2019-02-26 NOTE — Assessment & Plan Note (Signed)
Exam wnl. No signs of infection. Could be related to jaw pain as patient does grind teeth. Encouraged to obtain mouth guard for ngihttime use and may use diclofenac cream over jaw for relief. ENT number given for patient to call and schedule

## 2019-02-27 ENCOUNTER — Other Ambulatory Visit: Payer: Self-pay

## 2019-02-27 ENCOUNTER — Ambulatory Visit (INDEPENDENT_AMBULATORY_CARE_PROVIDER_SITE_OTHER): Payer: Medicare Other | Admitting: Family Medicine

## 2019-02-27 VITALS — BP 120/70 | HR 95 | Ht 68.0 in | Wt 242.1 lb

## 2019-02-27 DIAGNOSIS — M25552 Pain in left hip: Secondary | ICD-10-CM | POA: Insufficient documentation

## 2019-02-27 DIAGNOSIS — M7062 Trochanteric bursitis, left hip: Secondary | ICD-10-CM | POA: Diagnosis not present

## 2019-02-27 DIAGNOSIS — M79672 Pain in left foot: Secondary | ICD-10-CM

## 2019-02-27 MED ORDER — METHYLPREDNISOLONE ACETATE 80 MG/ML IJ SUSP
80.0000 mg | Freq: Once | INTRAMUSCULAR | Status: AC
  Administered 2019-02-27: 80 mg via INTRAMUSCULAR

## 2019-02-27 NOTE — Progress Notes (Deleted)
  Subjective:  Patient ID: Norma [name]  DOB: [id number] MRN: [id number]  Norma [name] is a 58 y.o. female with a PMH of ***, here today for ***.   HPI:  ***: - ***  ROS: As mentioned in HPI  Family hx: *** Social hx: Denies use of illicit drugs***, alcohol use*** Smoking status reviewed***  Patient Active Problem List   Diagnosis Date Noted  . Ear pain, right 02/26/2019  . Bilateral hearing loss 01/23/2019  . Uterine mass 12/19/2018  . Allergic rhinitis with postnasal drip 11/17/2018  . Diverticulosis of colon without hemorrhage 06/23/2018  . Arthritis of finger of right hand 06/23/2018  . Intractable migraine without aura and without status migrainosus 02/16/2018  . Breast asymmetry 11/30/2017  . Plantar fasciitis of right foot 07/06/2017  . Iron deficiency anemia 03/29/2017  . Vitamin D deficiency 03/01/2017  . Environmental allergies 11/03/2016  . Flank pain, chronic 08/27/2014  . Other cyst of bone, right ankle and foot 06/21/2014  . Lumbar herniated disc 06/06/2014  . Depression   . Prediabetes 01/14/2014  . Spinal stenosis, lumbar region, with neurogenic claudication 11/21/2013  . Back pain 06/06/2013  . Left knee pain 01/10/2013  . Decreased hearing 12/29/2011  . Hot flashes 06/30/2011  . Eczema, dyshidrotic 06/02/2010  . Chronic pain of right ankle 05/06/2010  . FIBROIDS, UTERUS 10/24/2009  . GERD 06/09/2009  . Chronic pain syndrome 10/04/2008  . HIP PAIN, LEFT, CHRONIC 08/16/2007  . Hepatitis C carrier ([place]) 03/21/2007  . Obesity 06/16/2006  . HYPERTENSION, BENIGN SYSTEMIC 06/16/2006     Objective:  There were no vitals taken for this visit.  Vitals and nursing note reviewed  General: NAD, pleasant Pulm: normal effort, CTAB Cardiac: RRR, normal heart sounds, no m/r/g ***GI: soft, nontender, nondistended Extremities: no edema or cyanosis. WWP. Skin: warm and dry, no rashes noted Neuro: alert and oriented, no focal deficits Psych: normal  affect, normal thought content  Assessment & Plan:   No problem-specific Assessment & Plan notes found for this encounter.   [place] Christerpher Clos, DO Family Medicine Resident PGY-3

## 2019-02-27 NOTE — Assessment & Plan Note (Addendum)
Most likely etiology of patient's hip pain is greater trochanteric bursitis. Pain while sleeping on left-side with tenderness to palpation of left hip support this diagnosis. Lumbar radiculopathy also considered but less likely given dull quality of pain and lack of radiation. -  Administered steroid injection (methylprednisone acetate, 80 mg) to left bursa. Tolerated well. Patient reported 50% improvement after injection -Patient instructed to discontinue the meloxicam while she is also taking naproxen -She is to follow-up if her symptoms return

## 2019-02-27 NOTE — Progress Notes (Addendum)
  Subjective  CC: Hip Pain  Norma [name] is a 58 y.o. female who presents today with the following problems:  Left Hip Pain:  Patient has a history of left-sided hip pain that has worsened over the past few months. Pain is dull in quality and worse with exertion. She reports pain when sleeping on her left side and hip tenderness to palpation. No trauma noted. Has tried meloxicam, naproxen, voltaren gel, and gabapentin without relief. Received percocet prescription from pain clinic which has provided moderate relief. Denies fever, weakness, loss of peripheral sensation, urinary incontinence, stool incontinence, weight loss.    Left Heel Pain:  Patient has experienced left-sided posterior heel pain for 2-3 months. She has a history heel numbness secondary to lumbar radiculopathy managed with medication and surgery. States that current pain feels separate from back pain. Worse when wearing shoes and with movement. Has applied voltaren gel without relief.   ROS: Pertinent ROS included in HPI. Objective  Physical Exam:  BP 120/70   Pulse 95   Ht 5\' 8"  (1.727 m)   Wt 242 lb 2 oz (109.8 kg)   SpO2 98%   BMI 36.81 kg/m  Gen: NAD, resting comfortably MSK: tenderness to palpation at left trochanteric bursa without erythema or swelling; tenderness on palpation of left achilles tendon, strength 5/5 in BL LE, normal gait Skin: warm, dry Neuro: grossly normal, moves all extremities Psych: Normal affect and thought content  Assessment & Plan    Greater trochanteric bursitis, left Most likely etiology of patient's hip pain is greater trochanteric bursitis. Pain while sleeping on left-side with tenderness to palpation of left hip support this diagnosis. Lumbar radiculopathy also considered but less likely given dull quality of pain and lack of radiation. -  Administered steroid injection (methylprednisone acetate, 80 mg) to left bursa. Tolerated well. Patient reported 50% improvement after  injection -Patient instructed to discontinue the meloxicam while she is also taking naproxen -She is to follow-up if her symptoms return  Pain of left heel Most likely etiology of heel pain is achilles tendinopathy. Tenderness to palpation of achilles tendon is consistent with this diagnosis. Heel spurs and plantar fascitis also considered but less likely given location of pain and lack of pain on foot dorsiflexion.  - Provided list of exercises and stretches to perform at home  -If pain persists may require referral to podiatry  Health Maintenance discussed with patient and patient agrees to address when able.   Norma [name], Medical Student  I have personally seen and examined this patient with Norma [name] and agree with the above note.  I have made any changes to the note above.  A steroid injection was performed at the left hip over the bursa using 1% plain Lidocaine and 80 mg of Depo-Medrol. This was well tolerated.  [place] Brad Lieurance, D.O. 02/27/2019, 5:47 PM PGY-3, [place]

## 2019-02-27 NOTE — Assessment & Plan Note (Addendum)
Most likely etiology of heel pain is achilles tendinopathy. Tenderness to palpation of achilles tendon is consistent with this diagnosis. Heel spurs and plantar fascitis also considered but less likely given location of pain and lack of pain on foot dorsiflexion.  - Provided list of exercises and stretches to perform at home  -If pain persists may require referral to podiatry

## 2019-02-27 NOTE — Patient Instructions (Addendum)
Thank you for coming to see me today. It was a pleasure! Today we talked about:   For your hip pain, I hope the injection helps!  For your left ankle pain. I have attached some stretches and think that icing will help. Please do not take meloxicam and naproxen together, only take naproxen.   Please follow-up with your primary care provider for follow up regarding your pre-diabetes.  If you have any questions or concerns, please do not hesitate to call the office at [phone].  Take Care,   [place] Linsy Ehresman, DO  Plantar Fasciitis Rehab Ask your health care provider which exercises are safe for you. Do exercises exactly as told by your health care provider and adjust them as directed. It is normal to feel mild stretching, pulling, tightness, or discomfort as you do these exercises. Stop right away if you feel sudden pain or your pain gets worse. Do not begin these exercises until told by your health care provider. Stretching and range-of-motion exercises These exercises warm up your muscles and joints and improve the movement and flexibility of your foot. These exercises also help to relieve pain. Plantar fascia stretch  1. Sit with your left / right leg crossed over your opposite knee. 2. Hold your heel with one hand with that thumb near your arch. With your other hand, hold your toes and gently pull them back toward the top of your foot. You should feel a stretch on the bottom of your toes or your foot (plantar fascia) or both. 3. Hold this stretch for__________ seconds. 4. Slowly release your toes and return to the starting position. Repeat __________ times. Complete this exercise __________ times a day. Gastrocnemius stretch, standing This exercise is also called a calf (gastroc) stretch. It stretches the muscles in the back of the upper calf. 1. Stand with your hands against a wall. 2. Extend your left / right leg behind you, and bend your front knee slightly. 3. Keeping your heels  on the floor and your back knee straight, shift your weight toward the wall. Do not arch your back. You should feel a gentle stretch in your upper left / right calf. 4. Hold this position for __________ seconds. Repeat __________ times. Complete this exercise __________ times a day. Soleus stretch, standing This exercise is also called a calf (soleus) stretch. It stretches the muscles in the back of the lower calf. 1. Stand with your hands against a wall. 2. Extend your left / right leg behind you, and bend your front knee slightly. 3. Keeping your heels on the floor, bend your back knee and shift your weight slightly over your back leg. You should feel a gentle stretch deep in your lower calf. 4. Hold this position for __________ seconds. Repeat __________ times. Complete this exercise __________ times a day. Gastroc and soleus stretch, standing step This exercise stretches the muscles in the back of the lower leg. These muscles are in the upper calf (gastrocnemius) and the lower calf (soleus). 1. Stand with the ball of your left / right foot on a step. The ball of your foot is on the walking surface, right under your toes. 2. Keep your other foot firmly on the same step. 3. Hold on to the wall or a railing for balance. 4. Slowly lift your other foot, allowing your body weight to press your left / right heel down over the edge of the step. You should feel a stretch in your left / right calf. 5. Hold  this position for __________ seconds. 6. Return both feet to the step. 7. Repeat this exercise with a slight bend in your left / right knee. Repeat __________ times with your left / right knee straight and __________ times with your left / right knee bent. Complete this exercise __________ times a day. Balance exercise This exercise builds your balance and strength control of your arch to help take pressure off your plantar fascia. Single leg stand If this exercise is too easy, you can try it with  your eyes closed or while standing on a pillow. 1. Without shoes, stand near a railing or in a doorway. You may hold on to the railing or door frame as needed. 2. Stand on your left / right foot. Keep your big toe down on the floor and try to keep your arch lifted. Do not let your foot roll inward. 3. Hold this position for __________ seconds. Repeat __________ times. Complete this exercise __________ times a day. This information is not intended to replace advice given to you by your health care provider. Make sure you discuss any questions you have with your health care provider. Document Released: 04/05/2005 Document Revised: 07/27/2018 Document Reviewed: 02/01/2018 Elsevier Patient Education  2020 [employer].

## 2019-03-12 ENCOUNTER — Telehealth (INDEPENDENT_AMBULATORY_CARE_PROVIDER_SITE_OTHER): Payer: Medicare Other | Admitting: Family Medicine

## 2019-03-12 ENCOUNTER — Other Ambulatory Visit: Payer: Self-pay

## 2019-03-12 DIAGNOSIS — M79604 Pain in right leg: Secondary | ICD-10-CM

## 2019-03-12 DIAGNOSIS — M79605 Pain in left leg: Secondary | ICD-10-CM | POA: Diagnosis not present

## 2019-03-12 NOTE — Progress Notes (Signed)
Virtual Visit via Video Note  I connected with Norma [name] on [id number] at  8:50 AM EST by a video enabled telemedicine application and verified that I am speaking with the correct person using two identifiers.  Video did fail during this visit and it was completed over the phone  Location: Patient: At home Provider: [facility] clinic   I discussed the limitations of evaluation and management by telemedicine and the availability of in person appointments. The patient expressed understanding and agreed to proceed.  History of Present Illness: Patient with a history of degenerative hip bilaterally that is confirmed by imaging calls complaining of continued and worsening pain throughout her upper thighs.  She describes this as a cramp and does not think it is necessarily from the joint.  She said she has tried drinking pickle juice to no resolve.   Observations/Objective: Patient is in no distress but as we are on video and then phone unable to do a physical hip exam.  Patient is speaking full sentences and seems to adequately understand the plan.  Assessment and Plan: As cramping could be caused by metabolite imbalance we ordered a BMP at her lab, as this could also be referred pain from a known degenerative hip issue a ambulatory referral to Ortho has been placed.  Follow Up Instructions:    I discussed the assessment and treatment plan with the patient. The patient was provided an opportunity to ask questions and all were answered. The patient agreed with the plan and demonstrated an understanding of the instructions.   The patient was advised to call back or seek an in-person evaluation if the symptoms worsen or if the condition fails to improve as anticipated.  I provided 11 minutes of non-face-to-face time during this encounter.   Sherene Sires, DO

## 2019-03-23 DIAGNOSIS — Z1159 Encounter for screening for other viral diseases: Secondary | ICD-10-CM | POA: Diagnosis not present

## 2019-03-23 DIAGNOSIS — E7800 Pure hypercholesterolemia, unspecified: Secondary | ICD-10-CM | POA: Diagnosis not present

## 2019-03-23 DIAGNOSIS — Z79899 Other long term (current) drug therapy: Secondary | ICD-10-CM | POA: Diagnosis not present

## 2019-03-23 DIAGNOSIS — G8929 Other chronic pain: Secondary | ICD-10-CM | POA: Diagnosis not present

## 2019-03-23 DIAGNOSIS — M5442 Lumbago with sciatica, left side: Secondary | ICD-10-CM | POA: Diagnosis not present

## 2019-03-23 DIAGNOSIS — E119 Type 2 diabetes mellitus without complications: Secondary | ICD-10-CM | POA: Diagnosis not present

## 2019-03-29 ENCOUNTER — Other Ambulatory Visit: Payer: Self-pay | Admitting: *Deleted

## 2019-03-29 ENCOUNTER — Other Ambulatory Visit: Payer: Medicare Other

## 2019-03-29 DIAGNOSIS — I10 Essential (primary) hypertension: Secondary | ICD-10-CM

## 2019-03-29 DIAGNOSIS — M25571 Pain in right ankle and joints of right foot: Secondary | ICD-10-CM | POA: Diagnosis not present

## 2019-04-04 ENCOUNTER — Other Ambulatory Visit: Payer: Self-pay

## 2019-04-04 ENCOUNTER — Telehealth (INDEPENDENT_AMBULATORY_CARE_PROVIDER_SITE_OTHER): Payer: Medicare Other | Admitting: Family Medicine

## 2019-04-04 ENCOUNTER — Telehealth (INDEPENDENT_AMBULATORY_CARE_PROVIDER_SITE_OTHER): Payer: Medicare Other | Admitting: Student in an Organized Health Care Education/Training Program

## 2019-04-04 DIAGNOSIS — J069 Acute upper respiratory infection, unspecified: Secondary | ICD-10-CM

## 2019-04-04 MED ORDER — NAPROXEN 500 MG PO TABS: ORAL_TABLET | ORAL | 0 refills | Status: DC

## 2019-04-04 MED ORDER — GUAIFENESIN-CODEINE 100-10 MG/5ML PO SOLN: 10.0000 mL | Freq: Three times a day (TID) | ORAL | 0 refills | Status: DC | PRN

## 2019-04-04 NOTE — Progress Notes (Signed)
Attempted to call patient twice during her appointment time slot without answer. The number rang without voicemail.

## 2019-04-04 NOTE — Progress Notes (Signed)
[place] Telemedicine Visit  Patient consented to have virtual visit. Method of visit: Video at first but switched over to telephone encounter after difficulty hearing   Encounter participants: Patient: Norma [name] - located at home Provider: [place] Sedonia Kitner - located at [facility] Others (if applicable): n/a  Chief Complaint: cough  HPI: Cough and headache.  She reports that Sunday night she started having a cough and some runny nose. She tried mucinex and robitussin for her dry cough. When she gets up to walk she coughs. She is not feeling short of breath. No known sick contacts or no known exposure to covid. No asthma or COPD history. Headache started Monday morning morning. The pain is shooting pain. Tylenol pm has helped with her pain. No facial pressure or tenderness. Pain is more behind her ears. No trouble hearing, no ringing in her ears.   ROS: per HPI  Pertinent PMHx: Diabetes, HTN, obesity  Exam:  General: well-appearing Respiratory: able to speak in complete sentences without issue  Assessment/Plan: Viral URI: Patient with symtpoms concerning for covid-19. Patient reports cough, congestion, headache, and body aches.   -Counseled on wearing a mask, washing hands and avoiding social gatherings  -ED precautions discussed and patient expressed good understanding - given robitussin-AC which has previously helped her with cough and instructed her to use honey as well for her cough.  -Patient instructed to avoid others until they meet criteria for ending isolation after any suspected COVID, which are:  -24 hours with no fever (without use of medicaitons) and -respiratory symptoms have improved (e.g. cough, shortness of breath) or  -10 days since symptoms first appeared  Time spent during visit with patient: 16 minutes  Norma Jaclynn Laumann, DO PGY-3, [place]

## 2019-04-05 ENCOUNTER — Other Ambulatory Visit: Payer: Self-pay

## 2019-04-05 ENCOUNTER — Telehealth: Payer: Medicare Other

## 2019-04-05 ENCOUNTER — Other Ambulatory Visit: Payer: Self-pay | Admitting: Family Medicine

## 2019-04-05 DIAGNOSIS — M722 Plantar fascial fibromatosis: Secondary | ICD-10-CM

## 2019-04-06 ENCOUNTER — Encounter (HOSPITAL_COMMUNITY): Payer: Self-pay | Admitting: Family Medicine

## 2019-04-06 ENCOUNTER — Other Ambulatory Visit: Payer: Self-pay

## 2019-04-06 ENCOUNTER — Ambulatory Visit (HOSPITAL_COMMUNITY)
Admission: EM | Admit: 2019-04-06 | Discharge: 2019-04-06 | Disposition: A | Discharge status: Home / Self Care | Payer: Medicare Other | Attending: Internal Medicine | Admitting: Internal Medicine

## 2019-04-06 DIAGNOSIS — E559 Vitamin D deficiency, unspecified: Secondary | ICD-10-CM | POA: Diagnosis not present

## 2019-04-06 DIAGNOSIS — Z8249 Family history of ischemic heart disease and other diseases of the circulatory system: Secondary | ICD-10-CM | POA: Diagnosis not present

## 2019-04-06 DIAGNOSIS — Z79899 Other long term (current) drug therapy: Secondary | ICD-10-CM | POA: Insufficient documentation

## 2019-04-06 DIAGNOSIS — F419 Anxiety disorder, unspecified: Secondary | ICD-10-CM | POA: Insufficient documentation

## 2019-04-06 DIAGNOSIS — E669 Obesity, unspecified: Secondary | ICD-10-CM | POA: Diagnosis not present

## 2019-04-06 DIAGNOSIS — K219 Gastro-esophageal reflux disease without esophagitis: Secondary | ICD-10-CM | POA: Diagnosis not present

## 2019-04-06 DIAGNOSIS — Z888 Allergy status to other drugs, medicaments and biological substances status: Secondary | ICD-10-CM | POA: Diagnosis not present

## 2019-04-06 DIAGNOSIS — J309 Allergic rhinitis, unspecified: Secondary | ICD-10-CM

## 2019-04-06 DIAGNOSIS — G894 Chronic pain syndrome: Secondary | ICD-10-CM | POA: Insufficient documentation

## 2019-04-06 DIAGNOSIS — Z7984 Long term (current) use of oral hypoglycemic drugs: Secondary | ICD-10-CM | POA: Diagnosis not present

## 2019-04-06 DIAGNOSIS — Z833 Family history of diabetes mellitus: Secondary | ICD-10-CM | POA: Diagnosis not present

## 2019-04-06 DIAGNOSIS — U071 COVID-19: Secondary | ICD-10-CM | POA: Insufficient documentation

## 2019-04-06 DIAGNOSIS — M48062 Spinal stenosis, lumbar region with neurogenic claudication: Secondary | ICD-10-CM | POA: Diagnosis not present

## 2019-04-06 DIAGNOSIS — R0982 Postnasal drip: Secondary | ICD-10-CM

## 2019-04-06 DIAGNOSIS — D509 Iron deficiency anemia, unspecified: Secondary | ICD-10-CM | POA: Diagnosis not present

## 2019-04-06 DIAGNOSIS — Z9851 Tubal ligation status: Secondary | ICD-10-CM | POA: Insufficient documentation

## 2019-04-06 DIAGNOSIS — F329 Major depressive disorder, single episode, unspecified: Secondary | ICD-10-CM | POA: Insufficient documentation

## 2019-04-06 DIAGNOSIS — J069 Acute upper respiratory infection, unspecified: Secondary | ICD-10-CM | POA: Diagnosis not present

## 2019-04-06 DIAGNOSIS — I10 Essential (primary) hypertension: Secondary | ICD-10-CM | POA: Diagnosis not present

## 2019-04-06 DIAGNOSIS — Z791 Long term (current) use of non-steroidal anti-inflammatories (NSAID): Secondary | ICD-10-CM | POA: Insufficient documentation

## 2019-04-06 DIAGNOSIS — R05 Cough: Secondary | ICD-10-CM | POA: Diagnosis not present

## 2019-04-06 NOTE — Discharge Instructions (Addendum)
We should have your Covid results and 3 to 4 days. Make sure you are taking precautions until then.  You can continue the cough medicine as needed and try warm salt water gargles for sore throat or Chloraseptic spray. Follow up as needed for continued or worsening symptoms

## 2019-04-06 NOTE — ED Triage Notes (Addendum)
Pt reports new onset headache with sharp pain behind both ears and a cough with a burning in her nostrils that started on Sunday.  Pt was given a Rx for cough med with codeine by her PCP on Monday.  Pt denies fever, ST, body aches, SOB, or loss of taste or smell.  Pt is here with her daughter who is having some cold symptoms as well.

## 2019-04-06 NOTE — ED Provider Notes (Signed)
[place]    CSN: [id number] Arrival date & time: 04/06/19  1052      History   Chief Complaint Chief Complaint  Patient presents with  . Headache  . Cough    HPI Norma [name] is a 58 y.o. female.   Patient is a 58 year old female presents today with generalized headache, ear discomfort, nasal congestion, cough.  Symptoms been constant.  She has been taking prescribed cough medicine with relief.  Denies any associated fever, sore throat, body aches, shortness of breath or loss of taste or smell.  Patient here with daughter having similar sick symptoms.  No known Covid exposures.  ROS per HPI      Past Medical History:  Diagnosis Date  . Acid reflux   . Allergy   . Anemia   . Anxiety   . Arthritis   . Back pain   . Depression   . Diabetes mellitus without complication ([place])    on meds  . Headache    stress headaches, migraines at times  . Heart murmur   . Hypertension   . Irregular heart beat   . Left shoulder pain   . Shortness of breath dyspnea    with exertion  . Tachyarrhythmia   . Trigger point of thoracic region 03/22/2012    Patient Active Problem List   Diagnosis Date Noted  . Greater trochanteric bursitis, left 02/27/2019  . Pain of left heel 02/27/2019  . Ear pain, right 02/26/2019  . Bilateral hearing loss 01/23/2019  . Uterine mass 12/19/2018  . Allergic rhinitis with postnasal drip 11/17/2018  . Diverticulosis of colon without hemorrhage 06/23/2018  . Arthritis of finger of right hand 06/23/2018  . Intractable migraine without aura and without status migrainosus 02/16/2018  . Breast asymmetry 11/30/2017  . Plantar fasciitis of right foot 07/06/2017  . Iron deficiency anemia 03/29/2017  . Vitamin D deficiency 03/01/2017  . Environmental allergies 11/03/2016  . Flank pain, chronic 08/27/2014  . Other cyst of bone, right ankle and foot 06/21/2014  . Lumbar herniated disc 06/06/2014  . Depression   . Prediabetes 01/14/2014    . Spinal stenosis, lumbar region, with neurogenic claudication 11/21/2013  . Back pain 06/06/2013  . Left knee pain 01/10/2013  . Decreased hearing 12/29/2011  . Hot flashes 06/30/2011  . Eczema, dyshidrotic 06/02/2010  . Chronic pain of right ankle 05/06/2010  . FIBROIDS, UTERUS 10/24/2009  . GERD 06/09/2009  . Chronic pain syndrome 10/04/2008  . HIP PAIN, LEFT, CHRONIC 08/16/2007  . Hepatitis C carrier ([place]) 03/21/2007  . Obesity 06/16/2006  . HYPERTENSION, BENIGN SYSTEMIC 06/16/2006    Past Surgical History:  Procedure Laterality Date  . ANKLE SURGERY    . KNEE SURGERY Left   . LUMBAR LAMINECTOMY/DECOMPRESSION MICRODISCECTOMY Left 06/06/2014   Procedure: LUMBAR LAMINECTOMY/DECOMPRESSION MICRODISCECTOMY 1 LEVEL;  Surgeon: Newman Pies, MD;  Location: [facility];  Service: Neurosurgery;  Laterality: Left;  Left L5S1 microdiskectomy  . ROTATOR CUFF REPAIR Left 02/20/14  . TUBAL LIGATION      OB History    Gravida  4   Para  4   Term  4   Preterm      AB  0   Living  4     SAB  0   TAB  0   Ectopic  0   Multiple  0   Live Births  4            Home Medications  Prior to Admission medications   Medication Sig Start Date End Date Taking? Authorizing Provider  buPROPion (WELLBUTRIN XL) 300 MG 24 hr tablet Take 300 mg by mouth daily.   Yes [provider]  Cholecalciferol (VITAMIN D) 2000 units CAPS Take 1 capsule (2,000 Units total) by mouth daily. 10/24/17  Yes Glenis Smoker, MD  diclofenac Sodium (VOLTAREN) 1 % GEL APPLY 2 G TOPICALLY 2 (TWO) TIMES DAILY AS NEEDED (HEEL PAIN). 04/05/19   Meccariello, Bernita Raisin, DO  gabapentin (NEURONTIN) 100 MG capsule Take 1 capsule (100 mg total) by mouth at bedtime. 12/26/18  Yes Abraham, Sherin, DO  guaiFENesin-codeine 100-10 MG/5ML syrup Take 10 mLs by mouth 3 (three) times daily as needed for cough. 04/04/19  Yes Enid Derry, [place], DO  hydrochlorothiazide (HYDRODIURIL) 25 MG tablet Take 1 tablet (25  mg total) by mouth daily. 11/02/16  Yes Everrett Coombe, MD  hydrOXYzine (ATARAX/VISTARIL) 25 MG tablet TAKE 1 TABLET BY MOUTH THREE TIMES A DAY AS NEEDED 01/15/19  Yes Meccariello, Bernita Raisin, DO  Magnesium Oxide -Mg Supplement 400 MG CAPS Take 1 capsule by mouth daily. 11/29/17  Yes [provider]  metFORMIN (GLUCOPHAGE) 500 MG tablet Take 500 mg by mouth daily. 11/21/17  Yes [provider]  metoprolol tartrate (LOPRESSOR) 25 MG tablet Take 1 tablet (25 mg total) by mouth 2 (two) times daily. 05/13/14  Yes [name], MD  multivitamin-iron-minerals-folic acid (CENTRUM) chewable tablet Chew 1 tablet daily by mouth. 03/01/17  Yes [name], [name], MD  omeprazole (PRILOSEC) 20 MG capsule Take 2 capsules (40 mg total) by mouth daily. 12/26/18  Yes Meccariello, Bernita Raisin, DO  oxyCODONE-acetaminophen (PERCOCET) 10-325 MG tablet Take 1 tablet by mouth 3 (three) times daily as needed. 12/22/17  Yes [provider]  potassium chloride (MICRO-K) 10 MEQ CR capsule Take 10 mEq by mouth daily. 11/29/17  Yes [provider]  traZODone (DESYREL) 100 MG tablet TAKE 2 TABLETS BY MOUTH ONCE AT BEDTIME 12/01/18  Yes Meccariello, Bernita Raisin, DO  Vitamin D, Ergocalciferol, (DRISDOL) 50000 units CAPS capsule TAKE 1 CAPSULE BY MOUTH EVERY 7 (SEVEN) DAYS. 12/09/17  Yes Meccariello, Bernita Raisin, DO  cyclobenzaprine (FLEXERIL) 10 MG tablet TAKE 1 TABLET BY MOUTH TWICE A DAY AS NEEDED FOR MUSCLE SPASMS 12/23/17   Meccariello, Bernita Raisin, DO  ferrous sulfate 325 (65 FE) MG tablet Take 1 tablet (325 mg total) by mouth 2 (two) times daily with a meal. 02/22/19   Enid Derry, [place], DO  fish oil-omega-3 fatty acids 1000 MG capsule Take 2 g by mouth daily.    [provider]  loratadine (CLARITIN) 10 MG tablet TAKE 1 TABLET BY MOUTH EVERY DAY 12/13/18   Martyn Malay, MD  naproxen (NAPROSYN) 500 MG tablet TAKE 1 TABLET BY MOUTH 2 TIMES DAILY WITH A MEAL. 04/04/19   Shirley, [place], DO  triamcinolone  ointment (KENALOG) 0.5 % Apply 1 application topically 2 (two) times daily. 11/22/17   Diallo, Earna Coder, MD  trolamine salicylate (ASPERCREME/ALOE) 10 % cream Apply 1 application topically as needed for muscle pain. 06/08/17   [name], MD  traZODone (DESYREL) 100 MG tablet TAKE 2 TABLETS BY MOUTH ONCE AT BEDTIME 09/01/18   Daisy Floro, DO    Family History Family History  Problem Relation Age of Onset  . Diabetes Brother   . Diabetes Maternal Aunt   . Diabetes Paternal Aunt   . Diabetes Maternal Grandmother   . Diabetes Maternal Grandfather   . Diabetes Paternal  Grandmother   . Diabetes Paternal Grandfather   . Congestive Heart Failure Mother   . Hypertension Son   . Colon cancer Neg Hx   . Esophageal cancer Neg Hx   . Rectal cancer Neg Hx   . Stomach cancer Neg Hx     Social History Social History   Tobacco Use  . Smoking status: Never Smoker  . Smokeless tobacco: Never Used  Substance Use Topics  . Alcohol use: Yes    Comment: glass of wine occasionally  . Drug use: No    Comment: States no longer uses marijuana     Allergies   Soma [carisoprodol], Nortriptyline, and Prednisone   Review of Systems Review of Systems   Physical Exam Triage Vital Signs ED Triage Vitals  Enc Vitals Group     BP 04/06/19 1115 (!) 154/101     Pulse Rate 04/06/19 1115 90     Resp 04/06/19 1115 12     Temp 04/06/19 1115 98.6 F (37 C)     Temp Source 04/06/19 1115 Oral     SpO2 04/06/19 1115 96 %     Weight --      Height --      Head Circumference --      Peak Flow --      Pain Score 04/06/19 1111 6     Pain Loc --      Pain Edu? --      Excl. in [place]? --    No data found.  Updated Vital Signs BP (!) 154/101 (BP Location: Right Arm)   Pulse 90   Temp 98.6 F (37 C) (Oral)   Resp 12   SpO2 96%   Visual Acuity Right Eye Distance:   Left Eye Distance:   Bilateral Distance:    Right Eye Near:   Left Eye Near:    Bilateral Near:     Physical  Exam Constitutional:      General: She is not in acute distress.    Appearance: She is well-developed. She is not ill-appearing, toxic-appearing or diaphoretic.  HENT:     Head: Normocephalic and atraumatic.     Right Ear: Tympanic membrane and ear canal normal.     Left Ear: Tympanic membrane and ear canal normal.     Mouth/Throat:     Mouth: Mucous membranes are moist.     Pharynx: Oropharynx is clear. Posterior oropharyngeal erythema present.  Eyes:     Conjunctiva/sclera: Conjunctivae normal.  Cardiovascular:     Rate and Rhythm: Normal rate and regular rhythm.     Pulses: Normal pulses.     Heart sounds: Normal heart sounds.  Pulmonary:     Effort: Pulmonary effort is normal.     Breath sounds: Normal breath sounds.  Musculoskeletal:        General: Normal range of motion.     Cervical back: Normal range of motion.  Skin:    General: Skin is warm and dry.  Neurological:     Mental Status: She is alert.  Psychiatric:        Mood and Affect: Mood normal.      UC Treatments / Results  Labs (all labs ordered are listed, but only abnormal results are displayed) Labs Reviewed  NOVEL CORONAVIRUS, NAA (HOSP ORDER, SEND-OUT TO REF LAB; TAT 18-24 HRS)    EKG   Radiology No results found.  Procedures Procedures (including critical care time)  Medications Ordered in UC Medications - No data to  display  Initial Impression / Assessment and Plan / UC Course  I have reviewed the triage vital signs and the nursing notes.  Pertinent labs & imaging results that were available during my care of the patient were reviewed by me and considered in my medical decision making (see chart for details).     Viral URI with cough-continue using the cough medication that was prescribed. Warm salt water gargles and Chloraseptic Spray for sore throat Covid test sent with labs pending.  Quarantine precautions given Final Clinical Impressions(s) / UC Diagnoses   Final diagnoses:    Viral URI with cough     Discharge Instructions     We should have your Covid results and 3 to 4 days. Make sure you are taking precautions until then.  You can continue the cough medicine as needed and try warm salt water gargles for sore throat or Chloraseptic spray. Follow up as needed for continued or worsening symptoms     ED Prescriptions    None     PDMP not reviewed this encounter.   Orvan July, NP 04/06/19 1206

## 2019-04-07 MED ORDER — HYDROXYZINE HCL 25 MG PO TABS: 25.0000 mg | ORAL_TABLET | Freq: Three times a day (TID) | ORAL | 0 refills | Status: DC | PRN

## 2019-04-09 ENCOUNTER — Telehealth: Payer: Medicare Other | Admitting: Family Medicine

## 2019-04-09 ENCOUNTER — Telehealth (HOSPITAL_COMMUNITY): Payer: Self-pay | Admitting: Emergency Medicine

## 2019-04-09 LAB — NOVEL CORONAVIRUS, NAA (HOSP ORDER, SEND-OUT TO REF LAB; TAT 18-24 HRS): SARS-CoV-2, NAA: DETECTED — AB

## 2019-04-09 NOTE — Telephone Encounter (Signed)

## 2019-04-10 ENCOUNTER — Telehealth: Payer: Self-pay | Admitting: Unknown Physician Specialty

## 2019-04-10 ENCOUNTER — Other Ambulatory Visit: Payer: Self-pay

## 2019-04-10 ENCOUNTER — Telehealth (INDEPENDENT_AMBULATORY_CARE_PROVIDER_SITE_OTHER): Payer: Medicare Other | Admitting: Family Medicine

## 2019-04-10 DIAGNOSIS — U071 COVID-19: Secondary | ICD-10-CM | POA: Diagnosis not present

## 2019-04-10 DIAGNOSIS — J040 Acute laryngitis: Secondary | ICD-10-CM | POA: Diagnosis not present

## 2019-04-10 MED ORDER — PHENOL 1.4 % MT LIQD: 1.0000 | OROMUCOSAL | 1 refills | Status: DC | PRN

## 2019-04-10 MED ORDER — FLUTICASONE PROPIONATE 50 MCG/ACT NA SUSP: 2.0000 | Freq: Every day | NASAL | 6 refills | Status: DC

## 2019-04-10 MED ORDER — CETIRIZINE HCL 10 MG PO TABS: 10.0000 mg | ORAL_TABLET | Freq: Every day | ORAL | 11 refills | Status: DC

## 2019-04-10 NOTE — Telephone Encounter (Signed)
Called to discuss with patient about Covid symptoms and the use of bamlanivimab, a monoclonal antibody infusion for those with mild to moderate Covid symptoms and at a high risk of hospitalization.  Pt is qualified for this infusion at the [facility] infusion center due to Age >55, Diabetes and Hypertension   Message left to call back

## 2019-04-10 NOTE — Telephone Encounter (Signed)
Pt called back about monoclonal ab infusion for Covid 19.  She has already been ill for 9 days and will be ill for longer than 10 days at the time of infusion.  Unfortunately, she no longer qualifies. Discussed quarantining until free of symptoms.

## 2019-04-10 NOTE — Assessment & Plan Note (Signed)
Patient having acute pharyngitis in the setting of recent COVID-19 infection.  If continues to have stuffy nose and drainage as well as sore throat. -Recommend Flonase to each nostril daily -Cetirizine by mouth 10mg  daily -Ibuprofen and/or Tylenol as needed for pain, sore throat, fevers -Patient encouraged to stay hydrated and to avoid whispering as this can worsen laryngitis

## 2019-04-10 NOTE — Progress Notes (Signed)
[place] Telemedicine Visit  Patient consented to have virtual visit. Method of visit: Video  Encounter participants: Patient: Norma [name] - located at home [phone] Provider: Daisy Floro - located at [facility] Others (if applicable): Daughter  Chief Complaint: Sore throat, no voice  HPI: On 04/06/2019 patient was found to be COVID+. Over the weekend per her daughter she's been fine however she's continued to have a dry cough, headaches, and laryngitis. She is having shortness of breath with laying flat and therefore has been propping herself up on pillows since last week, this is new since COVID started. Not having SOB with ambulating or dyspnea at rest otherwise. Never had to use CPAP before, no history of Sleep Apnea or heart failure. Is having stuffy nose and runny nose. No face pain. No concern or issues with swallowing. No lower leg swelling. She is aggravated that her voice is gone.  To help her symptoms she has been taking robitussin with Codeine, Benzocaine topical for cough/throat, has been drinking hot tea with honey/lemon. Was taking Mucinex but this is not helping her throat.   ROS: per HPI  Pertinent PMHx: COVID +, HTN, Obesity, no history of CHF  Exam:  Respiratory: Comfortable work of breathing,  Assessment/Plan: Acute laryngitis Patient having acute pharyngitis in the setting of recent COVID-19 infection.  If continues to have stuffy nose and drainage as well as sore throat. -Recommend Flonase to each nostril daily -Cetirizine by mouth 10mg  daily -Ibuprofen and/or Tylenol as needed for pain, sore throat, fevers -Patient encouraged to stay hydrated and to avoid whispering as this can worsen laryngitis -Patient can continue hydration with tea, taking lemon and honey for symptoms   Time spent during visit with patient: 15 minutes  Milus Banister, [place], PGY-2 04/10/2019 2:51 PM

## 2019-04-10 NOTE — Telephone Encounter (Signed)
Pt called back with her daughter siscuss with patient about Covid symptoms and the use of bamlanivimab, a monoclonal antibody infusion for those with mild to moderate Covid symptoms and at a high risk of hospitalization.  Pt is qualified for this infusion at the [facility] infusion center due to Age >55, Diabetes and Hypertension   Benefits and risks of the infusion discussed.  Will think about it and call me back

## 2019-04-11 ENCOUNTER — Other Ambulatory Visit: Payer: Self-pay | Admitting: *Deleted

## 2019-04-11 DIAGNOSIS — J309 Allergic rhinitis, unspecified: Secondary | ICD-10-CM

## 2019-04-11 MED ORDER — LORATADINE 10 MG PO TABS: 10.0000 mg | ORAL_TABLET | Freq: Every day | ORAL | 3 refills | Status: DC

## 2019-04-14 ENCOUNTER — Ambulatory Visit (HOSPITAL_COMMUNITY)
Admission: EM | Admit: 2019-04-14 | Discharge: 2019-04-14 | Disposition: A | Discharge status: Home / Self Care | Payer: Medicare Other | Attending: Urgent Care | Admitting: Urgent Care

## 2019-04-14 ENCOUNTER — Other Ambulatory Visit: Payer: Self-pay

## 2019-04-14 ENCOUNTER — Encounter (HOSPITAL_COMMUNITY): Payer: Self-pay

## 2019-04-14 DIAGNOSIS — U071 COVID-19: Secondary | ICD-10-CM | POA: Diagnosis not present

## 2019-04-14 DIAGNOSIS — R059 Cough, unspecified: Secondary | ICD-10-CM

## 2019-04-14 DIAGNOSIS — R05 Cough: Secondary | ICD-10-CM | POA: Diagnosis not present

## 2019-04-14 MED ORDER — BENZONATATE 100 MG PO CAPS: 100.0000 mg | ORAL_CAPSULE | Freq: Three times a day (TID) | ORAL | 0 refills | Status: DC | PRN

## 2019-04-14 MED ORDER — PROMETHAZINE-DM 6.25-15 MG/5ML PO SYRP: 5.0000 mL | ORAL_SOLUTION | Freq: Every evening | ORAL | 0 refills | Status: DC | PRN

## 2019-04-14 MED ORDER — HYDROCODONE-HOMATROPINE 5-1.5 MG/5ML PO SYRP: 5.0000 mL | ORAL_SOLUTION | Freq: Every evening | ORAL | 0 refills | Status: DC | PRN

## 2019-04-14 NOTE — ED Triage Notes (Signed)
Pt reports having cough x 1 week. Pt tested positive for COVID 5 days ago.

## 2019-04-14 NOTE — Discharge Instructions (Signed)

## 2019-04-14 NOTE — ED Provider Notes (Signed)
[place]   MRN: [id number] DOB: Jan 04, 1961  Subjective:   Norma [name] is a 58 y.o. female presenting for 1 week history of persistent productive cough, sweats.  Patient tested positive for COVID-19 on 04/06/2019.  Patient has been using TheraFlu, Mucinex with minimal relief.  States that her cough is really bothering her sleep.  Denies smoking history, history of asthma.  No current facility-administered medications for this encounter.  Current Outpatient Medications:  .  buPROPion (WELLBUTRIN XL) 300 MG 24 hr tablet, Take 300 mg by mouth daily., Disp: , Rfl:  .  cetirizine (ZYRTEC) 10 MG tablet, Take 1 tablet (10 mg total) by mouth daily., Disp: 30 tablet, Rfl: 11 .  Cholecalciferol (VITAMIN D) 2000 units CAPS, Take 1 capsule (2,000 Units total) by mouth daily., Disp: 90 capsule, Rfl: 2 .  cyclobenzaprine (FLEXERIL) 10 MG tablet, TAKE 1 TABLET BY MOUTH TWICE A DAY AS NEEDED FOR MUSCLE SPASMS, Disp: 60 tablet, Rfl: 3 .  diclofenac Sodium (VOLTAREN) 1 % GEL, APPLY 2 G TOPICALLY 2 (TWO) TIMES DAILY AS NEEDED (HEEL PAIN)., Disp: 100 g, Rfl: 1 .  gabapentin (NEURONTIN) 100 MG capsule, Take 1 capsule (100 mg total) by mouth at bedtime., Disp: 90 capsule, Rfl: 0 .  guaiFENesin-codeine 100-10 MG/5ML syrup, Take 10 mLs by mouth 3 (three) times daily as needed for cough., Disp: 180 mL, Rfl: 0 .  hydrochlorothiazide (HYDRODIURIL) 25 MG tablet, Take 1 tablet (25 mg total) by mouth daily., Disp: 90 tablet, Rfl: 3 .  hydrOXYzine (ATARAX/VISTARIL) 25 MG tablet, Take 1 tablet (25 mg total) by mouth 3 (three) times daily as needed., Disp: 270 tablet, Rfl: 0 .  loratadine (CLARITIN) 10 MG tablet, Take 1 tablet (10 mg total) by mouth daily., Disp: 30 tablet, Rfl: 3 .  metFORMIN (GLUCOPHAGE) 500 MG tablet, Take 500 mg by mouth daily., Disp: , Rfl: 2 .  metoprolol tartrate (LOPRESSOR) 25 MG tablet, Take 1 tablet (25 mg total) by mouth 2 (two) times daily., Disp: 180 tablet, Rfl: 3 .   multivitamin-iron-minerals-folic acid (CENTRUM) chewable tablet, Chew 1 tablet daily by mouth., Disp: 90 tablet, Rfl: 3 .  naproxen (NAPROSYN) 500 MG tablet, TAKE 1 TABLET BY MOUTH 2 TIMES DAILY WITH A MEAL., Disp: 30 tablet, Rfl: 0 .  omeprazole (PRILOSEC) 20 MG capsule, Take 2 capsules (40 mg total) by mouth daily., Disp: 90 capsule, Rfl: 2 .  traZODone (DESYREL) 100 MG tablet, TAKE 2 TABLETS BY MOUTH ONCE AT BEDTIME, Disp: 180 tablet, Rfl: 0 .  triamcinolone ointment (KENALOG) 0.5 %, Apply 1 application topically 2 (two) times daily., Disp: 30 g, Rfl: 0 .  Vitamin D, Ergocalciferol, (DRISDOL) 50000 units CAPS capsule, TAKE 1 CAPSULE BY MOUTH EVERY 7 (SEVEN) DAYS., Disp: 8 capsule, Rfl: 3   Allergies  Allergen Reactions  . Soma [Carisoprodol] Itching    Itching/rash around mouth, itching back of throat.   . Nortriptyline Rash  . Prednisone Rash    REACTION: rash and itching.     Past Medical History:  Diagnosis Date  . Acid reflux   . Allergy   . Anemia   . Anxiety   . Arthritis   . Back pain   . Depression   . Diabetes mellitus without complication ([place])    on meds  . Headache    stress headaches, migraines at times  . Heart murmur   . Hypertension   . Irregular heart beat   . Left shoulder pain   . Shortness of  breath dyspnea    with exertion  . Tachyarrhythmia   . Trigger point of thoracic region 03/22/2012     Past Surgical History:  Procedure Laterality Date  . ANKLE SURGERY    . KNEE SURGERY Left   . LUMBAR LAMINECTOMY/DECOMPRESSION MICRODISCECTOMY Left 06/06/2014   Procedure: LUMBAR LAMINECTOMY/DECOMPRESSION MICRODISCECTOMY 1 LEVEL;  Surgeon: Newman Pies, MD;  Location: [facility];  Service: Neurosurgery;  Laterality: Left;  Left L5S1 microdiskectomy  . ROTATOR CUFF REPAIR Left 02/20/14  . TUBAL LIGATION      Family History  Problem Relation Age of Onset  . Diabetes Brother   . Diabetes Maternal Aunt   . Diabetes Paternal Aunt   . Diabetes Maternal  Grandmother   . Diabetes Maternal Grandfather   . Diabetes Paternal Grandmother   . Diabetes Paternal Grandfather   . Congestive Heart Failure Mother   . Hypertension Son   . Colon cancer Neg Hx   . Esophageal cancer Neg Hx   . Rectal cancer Neg Hx   . Stomach cancer Neg Hx     Social History   Tobacco Use  . Smoking status: Never Smoker  . Smokeless tobacco: Never Used  Substance Use Topics  . Alcohol use: Yes    Comment: glass of wine occasionally  . Drug use: No    Comment: States no longer uses marijuana    ROS Denies fever, chest pain, shortness of breath, wheezing, nausea, vomiting, belly pain.  Objective:   Vitals: BP (!) 132/92 (BP Location: Left Arm)   Pulse 93   Temp 99 F (37.2 C) (Oral)   Resp 16   SpO2 98%   Wt Readings from Last 3 Encounters:  02/27/19 242 lb 2 oz (109.8 kg)  01/12/19 241 lb 3.2 oz (109.4 kg)  12/21/18 240 lb (108.9 kg)   Temp Readings from Last 3 Encounters:  04/14/19 99 F (37.2 C) (Oral)  04/06/19 98.6 F (37 C) (Oral)  01/12/19 98.4 F (36.9 C)   BP Readings from Last 3 Encounters:  04/14/19 (!) 132/92  04/06/19 (!) 154/101  02/27/19 120/70   Pulse Readings from Last 3 Encounters:  04/14/19 93  04/06/19 90  02/27/19 95    Physical Exam Constitutional:      General: She is not in acute distress.    Appearance: Normal appearance. She is well-developed. She is obese. She is not ill-appearing, toxic-appearing or diaphoretic.  HENT:     Head: Normocephalic and atraumatic.     Nose: Nose normal.     Mouth/Throat:     Mouth: Mucous membranes are moist.  Eyes:     Extraocular Movements: Extraocular movements intact.     Pupils: Pupils are equal, round, and reactive to light.  Cardiovascular:     Rate and Rhythm: Normal rate and regular rhythm.     Pulses: Normal pulses.     Heart sounds: Normal heart sounds. No murmur. No friction rub. No gallop.   Pulmonary:     Effort: Pulmonary effort is normal. No respiratory  distress.     Breath sounds: Normal breath sounds. No stridor. No wheezing, rhonchi or rales.  Skin:    General: Skin is warm and dry.     Findings: No rash.  Neurological:     Mental Status: She is alert and oriented to person, place, and time.  Psychiatric:        Mood and Affect: Mood normal.        Behavior: Behavior normal.  Thought Content: Thought content normal.     Assessment and Plan :   1. COVID-19   2. Cough     Patient has reassuring vital signs and physical exam findings.  We will not use chest x-ray today.  Will use strong cough suppression medications including Tessalon, promethazine cough syrup and just in case this does not help hydrocodone homatropine cough syrup to be used at bedtime only not controlled with observation medications. Counseled patient on potential for adverse effects with medications prescribed/recommended today, ER and return-to-clinic precautions discussed, patient verbalized understanding.    Jaynee Eagles, PA-C 04/14/19 1446

## 2019-04-15 ENCOUNTER — Other Ambulatory Visit: Payer: Self-pay | Admitting: Family Medicine

## 2019-04-23 DIAGNOSIS — G8929 Other chronic pain: Secondary | ICD-10-CM | POA: Diagnosis not present

## 2019-04-23 DIAGNOSIS — M5442 Lumbago with sciatica, left side: Secondary | ICD-10-CM | POA: Diagnosis not present

## 2019-04-23 DIAGNOSIS — U071 COVID-19: Secondary | ICD-10-CM | POA: Diagnosis not present

## 2019-04-23 DIAGNOSIS — Z9189 Other specified personal risk factors, not elsewhere classified: Secondary | ICD-10-CM | POA: Diagnosis not present

## 2019-04-24 ENCOUNTER — Encounter: Payer: Self-pay | Admitting: Family Medicine

## 2019-04-24 ENCOUNTER — Telehealth (INDEPENDENT_AMBULATORY_CARE_PROVIDER_SITE_OTHER): Payer: Medicare Other | Admitting: Family Medicine

## 2019-04-24 DIAGNOSIS — Z20822 Contact with and (suspected) exposure to covid-19: Secondary | ICD-10-CM

## 2019-04-24 DIAGNOSIS — R058 Other specified cough: Secondary | ICD-10-CM

## 2019-04-24 DIAGNOSIS — R05 Cough: Secondary | ICD-10-CM | POA: Diagnosis not present

## 2019-04-24 NOTE — Progress Notes (Signed)
[place] Telemedicine Visit  Patient consented to have virtual visit. Method of visit: Video  Encounter participants: Patient: Norma [name] - located at patient's home  Provider: Stark Klein - located at provider's home  Others (if applicable): none  Chief Complaint: cough  HPI: Ms. Motz reports having a persistent cough despite trying OTC medications. She reports that she has tried robitussin with honey and  Alka-seltzer with no substantial improvement.  She denies having any breathing problems and states that her chest does not hurt or feel tight.  She describes the cough as productive.  She endorses some head congestion and has been using nasal spray, Flonase.   No fever but has been waking up sweaty.  Patient states she has been taking mucinex every 12 hours and cough syrup with codeine that did not quiet the cough for her.   Patient also reports night time cough.   Received call from health dept for that she was cleared to leave her home after quarantining for COVID-19.  Denies fevers, chills.   ROS: per HPI  Pertinent PMHx: Allergies, HTN, heart murmur, DM, recently + for COVID -19   Exam:  Respiratory: frequently coughing throughout conversation, speaking in full sentences when not interrupted by coughing fit, no signs of respiratory compromise   Assessment/Plan:  Cough with exposure to COVID-19 virus Patient recently quarantined for +COVID-19 test 04/09/19. Persistently coughing despite OTC and prescribed antitussive agents.  -encouraged patient to continue to remain hydrated  -encouraged steam inhalation, encouraged to keep humidifier running in order to keep environment humidity between 40-60% -patient to continue tessalon perles rx     Time spent during visit with patient: 18 minutes

## 2019-04-27 DIAGNOSIS — R058 Other specified cough: Secondary | ICD-10-CM | POA: Insufficient documentation

## 2019-04-27 DIAGNOSIS — Z20822 Contact with and (suspected) exposure to covid-19: Secondary | ICD-10-CM | POA: Insufficient documentation

## 2019-04-27 NOTE — Assessment & Plan Note (Signed)
Patient recently quarantined for +COVID-19 test 04/09/19. Persistently coughing despite OTC and prescribed antitussive agents.  -encouraged patient to continue to remain hydrated  -encouraged steam inhalation, encouraged to keep humidifier running in order to keep environment humidity between 40-60% -patient to continue tessalon perles rx

## 2019-05-01 ENCOUNTER — Other Ambulatory Visit: Payer: Self-pay

## 2019-05-01 ENCOUNTER — Telehealth (INDEPENDENT_AMBULATORY_CARE_PROVIDER_SITE_OTHER): Payer: Medicare Other | Admitting: Family Medicine

## 2019-05-01 DIAGNOSIS — R05 Cough: Secondary | ICD-10-CM | POA: Diagnosis not present

## 2019-05-01 DIAGNOSIS — R059 Cough, unspecified: Secondary | ICD-10-CM

## 2019-05-01 DIAGNOSIS — R61 Generalized hyperhidrosis: Secondary | ICD-10-CM | POA: Diagnosis not present

## 2019-05-01 NOTE — Assessment & Plan Note (Signed)
Patient with continued cough x1 month.  Unclear etiology at this time.  Would benefit from in person exam as well as blood work and chest x-ray.  Dr. Andria Frames also agrees with this plan.  Differentials can include heart failure as patient has new orthopnea and PND.  Would benefit from a BNP as well as a CMP evaluation.  Have also ordered a chest x-ray which patient plans to go to [facility] imaging for.  Patient could also benefit from CBC to rule out any anemia.  Can consider GERD as patient has a history of this.  Have scheduled patient to be seen tomorrow at 1:30 PM and access to care clinic.  Would benefit from getting a weight there as well we have what appears to be patient's dry weight and is unsure what patient's weight is now.  Hopefully by then she can have chest x-ray results do to help for further clarify clinical picture.

## 2019-05-01 NOTE — Progress Notes (Signed)
[place] Telemedicine Visit I connected with  EMIKO TALLERICO on [id number] by a video enabled telemedicine application and verified that I am speaking with the correct person using two identifiers.   I discussed the limitations of evaluation and management by telemedicine. The patient expressed understanding and agreed to proceed.  Patient consented to have virtual visit. Method of visit: Video was attempted, but technology challenges prevented patient from using video, so visit was conducted via telephone.  Encounter participants: Patient: Norma [name] - located at [employer] (parked) Provider: Caroline More - located at [facility] Others (if applicable): none  Chief Complaint: cough   HPI: Cough  Patient was on antibiotics that helped with cough but now has been off abx and now has cough again. Diagnosed with COVID 19 on 12/18.Recently seen on 1/15 with Dr. Rosita Fire, at which time patient was recommended to use tessalon perles and use humidifier. Patient also reports increased sweating. Was seen by [facility] in the beginning of December which is when she got the antibiotics. Has been using tesselon perles which works some but does not get rid of cough. Cough is worse at night. She does cough throughout the day as well. Does report now having to use 3 pillows. Does report PND. No fevers, Tmax 99.5. Reports increased sweating, to the point that she has to change her clothes during the day. No edema. No h/o HF but does have an irregular heart beat which she sees a cardiologist for.   ROS: per HPI  Pertinent PMHx: acute laryngitis, heart arrhythmia, HTN, GERD  Exam:  Respiratory: speaking full sentences, no increased WOB   Assessment/Plan:  Cough Patient with continued cough x1 month.  Unclear etiology at this time.  Would benefit from in person exam as well as blood work and chest x-ray.  Dr. Andria Frames also agrees with this plan.  Differentials can include heart  failure as patient has new orthopnea and PND.  Would benefit from a BNP as well as a CMP evaluation.  Have also ordered a chest x-ray which patient plans to go to [facility] imaging for.  Patient could also benefit from CBC to rule out any anemia.  Can consider GERD as patient has a history of this.  Have scheduled patient to be seen tomorrow at 1:30 PM and access to care clinic.  Would benefit from getting a weight there as well we have what appears to be patient's dry weight and is unsure what patient's weight is now.  Hopefully by then she can have chest x-ray results do to help for further clarify clinical picture.  Diaphoresis Unclear etiology.  Can consider hot flashes.  Will benefit from CMP to rule out any electrolyte abnormalities as well as CBC.    Time spent during visit with patient: 15 minutes

## 2019-05-01 NOTE — Assessment & Plan Note (Signed)
Unclear etiology.  Can consider hot flashes.  Will benefit from CMP to rule out any electrolyte abnormalities as well as CBC.

## 2019-05-02 ENCOUNTER — Other Ambulatory Visit: Payer: Self-pay

## 2019-05-02 ENCOUNTER — Ambulatory Visit (INDEPENDENT_AMBULATORY_CARE_PROVIDER_SITE_OTHER): Payer: Medicare Other | Admitting: Family Medicine

## 2019-05-02 ENCOUNTER — Ambulatory Visit (HOSPITAL_COMMUNITY)
Admission: RE | Admit: 2019-05-02 | Discharge: 2019-05-02 | Disposition: A | Discharge status: Home / Self Care | Payer: Medicare Other | Source: Ambulatory Visit | Attending: Family Medicine | Admitting: Family Medicine

## 2019-05-02 ENCOUNTER — Ambulatory Visit
Admission: RE | Admit: 2019-05-02 | Discharge: 2019-05-02 | Disposition: A | Discharge status: Home / Self Care | Payer: Medicare Other | Source: Ambulatory Visit | Attending: Family Medicine | Admitting: Family Medicine

## 2019-05-02 VITALS — BP 120/70 | HR 87 | Ht 68.0 in | Wt 241.4 lb

## 2019-05-02 DIAGNOSIS — R61 Generalized hyperhidrosis: Secondary | ICD-10-CM

## 2019-05-02 DIAGNOSIS — R0602 Shortness of breath: Secondary | ICD-10-CM

## 2019-05-02 DIAGNOSIS — R05 Cough: Secondary | ICD-10-CM

## 2019-05-02 DIAGNOSIS — R059 Cough, unspecified: Secondary | ICD-10-CM

## 2019-05-02 DIAGNOSIS — R002 Palpitations: Secondary | ICD-10-CM | POA: Diagnosis not present

## 2019-05-02 DIAGNOSIS — R079 Chest pain, unspecified: Secondary | ICD-10-CM | POA: Diagnosis not present

## 2019-05-02 DIAGNOSIS — R7303 Prediabetes: Secondary | ICD-10-CM | POA: Diagnosis not present

## 2019-05-02 LAB — POCT GLYCOSYLATED HEMOGLOBIN (HGB A1C): HbA1c, POC (controlled diabetic range): 6.1 % (ref 0.0–7.0)

## 2019-05-02 MED ORDER — BENZONATATE 100 MG PO CAPS: 100.0000 mg | ORAL_CAPSULE | Freq: Three times a day (TID) | ORAL | 0 refills | Status: DC | PRN

## 2019-05-02 MED ORDER — GABAPENTIN 100 MG PO CAPS: 100.0000 mg | ORAL_CAPSULE | Freq: Every day | ORAL | 0 refills | Status: DC

## 2019-05-02 NOTE — Assessment & Plan Note (Signed)
History and exam most consistent with post-viral upper airway cough syndrome s/p COVID infection. Pneumonia not likely given no fevers, productive cough, and lungs clear on exam. CXR obtained earlier today without abnormalities on my read. Do have some concern for new onset heart failure given new PND and orthopnea though no leg swelling on exam and no weight gain and CXR without cardiomegaly. Will obtain BNP and echo to better assess. Considered arrhythmia especially given h/o irregular heart beat and increased sweating, though EKG without notable abnormalities today. Will also obtain CMP and CBC to assess for electrolyte abnormalities and anemia. Emergency precautions discussed with patient, see AVS for details.

## 2019-05-02 NOTE — Patient Instructions (Addendum)
It was great to see you!  Our plans for today:  - We are checking some labs today, we will call you or send you a letter with these results. - Go to your heart ultrasound appointment.  - If you develop nausea and/or chest pain, you should let us know and go to the Emergency Department. - A refill of you cough medicine was sent to the pharmacy.  Take care and seek immediate care sooner if you develop any concerns.   Dr. [facility]

## 2019-05-02 NOTE — Progress Notes (Signed)
Subjective:   Patient ID: Norma [name]    DOB: 1960-08-04, 59 y.o. female   MRN: [id number]  Norma [name] is a 59 y.o. female with a history of HTN, hot flashes, migraine, allergic rhinitis, GERD, diverticulosis, eczema, plantar fasciitis, obesity, uterine fibroids, hep C carrier, spinal stenosis, prediabetes, depression here for   Cough Seen previously in virtual visit 1/5 and yesterday for same for 37mth h/o cough with new PND and 4 pillow orthopnea. Prior +COVID 12/18. Cough is nonproductive, started 04/03/19. She has tried mucinex, alka-seltzer, robastussin with honey and hot tea, tessalon perles which helped her symptoms. She is not on ACEI for HTN control. She endorses occasional runny nose, feeling of mucous in the back of her throat, and shortness of breath. She denies acid reflux, chest pain, fevers, leg swelling, hemoptysis, weight loss or gain.   Sweating Endorses all over sweating and hot flashes, worsened since COVID and will have to change her clothes. Worse during the day, occurs all day long. Reports prior hot flashes since reaching menopause but symptoms were usually helped by black cohosh. Sometimes will get short of breath and tired, sometimes will also get dizzy and heart racing but doesn't seem to related to sweating episodes.   Review of Systems:  Per HPI.  Medications and smoking status reviewed.  Objective:   BP 120/70   Pulse 87   Ht 5\' 8"  (1.727 m)   Wt 241 lb 6 oz (109.5 kg)   SpO2 97%   BMI 36.70 kg/m  Vitals and nursing note reviewed.  General: obese female, in no acute distress with non-toxic appearance HEENT: normocephalic, atraumatic, moist mucous membranes. Oropharynx clear without erythema, exudate. Neck: supple, non-tender without lymphadenopathy CV: regular rate and rhythm without murmurs, rubs, or gallops, no lower extremity edema Lungs: clear to auscultation bilaterally with normal work of breathing. Normal WOB on RA.   Assessment & Plan:    Cough History and exam most consistent with post-viral upper airway cough syndrome s/p COVID infection. Pneumonia not likely given no fevers, productive cough, and lungs clear on exam. CXR obtained earlier today without abnormalities on my read. Do have some concern for new onset heart failure given new PND and orthopnea though no leg swelling on exam and no weight gain and CXR without cardiomegaly. Will obtain BNP and echo to better assess. Considered arrhythmia especially given h/o irregular heart beat and increased sweating, though EKG without notable abnormalities today. Will also obtain CMP and CBC to assess for electrolyte abnormalities and anemia. Emergency precautions discussed with patient, see AVS for details.   Diaphoresis Unclear etiology. Does not appear to be associated with other symptoms including dizziness, vision changes, shortness of breath, nausea. Low likelihood of cardiac etiology, EKG without abnormalities today. Will obtain electrolytes and CBC to r/o anemia though would be atypical. Most likely would be menopausal hot flashes exacerbated by recent COVID-19 infection. Emergency precautions reviewed, see AVS for details.   Orders Placed This Encounter  Procedures  . Brain natriuretic peptide  . CMP (comprehensive metabolic panel)    Order Specific Question:   Has the patient fasted?    Answer:   No  . CBC  . Ambulatory referral to Sleep Studies    Referral Priority:   Routine    Referral Type:   Consultation    Referral Reason:   Specialty Services Required    Number of Visits Requested:   1  . HgB A1c  . EKG 12-Lead    Route  chart to RN to complete scheduling  . ECHOCARDIOGRAM COMPLETE    Standing Status:   Future    Standing Expiration Date:   07/30/2020    Order Specific Question:   Where should this test be performed    Answer:   [facility])    Order Specific Question:   Does the patient weigh less than or greater than 250 lbs?     Answer:   Patient weighs less than 250 lbs    Order Specific Question:   Perflutren DEFINITY (image enhancing agent) should be administered unless hypersensitivity or allergy exist    Answer:   Administer Perflutren    Order Specific Question:   Reason for exam-Echo    Answer:   Palpitations  785.1 / R00.2   Meds ordered this encounter  Medications  . benzonatate (TESSALON) 100 MG capsule    Sig: Take 1-2 capsules (100-200 mg total) by mouth 3 (three) times daily as needed.    Dispense:  60 capsule    Refill:  0    Rory Percy, DO PGY-3, [facility] 05/02/2019 2:58 PM

## 2019-05-02 NOTE — Assessment & Plan Note (Signed)
Unclear etiology. Does not appear to be associated with other symptoms including dizziness, vision changes, shortness of breath, nausea. Low likelihood of cardiac etiology, EKG without abnormalities today. Will obtain electrolytes and CBC to r/o anemia though would be atypical. Most likely would be menopausal hot flashes exacerbated by recent COVID-19 infection. Emergency precautions reviewed, see AVS for details.

## 2019-05-03 ENCOUNTER — Telehealth: Payer: Self-pay | Admitting: *Deleted

## 2019-05-03 ENCOUNTER — Encounter: Payer: Self-pay | Admitting: Family Medicine

## 2019-05-03 LAB — CBC
Hematocrit: 41 % (ref 34.0–46.6)
Hemoglobin: 13.5 g/dL (ref 11.1–15.9)
MCH: 28.9 pg (ref 26.6–33.0)
MCHC: 32.9 g/dL (ref 31.5–35.7)
MCV: 88 fL (ref 79–97)
Platelets: 213 10*3/uL (ref 150–450)
RBC: 4.67 x10E6/uL (ref 3.77–5.28)
RDW: 13.3 % (ref 11.7–15.4)
WBC: 7 10*3/uL (ref 3.4–10.8)

## 2019-05-03 LAB — COMPREHENSIVE METABOLIC PANEL
ALT: 24 IU/L (ref 0–32)
AST: 31 IU/L (ref 0–40)
Albumin/Globulin Ratio: 1.4 (ref 1.2–2.2)
Albumin: 4.3 g/dL (ref 3.8–4.9)
Alkaline Phosphatase: 66 IU/L (ref 39–117)
BUN/Creatinine Ratio: 14 (ref 12–28)
BUN: 11 mg/dL (ref 8–27)
Bilirubin Total: 0.6 mg/dL (ref 0.0–1.2)
CO2: 25 mmol/L (ref 20–29)
Calcium: 9.7 mg/dL (ref 8.7–10.3)
Chloride: 102 mmol/L (ref 96–106)
Creatinine, Ser: 0.81 mg/dL (ref 0.57–1.00)
GFR calc Af Amer: 91 mL/min/{1.73_m2} (ref >59)
GFR calc non Af Amer: 79 mL/min/{1.73_m2} (ref >59)
Globulin, Total: 3 g/dL (ref 1.5–4.5)
Glucose: 101 mg/dL — ABNORMAL HIGH (ref 65–99)
Potassium: 3.9 mmol/L (ref 3.5–5.2)
Sodium: 140 mmol/L (ref 134–144)
Total Protein: 7.3 g/dL (ref 6.0–8.5)

## 2019-05-03 LAB — BRAIN NATRIURETIC PEPTIDE: BNP: <2.5 pg/mL (ref 0.0–100.0)

## 2019-05-03 NOTE — Telephone Encounter (Signed)
Pt informed. Kwane Rohl, CMA  

## 2019-05-03 NOTE — Telephone Encounter (Signed)
-----   Message from Norma More, DO sent at 05/03/2019  8:17 AM EST ----- Please inform patient that results of CXR are negative.

## 2019-05-04 ENCOUNTER — Ambulatory Visit (HOSPITAL_COMMUNITY): Admission: RE | Admit: 2019-05-04 | Payer: Medicare Other | Source: Ambulatory Visit

## 2019-05-17 ENCOUNTER — Institutional Professional Consult (permissible substitution): Payer: Medicare Other | Admitting: Neurology

## 2019-05-22 ENCOUNTER — Encounter: Payer: Self-pay | Admitting: Neurology

## 2019-05-24 DIAGNOSIS — Z79899 Other long term (current) drug therapy: Secondary | ICD-10-CM | POA: Diagnosis not present

## 2019-05-24 DIAGNOSIS — G8929 Other chronic pain: Secondary | ICD-10-CM | POA: Diagnosis not present

## 2019-05-24 DIAGNOSIS — M5442 Lumbago with sciatica, left side: Secondary | ICD-10-CM | POA: Diagnosis not present

## 2019-05-25 ENCOUNTER — Other Ambulatory Visit: Payer: Self-pay

## 2019-05-25 DIAGNOSIS — K219 Gastro-esophageal reflux disease without esophagitis: Secondary | ICD-10-CM

## 2019-05-25 MED ORDER — GABAPENTIN 100 MG PO CAPS: 100.0000 mg | ORAL_CAPSULE | Freq: Every day | ORAL | 0 refills | Status: DC

## 2019-05-25 MED ORDER — BENZONATATE 100 MG PO CAPS: 100.0000 mg | ORAL_CAPSULE | Freq: Three times a day (TID) | ORAL | 0 refills | Status: DC | PRN

## 2019-05-25 MED ORDER — OMEPRAZOLE 20 MG PO CPDR: 40.0000 mg | DELAYED_RELEASE_CAPSULE | Freq: Every day | ORAL | 2 refills | Status: AC

## 2019-05-30 ENCOUNTER — Other Ambulatory Visit: Payer: Self-pay | Admitting: *Deleted

## 2019-05-30 DIAGNOSIS — M722 Plantar fascial fibromatosis: Secondary | ICD-10-CM

## 2019-05-31 ENCOUNTER — Other Ambulatory Visit: Payer: Self-pay | Admitting: Family Medicine

## 2019-05-31 DIAGNOSIS — M549 Dorsalgia, unspecified: Secondary | ICD-10-CM

## 2019-05-31 MED ORDER — DICLOFENAC SODIUM 1 % EX GEL: CUTANEOUS | 1 refills | Status: DC

## 2019-06-12 DIAGNOSIS — E1140 Type 2 diabetes mellitus with diabetic neuropathy, unspecified: Secondary | ICD-10-CM | POA: Diagnosis not present

## 2019-06-12 DIAGNOSIS — R072 Precordial pain: Secondary | ICD-10-CM | POA: Diagnosis not present

## 2019-06-12 DIAGNOSIS — I10 Essential (primary) hypertension: Secondary | ICD-10-CM | POA: Diagnosis not present

## 2019-06-19 DIAGNOSIS — H04123 Dry eye syndrome of bilateral lacrimal glands: Secondary | ICD-10-CM | POA: Diagnosis not present

## 2019-06-19 DIAGNOSIS — H40003 Preglaucoma, unspecified, bilateral: Secondary | ICD-10-CM | POA: Diagnosis not present

## 2019-06-19 DIAGNOSIS — H527 Unspecified disorder of refraction: Secondary | ICD-10-CM | POA: Diagnosis not present

## 2019-06-19 DIAGNOSIS — R7303 Prediabetes: Secondary | ICD-10-CM | POA: Diagnosis not present

## 2019-06-21 DIAGNOSIS — M5442 Lumbago with sciatica, left side: Secondary | ICD-10-CM | POA: Diagnosis not present

## 2019-06-21 DIAGNOSIS — Z1159 Encounter for screening for other viral diseases: Secondary | ICD-10-CM | POA: Diagnosis not present

## 2019-06-21 DIAGNOSIS — Z79899 Other long term (current) drug therapy: Secondary | ICD-10-CM | POA: Diagnosis not present

## 2019-06-21 DIAGNOSIS — I10 Essential (primary) hypertension: Secondary | ICD-10-CM | POA: Diagnosis not present

## 2019-06-21 DIAGNOSIS — G8929 Other chronic pain: Secondary | ICD-10-CM | POA: Diagnosis not present

## 2019-06-21 DIAGNOSIS — E119 Type 2 diabetes mellitus without complications: Secondary | ICD-10-CM | POA: Diagnosis not present

## 2019-06-21 DIAGNOSIS — E7800 Pure hypercholesterolemia, unspecified: Secondary | ICD-10-CM | POA: Diagnosis not present

## 2019-07-02 ENCOUNTER — Other Ambulatory Visit: Payer: Self-pay | Admitting: Family Medicine

## 2019-07-02 DIAGNOSIS — Z1231 Encounter for screening mammogram for malignant neoplasm of breast: Secondary | ICD-10-CM

## 2019-07-04 ENCOUNTER — Telehealth (INDEPENDENT_AMBULATORY_CARE_PROVIDER_SITE_OTHER): Payer: Medicare Other | Admitting: Family Medicine

## 2019-07-04 ENCOUNTER — Other Ambulatory Visit: Payer: Self-pay

## 2019-07-04 DIAGNOSIS — R5383 Other fatigue: Secondary | ICD-10-CM | POA: Diagnosis not present

## 2019-07-04 NOTE — Progress Notes (Signed)
[place] Telemedicine Visit  Patient consented to have virtual visit. Method of visit: Telephone  Encounter participants: Patient: Norma [name] - located at home Provider: Rory Percy - located at [facility] Others (if applicable): n/a  Chief Complaint: trouble with throat, fatigue  HPI:  Throat concern States she is having to clear her throat all day. Feels like something is on it. Going on for a few months. Had [place] in 03/2019 but this happened before. Denies current cough, N/V, sore throat, headache, congestion, swelling, rashes. Occasional runny nose when she wears a mask.   Fatigue Feeling weak, no energy for a few weeks. States body feels heavy. Occasionally feels tightness in chest with lying down. Sleeps on 2-3 pillows at night. Also with occasional PND and sweating at night. Denies weight loss, fevers, hemoptysis, cough, muscle pain, dark or bloody stools, feeling down/depressed, leg swelling, chest pain. Has been told by grandchildren she snores. Does often have excessive daytime sleepiness and occasional morning headaches. Has been told she needs a sleep study. Previously seen for similar concern 04/2019 with normal CXR, EKG, CMP, CBC, BNP. Sees Dr. Doylene Canard with Cardiology for irregular heart beat and sees him later this month for an ECHO.   ROS: per HPI  Pertinent PMHx: HTN, GERD, allergic rhinitis, eczema, uterine fibroids, obesity, chronic pain syndrome, hep C carrier, prediabetes, depression, IDA, recent COVID  Exam:  Respiratory: speaks in full sentences, no resp distress  Assessment/Plan:  Fatigue Unclear etiology though leading dx is OSA given obesity, HTN, snoring, morning headaches, and PND. Will order sleep study. Previously with concern for new-onset heart failure s/p COVID infection given new PND and orthopnea, however workup at that time not convincing. Sees Cardiology later this month and will receive ECHO. Does have h/o depression  though denies current depressive features, would consider obtaining updated PHQ9 at follow up. Also with h/o anemia, will obtain CBC. Doubt infectious or autoimmune process given duration, lack of fever or other autoimmune features. Follow up interval determined by lab findings. Return and emergency precautions reviewed.   Throat concern Has h/o GERD so could be contributing though without N/V, sore throat. With h/o allergies/allergic rhinitis and recent COVID infection with post-viral syndrome, may have slight inflammatory/irritative process. In the absences of fever, cough, upper respiratory symptoms, recommend conservative management with warm teas, soups and lozenges. RTC if no better for in-person evaluation.  Time spent during visit with patient: 15 minutes

## 2019-07-05 DIAGNOSIS — R5383 Other fatigue: Secondary | ICD-10-CM | POA: Insufficient documentation

## 2019-07-05 NOTE — Assessment & Plan Note (Signed)
Unclear etiology though leading dx is OSA given obesity, HTN, snoring, morning headaches, and PND. Will order sleep study. Previously with concern for new-onset heart failure s/p COVID infection given new PND and orthopnea, however workup at that time not convincing. Sees Cardiology later this month and will receive ECHO. Does have h/o depression though denies current depressive features, would consider obtaining updated PHQ9 at follow up. Also with h/o anemia, will obtain CBC. Doubt infectious or autoimmune process given duration, lack of fever or other autoimmune features. Follow up interval determined by lab findings. Return and emergency precautions reviewed.

## 2019-07-19 ENCOUNTER — Other Ambulatory Visit: Payer: Self-pay | Admitting: Family Medicine

## 2019-07-19 DIAGNOSIS — M722 Plantar fascial fibromatosis: Secondary | ICD-10-CM

## 2019-07-23 DIAGNOSIS — G8929 Other chronic pain: Secondary | ICD-10-CM | POA: Diagnosis not present

## 2019-07-23 DIAGNOSIS — R768 Other specified abnormal immunological findings in serum: Secondary | ICD-10-CM | POA: Diagnosis not present

## 2019-07-23 DIAGNOSIS — Z79899 Other long term (current) drug therapy: Secondary | ICD-10-CM | POA: Diagnosis not present

## 2019-07-23 DIAGNOSIS — M5442 Lumbago with sciatica, left side: Secondary | ICD-10-CM | POA: Diagnosis not present

## 2019-07-24 ENCOUNTER — Ambulatory Visit
Admission: RE | Admit: 2019-07-24 | Discharge: 2019-07-24 | Disposition: A | Discharge status: Home / Self Care | Payer: Medicare Other | Source: Ambulatory Visit | Attending: *Deleted | Admitting: *Deleted

## 2019-07-24 ENCOUNTER — Other Ambulatory Visit: Payer: Self-pay

## 2019-07-24 DIAGNOSIS — Z1231 Encounter for screening mammogram for malignant neoplasm of breast: Secondary | ICD-10-CM | POA: Diagnosis not present

## 2019-07-31 ENCOUNTER — Ambulatory Visit (INDEPENDENT_AMBULATORY_CARE_PROVIDER_SITE_OTHER): Payer: Medicare Other | Admitting: Neurology

## 2019-07-31 ENCOUNTER — Other Ambulatory Visit: Payer: Self-pay

## 2019-07-31 ENCOUNTER — Ambulatory Visit (INDEPENDENT_AMBULATORY_CARE_PROVIDER_SITE_OTHER): Payer: Medicare Other | Admitting: Family Medicine

## 2019-07-31 ENCOUNTER — Encounter: Payer: Self-pay | Admitting: Family Medicine

## 2019-07-31 ENCOUNTER — Encounter: Payer: Self-pay | Admitting: Neurology

## 2019-07-31 VITALS — Temp 96.9°F | Ht 68.0 in | Wt 251.0 lb

## 2019-07-31 DIAGNOSIS — Z79891 Long term (current) use of opiate analgesic: Secondary | ICD-10-CM

## 2019-07-31 DIAGNOSIS — R0683 Snoring: Secondary | ICD-10-CM

## 2019-07-31 DIAGNOSIS — G478 Other sleep disorders: Secondary | ICD-10-CM

## 2019-07-31 DIAGNOSIS — E669 Obesity, unspecified: Secondary | ICD-10-CM | POA: Diagnosis not present

## 2019-07-31 DIAGNOSIS — G4719 Other hypersomnia: Secondary | ICD-10-CM | POA: Diagnosis not present

## 2019-07-31 DIAGNOSIS — M26609 Unspecified temporomandibular joint disorder, unspecified side: Secondary | ICD-10-CM | POA: Insufficient documentation

## 2019-07-31 DIAGNOSIS — R351 Nocturia: Secondary | ICD-10-CM

## 2019-07-31 MED ORDER — NAPROXEN 500 MG PO TABS: ORAL_TABLET | ORAL | 0 refills | Status: DC

## 2019-07-31 NOTE — Assessment & Plan Note (Signed)
History and physical exam most consistent with TMJ syndrome.  Low suspicion for middle ear infection despite some injection of the left tympanic membrane. -NSAIDs for pain relief (naproxen refilled) -Encouraged to try over-the-counter mouthguard -Treat conservatively for 2 weeks monitor for improvement.  If no improvement, see your dentist.

## 2019-07-31 NOTE — Progress Notes (Signed)
    SUBJECTIVE:   CHIEF COMPLAINT / HPI:   TMJ syndrome Ms. Fath notes that she has been having some pain by her left ear for roughly the past 2 weeks.  She reports this is a sensation of achy fullness of her left ear.  She notices this most when she is eating or chewing.  It can be irritating at other times of the day.  She has not noticed any significant improvement with her chronic pain medication, Percocet.  She denies difficulty hearing, difficulty swallowing, sore throat, new nasal congestion, fevers.  PERTINENT  PMH / PSH: Noncontributory  OBJECTIVE:   BP 128/70   Pulse 94   Temp 98.2 F (36.8 C) (Oral)   Wt 250 lb (113.4 kg)   SpO2 98%   BMI 38.01 kg/m    General: Well-appearing middle-aged woman.  No acute distress HEENT: Right tympanic membrane normal-appearing, left tympanic membrane with some erythema/injection without evidence of purulence or obvious effusion.  No significant pain with examination of the outer or inner ear.  Oropharynx normal without inflammation or lesions.  Appropriately moist.  Mild tenderness to palpation of the left TMJ.  ASSESSMENT/PLAN:   TMJ (temporomandibular joint disorder) History and physical exam most consistent with TMJ syndrome.  Low suspicion for middle ear infection despite some injection of the left tympanic membrane. -NSAIDs for pain relief (naproxen refilled) -Encouraged to try over-the-counter mouthguard -Treat conservatively for 2 weeks monitor for improvement.  If no improvement, see your dentist.     Matilde Haymaker, MD [place]

## 2019-07-31 NOTE — Progress Notes (Signed)
Subjective:    Patient ID: Norma [name] is a 59 y.o. female.  HPI     Star Age, MD, PhD [facility] [address], [address] [place], [place] [address]  Dear Dr. Ky Barban,  I saw your patient, Norma [name], upon your kind request in my sleep clinic today for initial consultation of her sleep disorder, in particular, concern for underlying obstructive sleep apnea.  The patient is unaccompanied today. She missed an appointment on 05/17/19. As you know, Norma [name] is a 59 year old right-handed woman with an underlying medical history of hypertension, diabetes, arthritis, back pain status post lumbar spine surgery, on chronic narcotic pain medication, depression, anxiety, allergies, acid reflux, shortness of breath, history of COVID-19 infection in December 2020, and obesity, who reports snoring and excessive daytime somnolence, non-restorative sleep.  I reviewed your office note from 05/02/2019.  Her Epworth sleepiness score is 6/24, fatigue severity score is 57 out of 63.  She lives with her daughter and daughter has 3 children including a newborn.  Patient does not share her bedroom, she has a TV on at night, does not like to sleep with the dog, volume is typically down.  She snores per family.  She denies any recurrent morning headaches but has nocturia about twice per average night.  She does not wake up rested and is tired during the day but does not nap.  She goes to bed around 10 but it may not be until 2:30 AM before she is asleep.  She has a rise time between 730 and 8.  She takes Percocet per pain management, up to 3/day as needed, typically only 1 time per day on average.  She is a non-smoker, does not drink alcohol, drinks soda occasionally.  Her Past Medical History Is Significant For: Past Medical History:  Diagnosis Date  . Acid reflux   . Allergy   . Anemia   . Anxiety   . Arthritis   . Back pain   . Depression   . Diabetes mellitus without  complication ([place])    on meds  . Headache    stress headaches, migraines at times  . Heart murmur   . Hypertension   . Irregular heart beat   . Left shoulder pain   . Shortness of breath dyspnea    with exertion  . Tachyarrhythmia   . Trigger point of thoracic region 03/22/2012    Her Past Surgical History Is Significant For: Past Surgical History:  Procedure Laterality Date  . ANKLE SURGERY    . KNEE SURGERY Left   . LUMBAR LAMINECTOMY/DECOMPRESSION MICRODISCECTOMY Left 06/06/2014   Procedure: LUMBAR LAMINECTOMY/DECOMPRESSION MICRODISCECTOMY 1 LEVEL;  Surgeon: Newman Pies, MD;  Location: [facility];  Service: Neurosurgery;  Laterality: Left;  Left L5S1 microdiskectomy  . ROTATOR CUFF REPAIR Left 02/20/14  . TUBAL LIGATION      Her Family History Is Significant For: Family History  Problem Relation Age of Onset  . Diabetes Brother   . Diabetes Maternal Aunt   . Diabetes Paternal Aunt   . Diabetes Maternal Grandmother   . Diabetes Maternal Grandfather   . Diabetes Paternal Grandmother   . Diabetes Paternal Grandfather   . Congestive Heart Failure Mother   . Hypertension Son   . Colon cancer Neg Hx   . Esophageal cancer Neg Hx   . Rectal cancer Neg Hx   . Stomach cancer Neg Hx     Her Social History Is Significant For: Social  History   Socioeconomic History  . Marital status: Single    Spouse name: Not on file  . Number of children: 4  . Years of education: 17  . Highest education level: 12th grade  Occupational History  . Not on file  Tobacco Use  . Smoking status: Never Smoker  . Smokeless tobacco: Never Used  Substance and Sexual Activity  . Alcohol use: Yes    Comment: glass of wine occasionally  . Drug use: No    Comment: States no longer uses marijuana  . Sexual activity: Yes    Birth control/protection: Condom  Other Topics Concern  . Not on file  Social History Narrative   Lives by herself, 8 year old son comes and goes. Has a pitbull  dog named Duchess. Lives in one level house with two steps in back. Going to start going back to church. Likes to go out to eat and go to movies. Watches grandchildren, has six with two expected. Has occasional food insecurity.    Social Determinants of Health   Financial Resource Strain:   . Difficulty of Paying Living Expenses:   Food Insecurity:   . Worried About Charity fundraiser in the Last Year:   . Arboriculturist in the Last Year:   Transportation Needs:   . Film/video editor (Medical):   Marland Kitchen Lack of Transportation (Non-Medical):   Physical Activity:   . Days of Exercise per Week:   . Minutes of Exercise per Session:   Stress:   . Feeling of Stress :   Social Connections:   . Frequency of Communication with Friends and Family:   . Frequency of Social Gatherings with Friends and Family:   . Attends Religious Services:   . Active Member of Clubs or Organizations:   . Attends Archivist Meetings:   Marland Kitchen Marital Status:     Her Allergies Are:  Allergies  Allergen Reactions  . Soma [Carisoprodol] Itching    Itching/rash around mouth, itching back of throat.   . Nortriptyline Rash  . Prednisone Rash    REACTION: rash and itching.   :   Her Current Medications Are:  Outpatient Encounter Medications as of 07/31/2019  Medication Sig  . benzonatate (TESSALON) 100 MG capsule Take 1-2 capsules (100-200 mg total) by mouth 3 (three) times daily as needed.  Marland Kitchen buPROPion (WELLBUTRIN XL) 300 MG 24 hr tablet Take 300 mg by mouth daily.  . cetirizine (ZYRTEC) 10 MG tablet Take 1 tablet (10 mg total) by mouth daily.  . Cholecalciferol (VITAMIN D) 2000 units CAPS Take 1 capsule (2,000 Units total) by mouth daily.  . cyclobenzaprine (FLEXERIL) 10 MG tablet TAKE 1 TABLET BY MOUTH TWICE A DAY AS NEEDED FOR MUSCLE SPASMS  . diclofenac Sodium (VOLTAREN) 1 % GEL APPLY 2 G TOPICALLY 2 (TWO) TIMES DAILY AS NEEDED (HEEL PAIN).  Marland Kitchen gabapentin (NEURONTIN) 100 MG capsule Take 1 capsule  (100 mg total) by mouth at bedtime.  . hydrochlorothiazide (HYDRODIURIL) 25 MG tablet Take 1 tablet (25 mg total) by mouth daily.  . hydrOXYzine (ATARAX/VISTARIL) 25 MG tablet Take 1 tablet (25 mg total) by mouth 3 (three) times daily as needed.  . loratadine (CLARITIN) 10 MG tablet Take 1 tablet (10 mg total) by mouth daily.  . metFORMIN (GLUCOPHAGE) 500 MG tablet Take 500 mg by mouth daily.  . metoprolol succinate (TOPROL-XL) 25 MG 24 hr tablet Take 25 mg by mouth daily.  . metoprolol tartrate (LOPRESSOR) 25  MG tablet Take 1 tablet (25 mg total) by mouth 2 (two) times daily.  . multivitamin-iron-minerals-folic acid (CENTRUM) chewable tablet Chew 1 tablet daily by mouth.  . naproxen (NAPROSYN) 500 MG tablet TAKE 1 TABLET BY MOUTH 2 TIMES DAILY WITH A MEAL.  Marland Kitchen omeprazole (PRILOSEC) 20 MG capsule Take 2 capsules (40 mg total) by mouth daily.  Marland Kitchen oxyCODONE-acetaminophen (PERCOCET) 10-325 MG tablet Take 1 tablet by mouth 3 (three) times daily.  . promethazine-dextromethorphan (PROMETHAZINE-DM) 6.25-15 MG/5ML syrup Take 5 mLs by mouth at bedtime as needed for cough.  . traZODone (DESYREL) 100 MG tablet TAKE 2 TABLETS BY MOUTH ONCE AT BEDTIME  . triamcinolone ointment (KENALOG) 0.5 % Apply 1 application topically 2 (two) times daily.  . Vitamin D, Ergocalciferol, (DRISDOL) 50000 units CAPS capsule TAKE 1 CAPSULE BY MOUTH EVERY 7 (SEVEN) DAYS.  . [DISCONTINUED] ferrous sulfate 325 (65 FE) MG tablet Take 1 tablet (325 mg total) by mouth 2 (two) times daily with a meal.  . [DISCONTINUED] fluticasone (FLONASE) 50 MCG/ACT nasal spray Place 2 sprays into both nostrils daily.  . [DISCONTINUED] guaiFENesin-codeine 100-10 MG/5ML syrup Take 10 mLs by mouth 3 (three) times daily as needed for cough. (Patient not taking: Reported on 07/31/2019)  . [DISCONTINUED] HYDROcodone-homatropine (HYCODAN) 5-1.5 MG/5ML syrup Take 5 mLs by mouth at bedtime as needed. (Patient not taking: Reported on 07/31/2019)  . [DISCONTINUED]  traZODone (DESYREL) 100 MG tablet TAKE 2 TABLETS BY MOUTH ONCE AT BEDTIME  . [DISCONTINUED] traZODone (DESYREL) 100 MG tablet SMARTSIG:2 Tablet(s) By Mouth Every Night   No facility-administered encounter medications on file as of 07/31/2019.  :  Review of Systems:  Out of a complete 14 point review of systems, all are reviewed and negative with the exception of these symptoms as listed below: Review of Systems  Constitutional: Negative.   HENT: Negative.   Eyes: Negative.   Respiratory: Positive for shortness of breath.   Cardiovascular: Positive for palpitations.  Gastrointestinal: Negative.   Endocrine: Positive for heat intolerance.  Genitourinary: Negative.   Musculoskeletal: Positive for back pain, neck pain and neck stiffness.  Skin: Negative.   Allergic/Immunologic: Negative.   Neurological: Positive for numbness and headaches.       Numbness in legs and feet  Epworth Sleepiness Scale 0= would never doze 1= slight chance of dozing 2= moderate chance of dozing 3= high chance of dozing  Sitting and reading:1 Watching TV:1 Sitting inactive in a public place (ex. Theater or meeting):1 As a passenger in a car for an hour without a break:1 Lying down to rest in the afternoon:1 Sitting and talking to someone:0 Sitting quietly after lunch (no alcohol): 1 In a car, while stopped in traffic:0 Total:6   Psychiatric/Behavioral: Positive for agitation and sleep disturbance.    Objective:  Neurological Exam  Physical Exam Physical Examination:   Vitals:   07/31/19 1321  Temp: (!) 96.9 F (36.1 C)    General Examination: The patient is a very pleasant 59 y.o. female in no acute distress. She appears well-developed and well-nourished and well groomed.   HEENT: Normocephalic, atraumatic, pupils are equal, round and reactive to light, extraocular tracking is good without limitation to gaze excursion or nystagmus noted. Hearing is grossly intact. Face is symmetric with  normal facial animation. Speech is clear with no dysarthria noted. There is no hypophonia. There is no lip, neck/head, jaw or voice tremor. Neck is supple with full range of passive and active motion. There are no carotid bruits on auscultation. Oropharynx  exam reveals: moderate mouth dryness, adequate dental hygiene and marked airway crowding, due to tonsils of 2+ and redundant soft palate. Mallampati is class III. Tongue protrudes centrally and palate elevates symmetrically. Neck size is 17 inches. She has a minimal overbite.   Chest: Clear to auscultation without wheezing, rhonchi or crackles noted.  Heart: S1+S2+0, regular and normal without murmurs, rubs or gallops noted.   Abdomen: Soft, non-tender and non-distended with normal bowel sounds appreciated on auscultation.  Extremities: There is no pitting edema in the distal lower extremities bilaterally.   Skin: Warm and dry without trophic changes noted.   Musculoskeletal: exam reveals low back discomfort.   Neurologically:  Mental status: The patient is awake, alert and oriented in all 4 spheres. Her immediate and remote memory, attention, language skills and fund of knowledge are appropriate. There is no evidence of aphasia, agnosia, apraxia or anomia. Speech is clear with normal prosody and enunciation. Thought process is linear. Mood is normal and affect is normal.  Cranial nerves II - XII are as described above under HEENT exam.  Motor exam: Normal bulk, strength and tone is noted. There is no tremor, Romberg is negative. Fine motor skills and coordination: grossly intact.  Cerebellar testing: No dysmetria or intention tremor. There is no truncal or gait ataxia.  Sensory exam: intact to light touch in the upper and lower extremities.  Gait, station and balance: She stands easily. No veering to one side is noted. No leaning to one side is noted. Posture is age-appropriate and stance is narrow based. Gait shows slightly slow and cautious  gait. Tandem walk is slow but doable.                 Assessment and Plan:   In summary, Norma [name] is a very pleasant 59 y.o.-year old female with an underlying medical history of hypertension, diabetes, arthritis, back pain status post lumbar spine surgery, on chronic narcotic pain medication, depression, anxiety, allergies, acid reflux, shortness of breath, history of COVID-19 infection in December 2020, and obesity, whose history and physical exam are concerning for obstructive sleep apnea (OSA). I had a long chat with the patient about my findings and the diagnosis of OSA, its prognosis and treatment options. We talked about medical treatments, surgical interventions and non-pharmacological approaches. I explained in particular the risks and ramifications of untreated moderate to severe OSA, especially with respect to developing cardiovascular disease down the Road, including congestive heart failure, difficult to treat hypertension, cardiac arrhythmias, or stroke. Even type 2 diabetes has, in part, been linked to untreated OSA. Symptoms of untreated OSA include daytime sleepiness, memory problems, mood irritability and mood disorder such as depression and anxiety, lack of energy, as well as recurrent headaches, especially morning headaches. We talked about trying to maintain a healthy lifestyle in general, as well as the importance of weight control. We also talked about the importance of good sleep hygiene. I recommended the following at this time: sleep study. She would prefer a home sleep test.  I explained the sleep test procedure to the patient (home sleep test vs. NPSG in lab) and also outlined possible surgical and non-surgical treatment options of OSA, including the use of a custom-made dental device (which would require a referral to a specialist dentist or oral surgeon), upper airway surgical options, such as traditional UPPP or a novel less invasive surgical option in the form of Inspire  hypoglossal nerve stimulation (which would involve a referral to an ENT surgeon). I  also explained the CPAP treatment option to the patient, who indicated that she would be willing to try CPAP if the need arises. I explained the importance of being compliant with PAP treatment, not only for insurance purposes but primarily to improve Her symptoms, and for the patient's long term health benefit, including to reduce Her cardiovascular risks. I answered all her questions today and the patient was in agreement. I plan to see her back after the sleep study is completed and encouraged her to call with any interim questions, concerns, problems or updates.   Thank you very much for allowing me to participate in the care of this nice patient. If I can be of any further assistance to you please do not hesitate to call me at [phone].  Sincerely,   Star Age, MD, PhD

## 2019-07-31 NOTE — Patient Instructions (Signed)

## 2019-07-31 NOTE — Patient Instructions (Signed)

## 2019-08-12 ENCOUNTER — Ambulatory Visit (HOSPITAL_COMMUNITY)
Admission: EM | Admit: 2019-08-12 | Discharge: 2019-08-12 | Disposition: A | Discharge status: Home / Self Care | Payer: Medicare Other | Attending: Family Medicine | Admitting: Family Medicine

## 2019-08-12 ENCOUNTER — Other Ambulatory Visit: Payer: Self-pay

## 2019-08-12 ENCOUNTER — Encounter (HOSPITAL_COMMUNITY): Payer: Self-pay | Admitting: Family Medicine

## 2019-08-12 DIAGNOSIS — M7062 Trochanteric bursitis, left hip: Secondary | ICD-10-CM | POA: Diagnosis not present

## 2019-08-12 LAB — POCT URINALYSIS DIP (DEVICE)
Bilirubin Urine: NEGATIVE
Glucose, UA: NEGATIVE mg/dL
Hgb urine dipstick: NEGATIVE
Ketones, ur: NEGATIVE mg/dL
Nitrite: NEGATIVE
Protein, ur: 30 mg/dL — AB
Specific Gravity, Urine: >=1.03 (ref 1.005–1.030)
Urobilinogen, UA: 0.2 mg/dL (ref 0.0–1.0)
pH: 5.5 (ref 5.0–8.0)

## 2019-08-12 MED ORDER — METHYLPREDNISOLONE 4 MG PO TABS: 4.0000 mg | ORAL_TABLET | Freq: Two times a day (BID) | ORAL | 1 refills | Status: DC

## 2019-08-12 NOTE — Discharge Instructions (Addendum)
If not improving in 1 - 2 days, contact your primary care provider.

## 2019-08-12 NOTE — ED Triage Notes (Signed)
Pt reports her temp was 101.0 F last night. Pt reports having hip pain x 3 days, lower abdominal pain x 1 day.

## 2019-08-12 NOTE — ED Provider Notes (Signed)
[place]    CSN: [id number] Arrival date & time: 08/12/19  1558      History   Chief Complaint Chief Complaint  Patient presents with  . Fever  . Hip Pain    HPI Norma [name] is a 59 y.o. female.   Established [facility] patient visit  60 yo woman with right hip pain who was diagnosed at the end of last year with COVID>  Patient is on chronic oxycodone.  Pt reports her temp was 101.0 F last night. Pt reports having left hip pain x 7 days, lower abdominal pain x 1 day.    Patient states that she is having one week of progressive left lateral hip pain that was much worse yesterday.  Pain is aggravated by lying on left side.  Pain is worse with weight bearing and patient is using a cane to ambulate.  She has had trochanteric bursitis before and had injection into the hip area.  She also notes some bilateral lower abdominal aches from time to time unrelated to urination.  No dysuria.  Patient works as a [employer] manager.  She last worked two days ago and wants to go back tomorrow.     Past Medical History:  Diagnosis Date  . Acid reflux   . Allergy   . Anemia   . Anxiety   . Arthritis   . Back pain   . Depression   . Diabetes mellitus without complication ([place])    on meds  . Headache    stress headaches, migraines at times  . Heart murmur   . Hypertension   . Irregular heart beat   . Left shoulder pain   . Shortness of breath dyspnea    with exertion  . Tachyarrhythmia   . Trigger point of thoracic region 03/22/2012    Patient Active Problem List   Diagnosis Date Noted  . TMJ (temporomandibular joint disorder) 07/31/2019  . Fatigue 07/05/2019    05/01/2019    04/27/2019  . Lab test positive for detection of COVID-19 virus 04/10/2019  .    Marland Kitchen Greater trochanteric bursitis, left 02/27/2019    02/27/2019      . Bilateral hearing loss 01/23/2019    12/19/2018  . Allergic rhinitis with postnasal drip 11/17/2018  . Diverticulosis of colon without hemorrhage  06/23/2018    06/23/2018  . Intractable migraine without aura and without status migrainosus 02/16/2018    11/30/2017    07/06/2017  . Iron deficiency anemia 03/29/2017  . Vitamin D deficiency 03/01/2017    01/11/2017  . Environmental allergies 11/03/2016  . Flank pain, chronic 08/27/2014  . Other cyst of bone, right ankle and foot 06/21/2014  . Lumbar herniated disc 06/06/2014  . Depression     01/14/2014  . Spinal stenosis, lumbar region, with neurogenic claudication 11/21/2013    06/06/2013  . Left knee pain 01/10/2013  . Decreased hearing 12/29/2011    06/30/2011  . Eczema, dyshidrotic 06/02/2010  . Chronic pain of right ankle 05/06/2010  . FIBROIDS, UTERUS 10/24/2009  . GERD 06/09/2009    10/04/2008  . HIP PAIN, LEFT, CHRONIC 08/16/2007  . Hepatitis C carrier ([place]) 03/21/2007  . Obesity 06/16/2006  . HYPERTENSION, BENIGN SYSTEMIC 06/16/2006    Past Surgical History:  Procedure Laterality Date  . ANKLE SURGERY    . KNEE SURGERY Left   . LUMBAR LAMINECTOMY/DECOMPRESSION MICRODISCECTOMY Left 06/06/2014   Procedure: LUMBAR LAMINECTOMY/DECOMPRESSION MICRODISCECTOMY 1 LEVEL;  Surgeon: Newman Pies, MD;  Location: [facility];  Service: Neurosurgery;  Laterality: Left;  Left L5S1 microdiskectomy  . ROTATOR CUFF REPAIR Left 02/20/14  . TUBAL LIGATION      OB History    Gravida  4   Para  4   Term  4   Preterm      AB  0   Living  4     SAB  0   TAB  0   Ectopic  0   Multiple  0   Live Births  4            Home Medications    Prior to Admission medications   Medication Sig Start Date End Date Taking? Authorizing Provider  buPROPion (WELLBUTRIN XL) 300 MG 24 hr tablet Take 300 mg by mouth daily.    [provider]  cyclobenzaprine (FLEXERIL) 10 MG tablet TAKE 1 TABLET BY MOUTH TWICE A DAY AS NEEDED FOR MUSCLE SPASMS 12/23/17   Meccariello, Bernita Raisin, DO  gabapentin (NEURONTIN) 100 MG capsule Take 1 capsule (100 mg total) by mouth at  bedtime. 05/25/19   Meccariello, Bernita Raisin, DO  hydrochlorothiazide (HYDRODIURIL) 25 MG tablet Take 1 tablet (25 mg total) by mouth daily. 11/02/16   Everrett Coombe, MD  methylPREDNISolone (MEDROL) 4 MG tablet Take 1 tablet (4 mg total) by mouth 2 (two) times daily. 08/12/19   Robyn Haber, MD  metoprolol succinate (TOPROL-XL) 25 MG 24 hr tablet Take 25 mg by mouth daily. 07/23/19   [provider]  metoprolol tartrate (LOPRESSOR) 25 MG tablet Take 1 tablet (25 mg total) by mouth 2 (two) times daily. 05/13/14   [name], MD  multivitamin-iron-minerals-folic acid (CENTRUM) chewable tablet Chew 1 tablet daily by mouth. 03/01/17   [name], MD  omeprazole (PRILOSEC) 20 MG capsule Take 2 capsules (40 mg total) by mouth daily. 05/25/19   Meccariello, Bernita Raisin, DO  oxyCODONE-acetaminophen (PERCOCET) 10-325 MG tablet Take 1 tablet by mouth 3 (three) times daily. 07/23/19   [provider]  traZODone (DESYREL) 100 MG tablet TAKE 2 TABLETS BY MOUTH ONCE AT BEDTIME 05/31/19   Meccariello, Bernita Raisin, DO  Vitamin D, Ergocalciferol, (DRISDOL) 50000 units CAPS capsule TAKE 1 CAPSULE BY MOUTH EVERY 7 (SEVEN) DAYS. 12/09/17   Meccariello, Bernita Raisin, DO  cetirizine (ZYRTEC) 10 MG tablet Take 1 tablet (10 mg total) by mouth daily. 04/10/19 08/12/19  Milus Banister C, DO  ferrous sulfate 325 (65 FE) MG tablet Take 1 tablet (325 mg total) by mouth 2 (two) times daily with a meal. 02/22/19 04/14/19  Shirley, [place], DO  fluticasone [facility]) 50 MCG/ACT nasal spray Place 2 sprays into both nostrils daily. 04/10/19 04/14/19  Daisy Floro, DO  loratadine (CLARITIN) 10 MG tablet Take 1 tablet (10 mg total) by mouth daily. 04/11/19 08/12/19  Meccariello, Bernita Raisin, DO  traZODone (DESYREL) 100 MG tablet TAKE 2 TABLETS BY MOUTH ONCE AT BEDTIME 09/01/18   Daisy Floro, DO    Family History Family History  Problem Relation Age of Onset  . Diabetes Brother   . Diabetes Maternal Aunt   .  Diabetes Paternal Aunt   . Diabetes Maternal Grandmother   . Diabetes Maternal Grandfather   . Diabetes Paternal Grandmother   . Diabetes Paternal Grandfather   . Congestive Heart Failure Mother   . Hypertension Son   . Colon cancer Neg Hx   . Esophageal cancer Neg Hx   . Rectal cancer Neg Hx   . Stomach cancer Neg Hx  Social History Social History   Tobacco Use  . Smoking status: Never Smoker  . Smokeless tobacco: Never Used  Substance Use Topics  . Alcohol use: Yes    Comment: glass of wine occasionally  . Drug use: No    Comment: States no longer uses marijuana     Allergies   Soma [carisoprodol], Nortriptyline, and Prednisone   Review of Systems Review of Systems  Constitutional: Positive for fever.  Gastrointestinal: Positive for abdominal pain.  Endocrine: Negative.   Musculoskeletal: Positive for gait problem.  All other systems reviewed and are negative.    Physical Exam Triage Vital Signs ED Triage Vitals  Enc Vitals Group     BP      Pulse      Resp      Temp      Temp src      SpO2      Weight      Height      Head Circumference      Peak Flow      Pain Score      Pain Loc      Pain Edu?      Excl. in [place]?    No data found.  Updated Vital Signs BP (!) 142/77 (BP Location: Right Arm)   Pulse 88   Temp 98.9 F (37.2 C) (Oral)   Resp 20   SpO2 100%    Physical Exam Vitals and nursing note reviewed.  Constitutional:      General: She is not in acute distress.    Appearance: Normal appearance. She is obese. She is not ill-appearing, toxic-appearing or diaphoretic.     Comments: Patient is well dressed with facial makeup and false eyelashes  HENT:     Head: Normocephalic.  Eyes:     Conjunctiva/sclera: Conjunctivae normal.  Pulmonary:     Effort: Pulmonary effort is normal.     Breath sounds: Normal breath sounds.  Abdominal:     Palpations: There is no mass.     Tenderness: There is no abdominal tenderness. There is no  guarding or rebound.  Musculoskeletal:        General: Tenderness present. No swelling, deformity or signs of injury.     Cervical back: Normal range of motion and neck supple.     Right lower leg: No edema.     Left lower leg: No edema.     Comments: Full passive ROM of both hips.  Mild tenderness over left trochanter.  Skin:    General: Skin is warm and dry.  Neurological:     General: No focal deficit present.     Mental Status: She is alert and oriented to person, place, and time.  Psychiatric:        Mood and Affect: Mood normal.      UC Treatments / Results  Labs (all labs ordered are listed, but only abnormal results are displayed) Labs Reviewed  POCT URINALYSIS DIP (DEVICE) - Abnormal; Notable for the following components:      Result Value   Protein, ur 30 (*)    Leukocytes,Ua TRACE (*)    All other components within normal limits    EKG   Radiology Hip films from 06/28/2017: CLINICAL DATA:  Bilateral hip pain for 3 years, some which radiates to the legs left more so than right  EXAM: DG HIP (WITH OR WITHOUT PELVIS) 5+V BILAT  COMPARISON:  None.  FINDINGS: There is mild degenerative change involving both  hips with some loss of joint space and spurring. However, no acute fracture is seen. The pelvic rami are intact. The SI joints are corticated. There are degenerative changes in the lower lumbar spine.  IMPRESSION: Mild to moderate degenerative joint disease of both hips. No acute abnormality.   Electronically Signed   By: Ivar Drape M.D.   On: 06/28/2017 15:10 Procedures Procedures (including critical care time)  Medications Ordered in UC Medications - No data to display  Initial Impression / Assessment and Plan / UC Course  I have reviewed the triage vital signs and the nursing notes.  Pertinent labs & imaging results that were available during my care of the patient were reviewed by me and considered in my medical decision making (see  chart for details).    Final Clinical Impressions(s) / UC Diagnoses   Final diagnoses:  Trochanteric bursitis, left hip   Discharge Instructions   None    ED Prescriptions    Medication Sig Dispense Auth. Provider   methylPREDNISolone (MEDROL) 4 MG tablet Take 1 tablet (4 mg total) by mouth 2 (two) times daily. 10 tablet Robyn Haber, MD     I have reviewed the PDMP during this encounter.   Robyn Haber, MD 08/12/19 1747

## 2019-08-21 ENCOUNTER — Ambulatory Visit (INDEPENDENT_AMBULATORY_CARE_PROVIDER_SITE_OTHER): Payer: Medicare Other | Admitting: Family Medicine

## 2019-08-21 ENCOUNTER — Other Ambulatory Visit: Payer: Self-pay

## 2019-08-21 VITALS — BP 114/70 | HR 68 | Wt 247.0 lb

## 2019-08-21 DIAGNOSIS — I10 Essential (primary) hypertension: Secondary | ICD-10-CM

## 2019-08-21 DIAGNOSIS — M7062 Trochanteric bursitis, left hip: Secondary | ICD-10-CM

## 2019-08-21 DIAGNOSIS — G8929 Other chronic pain: Secondary | ICD-10-CM | POA: Diagnosis not present

## 2019-08-21 DIAGNOSIS — M5442 Lumbago with sciatica, left side: Secondary | ICD-10-CM | POA: Diagnosis not present

## 2019-08-21 DIAGNOSIS — Z23 Encounter for immunization: Secondary | ICD-10-CM | POA: Diagnosis not present

## 2019-08-21 MED ORDER — ZOSTER VAC RECOMB ADJUVANTED 50 MCG/0.5ML IM SUSR: 0.5000 mL | Freq: Once | INTRAMUSCULAR | 0 refills | Status: AC

## 2019-08-21 NOTE — Assessment & Plan Note (Signed)
Assessment: Greater trochanteric bursitis of the left hip which has been improving since patient's recent urgent care visit.  Other causes for left-sided hip pain can include trauma, osteoarthritis, muscle sprain or other MSK etiologies.  Patient without history of trauma, and with a past history of increased swelling in the area of the bursa trochanteric bursitis is most likely cause.  Patient also with discomfort to palpation of the left greater trochanter which coincides with a diagnosis of trochanteric bursitis. Plan: -Provided patient physical therapy referral to work on strengthening of her hamstrings, quadriceps, and particularly her gluteal muscles -Recommended patient plan to do this physical therapy exercises least twice per day for 3-4 weeks -Should patient still have pain after this recommend patient follow back up. -As patient's pain has improved do not feel need to do medical intervention at this time.

## 2019-08-21 NOTE — Progress Notes (Signed)
   Subjective:    Patient ID: [name], female    DOB: [birth year], 59 y.o.   MRN: [id number]   CC: Hip pain  HPI:  Ms. Frakes is a very pleasant 59 year old female who presents today to discuss her hip pain.  She recently went to the emergency department on the 25th due to this left-sided hip pain where she received x-rays and a diagnosis of trochanteric bursitis.  X-rays reviewed from 2019 show mild degenerative changes of both hips. Patient denies recent falls/trauma. Has been hurting for past few weeks.  Patient states her left hip had a large swollen area that has since improved.  She states her pain is also improved since this area of swelling decreased..  No bowel or bladder incontinence, no saddle paraesthesias.   ROS: pertinent noted in the HPI   Pertinent PMH, PSH, FH, SoHx: Past medical history of chronic back pain with previous back surgery.  Objective:  BP 114/70   Pulse 68   Wt 247 lb (112 kg)   BMI 37.56 kg/m   Vitals and nursing note reviewed  General: NAD, pleasant, able to participate in exam Respiratory: No respiratory distress noted MSK: Strength 5/5 in hip flexor bilaterally, strength 5/5 in knee extension bilaterally. Negative straight leg raise test.  Some pain to palpation of left greater trochanter, no swelling present. Skin: warm and dry, no rashes noted  Assessment & Plan:    Greater trochanteric bursitis, left Assessment: Greater trochanteric bursitis of the left hip which has been improving since patient's recent urgent care visit.  Other causes for left-sided hip pain can include trauma, osteoarthritis, muscle sprain or other MSK etiologies.  Patient without history of trauma, and with a past history of increased swelling in the area of the bursa trochanteric bursitis is most likely cause.  Patient also with discomfort to palpation of the left greater trochanter which coincides with a diagnosis of trochanteric bursitis. Plan: -Provided patient  physical therapy referral to work on strengthening of her hamstrings, quadriceps, and particularly her gluteal muscles -Recommended patient plan to do this physical therapy exercises least twice per day for 3-4 weeks -Should patient still have pain after this recommend patient follow back up. -As patient's pain has improved do not feel need to do medical intervention at this time.    Lurline Del, [place] [facility] PGY-1

## 2019-08-21 NOTE — Patient Instructions (Addendum)
It was great to see you!  Our plans for today:  - Today we discussed your hip pain, after speaking to you more about this and doing physical exam I believe this is trochanteric bursitis. -I am sending in a referral for physical therapy to help strengthen your quadriceps, hamstrings, and glutes as this can help with trochanteric bursitis. -I am glad that your symptoms are improving since your last urgent care visit. -If physical therapy does not contact you in the next 2 weeks please let me know and I will resend the referral.  Take care and seek immediate care sooner if you develop any concerns.   Dr. [facility]   Hip Bursitis  Hip bursitis is swelling of a fluid-filled sac (bursa) in your hip joint. This swelling (inflammation) can be painful. This condition may come and go over time. What are the causes?  Injury to the hip.  Overuse of the muscles that surround the hip joint.  An earlier injury or surgery of the hip.  Arthritis or gout.  Diabetes.  Thyroid disease.  Infection.  In some cases, the cause may not be known. What are the signs or symptoms?  Mild or moderate pain in the hip area. Pain may get worse with movement.  Tenderness and swelling of the hip, especially on the outer side of the hip.  In rare cases, the bursa may become infected. This may cause: ? A fever. ? Warmth and redness in the area. Symptoms may come and go. How is this treated? This condition is treated by resting, icing, applying pressure (compression), and raising (elevating) the injured area. You may hear this called the RICE treatment. Treatment may also include:  Using crutches.  Draining fluid out of the bursa to help relieve swelling.  Giving a shot of (injecting) medicine that helps to reduce swelling (cortisone).  Other medicines if the bursa is infected. Follow these instructions at home: Managing pain, stiffness, and swelling   If told, put ice on the  painful area. ? Put ice in a plastic bag. ? Place a towel between your skin and the bag. ? Leave the ice on for 20 minutes, 2-3 times a day. ? Raise (elevate) your hip above the level of your heart as much as you can without pain. To do this, try putting a pillow under your hips while you lie down. Stop if this causes pain. Activity  Return to your normal activities as told by your doctor. Ask your doctor what activities are safe for you.  Rest and protect your hip as much as you can until you feel better. General instructions  Take over-the-counter and prescription medicines only as told by your doctor.  Wear wraps that put pressure on your hip (compression wraps) only as told by your doctor.  Do not use your hip to support your body weight until your doctor says that you can.  Use crutches as told by your doctor.  Gently rub and stretch your injured area as often as is comfortable.  Keep all follow-up visits as told by your doctor. This is important. How is this prevented?  Exercise regularly, as told by your doctor.  Warm up and stretch before being active.  Cool down and stretch after being active.  Avoid activities that bother your hip or cause pain.  Avoid sitting down for long periods at a time. Contact a doctor if:  You have a fever.  You get new symptoms.  You have trouble walking.  You have trouble doing everyday activities.  You have pain that gets worse.  You have pain that does not get better with medicine.  You get red skin on your hip area.  You get a feeling of warmth in your hip area. Get help right away if:  You cannot move your hip.  You have very bad pain. Summary  Hip bursitis is swelling of a fluid-filled sac (bursa) in your hip.  Hip bursitis can be painful.  Symptoms often come and go over time.  This condition is treated with rest, ice, compression, elevation, and medicines. This information is not intended to replace advice  given to you by your health care provider. Make sure you discuss any questions you have with your health care provider. Document Revised: 12/12/2017 Document Reviewed: 12/12/2017 Elsevier Patient Education  [place].

## 2019-08-22 ENCOUNTER — Ambulatory Visit (INDEPENDENT_AMBULATORY_CARE_PROVIDER_SITE_OTHER): Payer: Medicare Other | Admitting: Family Medicine

## 2019-08-22 VITALS — BP 130/68 | HR 107 | Ht 68.0 in | Wt 246.4 lb

## 2019-08-22 DIAGNOSIS — G8929 Other chronic pain: Secondary | ICD-10-CM

## 2019-08-22 DIAGNOSIS — M5441 Lumbago with sciatica, right side: Secondary | ICD-10-CM | POA: Diagnosis not present

## 2019-08-22 DIAGNOSIS — M5442 Lumbago with sciatica, left side: Secondary | ICD-10-CM | POA: Diagnosis not present

## 2019-08-22 MED ORDER — KETOROLAC TROMETHAMINE 30 MG/ML IJ SOLN
30.0000 mg | Freq: Once | INTRAMUSCULAR | Status: AC
  Administered 2019-08-22: 30 mg via INTRAMUSCULAR

## 2019-08-22 MED ORDER — DICLOFENAC SODIUM 75 MG PO TBEC: 75.0000 mg | DELAYED_RELEASE_TABLET | Freq: Two times a day (BID) | ORAL | 0 refills | Status: DC

## 2019-08-22 NOTE — Patient Instructions (Addendum)
Acute Back Pain, Adult Acute back pain is sudden and usually short-lived. It is often caused by an injury to the muscles and tissues in the back. The injury may result from:  A muscle or ligament getting overstretched or torn (strained). Ligaments are tissues that connect bones to each other. Lifting something improperly can cause a back strain.  Wear and tear (degeneration) of the spinal disks. Spinal disks are circular tissue that provides cushioning between the bones of the spine (vertebrae).  Twisting motions, such as while playing sports or doing yard work.  A hit to the back.  Arthritis. You may have a physical exam, lab tests, and imaging tests to find the cause of your pain. Acute back pain usually goes away with rest and home care. Follow these instructions at home: Managing pain, stiffness, and swelling  Take over-the-counter and prescription medicines only as told by your health care provider.  Your health care provider may recommend applying ice during the first 24-48 hours after your pain starts. To do this: ? Put ice in a plastic bag. ? Place a towel between your skin and the bag. ? Leave the ice on for 20 minutes, 2-3 times a day.  If directed, apply heat to the affected area as often as told by your health care provider. Use the heat source that your health care provider recommends, such as a moist heat pack or a heating pad. ? Place a towel between your skin and the heat source. ? Leave the heat on for 20-30 minutes. ? Remove the heat if your skin turns bright red. This is especially important if you are unable to feel pain, heat, or cold. You have a greater risk of getting burned. Activity   Do not stay in bed. Staying in bed for more than 1-2 days can delay your recovery.  Sit up and stand up straight. Avoid leaning forward when you sit, or hunching over when you stand. ? If you work at a desk, sit close to it so you do not need to lean over. Keep your chin tucked  in. Keep your neck drawn back, and keep your elbows bent at a right angle. Your arms should look like the letter "L." ? Sit high and close to the steering wheel when you drive. Add lower back (lumbar) support to your car seat, if needed.  Take short walks on even surfaces as soon as you are able. Try to increase the length of time you walk each day.  Do not sit, drive, or stand in one place for more than 30 minutes at a time. Sitting or standing for long periods of time can put stress on your back.  Do not drive or use heavy machinery while taking prescription pain medicine.  Use proper lifting techniques. When you bend and lift, use positions that put less stress on your back: ? Bend your knees. ? Keep the load close to your body. ? Avoid twisting.  Exercise regularly as told by your health care provider. Exercising helps your back heal faster and helps prevent back injuries by keeping muscles strong and flexible.  Work with a physical therapist to make a safe exercise program, as recommended by your health care provider. Do any exercises as told by your physical therapist. Lifestyle  Maintain a healthy weight. Extra weight puts stress on your back and makes it difficult to have good posture.  Avoid activities or situations that make you feel anxious or stressed. Stress and anxiety increase muscle   tension and can make back pain worse. Learn ways to manage anxiety and stress, such as through exercise. General instructions  Sleep on a firm mattress in a comfortable position. Try lying on your side with your knees slightly bent. If you lie on your back, put a pillow under your knees.  Follow your treatment plan as told by your health care provider. This may include: ? Cognitive or behavioral therapy. ? Acupuncture or massage therapy. ? Meditation or yoga. Contact a health care provider if:  You have pain that is not relieved with rest or medicine.  You have increasing pain going down  into your legs or buttocks.  Your pain does not improve after 2 weeks.  You have pain at night.  You lose weight without trying.  You have a fever or chills. Get help right away if:  You develop new bowel or bladder control problems.  You have unusual weakness or numbness in your arms or legs.  You develop nausea or vomiting.  You develop abdominal pain.  You feel faint. Summary  Acute back pain is sudden and usually short-lived.  Use proper lifting techniques. When you bend and lift, use positions that put less stress on your back.  Take over-the-counter and prescription medicines and apply heat or ice as directed by your health care provider. This information is not intended to replace advice given to you by your health care provider. Make sure you discuss any questions you have with your health care provider. Document Revised: 07/25/2018 Document Reviewed: 11/17/2016 Elsevier Patient Education  2020 Else[employer] Inc[employer]  Back Exercises These exercises help to make your trunk and back strong. They also help to keep the lower back flexible. Doing these exercises can help to prevent back pain or lessen existing pain.  If you have back pain, try to do these exercises 2-3 times each day or as told by your doctor.  As you get better, do the exercises once each day. Repeat the exercises more often as told by your doctor.  To stop back pain from coming back, do the exercises once each day, or as told by your doctor. Exercises Single knee to chest Do these steps 3-5 times in a row for each leg: 1. Lie on your back on a firm bed or the floor with your legs stretched out. 2. Bring one knee to your chest. 3. Grab your knee or thigh with both hands and hold them it in place. 4. Pull on your knee until you feel a gentle stretch in your lower back or buttocks. 5. Keep doing the stretch for 10-30 seconds. 6. Slowly let go of your leg and straighten it. Pelvic tilt Do these steps 5-10  times in a row: 1. Lie on your back on a firm bed or the floor with your legs stretched out. 2. Bend your knees so they point up to the ceiling. Your feet should be flat on the floor. 3. Tighten your lower belly (abdomen) muscles to press your lower back against the floor. This will make your tailbone point up to the ceiling instead of pointing down to your feet or the floor. 4. Stay in this position for 5-10 seconds while you gently tighten your muscles and breathe evenly. Cat-cow Do these steps until your lower back bends more easily: 1. Get on your hands and knees on a firm surface. Keep your hands under your shoulders, and keep your knees under your hips. You may put padding under your knees. 2. Let   your head hang down toward your chest. Tighten (contract) the muscles in your belly. Point your tailbone toward the floor so your lower back becomes rounded like the back of a cat. 3. Stay in this position for 5 seconds. 4. Slowly lift your head. Let the muscles of your belly relax. Point your tailbone up toward the ceiling so your back forms a sagging arch like the back of a cow. 5. Stay in this position for 5 seconds.  Press-ups Do these steps 5-10 times in a row: 1. Lie on your belly (face-down) on the floor. 2. Place your hands near your head, about shoulder-width apart. 3. While you keep your back relaxed and keep your hips on the floor, slowly straighten your arms to raise the top half of your body and lift your shoulders. Do not use your back muscles. You may change where you place your hands in order to make yourself more comfortable. 4. Stay in this position for 5 seconds. 5. Slowly return to lying flat on the floor.  Bridges Do these steps 10 times in a row: 1. Lie on your back on a firm surface. 2. Bend your knees so they point up to the ceiling. Your feet should be flat on the floor. Your arms should be flat at your sides, next to your body. 3. Tighten your butt muscles and lift  your butt off the floor until your waist is almost as high as your knees. If you do not feel the muscles working in your butt and the back of your thighs, slide your feet 1-2 inches farther away from your butt. 4. Stay in this position for 3-5 seconds. 5. Slowly lower your butt to the floor, and let your butt muscles relax. If this exercise is too easy, try doing it with your arms crossed over your chest. Belly crunches Do these steps 5-10 times in a row: 1. Lie on your back on a firm bed or the floor with your legs stretched out. 2. Bend your knees so they point up to the ceiling. Your feet should be flat on the floor. 3. Cross your arms over your chest. 4. Tip your chin a little bit toward your chest but do not bend your neck. 5. Tighten your belly muscles and slowly raise your chest just enough to lift your shoulder blades a tiny bit off of the floor. Avoid raising your body higher than that, because it can put too much stress on your low back. 6. Slowly lower your chest and your head to the floor. Back lifts Do these steps 5-10 times in a row: 1. Lie on your belly (face-down) with your arms at your sides, and rest your forehead on the floor. 2. Tighten the muscles in your legs and your butt. 3. Slowly lift your chest off of the floor while you keep your hips on the floor. Keep the back of your head in line with the curve in your back. Look at the floor while you do this. 4. Stay in this position for 3-5 seconds. 5. Slowly lower your chest and your face to the floor. Contact a doctor if:  Your back pain gets a lot worse when you do an exercise.  Your back pain does not get better 2 hours after you exercise. If you have any of these problems, stop doing the exercises. Do not do them again unless your doctor says it is okay. Get help right away if:  You have sudden, very bad back pain.   If this happens, stop doing the exercises. Do not do them again unless your doctor says it is  okay. This information is not intended to replace advice given to you by your health care provider. Make sure you discuss any questions you have with your health care provider. Document Revised: 12/29/2017 Document Reviewed: 12/29/2017 Elsevier Patient Education  2020 Elsevier Inc.  

## 2019-08-22 NOTE — Assessment & Plan Note (Addendum)
Presentation significantly different from yesterday.  However no red flag symptoms and normal exam. -Ketorolac 30x1 -Diclofenac 75 mg twice daily -Continue home Flexeril gabapentin, physical therapy -Follow-up 2 weeks later as needed

## 2019-08-22 NOTE — Progress Notes (Signed)
    SUBJECTIVE:   CHIEF COMPLAINT / HPI:   BACK PAIN  Back pain began 1 days ago. Pain is described as sharp stabbing. Patient has tried ibuprofen, naproxen, Tylenol. Pain radiates down both sides. History of trauma or injury: No Patient believes might be causing their pain: Flare of her back pain, history of spinal stenosis Worse with back flexion, better with back extension Prior history of similar pain: Yes History of cancer: No Weak immune system: No History of IV drug use: No History of steroid use: Not chronically, recently had steroid burst but could not tolerate it due to stomach upset  Symptoms Incontinence of bowel or bladder: No Numbness of leg: No Fever: No Rest or Night pain: No Weight Loss: No Rash: No   ROS see HPI  OBJECTIVE:   BP 130/68   Pulse (!) 107   Ht 5\' 8"  (1.727 m)   Wt 246 lb 6 oz (111.8 kg)   SpO2 96%   BMI 37.46 kg/m    General: appears uncomfortable, but able to walk without assistance Lower back Inspection: No gross deformity or trauma Palpation: Nontender to palpation ROM: Limited flexion due to pain Strength: 5 of 5 strength in lower extremities bilaterally throughout Stability: Back is stable Special tests: Negative FADIR and FABER Vascular studies: N/A   ASSESSMENT/PLAN:   Back pain Presentation significantly different from yesterday.  However no red flag symptoms and normal exam. -Ketorolac 30x1 -Diclofenac 75 mg twice daily -Continue home Flexeril gabapentin, physical therapy -Follow-up 2 weeks later as needed     Bonnita Hollow, MD [place]

## 2019-08-28 ENCOUNTER — Ambulatory Visit: Payer: Medicare Other | Admitting: Neurology

## 2019-08-28 DIAGNOSIS — G4719 Other hypersomnia: Secondary | ICD-10-CM

## 2019-08-28 DIAGNOSIS — Z79891 Long term (current) use of opiate analgesic: Secondary | ICD-10-CM

## 2019-08-28 DIAGNOSIS — R0683 Snoring: Secondary | ICD-10-CM

## 2019-08-28 DIAGNOSIS — E669 Obesity, unspecified: Secondary | ICD-10-CM

## 2019-08-28 DIAGNOSIS — G478 Other sleep disorders: Secondary | ICD-10-CM

## 2019-08-28 DIAGNOSIS — R351 Nocturia: Secondary | ICD-10-CM

## 2019-09-04 ENCOUNTER — Telehealth: Payer: Self-pay

## 2019-09-04 NOTE — Telephone Encounter (Signed)
LVM for pt to call me back to schedule another home sleep study. Not enough recording time on 08/29/2019 and we need to repeat.

## 2019-09-09 ENCOUNTER — Other Ambulatory Visit: Payer: Self-pay | Admitting: Family Medicine

## 2019-09-09 DIAGNOSIS — M722 Plantar fascial fibromatosis: Secondary | ICD-10-CM

## 2019-09-11 ENCOUNTER — Ambulatory Visit: Payer: Medicare Other | Admitting: Physical Therapy

## 2019-09-18 ENCOUNTER — Other Ambulatory Visit: Payer: Self-pay

## 2019-09-18 ENCOUNTER — Ambulatory Visit: Payer: Medicare Other | Attending: Family Medicine | Admitting: Physical Therapy

## 2019-09-18 ENCOUNTER — Encounter: Payer: Self-pay | Admitting: Physical Therapy

## 2019-09-18 DIAGNOSIS — M25552 Pain in left hip: Secondary | ICD-10-CM | POA: Insufficient documentation

## 2019-09-18 DIAGNOSIS — R2689 Other abnormalities of gait and mobility: Secondary | ICD-10-CM | POA: Diagnosis not present

## 2019-09-18 DIAGNOSIS — M25551 Pain in right hip: Secondary | ICD-10-CM | POA: Diagnosis not present

## 2019-09-18 DIAGNOSIS — M6281 Muscle weakness (generalized): Secondary | ICD-10-CM | POA: Diagnosis not present

## 2019-09-18 DIAGNOSIS — R293 Abnormal posture: Secondary | ICD-10-CM | POA: Insufficient documentation

## 2019-09-18 NOTE — Patient Instructions (Signed)
Access Code: [email or web address] URL: [email or web address] Date: 09/18/2019 Prepared by: Estill Bamberg April Thurnell Garbe  Exercises Supine Bridge - 1 x daily - 7 x weekly - 10 reps - 3 sets Seated Piriformis Stretch - 1 x daily - 7 x weekly - 10 reps - 3 sets Standing 'L' Stretch at Counter - 1 x daily - 7 x weekly - 10 reps - 3 sets

## 2019-09-18 NOTE — Therapy (Signed)
[place], [place], [address] Phone: [phone]   Fax:  [phone]  Physical Therapy Evaluation  Patient Details  Name: Norma [name] MRN: [id number] Date of Birth: 1960-09-07 Referring Provider (PT): Andrena Mews, MD   Encounter Date: 09/18/2019  PT End of Session - 09/18/19 1359    Visit Number  1    Number of Visits  12    Date for PT Re-Evaluation  10/30/19    PT Start Time  [id number]    PT Stop Time  1500    PT Time Calculation (min)  55 min    Activity Tolerance  Patient tolerated treatment well    Behavior During Therapy  [facility] for tasks assessed/performed       Past Medical History:  Diagnosis Date  . Acid reflux   . Allergy   . Anemia   . Anxiety   . Arthritis   . Back pain   . Depression   . Diabetes mellitus without complication ([place])    on meds  . Headache    stress headaches, migraines at times  . Heart murmur   . Hypertension   . Irregular heart beat   . Left shoulder pain   . Shortness of breath dyspnea    with exertion  . Tachyarrhythmia   . Trigger point of thoracic region 03/22/2012    Past Surgical History:  Procedure Laterality Date  . ANKLE SURGERY    . KNEE SURGERY Left   . LUMBAR LAMINECTOMY/DECOMPRESSION MICRODISCECTOMY Left 06/06/2014   Procedure: LUMBAR LAMINECTOMY/DECOMPRESSION MICRODISCECTOMY 1 LEVEL;  Surgeon: Newman Pies, MD;  Location: [facility]URO ORS;  Service: Neurosurgery;  Laterality: Left;  Left L5S1 microdiskectomy  . ROTATOR CUFF REPAIR Left 02/20/14  . TUBAL LIGATION      There were no vitals filed for this visit.   Subjective Assessment - 09/18/19 1404    Subjective  Pt reports chronic hip pain but states she feels both hips are getting worse. She reports laying down is aggravating (hurts while side sleeping). She notes that walking hurts.  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 She states she feels it is 8/10 pain constantly and 5/10 at best.  10/10 today. Pt notes that only percocet seems to help with her pain.    Limitations  Walking;Sitting    How long can you walk comfortably?  10'; feels like she's hopping on it (L more than R)    Patient Stated Goals  Sleep without pain and walk without pain.    Currently in Pain?  Yes    Pain Score  10-Worst pain ever    Pain Location  Hip    Pain Orientation  Right;Left;Lateral    Pain Descriptors / Indicators  Dull;Aching    Pain Type  Chronic pain;Acute pain    Pain Onset  More than a month ago    Pain Frequency  Constant         [facility] PT Assessment - 09/18/19 0001      Assessment   Medical Diagnosis  M70.62 (ICD-10-CM) - Greater trochanteric bursitis, left    Referring Provider (PT)  Andrena Mews, MD    Next MD Visit  Sleep study 6/28    Prior Therapy  Low back pain or shoulder pain years ago      Balance Screen   Has the patient fallen in the past 6 months  No      [place]  Private residence    Living Arrangements  Parent    Type of [place] to enter    Entrance Stairs-Number of Steps  3    Entrance Stairs-Rails  None    Home Layout  One level      Prior Function   Level of Independence  Independent      Observation/Other Assessments   Focus on Therapeutic Outcomes (FOTO)   68%      AROM   Lumbar Flexion  trunk flexion mostly from hip/pelvis, limited ~50%    Lumbar Extension  Limited ~25%    Lumbar - Right Side Bend  Limited ~25% due to pain    Lumbar - Left Side Bend  WFL    Lumbar - Right Rotation  WFL    Lumbar - Left Rotation  [facility]      Strength   Overall Strength Comments  Bilat knee and ankle 5/5 grossly    Right Hip Flexion  4/5   Limited due to pain   Right Hip Extension  4/5    Right Hip ABduction  4-/5    Right Hip ADduction  5/5   in supine   Left Hip Flexion  4/5   limited due to pain   Left Hip Extension  3+/5    Left Hip ABduction  3+/5   with increased pain   Left Hip  ADduction  5/5   In supine     Flexibility   Hamstrings  ~60 deg bilaterally before pain/stretch      Palpation   Spinal mobility  Hypomobile thoracolumbar spine with PA mobs    SI assessment   TTP      Special Tests   Sacroiliac Tests   Sacral Thrust    Hip Special Tests   Ober's Test;Thomas Test      FABER test   findings  Positive    Side  LEft      Slump test   Findings  Negative      Straight Leg Raise   Findings  Negative    Comment  Pain in hip      Sacral thrust    Findings  Positive    Side  Left      Thomas Test    Findings  Negative      Ober's Test   Findings  Negative      Ambulation/Gait   Ambulation Distance (Feet)  50 Feet    Gait Pattern  Step-to pattern;Decreased step length - right;Decreased stance time - left;Decreased hip/knee flexion - left;Decreased weight shift to left;Antalgic    Gait Comments  Improved after manual therapy, exercises and e-stim to reciprocal gait                  Objective measurements completed on examination: See above findings.      [place] Adult PT Treatment/Exercise - 09/18/19 0001      Lumbar Exercises: Stretches   Piriformis Stretch  30 seconds;Right;Left   In sitting   Other Lumbar Stretch Exercise  "L" stretch      Lumbar Exercises: Supine   Bridge  10 reps;2 seconds   cues to maintain level pelvis     Modalities   Modalities  Electrical Stimulation      Electrical Stimulation   Electrical Stimulation Location  Bilateral lateral hip along glutes    Electrical Stimulation Action  premod    Electrical Stimulation Parameters  to pt  tolerance    Electrical Stimulation Goals  Tone;Pain      Manual Therapy   Manual Therapy  Soft tissue mobilization;Joint mobilization;Myofascial release;Muscle Energy Technique    Manual therapy comments  L hip distraction    Soft tissue mobilization  Piriformis, glutes    Muscle Energy Technique  "Shotgun" technique             PT Education - 09/18/19  1522    Education Details  Discussed HEP, stretching and strengthening for her hip and back/core stability.    Person(s) Educated  Patient    Methods  Explanation;Verbal cues;Handout;Tactile cues    Comprehension  Verbalized understanding;Returned demonstration;Tactile cues required       PT Short Term Goals - 09/18/19 1524      PT SHORT TERM GOAL #1   Title  Pt will report pain to <6/10 pain constantly    Baseline  8/10 constantly    Time  3    Period  Weeks    Status  New    Target Date  10/09/19      PT SHORT TERM GOAL #2   Title  Pt will demonstrate normal gait pattern    Baseline  Antalgic gait, decreased L LE stance phase, L hip hike    Time  3    Period  Weeks    Status  New    Target Date  10/09/19        PT Long Term Goals - 09/18/19 1524      PT LONG TERM GOAL #1   Title  Pt will be independent with HEP    Time  6    Period  Weeks    Status  New    Target Date  10/30/19      PT LONG TERM GOAL #2   Title  Pt will improve bilat hip strength to 5/5 in all directions    Baseline  3+/5 left hip abduction, 3+/5 left hip extension    Time  6    Period  Weeks    Status  New    Target Date  10/30/19      PT LONG TERM GOAL #3   Title  Pt will report pain as </=4/10 for improved activity tolerance    Baseline  8/10 constantly, 5/10 at best    Time  6    Period  Weeks    Status  New    Target Date  10/30/19      PT LONG TERM GOAL #4   Title  Pt will be able to sleep without pain for sleep study    Baseline  Unable to tolerate sleep long enough for sleep study    Time  6    Period  Weeks    Status  New    Target Date  10/30/19      PT LONG TERM GOAL #5   Title  Pt will improve FOTO score to </=54% limitation    Baseline  68% limited    Time  6    Period  Weeks    Status  New    Target Date  10/30/19             Plan - 09/18/19 1458    Clinical Impression Statement  Pt is a 59 y/o F presenting to clinic with c/o bilateral hip pain (L worse  than R). Pt demonstrates multiple trigger points and TTP along glutes, piriformis, and SI joint with hypomobile thoracolumbar  spine, decreased hip strength and SI dysfunction, pain, and poor gait pattern. Pt would benefit from therapy to address these issues. Pt reports decrease of 10/10 pain to 5/10 pain after PT session.    Personal Factors and Comorbidities  Age;Fitness;Comorbidity 1    Comorbidities  arthritis, anxiety, depression, chronic pain    Examination-Activity Limitations  Bathing;Locomotion Level;Bend;Sit;Sleep;Stairs;Lift;Stand    Examination-Participation Restrictions  Church;Community Activity;Laundry;Yard Work;Other    Stability/Clinical Decision Making  Evolving/Moderate complexity    Clinical Decision Making  Moderate    Rehab Potential  Good    PT Frequency  2x / week   Ideally 2x/wk for 6 weeks; however, pending on insurance pt may only afford 1x/wk   PT Duration  6 weeks    PT Treatment/Interventions  ADLs/Self Care Home Management;Electrical Stimulation;Cryotherapy;Iontophoresis 4mg /ml Dexamethasone;Moist Heat;Ultrasound;Gait training;Stair training;Functional mobility training;Therapeutic activities;Therapeutic exercise;Balance training;Neuromuscular re-education;Patient/family education;Manual techniques;Passive range of motion;Dry needling;Taping    PT Next Visit Plan  Progress core and hip strengthening exercises. Manual therapy as needed. Consider e-stim and dry needling.    PT Home Exercise Plan  Access Code: [email or web address]    Consulted and Agree with Plan of Care  Patient       Patient will benefit from skilled therapeutic intervention in order to improve the following deficits and impairments:  Abnormal gait, Decreased range of motion, Difficulty walking, Increased fascial restricitons, Pain, Hypomobility, Impaired flexibility, Improper body mechanics, Decreased mobility, Decreased strength, Increased edema  Visit Diagnosis: Pain in left hip  Pain in right  hip  Muscle weakness (generalized)  Abnormal posture  Other abnormalities of gait and mobility     Problem List Patient Active Problem List   Diagnosis Date Noted  . TMJ (temporomandibular joint disorder) 07/31/2019  . Fatigue 07/05/2019  . Diaphoresis 05/01/2019  . Cough with exposure to COVID-19 virus 04/27/2019  . Lab test positive for detection of COVID-19 virus 04/10/2019  . Acute laryngitis 04/10/2019  . Greater trochanteric bursitis, left 02/27/2019  . Pain of left heel 02/27/2019  . Ear pain, right 02/26/2019  . Bilateral hearing loss 01/23/2019  . Uterine mass 12/19/2018  . Allergic rhinitis with postnasal drip 11/17/2018  . Diverticulosis of colon without hemorrhage 06/23/2018  . Arthritis of finger of right hand 06/23/2018  . Intractable migraine without aura and without status migrainosus 02/16/2018  . Breast asymmetry 11/30/2017  . Plantar fasciitis of right foot 07/06/2017  . Iron deficiency anemia 03/29/2017  . Vitamin D deficiency 03/01/2017  . Cough 01/11/2017  . Environmental allergies 11/03/2016  . Flank pain, chronic 08/27/2014  . Other cyst of bone, right ankle and foot 06/21/2014  . Lumbar herniated disc 06/06/2014  . Depression   . Prediabetes 01/14/2014  . Spinal stenosis, lumbar region, with neurogenic claudication 11/21/2013  . Back pain 06/06/2013  . Left knee pain 01/10/2013  . Decreased hearing 12/29/2011  . Hot flashes 06/30/2011  . Eczema, dyshidrotic 06/02/2010  . Chronic pain of right ankle 05/06/2010  . FIBROIDS, UTERUS 10/24/2009  . GERD 06/09/2009  . Chronic pain syndrome 10/04/2008  . HIP PAIN, LEFT, CHRONIC 08/16/2007  . Hepatitis C carrier ([place]) 03/21/2007  . Obesity 06/16/2006  . HYPERTENSION, BENIGN SYSTEMIC 06/16/2006    [facility] April Ma L Niel Peretti PT, DPT 09/18/2019, 3:59 PM  [facility] [address] [place], [place], [address] Phone: [phone]   Fax:   [phone]  Name: Norma [name] MRN: [id number] Date of Birth: [birth year]

## 2019-09-21 DIAGNOSIS — Z1159 Encounter for screening for other viral diseases: Secondary | ICD-10-CM | POA: Diagnosis not present

## 2019-09-21 DIAGNOSIS — G8929 Other chronic pain: Secondary | ICD-10-CM | POA: Diagnosis not present

## 2019-09-21 DIAGNOSIS — E7800 Pure hypercholesterolemia, unspecified: Secondary | ICD-10-CM | POA: Diagnosis not present

## 2019-09-21 DIAGNOSIS — M5442 Lumbago with sciatica, left side: Secondary | ICD-10-CM | POA: Diagnosis not present

## 2019-09-21 DIAGNOSIS — I10 Essential (primary) hypertension: Secondary | ICD-10-CM | POA: Diagnosis not present

## 2019-09-21 DIAGNOSIS — Z79899 Other long term (current) drug therapy: Secondary | ICD-10-CM | POA: Diagnosis not present

## 2019-09-21 DIAGNOSIS — E119 Type 2 diabetes mellitus without complications: Secondary | ICD-10-CM | POA: Diagnosis not present

## 2019-10-01 ENCOUNTER — Ambulatory Visit: Payer: Medicare Other | Admitting: Physical Therapy

## 2019-10-04 ENCOUNTER — Ambulatory Visit: Payer: Medicare Other | Admitting: Physical Therapy

## 2019-10-04 ENCOUNTER — Other Ambulatory Visit: Payer: Self-pay | Admitting: Family Medicine

## 2019-10-04 ENCOUNTER — Other Ambulatory Visit: Payer: Self-pay

## 2019-10-04 DIAGNOSIS — M6281 Muscle weakness (generalized): Secondary | ICD-10-CM | POA: Diagnosis not present

## 2019-10-04 DIAGNOSIS — M25551 Pain in right hip: Secondary | ICD-10-CM | POA: Diagnosis not present

## 2019-10-04 DIAGNOSIS — R2689 Other abnormalities of gait and mobility: Secondary | ICD-10-CM

## 2019-10-04 DIAGNOSIS — M25552 Pain in left hip: Secondary | ICD-10-CM | POA: Diagnosis not present

## 2019-10-04 DIAGNOSIS — R293 Abnormal posture: Secondary | ICD-10-CM | POA: Diagnosis not present

## 2019-10-04 NOTE — Therapy (Signed)
[place], [place], [address] Phone: [phone]   Fax:  [phone]  Physical Therapy Treatment  Patient Details  Name: Norma [name] MRN: [id number] Date of Birth: 08/05/1960 Referring Provider (PT): Andrena Mews, MD   Encounter Date: 10/04/2019   PT End of Session - 10/04/19 1450    Visit Number 2    Number of Visits 12    Date for PT Re-Evaluation 10/30/19    PT Start Time 1400    PT Stop Time 1447    PT Time Calculation (min) 47 min    Activity Tolerance Patient tolerated treatment well    Behavior During Therapy [facility] for tasks assessed/performed           Past Medical History:  Diagnosis Date  . Acid reflux   . Allergy   . Anemia   . Anxiety   . Arthritis   . Back pain   . Depression   . Diabetes mellitus without complication ([place])    on meds  . Headache    stress headaches, migraines at times  . Heart murmur   . Hypertension   . Irregular heart beat   . Left shoulder pain   . Shortness of breath dyspnea    with exertion  . Tachyarrhythmia   . Trigger point of thoracic region 03/22/2012    Past Surgical History:  Procedure Laterality Date  . ANKLE SURGERY    . KNEE SURGERY Left   . LUMBAR LAMINECTOMY/DECOMPRESSION MICRODISCECTOMY Left 06/06/2014   Procedure: LUMBAR LAMINECTOMY/DECOMPRESSION MICRODISCECTOMY 1 LEVEL;  Surgeon: Newman Pies, MD;  Location: [place] [facility];  Service: Neurosurgery;  Laterality: Left;  Left L5S1 microdiskectomy  . ROTATOR CUFF REPAIR Left 02/20/14  . TUBAL LIGATION      There were no vitals filed for this visit.   Subjective Assessment - 10/04/19 1406    Subjective Pt reports continued pain especially at night. Pt states that due to pain she has not been able to perfrom the exercises but has tried the stretches.    Limitations Walking;Sitting    How long can you walk comfortably? 10'; feels like she's hopping on it (L more than R)    Patient  Stated Goals Sleep without pain and walk without pain.    Currently in Pain? Yes    Pain Score 5     Pain Location Hip    Pain Onset More than a month ago                             [facility] Adult PT Treatment/Exercise - 10/04/19 0001      Lumbar Exercises: Stretches   Passive Hamstring Stretch 2 reps;30 seconds;Right;Left    Figure 4 Stretch 30 seconds;Supine      Lumbar Exercises: Aerobic   UBE (Upper Arm Bike) --    Nustep L3 x 5 min      Lumbar Exercises: Supine   Clam 2 seconds;20 reps    Bridge 10 reps      Knee/Hip Exercises: Sidelying   Hip ABduction Strengthening;10 reps      Acupuncturist Location L greater trochanter    Electrical Stimulation Action IFC    Electrical Stimulation Parameters to pt tolerance    Electrical Stimulation Goals Pain      Manual Therapy   Muscle Energy Technique Encouraging L posterior inominate rotation; "shotgun" technique  PT Education - 10/04/19 1453    Education Details Pt asking about if further imaging is needed for her hip -- discussed with pt that if she believes she has a bone issue an x-ray would typically pick that up; however, if there were concerns of tissue damage then CT or MRI would be more appropriate. PT did discuss that pt would still benefit from therapy irregardless due to her bilat LE weakness. Educated pt on performing SI METs for herself.    Person(s) Educated Patient    Methods Explanation;Verbal cues;Tactile cues;Handout    Comprehension Verbalized understanding;Returned demonstration;Tactile cues required            PT Short Term Goals - 09/18/19 1524      PT SHORT TERM GOAL #1   Title Pt will report pain to <6/10 pain constantly    Baseline 8/10 constantly    Time 3    Period Weeks    Status New    Target Date 10/09/19      PT SHORT TERM GOAL #2   Title Pt will demonstrate normal gait pattern    Baseline Antalgic gait,  decreased L LE stance phase, L hip hike    Time 3    Period Weeks    Status New    Target Date 10/09/19             PT Long Term Goals - 09/18/19 1524      PT LONG TERM GOAL #1   Title Pt will be independent with HEP    Time 6    Period Weeks    Status New    Target Date 10/30/19      PT LONG TERM GOAL #2   Title Pt will improve bilat hip strength to 5/5 in all directions    Baseline 3+/5 left hip abduction, 3+/5 left hip extension    Time 6    Period Weeks    Status New    Target Date 10/30/19      PT LONG TERM GOAL #3   Title Pt will report pain as </=4/10 for improved activity tolerance    Baseline 8/10 constantly, 5/10 at best    Time 6    Period Weeks    Status New    Target Date 10/30/19      PT LONG TERM GOAL #4   Title Pt will be able to sleep without pain for sleep study    Baseline Unable to tolerate sleep long enough for sleep study    Time 6    Period Weeks    Status New    Target Date 10/30/19      PT LONG TERM GOAL #5   Title Pt will improve FOTO score to </=54% limitation    Baseline 68% limited    Time 6    Period Weeks    Status New    Target Date 10/30/19                 Plan - 10/04/19 1450    Clinical Impression Statement Pt returns to clinic with less pain; however reports continued difficulty with sleeping and performing HEP due to pain. Treatment focused on gentle hip stretching, initiating hip strengthening and SI MET techniques. Pt with good response encouraging L inominate posterior rotation reporting 0/10 pain in this position.    Personal Factors and Comorbidities Age;Fitness;Comorbidity 1    Comorbidities arthritis, anxiety, depression, chronic pain    Examination-Activity Limitations Bathing;Locomotion Level;Bend;Sit;Sleep;Stairs;Lift;Stand  Examination-Participation Restrictions Church;Community Activity;Laundry;Yard Work;Other    Stability/Clinical Decision Making Evolving/Moderate complexity    Clinical Decision  Making Moderate    Rehab Potential Good    PT Frequency 2x / week   Ideally 2x/wk for 6 weeks; however, pending on insurance pt may only afford 1x/wk   PT Duration 6 weeks    PT Treatment/Interventions ADLs/Self Care Home Management;Electrical Stimulation;Cryotherapy;Iontophoresis 28m/ml Dexamethasone;Moist Heat;Ultrasound;Gait training;Stair training;Functional mobility training;Therapeutic activities;Therapeutic exercise;Balance training;Neuromuscular re-education;Patient/family education;Manual techniques;Passive range of motion;Dry needling;Taping    PT Next Visit Plan Progress core and hip/SI strengthening exercises. Perhaps perform more manual therapy as needed. Consider taping, e-stim and/or ionto.    PT Home Exercise Plan Access Code: [email or web address]    Consulted and Agree with Plan of Care Patient           Patient will benefit from skilled therapeutic intervention in order to improve the following deficits and impairments:  Abnormal gait, Decreased range of motion, Difficulty walking, Increased fascial restricitons, Pain, Hypomobility, Impaired flexibility, Improper body mechanics, Decreased mobility, Decreased strength, Increased edema  Visit Diagnosis: Pain in left hip  Pain in right hip  Muscle weakness (generalized)  Other abnormalities of gait and mobility     Problem List Patient Active Problem List   Diagnosis Date Noted  . TMJ (temporomandibular joint disorder) 07/31/2019  . Fatigue 07/05/2019  . Diaphoresis 05/01/2019  . Cough with exposure to COVID-19 virus 04/27/2019  . Lab test positive for detection of COVID-19 virus 04/10/2019  . Acute laryngitis 04/10/2019  . Greater trochanteric bursitis, left 02/27/2019  . Pain of left heel 02/27/2019  . Ear pain, right 02/26/2019  . Bilateral hearing loss 01/23/2019  . Uterine mass 12/19/2018  . Allergic rhinitis with postnasal drip 11/17/2018  . Diverticulosis of colon without hemorrhage 06/23/2018  . Arthritis of  finger of right hand 06/23/2018  . Intractable migraine without aura and without status migrainosus 02/16/2018  . Breast asymmetry 11/30/2017  . Plantar fasciitis of right foot 07/06/2017  . Iron deficiency anemia 03/29/2017  . Vitamin D deficiency 03/01/2017  . Cough 01/11/2017  . Environmental allergies 11/03/2016  . Flank pain, chronic 08/27/2014  . Other cyst of bone, right ankle and foot 06/21/2014  . Lumbar herniated disc 06/06/2014  . Depression   . Prediabetes 01/14/2014  . Spinal stenosis, lumbar region, with neurogenic claudication 11/21/2013  . Back pain 06/06/2013  . Left knee pain 01/10/2013  . Decreased hearing 12/29/2011  . Hot flashes 06/30/2011  . Eczema, dyshidrotic 06/02/2010  . Chronic pain of right ankle 05/06/2010  . FIBROIDS, UTERUS 10/24/2009  . GERD 06/09/2009  . Chronic pain syndrome 10/04/2008  . HIP PAIN, LEFT, CHRONIC 08/16/2007  . [place]atitis C [place] ([place] 03/21/2007  . Obesity 06/16/2006  . HYPERTENSION, BENIGN SYSTEMIC 06/16/2006    Jaedan Huttner April MGordy Levan6/17/2021, 3:01 PM  [facility] [address]Phone: [phone]  Fax:  [phone] Name: Norma HAUCKMRN: [id number] of Birth: [birth year]

## 2019-10-08 ENCOUNTER — Ambulatory Visit: Payer: Medicare Other | Admitting: Family Medicine

## 2019-10-08 ENCOUNTER — Ambulatory Visit: Payer: Medicare Other | Admitting: Physical Therapy

## 2019-10-09 ENCOUNTER — Other Ambulatory Visit: Payer: Self-pay

## 2019-10-09 ENCOUNTER — Ambulatory Visit (INDEPENDENT_AMBULATORY_CARE_PROVIDER_SITE_OTHER): Payer: Medicare Other | Admitting: Family Medicine

## 2019-10-09 ENCOUNTER — Encounter: Payer: Self-pay | Admitting: Family Medicine

## 2019-10-09 DIAGNOSIS — M722 Plantar fascial fibromatosis: Secondary | ICD-10-CM | POA: Diagnosis not present

## 2019-10-09 DIAGNOSIS — M79641 Pain in right hand: Secondary | ICD-10-CM

## 2019-10-09 DIAGNOSIS — M79642 Pain in left hand: Secondary | ICD-10-CM | POA: Diagnosis not present

## 2019-10-09 MED ORDER — DICLOFENAC SODIUM 1 % EX GEL: CUTANEOUS | 1 refills | Status: AC

## 2019-10-09 MED ORDER — LIDOCAINE-PRILOCAINE 2.5-2.5 % EX CREA: 1.0000 "application " | TOPICAL_CREAM | CUTANEOUS | 0 refills | Status: DC | PRN

## 2019-10-09 MED ORDER — NAPROXEN 500 MG PO TABS: 500.0000 mg | ORAL_TABLET | Freq: Two times a day (BID) | ORAL | 0 refills | Status: DC

## 2019-10-09 NOTE — Patient Instructions (Signed)
It was great to meet you today! Thank you for letting me participate in your care!  Today, we discussed your hand swelling and pain which is most likely due to osteoarthritis. However, I will get x-rays and some blood work to help rule out other potential causes.  If anything is abnormal I will call you. In the meantime, please use the Tylenol, Naproxen, Voltaren gel, and lidocaine gel as needed and continue with your hand exercises.  Please follow up with your PCP in 4 weeks.  Be well, Harolyn Rutherford, DO PGY-3, [facility]

## 2019-10-09 NOTE — Progress Notes (Signed)
SUBJECTIVE:   CHIEF COMPLAINT / HPI:   Bilateral Hand Pain Patient present with acute on chronic bilateral hand pain. She states she has had this intermittently for over 6 years but in the past week the pain and frequency have increased. She also endorses hand swelling that comes and goes but not present today. She uses topical lidocaine cream and tylenol which helps. She also uses Voltaren gel that tends to help as well. She has no pattern to the pain and feels like it occurs at random. Gets worse with movement and use. She is not swollen or stiff in the mornings in her hands. The pain is mainly in her thumb and knuckles. She denies any skin rashes or muscle weakness.  PERTINENT  PMH / PSH: HTN, GERD, Eczema, TMJ, Back pain  OBJECTIVE:   BP 140/80   Pulse 62   Wt 245 lb 3.2 oz (111.2 kg)   SpO2 98%   BMI 37.28 kg/m   Gen: NAD MSK: Bilateral Hands: No obvious bony deformity, no rashes, ecchymosis, no ulnar deviation of the digits. 5/5 grip and digit strength, gross sensation intact, full ROM in all digits and wrists.  ASSESSMENT/PLAN:   Bilateral hand pain Differential includes OA vs RA vs some other less likely rheumatological disease process such as SLE vs dermatomyositis vs Sjogren's syndrome. SLE and dermatomyositis unlikely as patient has no rash, no previous abnormal labs, no pleural effusions, no rashes, and no proximal muscle weakness. RA considered but labs were mostly normal, only very slightly positive RF factor but with all other labs normal she does not meet criteria for RA (ESR and CRP normal).  I suspect OA may be the cause given location and it gets worse with use. - X-rays of the hand still pending - Voltaren gel, Naproxen 582m BID, and cont using lidocaine cream as needed     TNuala Alpha DPound

## 2019-10-10 LAB — SEDIMENTATION RATE: Sed Rate: 29 mm/hr (ref 0–40)

## 2019-10-10 LAB — RHEUMATOID FACTOR: Rheumatoid fact SerPl-aCnc: 14.2 IU/mL — ABNORMAL HIGH (ref 0.0–13.9)

## 2019-10-10 LAB — C-REACTIVE PROTEIN: CRP: 2 mg/L (ref 0–10)

## 2019-10-11 ENCOUNTER — Ambulatory Visit: Payer: Medicare Other | Admitting: Physical Therapy

## 2019-10-11 LAB — ANA,IFA RA DIAG PNL W/RFLX TIT/PATN
ANA Titer 1: NEGATIVE
Cyclic Citrullin Peptide Ab: 4 units (ref 0–19)
Rheumatoid fact SerPl-aCnc: 14.5 IU/mL — ABNORMAL HIGH (ref 0.0–13.9)

## 2019-10-11 NOTE — Assessment & Plan Note (Signed)
Differential includes OA vs RA vs some other less likely rheumatological disease process such as SLE vs dermatomyositis vs Sjogren's syndrome. SLE and dermatomyositis unlikely as patient has no rash, no previous abnormal labs, no pleural effusions, no rashes, and no proximal muscle weakness. RA considered but labs were mostly normal, only very slightly positive RF factor but with all other labs normal she does not meet criteria for RA (ESR and CRP normal).  I suspect OA may be the cause given location and it gets worse with use. - X-rays of the hand still pending - Voltaren gel, Naproxen 566m BID, and cont using lidocaine cream as needed

## 2019-10-12 ENCOUNTER — Other Ambulatory Visit: Payer: Self-pay

## 2019-10-12 ENCOUNTER — Ambulatory Visit
Admission: RE | Admit: 2019-10-12 | Discharge: 2019-10-12 | Disposition: A | Discharge status: Home / Self Care | Payer: Medicare Other | Source: Ambulatory Visit | Attending: Family Medicine | Admitting: Family Medicine

## 2019-10-12 DIAGNOSIS — M19042 Primary osteoarthritis, left hand: Secondary | ICD-10-CM | POA: Diagnosis not present

## 2019-10-12 DIAGNOSIS — M19041 Primary osteoarthritis, right hand: Secondary | ICD-10-CM | POA: Diagnosis not present

## 2019-10-12 DIAGNOSIS — M79641 Pain in right hand: Secondary | ICD-10-CM

## 2019-10-13 LAB — ANTI-CCP AB, IGG + IGA (RDL): Anti-CCP Ab, IgG + IgA (RDL): <20 Units (ref <20)

## 2019-10-15 ENCOUNTER — Encounter: Payer: Self-pay | Admitting: Family Medicine

## 2019-10-15 ENCOUNTER — Ambulatory Visit
Admission: RE | Admit: 2019-10-15 | Discharge: 2019-10-15 | Disposition: A | Discharge status: Home / Self Care | Payer: Medicare Other | Source: Ambulatory Visit | Attending: Family Medicine | Admitting: Family Medicine

## 2019-10-15 ENCOUNTER — Ambulatory Visit (INDEPENDENT_AMBULATORY_CARE_PROVIDER_SITE_OTHER): Payer: Medicare Other | Admitting: Family Medicine

## 2019-10-15 ENCOUNTER — Other Ambulatory Visit: Payer: Self-pay

## 2019-10-15 ENCOUNTER — Other Ambulatory Visit: Payer: Self-pay | Admitting: Family Medicine

## 2019-10-15 ENCOUNTER — Ambulatory Visit: Payer: Medicare Other

## 2019-10-15 ENCOUNTER — Telehealth: Payer: Self-pay

## 2019-10-15 VITALS — BP 130/80 | HR 70 | Ht 68.0 in | Wt 244.2 lb

## 2019-10-15 DIAGNOSIS — J309 Allergic rhinitis, unspecified: Secondary | ICD-10-CM

## 2019-10-15 DIAGNOSIS — M25559 Pain in unspecified hip: Secondary | ICD-10-CM

## 2019-10-15 DIAGNOSIS — M25551 Pain in right hip: Secondary | ICD-10-CM | POA: Diagnosis not present

## 2019-10-15 MED ORDER — FLUTICASONE PROPIONATE 50 MCG/ACT NA SUSP: 2.0000 | Freq: Every day | NASAL | 6 refills | Status: DC

## 2019-10-15 MED ORDER — TRAMADOL HCL 50 MG PO TABS: 100.0000 mg | ORAL_TABLET | Freq: Four times a day (QID) | ORAL | 0 refills | Status: AC | PRN

## 2019-10-15 MED ORDER — KETOROLAC TROMETHAMINE 30 MG/ML IJ SOLN
30.0000 mg | Freq: Once | INTRAMUSCULAR | Status: AC
  Administered 2019-10-15: 30 mg via INTRAMUSCULAR

## 2019-10-15 NOTE — Telephone Encounter (Signed)
Rx sent 

## 2019-10-15 NOTE — Patient Instructions (Addendum)
It was great to see you today! Thank you for letting me participate in your care!  Today, we discussed your right sided hip and back pain. It is not quite clear what the cause is but I have ordered some x-rays today. In the meantime, I have given you a shot of a pain medication in clinic that I hope will help. You can continue using Tylenol, heating pads, ice, and OTC creams to help with the pain.  Be well, Norma Rutherford, DO PGY-3, [facility]

## 2019-10-15 NOTE — Telephone Encounter (Signed)
Refill request sent for Fluticasone. Did not see on current med list:  Fluticasone Prop 50 Refills: 6 Spray 2 sprays into each nostril every day.  Ottis Stain, CMA

## 2019-10-15 NOTE — Progress Notes (Signed)
Requesting flonase refill

## 2019-10-15 NOTE — Progress Notes (Addendum)
    SUBJECTIVE:   CHIEF COMPLAINT / HPI:   Right Hip/Back Pain Patient presents today with new onset pain that started on Sunday morning with no known inciting event. The pain is located in her upper hip area and made worse with movement. She has never had anything like this before. Pain feels sharp, on the right side, radiating to the back. Constant rated at a 10/10. She has tried a heating pad, muscle relaxer, and resting but nothing seems to help. No numbness, tingling, no loss of feeling. No fevers, chills, nausea, vomiting, no urinary symptoms, no blood in the toilet after using the bathroom.  PERTINENT  PMH / PSH: HTN, GERD, Hx of lumbar herniated disc, Hx of back pain  OBJECTIVE:   BP 130/80   Pulse 70   Ht 5\' 8"  (1.727 m)   Wt 244 lb 3.2 oz (110.8 kg)   SpO2 97%   BMI 37.13 kg/m   Gen: NAD MSK: Hip, Right: TTP noted at right flank and right ischium. No obvious rash, erythema, ecchymosis, or edema. ROM full in all directions (IR: 80/ ER: 80/Flex: 120/Ext: 100/Abd: 45/Add: 45); Strength 5/5 in IR/ER/Flex/Ext/Abd/Add. Pelvic alignment unremarkable to inspection and palpation. Standing hip rotation and gait without trendelenburg / unsteadiness. Greater trochanter without tenderness to palpation. No tenderness over piriformis. No SI joint tenderness and normal minimal SI movement.   ASSESSMENT/PLAN:   Hip pain Unclear etiology of her pain and it does not fit any typical pathology or pattern. Does not mimic OA although she does have OA of the hands. Could be SI joint OA but the location is somewhat non-traditional for that. No groin pain to suggest anatomical hip OA. No pain on palpation on the lateral greater trocanter to suggest hip bursitis - Ketrolac - DG right hip and pelvis x-ray  - I did discuss with her she is on chronic opiates and given her level of pain that is limiting her ability to walk in the house I gave her a 5 day supply of tramadol and instructed her to reach  out to hear pain clinic. I did discuss that this would be a one time dose of tramadol just to allow her to be able to get home and do ADLs. She expressed understanding and knows in the future any pain medication request will go to her pain clinic.     Norma [name], [place]

## 2019-10-16 ENCOUNTER — Ambulatory Visit: Payer: Medicare Other | Admitting: Physical Therapy

## 2019-10-16 ENCOUNTER — Telehealth: Payer: Self-pay | Admitting: Family Medicine

## 2019-10-16 NOTE — Assessment & Plan Note (Addendum)
Unclear etiology of her pain and it does not fit any typical pathology or pattern. Does not mimic OA although she does have OA of the hands. Could be SI joint OA but the location is somewhat non-traditional for that. No groin pain to suggest anatomical hip OA. No pain on palpation on the lateral greater trocanter to suggest hip bursitis - Ketrolac - DG right hip and pelvis x-ray  - I did discuss with her she is on chronic opiates and given her level of pain that is limiting her ability to walk in the house I gave her a 5 day supply of tramadol and instructed her to reach out to [facility] pain clinic. I did discuss that this would be a one time dose of tramadol just to allow her to be able to get home and do ADLs. She expressed understanding and knows in the future any pain medication request will go to her pain clinic.

## 2019-10-16 NOTE — Telephone Encounter (Signed)
Patient calling for her results from her x-ray from yesterday. Routing to Dr. Garlan Fillers who saw patient.

## 2019-10-17 NOTE — Telephone Encounter (Signed)
Called patient.  Advised of negative XR.  Reiterated Dr. Arlana Pouch plan from visit and advised if no improvement in the next week, to make an appointment for follow up.

## 2019-10-18 ENCOUNTER — Other Ambulatory Visit: Payer: Self-pay

## 2019-10-18 ENCOUNTER — Ambulatory Visit: Payer: Medicare Other | Attending: Family Medicine | Admitting: Physical Therapy

## 2019-10-18 DIAGNOSIS — M25552 Pain in left hip: Secondary | ICD-10-CM | POA: Diagnosis not present

## 2019-10-18 DIAGNOSIS — R2689 Other abnormalities of gait and mobility: Secondary | ICD-10-CM | POA: Diagnosis not present

## 2019-10-18 DIAGNOSIS — M6281 Muscle weakness (generalized): Secondary | ICD-10-CM | POA: Insufficient documentation

## 2019-10-18 DIAGNOSIS — M25551 Pain in right hip: Secondary | ICD-10-CM | POA: Insufficient documentation

## 2019-10-18 DIAGNOSIS — R293 Abnormal posture: Secondary | ICD-10-CM | POA: Diagnosis not present

## 2019-10-18 DIAGNOSIS — T8484XA Pain due to internal orthopedic prosthetic devices, implants and grafts, initial encounter: Secondary | ICD-10-CM | POA: Diagnosis not present

## 2019-10-18 NOTE — Therapy (Signed)
[facility] [place], [place], [address] Phone: [phone]   Fax:  [phone]  Physical Therapy Treatment  Patient Details  Name: Norma [name] MRN: [id number] Date of Birth: [birth year] Referring Provider (PT): Andrena Mews, MD   Encounter Date: 10/18/2019   PT End of Session - 10/18/19 1449    Visit Number 3    Number of Visits 12    Date for PT Re-Evaluation 10/30/19    PT Start Time 9798    PT Stop Time 1446    PT Time Calculation (min) 38 min    Activity Tolerance Patient tolerated treatment well    Behavior During Therapy [facility] for tasks assessed/performed           Past Medical History:  Diagnosis Date  . Acid reflux   . Allergy   . Anemia   . Anxiety   . Arthritis   . Back pain   . Depression   . Diabetes mellitus without complication ([place])    on meds  . Headache    stress headaches, migraines at times  . Heart murmur   . Hypertension   . Irregular heart beat   . Left shoulder pain   . Shortness of breath dyspnea    with exertion  . Tachyarrhythmia   . Trigger point of thoracic region 03/22/2012    Past Surgical History:  Procedure Laterality Date  . ANKLE SURGERY    . KNEE SURGERY Left   . LUMBAR LAMINECTOMY/DECOMPRESSION MICRODISCECTOMY Left 06/06/2014   Procedure: LUMBAR LAMINECTOMY/DECOMPRESSION MICRODISCECTOMY 1 LEVEL;  Surgeon: Newman Pies, MD;  Location: [place] [facility];  Service: Neurosurgery;  Laterality: Left;  Left L5S1 microdiskectomy  . ROTATOR CUFF REPAIR Left 02/20/14  . TUBAL LIGATION      There were no vitals filed for this visit.   Subjective Assessment - 10/18/19 1412    Subjective Pt reports she had been in so much pain last week she was unable to try the stretches to see if it relieved the pain. Pt saw her PCP who provided her pain medication but she states it didn't seem to help. She notes she got her hip and back x-ray but there was nothing acute.    Currently  in Pain? Yes    Pain Score 6     Pain Location Buttocks    Pain Orientation Right;Left    Pain Descriptors / Indicators Tightness;Crushing                             [facility] Adult PT Treatment/Exercise - 10/18/19 0001      Lumbar Exercises: Aerobic   Nustep L5 x 5 min      Knee/Hip Exercises: Stretches   Hip Flexor Stretch Both;2 reps;30 seconds    Piriformis Stretch Both;30 seconds;2 reps      Knee/Hip Exercises: Standing   Hip Abduction Stengthening;Both;2 sets;10 reps    Hip Extension Stengthening;Both;2 sets;10 reps      Manual Therapy   Manual Therapy Soft tissue mobilization    Soft tissue mobilization bilat piriformis, glute medius & minimus, hamstring with trigger point release as needed                  PT Education - 10/18/19 1447    Education Details Discussed using foam roll if tennis ball is not hitting large enough area for            PT  Short Term Goals - 09/18/19 1524      PT SHORT TERM GOAL #1   Title Pt will report pain to <6/10 pain constantly    Baseline 8/10 constantly    Time 3    Period Weeks    Status New    Target Date 10/09/19      PT SHORT TERM GOAL #2   Title Pt will demonstrate normal gait pattern    Baseline Antalgic gait, decreased L LE stance phase, L hip hike    Time 3    Period Weeks    Status New    Target Date 10/09/19             PT Long Term Goals - 09/18/19 1524      PT LONG TERM GOAL #1   Title Pt will be independent with HEP    Time 6    Period Weeks    Status New    Target Date 10/30/19      PT LONG TERM GOAL #2   Title Pt will improve bilat hip strength to 5/5 in all directions    Baseline 3+/5 left hip abduction, 3+/5 left hip extension    Time 6    Period Weeks    Status New    Target Date 10/30/19      PT LONG TERM GOAL #3   Title Pt will report pain as </=4/10 for improved activity tolerance    Baseline 8/10 constantly, 5/10 at best    Time 6    Period Weeks    Status  New    Target Date 10/30/19      PT LONG TERM GOAL #4   Title Pt will be able to sleep without pain for sleep study    Baseline Unable to tolerate sleep long enough for sleep study    Time 6    Period Weeks    Status New    Target Date 10/30/19      PT LONG TERM GOAL #5   Title Pt will improve FOTO score to </=54% limitation    Baseline 68% limited    Time 6    Period Weeks    Status New    Target Date 10/30/19                 Plan - 10/18/19 1638    Clinical Impression Statement Pt with no SI pain/tightness this session. Pt with very tight R medial glute and L piriformis with multiple trigger points requiring extensive manual therapy and stretching to address this session. Treatment continued to focus on hip strengthening. Pt with reports of 0/10 pain after session. Educated pt on using foam roll to address these trigger points at home.    Personal Factors and Comorbidities Age;Fitness;Comorbidity 1    Comorbidities arthritis, anxiety, depression, chronic pain    Examination-Activity Limitations Bathing;Locomotion Level;Bend;Sit;Sleep;Stairs;Lift;Stand    Examination-Participation Restrictions Church;Community Activity;Laundry;Yard Work;Other    Stability/Clinical Decision Making Evolving/Moderate complexity    Rehab Potential Good    PT Frequency 2x / week   Ideally 2x/wk for 6 weeks; however, pending on insurance pt may only afford 1x/wk   PT Duration 6 weeks    PT Treatment/Interventions ADLs/Self Care Home Management;Electrical Stimulation;Cryotherapy;Iontophoresis 4mg /ml Dexamethasone;Moist Heat;Ultrasound;Gait training;Stair training;Functional mobility training;Therapeutic activities;Therapeutic exercise;Balance training;Neuromuscular re-education;Patient/family education;Manual techniques;Passive range of motion;Dry needling;Taping    PT Next Visit Plan Progress core and hip/SI strengthening exercises as needed. Perhaps perform more manual therapy as needed. Consider  taping, e-stim and/or  ionto.    PT Home Exercise Plan Access Code: [email or web address]    Consulted and Agree with Plan of Care Patient           Patient will benefit from skilled therapeutic intervention in order to improve the following deficits and impairments:  Abnormal gait, Decreased range of motion, Difficulty walking, Increased fascial restricitons, Pain, Hypomobility, Impaired flexibility, Improper body mechanics, Decreased mobility, Decreased strength, Increased edema  Visit Diagnosis: Pain in right hip  Pain in left hip  Muscle weakness (generalized)  Other abnormalities of gait and mobility     Problem List Patient Active Problem List   Diagnosis Date Noted  . Bilateral hand pain 10/09/2019  . TMJ (temporomandibular joint disorder) 07/31/2019  . Fatigue 07/05/2019  . Diaphoresis 05/01/2019  . Cough with exposure to COVID-19 virus 04/27/2019  . Lab test positive for detection of COVID-19 virus 04/10/2019  . Acute laryngitis 04/10/2019  . Greater trochanteric bursitis, left 02/27/2019  . Pain of left heel 02/27/2019  . Ear pain, right 02/26/2019  . Bilateral hearing loss 01/23/2019  . Uterine mass 12/19/2018  . Allergic rhinitis with postnasal drip 11/17/2018  . Diverticulosis of colon without hemorrhage 06/23/2018  . Arthritis of finger of right hand 06/23/2018  . Intractable migraine without aura and without status migrainosus 02/16/2018  . Breast asymmetry 11/30/2017  . Plantar fasciitis of right foot 07/06/2017  . Iron deficiency anemia 03/29/2017  . Vitamin D deficiency 03/01/2017  . Cough 01/11/2017  . Environmental allergies 11/03/2016  . Flank pain, chronic 08/27/2014  . Other cyst of bone, right ankle and foot 06/21/2014  . Lumbar herniated disc 06/06/2014  . Hip pain   . Depression   . Prediabetes 01/14/2014  . Spinal stenosis, lumbar region, with neurogenic claudication 11/21/2013  . Back pain 06/06/2013  . Left knee pain 01/10/2013  . Decreased  hearing 12/29/2011  . Hot flashes 06/30/2011  . Eczema, dyshidrotic 06/02/2010  . Chronic pain of right ankle 05/06/2010  . FIBROIDS, UTERUS 10/24/2009  . GERD 06/09/2009  . Chronic pain syndrome 10/04/2008  . HIP PAIN, LEFT, CHRONIC 08/16/2007  . Hepatitis C carrier ([place]) 03/21/2007  . Obesity 06/16/2006  . HYPERTENSION, BENIGN SYSTEMIC 06/16/2006    [facility] [address] PT, DPT 10/18/2019, 5:04 PM  [facility] [address] [place], [place], [address] Phone: [phone]   Fax:  [phone]  Name: LEGACY CARRENDER MRN: [id number] Date of Birth: 1960/08/05

## 2019-10-19 DIAGNOSIS — M79604 Pain in right leg: Secondary | ICD-10-CM | POA: Diagnosis not present

## 2019-10-19 DIAGNOSIS — M5442 Lumbago with sciatica, left side: Secondary | ICD-10-CM | POA: Diagnosis not present

## 2019-10-19 DIAGNOSIS — Z79899 Other long term (current) drug therapy: Secondary | ICD-10-CM | POA: Diagnosis not present

## 2019-10-19 DIAGNOSIS — M79605 Pain in left leg: Secondary | ICD-10-CM | POA: Diagnosis not present

## 2019-10-19 DIAGNOSIS — G8929 Other chronic pain: Secondary | ICD-10-CM | POA: Diagnosis not present

## 2019-10-23 ENCOUNTER — Other Ambulatory Visit: Payer: Self-pay

## 2019-10-23 ENCOUNTER — Ambulatory Visit: Payer: Medicare Other | Admitting: Physical Therapy

## 2019-10-23 DIAGNOSIS — M25551 Pain in right hip: Secondary | ICD-10-CM

## 2019-10-23 DIAGNOSIS — M25552 Pain in left hip: Secondary | ICD-10-CM

## 2019-10-23 DIAGNOSIS — R2689 Other abnormalities of gait and mobility: Secondary | ICD-10-CM | POA: Diagnosis not present

## 2019-10-23 DIAGNOSIS — M6281 Muscle weakness (generalized): Secondary | ICD-10-CM | POA: Diagnosis not present

## 2019-10-23 DIAGNOSIS — R293 Abnormal posture: Secondary | ICD-10-CM

## 2019-10-23 NOTE — Therapy (Signed)
[place], [place], [address] Phone: [phone]   Fax:  [phone]  Physical Therapy Treatment  Patient Details  Name: Norma [name] MRN: [id number] Date of Birth: 12-Feb-1961 Referring Provider (PT): Andrena Mews, MD   Encounter Date: 10/23/2019   PT End of Session - 10/23/19 1406    Visit Number 4    Number of Visits 12    Date for PT Re-Evaluation 10/30/19    PT Start Time 1404    PT Stop Time 1445    PT Time Calculation (min) 41 min    Activity Tolerance Patient tolerated treatment well    Behavior During Therapy [facility] for tasks assessed/performed           Past Medical History:  Diagnosis Date  . Acid reflux   . Allergy   . Anemia   . Anxiety   . Arthritis   . Back pain   . Depression   . Diabetes mellitus without complication ([place])    on meds  . Headache    stress headaches, migraines at times  . Heart murmur   . Hypertension   . Irregular heart beat   . Left shoulder pain   . Shortness of breath dyspnea    with exertion  . Tachyarrhythmia   . Trigger point of thoracic region 03/22/2012    Past Surgical History:  Procedure Laterality Date  . ANKLE SURGERY    . KNEE SURGERY Left   . LUMBAR LAMINECTOMY/DECOMPRESSION MICRODISCECTOMY Left 06/06/2014   Procedure: LUMBAR LAMINECTOMY/DECOMPRESSION MICRODISCECTOMY 1 LEVEL;  Surgeon: Newman Pies, MD;  Location: [place] [facility];  Service: Neurosurgery;  Laterality: Left;  Left L5S1 microdiskectomy  . ROTATOR CUFF REPAIR Left 02/20/14  . TUBAL LIGATION      There were no vitals filed for this visit.   Subjective Assessment - 10/23/19 1404    Subjective Pt reports L buttock pain. Pt states she tried to foam roll it but it remains quite tender. Pt reports she tried to stretch it as much as she was able. Pt reports pain started after leaving PT that Thursday evening -- pt believes it may have been from the foam roller.    Limitations  Walking;Sitting    How long can you walk comfortably? 10'; feels like she's hopping on it (L more than R)    Patient Stated Goals Sleep without pain and walk without pain.    Currently in Pain? Yes    Pain Score 6                              [facility] Adult PT Treatment/Exercise - 10/23/19 0001      Lumbar Exercises: Stretches   Hip Flexor Stretch Right;Left;30 seconds      Lumbar Exercises: Aerobic   Nustep L5 x 5 min      Lumbar Exercises: Standing   Heel Raises 20 reps    Functional Squats 10 reps      Lumbar Exercises: Supine   Bridge 20 reps    Other Supine Lumbar Exercises LTR x 10 reps with 3 sec hold      Lumbar Exercises: Prone   Straight Leg Raise 10 reps      Knee/Hip Exercises: Stretches   Piriformis Stretch Left;30 seconds      Knee/Hip Exercises: Standing   Lateral Step Up Both;10 reps;Hand Hold: 2      Knee/Hip Exercises: Sidelying  Hip ABduction Strengthening;10 reps      Manual Therapy   Manual Therapy Soft tissue mobilization    Soft tissue mobilization Lt piriformis, glutes, hamstring                    PT Short Term Goals - 09/18/19 1524      PT SHORT TERM GOAL #1   Title Pt will report pain to <6/10 pain constantly    Baseline 8/10 constantly    Time 3    Period Weeks    Status New    Target Date 10/09/19      PT SHORT TERM GOAL #2   Title Pt will demonstrate normal gait pattern    Baseline Antalgic gait, decreased L LE stance phase, L hip hike    Time 3    Period Weeks    Status New    Target Date 10/09/19             PT Long Term Goals - 09/18/19 1524      PT LONG TERM GOAL #1   Title Pt will be independent with HEP    Time 6    Period Weeks    Status New    Target Date 10/30/19      PT LONG TERM GOAL #2   Title Pt will improve bilat hip strength to 5/5 in all directions    Baseline 3+/5 left hip abduction, 3+/5 left hip extension    Time 6    Period Weeks    Status New    Target Date  10/30/19      PT LONG TERM GOAL #3   Title Pt will report pain as </=4/10 for improved activity tolerance    Baseline 8/10 constantly, 5/10 at best    Time 6    Period Weeks    Status New    Target Date 10/30/19      PT LONG TERM GOAL #4   Title Pt will be able to sleep without pain for sleep study    Baseline Unable to tolerate sleep long enough for sleep study    Time 6    Period Weeks    Status New    Target Date 10/30/19      PT LONG TERM GOAL #5   Title Pt will improve FOTO score to </=54% limitation    Baseline 68% limited    Time 6    Period Weeks    Status New    Target Date 10/30/19                 Plan - 10/23/19 1513    Clinical Impression Statement Pt comes into clinic with multiple L hip abductor/extensor trigger points. PT addressed with manual therapy and stretching -- pt reports improvement in pain. PT continues to focus on hip and LE strengthening.    Personal Factors and Comorbidities Age;Fitness;Comorbidity 1    Comorbidities arthritis, anxiety, depression, chronic pain    Examination-Activity Limitations Bathing;Locomotion Level;Bend;Sit;Sleep;Stairs;Lift;Stand    Examination-Participation Restrictions Church;Community Activity;Laundry;Yard Work;Other    Stability/Clinical Decision Making Evolving/Moderate complexity    Rehab Potential Good    PT Frequency 2x / week   Ideally 2x/wk for 6 weeks; however, pending on insurance pt may only afford 1x/wk   PT Duration 6 weeks    PT Treatment/Interventions ADLs/Self Care Home Management;Electrical Stimulation;Cryotherapy;Iontophoresis 4mg /ml Dexamethasone;Moist Heat;Ultrasound;Gait training;Stair training;Functional mobility training;Therapeutic activities;Therapeutic exercise;Balance training;Neuromuscular re-education;Patient/family education;Manual techniques;Passive range of motion;Dry needling;Taping    PT Next  Visit Plan Progress core and hip/LE strengthening exercises as needed. Consider more step  training and leg press. Consider taping, e-stim and/or ionto.    PT Home Exercise Plan Access Code: [email or web address]    Consulted and Agree with Plan of Care Patient           Patient will benefit from skilled therapeutic intervention in order to improve the following deficits and impairments:  Abnormal gait, Decreased range of motion, Difficulty walking, Increased fascial restricitons, Pain, Hypomobility, Impaired flexibility, Improper body mechanics, Decreased mobility, Decreased strength, Increased edema  Visit Diagnosis: Pain in right hip  Pain in left hip  Muscle weakness (generalized)  Other abnormalities of gait and mobility  Abnormal posture     Problem List Patient Active Problem List   Diagnosis Date Noted  . Bilateral hand pain 10/09/2019  . TMJ (temporomandibular joint disorder) 07/31/2019  . Fatigue 07/05/2019  . Diaphoresis 05/01/2019  . Cough with exposure to COVID-19 virus 04/27/2019  . Lab test positive for detection of COVID-19 virus 04/10/2019  . Acute laryngitis 04/10/2019  . Greater trochanteric bursitis, left 02/27/2019  . Pain of left heel 02/27/2019  . Ear pain, right 02/26/2019  . Bilateral hearing loss 01/23/2019  . Uterine mass 12/19/2018  . Allergic rhinitis with postnasal drip 11/17/2018  . Diverticulosis of colon without hemorrhage 06/23/2018  . Arthritis of finger of right hand 06/23/2018  . Intractable migraine without aura and without status migrainosus 02/16/2018  . Breast asymmetry 11/30/2017  . Plantar fasciitis of right foot 07/06/2017  . Iron deficiency anemia 03/29/2017  . Vitamin D deficiency 03/01/2017  . Cough 01/11/2017  . Environmental allergies 11/03/2016  . Flank pain, chronic 08/27/2014  . Other cyst of bone, right ankle and foot 06/21/2014  . Lumbar herniated disc 06/06/2014  . Hip pain   . Depression   . Prediabetes 01/14/2014  . Spinal stenosis, lumbar region, with neurogenic claudication 11/21/2013  . Back pain  06/06/2013  . Left knee pain 01/10/2013  . Decreased hearing 12/29/2011  . Hot flashes 06/30/2011  . Eczema, dyshidrotic 06/02/2010  . Chronic pain of right ankle 05/06/2010  . FIBROIDS, UTERUS 10/24/2009  . GERD 06/09/2009  . Chronic pain syndrome 10/04/2008  . HIP PAIN, LEFT, CHRONIC 08/16/2007  . Hepatitis C carrier ([place]) 03/21/2007  . Obesity 06/16/2006  . HYPERTENSION, BENIGN SYSTEMIC 06/16/2006    [facility] April Ma L Kyreese Chio PT, DPT 10/23/2019, 5:58 PM  [facility] [address] [place], [place], [address] Phone: [phone]   Fax:  [phone]  Name: Norma [name] MRN: [id number] Date of Birth: 08/06/1960

## 2019-10-24 DIAGNOSIS — D649 Anemia, unspecified: Secondary | ICD-10-CM | POA: Diagnosis not present

## 2019-10-24 DIAGNOSIS — E1165 Type 2 diabetes mellitus with hyperglycemia: Secondary | ICD-10-CM | POA: Diagnosis not present

## 2019-10-24 DIAGNOSIS — I425 Other restrictive cardiomyopathy: Secondary | ICD-10-CM | POA: Diagnosis not present

## 2019-10-24 DIAGNOSIS — E876 Hypokalemia: Secondary | ICD-10-CM | POA: Diagnosis not present

## 2019-10-24 DIAGNOSIS — I340 Nonrheumatic mitral (valve) insufficiency: Secondary | ICD-10-CM | POA: Diagnosis not present

## 2019-10-24 DIAGNOSIS — Z79899 Other long term (current) drug therapy: Secondary | ICD-10-CM | POA: Diagnosis not present

## 2019-10-24 DIAGNOSIS — E7849 Other hyperlipidemia: Secondary | ICD-10-CM | POA: Diagnosis not present

## 2019-10-25 ENCOUNTER — Ambulatory Visit: Payer: Medicare Other | Admitting: Physical Therapy

## 2019-10-30 ENCOUNTER — Ambulatory Visit: Payer: Medicare Other | Admitting: Physical Therapy

## 2019-10-30 ENCOUNTER — Other Ambulatory Visit: Payer: Self-pay

## 2019-10-30 VITALS — BP 124/89

## 2019-10-30 DIAGNOSIS — M6281 Muscle weakness (generalized): Secondary | ICD-10-CM | POA: Diagnosis not present

## 2019-10-30 DIAGNOSIS — M25552 Pain in left hip: Secondary | ICD-10-CM | POA: Diagnosis not present

## 2019-10-30 DIAGNOSIS — R2689 Other abnormalities of gait and mobility: Secondary | ICD-10-CM

## 2019-10-30 DIAGNOSIS — R293 Abnormal posture: Secondary | ICD-10-CM | POA: Diagnosis not present

## 2019-10-30 DIAGNOSIS — M25551 Pain in right hip: Secondary | ICD-10-CM

## 2019-10-30 NOTE — Therapy (Addendum)
[place], [place], [address] Phone: [phone]   Fax:  [phone]  Physical Therapy Treatment/Re-Certification and Discharge  Patient Details  Name: Norma [name] MRN: [id number] Date of Birth: 08/05/1960 Referring Provider (PT): Andrena Mews, MD   Encounter Date: 10/30/2019   PT End of Session - 10/30/19 1406    Visit Number 5    Number of Visits 12    Date for PT Re-Evaluation 10/30/19    PT Start Time 1406    PT Stop Time 1450    PT Time Calculation (min) 44 min    Activity Tolerance Patient tolerated treatment well    Behavior During Therapy [facility] for tasks assessed/performed          PHYSICAL THERAPY DISCHARGE SUMMARY  Visits from Start of Care: 5  Current functional level related to goals / functional outcomes: See below   Remaining deficits: See below   Education / Equipment: See below  Plan: Patient agrees to discharge.  Patient goals were not met. Patient is being discharged due to not returning since the last visit.  ?????        Past Medical History:  Diagnosis Date  . Acid reflux   . Allergy   . Anemia   . Anxiety   . Arthritis   . Back pain   . Depression   . Diabetes mellitus without complication ([place])    on meds  . Headache    stress headaches, migraines at times  . Heart murmur   . Hypertension   . Irregular heart beat   . Left shoulder pain   . Shortness of breath dyspnea    with exertion  . Tachyarrhythmia   . Trigger point of thoracic region 03/22/2012    Past Surgical History:  Procedure Laterality Date  . ANKLE SURGERY    . KNEE SURGERY Left   . LUMBAR LAMINECTOMY/DECOMPRESSION MICRODISCECTOMY Left 06/06/2014   Procedure: LUMBAR LAMINECTOMY/DECOMPRESSION MICRODISCECTOMY 1 LEVEL;  Surgeon: Newman Pies, MD;  Location: [place] [facility];  Service: Neurosurgery;  Laterality: Left;  Left L5S1 microdiskectomy  . ROTATOR CUFF REPAIR Left 02/20/14  .  TUBAL LIGATION      Vitals:   10/30/19 1413  BP: 124/89     Subjective Assessment - 10/30/19 1407    Subjective Pt reports R sided chest pain radiating to behind her shoulder blade. Pt states she saw her cardiologist last week so she had not tried to call to ask about her pain. Reports that chest pains started Saturday morning randomly. Pt does not note any major lifting that could have caused this; she does state it feels like a muscle tightness.    Limitations Walking;Sitting    How long can you walk comfortably? 10'; feels like she's hopping on it (L more than R)    Patient Stated Goals Sleep without pain and walk without pain.    Currently in Pain? Yes    Pain Score 6     Pain Location Hip    Pain Orientation Left                             [facility] Adult PT Treatment/Exercise - 10/30/19 0001      Lumbar Exercises: Stretches   ITB Stretch Left;30 seconds    Other Lumbar Stretch Exercise doorway stretch x 30 sec bilat mid & high      Lumbar Exercises: Aerobic   Nustep  L3 x 5 min   due to pt c/o R side chest pain     Knee/Hip Exercises: Standing   Hip Abduction Stengthening;Both;10 reps    Abduction Limitations blue tband    Hip Extension Stengthening;Both;10 reps    Extension Limitations blue tband      Manual Therapy   Manual Therapy Soft tissue mobilization;Joint mobilization    Joint Mobilization L hip lateral and inferior glides grade III    Soft tissue mobilization Lt piriformis, glutes, ITB; IASTM along ITB; pt performed self trigger point release with tennis ball on glutes and midthoracic                    PT Short Term Goals - 10/30/19 1804      PT SHORT TERM GOAL #1   Title Pt will report pain to <6/10 pain constantly    Baseline 8/10 constantly    Time 3    Period Weeks    Status Revised    Target Date 11/20/19      PT SHORT TERM GOAL #2   Title Pt will demonstrate normal gait pattern    Baseline Antalgic gait, decreased L  LE stance phase, L hip hike    Time 3    Period Weeks    Status Achieved    Target Date 10/09/19             PT Long Term Goals - 10/30/19 1758      PT LONG TERM GOAL #1   Title Pt will be independent with HEP    Time 6    Period Weeks    Status Revised    Target Date 12/11/19      PT LONG TERM GOAL #2   Title Pt will improve bilat hip strength to 5/5 in all directions for stair negotiation    Baseline 3+/5 left hip abduction, 3+/5 left hip extension    Time 6    Period Weeks    Status Revised    Target Date 12/11/19      PT LONG TERM GOAL #3   Title Pt will report pain as </=4/10 for improved activity tolerance    Baseline 8/10 constantly, 5/10 at best    Time 6    Period Weeks    Status Revised    Target Date 12/11/19      PT LONG TERM GOAL #4   Title Pt will be independent with self managing her trigger points at home    Baseline Limited self management    Time 6    Period Weeks    Status Revised    Target Date 12/11/19      PT LONG TERM GOAL #5   Title Pt will improve FOTO score to </=54% limitation    Baseline 68% limited at baseline; will reassess next visit    Time 6    Period Weeks    Status Revised    Target Date 12/11/19                 Plan - 10/30/19 1755    Clinical Impression Statement Re-evaluation performed due to pt meeting cert date -- pt has only been to clinic for 5 visits. Goals revised accordingly. Progress limited due to pt caring for her demented mother and bouts of debilitating pain. Pt comes into clinic with L hip abductor trigger points and c/o chest pain wrapping into between her shoulder blades. Treatment focused on manual therapy, stretching, and strengthening.  Discussed with pt continuing to use tennis ball for self trigger point release at home and being consistent with her HEP.    Personal Factors and Comorbidities Age;Fitness;Comorbidity 1    Comorbidities arthritis, anxiety, depression, chronic pain     Examination-Activity Limitations Bathing;Locomotion Level;Bend;Sit;Sleep;Stairs;Lift;Stand    Examination-Participation Restrictions Church;Community Activity;Laundry;Yard Work;Other    Stability/Clinical Decision Making Evolving/Moderate complexity    Rehab Potential Good    PT Frequency 2x / week   Ideally 2x/wk for 6 weeks; however, pending on insurance pt may only afford 1x/wk   PT Duration 6 weeks    PT Treatment/Interventions ADLs/Self Care Home Management;Electrical Stimulation;Cryotherapy;Iontophoresis 103m/ml Dexamethasone;Moist Heat;Ultrasound;Gait training;Stair training;Functional mobility training;Therapeutic activities;Therapeutic exercise;Balance training;Neuromuscular re-education;Patient/family education;Manual techniques;Passive range of motion;Dry needling;Taping    PT Next Visit Plan Progress core and hip/LE strengthening exercises as needed. Manual therapy as indicated. Consider more step training and leg press. Consider taping, e-stim and/or ionto.    PT Home Exercise Plan Access Code: [email or web address]    Consulted and Agree with Plan of Care Patient           Patient will benefit from skilled therapeutic intervention in order to improve the following deficits and impairments:  Abnormal gait, Decreased range of motion, Difficulty walking, Increased fascial restricitons, Pain, Hypomobility, Impaired flexibility, Improper body mechanics, Decreased mobility, Decreased strength, Increased edema  Visit Diagnosis: Pain in right hip  Pain in left hip  Muscle weakness (generalized)  Other abnormalities of gait and mobility  Abnormal posture     Problem List Patient Active Problem List   Diagnosis Date Noted  . Bilateral hand pain 10/09/2019  . TMJ (temporomandibular joint disorder) 07/31/2019  . Fatigue 07/05/2019  . Diaphoresis 05/01/2019  . Cough with exposure to COVID-19 virus 04/27/2019  . Lab test positive for detection of COVID-19 virus 04/10/2019  . Acute  laryngitis 04/10/2019  . Greater trochanteric bursitis, left 02/27/2019  . Pain of left heel 02/27/2019  . Ear pain, right 02/26/2019  . Bilateral hearing loss 01/23/2019  . Uterine mass 12/19/2018  . Allergic rhinitis with postnasal drip 11/17/2018  . Diverticulosis of colon without hemorrhage 06/23/2018  . Arthritis of finger of right hand 06/23/2018  . Intractable migraine without aura and without status migrainosus 02/16/2018  . Breast asymmetry 11/30/2017  . Plantar fasciitis of right foot 07/06/2017  . Iron deficiency anemia 03/29/2017  . Vitamin D deficiency 03/01/2017  . Cough 01/11/2017  . Environmental allergies 11/03/2016  . Flank pain, chronic 08/27/2014  . Other cyst of bone, right ankle and foot 06/21/2014  . Lumbar herniated disc 06/06/2014  . Hip pain   . Depression   . Prediabetes 01/14/2014  . Spinal stenosis, lumbar region, with neurogenic claudication 11/21/2013  . Back pain 06/06/2013  . Left knee pain 01/10/2013  . Decreased hearing 12/29/2011  . Hot flashes 06/30/2011  . Eczema, dyshidrotic 06/02/2010  . Chronic pain of right ankle 05/06/2010  . FIBROIDS, UTERUS 10/24/2009  . GERD 06/09/2009  . Chronic pain syndrome 10/04/2008  . HIP PAIN, LEFT, CHRONIC 08/16/2007  . Hepatitis C carrier ([place] 03/21/2007  . Obesity 06/16/2006  . HYPERTENSION, BENIGN SYSTEMIC 06/16/2006    GSpring[facility] SystemA[address]PT, DPT 10/30/2019, 6:07 PM  [facility]GLanghorne [place] [address]Phone: [phone]  Fax:  [phone] Name: Norma HIGDONMRN: [id number] of Birth: 8March 14, 1962

## 2019-11-01 ENCOUNTER — Ambulatory Visit: Payer: Medicare Other | Admitting: Physical Therapy

## 2019-11-14 ENCOUNTER — Ambulatory Visit (INDEPENDENT_AMBULATORY_CARE_PROVIDER_SITE_OTHER): Payer: Medicare Other | Admitting: Family Medicine

## 2019-11-14 ENCOUNTER — Other Ambulatory Visit: Payer: Self-pay

## 2019-11-14 VITALS — BP 124/80 | HR 72

## 2019-11-14 DIAGNOSIS — Z77011 Contact with and (suspected) exposure to lead: Secondary | ICD-10-CM | POA: Diagnosis not present

## 2019-11-14 DIAGNOSIS — M5441 Lumbago with sciatica, right side: Secondary | ICD-10-CM | POA: Diagnosis not present

## 2019-11-14 DIAGNOSIS — G8929 Other chronic pain: Secondary | ICD-10-CM | POA: Diagnosis not present

## 2019-11-14 DIAGNOSIS — M5442 Lumbago with sciatica, left side: Secondary | ICD-10-CM | POA: Diagnosis not present

## 2019-11-14 MED ORDER — DICLOFENAC SODIUM 75 MG PO TBEC: 75.0000 mg | DELAYED_RELEASE_TABLET | Freq: Two times a day (BID) | ORAL | 0 refills | Status: DC

## 2019-11-14 NOTE — Progress Notes (Signed)
SUBJECTIVE:   CHIEF COMPLAINT / HPI:   Lead in pipes: [name] is a pleasant 59 year old female presenting to clinic today with concerns for a recent discovery made at the rental property where she lives.  Reports that they were recently having issues with sinks and toilets backing up, plumber came out to assess the home, revealed to the patient that the home's plumbing is all galvanized and lead pipes.  The patient has lived in this home for 3 years.  Reports she has been experiencing body aches, mental fatigue, difficulties with concentration, decreased appetite, occasional abdominal pain, and nausea.  She is concerned that her symptoms are due to high levels of lead.  Patient reports she is now actively looking for a new place to live, is also now only drinking bottled water and avoiding showering in her own home.  Medication refill: Patient requesting refill of diclofenac for chronic back pain, reports she is out  PERTINENT  PMH / PSH:  Patient Active Problem List   Diagnosis Date Noted  . Lead exposure 11/14/2019  . Bilateral hand pain 10/09/2019  . TMJ (temporomandibular joint disorder) 07/31/2019  . Fatigue 07/05/2019  . Diaphoresis 05/01/2019  . Cough with exposure to COVID-19 virus 04/27/2019  . Lab test positive for detection of COVID-19 virus 04/10/2019  . Acute laryngitis 04/10/2019  . Greater trochanteric bursitis, left 02/27/2019  . Pain of left heel 02/27/2019  . Ear pain, right 02/26/2019  . Bilateral hearing loss 01/23/2019  . Uterine mass 12/19/2018  . Allergic rhinitis with postnasal drip 11/17/2018  . Diverticulosis of colon without hemorrhage 06/23/2018  . Arthritis of finger of right hand 06/23/2018  . Intractable migraine without aura and without status migrainosus 02/16/2018  . Breast asymmetry 11/30/2017  . Plantar fasciitis of right foot 07/06/2017  . Iron deficiency anemia 03/29/2017  . Vitamin D deficiency 03/01/2017  . Cough 01/11/2017  .  Environmental allergies 11/03/2016  . Flank pain, chronic 08/27/2014  . Other cyst of bone, right ankle and foot 06/21/2014  . Lumbar herniated disc 06/06/2014  . Hip pain   . Depression   . Prediabetes 01/14/2014  . Spinal stenosis, lumbar region, with neurogenic claudication 11/21/2013  . Back pain 06/06/2013  . Left knee pain 01/10/2013  . Decreased hearing 12/29/2011  . Hot flashes 06/30/2011  . Eczema, dyshidrotic 06/02/2010  . Chronic pain of right ankle 05/06/2010  . FIBROIDS, UTERUS 10/24/2009  . GERD 06/09/2009  . Chronic pain syndrome 10/04/2008  . HIP PAIN, LEFT, CHRONIC 08/16/2007  . Hepatitis C carrier ([place]) 03/21/2007  . Obesity 06/16/2006  . HYPERTENSION, BENIGN SYSTEMIC 06/16/2006     OBJECTIVE:   BP 124/80   Pulse 72   SpO2 97%    Physical exam: General: Pleasant patient, no apparent distress HEENT: Normal-appearing gums without "lead lines" Respiratory: CTA bilaterally, comfortable work of breathing Cardio: RRR, S1-S2 present, no murmurs appreciated Abdomen: Normal bowel sounds appreciated   ASSESSMENT/PLAN:   Back pain -Refilled diclofenac for patient's chronic back pain  Lead exposure Concern for lead exposure due to drinking water from lead pipes in patient's rental home.  Would like to rule out lead as cause for patient's symptoms (fatigue, lack of concentration, body aches).  CBC in January 2021 showed normal hemoglobin level, negative for microcytic anemia. -Checking lead level, if of normal can recheck CBC and perform peripheral smear -Encourage patient to continue to consume only bottled water, avoid long showers -Patient actively looking for a new place to  live, would like help and assistance with housing - chronic care management referral request made, patient made aware and grateful -Follow-up as needed     Norma [name], [place]

## 2019-11-14 NOTE — Patient Instructions (Addendum)
Thank you for coming in to see [place] today! Please see below to review our plan for today's visit:  1. We are checking a blood lead level. I will call you with results. 2. Keep consuming only bottled water at home. Limit showering time. Keep looking for a new residence. 3. I have reached out to Chronic Care Management to help you with housing options : )  Please call the clinic at [phone] if your symptoms worsen or you have any concerns. It was our pleasure to serve you!   Dr. Milus [facility]

## 2019-11-14 NOTE — Assessment & Plan Note (Signed)
-  Refilled diclofenac for patient's chronic back pain

## 2019-11-14 NOTE — Assessment & Plan Note (Addendum)
Concern for lead exposure due to drinking water from lead pipes in patient's rental home.  Would like to rule out lead as cause for patient's symptoms (fatigue, lack of concentration, body aches).  CBC in January 2021 showed normal hemoglobin level, negative for microcytic anemia. -Checking lead level, if of normal can recheck CBC and perform peripheral smear -Encourage patient to continue to consume only bottled water, avoid long showers -Patient actively looking for a new place to live, would like help and assistance with housing - chronic care management referral request made, patient made aware and grateful -Follow-up as needed

## 2019-11-15 ENCOUNTER — Ambulatory Visit: Payer: Self-pay | Admitting: Licensed Clinical Social Worker

## 2019-11-15 LAB — LEAD, BLOOD (ADULT >= 16 YRS): Lead-Whole Blood: <1 ug/dL (ref 0–4)

## 2019-11-15 NOTE — Chronic Care Management (AMB) (Signed)
   Social Work Care Management  Referral Note  0/34/9611 Name: PRARTHANA PARLIN MRN: [id number] DOB: [birth year]  NAHIA NISSAN is a 59 y.o. year old female who sees Meccariello, Bernita Raisin, DO for primary care.  LCSW was consulted by Dr. Ouida Sills to assistance patient with Community Resources due to housing issues and concerns.   Review of patient status, including review of consultants reports, relevant laboratory and other test results, as well as collaboration with appropriate care team members,  and the patient's provider was performed as part of comprehensive patient evaluation and provision of chronic care management services.    Recommendation: After chart review it is determined that no clinical needs are identified for LCSW. Patient's needs are best met by CCM care guides.  Plan:  1. Patient is being referred to the [facility] for assistance with community resources for housing needs.  2.   The Care Guide will contact the Care Management team if clinical needs are identified   Casimer Lanius, [place] / [place]   [phone] 4:32 PM

## 2019-11-19 ENCOUNTER — Ambulatory Visit: Payer: Medicare Other | Admitting: Neurology

## 2019-11-19 DIAGNOSIS — G8929 Other chronic pain: Secondary | ICD-10-CM | POA: Diagnosis not present

## 2019-11-19 DIAGNOSIS — Z9181 History of falling: Secondary | ICD-10-CM | POA: Diagnosis not present

## 2019-11-19 DIAGNOSIS — Z79899 Other long term (current) drug therapy: Secondary | ICD-10-CM | POA: Diagnosis not present

## 2019-11-19 DIAGNOSIS — E559 Vitamin D deficiency, unspecified: Secondary | ICD-10-CM | POA: Diagnosis not present

## 2019-11-19 DIAGNOSIS — M5442 Lumbago with sciatica, left side: Secondary | ICD-10-CM | POA: Diagnosis not present

## 2019-11-21 ENCOUNTER — Ambulatory Visit: Payer: Medicare Other | Admitting: Physical Therapy

## 2019-11-21 ENCOUNTER — Telehealth: Payer: Self-pay | Admitting: Family Medicine

## 2019-11-21 NOTE — Telephone Encounter (Signed)
   SF 05/28/2444   Name: [name]   MRN: [id number]   DOB: April 13, 1961   AGE: 58 y.o.   GENDER: female   PCP Meccariello, Bernita Raisin, DO.   Called pt regarding [employer] Referral for housing. Patient did not answer, could not leave voicemail due to voicemail box being full. Will try to contact patient again at a later time.  Follow up on: 11/23/19  [place], Care Management Phone: [phone] Email: [email or web address]

## 2019-11-22 NOTE — Telephone Encounter (Signed)
   SF 08/19/2021   Name: Norma [name]   MRN: [id number]   DOB: 1960-07-22   AGE: 59 y.o.   GENDER: female   PCP Meccariello, Bernita Raisin, DO.   Called pt regarding [employer] Referral for housing resources, mold and lead exposure. Patient stated that currently she is looking for some where else to stay. Asked patient she was familiar with [employer] or [employer]? Patient stated that she cannot work with [employer] due to some issues from the past, and she has contacted [employer], but has not seen any properties that interest her. Informed patient about social serve, which is a website that assist with searching for housing. E-mailed patient resource information. Per LCSW in the office, need authorization to send referral via [place]. Patient gave authorization sent message to LCSW to let them know that referral can be sent via [place].   To: [email or web address]> From: [email or web address] Subject: Secure: Housing Resources  Hi Ms. Frediani,   Thanks you for taking the time to speak with me today. Below is the information for Social Serve, which is a website that can be used to search for housing in the [place] area.   [email or web address]  Please feel free to contact the office if you have any additional needs.   Thanks,   [place], Care Management Phone: [phone] Email: [email or web address]    Follow up on: 11/22/2019  [place], Care Management Phone: [phone] Email: [email or web address]

## 2019-11-23 ENCOUNTER — Ambulatory Visit: Payer: Self-pay | Admitting: Licensed Clinical Social Worker

## 2019-11-23 DIAGNOSIS — Z77011 Contact with and (suspected) exposure to lead: Secondary | ICD-10-CM

## 2019-11-23 NOTE — Chronic Care Management (AMB) (Signed)
   Social Work  Care Management Collaboration 5/0/5397 Name: [name] MRN: [id number] DOB: [birth year] Norma [name] is a 59 y.o. year old female who sees Meccariello, Bernita Raisin, DO for primary care. LCSW was consulted by PCP to assistance patient with  Housing and Environmental concerns in her home.  Intervention: Patient was not interviewed or contacted during this encounter.  LCSW collaborated with [place] for needs.  Referral placed via [email or web address]. Plan:  1. No further follow up required by LCSW at this time   2.   If further intervention or needs are Identified please consult   Review of patient status, including review of consultants reports, relevant laboratory and other test results, and collaboration with appropriate care team members and the patient's provider was performed as part of comprehensive patient evaluation and provision of chronic care management services.   SDOH Interventions     Most Recent Value  SDOH Interventions  SDOH Interventions for the Following Domains Housing  Housing Interventions [id number] Referral     Casimer Lanius, [place] / [place]   [phone] 3:34 PM

## 2019-11-23 NOTE — Telephone Encounter (Signed)
Received message from social worker that is it okay to close referral. [place] CARE360 has been placed by social worker and patient has been e-mailed housing resources. Patient has not additional needs at this time.   Closing referral pending any other needs of patient.

## 2019-11-28 ENCOUNTER — Telehealth: Payer: Self-pay

## 2019-11-28 NOTE — Telephone Encounter (Signed)
Called to talk to patient about another failed home sleep test. Because this is the second failed study, she will need to come into the sleep lab. Pt will be calling me back to let me know what days work best for her schedule.

## 2019-12-10 ENCOUNTER — Telehealth: Payer: Self-pay

## 2019-12-10 ENCOUNTER — Ambulatory Visit: Payer: Medicare Other | Admitting: Physical Therapy

## 2019-12-10 NOTE — Telephone Encounter (Signed)
LVM for pt to call me back to schedule sleep study  

## 2019-12-18 ENCOUNTER — Ambulatory Visit: Payer: Medicare Other | Attending: Family Medicine | Admitting: Physical Therapy

## 2019-12-18 ENCOUNTER — Telehealth: Payer: Self-pay

## 2019-12-18 NOTE — Telephone Encounter (Signed)
LVM for pt to call me back to schedule sleep study   We have attempted to call the patient three times to schedule sleep study.  Patient has been unavailable at the phone numbers we have on file and has not returned our calls.  If patient calls back we will schedule them for their sleep study.  

## 2019-12-19 DIAGNOSIS — M5442 Lumbago with sciatica, left side: Secondary | ICD-10-CM | POA: Diagnosis not present

## 2019-12-19 DIAGNOSIS — Z9181 History of falling: Secondary | ICD-10-CM | POA: Diagnosis not present

## 2019-12-19 DIAGNOSIS — G8929 Other chronic pain: Secondary | ICD-10-CM | POA: Diagnosis not present

## 2019-12-19 DIAGNOSIS — Z79899 Other long term (current) drug therapy: Secondary | ICD-10-CM | POA: Diagnosis not present

## 2019-12-19 DIAGNOSIS — E559 Vitamin D deficiency, unspecified: Secondary | ICD-10-CM | POA: Diagnosis not present

## 2020-01-09 DIAGNOSIS — Z967 Presence of other bone and tendon implants: Secondary | ICD-10-CM | POA: Diagnosis not present

## 2020-01-09 DIAGNOSIS — M71371 Other bursal cyst, right ankle and foot: Secondary | ICD-10-CM | POA: Diagnosis not present

## 2020-01-09 DIAGNOSIS — T8484XA Pain due to internal orthopedic prosthetic devices, implants and grafts, initial encounter: Secondary | ICD-10-CM | POA: Diagnosis not present

## 2020-01-09 DIAGNOSIS — M25571 Pain in right ankle and joints of right foot: Secondary | ICD-10-CM | POA: Diagnosis not present

## 2020-01-09 DIAGNOSIS — R2241 Localized swelling, mass and lump, right lower limb: Secondary | ICD-10-CM | POA: Diagnosis not present

## 2020-01-15 ENCOUNTER — Other Ambulatory Visit: Payer: Self-pay | Admitting: Family Medicine

## 2020-01-17 DIAGNOSIS — Z9181 History of falling: Secondary | ICD-10-CM | POA: Diagnosis not present

## 2020-01-17 DIAGNOSIS — G8929 Other chronic pain: Secondary | ICD-10-CM | POA: Diagnosis not present

## 2020-01-17 DIAGNOSIS — E559 Vitamin D deficiency, unspecified: Secondary | ICD-10-CM | POA: Diagnosis not present

## 2020-01-17 DIAGNOSIS — Z79899 Other long term (current) drug therapy: Secondary | ICD-10-CM | POA: Diagnosis not present

## 2020-01-17 DIAGNOSIS — Z23 Encounter for immunization: Secondary | ICD-10-CM | POA: Diagnosis not present

## 2020-01-17 DIAGNOSIS — M5442 Lumbago with sciatica, left side: Secondary | ICD-10-CM | POA: Diagnosis not present

## 2020-01-30 ENCOUNTER — Other Ambulatory Visit: Payer: Self-pay

## 2020-01-30 ENCOUNTER — Ambulatory Visit: Payer: Medicare Other | Admitting: Family Medicine

## 2020-01-30 ENCOUNTER — Ambulatory Visit
Admission: EM | Admit: 2020-01-30 | Discharge: 2020-01-30 | Disposition: A | Discharge status: Home / Self Care | Payer: Medicare Other | Attending: Emergency Medicine | Admitting: Emergency Medicine

## 2020-01-30 DIAGNOSIS — J069 Acute upper respiratory infection, unspecified: Secondary | ICD-10-CM

## 2020-01-30 DIAGNOSIS — Z7689 Persons encountering health services in other specified circumstances: Secondary | ICD-10-CM | POA: Diagnosis not present

## 2020-01-30 MED ORDER — FLUTICASONE PROPIONATE 50 MCG/ACT NA SUSP
1.0000 | Freq: Every day | NASAL | 0 refills | Status: DC
Start: 2020-01-30 — End: 2021-01-02

## 2020-01-30 MED ORDER — CETIRIZINE HCL 10 MG PO TABS
10.0000 mg | ORAL_TABLET | Freq: Every day | ORAL | 0 refills | Status: DC
Start: 2020-01-30 — End: 2020-11-07

## 2020-01-30 MED ORDER — BENZONATATE 100 MG PO CAPS
100.0000 mg | ORAL_CAPSULE | Freq: Three times a day (TID) | ORAL | 0 refills | Status: DC
Start: 2020-01-30 — End: 2020-10-06

## 2020-01-30 NOTE — Progress Notes (Deleted)
    SUBJECTIVE:   CHIEF COMPLAINT / HPI:   Prediabetes She is not currently on any medication Last CMP on 1/13, last A1c also at that time was 6.1, which would be diabetic range Eye exam***  Hypertension Current regimen:*** Last CMP 1/13 within normal limits Denies chest pain, shortness of breath, leg swelling***  Covid vaccine***   PERTINENT  PMH / PSH: HTN, migraine, allergic rhinitis, GERD, obesity, chronic pain syndrome   OBJECTIVE:   There were no vitals taken for this visit.   Physical Exam: *** General: 59 y.o. female in NAD Cardio: RRR no m/r/g Lungs: CTAB, no wheezing, no rhonchi, no crackles, no IWOB on *** Abdomen: Soft, non-tender to palpation, non-distended, positive bowel sounds Skin: warm and dry Extremities: No edema   ASSESSMENT/PLAN:   No problem-specific Assessment & Plan notes found for this encounter.     Cleophas Dunker, [place]

## 2020-01-30 NOTE — Discharge Instructions (Addendum)
Your COVID test is pending - it is important to quarantine / isolate at home until your results are back. °If you test positive and would like further evaluation for persistent or worsening symptoms, you may schedule an E-visit or virtual (video) visit throughout the [facility] MyChart app or website. ° °PLEASE NOTE: If you develop severe chest pain or shortness of breath please go to the ER or call 9-1-1 for further evaluation --> DO NOT schedule electronic or virtual visits for this. °Please call our office for further guidance / recommendations as needed. ° °For information about the Covid vaccine, please visit [email or web address] °

## 2020-01-30 NOTE — ED Triage Notes (Signed)
Pt states she has had headaches, generalized body aches, diarrhea, and sore throat since Sunday. Pt is aox4 and ambulatory.

## 2020-01-30 NOTE — ED Provider Notes (Signed)
[facility]    CSN: [id number] Arrival date & time: 01/30/20  1344      History   Chief Complaint Chief Complaint  Patient presents with  . Headache    since sunday  . Generalized Body Aches    since sunday    HPI Norma [name] is a 59 y.o. female  With extensive history as outlined below presenting for 3-day course of generalized myalgias, headache, loose stools, sore throat.  Endorsing nasal congestion with postnasal drip.  Has taken OTC medications with some relief.  Denies chest pain, shortness of breath, vomiting or abdominal pain.  No known sick contacts.  Past Medical History:  Diagnosis Date  . Acid reflux   . Allergy   . Anemia   . Anxiety   . Arthritis   . Back pain   . Depression   . Diabetes mellitus without complication ([place])    on meds  . Headache    stress headaches, migraines at times  . Heart murmur   . Hypertension   . Irregular heart beat   . Left shoulder pain   . Shortness of breath dyspnea    with exertion  . Tachyarrhythmia   . Trigger point of thoracic region 03/22/2012    Patient Active Problem List   Diagnosis Date Noted  . Lead exposure 11/14/2019  . TMJ (temporomandibular joint disorder) 07/31/2019  . Greater trochanteric bursitis, left 02/27/2019  . Uterine mass 12/19/2018  . Allergic rhinitis with postnasal drip 11/17/2018  . Diverticulosis of colon without hemorrhage 06/23/2018  . Intractable migraine without aura and without status migrainosus 02/16/2018  . Iron deficiency anemia 03/29/2017  . Vitamin D deficiency 03/01/2017  . Lumbar herniated disc 06/06/2014  . Depression   . Prediabetes 01/14/2014  . Spinal stenosis, lumbar region, with neurogenic claudication 11/21/2013  . Hot flashes 06/30/2011  . Eczema, dyshidrotic 06/02/2010  . FIBROIDS, UTERUS 10/24/2009  . GERD 06/09/2009  . Chronic pain syndrome 10/04/2008  . Hepatitis C carrier ([place]) 03/21/2007  . Obesity 06/16/2006  . HYPERTENSION, BENIGN  SYSTEMIC 06/16/2006    Past Surgical History:  Procedure Laterality Date  . ANKLE SURGERY    . KNEE SURGERY Left   . LUMBAR LAMINECTOMY/DECOMPRESSION MICRODISCECTOMY Left 06/06/2014   Procedure: LUMBAR LAMINECTOMY/DECOMPRESSION MICRODISCECTOMY 1 LEVEL;  Surgeon: Newman Pies, MD;  Location: [facility];  Service: Neurosurgery;  Laterality: Left;  Left L5S1 microdiskectomy  . ROTATOR CUFF REPAIR Left 02/20/14  . TUBAL LIGATION      OB History    Gravida  4   Para  4   Term  4   Preterm      AB  0   Living  4     SAB  0   TAB  0   Ectopic  0   Multiple  0   Live Births  4            Home Medications    Prior to Admission medications   Medication Sig Start Date End Date Taking? Authorizing Provider  buPROPion (WELLBUTRIN XL) 300 MG 24 hr tablet Take 300 mg by mouth daily.   Yes [provider]  cyclobenzaprine (FLEXERIL) 10 MG tablet TAKE 1 TABLET BY MOUTH TWICE A DAY AS NEEDED FOR MUSCLE SPASMS 12/23/17  Yes Meccariello, Bernita Raisin, DO  diclofenac (VOLTAREN) 75 MG EC tablet Take 1 tablet (75 mg total) by mouth 2 (two) times daily. 11/14/19  Yes Milus Banister C, DO  gabapentin (NEURONTIN) 100  MG capsule Take 1 capsule (100 mg total) by mouth at bedtime. 05/25/19  Yes Meccariello, Bernita Raisin, DO  hydrochlorothiazide (HYDRODIURIL) 25 MG tablet Take 1 tablet (25 mg total) by mouth daily. 11/02/16  Yes Everrett Coombe, MD  metoprolol tartrate (LOPRESSOR) 25 MG tablet Take 1 tablet (25 mg total) by mouth 2 (two) times daily. 05/13/14  Yes [name], MD  omeprazole (PRILOSEC) 20 MG capsule Take 2 capsules (40 mg total) by mouth daily. 05/25/19  Yes Meccariello, Bernita Raisin, DO  oxyCODONE-acetaminophen (PERCOCET) 10-325 MG tablet Take 1 tablet by mouth 3 (three) times daily. 07/23/19  Yes [provider]  traZODone (DESYREL) 100 MG tablet TAKE 2 TABLETS BY MOUTH ONCE AT BEDTIME 05/31/19  Yes Meccariello, Bernita Raisin, DO  benzonatate (TESSALON) 100 MG capsule Take 1  capsule (100 mg total) by mouth every 8 (eight) hours. 01/30/20   Hall-Potvin, [place], PA-C  cetirizine (ZYRTEC ALLERGY) 10 MG tablet Take 1 tablet (10 mg total) by mouth daily. 01/30/20   Hall-Potvin, [place], PA-C  diclofenac Sodium (VOLTAREN) 1 % GEL Please apply 1-2 times daily to affected area 10/09/19   Lockamy, Timothy, DO  fluticasone (FLONASE) 50 MCG/ACT nasal spray Place 2 sprays into both nostrils daily. 10/15/19   Meccariello, Bernita Raisin, DO  fluticasone (FLONASE) 50 MCG/ACT nasal spray Place 1 spray into both nostrils daily. 01/30/20   Hall-Potvin, [place], PA-C  multivitamin-iron-minerals-folic acid (CENTRUM) chewable tablet Chew 1 tablet daily by mouth. 03/01/17   [name], MD  naproxen (NAPROSYN) 500 MG tablet Take 1 tablet (500 mg total) by mouth 2 (two) times daily with a meal. 10/09/19   Lockamy, Timothy, DO  Vitamin D, Ergocalciferol, (DRISDOL) 50000 units CAPS capsule TAKE 1 CAPSULE BY MOUTH EVERY 7 (SEVEN) DAYS. 12/09/17   Meccariello, Bernita Raisin, DO  ferrous sulfate 325 (65 FE) MG tablet Take 1 tablet (325 mg total) by mouth 2 (two) times daily with a meal. 02/22/19 04/14/19  Shirley, [place], DO  loratadine (CLARITIN) 10 MG tablet Take 1 tablet (10 mg total) by mouth daily. 04/11/19 08/12/19  Meccariello, Bernita Raisin, DO  traZODone (DESYREL) 100 MG tablet TAKE 2 TABLETS BY MOUTH ONCE AT BEDTIME 09/01/18   Daisy Floro, DO    Family History Family History  Problem Relation Age of Onset  . Diabetes Brother   . Diabetes Maternal Aunt   . Diabetes Paternal Aunt   . Diabetes Maternal Grandmother   . Diabetes Maternal Grandfather   . Diabetes Paternal Grandmother   . Diabetes Paternal Grandfather   . Congestive Heart Failure Mother   . Hypertension Son   . Colon cancer Neg Hx   . Esophageal cancer Neg Hx   . Rectal cancer Neg Hx   . Stomach cancer Neg Hx     Social History Social History   Tobacco Use  . Smoking status: Never Smoker  . Smokeless tobacco:  Never Used  Vaping Use  . Vaping Use: Never used  Substance Use Topics  . Alcohol use: Yes    Comment: glass of wine occasionally  . Drug use: No    Comment: States no longer uses marijuana     Allergies   Soma [carisoprodol], Nortriptyline, and Prednisone   Review of Systems As per HPI   Physical Exam Triage Vital Signs ED Triage Vitals  Enc Vitals Group     BP      Pulse      Resp      Temp  Temp src      SpO2      Weight      Height      Head Circumference      Peak Flow      Pain Score      Pain Loc      Pain Edu?      Excl. in [place]?    No data found.  Updated Vital Signs BP (!) 153/95 (BP Location: Left Arm)   Pulse 89   Temp 98.3 F (36.8 C) (Oral)   Resp 18   SpO2 95%   Visual Acuity Right Eye Distance:   Left Eye Distance:   Bilateral Distance:    Right Eye Near:   Left Eye Near:    Bilateral Near:     Physical Exam Constitutional:      General: She is not in acute distress.    Appearance: She is not ill-appearing or diaphoretic.  HENT:     Head: Normocephalic and atraumatic.     Mouth/Throat:     Mouth: Mucous membranes are moist.     Pharynx: Oropharynx is clear. No oropharyngeal exudate or posterior oropharyngeal erythema.  Eyes:     General: No scleral icterus.    Conjunctiva/sclera: Conjunctivae normal.     Pupils: Pupils are equal, round, and reactive to light.  Neck:     Comments: Trachea midline, negative JVD Cardiovascular:     Rate and Rhythm: Normal rate and regular rhythm.     Heart sounds: No murmur heard.  No gallop.   Pulmonary:     Effort: Pulmonary effort is normal. No respiratory distress.     Breath sounds: No wheezing, rhonchi or rales.  Musculoskeletal:     Cervical back: Neck supple. No tenderness.  Lymphadenopathy:     Cervical: No cervical adenopathy.  Skin:    Capillary Refill: Capillary refill takes less than 2 seconds.     Coloration: Skin is not jaundiced or pale.     Findings: No rash.    Neurological:     General: No focal deficit present.     Mental Status: She is alert and oriented to person, place, and time.      UC Treatments / Results  Labs (all labs ordered are listed, but only abnormal results are displayed) Labs Reviewed  NOVEL CORONAVIRUS, NAA    EKG   Radiology No results found.  Procedures Procedures (including critical care time)  Medications Ordered in UC Medications - No data to display  Initial Impression / Assessment and Plan / UC Course  I have reviewed the triage vital signs and the nursing notes.  Pertinent labs & imaging results that were available during my care of the patient were reviewed by me and considered in my medical decision making (see chart for details).     Patient afebrile, nontoxic, with SpO2 95%.  Covid PCR pending.  Patient to quarantine until results are back.  We will treat supportively as outlined below.  Return precautions discussed, patient verbalized understanding and is agreeable to plan. Final Clinical Impressions(s) / UC Diagnoses   Final diagnoses:  URI with cough and congestion     Discharge Instructions     Your COVID test is pending - it is important to quarantine / isolate at home until your results are back. If you test positive and would like further evaluation for persistent or worsening symptoms, you may schedule an E-visit or virtual (video) visit throughout the [place] or  website.  PLEASE NOTE: If you develop severe chest pain or shortness of breath please go to the ER or call [phone] for further evaluation --> DO NOT schedule electronic or virtual visits for this. Please call our office for further guidance / recommendations as needed.  For information about the Covid vaccine, please visit [email or web address]    ED Prescriptions    Medication Sig Dispense Auth. Provider   cetirizine (ZYRTEC ALLERGY) 10 MG tablet Take 1 tablet (10 mg total) by mouth daily. 30 tablet  Hall-Potvin, [place], PA-C   fluticasone (FLONASE) 50 MCG/ACT nasal spray Place 1 spray into both nostrils daily. 16 g Hall-Potvin, [place], PA-C   benzonatate (TESSALON) 100 MG capsule Take 1 capsule (100 mg total) by mouth every 8 (eight) hours. 21 capsule Hall-Potvin, [place], PA-C     PDMP not reviewed this encounter.   Hall-Potvin, [place], [place] 01/30/20 1424

## 2020-01-31 DIAGNOSIS — M19211 Secondary osteoarthritis, right shoulder: Secondary | ICD-10-CM | POA: Diagnosis not present

## 2020-01-31 DIAGNOSIS — M67911 Unspecified disorder of synovium and tendon, right shoulder: Secondary | ICD-10-CM | POA: Diagnosis not present

## 2020-01-31 LAB — SARS-COV-2, NAA 2 DAY TAT

## 2020-01-31 LAB — NOVEL CORONAVIRUS, NAA: SARS-CoV-2, NAA: NOT DETECTED

## 2020-02-05 DIAGNOSIS — Z9889 Other specified postprocedural states: Secondary | ICD-10-CM | POA: Diagnosis not present

## 2020-02-05 DIAGNOSIS — E119 Type 2 diabetes mellitus without complications: Secondary | ICD-10-CM | POA: Diagnosis not present

## 2020-02-05 DIAGNOSIS — M25552 Pain in left hip: Secondary | ICD-10-CM | POA: Diagnosis not present

## 2020-02-05 DIAGNOSIS — M5432 Sciatica, left side: Secondary | ICD-10-CM | POA: Diagnosis not present

## 2020-02-06 DIAGNOSIS — Z780 Asymptomatic menopausal state: Secondary | ICD-10-CM | POA: Diagnosis not present

## 2020-02-14 DIAGNOSIS — Z79899 Other long term (current) drug therapy: Secondary | ICD-10-CM | POA: Diagnosis not present

## 2020-02-14 DIAGNOSIS — M5442 Lumbago with sciatica, left side: Secondary | ICD-10-CM | POA: Diagnosis not present

## 2020-02-14 DIAGNOSIS — Z9181 History of falling: Secondary | ICD-10-CM | POA: Diagnosis not present

## 2020-02-14 DIAGNOSIS — G8929 Other chronic pain: Secondary | ICD-10-CM | POA: Diagnosis not present

## 2020-02-14 DIAGNOSIS — E559 Vitamin D deficiency, unspecified: Secondary | ICD-10-CM | POA: Diagnosis not present

## 2020-03-12 DIAGNOSIS — Z9181 History of falling: Secondary | ICD-10-CM | POA: Diagnosis not present

## 2020-03-12 DIAGNOSIS — M25532 Pain in left wrist: Secondary | ICD-10-CM | POA: Diagnosis not present

## 2020-03-12 DIAGNOSIS — M5442 Lumbago with sciatica, left side: Secondary | ICD-10-CM | POA: Diagnosis not present

## 2020-03-12 DIAGNOSIS — Z79899 Other long term (current) drug therapy: Secondary | ICD-10-CM | POA: Diagnosis not present

## 2020-03-12 DIAGNOSIS — G8929 Other chronic pain: Secondary | ICD-10-CM | POA: Diagnosis not present

## 2020-03-16 ENCOUNTER — Encounter: Payer: Self-pay | Admitting: Emergency Medicine

## 2020-03-16 ENCOUNTER — Other Ambulatory Visit: Payer: Self-pay

## 2020-03-16 ENCOUNTER — Encounter (HOSPITAL_COMMUNITY): Payer: Self-pay | Admitting: Emergency Medicine

## 2020-03-16 ENCOUNTER — Emergency Department (HOSPITAL_COMMUNITY): Payer: Medicare Other

## 2020-03-16 ENCOUNTER — Inpatient Hospital Stay (HOSPITAL_COMMUNITY)
Admission: EM | Admit: 2020-03-16 | Discharge: 2020-03-21 | DRG: 419 | Disposition: A | Discharge status: Home / Self Care | Payer: Medicare Other | Attending: Internal Medicine | Admitting: Internal Medicine

## 2020-03-16 ENCOUNTER — Ambulatory Visit
Admission: EM | Admit: 2020-03-16 | Discharge: 2020-03-16 | Disposition: A | Discharge status: Other Acute Inpatient Hospital | Payer: Medicare Other | Attending: Emergency Medicine | Admitting: Emergency Medicine

## 2020-03-16 DIAGNOSIS — R1011 Right upper quadrant pain: Secondary | ICD-10-CM

## 2020-03-16 DIAGNOSIS — Z833 Family history of diabetes mellitus: Secondary | ICD-10-CM | POA: Diagnosis not present

## 2020-03-16 DIAGNOSIS — R945 Abnormal results of liver function studies: Secondary | ICD-10-CM | POA: Diagnosis not present

## 2020-03-16 DIAGNOSIS — Z8249 Family history of ischemic heart disease and other diseases of the circulatory system: Secondary | ICD-10-CM

## 2020-03-16 DIAGNOSIS — K760 Fatty (change of) liver, not elsewhere classified: Secondary | ICD-10-CM | POA: Diagnosis not present

## 2020-03-16 DIAGNOSIS — B1920 Unspecified viral hepatitis C without hepatic coma: Secondary | ICD-10-CM | POA: Diagnosis present

## 2020-03-16 DIAGNOSIS — R768 Other specified abnormal immunological findings in serum: Secondary | ICD-10-CM | POA: Diagnosis not present

## 2020-03-16 DIAGNOSIS — R110 Nausea: Secondary | ICD-10-CM | POA: Diagnosis not present

## 2020-03-16 DIAGNOSIS — Z888 Allergy status to other drugs, medicaments and biological substances status: Secondary | ICD-10-CM

## 2020-03-16 DIAGNOSIS — R932 Abnormal findings on diagnostic imaging of liver and biliary tract: Secondary | ICD-10-CM | POA: Diagnosis not present

## 2020-03-16 DIAGNOSIS — R0689 Other abnormalities of breathing: Secondary | ICD-10-CM | POA: Diagnosis not present

## 2020-03-16 DIAGNOSIS — G8929 Other chronic pain: Secondary | ICD-10-CM | POA: Diagnosis present

## 2020-03-16 DIAGNOSIS — Z79899 Other long term (current) drug therapy: Secondary | ICD-10-CM | POA: Diagnosis not present

## 2020-03-16 DIAGNOSIS — Z79891 Long term (current) use of opiate analgesic: Secondary | ICD-10-CM | POA: Diagnosis not present

## 2020-03-16 DIAGNOSIS — Z20822 Contact with and (suspected) exposure to covid-19: Secondary | ICD-10-CM | POA: Diagnosis present

## 2020-03-16 DIAGNOSIS — K8050 Calculus of bile duct without cholangitis or cholecystitis without obstruction: Secondary | ICD-10-CM | POA: Diagnosis not present

## 2020-03-16 DIAGNOSIS — Z791 Long term (current) use of non-steroidal anti-inflammatories (NSAID): Secondary | ICD-10-CM | POA: Diagnosis not present

## 2020-03-16 DIAGNOSIS — I160 Hypertensive urgency: Secondary | ICD-10-CM | POA: Diagnosis present

## 2020-03-16 DIAGNOSIS — I4891 Unspecified atrial fibrillation: Secondary | ICD-10-CM | POA: Diagnosis present

## 2020-03-16 DIAGNOSIS — I10 Essential (primary) hypertension: Secondary | ICD-10-CM | POA: Diagnosis present

## 2020-03-16 DIAGNOSIS — F32A Depression, unspecified: Secondary | ICD-10-CM | POA: Diagnosis present

## 2020-03-16 DIAGNOSIS — R7989 Other specified abnormal findings of blood chemistry: Secondary | ICD-10-CM | POA: Diagnosis not present

## 2020-03-16 DIAGNOSIS — R748 Abnormal levels of other serum enzymes: Secondary | ICD-10-CM | POA: Diagnosis not present

## 2020-03-16 DIAGNOSIS — K828 Other specified diseases of gallbladder: Secondary | ICD-10-CM | POA: Diagnosis not present

## 2020-03-16 DIAGNOSIS — Z6838 Body mass index (BMI) 38.0-38.9, adult: Secondary | ICD-10-CM

## 2020-03-16 DIAGNOSIS — F419 Anxiety disorder, unspecified: Secondary | ICD-10-CM | POA: Diagnosis present

## 2020-03-16 DIAGNOSIS — K8021 Calculus of gallbladder without cholecystitis with obstruction: Secondary | ICD-10-CM | POA: Diagnosis not present

## 2020-03-16 DIAGNOSIS — K219 Gastro-esophageal reflux disease without esophagitis: Secondary | ICD-10-CM | POA: Diagnosis present

## 2020-03-16 DIAGNOSIS — K8020 Calculus of gallbladder without cholecystitis without obstruction: Secondary | ICD-10-CM

## 2020-03-16 DIAGNOSIS — K8010 Calculus of gallbladder with chronic cholecystitis without obstruction: Principal | ICD-10-CM | POA: Diagnosis present

## 2020-03-16 DIAGNOSIS — K7460 Unspecified cirrhosis of liver: Secondary | ICD-10-CM | POA: Diagnosis not present

## 2020-03-16 DIAGNOSIS — R1084 Generalized abdominal pain: Secondary | ICD-10-CM | POA: Diagnosis not present

## 2020-03-16 DIAGNOSIS — E119 Type 2 diabetes mellitus without complications: Secondary | ICD-10-CM | POA: Diagnosis not present

## 2020-03-16 DIAGNOSIS — R109 Unspecified abdominal pain: Secondary | ICD-10-CM | POA: Diagnosis present

## 2020-03-16 DIAGNOSIS — R52 Pain, unspecified: Secondary | ICD-10-CM | POA: Diagnosis not present

## 2020-03-16 DIAGNOSIS — K838 Other specified diseases of biliary tract: Secondary | ICD-10-CM | POA: Diagnosis not present

## 2020-03-16 DIAGNOSIS — N6002 Solitary cyst of left breast: Secondary | ICD-10-CM | POA: Diagnosis not present

## 2020-03-16 DIAGNOSIS — K8001 Calculus of gallbladder with acute cholecystitis with obstruction: Secondary | ICD-10-CM | POA: Diagnosis not present

## 2020-03-16 DIAGNOSIS — E669 Obesity, unspecified: Secondary | ICD-10-CM | POA: Diagnosis present

## 2020-03-16 HISTORY — DX: Unspecified atrial fibrillation: I48.91

## 2020-03-16 HISTORY — DX: Cardiac arrhythmia, unspecified: I49.9

## 2020-03-16 LAB — URINALYSIS, ROUTINE W REFLEX MICROSCOPIC
Bacteria, UA: NONE SEEN
Bilirubin Urine: NEGATIVE
Glucose, UA: NEGATIVE mg/dL
Hgb urine dipstick: NEGATIVE
Ketones, ur: NEGATIVE mg/dL
Nitrite: NEGATIVE
Protein, ur: 30 mg/dL — AB
Specific Gravity, Urine: 1.018 (ref 1.005–1.030)
pH: 9 — ABNORMAL HIGH (ref 5.0–8.0)

## 2020-03-16 LAB — CBC
HCT: 42 % (ref 36.0–46.0)
Hemoglobin: 13.4 g/dL (ref 12.0–15.0)
MCH: 27.9 pg (ref 26.0–34.0)
MCHC: 31.9 g/dL (ref 30.0–36.0)
MCV: 87.3 fL (ref 80.0–100.0)
Platelets: 211 10*3/uL (ref 150–400)
RBC: 4.81 MIL/uL (ref 3.87–5.11)
RDW: 13.8 % (ref 11.5–15.5)
WBC: 7.8 10*3/uL (ref 4.0–10.5)
nRBC: 0 % (ref 0.0–0.2)

## 2020-03-16 LAB — COMPREHENSIVE METABOLIC PANEL
ALT: 22 U/L (ref 0–44)
AST: 31 U/L (ref 15–41)
Albumin: 4.2 g/dL (ref 3.5–5.0)
Alkaline Phosphatase: 63 U/L (ref 38–126)
Anion gap: 9 (ref 5–15)
BUN: 12 mg/dL (ref 8–23)
CO2: 25 mmol/L (ref 22–32)
Calcium: 9.4 mg/dL (ref 8.9–10.3)
Chloride: 104 mmol/L (ref 98–111)
Creatinine, Ser: 0.6 mg/dL (ref 0.44–1.00)
GFR, Estimated: >60 mL/min (ref >60)
Glucose, Bld: 111 mg/dL — ABNORMAL HIGH (ref 70–99)
Potassium: 3.6 mmol/L (ref 3.5–5.1)
Sodium: 138 mmol/L (ref 135–145)
Total Bilirubin: 0.8 mg/dL (ref 0.3–1.2)
Total Protein: 8 g/dL (ref 6.5–8.1)

## 2020-03-16 LAB — LIPASE, BLOOD: Lipase: 34 U/L (ref 11–51)

## 2020-03-16 LAB — RESP PANEL BY RT-PCR (FLU A&B, COVID) ARPGX2
Influenza A by PCR: NEGATIVE
Influenza B by PCR: NEGATIVE
SARS Coronavirus 2 by RT PCR: NEGATIVE

## 2020-03-16 MED ORDER — HYDRALAZINE HCL 20 MG/ML IJ SOLN: 5.0000 mg | INTRAMUSCULAR | Status: DC | PRN

## 2020-03-16 MED ORDER — ONDANSETRON HCL 4 MG/2ML IJ SOLN: 4.0000 mg | Freq: Four times a day (QID) | INTRAMUSCULAR | Status: DC | PRN

## 2020-03-16 MED ORDER — MORPHINE SULFATE (PF) 4 MG/ML IV SOLN
4.0000 mg | Freq: Once | INTRAVENOUS | Status: AC
  Administered 2020-03-16: 4 mg via INTRAVENOUS
  Filled 2020-03-16: qty 1

## 2020-03-16 MED ORDER — ALUM & MAG HYDROXIDE-SIMETH 200-200-20 MG/5ML PO SUSP
30.0000 mL | Freq: Once | ORAL | Status: AC
  Administered 2020-03-16: 30 mL via ORAL

## 2020-03-16 MED ORDER — ACETAMINOPHEN 650 MG RE SUPP: 650.0000 mg | Freq: Four times a day (QID) | RECTAL | Status: DC | PRN

## 2020-03-16 MED ORDER — SODIUM CHLORIDE 0.9 % IV SOLN: INTRAVENOUS | Status: AC

## 2020-03-16 MED ORDER — LIDOCAINE VISCOUS HCL 2 % MT SOLN
15.0000 mL | Freq: Once | OROMUCOSAL | Status: AC
  Administered 2020-03-16: 15 mL via ORAL

## 2020-03-16 MED ORDER — ONDANSETRON 4 MG PO TBDP
4.0000 mg | ORAL_TABLET | Freq: Once | ORAL | Status: AC
  Administered 2020-03-16: 4 mg via ORAL

## 2020-03-16 MED ORDER — METRONIDAZOLE IN NACL 5-0.79 MG/ML-% IV SOLN
500.0000 mg | Freq: Once | INTRAVENOUS | Status: DC
  Filled 2020-03-16: qty 100

## 2020-03-16 MED ORDER — MORPHINE SULFATE (PF) 2 MG/ML IV SOLN
1.0000 mg | INTRAVENOUS | Status: DC | PRN
  Administered 2020-03-16 – 2020-03-17 (×3): 1 mg via INTRAVENOUS
  Filled 2020-03-16 (×3): qty 1

## 2020-03-16 MED ORDER — IOHEXOL 300 MG/ML  SOLN
100.0000 mL | Freq: Once | INTRAMUSCULAR | Status: AC | PRN
  Administered 2020-03-16: 100 mL via INTRAVENOUS

## 2020-03-16 MED ORDER — SODIUM CHLORIDE 0.9 % IV SOLN
2.0000 g | Freq: Once | INTRAVENOUS | Status: DC
  Administered 2020-03-16: 2 g via INTRAVENOUS
  Filled 2020-03-16: qty 20

## 2020-03-16 MED ORDER — PIPERACILLIN-TAZOBACTAM 3.375 G IVPB 30 MIN
3.3750 g | Freq: Once | INTRAVENOUS | Status: AC
  Administered 2020-03-16: 3.375 g via INTRAVENOUS
  Filled 2020-03-16: qty 50

## 2020-03-16 MED ORDER — HYDROMORPHONE HCL 1 MG/ML IJ SOLN
1.0000 mg | Freq: Once | INTRAMUSCULAR | Status: AC
  Administered 2020-03-16: 1 mg via INTRAVENOUS
  Filled 2020-03-16: qty 1

## 2020-03-16 MED ORDER — FENTANYL CITRATE (PF) 100 MCG/2ML IJ SOLN
50.0000 ug | Freq: Once | INTRAMUSCULAR | Status: AC
  Administered 2020-03-16: 50 ug via INTRAVENOUS
  Filled 2020-03-16: qty 2

## 2020-03-16 MED ORDER — ACETAMINOPHEN 325 MG PO TABS
650.0000 mg | ORAL_TABLET | Freq: Four times a day (QID) | ORAL | Status: DC | PRN
  Administered 2020-03-17 – 2020-03-19 (×3): 650 mg via ORAL
  Filled 2020-03-16 (×3): qty 2

## 2020-03-16 MED ORDER — INSULIN ASPART 100 UNIT/ML ~~LOC~~ SOLN
0.0000 [IU] | SUBCUTANEOUS | Status: DC
  Filled 2020-03-16: qty 0.09

## 2020-03-16 MED ORDER — ONDANSETRON HCL 4 MG/2ML IJ SOLN
4.0000 mg | Freq: Once | INTRAMUSCULAR | Status: AC
  Administered 2020-03-16: 4 mg via INTRAVENOUS
  Filled 2020-03-16: qty 2

## 2020-03-16 NOTE — ED Notes (Signed)
Report given to [facility], Therapist, sports.  PT SBAR information covered in detail.  No additional question were asked at this time.  Pt resting quietly in bed.  Will continue to monitor.

## 2020-03-16 NOTE — ED Notes (Signed)
Pt in bed on phone

## 2020-03-16 NOTE — H&P (Signed)
History and Physical    CHRISHELLE ZITO GYI:[id number] DOB: 03-16-1961 DOA: 03/16/2020  PCP: [name], NP Patient coming from: Home  Chief Complaint: Abdominal pain  HPI: Norma [name] is a 59 y.o. female with medical history significant of type 2 diabetes not on meds, hypertension, depression, anxiety, GERD, chronic pain presenting with complaints of abdominal pain.  Patient states he normally never gets abdominal pain after eating.  However, Friday night after eating dinner she experienced cramping generalized abdominal pain.  Today she is feeling pain mostly in her right upper quadrant.  She describes it as a cramping sensation that is constant.  She has felt nauseous but has not vomited.  Denies history of any gallbladder problems.  Denies fevers or chills.  She has been vaccinated against Covid.  Denies shortness of breath, cough, and chest pain.  ED Course: Blood pressure significantly elevated with systolic above 350, remainder of vital signs stable.  WBC 7.8, hemoglobin 13.4, hematocrit 42.0, platelet 211K.  Sodium 138, potassium 3.6, chloride 104, bicarb 25, BUN 12, creatinine 0.6, glucose 111.  Lipase and LFTs normal.  UA with negative nitrite, trace amount of leukocytes, 6-10 WBCs, and no bacteria.  Screening SARS-CoV-2 PCR test and influenza panel pending.  CT abdomen pelvis showing findings concerning for acalculous acute cholecystitis.  Stable left adnexal cyst, likely ovarian in origin.  Further correlation with pelvic ultrasound recommended.  Right upper quadrant abdominal ultrasound showing cholelithiasis and gallbladder sludge with no findings to suggest acute cholecystitis.  CBD diameter at the upper limit of normal/borderline enlarged at 8 mm with choledocholithiasis more distally not excluded.  Also showing evidence of hepatic steatosis.  Patient was given fentanyl, Dilaudid, morphine, Zofran, Zosyn, and ceftriaxone.  ED provider discussed the case with on-call physician  for [facility] who recommended obtaining a HIDA scan and if there is concern for cholecystitis/biliary colic then consult general surgery in the morning.  ERCP not recommended as LFTs are normal.  GI will see the patient in the morning.  Review of Systems:  All systems reviewed and apart from history of presenting illness, are negative.  Past Medical History:  Diagnosis Date  . Acid reflux   . Allergy   . Anemia   . Anxiety   . Arthritis   . Back pain   . Depression   . Diabetes mellitus without complication ([place])    on meds  . Headache    stress headaches, migraines at times  . Heart murmur   . Hypertension   . Irregular heart beat   . Left shoulder pain   . Shortness of breath dyspnea    with exertion  . Tachyarrhythmia   . Trigger point of thoracic region 03/22/2012    Past Surgical History:  Procedure Laterality Date  . ANKLE SURGERY    . KNEE SURGERY Left   . LUMBAR LAMINECTOMY/DECOMPRESSION MICRODISCECTOMY Left 06/06/2014   Procedure: LUMBAR LAMINECTOMY/DECOMPRESSION MICRODISCECTOMY 1 LEVEL;  Surgeon: Newman Pies, MD;  Location: [facility];  Service: Neurosurgery;  Laterality: Left;  Left L5S1 microdiskectomy  . ROTATOR CUFF REPAIR Left 02/20/14  . TUBAL LIGATION       reports that she has never smoked. She has never used smokeless tobacco. She reports current alcohol use. She reports that she does not use drugs.  Allergies  Allergen Reactions  . Soma [Carisoprodol] Itching    Itching/rash around mouth, itching back of throat.   . Nortriptyline Rash  . Prednisone Rash    REACTION: rash  and itching.     Family History  Problem Relation Age of Onset  . Diabetes Brother   . Diabetes Maternal Aunt   . Diabetes Paternal Aunt   . Diabetes Maternal Grandmother   . Diabetes Maternal Grandfather   . Diabetes Paternal Grandmother   . Diabetes Paternal Grandfather   . Congestive Heart Failure Mother   . Hypertension Son   . Colon cancer Neg Hx   . Esophageal  cancer Neg Hx   . Rectal cancer Neg Hx   . Stomach cancer Neg Hx     Prior to Admission medications   Medication Sig Start Date End Date Taking? Authorizing Provider  benzonatate (TESSALON) 100 MG capsule Take 1 capsule (100 mg total) by mouth every 8 (eight) hours. 01/30/20   Hall-Potvin, [place], PA-C  buPROPion (WELLBUTRIN XL) 300 MG 24 hr tablet Take 300 mg by mouth daily.    [provider]  cetirizine (ZYRTEC ALLERGY) 10 MG tablet Take 1 tablet (10 mg total) by mouth daily. 01/30/20   Hall-Potvin, [place], PA-C  cyclobenzaprine (FLEXERIL) 10 MG tablet TAKE 1 TABLET BY MOUTH TWICE A DAY AS NEEDED FOR MUSCLE SPASMS 12/23/17   Meccariello, Bernita Raisin, DO  diclofenac (VOLTAREN) 75 MG EC tablet Take 1 tablet (75 mg total) by mouth 2 (two) times daily. 11/14/19   Daisy Floro, DO  diclofenac Sodium (VOLTAREN) 1 % GEL Please apply 1-2 times daily to affected area 10/09/19   Lockamy, Timothy, DO  fluticasone (FLONASE) 50 MCG/ACT nasal spray Place 2 sprays into both nostrils daily. 10/15/19   Meccariello, Bernita Raisin, DO  fluticasone (FLONASE) 50 MCG/ACT nasal spray Place 1 spray into both nostrils daily. 01/30/20   Hall-Potvin, [place], PA-C  gabapentin (NEURONTIN) 100 MG capsule Take 1 capsule (100 mg total) by mouth at bedtime. 05/25/19   Meccariello, Bernita Raisin, DO  hydrochlorothiazide (HYDRODIURIL) 25 MG tablet Take 1 tablet (25 mg total) by mouth daily. 11/02/16   Everrett Coombe, MD  metoprolol tartrate (LOPRESSOR) 25 MG tablet Take 1 tablet (25 mg total) by mouth 2 (two) times daily. 05/13/14   [name], MD  multivitamin-iron-minerals-folic acid (CENTRUM) chewable tablet Chew 1 tablet daily by mouth. 03/01/17   [name], MD  naproxen (NAPROSYN) 500 MG tablet Take 1 tablet (500 mg total) by mouth 2 (two) times daily with a meal. 10/09/19   Lockamy, Timothy, DO  omeprazole (PRILOSEC) 20 MG capsule Take 2 capsules (40 mg total) by mouth daily. 05/25/19   Meccariello, Bernita Raisin, DO    oxyCODONE-acetaminophen (PERCOCET) 10-325 MG tablet Take 1 tablet by mouth 3 (three) times daily. 07/23/19   [provider]  traZODone (DESYREL) 100 MG tablet TAKE 2 TABLETS BY MOUTH ONCE AT BEDTIME 05/31/19   Meccariello, Bernita Raisin, DO  Vitamin D, Ergocalciferol, (DRISDOL) 50000 units CAPS capsule TAKE 1 CAPSULE BY MOUTH EVERY 7 (SEVEN) DAYS. 12/09/17   Meccariello, Bernita Raisin, DO  ferrous sulfate 325 (65 FE) MG tablet Take 1 tablet (325 mg total) by mouth 2 (two) times daily with a meal. 02/22/19 04/14/19  Shirley, [place], DO  loratadine (CLARITIN) 10 MG tablet Take 1 tablet (10 mg total) by mouth daily. 04/11/19 08/12/19  Meccariello, Bernita Raisin, DO  traZODone (DESYREL) 100 MG tablet TAKE 2 TABLETS BY MOUTH ONCE AT BEDTIME 09/01/18   Daisy Floro, DO    Physical Exam: Vitals:   03/16/20 1933 03/16/20 2011 03/16/20 2115 03/16/20 2144  BP: (!) 211/136 (!) 154/85  (!) 165/80  Pulse: 100 (!) 101 (!) 109 (!) 102  Resp: 18 20  18   Temp:    98.5 F (36.9 C)  TempSrc:    Oral  SpO2: 99% 95% 94% 97%    Physical Exam Constitutional:      General: She is not in acute distress. HENT:     Head: Normocephalic and atraumatic.  Eyes:     Extraocular Movements: Extraocular movements intact.     Conjunctiva/sclera: Conjunctivae normal.  Cardiovascular:     Rate and Rhythm: Normal rate and regular rhythm.     Pulses: Normal pulses.  Pulmonary:     Effort: Pulmonary effort is normal. No respiratory distress.     Breath sounds: Normal breath sounds. No wheezing or rales.  Abdominal:     General: Bowel sounds are normal.     Palpations: Abdomen is soft.     Tenderness: There is abdominal tenderness. There is no guarding or rebound.     Comments: Right upper quadrant tender to palpation  Musculoskeletal:        General: No swelling or tenderness.     Cervical back: Normal range of motion and neck supple.  Skin:    General: Skin is warm and dry.  Neurological:     General: No focal  deficit present.     Mental Status: She is alert and oriented to person, place, and time.     Labs on Admission: I have personally reviewed following labs and imaging studies  CBC: Recent Labs  Lab 03/16/20 1648  WBC 7.8  HGB 13.4  HCT 42.0  MCV 87.3  PLT 371   Basic Metabolic Panel: Recent Labs  Lab 03/16/20 1648  NA 138  K 3.6  CL 104  CO2 25  GLUCOSE 111*  BUN 12  CREATININE 0.60  CALCIUM 9.4   GFR: CrCl cannot be calculated (Unknown ideal weight.). Liver Function Tests: Recent Labs  Lab 03/16/20 1648  AST 31  ALT 22  ALKPHOS 63  BILITOT 0.8  PROT 8.0  ALBUMIN 4.2   Recent Labs  Lab 03/16/20 1648  LIPASE 34   No results for input(s): AMMONIA in the last 168 hours. Coagulation Profile: No results for input(s): INR, PROTIME in the last 168 hours. Cardiac Enzymes: No results for input(s): CKTOTAL, CKMB, CKMBINDEX, TROPONINI in the last 168 hours. BNP (last 3 results) No results for input(s): PROBNP in the last 8760 hours. HbA1C: No results for input(s): HGBA1C in the last 72 hours. CBG: No results for input(s): GLUCAP in the last 168 hours. Lipid Profile: No results for input(s): CHOL, HDL, LDLCALC, TRIG, CHOLHDL, LDLDIRECT in the last 72 hours. Thyroid Function Tests: No results for input(s): TSH, T4TOTAL, FREET4, T3FREE, THYROIDAB in the last 72 hours. Anemia Panel: No results for input(s): VITAMINB12, FOLATE, FERRITIN, TIBC, IRON, RETICCTPCT in the last 72 hours. Urine analysis:    Component Value Date/Time   COLORURINE YELLOW 03/16/2020 1648   APPEARANCEUR CLEAR 03/16/2020 1648   LABSPEC 1.018 03/16/2020 1648   PHURINE 9.0 (H) 03/16/2020 1648   GLUCOSEU NEGATIVE 03/16/2020 1648   HGBUR NEGATIVE 03/16/2020 1648   HGBUR negative 10/10/2009 [place] 03/16/2020 1648   BILIRUBINUR NEG 08/27/2014 1134   KETONESUR NEGATIVE 03/16/2020 1648   PROTEINUR 30 (A) 03/16/2020 1648   UROBILINOGEN 0.2 08/12/2019 1738   NITRITE  NEGATIVE 03/16/2020 1648   LEUKOCYTESUR TRACE (A) 03/16/2020 1648    Radiological Exams on Admission: CT ABDOMEN PELVIS W CONTRAST  Result Date: 03/16/2020  CLINICAL DATA:  Right upper quadrant pain. EXAM: CT ABDOMEN AND PELVIS WITH CONTRAST TECHNIQUE: Multidetector CT imaging of the abdomen and pelvis was performed using the standard protocol following bolus administration of intravenous contrast. CONTRAST:  149mL OMNIPAQUE IOHEXOL 300 MG/ML  SOLN COMPARISON:  March 26, 2017 FINDINGS: Lower chest: Very mild atelectasis is seen within the bilateral lung bases. Hepatobiliary: No focal liver abnormality is seen. The gallbladder is moderately distended. No gallstones are identified. Mild pericholecystic inflammatory fat stranding is seen. Pancreas: Unremarkable. No pancreatic ductal dilatation or surrounding inflammatory changes. Spleen: Normal in size without focal abnormality. Adrenals/Urinary Tract: Adrenal glands are unremarkable. Kidneys are normal, without renal calculi, focal lesion, or hydronephrosis. Bladder is unremarkable. Stomach/Bowel: Stomach is within normal limits. The appendix is not clearly identified. No evidence of bowel wall thickening, distention, or inflammatory changes. Vascular/Lymphatic: No significant vascular findings are present. No enlarged abdominal or pelvic lymph nodes. Reproductive: The uterus is normal in size and appearance. A stable 4.3 cm x 5.2 cm lobulated area of low attenuation is seen within the posterior aspect of the left adnexa. Other: No abdominal wall hernia or abnormality. No abdominopelvic ascites. Musculoskeletal: No acute or significant osseous findings. IMPRESSION: 1. Findings consistent with acalculous acute cholecystitis. Correlation with right upper quadrant ultrasound is recommended. 2. Stable left adnexal cyst, likely ovarian in origin. Further correlation with pelvic ultrasound is recommended. Electronically Signed   By: Virgina Norfolk M.D.   On:  03/16/2020 18:31   US Abdomen Limited RUQ (LIVER/GB)  Result Date: 03/16/2020 CLINICAL DATA:  Biliary colic symptoms, hypertension, diabetes EXAM: ULTRASOUND ABDOMEN LIMITED RIGHT UPPER QUADRANT COMPARISON:  CT abdomen pelvis 03/27/2017. FINDINGS: Gallbladder: Calcified stone and sludge noted within the gallbladder lumen. No pericholecystic fluid or wall thickening visualized. No sonographic Murphy sign noted by sonographer. Common bile duct: Diameter: 8 mm-upper limits of normal/borderline enlarged Liver: No focal lesion identified. Increased in parenchymal echogenicity. Portal vein is patent on color Doppler imaging with normal direction of blood flow towards the liver. Other: None. IMPRESSION: 1. Cholelithiasis and gallbladder sludge with no findings to suggest acute cholecystitis. 2. The common bile duct measures at the upper limits of normal/borderline enlarged (8 mm) with choledocholithiasis more distally not excluded. 3. Hepatic steatosis. Please note limited evaluation for focal hepatic masses in a patient with hepatic steatosis due to decreased penetration of the acoustic ultrasound waves. Electronically Signed   By: Iven Finn M.D.   On: 03/16/2020 19:46    Assessment/Plan Principal Problem:   Cholelithiasis Active Problems:   Depression   Abdominal pain   Hypertensive urgency   Type 2 diabetes mellitus (HCC)   Right upper quadrant abdominal pain Cholelithiasis Lipase and LFTs normal.  No fever, leukocytosis, or signs of sepsis.  Ultrasound showing cholelithiasis and gallbladder sludge with no findings to suggest acute cholecystitis.  CBD diameter at the upper limit of normal/borderline enlarged at 8 mm with choledocholithiasis more distally not excluded.  ED provider discussed the case with on-call physician for [facility] who recommended obtaining a HIDA scan and if there is concern for cholecystitis or biliary colic then consult general surgery in the morning.  ERCP not  recommended as LFTs are normal.  GI will see the patient in the morning. -HIDA scan ordered.  Keep n.p.o., IV fluid hydration.  Morphine as needed for pain.  Antiemetic as needed.  Type 2 diabetes Not on medications. -Check A1c.  Sliding scale insulin sensitive every 4 hours as patient is currently n.p.o.  Hypertensive urgency  Blood pressure significantly elevated systolic above 865 initially, pain likely contributing as blood pressure now improved with systolic in the 784O after pain meds and no antihypertensives administered in the ED. -IV hydralazine PRN SBP >180.  Resume home meds after pharmacy med rec is done.  Abnormal urinalysis UA with negative nitrite, trace amount of leukocytes, 6-10 WBCs, and no bacteria.  Patient is not endorsing any UTI symptoms. -Urine culture ordered  Left adnexal cyst Ultrasound showing stable left adnexal cyst.  Per chart review, she was seen by OB/GYN in September 2020 for an endometrial mass; biopsy revealed atrophic endometrium and benign endometrial polyp; no atypia or malignancy. -Please ensure outpatient OB/GYN follow-up.  Depression, anxiety -Resume home meds after pharmacy med rec is done.  DVT prophylaxis: SCDs Code Status: Full code Family Communication: No family available at this time. Disposition Plan: Status is: Inpatient  Remains inpatient appropriate because:Ongoing active pain requiring inpatient pain management, Ongoing diagnostic testing needed not appropriate for outpatient work up and Inpatient level of care appropriate due to severity of illness   Dispo: The patient is from: Home              Anticipated d/c is to: Home              Anticipated d/c date is: 3 days              Patient currently is not medically stable to d/c.  The medical decision making on this patient was of high complexity and the patient is at high risk for clinical deterioration, therefore this is a level 3 visit.  Shela Leff MD [facility]  If 7PM-7AM, please contact night-coverage [email or web address]  03/16/2020, 9:48 PM

## 2020-03-16 NOTE — ED Notes (Signed)
Patient in bed guarding abd crying with abd pain

## 2020-03-16 NOTE — ED Triage Notes (Signed)
Patient BIBA c/o RUQ abdominal pain since Friday. Was seen earlier at urgent care and given GI cocktail + zofran w/ no relief. No diarrhea or vomiting.   Given 200 mcg fentanyl w/ EMS 18 L AC   BP 150/100 HR 90

## 2020-03-16 NOTE — ED Notes (Signed)
MD in room and aware of BP

## 2020-03-16 NOTE — ED Provider Notes (Signed)
[facility]    CSN: [id number] Arrival date & time: 03/16/20  1442      History   Chief Complaint Chief Complaint  Patient presents with  . Abdominal Pain    HPI Norma [name] is a 59 y.o. female  With extensive history as below presenting for 3-day course of RUQ pain.  States that became significantly worse today.  Denying nausea, vomiting, chest pain or difficulty breathing.  Has tried numerous OTC medications for both pain and GERD without relief.  Overall history limited secondary to patient cooperation due to pain.  Past Medical History:  Diagnosis Date  . Acid reflux   . Allergy   . Anemia   . Anxiety   . Arthritis   . Back pain   . Depression   . Diabetes mellitus without complication ([place])    on meds  . Headache    stress headaches, migraines at times  . Heart murmur   . Hypertension   . Irregular heart beat   . Left shoulder pain   . Shortness of breath dyspnea    with exertion  . Tachyarrhythmia   . Trigger point of thoracic region 03/22/2012    Patient Active Problem List   Diagnosis Date Noted  . Lead exposure 11/14/2019  . TMJ (temporomandibular joint disorder) 07/31/2019  . Greater trochanteric bursitis, left 02/27/2019  . Uterine mass 12/19/2018  . Allergic rhinitis with postnasal drip 11/17/2018  . Diverticulosis of colon without hemorrhage 06/23/2018  . Intractable migraine without aura and without status migrainosus 02/16/2018  . Iron deficiency anemia 03/29/2017  . Vitamin D deficiency 03/01/2017  . Lumbar herniated disc 06/06/2014  . Depression   . Prediabetes 01/14/2014  . Spinal stenosis, lumbar region, with neurogenic claudication 11/21/2013  . Hot flashes 06/30/2011  . Eczema, dyshidrotic 06/02/2010  . FIBROIDS, UTERUS 10/24/2009  . GERD 06/09/2009  . Chronic pain syndrome 10/04/2008  . Hepatitis C carrier ([place]) 03/21/2007  . Obesity 06/16/2006  . HYPERTENSION, BENIGN SYSTEMIC 06/16/2006    Past Surgical  History:  Procedure Laterality Date  . ANKLE SURGERY    . KNEE SURGERY Left   . LUMBAR LAMINECTOMY/DECOMPRESSION MICRODISCECTOMY Left 06/06/2014   Procedure: LUMBAR LAMINECTOMY/DECOMPRESSION MICRODISCECTOMY 1 LEVEL;  Surgeon: Newman Pies, MD;  Location: [facility];  Service: Neurosurgery;  Laterality: Left;  Left L5S1 microdiskectomy  . ROTATOR CUFF REPAIR Left 02/20/14  . TUBAL LIGATION      OB History    Gravida  4   Para  4   Term  4   Preterm      AB  0   Living  4     SAB  0   TAB  0   Ectopic  0   Multiple  0   Live Births  4            Home Medications    Prior to Admission medications   Medication Sig Start Date End Date Taking? Authorizing Provider  benzonatate (TESSALON) 100 MG capsule Take 1 capsule (100 mg total) by mouth every 8 (eight) hours. 01/30/20   Hall-Potvin, [place], PA-C  buPROPion (WELLBUTRIN XL) 300 MG 24 hr tablet Take 300 mg by mouth daily.    [provider]  cetirizine (ZYRTEC ALLERGY) 10 MG tablet Take 1 tablet (10 mg total) by mouth daily. 01/30/20   Hall-Potvin, [place], PA-C  cyclobenzaprine (FLEXERIL) 10 MG tablet TAKE 1 TABLET BY MOUTH TWICE A DAY AS NEEDED FOR MUSCLE SPASMS 12/23/17  Meccariello, Bernita Raisin, DO  diclofenac (VOLTAREN) 75 MG EC tablet Take 1 tablet (75 mg total) by mouth 2 (two) times daily. 11/14/19   Daisy Floro, DO  diclofenac Sodium (VOLTAREN) 1 % GEL Please apply 1-2 times daily to affected area 10/09/19   Lockamy, Timothy, DO  fluticasone (FLONASE) 50 MCG/ACT nasal spray Place 2 sprays into both nostrils daily. 10/15/19   Meccariello, Bernita Raisin, DO  fluticasone (FLONASE) 50 MCG/ACT nasal spray Place 1 spray into both nostrils daily. 01/30/20   Hall-Potvin, [place], PA-C  gabapentin (NEURONTIN) 100 MG capsule Take 1 capsule (100 mg total) by mouth at bedtime. 05/25/19   Meccariello, Bernita Raisin, DO  hydrochlorothiazide (HYDRODIURIL) 25 MG tablet Take 1 tablet (25 mg total) by mouth daily. 11/02/16    Everrett Coombe, MD  metoprolol tartrate (LOPRESSOR) 25 MG tablet Take 1 tablet (25 mg total) by mouth 2 (two) times daily. 05/13/14   [name], MD  multivitamin-iron-minerals-folic acid (CENTRUM) chewable tablet Chew 1 tablet daily by mouth. 03/01/17   [name], MD  naproxen (NAPROSYN) 500 MG tablet Take 1 tablet (500 mg total) by mouth 2 (two) times daily with a meal. 10/09/19   Lockamy, Timothy, DO  omeprazole (PRILOSEC) 20 MG capsule Take 2 capsules (40 mg total) by mouth daily. 05/25/19   Meccariello, Bernita Raisin, DO  oxyCODONE-acetaminophen (PERCOCET) 10-325 MG tablet Take 1 tablet by mouth 3 (three) times daily. 07/23/19   [provider]  traZODone (DESYREL) 100 MG tablet TAKE 2 TABLETS BY MOUTH ONCE AT BEDTIME 05/31/19   Meccariello, Bernita Raisin, DO  Vitamin D, Ergocalciferol, (DRISDOL) 50000 units CAPS capsule TAKE 1 CAPSULE BY MOUTH EVERY 7 (SEVEN) DAYS. 12/09/17   Meccariello, Bernita Raisin, DO  ferrous sulfate 325 (65 FE) MG tablet Take 1 tablet (325 mg total) by mouth 2 (two) times daily with a meal. 02/22/19 04/14/19  Shirley, [place], DO  loratadine (CLARITIN) 10 MG tablet Take 1 tablet (10 mg total) by mouth daily. 04/11/19 08/12/19  Meccariello, Bernita Raisin, DO  traZODone (DESYREL) 100 MG tablet TAKE 2 TABLETS BY MOUTH ONCE AT BEDTIME 09/01/18   Daisy Floro, DO    Family History Family History  Problem Relation Age of Onset  . Diabetes Brother   . Diabetes Maternal Aunt   . Diabetes Paternal Aunt   . Diabetes Maternal Grandmother   . Diabetes Maternal Grandfather   . Diabetes Paternal Grandmother   . Diabetes Paternal Grandfather   . Congestive Heart Failure Mother   . Hypertension Son   . Colon cancer Neg Hx   . Esophageal cancer Neg Hx   . Rectal cancer Neg Hx   . Stomach cancer Neg Hx     Social History Social History   Tobacco Use  . Smoking status: Never Smoker  . Smokeless tobacco: Never Used  Vaping Use  . Vaping Use: Never used  Substance Use  Topics  . Alcohol use: Yes    Comment: glass of wine occasionally  . Drug use: No    Comment: States no longer uses marijuana     Allergies   Soma [carisoprodol], Nortriptyline, and Prednisone   Review of Systems Review of Systems  Constitutional: Negative for fatigue and fever.  HENT: Negative for ear pain, sinus pain, sore throat and voice change.   Eyes: Negative for pain, redness and visual disturbance.  Respiratory: Negative for cough and shortness of breath.   Cardiovascular: Negative for chest pain and palpitations.  Gastrointestinal: Positive for  abdominal pain. Negative for diarrhea and vomiting.  Musculoskeletal: Negative for arthralgias and myalgias.  Skin: Negative for rash and wound.  Neurological: Negative for syncope and headaches.     Physical Exam Triage Vital Signs ED Triage Vitals  Enc Vitals Group     BP 03/16/20 1457 (!) 180/93     Pulse Rate 03/16/20 1457 96     Resp 03/16/20 1457 (!) 22     Temp 03/16/20 1457 98.4 F (36.9 C)     Temp Source 03/16/20 1457 Oral     SpO2 03/16/20 1457 96 %     Weight --      Height --      Head Circumference --      Peak Flow --      Pain Score 03/16/20 1513 8     Pain Loc --      Pain Edu? --      Excl. in [place]? --    No data found.  Updated Vital Signs BP (!) 180/93 (BP Location: Right Arm)   Pulse 96   Temp 98.4 F (36.9 C) (Oral)   Resp (!) 22   SpO2 96%   Visual Acuity Right Eye Distance:   Left Eye Distance:   Bilateral Distance:    Right Eye Near:   Left Eye Near:    Bilateral Near:     Physical Exam Constitutional:      General: She is in acute distress.     Appearance: She is obese.     Comments: Second to pain  HENT:     Head: Normocephalic and atraumatic.  Eyes:     General: No scleral icterus.    Pupils: Pupils are equal, round, and reactive to light.  Cardiovascular:     Rate and Rhythm: Normal rate.  Pulmonary:     Effort: Pulmonary effort is normal. No respiratory  distress.     Breath sounds: No stridor. No wheezing.     Comments: Patient tachypneic, likely second to pain Abdominal:     General: Bowel sounds are normal.     Palpations: Abdomen is soft. There is no hepatomegaly, splenomegaly or pulsatile mass.     Tenderness: There is abdominal tenderness in the right upper quadrant. There is no rebound. Negative signs include Murphy's sign, Rovsing's sign and McBurney's sign.     Hernia: No hernia is present.  Skin:    Coloration: Skin is not jaundiced or pale.  Neurological:     Mental Status: She is alert and oriented to person, place, and time.      UC Treatments / Results  Labs (all labs ordered are listed, but only abnormal results are displayed) Labs Reviewed - No data to display  EKG   Radiology No results found.  Procedures Procedures (including critical care time)  Medications Ordered in UC Medications  alum & mag hydroxide-simeth (MAALOX/MYLANTA) 200-200-20 MG/5ML suspension 30 mL (30 mLs Oral Given 03/16/20 1544)    And  lidocaine (XYLOCAINE) 2 % viscous mouth solution 15 mL (15 mLs Oral Given 03/16/20 1544)  ondansetron (ZOFRAN-ODT) disintegrating tablet 4 mg (4 mg Oral Given 03/16/20 1545)    Initial Impression / Assessment and Plan / UC Course  I have reviewed the triage vital signs and the nursing notes.  Pertinent labs & imaging results that were available during my care of the patient were reviewed by me and considered in my medical decision making (see chart for details).     H&P limited due  to patient cooperation second pain.  Patient does seem to be in significant pain, though abdominal exam is generally reassuring and without Murphy sign.  Given history, trial of GI cocktail with lidocaine: Provided no relief.  Tried Zofran without relief.  Given significant pain of RUQ, recommended patient go to ER for further evaluation management.  Patient drove herself, unable to transport due to pain. EMS at bedside, will  bring patient to ER in stable condition. Final Clinical Impressions(s) / UC Diagnoses   Final diagnoses:  RUQ pain   Discharge Instructions   None    ED Prescriptions    None     PDMP not reviewed this encounter.   Hall-Potvin, [place], [place] 03/16/20 1631

## 2020-03-16 NOTE — ED Notes (Signed)
MD notified that patient is doubled over in chair in room hugging trash can crying of pain and nausea. meds ordered

## 2020-03-16 NOTE — ED Provider Notes (Signed)
[facility] DEPT Provider Note   CSN: [id number] Arrival date & time: 03/16/20  1627     History Chief Complaint  Patient presents with   Abdominal Pain    Norma [name] is a 59 y.o. female with history of acid reflux, obesity, diabetes, presenting to emergency department with abdominal pain.  She reports gradual onset of pain 3 days ago.  She describes a pressure and burning sensation in her epigastrium and the right side of her abdomen, worse with movement.  She has not been able to keep down foods.  She feels nauseated.  She reports she had only very small bowel movement yesterday but does not recall the last time she had a regular bowel movement.  She denies any history of abdominal surgeries or bowel obstructions.  She went to urgent care today and then was referred to emergency department for further imaging.  HPI     Past Medical History:  Diagnosis Date   A-fib ([place])    Acid reflux    Allergy    Anemia    Anxiety    Arthritis    Back pain    Depression    Diabetes mellitus without complication ([place])    on meds   Dysrhythmia    Headache    stress headaches, migraines at times   Heart murmur    Hypertension    Irregular heart beat    Left shoulder pain    Shortness of breath dyspnea    with exertion   Tachyarrhythmia    Trigger point of thoracic region 03/22/2012    Patient Active Problem List   Diagnosis Date Noted   Abdominal pain 03/16/2020   Cholelithiasis 03/16/2020   Hypertensive urgency 03/16/2020   Type 2 diabetes mellitus ([place]) 03/16/2020   Lead exposure 11/14/2019   TMJ (temporomandibular joint disorder) 07/31/2019   Greater trochanteric bursitis, left 02/27/2019   Uterine mass 12/19/2018   Allergic rhinitis with postnasal drip 11/17/2018   Diverticulosis of colon without hemorrhage 06/23/2018   Intractable migraine without aura and without status migrainosus 02/16/2018   Iron  deficiency anemia 03/29/2017   Vitamin D deficiency 03/01/2017   Lumbar herniated disc 06/06/2014   Depression    Prediabetes 01/14/2014   Spinal stenosis, lumbar region, with neurogenic claudication 11/21/2013   Hot flashes 06/30/2011   Eczema, dyshidrotic 06/02/2010   FIBROIDS, UTERUS 10/24/2009   GERD 06/09/2009   Chronic pain syndrome 10/04/2008   Hepatitis C carrier ([place]) 03/21/2007   Obesity 06/16/2006   HYPERTENSION, BENIGN SYSTEMIC 06/16/2006    Past Surgical History:  Procedure Laterality Date   ANKLE SURGERY     KNEE SURGERY Left    LUMBAR LAMINECTOMY/DECOMPRESSION MICRODISCECTOMY Left 06/06/2014   Procedure: LUMBAR LAMINECTOMY/DECOMPRESSION MICRODISCECTOMY 1 LEVEL;  Surgeon: Newman Pies, MD;  Location: [facility];  Service: Neurosurgery;  Laterality: Left;  Left L5S1 microdiskectomy   ROTATOR CUFF REPAIR Left 02/20/14   TUBAL LIGATION       OB History    Gravida  4   Para  4   Term  4   Preterm      AB  0   Living  4     SAB  0   TAB  0   Ectopic  0   Multiple  0   Live Births  4           Family History  Problem Relation Age of Onset   Diabetes Brother    Diabetes Maternal  Aunt    Diabetes Paternal Aunt    Diabetes Maternal Grandmother    Diabetes Maternal Grandfather    Diabetes Paternal Grandmother    Diabetes Paternal Grandfather    Congestive Heart Failure Mother    Hypertension Son    Colon cancer Neg Hx    Esophageal cancer Neg Hx    Rectal cancer Neg Hx    Stomach cancer Neg Hx     Social History   Tobacco Use   Smoking status: Never Smoker   Smokeless tobacco: Never Used  Vaping Use   Vaping Use: Never used  Substance Use Topics   Alcohol use: Yes    Comment: glass of wine occasionally   Drug use: No    Comment: States no longer uses marijuana    Home Medications Prior to Admission medications   Medication Sig Start Date End Date Taking? Authorizing Provider    benzonatate (TESSALON) 100 MG capsule Take 1 capsule (100 mg total) by mouth every 8 (eight) hours. 01/30/20  Yes Hall-Potvin, [place], PA-C  buPROPion (WELLBUTRIN XL) 300 MG 24 hr tablet Take 300 mg by mouth daily.   Yes [provider]  cetirizine (ZYRTEC ALLERGY) 10 MG tablet Take 1 tablet (10 mg total) by mouth daily. 01/30/20  Yes Hall-Potvin, [place], PA-C  cyclobenzaprine (FLEXERIL) 10 MG tablet TAKE 1 TABLET BY MOUTH TWICE A DAY AS NEEDED FOR MUSCLE SPASMS 12/23/17  Yes Meccariello, Bernita Raisin, DO  diclofenac Sodium (VOLTAREN) 1 % GEL Please apply 1-2 times daily to affected area 10/09/19  Yes Lockamy, Timothy, DO  fluticasone (FLONASE) 50 MCG/ACT nasal spray Place 2 sprays into both nostrils daily. 10/15/19  Yes Meccariello, Bernita Raisin, DO  fluticasone (FLONASE) 50 MCG/ACT nasal spray Place 1 spray into both nostrils daily. 01/30/20  Yes Hall-Potvin, [place], PA-C  gabapentin (NEURONTIN) 100 MG capsule Take 1 capsule (100 mg total) by mouth at bedtime. 05/25/19  Yes Meccariello, Bernita Raisin, DO  hydrochlorothiazide (HYDRODIURIL) 25 MG tablet Take 1 tablet (25 mg total) by mouth daily. 11/02/16  Yes Everrett Coombe, MD  losartan (COZAAR) 50 MG tablet Take 50 mg by mouth daily. 01/24/20  Yes [provider]  metFORMIN (GLUCOPHAGE) 500 MG tablet Take 500 mg by mouth 2 (two) times daily. 12/17/19  Yes [provider]  metoprolol tartrate (LOPRESSOR) 25 MG tablet Take 1 tablet (25 mg total) by mouth 2 (two) times daily. 05/13/14  Yes [name], MD  multivitamin-iron-minerals-folic acid (CENTRUM) chewable tablet Chew 1 tablet daily by mouth. 03/01/17  Yes [name], [name], MD  naproxen (NAPROSYN) 500 MG tablet Take 1 tablet (500 mg total) by mouth 2 (two) times daily with a meal. 10/09/19  Yes Lockamy, Timothy, DO  omeprazole (PRILOSEC) 20 MG capsule Take 2 capsules (40 mg total) by mouth daily. 05/25/19  Yes Meccariello, Bernita Raisin, DO  oxyCODONE-acetaminophen (PERCOCET) 10-325 MG  tablet Take 1 tablet by mouth 3 (three) times daily. 07/23/19  Yes [provider]  potassium chloride (KLOR-CON) 10 MEQ tablet Take 10 mEq by mouth 2 (two) times daily. 01/24/20  Yes [provider]  traZODone (DESYREL) 100 MG tablet TAKE 2 TABLETS BY MOUTH ONCE AT BEDTIME 05/31/19  Yes Meccariello, Bernita Raisin, DO  Vitamin D, Ergocalciferol, (DRISDOL) 50000 units CAPS capsule TAKE 1 CAPSULE BY MOUTH EVERY 7 (SEVEN) DAYS. 12/09/17  Yes Meccariello, Bernita Raisin, DO  diclofenac (VOLTAREN) 75 MG EC tablet Take 1 tablet (75 mg total) by mouth 2 (two) times daily. Patient not taking: Reported  on 03/16/2020 11/14/19   Daisy Floro, DO  ferrous sulfate 325 (65 FE) MG tablet Take 1 tablet (325 mg total) by mouth 2 (two) times daily with a meal. 02/22/19 04/14/19  Shirley, [place], DO  loratadine (CLARITIN) 10 MG tablet Take 1 tablet (10 mg total) by mouth daily. 04/11/19 08/12/19  Meccariello, Bernita Raisin, DO  traZODone (DESYREL) 100 MG tablet TAKE 2 TABLETS BY MOUTH ONCE AT BEDTIME 09/01/18   Daisy Floro, DO    Allergies    Soma [carisoprodol], Nortriptyline, and Prednisone  Review of Systems   Review of Systems  Constitutional: Negative for chills and fever.  HENT: Negative for ear pain and sore throat.   Eyes: Negative for pain and visual disturbance.  Respiratory: Negative for cough and shortness of breath.   Cardiovascular: Negative for chest pain and palpitations.  Gastrointestinal: Positive for abdominal pain, nausea and vomiting.  Genitourinary: Negative for dysuria and hematuria.  Musculoskeletal: Negative for arthralgias and back pain.  Skin: Negative for color change and rash.  Neurological: Negative for syncope and light-headedness.  All other systems reviewed and are negative.   Physical Exam Updated Vital Signs BP (!) 143/89 (BP Location: Right Arm)    Pulse 73    Temp 97.6 F (36.4 C) (Oral)    Resp 18    Ht 5\' 8"  (1.727 m)    Wt 114 kg    SpO2 98%    BMI 38.21  kg/m   Physical Exam Vitals and nursing note reviewed.  Constitutional:      General: She is in acute distress.     Appearance: She is obese.  HENT:     Head: Normocephalic and atraumatic.  Eyes:     Conjunctiva/sclera: Conjunctivae normal.  Cardiovascular:     Rate and Rhythm: Normal rate and regular rhythm.     Heart sounds: No murmur heard.   Pulmonary:     Effort: Pulmonary effort is normal. No respiratory distress.     Breath sounds: Normal breath sounds.  Abdominal:     Palpations: Abdomen is soft.     Tenderness: There is abdominal tenderness in the right upper quadrant and epigastric area. There is no guarding or rebound. Positive signs include Murphy's sign.  Musculoskeletal:     Cervical back: Neck supple.  Skin:    General: Skin is warm and dry.  Neurological:     General: No focal deficit present.     Mental Status: She is alert and oriented to person, place, and time.     ED Results / Procedures / Treatments   Labs (all labs ordered are listed, but only abnormal results are displayed) Labs Reviewed  COMPREHENSIVE METABOLIC PANEL - Abnormal; Notable for the following components:      Result Value   Glucose, Bld 111 (*)    All other components within normal limits  URINALYSIS, ROUTINE W REFLEX MICROSCOPIC - Abnormal; Notable for the following components:   pH 9.0 (*)    Protein, ur 30 (*)    Leukocytes,Ua TRACE (*)    All other components within normal limits  HEMOGLOBIN A1C - Abnormal; Notable for the following components:   Hgb A1c MFr Bld 6.1 (*)    All other components within normal limits  GLUCOSE, CAPILLARY - Abnormal; Notable for the following components:   Glucose-Capillary 102 (*)    All other components within normal limits  GLUCOSE, CAPILLARY - Abnormal; Notable for the following components:   Glucose-Capillary 135 (*)  All other components within normal limits  GLUCOSE, CAPILLARY - Abnormal; Notable for the following components:    Glucose-Capillary 121 (*)    All other components within normal limits  COMPREHENSIVE METABOLIC PANEL - Abnormal; Notable for the following components:   Glucose, Bld 134 (*)    Calcium 8.6 (*)    AST 350 (*)    ALT 146 (*)    Total Bilirubin 3.0 (*)    All other components within normal limits  GLUCOSE, CAPILLARY - Abnormal; Notable for the following components:   Glucose-Capillary 116 (*)    All other components within normal limits  RESP PANEL BY RT-PCR (FLU A&B, COVID) ARPGX2  URINE CULTURE  LIPASE, BLOOD  CBC  HIV ANTIBODY (ROUTINE TESTING W REFLEX)  CBC WITH DIFFERENTIAL/PLATELET  LIPASE, BLOOD  MAGNESIUM    EKG None  Radiology CT ABDOMEN PELVIS W CONTRAST  Result Date: 03/16/2020 CLINICAL DATA:  Right upper quadrant pain. EXAM: CT ABDOMEN AND PELVIS WITH CONTRAST TECHNIQUE: Multidetector CT imaging of the abdomen and pelvis was performed using the standard protocol following bolus administration of intravenous contrast. CONTRAST:  171mL OMNIPAQUE IOHEXOL 300 MG/ML  SOLN COMPARISON:  March 26, 2017 FINDINGS: Lower chest: Very mild atelectasis is seen within the bilateral lung bases. Hepatobiliary: No focal liver abnormality is seen. The gallbladder is moderately distended. No gallstones are identified. Mild pericholecystic inflammatory fat stranding is seen. Pancreas: Unremarkable. No pancreatic ductal dilatation or surrounding inflammatory changes. Spleen: Normal in size without focal abnormality. Adrenals/Urinary Tract: Adrenal glands are unremarkable. Kidneys are normal, without renal calculi, focal lesion, or hydronephrosis. Bladder is unremarkable. Stomach/Bowel: Stomach is within normal limits. The appendix is not clearly identified. No evidence of bowel wall thickening, distention, or inflammatory changes. Vascular/Lymphatic: No significant vascular findings are present. No enlarged abdominal or pelvic lymph nodes. Reproductive: The uterus is normal in size and appearance.  A stable 4.3 cm x 5.2 cm lobulated area of low attenuation is seen within the posterior aspect of the left adnexa. Other: No abdominal wall hernia or abnormality. No abdominopelvic ascites. Musculoskeletal: No acute or significant osseous findings. IMPRESSION: 1. Findings consistent with acalculous acute cholecystitis. Correlation with right upper quadrant ultrasound is recommended. 2. Stable left adnexal cyst, likely ovarian in origin. Further correlation with pelvic ultrasound is recommended. Electronically Signed   By: Virgina Norfolk M.D.   On: 03/16/2020 18:31   US Abdomen Limited RUQ (LIVER/GB)  Result Date: 03/16/2020 CLINICAL DATA:  Biliary colic symptoms, hypertension, diabetes EXAM: ULTRASOUND ABDOMEN LIMITED RIGHT UPPER QUADRANT COMPARISON:  CT abdomen pelvis 03/27/2017. FINDINGS: Gallbladder: Calcified stone and sludge noted within the gallbladder lumen. No pericholecystic fluid or wall thickening visualized. No sonographic Murphy sign noted by sonographer. Common bile duct: Diameter: 8 mm-upper limits of normal/borderline enlarged Liver: No focal lesion identified. Increased in parenchymal echogenicity. Portal vein is patent on color Doppler imaging with normal direction of blood flow towards the liver. Other: None. IMPRESSION: 1. Cholelithiasis and gallbladder sludge with no findings to suggest acute cholecystitis. 2. The common bile duct measures at the upper limits of normal/borderline enlarged (8 mm) with choledocholithiasis more distally not excluded. 3. Hepatic steatosis. Please note limited evaluation for focal hepatic masses in a patient with hepatic steatosis due to decreased penetration of the acoustic ultrasound waves. Electronically Signed   By: Iven Finn M.D.   On: 03/16/2020 19:46    Procedures Procedures (including critical care time)  Medications Ordered in ED Medications  ondansetron (ZOFRAN) injection 4 mg (has no administration  in time range)  acetaminophen  (TYLENOL) tablet 650 mg (650 mg Oral Given 03/17/20 1053)    Or  acetaminophen (TYLENOL) suppository 650 mg ( Rectal See Alternative 03/17/20 1053)  0.9 %  sodium chloride infusion ( Intravenous New Bag/Given 03/17/20 0151)  hydrALAZINE (APRESOLINE) injection 5 mg (has no administration in time range)  insulin aspart (novoLOG) injection 0-9 Units (0 Units Subcutaneous Not Given 03/17/20 0920)  morphine 2 MG/ML injection 2 mg (has no administration in time range)  fluticasone (FLONASE) 50 MCG/ACT nasal spray 1 spray (1 spray Each Nare Given 03/17/20 1053)  buPROPion (WELLBUTRIN XL) 24 hr tablet 300 mg (300 mg Oral Given 03/17/20 0917)  loratadine (CLARITIN) tablet 10 mg (10 mg Oral Given 03/17/20 0917)  cyclobenzaprine (FLEXERIL) tablet 10 mg (has no administration in time range)  gabapentin (NEURONTIN) capsule 100 mg (has no administration in time range)  losartan (COZAAR) tablet 50 mg (50 mg Oral Given 03/17/20 0916)  metoprolol tartrate (LOPRESSOR) tablet 25 mg (25 mg Oral Given 03/17/20 0917)  multivitamin-iron-minerals-folic acid (CENTRUM) chewable tablet 1 tablet (1 tablet Oral Given 03/17/20 1053)  pantoprazole (PROTONIX) EC tablet 80 mg (80 mg Oral Given 03/17/20 0918)  traZODone (DESYREL) tablet 100 mg (has no administration in time range)  Vitamin D (Ergocalciferol) (DRISDOL) capsule 50,000 Units (has no administration in time range)  hydrochlorothiazide (HYDRODIURIL) tablet 25 mg (25 mg Oral Given 03/17/20 0915)  potassium chloride SA (KLOR-CON) CR tablet 20 mEq (20 mEq Oral Given 03/17/20 0916)  morphine 4 MG/ML injection 4 mg (4 mg Intravenous Given 03/16/20 1748)  ondansetron (ZOFRAN) injection 4 mg (4 mg Intravenous Given 03/16/20 1748)  fentaNYL (SUBLIMAZE) injection 50 mcg (50 mcg Intravenous Given 03/16/20 1805)  iohexol (OMNIPAQUE) 300 MG/ML solution 100 mL (100 mLs Intravenous Contrast Given 03/16/20 1813)  HYDROmorphone (DILAUDID) injection 1 mg (1 mg Intravenous Given  03/16/20 2029)  piperacillin-tazobactam (ZOSYN) IVPB 3.375 g (3.375 g Intravenous New Bag/Given 03/16/20 2044)  labetalol (NORMODYNE) injection 10 mg (10 mg Intravenous Given 03/17/20 0423)    ED Course  I have reviewed the triage vital signs and the nursing notes.  Pertinent labs & imaging results that were available during my care of the patient were reviewed by me and considered in my medical decision making (see chart for details).  This patient presents to the Emergency Department with complaint of abdominal pain. This involves an extensive number of treatment options, and is a complaint that carries with it a high risk of complications and morbidity.  The differential diagnosis includes gastritis vs biliary disease vs constipation vs inflammatory bowel disease vs intraabdominal infection vs other  I ordered, reviewed, and interpreted labs.  CMP unremarkable.  WBC wnl.  UA without sign of infection.  Lipase normal. I ordered medication IV morphine for abdominal pain and/or nausea I ordered imaging studies which included CT abdomen and RUQ ultrasound I independently visualized and interpreted imaging which showed CBD dilation to 8 mm and the monitor tracing which showed sinus rhythm  This workup was most consistent with biliary colic vs choledocholithiasis.  Lower suspicion for cholangitis.  IV zosyn ordered as single dose for empiric coverage for intraabdominal infection given the mild inflammatory changes noted on CT scan around GB.   Clinical Course as of Mar 17 1214  Nancy Fetter Mar 16, 2020  2036 Will admit for HIDA scan - GI team added in consultation.  Her pain has improved but she still has some discomfort.  Mya be choledocolithiasis.  With no fever, leukocytosis,  or clear stigmata for acute cholecystitis, I don't think she needs an emergent surgical consultation in the ED   [MT]  2103 Signed out to hospitalist   [MT]    Clinical Course User Index [MT] Niya Behler, Carola Rhine, MD     Final Clinical Impression(s) / ED Diagnoses Final diagnoses:  Biliary colic symptom    Rx / DC Orders ED Discharge Orders    None       Wyvonnia Dusky, MD 03/17/20 1215

## 2020-03-16 NOTE — ED Triage Notes (Signed)
Pt here for upper abd pain on right side x 3 days

## 2020-03-17 ENCOUNTER — Inpatient Hospital Stay (HOSPITAL_COMMUNITY): Payer: Medicare Other

## 2020-03-17 ENCOUNTER — Observation Stay (HOSPITAL_COMMUNITY): Payer: Medicare Other

## 2020-03-17 DIAGNOSIS — F32A Depression, unspecified: Secondary | ICD-10-CM | POA: Diagnosis not present

## 2020-03-17 DIAGNOSIS — K8050 Calculus of bile duct without cholangitis or cholecystitis without obstruction: Secondary | ICD-10-CM | POA: Diagnosis not present

## 2020-03-17 DIAGNOSIS — R748 Abnormal levels of other serum enzymes: Secondary | ICD-10-CM

## 2020-03-17 DIAGNOSIS — K8021 Calculus of gallbladder without cholecystitis with obstruction: Secondary | ICD-10-CM

## 2020-03-17 DIAGNOSIS — Z79891 Long term (current) use of opiate analgesic: Secondary | ICD-10-CM | POA: Diagnosis not present

## 2020-03-17 DIAGNOSIS — I4891 Unspecified atrial fibrillation: Secondary | ICD-10-CM | POA: Diagnosis present

## 2020-03-17 DIAGNOSIS — R1011 Right upper quadrant pain: Secondary | ICD-10-CM | POA: Diagnosis not present

## 2020-03-17 DIAGNOSIS — Z8249 Family history of ischemic heart disease and other diseases of the circulatory system: Secondary | ICD-10-CM | POA: Diagnosis not present

## 2020-03-17 DIAGNOSIS — K8020 Calculus of gallbladder without cholecystitis without obstruction: Secondary | ICD-10-CM | POA: Diagnosis not present

## 2020-03-17 DIAGNOSIS — R7989 Other specified abnormal findings of blood chemistry: Secondary | ICD-10-CM | POA: Diagnosis not present

## 2020-03-17 DIAGNOSIS — G8929 Other chronic pain: Secondary | ICD-10-CM | POA: Diagnosis present

## 2020-03-17 DIAGNOSIS — Z6838 Body mass index (BMI) 38.0-38.9, adult: Secondary | ICD-10-CM | POA: Diagnosis not present

## 2020-03-17 DIAGNOSIS — R768 Other specified abnormal immunological findings in serum: Secondary | ICD-10-CM | POA: Diagnosis not present

## 2020-03-17 DIAGNOSIS — Z833 Family history of diabetes mellitus: Secondary | ICD-10-CM | POA: Diagnosis not present

## 2020-03-17 DIAGNOSIS — E559 Vitamin D deficiency, unspecified: Secondary | ICD-10-CM | POA: Diagnosis not present

## 2020-03-17 DIAGNOSIS — K838 Other specified diseases of biliary tract: Secondary | ICD-10-CM | POA: Diagnosis not present

## 2020-03-17 DIAGNOSIS — Z0389 Encounter for observation for other suspected diseases and conditions ruled out: Secondary | ICD-10-CM | POA: Diagnosis not present

## 2020-03-17 DIAGNOSIS — K915 Postcholecystectomy syndrome: Secondary | ICD-10-CM | POA: Diagnosis not present

## 2020-03-17 DIAGNOSIS — K760 Fatty (change of) liver, not elsewhere classified: Secondary | ICD-10-CM | POA: Diagnosis not present

## 2020-03-17 DIAGNOSIS — K7460 Unspecified cirrhosis of liver: Secondary | ICD-10-CM | POA: Diagnosis not present

## 2020-03-17 DIAGNOSIS — K7400 Hepatic fibrosis, unspecified: Secondary | ICD-10-CM | POA: Diagnosis not present

## 2020-03-17 DIAGNOSIS — I10 Essential (primary) hypertension: Secondary | ICD-10-CM | POA: Diagnosis not present

## 2020-03-17 DIAGNOSIS — I160 Hypertensive urgency: Secondary | ICD-10-CM

## 2020-03-17 DIAGNOSIS — B1920 Unspecified viral hepatitis C without hepatic coma: Secondary | ICD-10-CM | POA: Diagnosis present

## 2020-03-17 DIAGNOSIS — Z79899 Other long term (current) drug therapy: Secondary | ICD-10-CM | POA: Diagnosis not present

## 2020-03-17 DIAGNOSIS — K8010 Calculus of gallbladder with chronic cholecystitis without obstruction: Secondary | ICD-10-CM | POA: Diagnosis not present

## 2020-03-17 DIAGNOSIS — K828 Other specified diseases of gallbladder: Secondary | ICD-10-CM | POA: Diagnosis not present

## 2020-03-17 DIAGNOSIS — K8001 Calculus of gallbladder with acute cholecystitis with obstruction: Secondary | ICD-10-CM | POA: Diagnosis not present

## 2020-03-17 DIAGNOSIS — E119 Type 2 diabetes mellitus without complications: Secondary | ICD-10-CM

## 2020-03-17 DIAGNOSIS — Z791 Long term (current) use of non-steroidal anti-inflammatories (NSAID): Secondary | ICD-10-CM | POA: Diagnosis not present

## 2020-03-17 DIAGNOSIS — R932 Abnormal findings on diagnostic imaging of liver and biliary tract: Secondary | ICD-10-CM

## 2020-03-17 DIAGNOSIS — E669 Obesity, unspecified: Secondary | ICD-10-CM | POA: Diagnosis present

## 2020-03-17 DIAGNOSIS — Z888 Allergy status to other drugs, medicaments and biological substances status: Secondary | ICD-10-CM | POA: Diagnosis not present

## 2020-03-17 DIAGNOSIS — F419 Anxiety disorder, unspecified: Secondary | ICD-10-CM | POA: Diagnosis present

## 2020-03-17 DIAGNOSIS — R945 Abnormal results of liver function studies: Secondary | ICD-10-CM | POA: Diagnosis not present

## 2020-03-17 DIAGNOSIS — J90 Pleural effusion, not elsewhere classified: Secondary | ICD-10-CM | POA: Diagnosis not present

## 2020-03-17 DIAGNOSIS — K219 Gastro-esophageal reflux disease without esophagitis: Secondary | ICD-10-CM | POA: Diagnosis present

## 2020-03-17 DIAGNOSIS — Z20822 Contact with and (suspected) exposure to covid-19: Secondary | ICD-10-CM | POA: Diagnosis present

## 2020-03-17 LAB — COMPREHENSIVE METABOLIC PANEL WITH GFR
ALT: 146 U/L — ABNORMAL HIGH (ref 0–44)
AST: 350 U/L — ABNORMAL HIGH (ref 15–41)
Albumin: 3.5 g/dL (ref 3.5–5.0)
Alkaline Phosphatase: 109 U/L (ref 38–126)
Anion gap: 11 (ref 5–15)
BUN: 12 mg/dL (ref 8–23)
CO2: 25 mmol/L (ref 22–32)
Calcium: 8.6 mg/dL — ABNORMAL LOW (ref 8.9–10.3)
Chloride: 103 mmol/L (ref 98–111)
Creatinine, Ser: 0.96 mg/dL (ref 0.44–1.00)
GFR, Estimated: >60 mL/min
Glucose, Bld: 134 mg/dL — ABNORMAL HIGH (ref 70–99)
Potassium: 4 mmol/L (ref 3.5–5.1)
Sodium: 139 mmol/L (ref 135–145)
Total Bilirubin: 3 mg/dL — ABNORMAL HIGH (ref 0.3–1.2)
Total Protein: 7.5 g/dL (ref 6.5–8.1)

## 2020-03-17 LAB — CBC WITH DIFFERENTIAL/PLATELET
Abs Immature Granulocytes: 0.04 10*3/uL (ref 0.00–0.07)
Basophils Absolute: 0 10*3/uL (ref 0.0–0.1)
Basophils Relative: 0 %
Eosinophils Absolute: 0 10*3/uL (ref 0.0–0.5)
Eosinophils Relative: 0 %
HCT: 39.6 % (ref 36.0–46.0)
Hemoglobin: 12.4 g/dL (ref 12.0–15.0)
Immature Granulocytes: 0 %
Lymphocytes Relative: 19 %
Lymphs Abs: 1.7 10*3/uL (ref 0.7–4.0)
MCH: 27.9 pg (ref 26.0–34.0)
MCHC: 31.3 g/dL (ref 30.0–36.0)
MCV: 89 fL (ref 80.0–100.0)
Monocytes Absolute: 0.6 10*3/uL (ref 0.1–1.0)
Monocytes Relative: 6 %
Neutro Abs: 6.8 10*3/uL (ref 1.7–7.7)
Neutrophils Relative %: 75 %
Platelets: 197 10*3/uL (ref 150–400)
RBC: 4.45 MIL/uL (ref 3.87–5.11)
RDW: 14 % (ref 11.5–15.5)
WBC: 9.1 10*3/uL (ref 4.0–10.5)
nRBC: 0 % (ref 0.0–0.2)

## 2020-03-17 LAB — GLUCOSE, CAPILLARY
Glucose-Capillary: 102 mg/dL — ABNORMAL HIGH (ref 70–99)
Glucose-Capillary: 116 mg/dL — ABNORMAL HIGH (ref 70–99)
Glucose-Capillary: 118 mg/dL — ABNORMAL HIGH (ref 70–99)
Glucose-Capillary: 121 mg/dL — ABNORMAL HIGH (ref 70–99)
Glucose-Capillary: 135 mg/dL — ABNORMAL HIGH (ref 70–99)
Glucose-Capillary: 92 mg/dL (ref 70–99)

## 2020-03-17 LAB — LIPASE, BLOOD: Lipase: 32 U/L (ref 11–51)

## 2020-03-17 LAB — MAGNESIUM: Magnesium: 1.9 mg/dL (ref 1.7–2.4)

## 2020-03-17 LAB — HIV ANTIBODY (ROUTINE TESTING W REFLEX): HIV Screen 4th Generation wRfx: NONREACTIVE

## 2020-03-17 LAB — HEMOGLOBIN A1C
Hgb A1c MFr Bld: 6.1 % — ABNORMAL HIGH (ref 4.8–5.6)
Mean Plasma Glucose: 128.37 mg/dL

## 2020-03-17 MED ORDER — PIPERACILLIN-TAZOBACTAM 3.375 G IVPB
3.3750 g | Freq: Three times a day (TID) | INTRAVENOUS | Status: DC
  Administered 2020-03-17 – 2020-03-20 (×9): 3.375 g via INTRAVENOUS
  Filled 2020-03-17 (×9): qty 50

## 2020-03-17 MED ORDER — LABETALOL HCL 5 MG/ML IV SOLN
10.0000 mg | Freq: Once | INTRAVENOUS | Status: AC
  Administered 2020-03-17: 10 mg via INTRAVENOUS
  Filled 2020-03-17: qty 4

## 2020-03-17 MED ORDER — GABAPENTIN 100 MG PO CAPS
100.0000 mg | ORAL_CAPSULE | Freq: Every day | ORAL | Status: DC
  Administered 2020-03-17 – 2020-03-19 (×3): 100 mg via ORAL
  Filled 2020-03-17 (×3): qty 1

## 2020-03-17 MED ORDER — LORAZEPAM 2 MG/ML IJ SOLN
1.0000 mg | Freq: Once | INTRAMUSCULAR | Status: AC
  Administered 2020-03-17: 1 mg via INTRAVENOUS
  Filled 2020-03-17: qty 1

## 2020-03-17 MED ORDER — TRAZODONE HCL 100 MG PO TABS
100.0000 mg | ORAL_TABLET | Freq: Every day | ORAL | Status: DC
  Administered 2020-03-17 – 2020-03-19 (×3): 100 mg via ORAL
  Filled 2020-03-17 (×4): qty 1

## 2020-03-17 MED ORDER — FLUTICASONE PROPIONATE 50 MCG/ACT NA SUSP
1.0000 | Freq: Every day | NASAL | Status: DC
  Administered 2020-03-17 – 2020-03-18 (×2): 1 via NASAL
  Filled 2020-03-17: qty 16

## 2020-03-17 MED ORDER — MORPHINE SULFATE (PF) 4 MG/ML IV SOLN
3.0000 mg | INTRAVENOUS | Status: DC | PRN
  Administered 2020-03-18 – 2020-03-20 (×3): 3 mg via INTRAVENOUS
  Filled 2020-03-17 (×3): qty 1

## 2020-03-17 MED ORDER — POTASSIUM CHLORIDE CRYS ER 20 MEQ PO TBCR
20.0000 meq | EXTENDED_RELEASE_TABLET | Freq: Every day | ORAL | Status: DC
  Administered 2020-03-17 – 2020-03-19 (×3): 20 meq via ORAL
  Filled 2020-03-17 (×3): qty 1

## 2020-03-17 MED ORDER — KETOROLAC TROMETHAMINE 30 MG/ML IJ SOLN
30.0000 mg | Freq: Once | INTRAMUSCULAR | Status: DC
  Filled 2020-03-17 (×2): qty 1

## 2020-03-17 MED ORDER — BUPROPION HCL ER (XL) 300 MG PO TB24
300.0000 mg | ORAL_TABLET | Freq: Every day | ORAL | Status: DC
  Administered 2020-03-17 – 2020-03-20 (×4): 300 mg via ORAL
  Filled 2020-03-17 (×4): qty 1

## 2020-03-17 MED ORDER — TECHNETIUM TC 99M MEBROFENIN IV KIT
5.5000 | PACK | Freq: Once | INTRAVENOUS | Status: AC | PRN
  Administered 2020-03-17: 5.5 via INTRAVENOUS

## 2020-03-17 MED ORDER — LORATADINE 10 MG PO TABS
10.0000 mg | ORAL_TABLET | Freq: Every day | ORAL | Status: DC
  Administered 2020-03-17 – 2020-03-19 (×3): 10 mg via ORAL
  Filled 2020-03-17 (×3): qty 1

## 2020-03-17 MED ORDER — LOSARTAN POTASSIUM 50 MG PO TABS
50.0000 mg | ORAL_TABLET | Freq: Every day | ORAL | Status: DC
  Administered 2020-03-17 – 2020-03-19 (×3): 50 mg via ORAL
  Filled 2020-03-17 (×3): qty 1

## 2020-03-17 MED ORDER — METOPROLOL TARTRATE 25 MG PO TABS
25.0000 mg | ORAL_TABLET | Freq: Two times a day (BID) | ORAL | Status: DC
  Administered 2020-03-17 – 2020-03-20 (×7): 25 mg via ORAL
  Filled 2020-03-17 (×7): qty 1

## 2020-03-17 MED ORDER — PIPERACILLIN-TAZOBACTAM 3.375 G IVPB 30 MIN: 3.3750 g | Freq: Four times a day (QID) | INTRAVENOUS | Status: DC

## 2020-03-17 MED ORDER — PANTOPRAZOLE SODIUM 40 MG PO TBEC
80.0000 mg | DELAYED_RELEASE_TABLET | Freq: Every day | ORAL | Status: DC
  Administered 2020-03-17 – 2020-03-20 (×4): 80 mg via ORAL
  Filled 2020-03-17 (×6): qty 2

## 2020-03-17 MED ORDER — INSULIN ASPART 100 UNIT/ML ~~LOC~~ SOLN: 0.0000 [IU] | Freq: Four times a day (QID) | SUBCUTANEOUS | Status: DC

## 2020-03-17 MED ORDER — HYDROCHLOROTHIAZIDE 25 MG PO TABS
25.0000 mg | ORAL_TABLET | Freq: Every day | ORAL | Status: DC
  Administered 2020-03-17 – 2020-03-19 (×3): 25 mg via ORAL
  Filled 2020-03-17 (×3): qty 1

## 2020-03-17 MED ORDER — CYCLOBENZAPRINE HCL 10 MG PO TABS: 10.0000 mg | ORAL_TABLET | Freq: Two times a day (BID) | ORAL | Status: DC | PRN

## 2020-03-17 MED ORDER — CENTRUM PO CHEW
1.0000 | CHEWABLE_TABLET | Freq: Every day | ORAL | Status: DC
  Administered 2020-03-17 – 2020-03-19 (×3): 1 via ORAL
  Filled 2020-03-17 (×4): qty 1

## 2020-03-17 MED ORDER — VITAMIN D (ERGOCALCIFEROL) 1.25 MG (50000 UNIT) PO CAPS
50000.0000 [IU] | ORAL_CAPSULE | ORAL | Status: DC
  Administered 2020-03-18: 50000 [IU] via ORAL
  Filled 2020-03-17: qty 1

## 2020-03-17 MED ORDER — GADOBUTROL 1 MMOL/ML IV SOLN: 10.0000 mL | Freq: Once | INTRAVENOUS | Status: DC | PRN

## 2020-03-17 MED ORDER — MORPHINE SULFATE (PF) 2 MG/ML IV SOLN
2.0000 mg | INTRAVENOUS | Status: DC | PRN
  Administered 2020-03-17 (×2): 2 mg via INTRAVENOUS
  Filled 2020-03-17 (×2): qty 1

## 2020-03-17 MED ORDER — SODIUM CHLORIDE 0.9 % IV SOLN: INTRAVENOUS | Status: AC

## 2020-03-17 NOTE — Consult Note (Signed)
[facility] Surgery Consult Note  SIDNEE GAMBRILL [birth year]  [id number].    Requesting MD: Tye Savoy PA-C Chief Complaint:RUQ pain x 3 days  Reason for Consult: possible cholecystitis  HPI:  Patient is a 59 year old female who presented to the [facility] center with a 3-day history of right upper quadrant pain.  The pain started after eating on 03/14/2020.  The pain has become progressively worse.  She denied any nausea, vomiting, chest pain, or difficulty breathing.  She tried several over-the-counter medications for pain and GERD without relief. Pain located in epigastrium and RUQ and does not radiate at all. Pain characterized as sharp, constant. PMH otherwise significant for HTN, T2DM, Atrial fibrillation. She is not on any blood thinning medications. Past abdominal surgery includes a tubal ligation. She reports alcohol use on special occasions and holidays. Denies tobacco or drug use. Patient is retired. Her son is at the bedside.   Work-up at the facility included a CMP which was normal except for a glucose at 111.  Lipase was 34, WBC 7.8, H/H 13.4/42.0, platelets 211,000. CT of the abdomen was obtained 03/16/2020.  This showed mild atelectasis both lung bases.  No focal liver abnormality.  The gallbladder was moderately distended.  No gallstones identified, mild pericholecystic inflammatory stranding was seen.  Pancreas was unremarkable with no pancreatic ductal dilatation or surrounding inflammatory changes.  Findings were thought to be consistent with a calculus acute cholecystitis.  Abdominal ultrasound was then obtained.  This showed calcified stone and sludge noted within the gallbladder lumen.  No pericholecystic fluid or wall thickening was seen.  No sonographic Murphy sign.  The common bile duct was 8 mm borderline normal/enlarged.  No focal liver lesions Doppler imaging with normal direction of blood flow towards the liver.  This conclusion was cholelithiasis and  sludge with no findings to suggest acute cholecystitis.  The common bile duct was borderline normal.  A HIDA scan was then obtained today.  This showed diffuse tracer uptake by the liver with absent biliary excretion after 190 minutes consistent with either common bile obstruction or hepatocellular dysfunction secondary to hepatitis. Repeat labs today again shows the CMP essentially normal except for glucose of 134, calcium 8.6, AST 350, ALT 146, total bilirubin is 3.0 today.  WBC 9.1, hemoglobin 12.4, hematocrit 39.6, platelets 197,000. She has been seen by [facility].  An MRCP has been ordered to help solidify the diagnosis.  We are asked to see.   ROS: Review of Systems  Constitutional: Negative for chills and fever.  Respiratory: Negative for shortness of breath and wheezing.   Cardiovascular: Negative for chest pain and palpitations.  Gastrointestinal: Positive for abdominal pain and constipation. Negative for blood in stool, diarrhea, melena, nausea and vomiting.  Genitourinary: Negative for dysuria, frequency and urgency.  All other systems reviewed and are negative.   Family History  Problem Relation Age of Onset  . Diabetes Brother   . Diabetes Maternal Aunt   . Diabetes Paternal Aunt   . Diabetes Maternal Grandmother   . Diabetes Maternal Grandfather   . Diabetes Paternal Grandmother   . Diabetes Paternal Grandfather   . Congestive Heart Failure Mother   . Hypertension Son   . Colon cancer Neg Hx   . Esophageal cancer Neg Hx   . Rectal cancer Neg Hx   . Stomach cancer Neg Hx     Past Medical History:  Diagnosis Date  . A-fib ([place])   . Acid reflux   . Allergy   .  Anemia   . Anxiety   . Arthritis   . Back pain   . Depression   . Diabetes mellitus without complication ([place])    on meds  . Dysrhythmia   . Headache    stress headaches, migraines at times  . Heart murmur   . Hypertension   . Irregular heart beat   . Left shoulder pain   . Shortness of breath  dyspnea    with exertion  . Tachyarrhythmia   . Trigger point of thoracic region 03/22/2012    Past Surgical History:  Procedure Laterality Date  . ANKLE SURGERY    . KNEE SURGERY Left   . LUMBAR LAMINECTOMY/DECOMPRESSION MICRODISCECTOMY Left 06/06/2014   Procedure: LUMBAR LAMINECTOMY/DECOMPRESSION MICRODISCECTOMY 1 LEVEL;  Surgeon: Newman Pies, MD;  Location: [place] [facility];  Service: Neurosurgery;  Laterality: Left;  Left L5S1 microdiskectomy  . ROTATOR CUFF REPAIR Left 02/20/14  . TUBAL LIGATION      Social History:  reports that she has never smoked. She has never used smokeless tobacco. She reports current alcohol use. She reports that she does not use drugs.  Allergies:  Allergies  Allergen Reactions  . Soma [Carisoprodol] Itching    Itching/rash around mouth, itching back of throat.   . Nortriptyline Rash  . Prednisone Rash    REACTION: rash and itching.     Medications Prior to Admission  Medication Sig Dispense Refill  . benzonatate (TESSALON) 100 MG capsule Take 1 capsule (100 mg total) by mouth every 8 (eight) hours. 21 capsule 0  . buPROPion (WELLBUTRIN XL) 300 MG 24 hr tablet Take 300 mg by mouth daily.    . cetirizine (ZYRTEC ALLERGY) 10 MG tablet Take 1 tablet (10 mg total) by mouth daily. 30 tablet 0  . cyclobenzaprine (FLEXERIL) 10 MG tablet TAKE 1 TABLET BY MOUTH TWICE A DAY AS NEEDED FOR MUSCLE SPASMS 60 tablet 3  . diclofenac Sodium (VOLTAREN) 1 % GEL Please apply 1-2 times daily to affected area 100 g 1  . fluticasone (FLONASE) 50 MCG/ACT nasal spray Place 2 sprays into both nostrils daily. 16 g 6  . fluticasone (FLONASE) 50 MCG/ACT nasal spray Place 1 spray into both nostrils daily. 16 g 0  . gabapentin (NEURONTIN) 100 MG capsule Take 1 capsule (100 mg total) by mouth at bedtime. 90 capsule 0  . hydrochlorothiazide (HYDRODIURIL) 25 MG tablet Take 1 tablet (25 mg total) by mouth daily. 90 tablet 3  . losartan (COZAAR) 50 MG tablet Take 50 mg by mouth daily.     . metFORMIN (GLUCOPHAGE) 500 MG tablet Take 500 mg by mouth 2 (two) times daily.    . metoprolol tartrate (LOPRESSOR) 25 MG tablet Take 1 tablet (25 mg total) by mouth 2 (two) times daily. 180 tablet 3  . multivitamin-iron-minerals-folic acid (CENTRUM) chewable tablet Chew 1 tablet daily by mouth. 90 tablet 3  . naproxen (NAPROSYN) 500 MG tablet Take 1 tablet (500 mg total) by mouth 2 (two) times daily with a meal. 30 tablet 0  . omeprazole (PRILOSEC) 20 MG capsule Take 2 capsules (40 mg total) by mouth daily. 90 capsule 2  . oxyCODONE-acetaminophen (PERCOCET) 10-325 MG tablet Take 1 tablet by mouth 3 (three) times daily.    . potassium chloride (KLOR-CON) 10 MEQ tablet Take 10 mEq by mouth 2 (two) times daily.    . traZODone (DESYREL) 100 MG tablet TAKE 2 TABLETS BY MOUTH ONCE AT BEDTIME 180 tablet 3  . Vitamin D, Ergocalciferol, (DRISDOL) 50000 units  CAPS capsule TAKE 1 CAPSULE BY MOUTH EVERY 7 (SEVEN) DAYS. 8 capsule 3  . diclofenac (VOLTAREN) 75 MG EC tablet Take 1 tablet (75 mg total) by mouth 2 (two) times daily. (Patient not taking: Reported on 03/16/2020) 30 tablet 0    Blood pressure (!) 143/89, pulse 73, temperature 97.6 F (36.4 C), temperature source Oral, resp. rate 18, height 5\' 8"  (1.727 m), weight 114 kg, SpO2 98 %. Physical Exam:  General: pleasant, WD, obese female who is laying in bed in NAD HEENT: head is normocephalic, atraumatic.  Sclera are anicteric.  PERRL.  Ears and nose without any masses or lesions.  Mouth is pink and moist Heart: regular, rate, and rhythm.  Normal s1,s2. No obvious murmurs, gallops, or rubs noted.  Palpable radial and pedal pulses bilaterally Lungs: CTAB, no wheezes, rhonchi, or rales noted.  Respiratory effort nonlabored Abd: soft, ttp in epigastrium and RUQ, ND, +BS, no masses, hernias, or organomegaly MS: all 4 extremities are symmetrical with no cyanosis, clubbing, or edema. Skin: warm and dry with no masses, lesions, or rashes Neuro: Cranial  nerves 2-12 grossly intact, sensation is normal throughout Psych: A&Ox3 with an appropriate affect.   Results for orders placed or performed during the hospital encounter of 03/16/20 (from the past 48 hour(s))  Lipase, blood     Status: None   Collection Time: 03/16/20  4:48 PM  Result Value Ref Range   Lipase 34 11 - 51 U/L    Comment: Performed at [facility], [place] [address], [place], [place] [address]  Comprehensive metabolic panel     Status: Abnormal   Collection Time: 03/16/20  4:48 PM  Result Value Ref Range   Sodium 138 135 - 145 mmol/L   Potassium 3.6 3.5 - 5.1 mmol/L   Chloride 104 98 - 111 mmol/L   CO2 25 22 - 32 mmol/L   Glucose, Bld 111 (H) 70 - 99 mg/dL    Comment: Glucose reference range applies only to samples taken after fasting for at least 8 hours.   BUN 12 8 - 23 mg/dL   Creatinine, Ser 0.60 0.44 - 1.00 mg/dL   Calcium 9.4 8.9 - 10.3 mg/dL   Total Protein 8.0 6.5 - 8.1 g/dL   Albumin 4.2 3.5 - 5.0 g/dL   AST 31 15 - 41 U/L   ALT 22 0 - 44 U/L   Alkaline Phosphatase 63 38 - 126 U/L   Total Bilirubin 0.8 0.3 - 1.2 mg/dL   GFR, Estimated >60 >60 mL/min    Comment: (NOTE) Calculated using the CKD-EPI Creatinine Equation (2021)    Anion gap 9 5 - 15    Comment: Performed at [facility], [place] [address], [place], [place] [address]  CBC     Status: None   Collection Time: 03/16/20  4:48 PM  Result Value Ref Range   WBC 7.8 4.0 - 10.5 K/uL   RBC 4.81 3.87 - 5.11 MIL/uL   Hemoglobin 13.4 12.0 - 15.0 g/dL   HCT 42.0 36 - 46 %   MCV 87.3 80.0 - 100.0 fL   MCH 27.9 26.0 - 34.0 pg   MCHC 31.9 30.0 - 36.0 g/dL   RDW 13.8 11.5 - 15.5 %   Platelets 211 150 - 400 K/uL   nRBC 0.0 0.0 - 0.2 %    Comment: Performed at [facility], [place] [address], [place], [place] [address]  Urinalysis, Routine w reflex microscopic  Status: Abnormal   Collection Time: 03/16/20  4:48 PM  Result Value Ref Range   Color,  Urine YELLOW YELLOW   APPearance CLEAR CLEAR   Specific Gravity, Urine 1.018 1.005 - 1.030   pH 9.0 (H) 5.0 - 8.0   Glucose, UA NEGATIVE NEGATIVE mg/dL   Hgb urine dipstick NEGATIVE NEGATIVE   Bilirubin Urine NEGATIVE NEGATIVE   Ketones, ur NEGATIVE NEGATIVE mg/dL   Protein, ur 30 (A) NEGATIVE mg/dL   Nitrite NEGATIVE NEGATIVE   Leukocytes,Ua TRACE (A) NEGATIVE   RBC / HPF 0-5 0 - 5 RBC/hpf   WBC, UA 6-10 0 - 5 WBC/hpf   Bacteria, UA NONE SEEN NONE SEEN   Squamous Epithelial / LPF 0-5 0 - 5    Comment: Performed at [facility], [place] [address] [place], [place] [address]  Hemoglobin A1c     Status: Abnormal   Collection Time: 03/16/20  4:48 PM  Result Value Ref Range   Hgb A1c MFr Bld 6.1 (H) 4.8 - 5.6 %    Comment: (NOTE) Pre diabetes:          5.7%-6.4%  Diabetes:              >6.4%  Glycemic control for   <7.0% adults with diabetes    Mean Plasma Glucose 128.37 mg/dL    Comment: Performed at [place] [address] [place], [place] [address]  Resp Panel by RT-PCR (Flu A&B, Covid) Nasopharyngeal Swab     Status: None   Collection Time: 03/16/20  8:21 PM   Specimen: Nasopharyngeal Swab; Nasopharyngeal(NP) swabs in vial transport medium  Result Value Ref Range   SARS Coronavirus 2 by RT PCR NEGATIVE NEGATIVE    Comment: (NOTE) SARS-CoV-2 target nucleic acids are NOT DETECTED.  The SARS-CoV-2 RNA is generally detectable in upper respiratory specimens during the acute phase of infection. The lowest concentration of SARS-CoV-2 viral copies this assay can detect is 138 copies/mL. A negative result does not preclude SARS-Cov-2 infection and should not be used as the sole basis for treatment or other patient management decisions. A negative result may occur with  improper specimen collection/handling, submission of specimen other than nasopharyngeal swab, presence of viral mutation(s) within the areas targeted by this assay, and inadequate number  of viral copies(<138 copies/mL). A negative result must be combined with clinical observations, patient history, and epidemiological information. The expected result is Negative.  Fact Sheet for Patients:  EntrepreneurPulse.com.au  Fact Sheet for Healthcare Providers:  IncredibleEmployment.be  This test is no t yet approved or cleared by the [place] FDA and  has been authorized for detection and/or diagnosis of SARS-CoV-2 by FDA under an Emergency Use Authorization (EUA). This EUA will remain  in effect (meaning this test can be used) for the duration of the COVID-19 declaration under Section 564(b)(1) of the Act, 21 U.S.C.section 360bbb-3(b)(1), unless the authorization is terminated  or revoked sooner.       Influenza A by PCR NEGATIVE NEGATIVE   Influenza B by PCR NEGATIVE NEGATIVE    Comment: (NOTE) The Xpert Xpress SARS-CoV-2/FLU/RSV plus assay is intended as an aid in the diagnosis of influenza from Nasopharyngeal swab specimens and should not be used as a sole basis for treatment. Nasal washings and aspirates are unacceptable for Xpert Xpress SARS-CoV-2/FLU/RSV testing.  Fact Sheet for Patients: EntrepreneurPulse.com.au  Fact Sheet for Healthcare Providers: IncredibleEmployment.be  This test is not yet approved or cleared by the [place] and  has been authorized for detection and/or diagnosis of SARS-CoV-2 by FDA under an Emergency Use Authorization (EUA). This EUA will remain in effect (meaning this test can be used) for the duration of the COVID-19 declaration under Section 564(b)(1) of the Act, 21 U.S.C. section 360bbb-3(b)(1), unless the authorization is terminated or revoked.  Performed at [facility], [place] [address], [place], [place] [address]   Glucose, capillary     Status: Abnormal   Collection Time: 03/17/20 12:01 AM  Result Value Ref Range    Glucose-Capillary 102 (H) 70 - 99 mg/dL    Comment: Glucose reference range applies only to samples taken after fasting for at least 8 hours.  Glucose, capillary     Status: Abnormal   Collection Time: 03/17/20  3:58 AM  Result Value Ref Range   Glucose-Capillary 135 (H) 70 - 99 mg/dL    Comment: Glucose reference range applies only to samples taken after fasting for at least 8 hours.  Glucose, capillary     Status: Abnormal   Collection Time: 03/17/20  7:45 AM  Result Value Ref Range   Glucose-Capillary 121 (H) 70 - 99 mg/dL    Comment: Glucose reference range applies only to samples taken after fasting for at least 8 hours.  HIV Antibody (routine testing w rflx)     Status: None   Collection Time: 03/17/20  8:48 AM  Result Value Ref Range   HIV Screen 4th Generation wRfx Non Reactive Non Reactive    Comment: Performed at [facility], [address] [place], [place] [address]  Comprehensive metabolic panel     Status: Abnormal   Collection Time: 03/17/20  8:48 AM  Result Value Ref Range   Sodium 139 135 - 145 mmol/L   Potassium 4.0 3.5 - 5.1 mmol/L   Chloride 103 98 - 111 mmol/L   CO2 25 22 - 32 mmol/L   Glucose, Bld 134 (H) 70 - 99 mg/dL    Comment: Glucose reference range applies only to samples taken after fasting for at least 8 hours.   BUN 12 8 - 23 mg/dL   Creatinine, Ser 0.96 0.44 - 1.00 mg/dL   Calcium 8.6 (L) 8.9 - 10.3 mg/dL   Total Protein 7.5 6.5 - 8.1 g/dL   Albumin 3.5 3.5 - 5.0 g/dL   AST 350 (H) 15 - 41 U/L   ALT 146 (H) 0 - 44 U/L   Alkaline Phosphatase 109 38 - 126 U/L   Total Bilirubin 3.0 (H) 0.3 - 1.2 mg/dL   GFR, Estimated >60 >60 mL/min    Comment: (NOTE) Calculated using the CKD-EPI Creatinine Equation (2021)    Anion gap 11 5 - 15    Comment: Performed at [facility], [place] [address], [place], [place] [address]  CBC with Differential/Platelet     Status: None   Collection Time: 03/17/20  8:48 AM  Result Value Ref Range    WBC 9.1 4.0 - 10.5 K/uL   RBC 4.45 3.87 - 5.11 MIL/uL   Hemoglobin 12.4 12.0 - 15.0 g/dL   HCT 39.6 36 - 46 %   MCV 89.0 80.0 - 100.0 fL   MCH 27.9 26.0 - 34.0 pg   MCHC 31.3 30.0 - 36.0 g/dL   RDW 14.0 11.5 - 15.5 %   Platelets 197 150 - 400 K/uL   nRBC 0.0 0.0 - 0.2 %   Neutrophils Relative % 75 %   Neutro Abs 6.8 1.7 - 7.7  K/uL   Lymphocytes Relative 19 %   Lymphs Abs 1.7 0.7 - 4.0 K/uL   Monocytes Relative 6 %   Monocytes Absolute 0.6 0.1 - 1.0 K/uL   Eosinophils Relative 0 %   Eosinophils Absolute 0.0 0.0 - 0.5 K/uL   Basophils Relative 0 %   Basophils Absolute 0.0 0.0 - 0.1 K/uL   Immature Granulocytes 0 %   Abs Immature Granulocytes 0.04 0.00 - 0.07 K/uL    Comment: Performed at [facility], [place] [address], [place], [place] [address]  Lipase, blood     Status: None   Collection Time: 03/17/20  8:48 AM  Result Value Ref Range   Lipase 32 11 - 51 U/L    Comment: Performed at [facility], [place] [address], [place], [place] [address]  Magnesium     Status: None   Collection Time: 03/17/20  8:48 AM  Result Value Ref Range   Magnesium 1.9 1.7 - 2.4 mg/dL    Comment: Performed at [facility], [place] [address], [place], [place] [address]  Glucose, capillary     Status: Abnormal   Collection Time: 03/17/20 11:51 AM  Result Value Ref Range   Glucose-Capillary 116 (H) 70 - 99 mg/dL    Comment: Glucose reference range applies only to samples taken after fasting for at least 8 hours.   *Note: Due to a large number of results and/or encounters for the requested time period, some results have not been displayed. A complete set of results can be found in Results Review.   NM Hepatobiliary Liver Func  Result Date: 03/17/2020 CLINICAL DATA:  Concern for acute cholecystitis. EXAM: NUCLEAR MEDICINE HEPATOBILIARY IMAGING TECHNIQUE: Sequential images of the abdomen were obtained out to 60 minutes following intravenous administration  of radiopharmaceutical. RADIOPHARMACEUTICALS:  5.5 mCi Tc-44m  Choletec IV COMPARISON:  Ultrasound 03/16/2020 FINDINGS: Prompt uptake by the liver is seen. Imaging was performed for 90 minutes with no biliary excretion identified. No small bowel or gallbladder activity identified. IMPRESSION: 1. There is diffuse tracer uptake by the liver with absent biliary excretion after 190 minutes. Imaging findings are consistent with either common bile duct obstruction or hepatocellular dysfunction secondary to hepatitis. Electronically Signed   By: Kerby Moors M.D.   On: 03/17/2020 14:51   CT ABDOMEN PELVIS W CONTRAST  Result Date: 03/16/2020 CLINICAL DATA:  Right upper quadrant pain. EXAM: CT ABDOMEN AND PELVIS WITH CONTRAST TECHNIQUE: Multidetector CT imaging of the abdomen and pelvis was performed using the standard protocol following bolus administration of intravenous contrast. CONTRAST:  168mL OMNIPAQUE IOHEXOL 300 MG/ML  SOLN COMPARISON:  March 26, 2017 FINDINGS: Lower chest: Very mild atelectasis is seen within the bilateral lung bases. Hepatobiliary: No focal liver abnormality is seen. The gallbladder is moderately distended. No gallstones are identified. Mild pericholecystic inflammatory fat stranding is seen. Pancreas: Unremarkable. No pancreatic ductal dilatation or surrounding inflammatory changes. Spleen: Normal in size without focal abnormality. Adrenals/Urinary Tract: Adrenal glands are unremarkable. Kidneys are normal, without renal calculi, focal lesion, or hydronephrosis. Bladder is unremarkable. Stomach/Bowel: Stomach is within normal limits. The appendix is not clearly identified. No evidence of bowel wall thickening, distention, or inflammatory changes. Vascular/Lymphatic: No significant vascular findings are present. No enlarged abdominal or pelvic lymph nodes. Reproductive: The uterus is normal in size and appearance. A stable 4.3 cm x 5.2 cm lobulated area of low attenuation is seen within  the posterior aspect of the left adnexa. Other: No abdominal wall hernia  or abnormality. No abdominopelvic ascites. Musculoskeletal: No acute or significant osseous findings. IMPRESSION: 1. Findings consistent with acalculous acute cholecystitis. Correlation with right upper quadrant ultrasound is recommended. 2. Stable left adnexal cyst, likely ovarian in origin. Further correlation with pelvic ultrasound is recommended. Electronically Signed   By: Virgina Norfolk M.D.   On: 03/16/2020 18:31   US Abdomen Limited RUQ (LIVER/GB)  Result Date: 03/16/2020 CLINICAL DATA:  Biliary colic symptoms, hypertension, diabetes EXAM: ULTRASOUND ABDOMEN LIMITED RIGHT UPPER QUADRANT COMPARISON:  CT abdomen pelvis 03/27/2017. FINDINGS: Gallbladder: Calcified stone and sludge noted within the gallbladder lumen. No pericholecystic fluid or wall thickening visualized. No sonographic Murphy sign noted by sonographer. Common bile duct: Diameter: 8 mm-upper limits of normal/borderline enlarged Liver: No focal lesion identified. Increased in parenchymal echogenicity. Portal vein is patent on color Doppler imaging with normal direction of blood flow towards the liver. Other: None. IMPRESSION: 1. Cholelithiasis and gallbladder sludge with no findings to suggest acute cholecystitis. 2. The common bile duct measures at the upper limits of normal/borderline enlarged (8 mm) with choledocholithiasis more distally not excluded. 3. Hepatic steatosis. Please note limited evaluation for focal hepatic masses in a patient with hepatic steatosis due to decreased penetration of the acoustic ultrasound waves. Electronically Signed   By: Iven Finn M.D.   On: 03/16/2020 19:46       Assessment/Plan Hx chronic pain GERD Type 2 diabetes -currently on no metformin Hypertension Hx lumbar laminectomy/tubal ligation Atrial fibrillation - on lopressor   RUQ pain with cholelithiasis/possible cholecystitis - CT and [place] suggestive of  possible cholecystitis but not definite - no leukocytosis and patient afebrile - LFTs and Tbili trending up - HIDA consistent with CBD obstruction vs hepatitis  - GI ordering MRCP - will check hepatitis panel as well  - ultimately patient may need her gallbladder out, but will await results of MRI and hep panel before deciding on this  FEN:  Ok to have CLD until MN and then would make NPO from a gen surgery standpoint  ID: Rocephin times 111/28/21; Zosyn 11/28  >> day 2 DVT: SCD, ok to have chemical DVT prophylaxis from a general surgery standpoint   Norm Parcel, [facility] Surgery 03/17/2020, 3:50 PM Please see Amion for pager number during day hours 7:00am-4:30pm

## 2020-03-17 NOTE — Progress Notes (Signed)
   03/17/20 0338  Assess: MEWS Score  Temp 100 F (37.8 C)  BP (!) 157/99  Pulse Rate (!) 132  Resp 18  Level of Consciousness Alert  SpO2 96 %  O2 Device Room Air  Assess: MEWS Score  MEWS Temp 0  MEWS Systolic 0  MEWS Pulse 3  MEWS RR 0  MEWS LOC 0  MEWS Score 3  MEWS Score Color Yellow  Assess: if the MEWS score is Yellow or Red  Were vital signs taken at a resting state? Yes  Focused Assessment No change from prior assessment  Early Detection of Sepsis Score *See Row Information* Low  MEWS guidelines implemented *See Row Information* Yes  Treat  MEWS Interventions Escalated (See documentation below);Administered prn meds/treatments  Pain Scale 0-10  Pain Score 3  Pain Location Abdomen  Pain Orientation Lower  Pain Descriptors / Indicators Sharp  Patients Stated Pain Goal 3  Take Vital Signs  Increase Vital Sign Frequency  Yellow: Q 2hr X 2 then Q 4hr X 2, if remains yellow, continue Q 4hrs  Escalate  MEWS: Escalate Yellow: discuss with charge nurse/RN and consider discussing with provider and RRT  Notify: Charge Nurse/RN  Name of Charge Nurse/RN Notified BETTY  Date Charge Nurse/RN Notified 03/17/20  Time Charge Nurse/RN Notified 0340  Notify: Provider  Provider Name/Title Ardith Dark  Date Provider Notified 03/17/20  Time Provider Notified 0345  Notification Type Page  Notification Reason Change in status  Response See new orders  Date of Provider Response 03/17/20  Time of Provider Response 0345  Document  Patient Outcome Stabilized after interventions

## 2020-03-17 NOTE — Plan of Care (Signed)
Alert oriented x 4 c/o ab pain. Abd soft tender to touch of upper middle abd pain 7/10 medicated as needed positive relieft. Will cont. To monitor

## 2020-03-17 NOTE — Progress Notes (Addendum)
PROGRESS NOTE    Norma [name]  KGY:[id number] DOB: 02-Feb-1961 DOA: 03/16/2020 PCP: [name], NP    Chief Complaint  Patient presents with  . Abdominal Pain    Brief Narrative:  HPI per [name] is a 59 y.o. female with medical history significant of type 2 diabetes not on meds, hypertension, depression, anxiety, GERD, chronic pain presenting with complaints of abdominal pain.  Patient stated he normally never gets abdominal pain after eating.  However, Friday night after eating dinner she experienced cramping generalized abdominal pain.  Today she is feeling pain mostly in her right upper quadrant.  She describes it as a cramping sensation that is constant.  She has felt nauseous but has not vomited.  Denies history of any gallbladder problems.  Denies fevers or chills.  She has been vaccinated against Covid.  Denies shortness of breath, cough, and chest pain.  ED Course: Blood pressure significantly elevated with systolic above 026, remainder of vital signs stable.  WBC 7.8, hemoglobin 13.4, hematocrit 42.0, platelet 211K.  Sodium 138, potassium 3.6, chloride 104, bicarb 25, BUN 12, creatinine 0.6, glucose 111.  Lipase and LFTs normal.  UA with negative nitrite, trace amount of leukocytes, 6-10 WBCs, and no bacteria.  Screening SARS-CoV-2 PCR test and influenza panel pending.  CT abdomen pelvis showing findings concerning for acalculous acute cholecystitis.  Stable left adnexal cyst, likely ovarian in origin.  Further correlation with pelvic ultrasound recommended.  Right upper quadrant abdominal ultrasound showing cholelithiasis and gallbladder sludge with no findings to suggest acute cholecystitis.  CBD diameter at the upper limit of normal/borderline enlarged at 8 mm with choledocholithiasis more distally not excluded.  Also showing evidence of hepatic steatosis.  Patient was given fentanyl, Dilaudid, morphine, Zofran, Zosyn, and ceftriaxone.  ED provider  discussed the case with on-call physician for [facility] who recommended obtaining a HIDA scan and if there is concern for cholecystitis/biliary colic then consult general surgery in the morning.  ERCP not recommended as LFTs are normal.  GI will see the patient in the morning.  Assessment & Plan:   Principal Problem:   Cholelithiasis Active Problems:   Depression   Abdominal pain   Hypertensive urgency   Type 2 diabetes mellitus ([place])  1 right upper quadrant abdominal pain/cholelithiasis Patient presented with severe exquisite right upper quadrant abdominal pain associated with food.  On admission lipase levels within normal limits.  LFTs were within normal limits however as significantly elevated today with a AST of 350 and ALT of 146.  Bilirubin elevated at 3.0 today.  CT abdomen and pelvis done on admission with findings consistent with a calculus acute cholecystitis, stable left adnexal cyst likely ovarian in origin.  Right upper quadrant abdominal ultrasound done with cholelithiasis and gallbladder sludge with no findings to suggest acute cholecystitis.  Common bile duct measures upper limits of normal 8 mm with choledocholithiasis more distally not excluded, hepatic steatosis.  HIDA scan obtained today with imaging findings consistent with either common bile duct obstruction or hepatocellular dysfunction secondary to hepatitis.  Check an acute hepatitis panel.  MRCP has been ordered per GI.  GI consultation pending.  May need surgical evaluation however will defer to GI.  IV fluids.  Patient received a dose of IV Rocephin and a dose of IV Zosyn in the ED currently not on any IV antibiotics.  Will place empirically on IV Zosyn pending evaluation by general surgery and GI.  Supportive care.  2.  Diet-controlled  diabetes mellitus type 2 versus prediabetes Not on any medications prior to admission.  Hemoglobin A1c 6.1.  CBG 121 this morning.  Change CBGs to every 6 hours.  Sliding scale  insulin.  3.  Hypertension/hypertensive urgency Resume home regimen HCTZ, Cozaar.  Hydralazine as needed  4.  Abnormal urinalysis Urine cultures pending.  Patient asymptomatic.  Follow.  5.  Left adnexal cyst Noted on CT abdomen and pelvis.  It was noted per admitting physician that patient was seen by OB/GYN in September 2020 for an endometrial mass, biopsies revealed atrophic endometrium with benign endometrial polyp, no atypia or malignancy.  Outpatient follow-up with OB/GYN.  6.  Depression/anxiety Stable.  Resume home regimen Wellbutrin, trazodone.  Outpatient follow-up.   DVT prophylaxis: SCDs Code Status: Full Family Communication: Updated patient.  No family at bedside. Disposition:   Status is: Observation    Dispo: The patient is from: Home              Anticipated d/c is to: Home              Anticipated d/c date is: 3 to 4 days.              Patient currently with exquisite right upper quadrant abdominal pain, under investigation, on IV pain medications, currently n.p.o.  Not stable for discharge.       Consultants:   Gastroenterology pending  Procedures:   CT abdomen and pelvis 03/16/2020  Right upper quadrant ultrasound 03/16/2020  HIDA scan 03/17/2020  Antimicrobials:   None   Subjective: Patient laying in bed with complaints of significant right upper quadrant abdominal pain.  Denies any emesis.  No nausea.  No chest pain.  No shortness of breath.  Awaiting HIDA scan at the time of interview.  Objective: Vitals:   03/17/20 0544 03/17/20 0749 03/17/20 1143 03/17/20 1547  BP: (!) 146/78 (!) 155/90 (!) 143/89 137/82  Pulse: (!) 108 (!) 103 73 78  Resp: 18 18 18    Temp: 99.1 F (37.3 C) 98.9 F (37.2 C) 97.6 F (36.4 C) 97.9 F (36.6 C)  TempSrc:  Oral Oral   SpO2: 92% 97% 98% 95%  Weight: 114 kg     Height: 5\' 8"  (1.727 m)       Intake/Output Summary (Last 24 hours) at 03/17/2020 1604 Last data filed at 03/17/2020 1500 Gross per 24  hour  Intake 554.95 ml  Output --  Net 554.95 ml   Filed Weights   03/17/20 0544  Weight: 114 kg    Examination:  General exam: Appears calm and comfortable  Respiratory system: Clear to auscultation. Respiratory effort normal. Cardiovascular system: S1 & S2 heard, RRR. No JVD, murmurs, rubs, gallops or clicks. No pedal edema. Gastrointestinal system: Abdomen is nondistended, soft and exquisite tenderness to palpation right upper quadrant.  Positive bowel sounds.  No rebound.  No guarding.  Central nervous system: Alert and oriented. No focal neurological deficits. Extremities: Symmetric 5 x 5 power. Skin: No rashes, lesions or ulcers Psychiatry: Judgement and insight appear normal. Mood & affect appropriate.     Data Reviewed: I have personally reviewed following labs and imaging studies  CBC: Recent Labs  Lab 03/16/20 1648 03/17/20 0848  WBC 7.8 9.1  NEUTROABS  --  6.8  HGB 13.4 12.4  HCT 42.0 39.6  MCV 87.3 89.0  PLT 211 676    Basic Metabolic Panel: Recent Labs  Lab 03/16/20 1648 03/17/20 0848  NA 138 139  K 3.6 4.0  CL 104 103  CO2 25 25  GLUCOSE 111* 134*  BUN 12 12  CREATININE 0.60 0.96  CALCIUM 9.4 8.6*  MG  --  1.9    GFR: Estimated Creatinine Clearance: 81.5 mL/min (by C-G formula based on SCr of 0.96 mg/dL).  Liver Function Tests: Recent Labs  Lab 03/16/20 1648 03/17/20 0848  AST 31 350*  ALT 22 146*  ALKPHOS 63 109  BILITOT 0.8 3.0*  PROT 8.0 7.5  ALBUMIN 4.2 3.5    CBG: Recent Labs  Lab 03/17/20 0001 03/17/20 0358 03/17/20 0745 03/17/20 1151  GLUCAP 102* 135* 121* 116*     Recent Results (from the past 240 hour(s))  Resp Panel by RT-PCR (Flu A&B, Covid) Nasopharyngeal Swab     Status: None   Collection Time: 03/16/20  8:21 PM   Specimen: Nasopharyngeal Swab; Nasopharyngeal(NP) swabs in vial transport medium  Result Value Ref Range Status   SARS Coronavirus 2 by RT PCR NEGATIVE NEGATIVE Final    Comment:  (NOTE) SARS-CoV-2 target nucleic acids are NOT DETECTED.  The SARS-CoV-2 RNA is generally detectable in upper respiratory specimens during the acute phase of infection. The lowest concentration of SARS-CoV-2 viral copies this assay can detect is 138 copies/mL. A negative result does not preclude SARS-Cov-2 infection and should not be used as the sole basis for treatment or other patient management decisions. A negative result may occur with  improper specimen collection/handling, submission of specimen other than nasopharyngeal swab, presence of viral mutation(s) within the areas targeted by this assay, and inadequate number of viral copies(<138 copies/mL). A negative result must be combined with clinical observations, patient history, and epidemiological information. The expected result is Negative.  Fact Sheet for Patients:  EntrepreneurPulse.com.au  Fact Sheet for Healthcare Providers:  IncredibleEmployment.be  This test is no t yet approved or cleared by the [place] FDA and  has been authorized for detection and/or diagnosis of SARS-CoV-2 by FDA under an Emergency Use Authorization (EUA). This EUA will remain  in effect (meaning this test can be used) for the duration of the COVID-19 declaration under Section 564(b)(1) of the Act, 21 U.S.C.section 360bbb-3(b)(1), unless the authorization is terminated  or revoked sooner.       Influenza A by PCR NEGATIVE NEGATIVE Final   Influenza B by PCR NEGATIVE NEGATIVE Final    Comment: (NOTE) The Xpert Xpress SARS-CoV-2/FLU/RSV plus assay is intended as an aid in the diagnosis of influenza from Nasopharyngeal swab specimens and should not be used as a sole basis for treatment. Nasal washings and aspirates are unacceptable for Xpert Xpress SARS-CoV-2/FLU/RSV testing.  Fact Sheet for Patients: EntrepreneurPulse.com.au  Fact Sheet for Healthcare  Providers: IncredibleEmployment.be  This test is not yet approved or cleared by the [place] FDA and has been authorized for detection and/or diagnosis of SARS-CoV-2 by FDA under an Emergency Use Authorization (EUA). This EUA will remain in effect (meaning this test can be used) for the duration of the COVID-19 declaration under Section 564(b)(1) of the Act, 21 U.S.C. section 360bbb-3(b)(1), unless the authorization is terminated or revoked.  Performed at [facility], [place] [address], [place], [place] [address]          Radiology Studies: NM Hepatobiliary Liver Func  Result Date: 03/17/2020 CLINICAL DATA:  Concern for acute cholecystitis. EXAM: NUCLEAR MEDICINE HEPATOBILIARY IMAGING TECHNIQUE: Sequential images of the abdomen were obtained out to 60 minutes following intravenous administration of radiopharmaceutical. RADIOPHARMACEUTICALS:  5.5 mCi Tc-14m  Choletec IV COMPARISON:  Ultrasound 03/16/2020 FINDINGS: Prompt uptake by the liver is seen. Imaging was performed for 90 minutes with no biliary excretion identified. No small bowel or gallbladder activity identified. IMPRESSION: 1. There is diffuse tracer uptake by the liver with absent biliary excretion after 190 minutes. Imaging findings are consistent with either common bile duct obstruction or hepatocellular dysfunction secondary to hepatitis. Electronically Signed   By: Kerby Moors M.D.   On: 03/17/2020 14:51   CT ABDOMEN PELVIS W CONTRAST  Result Date: 03/16/2020 CLINICAL DATA:  Right upper quadrant pain. EXAM: CT ABDOMEN AND PELVIS WITH CONTRAST TECHNIQUE: Multidetector CT imaging of the abdomen and pelvis was performed using the standard protocol following bolus administration of intravenous contrast. CONTRAST:  151mL OMNIPAQUE IOHEXOL 300 MG/ML  SOLN COMPARISON:  March 26, 2017 FINDINGS: Lower chest: Very mild atelectasis is seen within the bilateral lung bases. Hepatobiliary: No  focal liver abnormality is seen. The gallbladder is moderately distended. No gallstones are identified. Mild pericholecystic inflammatory fat stranding is seen. Pancreas: Unremarkable. No pancreatic ductal dilatation or surrounding inflammatory changes. Spleen: Normal in size without focal abnormality. Adrenals/Urinary Tract: Adrenal glands are unremarkable. Kidneys are normal, without renal calculi, focal lesion, or hydronephrosis. Bladder is unremarkable. Stomach/Bowel: Stomach is within normal limits. The appendix is not clearly identified. No evidence of bowel wall thickening, distention, or inflammatory changes. Vascular/Lymphatic: No significant vascular findings are present. No enlarged abdominal or pelvic lymph nodes. Reproductive: The uterus is normal in size and appearance. A stable 4.3 cm x 5.2 cm lobulated area of low attenuation is seen within the posterior aspect of the left adnexa. Other: No abdominal wall hernia or abnormality. No abdominopelvic ascites. Musculoskeletal: No acute or significant osseous findings. IMPRESSION: 1. Findings consistent with acalculous acute cholecystitis. Correlation with right upper quadrant ultrasound is recommended. 2. Stable left adnexal cyst, likely ovarian in origin. Further correlation with pelvic ultrasound is recommended. Electronically Signed   By: Virgina Norfolk M.D.   On: 03/16/2020 18:31   US Abdomen Limited RUQ (LIVER/GB)  Result Date: 03/16/2020 CLINICAL DATA:  Biliary colic symptoms, hypertension, diabetes EXAM: ULTRASOUND ABDOMEN LIMITED RIGHT UPPER QUADRANT COMPARISON:  CT abdomen pelvis 03/27/2017. FINDINGS: Gallbladder: Calcified stone and sludge noted within the gallbladder lumen. No pericholecystic fluid or wall thickening visualized. No sonographic Murphy sign noted by sonographer. Common bile duct: Diameter: 8 mm-upper limits of normal/borderline enlarged Liver: No focal lesion identified. Increased in parenchymal echogenicity. Portal vein  is patent on color Doppler imaging with normal direction of blood flow towards the liver. Other: None. IMPRESSION: 1. Cholelithiasis and gallbladder sludge with no findings to suggest acute cholecystitis. 2. The common bile duct measures at the upper limits of normal/borderline enlarged (8 mm) with choledocholithiasis more distally not excluded. 3. Hepatic steatosis. Please note limited evaluation for focal hepatic masses in a patient with hepatic steatosis due to decreased penetration of the acoustic ultrasound waves. Electronically Signed   By: Iven Finn M.D.   On: 03/16/2020 19:46        Scheduled Meds: . buPROPion  300 mg Oral Daily  . fluticasone  1 spray Each Nare Daily  . gabapentin  100 mg Oral QHS  . hydrochlorothiazide  25 mg Oral Daily  . insulin aspart  0-9 Units Subcutaneous Q6H  . ketorolac  30 mg Intravenous Once  . loratadine  10 mg Oral Daily  . losartan  50 mg Oral Daily  . metoprolol tartrate  25 mg Oral BID  . multivitamin-iron-minerals-folic acid  1 tablet Oral Daily  .  pantoprazole  80 mg Oral Daily  . potassium chloride  20 mEq Oral Daily  . traZODone  100 mg Oral QHS  . [START ON 03/18/2020] Vitamin D (Ergocalciferol)  50,000 Units Oral Q7 days   Continuous Infusions: . sodium chloride       LOS: 1 day    Time spent: 35 minutes    Irine Seal, MD [facility]   To contact the attending provider between 7A-7P or the covering provider during after hours 7P-7A, please log into the web site [email or web address] and access using universal [place] password for that web site. If you do not have the password, please call the hospital operator.  03/17/2020, 4:04 PM

## 2020-03-17 NOTE — Consult Note (Addendum)
Referring Provider:  [facility]         Primary Care Physician:  [name], NP Primary Gastroenterologist:   Harl Bowie, MD           We were asked to see this patient for:   Abdominal pain              ASSESSMENT / PLAN:    # Acute on chronic elevated liver chemistries / RUQ pain.  Historically liver enzymes have been either normal or less than 2 x ULN. Now with AST 350, ALT 146, bilirubin 3, normal alkaline phosphatase. [place] remarkable for cholelithiasis and gallbladder sludge but no findings of acute cholecystitis. CBD upper limit of normal at 8 mm.  CT scan suggestive of acalculous acute cholecystitis. HIDA scan today shows no biliary tracer after 190 minutes suggestive of hepatocellular dysfunction or CBD obstruction.   --Obtain MRCP, hopefully can be done today. I just spoke with Radiology and they are planning on MRCP at 6 pm. Keep NPO.  --Am labs --I have called [facility] for their input.       HPI:                                                                                                                             Chief Complaint: RUQ pain  Norma [name] is a 59 y.o. female with a pmh significant for, not necessarily limited to:  Obesity, HTN, GERD, DM, adenomatous colon polyps  Patient developed stabbing , non-radiating RUQ pain early Saturday morning. She tried Tums and Tylenol but they didn't help. Pain did get better, she went to work but then had recurrent RUQ pain yesterday afternoon. Paitent presented to ED yesterday. Liver chemistries were markedly elevated. [place] notable for cholelithiasis, no cholecystitis. CBD 8 mm, hepatic steatosis. CT scan suggested acalculous cholecystitis. WBC is normal. Afebrile. Patient has never had this type of pain before. She has associated nausea without vomiting. She gives a history of IBS with loose stools but BMs at baseline.     PREVIOUS ENDOSCOPIC EVALUATIONS / [place] STUDIES   Oct 2019 colonoscopy Two 12 to 14  mm polyps in the descending colon and in the transverse colon, removed with a hot snare. Resected and retrieved. - One 5 mm polyp in the transverse colon, removed with a cold snare. Resected and retrieved. - Diverticulosis in the sigmoid colon, in the descending colon and in the ascending colon. - Non-bleeding internal hemorrhoids.  Path =   Multiple tubular adenoma fragments   03/16/20 RUQ US IMPRESSION: 1. Cholelithiasis and gallbladder sludge with no findings to suggest acute cholecystitis. 2. The common bile duct measures at the upper limits of normal/borderline enlarged (8 mm) with choledocholithiasis more distally not excluded. 3. Hepatic steatosis. Please note limited evaluation for focal hepatic masses in a patient with hepatic steatosis due to decreased penetration of the acoustic ultrasound waves.   IMPRESSION: 1. Findings consistent with acalculous acute cholecystitis. Correlation  with right upper quadrant ultrasound is recommended. 2. Stable left adnexal cyst, likely ovarian in origin. Further correlation with pelvic ultrasound is recommended. IMPRESSION: 1. Cholelithiasis and gallbladder sludge with no findings to suggest acute cholecystitis. 2. The common bile duct measures at the upper limits of normal/borderline enlarged (8 mm) with choledocholithiasis more distally not excluded. 3. Hepatic steatosis. Please note limited evaluation for focal hepatic masses in a patient with hepatic steatosis due to decreased penetration of the acoustic ultrasound waves.    03/16/20 CT scan  abd/ pelvis with contrast IMPRESSION: 1. Findings consistent with acalculous acute cholecystitis. Correlation with right upper quadrant ultrasound is recommended. 2. Stable left adnexal cyst, likely ovarian in origin. Further correlation with pelvic ultrasound is recommended. IMPRESSION: 1. Cholelithiasis and gallbladder sludge with no findings to suggest acute cholecystitis. 2. The  common bile duct measures at the upper limits of normal/borderline enlarged (8 mm) with choledocholithiasis more distally not excluded. 3. Hepatic steatosis. Please note limited evaluation for focal hepatic masses in a patient with hepatic steatosis due to decreased penetration of the acoustic ultrasound waves.   Past Medical History:  Diagnosis Date  . A-fib ([place])   . Acid reflux   . Allergy   . Anemia   . Anxiety   . Arthritis   . Back pain   . Depression   . Diabetes mellitus without complication ([place])    on meds  . Dysrhythmia   . Headache    stress headaches, migraines at times  . Heart murmur   . Hypertension   . Irregular heart beat   . Left shoulder pain   . Shortness of breath dyspnea    with exertion  . Tachyarrhythmia   . Trigger point of thoracic region 03/22/2012    Past Surgical History:  Procedure Laterality Date  . ANKLE SURGERY    . KNEE SURGERY Left   . LUMBAR LAMINECTOMY/DECOMPRESSION MICRODISCECTOMY Left 06/06/2014   Procedure: LUMBAR LAMINECTOMY/DECOMPRESSION MICRODISCECTOMY 1 LEVEL;  Surgeon: Newman Pies, MD;  Location: [facility];  Service: Neurosurgery;  Laterality: Left;  Left L5S1 microdiskectomy  . ROTATOR CUFF REPAIR Left 02/20/14  . TUBAL LIGATION      Prior to Admission medications   Medication Sig Start Date End Date Taking? Authorizing Provider  benzonatate (TESSALON) 100 MG capsule Take 1 capsule (100 mg total) by mouth every 8 (eight) hours. 01/30/20  Yes Hall-Potvin, [place], PA-C  buPROPion (WELLBUTRIN XL) 300 MG 24 hr tablet Take 300 mg by mouth daily.   Yes [provider]  cetirizine (ZYRTEC ALLERGY) 10 MG tablet Take 1 tablet (10 mg total) by mouth daily. 01/30/20  Yes Hall-Potvin, [place], PA-C  cyclobenzaprine (FLEXERIL) 10 MG tablet TAKE 1 TABLET BY MOUTH TWICE A DAY AS NEEDED FOR MUSCLE SPASMS 12/23/17  Yes Meccariello, Bernita Raisin, DO  diclofenac Sodium (VOLTAREN) 1 % GEL Please apply 1-2 times daily to affected  area 10/09/19  Yes Lockamy, Timothy, DO  fluticasone (FLONASE) 50 MCG/ACT nasal spray Place 2 sprays into both nostrils daily. 10/15/19  Yes Meccariello, Bernita Raisin, DO  fluticasone (FLONASE) 50 MCG/ACT nasal spray Place 1 spray into both nostrils daily. 01/30/20  Yes Hall-Potvin, [place], PA-C  gabapentin (NEURONTIN) 100 MG capsule Take 1 capsule (100 mg total) by mouth at bedtime. 05/25/19  Yes Meccariello, Bernita Raisin, DO  hydrochlorothiazide (HYDRODIURIL) 25 MG tablet Take 1 tablet (25 mg total) by mouth daily. 11/02/16  Yes Everrett Coombe, MD  losartan (COZAAR) 50 MG tablet Take 50 mg by mouth  daily. 01/24/20  Yes [provider]  metFORMIN (GLUCOPHAGE) 500 MG tablet Take 500 mg by mouth 2 (two) times daily. 12/17/19  Yes [provider]  metoprolol tartrate (LOPRESSOR) 25 MG tablet Take 1 tablet (25 mg total) by mouth 2 (two) times daily. 05/13/14  Yes [name], MD  multivitamin-iron-minerals-folic acid (CENTRUM) chewable tablet Chew 1 tablet daily by mouth. 03/01/17  Yes [name], [name], MD  naproxen (NAPROSYN) 500 MG tablet Take 1 tablet (500 mg total) by mouth 2 (two) times daily with a meal. 10/09/19  Yes Lockamy, Timothy, DO  omeprazole (PRILOSEC) 20 MG capsule Take 2 capsules (40 mg total) by mouth daily. 05/25/19  Yes Meccariello, Bernita Raisin, DO  oxyCODONE-acetaminophen (PERCOCET) 10-325 MG tablet Take 1 tablet by mouth 3 (three) times daily. 07/23/19  Yes [provider]  potassium chloride (KLOR-CON) 10 MEQ tablet Take 10 mEq by mouth 2 (two) times daily. 01/24/20  Yes [provider]  traZODone (DESYREL) 100 MG tablet TAKE 2 TABLETS BY MOUTH ONCE AT BEDTIME 05/31/19  Yes Meccariello, Bernita Raisin, DO  Vitamin D, Ergocalciferol, (DRISDOL) 50000 units CAPS capsule TAKE 1 CAPSULE BY MOUTH EVERY 7 (SEVEN) DAYS. 12/09/17  Yes Meccariello, Bernita Raisin, DO  diclofenac (VOLTAREN) 75 MG EC tablet Take 1 tablet (75 mg total) by mouth 2 (two) times daily. Patient not taking:  Reported on 03/16/2020 11/14/19   Milus Banister C, DO  ferrous sulfate 325 (65 FE) MG tablet Take 1 tablet (325 mg total) by mouth 2 (two) times daily with a meal. 02/22/19 04/14/19  Shirley, [place], DO  loratadine (CLARITIN) 10 MG tablet Take 1 tablet (10 mg total) by mouth daily. 04/11/19 08/12/19  Meccariello, Bernita Raisin, DO  traZODone (DESYREL) 100 MG tablet TAKE 2 TABLETS BY MOUTH ONCE AT BEDTIME 09/01/18   Daisy Floro, DO    Current Facility-Administered Medications  Medication Dose Route Frequency Provider Last Rate Last Admin  . 0.9 %  sodium chloride infusion   Intravenous Continuous Shela Leff, MD 125 mL/hr at 03/17/20 0151 New Bag at 03/17/20 0151  . acetaminophen (TYLENOL) tablet 650 mg  650 mg Oral Q6H PRN Shela Leff, MD   650 mg at 03/17/20 0422   Or  . acetaminophen (TYLENOL) suppository 650 mg  650 mg Rectal Q6H PRN Shela Leff, MD      . buPROPion (WELLBUTRIN XL) 24 hr tablet 300 mg  300 mg Oral Daily Eugenie Filler, MD   300 mg at 03/17/20 0917  . cyclobenzaprine (FLEXERIL) tablet 10 mg  10 mg Oral BID PRN Eugenie Filler, MD      . fluticasone Asencion Islam) 50 MCG/ACT nasal spray 1 spray  1 spray Each Nare Daily Eugenie Filler, MD      . gabapentin (NEURONTIN) capsule 100 mg  100 mg Oral QHS Eugenie Filler, MD      . hydrALAZINE (APRESOLINE) injection 5 mg  5 mg Intravenous Q4H PRN Shela Leff, MD      . hydrochlorothiazide (HYDRODIURIL) tablet 25 mg  25 mg Oral Daily Eugenie Filler, MD   25 mg at 03/17/20 0915  . insulin aspart (novoLOG) injection 0-9 Units  0-9 Units Subcutaneous Q4H Shela Leff, MD      . loratadine (CLARITIN) tablet 10 mg  10 mg Oral Daily Eugenie Filler, MD   10 mg at 03/17/20 0917  . losartan (COZAAR) tablet 50 mg  50 mg Oral Daily Eugenie Filler, MD   50 mg  at 03/17/20 0916  . metoprolol tartrate (LOPRESSOR) tablet 25 mg  25 mg Oral BID Eugenie Filler, MD   25 mg at 03/17/20 0917  .  morphine 2 MG/ML injection 2 mg  2 mg Intravenous Q4H PRN Eugenie Filler, MD      . multivitamin-iron-minerals-folic acid (CENTRUM) chewable tablet 1 tablet  1 tablet Oral Daily Eugenie Filler, MD      . ondansetron [facility]) injection 4 mg  4 mg Intravenous Q6H PRN Shela Leff, MD      . pantoprazole (PROTONIX) EC tablet 80 mg  80 mg Oral Daily Eugenie Filler, MD   80 mg at 03/17/20 0918  . potassium chloride SA (KLOR-CON) CR tablet 20 mEq  20 mEq Oral Daily Eugenie Filler, MD   20 mEq at 03/17/20 0916  . traZODone (DESYREL) tablet 100 mg  100 mg Oral QHS Eugenie Filler, MD      . Derrill Memo ON 03/18/2020] Vitamin D (Ergocalciferol) (DRISDOL) capsule 50,000 Units  50,000 Units Oral Q7 days Eugenie Filler, MD        Allergies as of 03/16/2020 - Review Complete 03/16/2020  Allergen Reaction Noted  . Soma [carisoprodol] Itching 06/26/2012  . Nortriptyline Rash 04/03/2011  . Prednisone Rash 07/13/2007    Family History  Problem Relation Age of Onset  . Diabetes Brother   . Diabetes Maternal Aunt   . Diabetes Paternal Aunt   . Diabetes Maternal Grandmother   . Diabetes Maternal Grandfather   . Diabetes Paternal Grandmother   . Diabetes Paternal Grandfather   . Congestive Heart Failure Mother   . Hypertension Son   . Colon cancer Neg Hx   . Esophageal cancer Neg Hx   . Rectal cancer Neg Hx   . Stomach cancer Neg Hx     Social History   Socioeconomic History  . Marital status: Single    Spouse name: Not on file  . Number of children: 4  . Years of education: 73  . Highest education level: 12th grade  Occupational History  . Not on file  Tobacco Use  . Smoking status: Never Smoker  . Smokeless tobacco: Never Used  Vaping Use  . Vaping Use: Never used  Substance and Sexual Activity  . Alcohol use: Yes    Comment: glass of wine occasionally  . Drug use: No    Comment: States no longer uses marijuana  . Sexual activity: Yes    Birth  control/protection: Condom  Other Topics Concern  . Not on file  Social History Narrative   Lives by herself, 22 year old son comes and goes. Has a pitbull dog named Duchess. Lives in one level house with two steps in back. Going to start going back to church. Likes to go out to eat and go to movies. Watches grandchildren, has six with two expected. Has occasional food insecurity.    Social Determinants of Health   Financial Resource Strain:   . Difficulty of Paying Living Expenses: Not on file  Food Insecurity:   . Worried About Charity fundraiser in the Last Year: Not on file  . Ran Out of Food in the Last Year: Not on file  Transportation Needs:   . Lack of Transportation (Medical): Not on file  . Lack of Transportation (Non-Medical): Not on file  Physical Activity:   . Days of Exercise per Week: Not on file  . Minutes of Exercise per Session: Not on file  Stress:   .  Feeling of Stress : Not on file  Social Connections:   . Frequency of Communication with Friends and Family: Not on file  . Frequency of Social Gatherings with Friends and Family: Not on file  . Attends Religious Services: Not on file  . Active Member of Clubs or Organizations: Not on file  . Attends Archivist Meetings: Not on file  . Marital Status: Not on file  Intimate Partner Violence:   . Fear of Current or Ex-Partner: Not on file  . Emotionally Abused: Not on file  . Physically Abused: Not on file  . Sexually Abused: Not on file    Review of Systems: All systems reviewed and negative except where noted in HPI.    OBJECTIVE:    Physical Exam: Vital signs in last 24 hours: Temp:  [97.9 F (36.6 C)-100 F (37.8 C)] 98.9 F (37.2 C) (11/29 0749) Pulse Rate:  [87-132] 103 (11/29 0749) Resp:  [18-22] 18 (11/29 0749) BP: (146-211)/(78-136) 155/90 (11/29 0749) SpO2:  [92 %-100 %] 97 % (11/29 0749) Weight:  [191 kg] 114 kg (11/29 0544) Last BM Date: 03/15/20 General:   Alert  female in NAD Psych:  Pleasant, cooperative. Normal mood and affect. Eyes:  Pupils equal, sclera clear, no icterus.   Conjunctiva pink. Ears:  Normal auditory acuity. Nose:  No deformity, discharge,  or lesions. Neck:  Supple; no masses Lungs:  Clear throughout to auscultation.   No wheezes, crackles, or rhonchi.  Heart:  Regular rate and rhythm; no murmurs, no lower extremity edema Abdomen:  Soft, non-distended, moderate RUQ tenderness, BS active, no palp mass   Rectal:  Deferred  Msk:  Symmetrical without gross deformities. . Neurologic:  Alert and  oriented x4;  grossly normal neurologically. Skin:  Intact without significant lesions or rashes.  Filed Weights   03/17/20 0544  Weight: 114 kg     Scheduled inpatient medications . buPROPion  300 mg Oral Daily  . fluticasone  1 spray Each Nare Daily  . gabapentin  100 mg Oral QHS  . hydrochlorothiazide  25 mg Oral Daily  . insulin aspart  0-9 Units Subcutaneous Q4H  . loratadine  10 mg Oral Daily  . losartan  50 mg Oral Daily  . metoprolol tartrate  25 mg Oral BID  . multivitamin-iron-minerals-folic acid  1 tablet Oral Daily  . pantoprazole  80 mg Oral Daily  . potassium chloride  20 mEq Oral Daily  . traZODone  100 mg Oral QHS  . [START ON 03/18/2020] Vitamin D (Ergocalciferol)  50,000 Units Oral Q7 days      Intake/Output from previous day: 11/28 0701 - 11/29 0700 In: 455 [P.O.:30; I.V.:325; IV Piggyback:100] Out: -  Intake/Output this shift: No intake/output data recorded.   Lab Results: Recent Labs    03/16/20 1648 03/17/20 0848  WBC 7.8 9.1  HGB 13.4 12.4  HCT 42.0 39.6  PLT 211 197   BMET Recent Labs    03/16/20 1648 03/17/20 0848  NA 138 139  K 3.6 4.0  CL 104 103  CO2 25 25  GLUCOSE 111* 134*  BUN 12 12  CREATININE 0.60 0.96  CALCIUM 9.4 8.6*   LFT Recent Labs    03/17/20 0848  PROT 7.5  ALBUMIN 3.5  AST 350*  ALT 146*  ALKPHOS 109  BILITOT 3.0*   PT/INR No results for input(s):  LABPROT, INR in the last 72 hours. Hepatitis Panel No results for input(s): HEPBSAG, HCVAB, [place], HEPBIGM in the last  72 hours.   . CBC Latest Ref Rng & Units 03/17/2020 03/16/2020 05/02/2019  WBC 4.0 - 10.5 K/uL 9.1 7.8 7.0  Hemoglobin 12.0 - 15.0 g/dL 12.4 13.4 13.5  Hematocrit 36 - 46 % 39.6 42.0 41.0  Platelets 150 - 400 K/uL 197 211 213    . CMP Latest Ref Rng & Units 03/17/2020 03/16/2020 05/02/2019  Glucose 70 - 99 mg/dL 134(H) 111(H) 101(H)  BUN 8 - 23 mg/dL 12 12 11   Creatinine 0.44 - 1.00 mg/dL 0.96 0.60 0.81  Sodium 135 - 145 mmol/L 139 138 140  Potassium 3.5 - 5.1 mmol/L 4.0 3.6 3.9  Chloride 98 - 111 mmol/L 103 104 102  CO2 22 - 32 mmol/L 25 25 25   Calcium 8.9 - 10.3 mg/dL 8.6(L) 9.4 9.7  Total Protein 6.5 - 8.1 g/dL 7.5 8.0 7.3  Total Bilirubin 0.3 - 1.2 mg/dL 3.0(H) 0.8 0.6  Alkaline Phos 38 - 126 U/L 109 63 66  AST 15 - 41 U/L 350(H) 31 31  ALT 0 - 44 U/L 146(H) 22 24   Studies/Results: CT ABDOMEN PELVIS W CONTRAST  Result Date: 03/16/2020 CLINICAL DATA:  Right upper quadrant pain. EXAM: CT ABDOMEN AND PELVIS WITH CONTRAST TECHNIQUE: Multidetector CT imaging of the abdomen and pelvis was performed using the standard protocol following bolus administration of intravenous contrast. CONTRAST:  131mL OMNIPAQUE IOHEXOL 300 MG/ML  SOLN COMPARISON:  March 26, 2017 FINDINGS: Lower chest: Very mild atelectasis is seen within the bilateral lung bases. Hepatobiliary: No focal liver abnormality is seen. The gallbladder is moderately distended. No gallstones are identified. Mild pericholecystic inflammatory fat stranding is seen. Pancreas: Unremarkable. No pancreatic ductal dilatation or surrounding inflammatory changes. Spleen: Normal in size without focal abnormality. Adrenals/Urinary Tract: Adrenal glands are unremarkable. Kidneys are normal, without renal calculi, focal lesion, or hydronephrosis. Bladder is unremarkable. Stomach/Bowel: Stomach is within normal limits. The  appendix is not clearly identified. No evidence of bowel wall thickening, distention, or inflammatory changes. Vascular/Lymphatic: No significant vascular findings are present. No enlarged abdominal or pelvic lymph nodes. Reproductive: The uterus is normal in size and appearance. A stable 4.3 cm x 5.2 cm lobulated area of low attenuation is seen within the posterior aspect of the left adnexa. Other: No abdominal wall hernia or abnormality. No abdominopelvic ascites. Musculoskeletal: No acute or significant osseous findings. IMPRESSION: 1. Findings consistent with acalculous acute cholecystitis. Correlation with right upper quadrant ultrasound is recommended. 2. Stable left adnexal cyst, likely ovarian in origin. Further correlation with pelvic ultrasound is recommended. Electronically Signed   By: Virgina Norfolk M.D.   On: 03/16/2020 18:31   US Abdomen Limited RUQ (LIVER/GB)  Result Date: 03/16/2020 CLINICAL DATA:  Biliary colic symptoms, hypertension, diabetes EXAM: ULTRASOUND ABDOMEN LIMITED RIGHT UPPER QUADRANT COMPARISON:  CT abdomen pelvis 03/27/2017. FINDINGS: Gallbladder: Calcified stone and sludge noted within the gallbladder lumen. No pericholecystic fluid or wall thickening visualized. No sonographic Murphy sign noted by sonographer. Common bile duct: Diameter: 8 mm-upper limits of normal/borderline enlarged Liver: No focal lesion identified. Increased in parenchymal echogenicity. Portal vein is patent on color Doppler imaging with normal direction of blood flow towards the liver. Other: None. IMPRESSION: 1. Cholelithiasis and gallbladder sludge with no findings to suggest acute cholecystitis. 2. The common bile duct measures at the upper limits of normal/borderline enlarged (8 mm) with choledocholithiasis more distally not excluded. 3. Hepatic steatosis. Please note limited evaluation for focal hepatic masses in a patient with hepatic steatosis due to decreased penetration of the acoustic  ultrasound waves. Electronically Signed   By: Iven Finn M.D.   On: 03/16/2020 19:46    Principal Problem:   Cholelithiasis Active Problems:   Depression   Abdominal pain   Hypertensive urgency   Type 2 diabetes mellitus ([place])    Tye Savoy, NP-C @  03/17/2020, 10:09 AM

## 2020-03-18 DIAGNOSIS — R748 Abnormal levels of other serum enzymes: Secondary | ICD-10-CM | POA: Diagnosis not present

## 2020-03-18 DIAGNOSIS — R932 Abnormal findings on diagnostic imaging of liver and biliary tract: Secondary | ICD-10-CM | POA: Diagnosis not present

## 2020-03-18 DIAGNOSIS — F32A Depression, unspecified: Secondary | ICD-10-CM | POA: Diagnosis not present

## 2020-03-18 DIAGNOSIS — K838 Other specified diseases of biliary tract: Secondary | ICD-10-CM | POA: Diagnosis not present

## 2020-03-18 DIAGNOSIS — R768 Other specified abnormal immunological findings in serum: Secondary | ICD-10-CM

## 2020-03-18 DIAGNOSIS — R945 Abnormal results of liver function studies: Secondary | ICD-10-CM

## 2020-03-18 DIAGNOSIS — I160 Hypertensive urgency: Secondary | ICD-10-CM | POA: Diagnosis not present

## 2020-03-18 DIAGNOSIS — K8050 Calculus of bile duct without cholangitis or cholecystitis without obstruction: Secondary | ICD-10-CM | POA: Diagnosis not present

## 2020-03-18 LAB — CBC
HCT: 39.6 % (ref 36.0–46.0)
Hemoglobin: 12.5 g/dL (ref 12.0–15.0)
MCH: 27.7 pg (ref 26.0–34.0)
MCHC: 31.6 g/dL (ref 30.0–36.0)
MCV: 87.8 fL (ref 80.0–100.0)
Platelets: 181 10*3/uL (ref 150–400)
RBC: 4.51 MIL/uL (ref 3.87–5.11)
RDW: 14.2 % (ref 11.5–15.5)
WBC: 6.3 10*3/uL (ref 4.0–10.5)
nRBC: 0 % (ref 0.0–0.2)

## 2020-03-18 LAB — HEPATITIS PANEL, ACUTE
HCV Ab: REACTIVE — AB
Hep A IgM: NONREACTIVE
Hep B C IgM: NONREACTIVE
Hepatitis B Surface Ag: NONREACTIVE

## 2020-03-18 LAB — COMPREHENSIVE METABOLIC PANEL
ALT: 227 U/L — ABNORMAL HIGH (ref 0–44)
AST: 322 U/L — ABNORMAL HIGH (ref 15–41)
Albumin: 3.6 g/dL (ref 3.5–5.0)
Alkaline Phosphatase: 112 U/L (ref 38–126)
Anion gap: 10 (ref 5–15)
BUN: 10 mg/dL (ref 8–23)
CO2: 24 mmol/L (ref 22–32)
Calcium: 9 mg/dL (ref 8.9–10.3)
Chloride: 101 mmol/L (ref 98–111)
Creatinine, Ser: 0.76 mg/dL (ref 0.44–1.00)
GFR, Estimated: >60 mL/min (ref >60)
Glucose, Bld: 103 mg/dL — ABNORMAL HIGH (ref 70–99)
Potassium: 3.9 mmol/L (ref 3.5–5.1)
Sodium: 135 mmol/L (ref 135–145)
Total Bilirubin: 6.9 mg/dL — ABNORMAL HIGH (ref 0.3–1.2)
Total Protein: 7.3 g/dL (ref 6.5–8.1)

## 2020-03-18 LAB — GLUCOSE, CAPILLARY
Glucose-Capillary: 122 mg/dL — ABNORMAL HIGH (ref 70–99)
Glucose-Capillary: 137 mg/dL — ABNORMAL HIGH (ref 70–99)
Glucose-Capillary: 92 mg/dL (ref 70–99)
Glucose-Capillary: 97 mg/dL (ref 70–99)
Glucose-Capillary: 98 mg/dL (ref 70–99)

## 2020-03-18 LAB — URINE CULTURE

## 2020-03-18 LAB — PROTIME-INR
INR: 1.2 (ref 0.8–1.2)
Prothrombin Time: 14.3 seconds (ref 11.4–15.2)

## 2020-03-18 LAB — MAGNESIUM: Magnesium: 2 mg/dL (ref 1.7–2.4)

## 2020-03-18 LAB — HEPATITIS B CORE ANTIBODY, TOTAL: Hep B Core Total Ab: NONREACTIVE

## 2020-03-18 MED ORDER — INSULIN ASPART 100 UNIT/ML ~~LOC~~ SOLN: 0.0000 [IU] | Freq: Three times a day (TID) | SUBCUTANEOUS | Status: DC

## 2020-03-18 NOTE — Progress Notes (Signed)
PROGRESS NOTE    Norma [name]  UMP:[id number] DOB: 1960/09/01 DOA: 03/16/2020 PCP: [name], NP    Chief Complaint  Patient presents with  . Abdominal Pain    Brief Narrative:  HPI per [name] is a 59 y.o. female with medical history significant of type 2 diabetes not on meds, hypertension, depression, anxiety, GERD, chronic pain presenting with complaints of abdominal pain.  Patient stated he normally never gets abdominal pain after eating.  However, Friday night after eating dinner she experienced cramping generalized abdominal pain.  Today she is feeling pain mostly in her right upper quadrant.  She describes it as a cramping sensation that is constant.  She has felt nauseous but has not vomited.  Denies history of any gallbladder problems.  Denies fevers or chills.  She has been vaccinated against Covid.  Denies shortness of breath, cough, and chest pain.  ED Course: Blood pressure significantly elevated with systolic above 400, remainder of vital signs stable.  WBC 7.8, hemoglobin 13.4, hematocrit 42.0, platelet 211K.  Sodium 138, potassium 3.6, chloride 104, bicarb 25, BUN 12, creatinine 0.6, glucose 111.  Lipase and LFTs normal.  UA with negative nitrite, trace amount of leukocytes, 6-10 WBCs, and no bacteria.  Screening SARS-CoV-2 PCR test and influenza panel pending.  CT abdomen pelvis showing findings concerning for acalculous acute cholecystitis.  Stable left adnexal cyst, likely ovarian in origin.  Further correlation with pelvic ultrasound recommended.  Right upper quadrant abdominal ultrasound showing cholelithiasis and gallbladder sludge with no findings to suggest acute cholecystitis.  CBD diameter at the upper limit of normal/borderline enlarged at 8 mm with choledocholithiasis more distally not excluded.  Also showing evidence of hepatic steatosis.  Patient was given fentanyl, Dilaudid, morphine, Zofran, Zosyn, and ceftriaxone.  ED provider  discussed the case with on-call physician for [facility] who recommended obtaining a HIDA scan and if there is concern for cholecystitis/biliary colic then consult general surgery in the morning.  ERCP not recommended as LFTs are normal.  GI will see the patient in the morning.  Assessment & Plan:   Principal Problem:   Cholelithiasis Active Problems:   Depression   Abdominal pain   Hypertensive urgency   Type 2 diabetes mellitus (HCC)   RUQ pain   Biliary colic symptom   Elevated liver enzymes   Abnormal findings on diagnostic imaging of liver and biliary tract  1 right upper quadrant abdominal pain/cholelithiasis/possible acute cholecystitis Patient presented with severe exquisite right upper quadrant abdominal pain associated with food.  On admission lipase levels within normal limits.  LFTs were within normal limits on admission however rising.  AST 322 from 350, ALT of 227 from 146.  Bilirubin elevated at 6.9 today from 3.0 yesterday.  CT abdomen and pelvis done on admission with findings consistent with a calculus acute cholecystitis, stable left adnexal cyst likely ovarian in origin.  Right upper quadrant abdominal ultrasound done with cholelithiasis and gallbladder sludge with no findings to suggest acute cholecystitis.  Common bile duct measures upper limits of normal 8 mm with choledocholithiasis more distally not excluded, hepatic steatosis.  HIDA scan obtained today with imaging findings consistent with either common bile duct obstruction or hepatocellular dysfunction secondary to hepatitis.  Acute hepatitis panel with hepatitis C antibody reactive.  Check a hepatitis C RNA.  MRCP which was done positive for cholelithiasis with some subtle changes which could be indicative of early acute cholecystitis, including mild gallbladder wall thickening, trace amount of  pericholecystic fluid, mildly dilated common bile duct at 10 mm in the porta hepatis, there is no choledocholithiasis, no  intrahepatic biliary ductal dilatation to suggest frank biliary tract obstruction, hepatic steatosis.  Patient initially started on a clear liquid diet this morning and has been advanced to a full liquid diet.  Continue IV Zosyn.  GI following and planning for ERCP on 03/20/2020.  General surgery following and patient likely will require cholecystectomy post ERCP.  Appreciate general surgery and GIs input and recommendations.  2 + HCV antibody Check HCV RNA.  GI following.  3.  Diet-controlled diabetes mellitus type 2 versus prediabetes Not on any medications prior to admission.  Hemoglobin A1c 6.1.  CBG of 98.  Change CBGs to before meals and at bedtime as patient currently on a diet.  Sliding scale insulin.  Follow.   4.  Hypertension/hypertensive urgency Controlled on home regimen of HCTZ, Cozaar.  Hydralazine as needed.   5.  Abnormal urinalysis Urine cultures with multiple species present.  Asymptomatic.  Follow.   6.  Left adnexal cyst Noted on CT abdomen and pelvis.  It was noted per admitting physician that patient was seen by OB/GYN in September 2020 for an endometrial mass, biopsies revealed atrophic endometrium with benign endometrial polyp, no atypia or malignancy.  Outpatient follow-up with OB/GYN.  7.  Depression/anxiety Continue home regimen Wellbutrin, trazodone.  Outpatient follow-up.    DVT prophylaxis: SCDs Code Status: Full Family Communication: Updated patient.  No family at bedside. Disposition:   Status is: Observation    Dispo: The patient is from: Home              Anticipated d/c is to: Home              Anticipated d/c date is: 3 to 4 days.              Patient currently with right upper quadrant pain, concerning for possible acute cholecystitis, CBD obstruction.  Work-up underway.  On IV pain medications.  Not stable for discharge.        Consultants:   Gastroenterology: Dr. Hilarie Fredrickson 03/17/2020  General surgery: Dr. Hassell Done 03/17/2020  Procedures:     CT abdomen and pelvis 03/16/2020  Right upper quadrant ultrasound 03/16/2020  HIDA scan 03/17/2020  MRCP 03/17/2020  Antimicrobials:   IV Zosyn 03/17/2020>>>>   Subjective: Patient sitting up in bed about to order a clear liquid tray.  Denies any chest pain or shortness of breath.  States right upper quadrant abdominal pain has improved since yesterday.  No nausea or emesis.  Feeling better.   Objective: Vitals:   03/17/20 1143 03/17/20 1547 03/17/20 2051 03/18/20 0500  BP: (!) 143/89 137/82 140/86 139/78  Pulse: 73 78 89 84  Resp: 18  18 18   Temp: 97.6 F (36.4 C) 97.9 F (36.6 C) 97.6 F (36.4 C) 97.7 F (36.5 C)  TempSrc: Oral  Oral Oral  SpO2: 98% 95% 99% 100%  Weight:      Height:        Intake/Output Summary (Last 24 hours) at 03/18/2020 0945 Last data filed at 03/18/2020 0200 Gross per 24 hour  Intake 505.8 ml  Output --  Net 505.8 ml   Filed Weights   03/17/20 0544  Weight: 114 kg    Examination:  General exam: NAD Respiratory system: Lungs clear to auscultation bilaterally.  No wheezes, no crackles, no rhonchi.  Normal respiratory effort.  Speaking in full sentences.   Cardiovascular system: Regular rate  rhythm no murmurs rubs or gallops.  No JVD.  No lower extremity edema.  Gastrointestinal system: Abdomen is soft, nondistended, decreased tenderness to palpation right upper quadrant.  Positive bowel sounds.  No rebound.  No guarding.  Central nervous system: Alert and oriented. No focal neurological deficits. Extremities: Symmetric 5 x 5 power. Skin: No rashes, lesions or ulcers Psychiatry: Judgement and insight appear normal. Mood & affect appropriate.     Data Reviewed: I have personally reviewed following labs and imaging studies  CBC: Recent Labs  Lab 03/16/20 1648 03/17/20 0848 03/18/20 0435  WBC 7.8 9.1 6.3  NEUTROABS  --  6.8  --   HGB 13.4 12.4 12.5  HCT 42.0 39.6 39.6  MCV 87.3 89.0 87.8  PLT 211 197 181    Basic  Metabolic Panel: Recent Labs  Lab 03/16/20 1648 03/17/20 0848 03/18/20 0435  NA 138 139 135  K 3.6 4.0 3.9  CL 104 103 101  CO2 25 25 24   GLUCOSE 111* 134* 103*  BUN 12 12 10   CREATININE 0.60 0.96 0.76  CALCIUM 9.4 8.6* 9.0  MG  --  1.9 2.0    GFR: Estimated Creatinine Clearance: 97.8 mL/min (by C-G formula based on SCr of 0.76 mg/dL).  Liver Function Tests: Recent Labs  Lab 03/16/20 1648 03/17/20 0848 03/18/20 0435  AST 31 350* 322*  ALT 22 146* 227*  ALKPHOS 63 109 112  BILITOT 0.8 3.0* 6.9*  PROT 8.0 7.5 7.3  ALBUMIN 4.2 3.5 3.6    CBG: Recent Labs  Lab 03/17/20 1151 03/17/20 1715 03/17/20 2149 03/18/20 0004 03/18/20 0509  GLUCAP 116* 92 118* 92 98     Recent Results (from the past 240 hour(s))  Resp Panel by RT-PCR (Flu A&B, Covid) Nasopharyngeal Swab     Status: None   Collection Time: 03/16/20  8:21 PM   Specimen: Nasopharyngeal Swab; Nasopharyngeal(NP) swabs in vial transport medium  Result Value Ref Range Status   SARS Coronavirus 2 by RT PCR NEGATIVE NEGATIVE Final    Comment: (NOTE) SARS-CoV-2 target nucleic acids are NOT DETECTED.  The SARS-CoV-2 RNA is generally detectable in upper respiratory specimens during the acute phase of infection. The lowest concentration of SARS-CoV-2 viral copies this assay can detect is 138 copies/mL. A negative result does not preclude SARS-Cov-2 infection and should not be used as the sole basis for treatment or other patient management decisions. A negative result may occur with  improper specimen collection/handling, submission of specimen other than nasopharyngeal swab, presence of viral mutation(s) within the areas targeted by this assay, and inadequate number of viral copies(<138 copies/mL). A negative result must be combined with clinical observations, patient history, and epidemiological information. The expected result is Negative.  Fact Sheet for Patients:   EntrepreneurPulse.com.au  Fact Sheet for Healthcare Providers:  IncredibleEmployment.be  This test is no t yet approved or cleared by the [place] FDA and  has been authorized for detection and/or diagnosis of SARS-CoV-2 by FDA under an Emergency Use Authorization (EUA). This EUA will remain  in effect (meaning this test can be used) for the duration of the COVID-19 declaration under Section 564(b)(1) of the Act, 21 U.S.C.section 360bbb-3(b)(1), unless the authorization is terminated  or revoked sooner.       Influenza A by PCR NEGATIVE NEGATIVE Final   Influenza B by PCR NEGATIVE NEGATIVE Final    Comment: (NOTE) The Xpert Xpress SARS-CoV-2/FLU/RSV plus assay is intended as an aid in the diagnosis of influenza from Nasopharyngeal  swab specimens and should not be used as a sole basis for treatment. Nasal washings and aspirates are unacceptable for Xpert Xpress SARS-CoV-2/FLU/RSV testing.  Fact Sheet for Patients: EntrepreneurPulse.com.au  Fact Sheet for Healthcare Providers: IncredibleEmployment.be  This test is not yet approved or cleared by the [place] FDA and has been authorized for detection and/or diagnosis of SARS-CoV-2 by FDA under an Emergency Use Authorization (EUA). This EUA will remain in effect (meaning this test can be used) for the duration of the COVID-19 declaration under Section 564(b)(1) of the Act, 21 U.S.C. section 360bbb-3(b)(1), unless the authorization is terminated or revoked.  Performed at [facility], [place] [address] [place], [place] [address]   Culture, Urine     Status: Abnormal   Collection Time: 03/16/20 10:08 PM   Specimen: Urine, Clean Catch  Result Value Ref Range Status   Specimen Description   Final    URINE, CLEAN CATCH Performed at [facility], [place] [address] [place], [place] [address]    Special Requests    Final    NONE Performed at [facility], [place] [address] [place], [place] [address]    Culture MULTIPLE SPECIES PRESENT, SUGGEST RECOLLECTION (A)  Final   Report Status 03/18/2020 FINAL  Final         Radiology Studies: NM Hepatobiliary Liver Func  Result Date: 03/17/2020 CLINICAL DATA:  Concern for acute cholecystitis. EXAM: NUCLEAR MEDICINE HEPATOBILIARY IMAGING TECHNIQUE: Sequential images of the abdomen were obtained out to 60 minutes following intravenous administration of radiopharmaceutical. RADIOPHARMACEUTICALS:  5.5 mCi Tc-73m  Choletec IV COMPARISON:  Ultrasound 03/16/2020 FINDINGS: Prompt uptake by the liver is seen. Imaging was performed for 90 minutes with no biliary excretion identified. No small bowel or gallbladder activity identified. IMPRESSION: 1. There is diffuse tracer uptake by the liver with absent biliary excretion after 190 minutes. Imaging findings are consistent with either common bile duct obstruction or hepatocellular dysfunction secondary to hepatitis. Electronically Signed   By: Kerby Moors M.D.   On: 03/17/2020 14:51   CT ABDOMEN PELVIS W CONTRAST  Result Date: 03/16/2020 CLINICAL DATA:  Right upper quadrant pain. EXAM: CT ABDOMEN AND PELVIS WITH CONTRAST TECHNIQUE: Multidetector CT imaging of the abdomen and pelvis was performed using the standard protocol following bolus administration of intravenous contrast. CONTRAST:  130mL OMNIPAQUE IOHEXOL 300 MG/ML  SOLN COMPARISON:  March 26, 2017 FINDINGS: Lower chest: Very mild atelectasis is seen within the bilateral lung bases. Hepatobiliary: No focal liver abnormality is seen. The gallbladder is moderately distended. No gallstones are identified. Mild pericholecystic inflammatory fat stranding is seen. Pancreas: Unremarkable. No pancreatic ductal dilatation or surrounding inflammatory changes. Spleen: Normal in size without focal abnormality. Adrenals/Urinary Tract: Adrenal glands are  unremarkable. Kidneys are normal, without renal calculi, focal lesion, or hydronephrosis. Bladder is unremarkable. Stomach/Bowel: Stomach is within normal limits. The appendix is not clearly identified. No evidence of bowel wall thickening, distention, or inflammatory changes. Vascular/Lymphatic: No significant vascular findings are present. No enlarged abdominal or pelvic lymph nodes. Reproductive: The uterus is normal in size and appearance. A stable 4.3 cm x 5.2 cm lobulated area of low attenuation is seen within the posterior aspect of the left adnexa. Other: No abdominal wall hernia or abnormality. No abdominopelvic ascites. Musculoskeletal: No acute or significant osseous findings. IMPRESSION: 1. Findings consistent with acalculous acute cholecystitis. Correlation with right upper quadrant ultrasound is recommended. 2. Stable left adnexal cyst, likely ovarian in origin. Further correlation with pelvic ultrasound is  recommended. Electronically Signed   By: Virgina Norfolk M.D.   On: 03/16/2020 18:31   MR ABDOMEN MRCP WO CONTRAST  Result Date: 03/18/2020 CLINICAL DATA:  59 year old female with history of right-sided abdominal pain for the past 3 days. EXAM: MRI ABDOMEN WITHOUT CONTRAST  (INCLUDING MRCP) TECHNIQUE: Multiplanar multisequence MR imaging of the abdomen was performed. Heavily T2-weighted images of the biliary and pancreatic ducts were obtained, and three-dimensional MRCP images were rendered by post processing. COMPARISON:  No prior abdominal MRI. CT the abdomen and pelvis 03/16/2020. FINDINGS: Comment: Today's study is limited for detection and characterization of visceral and/or vascular lesions by lack of IV gadolinium. Lower chest: Trace right pleural effusion lying dependently. Hepatobiliary: Diffuse loss of signal intensity throughout the hepatic parenchyma on out of phase dual echo images, indicative of a background of hepatic steatosis. No discrete cystic or solid hepatic lesions are  confidently identified on today's noncontrast examination. No intrahepatic biliary ductal dilatation. Common bile duct is mildly dilated measuring 1 cm in the porta hepatis. No filling defects are noted within the common bile duct to suggest choledocholithiasis. There are multiple filling defects within the gallbladder dependently, compatible with small gallstones. Gallbladder is moderately distended. Portions of the gallbladder wall appear mildly thickened and edematous measuring up to 5 mm. Trace amount of pericholecystic fluid. Pancreas: No definite pancreatic mass or peripancreatic fluid collections or inflammatory changes are noted on today's noncontrast examination. Spleen:  Unremarkable. Adrenals/Urinary Tract: Unenhanced appearance of the kidneys and bilateral adrenal glands is normal. No hydroureteronephrosis in the visualized portions of the abdomen. Stomach/Bowel: Visualized portions are unremarkable. Vascular/Lymphatic: No aneurysm identified in the visualized abdominal vasculature on today's noncontrast examination. No lymphadenopathy noted in the visualized portions of the abdomen. Other:  No significant volume of ascites. Musculoskeletal: No aggressive appearing osseous lesions are noted in the visualized portions of the skeleton. IMPRESSION: 1. Study is positive for cholelithiasis with some subtle changes which could be indicative of early acute cholecystitis, including mild gallbladder wall thickening and trace amount of pericholecystic fluid. 2. Although the common bile duct is mildly dilated measuring 10 mm in the porta hepatis, there is no choledocholithiasis. Additionally, there is no intrahepatic biliary ductal dilatation to suggest frank biliary tract obstruction. 3. Hepatic steatosis. 4. Trace right pleural effusion lying dependently. Electronically Signed   By: Vinnie Langton M.D.   On: 03/18/2020 05:35   MR 3D Recon At Scanner  Result Date: 03/18/2020 CLINICAL DATA:  59 year old  female with history of right-sided abdominal pain for the past 3 days. EXAM: MRI ABDOMEN WITHOUT CONTRAST  (INCLUDING MRCP) TECHNIQUE: Multiplanar multisequence MR imaging of the abdomen was performed. Heavily T2-weighted images of the biliary and pancreatic ducts were obtained, and three-dimensional MRCP images were rendered by post processing. COMPARISON:  No prior abdominal MRI. CT the abdomen and pelvis 03/16/2020. FINDINGS: Comment: Today's study is limited for detection and characterization of visceral and/or vascular lesions by lack of IV gadolinium. Lower chest: Trace right pleural effusion lying dependently. Hepatobiliary: Diffuse loss of signal intensity throughout the hepatic parenchyma on out of phase dual echo images, indicative of a background of hepatic steatosis. No discrete cystic or solid hepatic lesions are confidently identified on today's noncontrast examination. No intrahepatic biliary ductal dilatation. Common bile duct is mildly dilated measuring 1 cm in the porta hepatis. No filling defects are noted within the common bile duct to suggest choledocholithiasis. There are multiple filling defects within the gallbladder dependently, compatible with small gallstones. Gallbladder is moderately distended.  Portions of the gallbladder wall appear mildly thickened and edematous measuring up to 5 mm. Trace amount of pericholecystic fluid. Pancreas: No definite pancreatic mass or peripancreatic fluid collections or inflammatory changes are noted on today's noncontrast examination. Spleen:  Unremarkable. Adrenals/Urinary Tract: Unenhanced appearance of the kidneys and bilateral adrenal glands is normal. No hydroureteronephrosis in the visualized portions of the abdomen. Stomach/Bowel: Visualized portions are unremarkable. Vascular/Lymphatic: No aneurysm identified in the visualized abdominal vasculature on today's noncontrast examination. No lymphadenopathy noted in the visualized portions of the  abdomen. Other:  No significant volume of ascites. Musculoskeletal: No aggressive appearing osseous lesions are noted in the visualized portions of the skeleton. IMPRESSION: 1. Study is positive for cholelithiasis with some subtle changes which could be indicative of early acute cholecystitis, including mild gallbladder wall thickening and trace amount of pericholecystic fluid. 2. Although the common bile duct is mildly dilated measuring 10 mm in the porta hepatis, there is no choledocholithiasis. Additionally, there is no intrahepatic biliary ductal dilatation to suggest frank biliary tract obstruction. 3. Hepatic steatosis. 4. Trace right pleural effusion lying dependently. Electronically Signed   By: Vinnie Langton M.D.   On: 03/18/2020 05:35   US Abdomen Limited RUQ (LIVER/GB)  Result Date: 03/16/2020 CLINICAL DATA:  Biliary colic symptoms, hypertension, diabetes EXAM: ULTRASOUND ABDOMEN LIMITED RIGHT UPPER QUADRANT COMPARISON:  CT abdomen pelvis 03/27/2017. FINDINGS: Gallbladder: Calcified stone and sludge noted within the gallbladder lumen. No pericholecystic fluid or wall thickening visualized. No sonographic Murphy sign noted by sonographer. Common bile duct: Diameter: 8 mm-upper limits of normal/borderline enlarged Liver: No focal lesion identified. Increased in parenchymal echogenicity. Portal vein is patent on color Doppler imaging with normal direction of blood flow towards the liver. Other: None. IMPRESSION: 1. Cholelithiasis and gallbladder sludge with no findings to suggest acute cholecystitis. 2. The common bile duct measures at the upper limits of normal/borderline enlarged (8 mm) with choledocholithiasis more distally not excluded. 3. Hepatic steatosis. Please note limited evaluation for focal hepatic masses in a patient with hepatic steatosis due to decreased penetration of the acoustic ultrasound waves. Electronically Signed   By: Iven Finn M.D.   On: 03/16/2020 19:46         Scheduled Meds: . buPROPion  300 mg Oral Daily  . fluticasone  1 spray Each Nare Daily  . gabapentin  100 mg Oral QHS  . hydrochlorothiazide  25 mg Oral Daily  . insulin aspart  0-9 Units Subcutaneous Q6H  . ketorolac  30 mg Intravenous Once  . loratadine  10 mg Oral Daily  . losartan  50 mg Oral Daily  . metoprolol tartrate  25 mg Oral BID  . multivitamin-iron-minerals-folic acid  1 tablet Oral Daily  . pantoprazole  80 mg Oral Daily  . potassium chloride  20 mEq Oral Daily  . traZODone  100 mg Oral QHS  . Vitamin D (Ergocalciferol)  50,000 Units Oral Q7 days   Continuous Infusions: . sodium chloride 100 mL/hr at 03/18/20 0200  . piperacillin-tazobactam (ZOSYN)  IV 3.375 g (03/18/20 0316)     LOS: 2 days    Time spent: 35 minutes    Irine Seal, MD [facility]   To contact the attending provider between 7A-7P or the covering provider during after hours 7P-7A, please log into the web site [email or web address] and access using universal [place] password for that web site. If you do not have the password, please call the hospital operator.  03/18/2020, 9:45 AM

## 2020-03-18 NOTE — Progress Notes (Addendum)
Progress Note  Chief Complaint:    Abdominal pain, elevated LFT     ASSESSMENT / PLAN:    # RUQ pain / elevated LFT / cholelithiasis, probable cholecystitis.  MRCP remarkable for dilated CBD at 10 mm, no choledocholithiasis, subtle changes suspicious for early acute cholecystitis. Her bilirubin is up from 3 to nearly 7 today despite no obvious CBD stone.  WBC normal, on Zosyn.  --Plan is for EUS / ERCP. So far there is no availability until Thursday at 1pm. She feels better, wants diet. Will give full liquid diet today. Clears in am just in case an Endo slot becomes available. I reviewed risks and benefit of ERCP including but not limited to infection, perforation, bleeding, and pancreatitis. Patient agrees to proceed.  --Surgery is following. Will need eventual cholecystectomy  Addendum surgery notified me that her HCV ab is positive. Will order viral load. .   SUBJECTIVE:   Pain is better today. She is hungry, wants a diet    OBJECTIVE:    Scheduled inpatient medications:  . buPROPion  300 mg Oral Daily  . fluticasone  1 spray Each Nare Daily  . gabapentin  100 mg Oral QHS  . hydrochlorothiazide  25 mg Oral Daily  . insulin aspart  0-9 Units Subcutaneous Q6H  . ketorolac  30 mg Intravenous Once  . loratadine  10 mg Oral Daily  . losartan  50 mg Oral Daily  . metoprolol tartrate  25 mg Oral BID  . multivitamin-iron-minerals-folic acid  1 tablet Oral Daily  . pantoprazole  80 mg Oral Daily  . potassium chloride  20 mEq Oral Daily  . traZODone  100 mg Oral QHS  . Vitamin D (Ergocalciferol)  50,000 Units Oral Q7 days   Continuous inpatient infusions:  . sodium chloride 100 mL/hr at 03/18/20 0950  . piperacillin-tazobactam (ZOSYN)  IV 3.375 g (03/18/20 0316)   PRN inpatient medications: acetaminophen **OR** acetaminophen, cyclobenzaprine, gadobutrol, hydrALAZINE, morphine injection, ondansetron (ZOFRAN) IV  Vital signs in last 24 hours: Temp:  [97.6 F (36.4 C)-97.9  F (36.6 C)] 97.7 F (36.5 C) (11/30 0500) Pulse Rate:  [73-89] 84 (11/30 0500) Resp:  [18] 18 (11/30 0500) BP: (137-143)/(78-89) 139/78 (11/30 0500) SpO2:  [95 %-100 %] 100 % (11/30 0500) Last BM Date: 03/17/20  Intake/Output Summary (Last 24 hours) at 03/18/2020 1003 Last data filed at 03/18/2020 0200 Gross per 24 hour  Intake 505.8 ml  Output --  Net 505.8 ml     Physical Exam:  . General: Alert female in NAD . Heart:  Regular rate and rhythm. No lower extremity edema . Pulmonary: Normal respiratory effort . Abdomen: Soft, nondistended, nontender. Normal bowel sounds.  . Neurologic: Alert and oriented . Psych: Pleasant. Cooperative.   Filed Weights   03/17/20 0544  Weight: 114 kg    Intake/Output from previous day: 11/29 0701 - 11/30 0700 In: 505.8 [P.O.:100; I.V.:400; IV Piggyback:5.8] Out: -  Intake/Output this shift: No intake/output data recorded.    Lab Results: Recent Labs    03/16/20 1648 03/17/20 0848 03/18/20 0435  WBC 7.8 9.1 6.3  HGB 13.4 12.4 12.5  HCT 42.0 39.6 39.6  PLT 211 197 181   BMET Recent Labs    03/16/20 1648 03/17/20 0848 03/18/20 0435  NA 138 139 135  K 3.6 4.0 3.9  CL 104 103 101  CO2 25 25 24   GLUCOSE 111* 134* 103*  BUN 12 12 10   CREATININE 0.60 0.96 0.76  CALCIUM  9.4 8.6* 9.0   LFT Recent Labs    03/18/20 0435  PROT 7.3  ALBUMIN 3.6  AST 322*  ALT 227*  ALKPHOS 112  BILITOT 6.9*   PT/INR Recent Labs    03/18/20 0435  LABPROT 14.3  INR 1.2   Hepatitis Panel Recent Labs    03/18/20 0435  HEPBSAG NON REACTIVE  HCVAB Reactive*  HEPAIGM NON REACTIVE  HEPBIGM NON REACTIVE    NM Hepatobiliary Liver Func  Result Date: 03/17/2020 CLINICAL DATA:  Concern for acute cholecystitis. EXAM: NUCLEAR MEDICINE HEPATOBILIARY IMAGING TECHNIQUE: Sequential images of the abdomen were obtained out to 60 minutes following intravenous administration of radiopharmaceutical. RADIOPHARMACEUTICALS:  5.5 mCi Tc-81m   Choletec IV COMPARISON:  Ultrasound 03/16/2020 FINDINGS: Prompt uptake by the liver is seen. Imaging was performed for 90 minutes with no biliary excretion identified. No small bowel or gallbladder activity identified. IMPRESSION: 1. There is diffuse tracer uptake by the liver with absent biliary excretion after 190 minutes. Imaging findings are consistent with either common bile duct obstruction or hepatocellular dysfunction secondary to hepatitis. Electronically Signed   By: Kerby Moors M.D.   On: 03/17/2020 14:51   CT ABDOMEN PELVIS W CONTRAST  Result Date: 03/16/2020 CLINICAL DATA:  Right upper quadrant pain. EXAM: CT ABDOMEN AND PELVIS WITH CONTRAST TECHNIQUE: Multidetector CT imaging of the abdomen and pelvis was performed using the standard protocol following bolus administration of intravenous contrast. CONTRAST:  173mL OMNIPAQUE IOHEXOL 300 MG/ML  SOLN COMPARISON:  March 26, 2017 FINDINGS: Lower chest: Very mild atelectasis is seen within the bilateral lung bases. Hepatobiliary: No focal liver abnormality is seen. The gallbladder is moderately distended. No gallstones are identified. Mild pericholecystic inflammatory fat stranding is seen. Pancreas: Unremarkable. No pancreatic ductal dilatation or surrounding inflammatory changes. Spleen: Normal in size without focal abnormality. Adrenals/Urinary Tract: Adrenal glands are unremarkable. Kidneys are normal, without renal calculi, focal lesion, or hydronephrosis. Bladder is unremarkable. Stomach/Bowel: Stomach is within normal limits. The appendix is not clearly identified. No evidence of bowel wall thickening, distention, or inflammatory changes. Vascular/Lymphatic: No significant vascular findings are present. No enlarged abdominal or pelvic lymph nodes. Reproductive: The uterus is normal in size and appearance. A stable 4.3 cm x 5.2 cm lobulated area of low attenuation is seen within the posterior aspect of the left adnexa. Other: No abdominal  wall hernia or abnormality. No abdominopelvic ascites. Musculoskeletal: No acute or significant osseous findings. IMPRESSION: 1. Findings consistent with acalculous acute cholecystitis. Correlation with right upper quadrant ultrasound is recommended. 2. Stable left adnexal cyst, likely ovarian in origin. Further correlation with pelvic ultrasound is recommended. Electronically Signed   By: Virgina Norfolk M.D.   On: 03/16/2020 18:31   MR ABDOMEN MRCP WO CONTRAST  Result Date: 03/18/2020 CLINICAL DATA:  59 year old female with history of right-sided abdominal pain for the past 3 days. EXAM: MRI ABDOMEN WITHOUT CONTRAST  (INCLUDING MRCP) TECHNIQUE: Multiplanar multisequence MR imaging of the abdomen was performed. Heavily T2-weighted images of the biliary and pancreatic ducts were obtained, and three-dimensional MRCP images were rendered by post processing. COMPARISON:  No prior abdominal MRI. CT the abdomen and pelvis 03/16/2020. FINDINGS: Comment: Today's study is limited for detection and characterization of visceral and/or vascular lesions by lack of IV gadolinium. Lower chest: Trace right pleural effusion lying dependently. Hepatobiliary: Diffuse loss of signal intensity throughout the hepatic parenchyma on out of phase dual echo images, indicative of a background of hepatic steatosis. No discrete cystic or solid hepatic lesions are confidently  identified on today's noncontrast examination. No intrahepatic biliary ductal dilatation. Common bile duct is mildly dilated measuring 1 cm in the porta hepatis. No filling defects are noted within the common bile duct to suggest choledocholithiasis. There are multiple filling defects within the gallbladder dependently, compatible with small gallstones. Gallbladder is moderately distended. Portions of the gallbladder wall appear mildly thickened and edematous measuring up to 5 mm. Trace amount of pericholecystic fluid. Pancreas: No definite pancreatic mass or  peripancreatic fluid collections or inflammatory changes are noted on today's noncontrast examination. Spleen:  Unremarkable. Adrenals/Urinary Tract: Unenhanced appearance of the kidneys and bilateral adrenal glands is normal. No hydroureteronephrosis in the visualized portions of the abdomen. Stomach/Bowel: Visualized portions are unremarkable. Vascular/Lymphatic: No aneurysm identified in the visualized abdominal vasculature on today's noncontrast examination. No lymphadenopathy noted in the visualized portions of the abdomen. Other:  No significant volume of ascites. Musculoskeletal: No aggressive appearing osseous lesions are noted in the visualized portions of the skeleton. IMPRESSION: 1. Study is positive for cholelithiasis with some subtle changes which could be indicative of early acute cholecystitis, including mild gallbladder wall thickening and trace amount of pericholecystic fluid. 2. Although the common bile duct is mildly dilated measuring 10 mm in the porta hepatis, there is no choledocholithiasis. Additionally, there is no intrahepatic biliary ductal dilatation to suggest frank biliary tract obstruction. 3. Hepatic steatosis. 4. Trace right pleural effusion lying dependently. Electronically Signed   By: Vinnie Langton M.D.   On: 03/18/2020 05:35   MR 3D Recon At Scanner  Result Date: 03/18/2020 CLINICAL DATA:  59 year old female with history of right-sided abdominal pain for the past 3 days. EXAM: MRI ABDOMEN WITHOUT CONTRAST  (INCLUDING MRCP) TECHNIQUE: Multiplanar multisequence MR imaging of the abdomen was performed. Heavily T2-weighted images of the biliary and pancreatic ducts were obtained, and three-dimensional MRCP images were rendered by post processing. COMPARISON:  No prior abdominal MRI. CT the abdomen and pelvis 03/16/2020. FINDINGS: Comment: Today's study is limited for detection and characterization of visceral and/or vascular lesions by lack of IV gadolinium. Lower chest: Trace  right pleural effusion lying dependently. Hepatobiliary: Diffuse loss of signal intensity throughout the hepatic parenchyma on out of phase dual echo images, indicative of a background of hepatic steatosis. No discrete cystic or solid hepatic lesions are confidently identified on today's noncontrast examination. No intrahepatic biliary ductal dilatation. Common bile duct is mildly dilated measuring 1 cm in the porta hepatis. No filling defects are noted within the common bile duct to suggest choledocholithiasis. There are multiple filling defects within the gallbladder dependently, compatible with small gallstones. Gallbladder is moderately distended. Portions of the gallbladder wall appear mildly thickened and edematous measuring up to 5 mm. Trace amount of pericholecystic fluid. Pancreas: No definite pancreatic mass or peripancreatic fluid collections or inflammatory changes are noted on today's noncontrast examination. Spleen:  Unremarkable. Adrenals/Urinary Tract: Unenhanced appearance of the kidneys and bilateral adrenal glands is normal. No hydroureteronephrosis in the visualized portions of the abdomen. Stomach/Bowel: Visualized portions are unremarkable. Vascular/Lymphatic: No aneurysm identified in the visualized abdominal vasculature on today's noncontrast examination. No lymphadenopathy noted in the visualized portions of the abdomen. Other:  No significant volume of ascites. Musculoskeletal: No aggressive appearing osseous lesions are noted in the visualized portions of the skeleton. IMPRESSION: 1. Study is positive for cholelithiasis with some subtle changes which could be indicative of early acute cholecystitis, including mild gallbladder wall thickening and trace amount of pericholecystic fluid. 2. Although the common bile duct is mildly dilated measuring  10 mm in the porta hepatis, there is no choledocholithiasis. Additionally, there is no intrahepatic biliary ductal dilatation to suggest frank  biliary tract obstruction. 3. Hepatic steatosis. 4. Trace right pleural effusion lying dependently. Electronically Signed   By: Vinnie Langton M.D.   On: 03/18/2020 05:35   US Abdomen Limited RUQ (LIVER/GB)  Result Date: 03/16/2020 CLINICAL DATA:  Biliary colic symptoms, hypertension, diabetes EXAM: ULTRASOUND ABDOMEN LIMITED RIGHT UPPER QUADRANT COMPARISON:  CT abdomen pelvis 03/27/2017. FINDINGS: Gallbladder: Calcified stone and sludge noted within the gallbladder lumen. No pericholecystic fluid or wall thickening visualized. No sonographic Murphy sign noted by sonographer. Common bile duct: Diameter: 8 mm-upper limits of normal/borderline enlarged Liver: No focal lesion identified. Increased in parenchymal echogenicity. Portal vein is patent on color Doppler imaging with normal direction of blood flow towards the liver. Other: None. IMPRESSION: 1. Cholelithiasis and gallbladder sludge with no findings to suggest acute cholecystitis. 2. The common bile duct measures at the upper limits of normal/borderline enlarged (8 mm) with choledocholithiasis more distally not excluded. 3. Hepatic steatosis. Please note limited evaluation for focal hepatic masses in a patient with hepatic steatosis due to decreased penetration of the acoustic ultrasound waves. Electronically Signed   By: Iven Finn M.D.   On: 03/16/2020 19:46      Principal Problem:   Cholelithiasis Active Problems:   Depression   Abdominal pain   Hypertensive urgency   Type 2 diabetes mellitus (HCC)   RUQ pain   Biliary colic symptom   Elevated liver enzymes   Abnormal findings on diagnostic imaging of liver and biliary tract     LOS: 2 days   Tye Savoy ,NP 03/18/2020, 10:03 AM

## 2020-03-18 NOTE — Plan of Care (Signed)

## 2020-03-18 NOTE — Plan of Care (Signed)
  Problem: Education: Goal: Knowledge of General Education information will improve Description: Including pain rating scale, medication(s)/side effects and non-pharmacologic comfort measures Outcome: Progressing   Problem: Health Behavior/Discharge Planning: Goal: Ability to manage health-related needs will improve Outcome: Progressing   Problem: Clinical Measurements: Goal: Will remain free from infection Outcome: Progressing   Problem: Activity: Goal: Risk for activity intolerance will decrease Outcome: Progressing   Problem: Nutrition: Goal: Adequate nutrition will be maintained Outcome: Progressing   Problem: Coping: Goal: Level of anxiety will decrease Outcome: Progressing   Problem: Pain Managment: Goal: General experience of comfort will improve Outcome: Progressing   

## 2020-03-18 NOTE — Progress Notes (Addendum)
CC: Abdominal pain  Subjective: Patient actually feels better this a.m.  Pain is much better in the right upper quadrant.  She did note that her urine is darker for the last 2 or 3 days.  No other significant complaints.  Objective: Vital signs in last 24 hours: Temp:  [97.6 F (36.4 C)-97.9 F (36.6 C)] 97.7 F (36.5 C) (11/30 0500) Pulse Rate:  [73-89] 84 (11/30 0500) Resp:  [18] 18 (11/30 0500) BP: (137-143)/(78-89) 139/78 (11/30 0500) SpO2:  [95 %-100 %] 100 % (11/30 0500) Last BM Date: 03/17/20 100 p.o. recorded 400 IV recorded No other intake or output recorded. T-max 99.1 vital signs are stable CMP shows significant rise in LFTs:  Total bilirubin 0.8(11/28)>>3.0>>6.9(11/30)                AST 31>>350>>322                ALT 22>>146>>227 Intake/Output from previous day: 11/29 0701 - 11/30 0700 In: 505.8 [P.O.:100; I.V.:400; IV Piggyback:5.8] Out: -  Intake/Output this shift: No intake/output data recorded.  General appearance: alert, cooperative and no distress Resp: clear to auscultation bilaterally GI: Soft, less tender this AM.  No distention no peritonitis.  Lab Results:  Recent Labs    03/17/20 0848 03/18/20 0435  WBC 9.1 6.3  HGB 12.4 12.5  HCT 39.6 39.6  PLT 197 181    BMET Recent Labs    03/17/20 0848 03/18/20 0435  NA 139 135  K 4.0 3.9  CL 103 101  CO2 25 24  GLUCOSE 134* 103*  BUN 12 10  CREATININE 0.96 0.76  CALCIUM 8.6* 9.0   PT/INR Recent Labs    03/18/20 0435  LABPROT 14.3  INR 1.2    Recent Labs  Lab 03/16/20 1648 03/17/20 0848 03/18/20 0435  AST 31 350* 322*  ALT 22 146* 227*  ALKPHOS 63 109 112  BILITOT 0.8 3.0* 6.9*  PROT 8.0 7.5 7.3  ALBUMIN 4.2 3.5 3.6     Lipase     Component Value Date/Time   LIPASE 32 03/17/2020 0848     Medications: . buPROPion  300 mg Oral Daily  . fluticasone  1 spray Each Nare Daily  . gabapentin  100 mg Oral QHS  . hydrochlorothiazide  25 mg Oral Daily  . insulin  aspart  0-9 Units Subcutaneous Q6H  . ketorolac  30 mg Intravenous Once  . loratadine  10 mg Oral Daily  . losartan  50 mg Oral Daily  . metoprolol tartrate  25 mg Oral BID  . multivitamin-iron-minerals-folic acid  1 tablet Oral Daily  . pantoprazole  80 mg Oral Daily  . potassium chloride  20 mEq Oral Daily  . traZODone  100 mg Oral QHS  . Vitamin D (Ergocalciferol)  50,000 Units Oral Q7 days    Assessment/Plan Hx chronic pain GERD Type 2 diabetes -currently on no metformin Hypertension Hx lumbar laminectomy/tubal ligation Atrial fibrillation - on lopressor   RUQ pain with cholelithiasis/possible cholecystitis - CT and [place] suggestive of possible cholecystitis but not definite - no leukocytosis and patient afebrile - LFTs and Tbili trending up - HIDA consistent with CBD obstruction vs hepatitis  - GI ordering MRCP - will check hepatitis panel as well  - ultimately patient may need her gallbladder out,   - Hepatitis C positive this AM, Bilirubin up to 6.9  - ERCP scheduled for 03/20/20 @ 1530  FEN:  Ok to have CLD until MN and then  would make NPO from a gen surgery standpoint  ID: Rocephin times 111/28/21; Zosyn 11/28  >> day 3 DVT: SCD, ok to have chemical DVT prophylaxis from a general surgery standpoint  Plan: Reviewed by GI and they plan ERCP 12/2@1530  hrs.  Will follow with you.  They are going to give her a diet and see how she does.       LOS: 2 days    Adon Gehlhausen 03/18/2020 Please see Amion

## 2020-03-19 DIAGNOSIS — K8020 Calculus of gallbladder without cholecystitis without obstruction: Secondary | ICD-10-CM | POA: Diagnosis not present

## 2020-03-19 DIAGNOSIS — K8050 Calculus of bile duct without cholangitis or cholecystitis without obstruction: Secondary | ICD-10-CM | POA: Diagnosis not present

## 2020-03-19 DIAGNOSIS — R7989 Other specified abnormal findings of blood chemistry: Secondary | ICD-10-CM | POA: Diagnosis not present

## 2020-03-19 DIAGNOSIS — F32A Depression, unspecified: Secondary | ICD-10-CM

## 2020-03-19 DIAGNOSIS — K8001 Calculus of gallbladder with acute cholecystitis with obstruction: Secondary | ICD-10-CM | POA: Diagnosis not present

## 2020-03-19 DIAGNOSIS — R1011 Right upper quadrant pain: Secondary | ICD-10-CM | POA: Diagnosis not present

## 2020-03-19 DIAGNOSIS — R768 Other specified abnormal immunological findings in serum: Secondary | ICD-10-CM | POA: Diagnosis not present

## 2020-03-19 LAB — COMPREHENSIVE METABOLIC PANEL
ALT: 173 U/L — ABNORMAL HIGH (ref 0–44)
AST: 133 U/L — ABNORMAL HIGH (ref 15–41)
Albumin: 3.8 g/dL (ref 3.5–5.0)
Alkaline Phosphatase: 107 U/L (ref 38–126)
Anion gap: 9 (ref 5–15)
BUN: 9 mg/dL (ref 8–23)
CO2: 26 mmol/L (ref 22–32)
Calcium: 9.3 mg/dL (ref 8.9–10.3)
Chloride: 102 mmol/L (ref 98–111)
Creatinine, Ser: 0.95 mg/dL (ref 0.44–1.00)
GFR, Estimated: >60 mL/min (ref >60)
Glucose, Bld: 101 mg/dL — ABNORMAL HIGH (ref 70–99)
Potassium: 3.7 mmol/L (ref 3.5–5.1)
Sodium: 137 mmol/L (ref 135–145)
Total Bilirubin: 2.2 mg/dL — ABNORMAL HIGH (ref 0.3–1.2)
Total Protein: 7.6 g/dL (ref 6.5–8.1)

## 2020-03-19 LAB — CBC WITH DIFFERENTIAL/PLATELET
Abs Immature Granulocytes: 0.02 10*3/uL (ref 0.00–0.07)
Basophils Absolute: 0 10*3/uL (ref 0.0–0.1)
Basophils Relative: 1 %
Eosinophils Absolute: 0.2 10*3/uL (ref 0.0–0.5)
Eosinophils Relative: 4 %
HCT: 41 % (ref 36.0–46.0)
Hemoglobin: 12.8 g/dL (ref 12.0–15.0)
Immature Granulocytes: 0 %
Lymphocytes Relative: 33 %
Lymphs Abs: 1.6 10*3/uL (ref 0.7–4.0)
MCH: 27.8 pg (ref 26.0–34.0)
MCHC: 31.2 g/dL (ref 30.0–36.0)
MCV: 89.1 fL (ref 80.0–100.0)
Monocytes Absolute: 0.4 10*3/uL (ref 0.1–1.0)
Monocytes Relative: 8 %
Neutro Abs: 2.6 10*3/uL (ref 1.7–7.7)
Neutrophils Relative %: 54 %
Platelets: 201 10*3/uL (ref 150–400)
RBC: 4.6 MIL/uL (ref 3.87–5.11)
RDW: 14.2 % (ref 11.5–15.5)
WBC: 4.9 10*3/uL (ref 4.0–10.5)
nRBC: 0 % (ref 0.0–0.2)

## 2020-03-19 LAB — GLUCOSE, CAPILLARY
Glucose-Capillary: 121 mg/dL — ABNORMAL HIGH (ref 70–99)
Glucose-Capillary: 155 mg/dL — ABNORMAL HIGH (ref 70–99)
Glucose-Capillary: 105 mg/dL — ABNORMAL HIGH (ref 70–99)
Glucose-Capillary: 125 mg/dL — ABNORMAL HIGH (ref 70–99)

## 2020-03-19 LAB — HCV RNA QUANT: HCV Quantitative: NOT DETECTED IU/mL (ref >50)

## 2020-03-19 LAB — MAGNESIUM: Magnesium: 2 mg/dL (ref 1.7–2.4)

## 2020-03-19 LAB — HEPATITIS B SURFACE ANTIBODY, QUANTITATIVE: Hep B S AB Quant (Post): 21.9 m[IU]/mL (ref >9.9)

## 2020-03-19 LAB — HEPATITIS A ANTIBODY, TOTAL: hep A Total Ab: REACTIVE — AB

## 2020-03-19 MED ORDER — GUAIFENESIN-DM 100-10 MG/5ML PO SYRP
5.0000 mL | ORAL_SOLUTION | ORAL | Status: DC | PRN
  Administered 2020-03-19: 5 mL via ORAL
  Filled 2020-03-19: qty 10

## 2020-03-19 NOTE — Progress Notes (Signed)
PROGRESS NOTE    Norma [name]  YHC:[id number] DOB: 1960-07-07 DOA: 03/16/2020 PCP: [name], NP    Chief Complaint  Patient presents with  . Abdominal Pain    Brief Narrative:  HPI per [name] is a 59 y.o. female with medical history significant of type 2 diabetes not on meds, hypertension, depression, anxiety, GERD, chronic pain presenting with complaints of abdominal pain.  Patient stated he normally never gets abdominal pain after eating.  However, Friday night after eating dinner she experienced cramping generalized abdominal pain.  Today she is feeling pain mostly in her right upper quadrant.  She describes it as a cramping sensation that is constant.  She has felt nauseous but has not vomited.  Denies history of any gallbladder problems.  Denies fevers or chills.  She has been vaccinated against Covid.  Denies shortness of breath, cough, and chest pain.  ED Course: Blood pressure significantly elevated with systolic above 517, remainder of vital signs stable.  WBC 7.8, hemoglobin 13.4, hematocrit 42.0, platelet 211K.  Sodium 138, potassium 3.6, chloride 104, bicarb 25, BUN 12, creatinine 0.6, glucose 111.  Lipase and LFTs normal.  UA with negative nitrite, trace amount of leukocytes, 6-10 WBCs, and no bacteria.  Screening SARS-CoV-2 PCR test and influenza panel pending.  CT abdomen pelvis showing findings concerning for acalculous acute cholecystitis.  Stable left adnexal cyst, likely ovarian in origin.  Further correlation with pelvic ultrasound recommended.  Right upper quadrant abdominal ultrasound showing cholelithiasis and gallbladder sludge with no findings to suggest acute cholecystitis.  CBD diameter at the upper limit of normal/borderline enlarged at 8 mm with choledocholithiasis more distally not excluded.  Also showing evidence of hepatic steatosis.  Patient was given fentanyl, Dilaudid, morphine, Zofran, Zosyn, and ceftriaxone.  ED provider  discussed the case with on-call physician for [facility] who recommended obtaining a HIDA scan and if there is concern for cholecystitis/biliary colic then consult general surgery in the morning.  ERCP not recommended as LFTs are normal.  GI will see the patient in the morning.  Assessment & Plan:   Principal Problem:   Cholelithiasis Active Problems:   Depression   Abdominal pain   Hypertensive urgency   Type 2 diabetes mellitus (HCC)   RUQ pain   Biliary colic symptom   Elevated liver enzymes   Abnormal findings on diagnostic imaging of liver and biliary tract   HCV antibody positive  1 right upper quadrant abdominal pain/cholelithiasis/possible acute cholecystitis Patient denies any further abdominal pain today 03/19/2020. On admission lipase levels within normal limits.  LFTs were within normal limits on admission however rising.  AST 322 from 350, ALT of 227 from 146.  Bilirubin elevated at 6.9 today from 3.0 yesterday.  CT abdomen and pelvis done on admission with findings consistent with a calculus acute cholecystitis, stable left adnexal cyst likely ovarian in origin.  Right upper quadrant abdominal ultrasound done with cholelithiasis and gallbladder sludge with no findings to suggest acute cholecystitis.  Common bile duct measures upper limits of normal 8 mm with choledocholithiasis more distally not excluded, hepatic steatosis.  HIDA scan obtained today with imaging findings consistent with either common bile duct obstruction or hepatocellular dysfunction secondary to hepatitis.  Acute hepatitis panel with hepatitis C antibody reactive.  Check a hepatitis C RNA.  MRCP which was done positive for cholelithiasis with some subtle changes which could be indicative of early acute cholecystitis, including mild gallbladder wall thickening, trace amount of pericholecystic  fluid, mildly dilated common bile duct at 10 mm in the porta hepatis, there is no choledocholithiasis, no intrahepatic  biliary ductal dilatation to suggest frank biliary tract obstruction, hepatic steatosis.  Patient initially started on a clear liquid diet this morning and has been advanced to a full liquid diet.  Continue IV Zosyn.  GI following and planning for ERCP tomorrow 03/20/2020.  General surgery following and patient likely will require cholecystectomy post ERCP.   2 + HCV antibody Check HCV RNA.  GI following.  3.  Diet-controlled diabetes mellitus type 2 versus prediabetes Not on any medications prior to admission.  Hemoglobin A1c 6.1.  Continue with fingersticks before meals and at bedtime Maintain hypoglycemic protocol and sliding scale insulin  4.  Hypertension/hypertensive urgency Controlled on home regimen of HCTZ, Cozaar.  Hydralazine as needed.   5.  Abnormal urinalysis Urine culture showed mixed flora with no identifiable pathogen. Monitor off antibiotics.    6.  Left adnexal cyst Noted on CT abdomen and pelvis.  It was noted per admitting physician that patient was seen by OB/GYN in September 2020 for an endometrial mass, biopsies revealed atrophic endometrium with benign endometrial polyp, no atypia or malignancy.  Outpatient follow-up with OB/GYN.  7.  Depression/anxiety Continue home regimen Wellbutrin, trazodone.  Outpatient follow-up.    DVT prophylaxis: SCDs Code Status: Full Family Communication: No family member at bedside. Disposition: Possible discharge home on Friday  Status is: Patient qualifies for inpatient    Dispo: The patient is from: Home              Anticipated d/c is to: Home              Anticipated d/c date is: 3 to 4 days.              Patient currently with right upper quadrant pain, concerning for possible acute cholecystitis, CBD obstruction.  Work-up underway.  On IV pain medications.  Not stable for discharge.        Consultants:   Gastroenterology: Dr. Hilarie Fredrickson 03/17/2020  General surgery: Dr. Hassell Done 03/17/2020  Procedures:   CT  abdomen and pelvis 03/16/2020  Right upper quadrant ultrasound 03/16/2020  HIDA scan 03/17/2020  MRCP 03/17/2020  Antimicrobials:   IV Zosyn 03/17/2020>>>>   Subjective: Patient sitting up in bed about to order a clear liquid tray.  Denies any chest pain or shortness of breath.  States right upper quadrant abdominal pain has improved since yesterday.  No nausea or emesis.  Feeling better.   Objective: Vitals:   03/18/20 1442 03/18/20 2020 03/19/20 0320 03/19/20 1335  BP: 127/88 113/74 108/64 106/67  Pulse: 79 85 70 73  Resp: 19 18 15    Temp: 97.8 F (36.6 C) 98.3 F (36.8 C) 98 F (36.7 C) (!) 97.5 F (36.4 C)  TempSrc: Oral Oral  Oral  SpO2: (!) 87%  96% 96%  Weight:      Height:        Intake/Output Summary (Last 24 hours) at 03/19/2020 1706 Last data filed at 03/19/2020 1426 Gross per 24 hour  Intake 474 ml  Output --  Net 474 ml   Filed Weights   03/17/20 0544  Weight: 114 kg    Examination:  General exam: NAD Respiratory system: Lungs clear to auscultation bilaterally.  No wheezes, no crackles, no rhonchi.  Normal respiratory effort.  Speaking in full sentences.   Cardiovascular system: Regular rate rhythm no murmurs rubs or gallops.  No JVD.  No  lower extremity edema.  Gastrointestinal system: Abdomen is soft, nondistended, decreased tenderness to palpation right upper quadrant.  Positive bowel sounds.  No rebound.  No guarding.  Central nervous system: Alert and oriented. No focal neurological deficits. Extremities: Symmetric 5 x 5 power. Skin: No rashes, lesions or ulcers Psychiatry: Judgement and insight appear normal. Mood & affect appropriate.     Data Reviewed: I have personally reviewed following labs and imaging studies  CBC: Recent Labs  Lab 03/16/20 1648 03/17/20 0848 03/18/20 0435 03/19/20 0753  WBC 7.8 9.1 6.3 4.9  NEUTROABS  --  6.8  --  2.6  HGB 13.4 12.4 12.5 12.8  HCT 42.0 39.6 39.6 41.0  MCV 87.3 89.0 87.8 89.1  PLT 211 197  181 568    Basic Metabolic Panel: Recent Labs  Lab 03/16/20 1648 03/17/20 0848 03/18/20 0435 03/19/20 0753  NA 138 139 135 137  K 3.6 4.0 3.9 3.7  CL 104 103 101 102  CO2 25 25 24 26   GLUCOSE 111* 134* 103* 101*  BUN 12 12 10 9   CREATININE 0.60 0.96 0.76 0.95  CALCIUM 9.4 8.6* 9.0 9.3  MG  --  1.9 2.0 2.0    GFR: Estimated Creatinine Clearance: 82.4 mL/min (by C-G formula based on SCr of 0.95 mg/dL).  Liver Function Tests: Recent Labs  Lab 03/16/20 1648 03/17/20 0848 03/18/20 0435 03/19/20 0753  AST 31 350* 322* 133*  ALT 22 146* 227* 173*  ALKPHOS 63 109 112 107  BILITOT 0.8 3.0* 6.9* 2.2*  PROT 8.0 7.5 7.3 7.6  ALBUMIN 4.2 3.5 3.6 3.8    CBG: Recent Labs  Lab 03/18/20 1756 03/18/20 2123 03/19/20 0723 03/19/20 1145 03/19/20 1651  GLUCAP 97 122* 121* 155* 105*     Recent Results (from the past 240 hour(s))  Resp Panel by RT-PCR (Flu A&B, Covid) Nasopharyngeal Swab     Status: None   Collection Time: 03/16/20  8:21 PM   Specimen: Nasopharyngeal Swab; Nasopharyngeal(NP) swabs in vial transport medium  Result Value Ref Range Status   SARS Coronavirus 2 by RT PCR NEGATIVE NEGATIVE Final    Comment: (NOTE) SARS-CoV-2 target nucleic acids are NOT DETECTED.  The SARS-CoV-2 RNA is generally detectable in upper respiratory specimens during the acute phase of infection. The lowest concentration of SARS-CoV-2 viral copies this assay can detect is 138 copies/mL. A negative result does not preclude SARS-Cov-2 infection and should not be used as the sole basis for treatment or other patient management decisions. A negative result may occur with  improper specimen collection/handling, submission of specimen other than nasopharyngeal swab, presence of viral mutation(s) within the areas targeted by this assay, and inadequate number of viral copies(<138 copies/mL). A negative result must be combined with clinical observations, patient history, and  epidemiological information. The expected result is Negative.  Fact Sheet for Patients:  EntrepreneurPulse.com.au  Fact Sheet for Healthcare Providers:  IncredibleEmployment.be  This test is no t yet approved or cleared by the [place] FDA and  has been authorized for detection and/or diagnosis of SARS-CoV-2 by FDA under an Emergency Use Authorization (EUA). This EUA will remain  in effect (meaning this test can be used) for the duration of the COVID-19 declaration under Section 564(b)(1) of the Act, 21 U.S.C.section 360bbb-3(b)(1), unless the authorization is terminated  or revoked sooner.       Influenza A by PCR NEGATIVE NEGATIVE Final   Influenza B by PCR NEGATIVE NEGATIVE Final    Comment: (NOTE) The Xpert  Xpress SARS-CoV-2/FLU/RSV plus assay is intended as an aid in the diagnosis of influenza from Nasopharyngeal swab specimens and should not be used as a sole basis for treatment. Nasal washings and aspirates are unacceptable for Xpert Xpress SARS-CoV-2/FLU/RSV testing.  Fact Sheet for Patients: EntrepreneurPulse.com.au  Fact Sheet for Healthcare Providers: IncredibleEmployment.be  This test is not yet approved or cleared by the [place] FDA and has been authorized for detection and/or diagnosis of SARS-CoV-2 by FDA under an Emergency Use Authorization (EUA). This EUA will remain in effect (meaning this test can be used) for the duration of the COVID-19 declaration under Section 564(b)(1) of the Act, 21 U.S.C. section 360bbb-3(b)(1), unless the authorization is terminated or revoked.  Performed at [facility] [place] [address] [place], [place] [address]   Culture, Urine     Status: Abnormal   Collection Time: 03/16/20 10:08 PM   Specimen: Urine, Clean Catch  Result Value Ref Range Status   Specimen Description   Final    URINE, CLEAN CATCH Performed at [facility], [place] [address], [place], [place] [address]    Special Requests   Final    NONE Performed at [facility], [place] [address], [place], [place] [address]    Culture MULTIPLE SPECIES PRESENT, SUGGEST RECOLLECTION (A)  Final   Report Status 03/18/2020 FINAL  Final         Radiology Studies: MR ABDOMEN MRCP WO CONTRAST  Result Date: 03/18/2020 CLINICAL DATA:  59 year old female with history of right-sided abdominal pain for the past 3 days. EXAM: MRI ABDOMEN WITHOUT CONTRAST  (INCLUDING MRCP) TECHNIQUE: Multiplanar multisequence MR imaging of the abdomen was performed. Heavily T2-weighted images of the biliary and pancreatic ducts were obtained, and three-dimensional MRCP images were rendered by post processing. COMPARISON:  No prior abdominal MRI. CT the abdomen and pelvis 03/16/2020. FINDINGS: Comment: Today's study is limited for detection and characterization of visceral and/or vascular lesions by lack of IV gadolinium. Lower chest: Trace right pleural effusion lying dependently. Hepatobiliary: Diffuse loss of signal intensity throughout the hepatic parenchyma on out of phase dual echo images, indicative of a background of hepatic steatosis. No discrete cystic or solid hepatic lesions are confidently identified on today's noncontrast examination. No intrahepatic biliary ductal dilatation. Common bile duct is mildly dilated measuring 1 cm in the porta hepatis. No filling defects are noted within the common bile duct to suggest choledocholithiasis. There are multiple filling defects within the gallbladder dependently, compatible with small gallstones. Gallbladder is moderately distended. Portions of the gallbladder wall appear mildly thickened and edematous measuring up to 5 mm. Trace amount of pericholecystic fluid. Pancreas: No definite pancreatic mass or peripancreatic fluid collections or inflammatory changes are noted on today's noncontrast examination.  Spleen:  Unremarkable. Adrenals/Urinary Tract: Unenhanced appearance of the kidneys and bilateral adrenal glands is normal. No hydroureteronephrosis in the visualized portions of the abdomen. Stomach/Bowel: Visualized portions are unremarkable. Vascular/Lymphatic: No aneurysm identified in the visualized abdominal vasculature on today's noncontrast examination. No lymphadenopathy noted in the visualized portions of the abdomen. Other:  No significant volume of ascites. Musculoskeletal: No aggressive appearing osseous lesions are noted in the visualized portions of the skeleton. IMPRESSION: 1. Study is positive for cholelithiasis with some subtle changes which could be indicative of early acute cholecystitis, including mild gallbladder wall thickening and trace amount of pericholecystic fluid. 2. Although the common bile duct is mildly dilated measuring 10 mm in the porta hepatis, there is no choledocholithiasis. Additionally,  there is no intrahepatic biliary ductal dilatation to suggest frank biliary tract obstruction. 3. Hepatic steatosis. 4. Trace right pleural effusion lying dependently. Electronically Signed   By: Vinnie Langton M.D.   On: 03/18/2020 05:35   MR 3D Recon At Scanner  Result Date: 03/18/2020 CLINICAL DATA:  59 year old female with history of right-sided abdominal pain for the past 3 days. EXAM: MRI ABDOMEN WITHOUT CONTRAST  (INCLUDING MRCP) TECHNIQUE: Multiplanar multisequence MR imaging of the abdomen was performed. Heavily T2-weighted images of the biliary and pancreatic ducts were obtained, and three-dimensional MRCP images were rendered by post processing. COMPARISON:  No prior abdominal MRI. CT the abdomen and pelvis 03/16/2020. FINDINGS: Comment: Today's study is limited for detection and characterization of visceral and/or vascular lesions by lack of IV gadolinium. Lower chest: Trace right pleural effusion lying dependently. Hepatobiliary: Diffuse loss of signal intensity throughout  the hepatic parenchyma on out of phase dual echo images, indicative of a background of hepatic steatosis. No discrete cystic or solid hepatic lesions are confidently identified on today's noncontrast examination. No intrahepatic biliary ductal dilatation. Common bile duct is mildly dilated measuring 1 cm in the porta hepatis. No filling defects are noted within the common bile duct to suggest choledocholithiasis. There are multiple filling defects within the gallbladder dependently, compatible with small gallstones. Gallbladder is moderately distended. Portions of the gallbladder wall appear mildly thickened and edematous measuring up to 5 mm. Trace amount of pericholecystic fluid. Pancreas: No definite pancreatic mass or peripancreatic fluid collections or inflammatory changes are noted on today's noncontrast examination. Spleen:  Unremarkable. Adrenals/Urinary Tract: Unenhanced appearance of the kidneys and bilateral adrenal glands is normal. No hydroureteronephrosis in the visualized portions of the abdomen. Stomach/Bowel: Visualized portions are unremarkable. Vascular/Lymphatic: No aneurysm identified in the visualized abdominal vasculature on today's noncontrast examination. No lymphadenopathy noted in the visualized portions of the abdomen. Other:  No significant volume of ascites. Musculoskeletal: No aggressive appearing osseous lesions are noted in the visualized portions of the skeleton. IMPRESSION: 1. Study is positive for cholelithiasis with some subtle changes which could be indicative of early acute cholecystitis, including mild gallbladder wall thickening and trace amount of pericholecystic fluid. 2. Although the common bile duct is mildly dilated measuring 10 mm in the porta hepatis, there is no choledocholithiasis. Additionally, there is no intrahepatic biliary ductal dilatation to suggest frank biliary tract obstruction. 3. Hepatic steatosis. 4. Trace right pleural effusion lying dependently.  Electronically Signed   By: Vinnie Langton M.D.   On: 03/18/2020 05:35        Scheduled Meds: . buPROPion  300 mg Oral Daily  . fluticasone  1 spray Each Nare Daily  . gabapentin  100 mg Oral QHS  . hydrochlorothiazide  25 mg Oral Daily  . insulin aspart  0-9 Units Subcutaneous TID WC  . ketorolac  30 mg Intravenous Once  . loratadine  10 mg Oral Daily  . losartan  50 mg Oral Daily  . metoprolol tartrate  25 mg Oral BID  . multivitamin-iron-minerals-folic acid  1 tablet Oral Daily  . pantoprazole  80 mg Oral Daily  . potassium chloride  20 mEq Oral Daily  . traZODone  100 mg Oral QHS  . Vitamin D (Ergocalciferol)  50,000 Units Oral Q7 days   Continuous Infusions: . piperacillin-tazobactam (ZOSYN)  IV 3.375 g (03/19/20 1101)     LOS: 3 days    Time spent: 35 minutes    Elie Confer, MD [facility] Pager: [phone]  03/19/2020, 5:06 PM

## 2020-03-19 NOTE — Care Management Important Message (Signed)
Important Message  Patient Details IM Letter given to the Patient. Name: Norma [name] MRN: [id number] Date of Birth: 1961/03/20   Medicare Important Message Given:  Yes     Kerin Salen 03/19/2020, 10:06 AM

## 2020-03-19 NOTE — Progress Notes (Signed)
Subjective: Feeling better today.  No pain.  Eager to go home to help take care of her grandkids.  Hungry.    ROS: See above, otherwise other systems negative  Objective: Vital signs in last 24 hours: Temp:  [97.8 F (36.6 C)-98.3 F (36.8 C)] 98 F (36.7 C) (12/01 0320) Pulse Rate:  [70-85] 70 (12/01 0320) Resp:  [15-19] 15 (12/01 0320) BP: (108-127)/(64-88) 108/64 (12/01 0320) SpO2:  [87 %-96 %] 96 % (12/01 0320) Last BM Date: 03/15/20  Intake/Output from previous day: 11/30 0701 - 12/01 0700 In: 240 [P.O.:240] Out: 3 [Urine:3] Intake/Output this shift: No intake/output data recorded.  PE: Heart: regular Lungs: CTAB Abd: soft, NT, ND, +BS, obese  Lab Results:  Recent Labs    03/18/20 0435 03/19/20 0753  WBC 6.3 4.9  HGB 12.5 12.8  HCT 39.6 41.0  PLT 181 201   BMET Recent Labs    03/17/20 0848 03/18/20 0435  NA 139 135  K 4.0 3.9  CL 103 101  CO2 25 24  GLUCOSE 134* 103*  BUN 12 10  CREATININE 0.96 0.76  CALCIUM 8.6* 9.0   PT/INR Recent Labs    03/18/20 0435  LABPROT 14.3  INR 1.2   CMP     Component Value Date/Time   NA 135 03/18/2020 0435   NA 140 05/02/2019 1502   K 3.9 03/18/2020 0435   CL 101 03/18/2020 0435   CO2 24 03/18/2020 0435   GLUCOSE 103 (H) 03/18/2020 0435   BUN 10 03/18/2020 0435   BUN 11 05/02/2019 1502   CREATININE 0.76 03/18/2020 0435   CREATININE 0.94 06/05/2015 1640   CALCIUM 9.0 03/18/2020 0435   PROT 7.3 03/18/2020 0435   PROT 7.3 05/02/2019 1502   ALBUMIN 3.6 03/18/2020 0435   ALBUMIN 4.3 05/02/2019 1502   AST 322 (H) 03/18/2020 0435   ALT 227 (H) 03/18/2020 0435   ALKPHOS 112 03/18/2020 0435   BILITOT 6.9 (H) 03/18/2020 0435   BILITOT 0.6 05/02/2019 1502   GFRNONAA >60 03/18/2020 0435   GFRNONAA 68 06/05/2015 1640   GFRAA 91 05/02/2019 1502   GFRAA 78 06/05/2015 1640   Lipase     Component Value Date/Time   LIPASE 32 03/17/2020 0848       Studies/Results: NM Hepatobiliary Liver  Func  Result Date: 03/17/2020 CLINICAL DATA:  Concern for acute cholecystitis. EXAM: NUCLEAR MEDICINE HEPATOBILIARY IMAGING TECHNIQUE: Sequential images of the abdomen were obtained out to 60 minutes following intravenous administration of radiopharmaceutical. RADIOPHARMACEUTICALS:  5.5 mCi Tc-8m  Choletec IV COMPARISON:  Ultrasound 03/16/2020 FINDINGS: Prompt uptake by the liver is seen. Imaging was performed for 90 minutes with no biliary excretion identified. No small bowel or gallbladder activity identified. IMPRESSION: 1. There is diffuse tracer uptake by the liver with absent biliary excretion after 190 minutes. Imaging findings are consistent with either common bile duct obstruction or hepatocellular dysfunction secondary to hepatitis. Electronically Signed   By: Kerby Moors M.D.   On: 03/17/2020 14:51   MR ABDOMEN MRCP WO CONTRAST  Result Date: 03/18/2020 CLINICAL DATA:  59 year old female with history of right-sided abdominal pain for the past 3 days. EXAM: MRI ABDOMEN WITHOUT CONTRAST  (INCLUDING MRCP) TECHNIQUE: Multiplanar multisequence MR imaging of the abdomen was performed. Heavily T2-weighted images of the biliary and pancreatic ducts were obtained, and three-dimensional MRCP images were rendered by post processing. COMPARISON:  No prior abdominal MRI. CT the abdomen and pelvis 03/16/2020. FINDINGS: Comment: Today's study is  limited for detection and characterization of visceral and/or vascular lesions by lack of IV gadolinium. Lower chest: Trace right pleural effusion lying dependently. Hepatobiliary: Diffuse loss of signal intensity throughout the hepatic parenchyma on out of phase dual echo images, indicative of a background of hepatic steatosis. No discrete cystic or solid hepatic lesions are confidently identified on today's noncontrast examination. No intrahepatic biliary ductal dilatation. Common bile duct is mildly dilated measuring 1 cm in the porta hepatis. No filling defects  are noted within the common bile duct to suggest choledocholithiasis. There are multiple filling defects within the gallbladder dependently, compatible with small gallstones. Gallbladder is moderately distended. Portions of the gallbladder wall appear mildly thickened and edematous measuring up to 5 mm. Trace amount of pericholecystic fluid. Pancreas: No definite pancreatic mass or peripancreatic fluid collections or inflammatory changes are noted on today's noncontrast examination. Spleen:  Unremarkable. Adrenals/Urinary Tract: Unenhanced appearance of the kidneys and bilateral adrenal glands is normal. No hydroureteronephrosis in the visualized portions of the abdomen. Stomach/Bowel: Visualized portions are unremarkable. Vascular/Lymphatic: No aneurysm identified in the visualized abdominal vasculature on today's noncontrast examination. No lymphadenopathy noted in the visualized portions of the abdomen. Other:  No significant volume of ascites. Musculoskeletal: No aggressive appearing osseous lesions are noted in the visualized portions of the skeleton. IMPRESSION: 1. Study is positive for cholelithiasis with some subtle changes which could be indicative of early acute cholecystitis, including mild gallbladder wall thickening and trace amount of pericholecystic fluid. 2. Although the common bile duct is mildly dilated measuring 10 mm in the porta hepatis, there is no choledocholithiasis. Additionally, there is no intrahepatic biliary ductal dilatation to suggest frank biliary tract obstruction. 3. Hepatic steatosis. 4. Trace right pleural effusion lying dependently. Electronically Signed   By: Vinnie Langton M.D.   On: 03/18/2020 05:35   MR 3D Recon At Scanner  Result Date: 03/18/2020 CLINICAL DATA:  59 year old female with history of right-sided abdominal pain for the past 3 days. EXAM: MRI ABDOMEN WITHOUT CONTRAST  (INCLUDING MRCP) TECHNIQUE: Multiplanar multisequence MR imaging of the abdomen was  performed. Heavily T2-weighted images of the biliary and pancreatic ducts were obtained, and three-dimensional MRCP images were rendered by post processing. COMPARISON:  No prior abdominal MRI. CT the abdomen and pelvis 03/16/2020. FINDINGS: Comment: Today's study is limited for detection and characterization of visceral and/or vascular lesions by lack of IV gadolinium. Lower chest: Trace right pleural effusion lying dependently. Hepatobiliary: Diffuse loss of signal intensity throughout the hepatic parenchyma on out of phase dual echo images, indicative of a background of hepatic steatosis. No discrete cystic or solid hepatic lesions are confidently identified on today's noncontrast examination. No intrahepatic biliary ductal dilatation. Common bile duct is mildly dilated measuring 1 cm in the porta hepatis. No filling defects are noted within the common bile duct to suggest choledocholithiasis. There are multiple filling defects within the gallbladder dependently, compatible with small gallstones. Gallbladder is moderately distended. Portions of the gallbladder wall appear mildly thickened and edematous measuring up to 5 mm. Trace amount of pericholecystic fluid. Pancreas: No definite pancreatic mass or peripancreatic fluid collections or inflammatory changes are noted on today's noncontrast examination. Spleen:  Unremarkable. Adrenals/Urinary Tract: Unenhanced appearance of the kidneys and bilateral adrenal glands is normal. No hydroureteronephrosis in the visualized portions of the abdomen. Stomach/Bowel: Visualized portions are unremarkable. Vascular/Lymphatic: No aneurysm identified in the visualized abdominal vasculature on today's noncontrast examination. No lymphadenopathy noted in the visualized portions of the abdomen. Other:  No significant volume of  ascites. Musculoskeletal: No aggressive appearing osseous lesions are noted in the visualized portions of the skeleton. IMPRESSION: 1. Study is positive for  cholelithiasis with some subtle changes which could be indicative of early acute cholecystitis, including mild gallbladder wall thickening and trace amount of pericholecystic fluid. 2. Although the common bile duct is mildly dilated measuring 10 mm in the porta hepatis, there is no choledocholithiasis. Additionally, there is no intrahepatic biliary ductal dilatation to suggest frank biliary tract obstruction. 3. Hepatic steatosis. 4. Trace right pleural effusion lying dependently. Electronically Signed   By: Vinnie Langton M.D.   On: 03/18/2020 05:35    Anti-infectives: Anti-infectives (From admission, onward)   Start     Dose/Rate Route Frequency Ordered Stop   03/17/20 1800  piperacillin-tazobactam (ZOSYN) IVPB 3.375 g        3.375 g 12.5 mL/hr over 240 Minutes Intravenous Every 8 hours 03/17/20 1613     03/17/20 1700  piperacillin-tazobactam (ZOSYN) IVPB 3.375 g  Status:  Discontinued        3.375 g 100 mL/hr over 30 Minutes Intravenous Every 6 hours 03/17/20 1610 03/17/20 1613   03/16/20 2045  piperacillin-tazobactam (ZOSYN) IVPB 3.375 g        3.375 g 100 mL/hr over 30 Minutes Intravenous  Once 03/16/20 2032 03/16/20 2114   03/16/20 2030  cefTRIAXone (ROCEPHIN) 2 g in sodium chloride 0.9 % 100 mL IVPB  Status:  Discontinued        2 g 200 mL/hr over 30 Minutes Intravenous  Once 03/16/20 2020 03/16/20 2142   03/16/20 2030  metroNIDAZOLE (FLAGYL) IVPB 500 mg  Status:  Discontinued        500 mg 100 mL/hr over 60 Minutes Intravenous  Once 03/16/20 2020 03/16/20 2032       Assessment/Plan Hx chronic pain GERD Type 2 diabetes-currently on no metformin Hypertension Hx lumbar laminectomy/tubal ligation Atrial fibrillation - on lopressor  Hepatitis C - viral load pending, acute vs chronic  RUQpain with cholelithiasis/possible cholecystitis - CT, [place], and MCRP suggestive of possible cholecystitis but not definite.  No pain, no fever, no WBC -TB down to 2.2, AST/ALT down.  Given  this it is likely that these were elevated from a CBD stone and she has passed it. - ultimately patient may need her gallbladder out, will D/W MD  - ERCP scheduled for 03/20/20 @ 71  FEN:Ok for diet from surgical standpoint YN:[id number] times 111/28/21; Zosyn 11/28>> day 3 DVT: SCD, ok to have chemical DVT prophylaxis from a general surgery standpoint   LOS: 3 days    Henreitta Cea , [facility] 03/19/2020, 9:03 AM Please see Amion for pager number during day hours 7:00am-4:30pm or 7:00am -11:30am on weekends

## 2020-03-19 NOTE — Plan of Care (Signed)
  Problem: Clinical Measurements: Goal: Will remain free from infection Outcome: Progressing   Problem: Nutrition: Goal: Adequate nutrition will be maintained Outcome: Progressing   Problem: Coping: Goal: Level of anxiety will decrease Outcome: Progressing   Problem: Education: Goal: Ability to describe self-care measures that may prevent or decrease complications (Diabetes Survival Skills Education) will improve Outcome: Progressing   Problem: Coping: Goal: Ability to adjust to condition or change in health will improve Outcome: Progressing   Problem: Fluid Volume: Goal: Ability to maintain a balanced intake and output will improve Outcome: Progressing   Problem: Health Behavior/Discharge Planning: Goal: Ability to identify and utilize available resources and services will improve Outcome: Progressing

## 2020-03-19 NOTE — Progress Notes (Signed)
Progress Note  Chief Complaint:    Abdominal pain and abnormal liver tests     ASSESSMENT / PLAN:    # RUQ pain / elevated LFT / cholelithiasis, probable cholecystitis.  MRCP two days ago remarkable for dilated CBD at 10 mm, no choledocholithiasis, subtle changes suspicious for early acute cholecystitis. Her bilirubin rose from 3 to nearly 7 yesterday. However, her pain has significantly improved and bilirubin down to 2.2, enzymes also improved overnight.    --We have made arrangements for EUS / ERCP to be done tomorrow but ask that CCS consider cholecystectomy with IOC instead since pain and liver chemistries much improved. Otherwise we will proceed as planned tomorrow.   # Positive HCV ab, viral load pending. She gives remote history of undergoing treatment for HCV. Therefore if HCV RNA PCR positive then presumably she was a non-responder. If PCR negative then virus was eradicated ( HCV ab will always remain positive). If turns out to have chronic HCV then will need outpatient treatment with ID or [facility].  --She has hepatitis A immunity and hepatitis B immunity ( through vaccination as core ab is negative) --Please make sure information about hepatitis C is not discussed in presence of any family members       SUBJECTIVE:   No abdominal pain. Tolerating liquids.     OBJECTIVE:    Scheduled inpatient medications:   buPROPion  300 mg Oral Daily   fluticasone  1 spray Each Nare Daily   gabapentin  100 mg Oral QHS   hydrochlorothiazide  25 mg Oral Daily   insulin aspart  0-9 Units Subcutaneous TID WC   ketorolac  30 mg Intravenous Once   loratadine  10 mg Oral Daily   losartan  50 mg Oral Daily   metoprolol tartrate  25 mg Oral BID   multivitamin-iron-minerals-folic acid  1 tablet Oral Daily   pantoprazole  80 mg Oral Daily   potassium chloride  20 mEq Oral Daily   traZODone  100 mg Oral QHS   Vitamin D (Ergocalciferol)  50,000 Units Oral Q7  days   Continuous inpatient infusions:   sodium chloride 100 mL/hr at 03/19/20 0103   piperacillin-tazobactam (ZOSYN)  IV 3.375 g (03/19/20 1101)   PRN inpatient medications: acetaminophen **OR** acetaminophen, cyclobenzaprine, gadobutrol, hydrALAZINE, morphine injection, ondansetron (ZOFRAN) IV  Vital signs in last 24 hours: Temp:  [97.8 F (36.6 C)-98.3 F (36.8 C)] 98 F (36.7 C) (12/01 0320) Pulse Rate:  [70-85] 70 (12/01 0320) Resp:  [15-19] 15 (12/01 0320) BP: (108-127)/(64-88) 108/64 (12/01 0320) SpO2:  [87 %-96 %] 96 % (12/01 0320) Last BM Date: 03/15/20  Intake/Output Summary (Last 24 hours) at 03/19/2020 1218 Last data filed at 03/19/2020 0940 Gross per 24 hour  Intake 120 ml  Output 1 ml  Net 119 ml     Physical Exam:   General: Alert female in NAD  Heart:  Regular rate and rhythm. No lower extremity edema  Pulmonary: Normal respiratory effort  Abdomen: Soft, nondistended, nontender. Normal bowel sounds.   Neurologic: Alert and oriented  Psych: Pleasant. Cooperative.   Filed Weights   03/17/20 0544  Weight: 114 kg    Intake/Output from previous day: 11/30 0701 - 12/01 0700 In: 240 [P.O.:240] Out: 3 [Urine:3] Intake/Output this shift: Total I/O In: 120 [P.O.:120] Out: -     Lab Results: Recent Labs    03/17/20 0848 03/18/20 0435 03/19/20 0753  WBC 9.1 6.3 4.9  HGB 12.4 12.5  12.8  HCT 39.6 39.6 41.0  PLT 197 181 201   BMET Recent Labs    03/17/20 0848 03/18/20 0435 03/19/20 0753  NA 139 135 137  K 4.0 3.9 3.7  CL 103 101 102  CO2 25 24 26   GLUCOSE 134* 103* 101*  BUN 12 10 9   CREATININE 0.96 0.76 0.95  CALCIUM 8.6* 9.0 9.3   LFT Recent Labs    03/19/20 0753  PROT 7.6  ALBUMIN 3.8  AST 133*  ALT 173*  ALKPHOS 107  BILITOT 2.2*   PT/INR Recent Labs    03/18/20 0435  LABPROT 14.3  INR 1.2   Hepatitis Panel Recent Labs    03/18/20 0435  HEPBSAG NON REACTIVE  HCVAB Reactive*  HEPAIGM NON REACTIVE  HEPBIGM  NON REACTIVE    NM Hepatobiliary Liver Func  Result Date: 03/17/2020 CLINICAL DATA:  Concern for acute cholecystitis. EXAM: NUCLEAR MEDICINE HEPATOBILIARY IMAGING TECHNIQUE: Sequential images of the abdomen were obtained out to 60 minutes following intravenous administration of radiopharmaceutical. RADIOPHARMACEUTICALS:  5.5 mCi Tc-12m  Choletec IV COMPARISON:  Ultrasound 03/16/2020 FINDINGS: Prompt uptake by the liver is seen. Imaging was performed for 90 minutes with no biliary excretion identified. No small bowel or gallbladder activity identified. IMPRESSION: 1. There is diffuse tracer uptake by the liver with absent biliary excretion after 190 minutes. Imaging findings are consistent with either common bile duct obstruction or hepatocellular dysfunction secondary to hepatitis. Electronically Signed   By: Kerby Moors M.D.   On: 03/17/2020 14:51   MR ABDOMEN MRCP WO CONTRAST  Result Date: 03/18/2020 CLINICAL DATA:  59 year old female with history of right-sided abdominal pain for the past 3 days. EXAM: MRI ABDOMEN WITHOUT CONTRAST  (INCLUDING MRCP) TECHNIQUE: Multiplanar multisequence MR imaging of the abdomen was performed. Heavily T2-weighted images of the biliary and pancreatic ducts were obtained, and three-dimensional MRCP images were rendered by post processing. COMPARISON:  No prior abdominal MRI. CT the abdomen and pelvis 03/16/2020. FINDINGS: Comment: Today's study is limited for detection and characterization of visceral and/or vascular lesions by lack of IV gadolinium. Lower chest: Trace right pleural effusion lying dependently. Hepatobiliary: Diffuse loss of signal intensity throughout the hepatic parenchyma on out of phase dual echo images, indicative of a background of hepatic steatosis. No discrete cystic or solid hepatic lesions are confidently identified on today's noncontrast examination. No intrahepatic biliary ductal dilatation. Common bile duct is mildly dilated measuring 1 cm  in the porta hepatis. No filling defects are noted within the common bile duct to suggest choledocholithiasis. There are multiple filling defects within the gallbladder dependently, compatible with small gallstones. Gallbladder is moderately distended. Portions of the gallbladder wall appear mildly thickened and edematous measuring up to 5 mm. Trace amount of pericholecystic fluid. Pancreas: No definite pancreatic mass or peripancreatic fluid collections or inflammatory changes are noted on today's noncontrast examination. Spleen:  Unremarkable. Adrenals/Urinary Tract: Unenhanced appearance of the kidneys and bilateral adrenal glands is normal. No hydroureteronephrosis in the visualized portions of the abdomen. Stomach/Bowel: Visualized portions are unremarkable. Vascular/Lymphatic: No aneurysm identified in the visualized abdominal vasculature on today's noncontrast examination. No lymphadenopathy noted in the visualized portions of the abdomen. Other:  No significant volume of ascites. Musculoskeletal: No aggressive appearing osseous lesions are noted in the visualized portions of the skeleton. IMPRESSION: 1. Study is positive for cholelithiasis with some subtle changes which could be indicative of early acute cholecystitis, including mild gallbladder wall thickening and trace amount of pericholecystic fluid. 2. Although the common  bile duct is mildly dilated measuring 10 mm in the porta hepatis, there is no choledocholithiasis. Additionally, there is no intrahepatic biliary ductal dilatation to suggest frank biliary tract obstruction. 3. Hepatic steatosis. 4. Trace right pleural effusion lying dependently. Electronically Signed   By: Vinnie Langton M.D.   On: 03/18/2020 05:35   MR 3D Recon At Scanner  Result Date: 03/18/2020 CLINICAL DATA:  59 year old female with history of right-sided abdominal pain for the past 3 days. EXAM: MRI ABDOMEN WITHOUT CONTRAST  (INCLUDING MRCP) TECHNIQUE: Multiplanar  multisequence MR imaging of the abdomen was performed. Heavily T2-weighted images of the biliary and pancreatic ducts were obtained, and three-dimensional MRCP images were rendered by post processing. COMPARISON:  No prior abdominal MRI. CT the abdomen and pelvis 03/16/2020. FINDINGS: Comment: Today's study is limited for detection and characterization of visceral and/or vascular lesions by lack of IV gadolinium. Lower chest: Trace right pleural effusion lying dependently. Hepatobiliary: Diffuse loss of signal intensity throughout the hepatic parenchyma on out of phase dual echo images, indicative of a background of hepatic steatosis. No discrete cystic or solid hepatic lesions are confidently identified on today's noncontrast examination. No intrahepatic biliary ductal dilatation. Common bile duct is mildly dilated measuring 1 cm in the porta hepatis. No filling defects are noted within the common bile duct to suggest choledocholithiasis. There are multiple filling defects within the gallbladder dependently, compatible with small gallstones. Gallbladder is moderately distended. Portions of the gallbladder wall appear mildly thickened and edematous measuring up to 5 mm. Trace amount of pericholecystic fluid. Pancreas: No definite pancreatic mass or peripancreatic fluid collections or inflammatory changes are noted on today's noncontrast examination. Spleen:  Unremarkable. Adrenals/Urinary Tract: Unenhanced appearance of the kidneys and bilateral adrenal glands is normal. No hydroureteronephrosis in the visualized portions of the abdomen. Stomach/Bowel: Visualized portions are unremarkable. Vascular/Lymphatic: No aneurysm identified in the visualized abdominal vasculature on today's noncontrast examination. No lymphadenopathy noted in the visualized portions of the abdomen. Other:  No significant volume of ascites. Musculoskeletal: No aggressive appearing osseous lesions are noted in the visualized portions of the  skeleton. IMPRESSION: 1. Study is positive for cholelithiasis with some subtle changes which could be indicative of early acute cholecystitis, including mild gallbladder wall thickening and trace amount of pericholecystic fluid. 2. Although the common bile duct is mildly dilated measuring 10 mm in the porta hepatis, there is no choledocholithiasis. Additionally, there is no intrahepatic biliary ductal dilatation to suggest frank biliary tract obstruction. 3. Hepatic steatosis. 4. Trace right pleural effusion lying dependently. Electronically Signed   By: Vinnie Langton M.D.   On: 03/18/2020 05:35      Principal Problem:   Cholelithiasis Active Problems:   Depression   Abdominal pain   Hypertensive urgency   Type 2 diabetes mellitus (HCC)   RUQ pain   Biliary colic symptom   Elevated liver enzymes   Abnormal findings on diagnostic imaging of liver and biliary tract   HCV antibody positive     LOS: 3 days   Norma [name] ,NP 03/19/2020, 12:18 PM

## 2020-03-20 ENCOUNTER — Encounter (HOSPITAL_COMMUNITY)
Admission: EM | Disposition: A | Discharge status: Home / Self Care | Payer: Self-pay | Source: Home / Self Care | Attending: Internal Medicine

## 2020-03-20 ENCOUNTER — Inpatient Hospital Stay (HOSPITAL_COMMUNITY): Payer: Medicare Other | Admitting: Registered Nurse

## 2020-03-20 ENCOUNTER — Inpatient Hospital Stay (HOSPITAL_COMMUNITY): Payer: Medicare Other

## 2020-03-20 ENCOUNTER — Encounter (HOSPITAL_COMMUNITY): Payer: Self-pay | Admitting: Internal Medicine

## 2020-03-20 ENCOUNTER — Telehealth: Payer: Self-pay

## 2020-03-20 DIAGNOSIS — K8050 Calculus of bile duct without cholangitis or cholecystitis without obstruction: Secondary | ICD-10-CM | POA: Diagnosis not present

## 2020-03-20 DIAGNOSIS — K8001 Calculus of gallbladder with acute cholecystitis with obstruction: Secondary | ICD-10-CM | POA: Diagnosis not present

## 2020-03-20 DIAGNOSIS — R932 Abnormal findings on diagnostic imaging of liver and biliary tract: Secondary | ICD-10-CM | POA: Diagnosis not present

## 2020-03-20 DIAGNOSIS — R1011 Right upper quadrant pain: Secondary | ICD-10-CM | POA: Diagnosis not present

## 2020-03-20 HISTORY — PX: CHOLECYSTECTOMY: SHX55

## 2020-03-20 LAB — COMPREHENSIVE METABOLIC PANEL
ALT: 125 U/L — ABNORMAL HIGH (ref 0–44)
AST: 70 U/L — ABNORMAL HIGH (ref 15–41)
Albumin: 3.6 g/dL (ref 3.5–5.0)
Alkaline Phosphatase: 95 U/L (ref 38–126)
Anion gap: 10 (ref 5–15)
BUN: 10 mg/dL (ref 8–23)
CO2: 24 mmol/L (ref 22–32)
Calcium: 9.3 mg/dL (ref 8.9–10.3)
Chloride: 103 mmol/L (ref 98–111)
Creatinine, Ser: 0.92 mg/dL (ref 0.44–1.00)
GFR, Estimated: >60 mL/min (ref >60)
Glucose, Bld: 118 mg/dL — ABNORMAL HIGH (ref 70–99)
Potassium: 3.5 mmol/L (ref 3.5–5.1)
Sodium: 137 mmol/L (ref 135–145)
Total Bilirubin: 1.1 mg/dL (ref 0.3–1.2)
Total Protein: 7.6 g/dL (ref 6.5–8.1)

## 2020-03-20 LAB — CBC
HCT: 40.5 % (ref 36.0–46.0)
Hemoglobin: 12.9 g/dL (ref 12.0–15.0)
MCH: 28 pg (ref 26.0–34.0)
MCHC: 31.9 g/dL (ref 30.0–36.0)
MCV: 87.9 fL (ref 80.0–100.0)
Platelets: 206 10*3/uL (ref 150–400)
RBC: 4.61 MIL/uL (ref 3.87–5.11)
RDW: 14 % (ref 11.5–15.5)
WBC: 6.9 10*3/uL (ref 4.0–10.5)
nRBC: 0 % (ref 0.0–0.2)

## 2020-03-20 LAB — GLUCOSE, CAPILLARY
Glucose-Capillary: 105 mg/dL — ABNORMAL HIGH (ref 70–99)
Glucose-Capillary: 173 mg/dL — ABNORMAL HIGH (ref 70–99)

## 2020-03-20 LAB — PHOSPHORUS: Phosphorus: 3.4 mg/dL (ref 2.5–4.6)

## 2020-03-20 LAB — HCV RNA QUANT: HCV Quantitative: NOT DETECTED IU/mL (ref >50)

## 2020-03-20 LAB — MAGNESIUM: Magnesium: 2 mg/dL (ref 1.7–2.4)

## 2020-03-20 SURGERY — LAPAROSCOPIC CHOLECYSTECTOMY WITH INTRAOPERATIVE CHOLANGIOGRAM
Anesthesia: General | Site: Abdomen

## 2020-03-20 SURGERY — UPPER ESOPHAGEAL ENDOSCOPIC ULTRASOUND (EUS)
Anesthesia: General

## 2020-03-20 MED ORDER — ONDANSETRON HCL 4 MG/2ML IJ SOLN: 4.0000 mg | Freq: Four times a day (QID) | INTRAMUSCULAR | Status: DC | PRN

## 2020-03-20 MED ORDER — SUGAMMADEX SODIUM 200 MG/2ML IV SOLN
INTRAVENOUS | Status: DC | PRN
  Administered 2020-03-20: 200 mg via INTRAVENOUS

## 2020-03-20 MED ORDER — ROCURONIUM BROMIDE 10 MG/ML (PF) SYRINGE
PREFILLED_SYRINGE | INTRAVENOUS | Status: AC
  Filled 2020-03-20: qty 10

## 2020-03-20 MED ORDER — MIDAZOLAM HCL 2 MG/2ML IJ SOLN
INTRAMUSCULAR | Status: AC
  Filled 2020-03-20: qty 2

## 2020-03-20 MED ORDER — ONDANSETRON 4 MG PO TBDP: 4.0000 mg | ORAL_TABLET | Freq: Four times a day (QID) | ORAL | Status: DC | PRN

## 2020-03-20 MED ORDER — ROCURONIUM BROMIDE 10 MG/ML (PF) SYRINGE
PREFILLED_SYRINGE | INTRAVENOUS | Status: DC | PRN
  Administered 2020-03-20: 20 mg via INTRAVENOUS
  Administered 2020-03-20: 50 mg via INTRAVENOUS

## 2020-03-20 MED ORDER — 0.9 % SODIUM CHLORIDE (POUR BTL) OPTIME
TOPICAL | Status: DC | PRN
  Administered 2020-03-20: 1000 mL

## 2020-03-20 MED ORDER — ACETAMINOPHEN 500 MG PO TABS: 1000.0000 mg | ORAL_TABLET | Freq: Once | ORAL | Status: DC

## 2020-03-20 MED ORDER — DEXAMETHASONE SODIUM PHOSPHATE 10 MG/ML IJ SOLN
INTRAMUSCULAR | Status: AC
  Filled 2020-03-20: qty 1

## 2020-03-20 MED ORDER — FENTANYL CITRATE (PF) 100 MCG/2ML IJ SOLN
INTRAMUSCULAR | Status: AC
  Administered 2020-03-20: 50 ug via INTRAVENOUS
  Filled 2020-03-20: qty 2

## 2020-03-20 MED ORDER — KCL IN DEXTROSE-NACL 20-5-0.45 MEQ/L-%-% IV SOLN
INTRAVENOUS | Status: DC
  Filled 2020-03-20 (×2): qty 1000

## 2020-03-20 MED ORDER — SCOPOLAMINE 1 MG/3DAYS TD PT72
1.0000 | MEDICATED_PATCH | TRANSDERMAL | Status: DC
  Administered 2020-03-20: 1.5 mg via TRANSDERMAL
  Filled 2020-03-20: qty 1

## 2020-03-20 MED ORDER — MIDAZOLAM HCL 5 MG/5ML IJ SOLN
INTRAMUSCULAR | Status: DC | PRN
  Administered 2020-03-20: 2 mg via INTRAVENOUS

## 2020-03-20 MED ORDER — PHENYLEPHRINE 40 MCG/ML (10ML) SYRINGE FOR IV PUSH (FOR BLOOD PRESSURE SUPPORT)
PREFILLED_SYRINGE | INTRAVENOUS | Status: DC | PRN
  Administered 2020-03-20 (×3): 80 ug via INTRAVENOUS

## 2020-03-20 MED ORDER — KETOROLAC TROMETHAMINE 15 MG/ML IJ SOLN
15.0000 mg | INTRAMUSCULAR | Status: AC
  Administered 2020-03-20: 15 mg via INTRAVENOUS
  Filled 2020-03-20: qty 1

## 2020-03-20 MED ORDER — DEXAMETHASONE SODIUM PHOSPHATE 10 MG/ML IJ SOLN
INTRAMUSCULAR | Status: DC | PRN
  Administered 2020-03-20: 5 mg via INTRAVENOUS

## 2020-03-20 MED ORDER — LACTATED RINGERS IV SOLN: INTRAVENOUS | Status: DC

## 2020-03-20 MED ORDER — FENTANYL CITRATE (PF) 250 MCG/5ML IJ SOLN
INTRAMUSCULAR | Status: AC
  Filled 2020-03-20: qty 5

## 2020-03-20 MED ORDER — LACTATED RINGERS IR SOLN
Status: DC | PRN
  Administered 2020-03-20: 1000 mL

## 2020-03-20 MED ORDER — BUPIVACAINE LIPOSOME 1.3 % IJ SUSP
20.0000 mL | Freq: Once | INTRAMUSCULAR | Status: AC
  Administered 2020-03-20: 20 mL
  Filled 2020-03-20: qty 20

## 2020-03-20 MED ORDER — PANTOPRAZOLE SODIUM 40 MG IV SOLR
40.0000 mg | Freq: Every day | INTRAVENOUS | Status: DC
  Administered 2020-03-20: 40 mg via INTRAVENOUS
  Filled 2020-03-20: qty 40

## 2020-03-20 MED ORDER — IOHEXOL 300 MG/ML  SOLN
INTRAMUSCULAR | Status: DC | PRN
  Administered 2020-03-20: 4 mL

## 2020-03-20 MED ORDER — AMISULPRIDE (ANTIEMETIC) 5 MG/2ML IV SOLN: 10.0000 mg | Freq: Once | INTRAVENOUS | Status: DC | PRN

## 2020-03-20 MED ORDER — PROPOFOL 10 MG/ML IV BOLUS
INTRAVENOUS | Status: DC | PRN
  Administered 2020-03-20: 200 mg via INTRAVENOUS

## 2020-03-20 MED ORDER — PHENYLEPHRINE 40 MCG/ML (10ML) SYRINGE FOR IV PUSH (FOR BLOOD PRESSURE SUPPORT)
PREFILLED_SYRINGE | INTRAVENOUS | Status: AC
  Filled 2020-03-20: qty 10

## 2020-03-20 MED ORDER — ONDANSETRON HCL 4 MG/2ML IJ SOLN
INTRAMUSCULAR | Status: DC | PRN
  Administered 2020-03-20: 4 mg via INTRAVENOUS

## 2020-03-20 MED ORDER — PROPOFOL 10 MG/ML IV BOLUS
INTRAVENOUS | Status: AC
  Filled 2020-03-20: qty 20

## 2020-03-20 MED ORDER — METOPROLOL TARTRATE 5 MG/5ML IV SOLN: 5.0000 mg | Freq: Four times a day (QID) | INTRAVENOUS | Status: DC | PRN

## 2020-03-20 MED ORDER — MORPHINE SULFATE (PF) 2 MG/ML IV SOLN
1.0000 mg | INTRAVENOUS | Status: DC | PRN
  Administered 2020-03-20 – 2020-03-21 (×6): 1 mg via INTRAVENOUS
  Filled 2020-03-20 (×6): qty 1

## 2020-03-20 MED ORDER — ONDANSETRON HCL 4 MG/2ML IJ SOLN
INTRAMUSCULAR | Status: AC
  Filled 2020-03-20: qty 2

## 2020-03-20 MED ORDER — FENTANYL CITRATE (PF) 250 MCG/5ML IJ SOLN
INTRAMUSCULAR | Status: DC | PRN
  Administered 2020-03-20 (×2): 25 ug via INTRAVENOUS
  Administered 2020-03-20: 100 ug via INTRAVENOUS
  Administered 2020-03-20 (×2): 50 ug via INTRAVENOUS

## 2020-03-20 MED ORDER — FENTANYL CITRATE (PF) 100 MCG/2ML IJ SOLN
25.0000 ug | INTRAMUSCULAR | Status: DC | PRN
  Administered 2020-03-20: 50 ug via INTRAVENOUS

## 2020-03-20 SURGICAL SUPPLY — 46 items
ADH SKN CLS APL DERMABOND .7 (GAUZE/BANDAGES/DRESSINGS) ×1
APL SKNCLS STERI-STRIP NONHPOA (GAUZE/BANDAGES/DRESSINGS)
APL SWBSTK 6 STRL LF DISP (MISCELLANEOUS) ×2
APPLICATOR COTTON TIP 6 STRL (MISCELLANEOUS) ×2 IMPLANT
APPLICATOR COTTON TIP 6IN STRL (MISCELLANEOUS) ×4 IMPLANT
APPLIER CLIP 5 13 M/L LIGAMAX5 (MISCELLANEOUS)
APPLIER CLIP ROT 10 11.4 M/L (STAPLE) ×2
APR CLP MED LRG 11.4X10 (STAPLE) ×1
APR CLP MED LRG 5 ANG JAW (MISCELLANEOUS)
BAG SPEC RTRVL 10 TROC 200 (ENDOMECHANICALS) ×1
BENZOIN TINCTURE PRP APPL 2/3 (GAUZE/BANDAGES/DRESSINGS) IMPLANT
CABLE HIGH FREQUENCY MONO STRZ (ELECTRODE) IMPLANT
CATH REDDICK CHOLANGI 4FR 50CM (CATHETERS) ×2 IMPLANT
CLIP APPLIE 5 13 M/L LIGAMAX5 (MISCELLANEOUS) IMPLANT
CLIP APPLIE ROT 10 11.4 M/L (STAPLE) IMPLANT
COVER MAYO STAND STRL (DRAPES) ×2 IMPLANT
COVER SURGICAL LIGHT HANDLE (MISCELLANEOUS) ×2 IMPLANT
COVER WAND RF STERILE (DRAPES) IMPLANT
DECANTER SPIKE VIAL GLASS SM (MISCELLANEOUS) ×2 IMPLANT
DERMABOND ADVANCED (GAUZE/BANDAGES/DRESSINGS) ×1
DERMABOND ADVANCED .7 DNX12 (GAUZE/BANDAGES/DRESSINGS) ×1 IMPLANT
DRAPE C-ARM 42X120 X-RAY (DRAPES) ×2 IMPLANT
ELECT L-HOOK LAP 45CM DISP (ELECTROSURGICAL)
ELECT PENCIL ROCKER SW 15FT (MISCELLANEOUS) IMPLANT
ELECT REM PT RETURN 15FT ADLT (MISCELLANEOUS) ×2 IMPLANT
ELECTRODE L-HOOK LAP 45CM DISP (ELECTROSURGICAL) IMPLANT
GLOVE BIOGEL M 8.0 STRL (GLOVE) ×2 IMPLANT
GOWN STRL REUS W/TWL XL LVL3 (GOWN DISPOSABLE) ×2 IMPLANT
HEMOSTAT SURGICEL 4X8 (HEMOSTASIS) IMPLANT
IV CATH 14GX2 1/4 (CATHETERS) ×2 IMPLANT
KIT BASIN OR (CUSTOM PROCEDURE TRAY) ×2 IMPLANT
KIT TURNOVER KIT A (KITS) IMPLANT
POUCH RETRIEVAL ECOSAC 10 (ENDOMECHANICALS) IMPLANT
POUCH RETRIEVAL ECOSAC 10MM (ENDOMECHANICALS) ×2
SCISSORS LAP 5X45 EPIX DISP (ENDOMECHANICALS) ×2 IMPLANT
SET IRRIG TUBING LAPAROSCOPIC (IRRIGATION / IRRIGATOR) ×2 IMPLANT
SET TUBE SMOKE EVAC HIGH FLOW (TUBING) ×2 IMPLANT
SLEEVE XCEL OPT CAN 5 100 (ENDOMECHANICALS) ×2 IMPLANT
STRIP CLOSURE SKIN 1/2X4 (GAUZE/BANDAGES/DRESSINGS) IMPLANT
SUT MNCRL AB 4-0 PS2 18 (SUTURE) ×4 IMPLANT
SYR 20ML LL LF (SYRINGE) ×2 IMPLANT
TOWEL OR 17X26 10 PK STRL BLUE (TOWEL DISPOSABLE) ×2 IMPLANT
TRAY LAPAROSCOPIC (CUSTOM PROCEDURE TRAY) ×2 IMPLANT
TROCAR BLADELESS OPT 5 100 (ENDOMECHANICALS) ×2 IMPLANT
TROCAR XCEL BLUNT TIP 100MML (ENDOMECHANICALS) IMPLANT
TROCAR XCEL NON-BLD 11X100MML (ENDOMECHANICALS) ×1 IMPLANT

## 2020-03-20 NOTE — Anesthesia Procedure Notes (Signed)
Procedure Name: Intubation Date/Time: 03/20/2020 10:11 AM Performed by: Talbot Grumbling, CRNA Pre-anesthesia Checklist: Patient identified, Emergency Drugs available, Suction available and Patient being monitored Patient Re-evaluated:Patient Re-evaluated prior to induction Oxygen Delivery Method: Circle system utilized Preoxygenation: Pre-oxygenation with 100% oxygen Induction Type: IV induction Ventilation: Mask ventilation without difficulty Laryngoscope Size: Mac and 3 Grade View: Grade I Tube type: Oral Tube size: 7.5 mm Number of attempts: 1 Airway Equipment and Method: Stylet Placement Confirmation: ETT inserted through vocal cords under direct vision,  breath sounds checked- equal and bilateral and positive ETCO2 Secured at: 22 cm Tube secured with: Tape Dental Injury: Teeth and Oropharynx as per pre-operative assessment

## 2020-03-20 NOTE — Progress Notes (Addendum)
PROGRESS NOTE    Norma [name]  OVZ:[id number] DOB: 01-21-1961 DOA: 03/16/2020 PCP: [name], NP    Chief Complaint  Patient presents with  . Abdominal Pain    Brief Narrative:  HPI per Dr. Bing Norma [name] is a 59 y.o. female with medical history significant of type 2 diabetes not on meds, hypertension, depression, anxiety, GERD, chronic pain presenting with complaints of abdominal pain.  Patient stated he normally never gets abdominal pain after eating.  However, Friday night after eating dinner she experienced cramping generalized abdominal pain.  Today she is feeling pain mostly in her right upper quadrant.  She describes it as a cramping sensation that is constant.  She has felt nauseous but has not vomited.  Denies history of any gallbladder problems.  Denies fevers or chills.  She has been vaccinated against Covid.  Denies shortness of breath, cough, and chest pain. 03/20/2020-patient is status post laparoscopic cholecystectomy. Please see op note by Dr. Johnathan [name].   ED Course: Blood pressure significantly elevated with systolic above 412, remainder of vital signs stable.  WBC 7.8, hemoglobin 13.4, hematocrit 42.0, platelet 211K.  Sodium 138, potassium 3.6, chloride 104, bicarb 25, BUN 12, creatinine 0.6, glucose 111.  Lipase and LFTs normal.  UA with negative nitrite, trace amount of leukocytes, 6-10 WBCs, and no bacteria.  Screening SARS-CoV-2 PCR test and influenza panel pending.  CT abdomen pelvis showing findings concerning for acalculous acute cholecystitis.  Stable left adnexal cyst, likely ovarian in origin.  Further correlation with pelvic ultrasound recommended.  Right upper quadrant abdominal ultrasound showing cholelithiasis and gallbladder sludge with no findings to suggest acute cholecystitis.  CBD diameter at the upper limit of normal/borderline enlarged at 8 mm with choledocholithiasis more distally not excluded.  Also showing evidence of hepatic  steatosis.  Patient was given fentanyl, Dilaudid, morphine, Zofran, Zosyn, and ceftriaxone.  ED provider discussed the case with on-call physician for [facility] who recommended obtaining a HIDA scan and if there is concern for cholecystitis/biliary colic then consult general surgery in the morning.  ERCP not recommended as LFTs are normal.  GI will see the patient in the morning.  Assessment & Plan:   Principal Problem:   Cholelithiasis Active Problems:   Depression   Abdominal pain   Hypertensive urgency   Type 2 diabetes mellitus (HCC)   RUQ pain   Biliary colic symptom   Elevated liver enzymes   Abnormal findings on diagnostic imaging of liver and biliary tract   HCV antibody positive  1 right upper quadrant abdominal pain/cholelithiasis/possible acute cholecystitis Patient denies any further abdominal pain today 03/19/2020. On admission lipase levels within normal limits.  LFTs were within normal limits on admission however rising.  AST 322 from 350, ALT of 227 from 146.  Bilirubin elevated at 6.9 today from 3.0 yesterday.  CT abdomen and pelvis done on admission with findings consistent with a calculus acute cholecystitis, stable left adnexal cyst likely ovarian in origin.  Right upper quadrant abdominal ultrasound done with cholelithiasis and gallbladder sludge with no findings to suggest acute cholecystitis.  Common bile duct measures upper limits of normal 8 mm with choledocholithiasis more distally not excluded, hepatic steatosis.  HIDA scan obtained today with imaging findings consistent with either common bile duct obstruction or hepatocellular dysfunction secondary to hepatitis.  Acute hepatitis panel with hepatitis C antibody reactive.  Check a hepatitis C RNA.  MRCP which was done positive for cholelithiasis with some subtle changes which could  be indicative of early acute cholecystitis, including mild gallbladder wall thickening, trace amount of pericholecystic fluid, mildly  dilated common bile duct at 10 mm in the porta hepatis, there is no choledocholithiasis, no intrahepatic biliary ductal dilatation to suggest frank biliary tract obstruction, hepatic steatosis.  Patient initially started on a clear liquid diet this morning and has been advanced to a full liquid diet.  Continue IV Zosyn.  GI following and planning for ERCP tomorrow 03/20/2020.  General surgery following and patient likely will require cholecystectomy post ERCP.  GI recommended for surgery to proceed with cholecystectomy. Patient is status post lap chole with findings of cirrhotic liver. See op note by Dr. Ronald [name]. Discharge planning pending general surgery clearance.   3.  Diet-controlled diabetes mellitus type 2 versus prediabetes Not on any medications prior to admission.  Hemoglobin A1c 6.1.  Continue with fingersticks before meals and at bedtime Maintain hypoglycemic protocol and sliding scale insulin  4.  Hypertension/hypertensive urgency Controlled on home regimen of HCTZ, Cozaar.  Hydralazine as needed.   5.  Abnormal urinalysis Urine culture showed mixed flora with no identifiable pathogen. Monitor off antibiotics.    6.  Left adnexal cyst Noted on CT abdomen and pelvis.  It was noted per admitting physician that patient was seen by OB/GYN in September 2020 for an endometrial mass, biopsies revealed atrophic endometrium with benign endometrial polyp, no atypia or malignancy.  Outpatient follow-up with OB/GYN.  7.  Depression/anxiety Continue home regimen Wellbutrin, trazodone.  Outpatient follow-up.    DVT prophylaxis: SCDs Code Status: Full Family Communication: No family member at bedside. Disposition: Possible discharge home with surgery clearance.     Status is: Patient qualifies for inpatient    Dispo: The patient is from: Home              Anticipated d/c is to: Home              Anticipated d/c date is: 1 to 2 days Patient is status post laparoscopic  cholecystectomy and discharge planning pending clearance by general surgery. .                Consultants:   Gastroenterology: Dr. Hilarie Fredrickson 03/17/2020  General surgery: Dr. Hassell Done 03/17/2020  Procedures:   CT abdomen and pelvis 03/16/2020  Right upper quadrant ultrasound 03/16/2020  HIDA scan 03/17/2020  MRCP 03/17/2020  Laparoscopic cholecystectomy 03/20/2020  Antimicrobials:   IV Zosyn 03/17/2020>>>>   Subjective: Patient is seen and evaluated at bedside.  She is status post lap chole with mild abdominal tenderness.  Denies any unusual pain, nausea or vomiting.  Continue with pain control and further management as directed by general surgery.  Objective: Vitals:   03/20/20 1245 03/20/20 1300 03/20/20 1315 03/20/20 1426  BP: 109/66 117/72 119/76 135/76  Pulse: 70 68 72 73  Resp: 19 13 13 18   Temp:   97.7 F (36.5 C) (!) 97.5 F (36.4 C)  TempSrc:    Oral  SpO2: 100% 99% 98% 97%  Weight:      Height:        Intake/Output Summary (Last 24 hours) at 03/20/2020 1728 Last data filed at 03/20/2020 1216 Gross per 24 hour  Intake 1724 ml  Output 20 ml  Net 1704 ml   Filed Weights   03/17/20 0544  Weight: 114 kg    Examination:  General exam: She is alert and not in acute distress.  Complaining of mild abdominal tenderness.  She is status post lap chole  Respiratory system: Lungs clear to auscultation bilaterally.  No wheezes, no crackles, no rhonchi.  Normal respiratory effort.  Speaking in full sentences.   Cardiovascular system: Regular rate rhythm no murmurs rubs or gallops.  No JVD.  No lower extremity edema.  Gastrointestinal system: Abdomen is soft, nondistended, decreased tenderness to palpation right upper quadrant.  Positive bowel sounds.  No rebound.  No guarding.  Central nervous system: Alert and oriented. No focal neurological deficits. Extremities: Symmetric 5 x 5 power. Skin: No rashes, lesions or ulcers Psychiatry: Judgement and insight appear  normal. Mood & affect appropriate.     Data Reviewed: I have personally reviewed following labs and imaging studies  CBC: Recent Labs  Lab 03/16/20 1648 03/17/20 0848 03/18/20 0435 03/19/20 0753 03/20/20 0401  WBC 7.8 9.1 6.3 4.9 6.9  NEUTROABS  --  6.8  --  2.6  --   HGB 13.4 12.4 12.5 12.8 12.9  HCT 42.0 39.6 39.6 41.0 40.5  MCV 87.3 89.0 87.8 89.1 87.9  PLT 211 197 181 201 983    Basic Metabolic Panel: Recent Labs  Lab 03/16/20 1648 03/17/20 0848 03/18/20 0435 03/19/20 0753 03/20/20 0401  NA 138 139 135 137 137  K 3.6 4.0 3.9 3.7 3.5  CL 104 103 101 102 103  CO2 25 25 24 26 24   GLUCOSE 111* 134* 103* 101* 118*  BUN 12 12 10 9 10   CREATININE 0.60 0.96 0.76 0.95 0.92  CALCIUM 9.4 8.6* 9.0 9.3 9.3  MG  --  1.9 2.0 2.0 2.0  PHOS  --   --   --   --  3.4    GFR: Estimated Creatinine Clearance: 85.1 mL/min (by C-G formula based on SCr of 0.92 mg/dL).  Liver Function Tests: Recent Labs  Lab 03/16/20 1648 03/17/20 0848 03/18/20 0435 03/19/20 0753 03/20/20 0401  AST 31 350* 322* 133* 70*  ALT 22 146* 227* 173* 125*  ALKPHOS 63 109 112 107 95  BILITOT 0.8 3.0* 6.9* 2.2* 1.1  PROT 8.0 7.5 7.3 7.6 7.6  ALBUMIN 4.2 3.5 3.6 3.8 3.6    CBG: Recent Labs  Lab 03/19/20 1145 03/19/20 1651 03/19/20 2106 03/20/20 0753 03/20/20 1701  GLUCAP 155* 105* 125* 105* 173*     Recent Results (from the past 240 hour(s))  Resp Panel by RT-PCR (Flu A&B, Covid) Nasopharyngeal Swab     Status: None   Collection Time: 03/16/20  8:21 PM   Specimen: Nasopharyngeal Swab; Nasopharyngeal(NP) swabs in vial transport medium  Result Value Ref Range Status   SARS Coronavirus 2 by RT PCR NEGATIVE NEGATIVE Final    Comment: (NOTE) SARS-CoV-2 target nucleic acids are NOT DETECTED.  The SARS-CoV-2 RNA is generally detectable in upper respiratory specimens during the acute phase of infection. The lowest concentration of SARS-CoV-2 viral copies this assay can detect is 138  copies/mL. A negative result does not preclude SARS-Cov-2 infection and should not be used as the sole basis for treatment or other patient management decisions. A negative result may occur with  improper specimen collection/handling, submission of specimen other than nasopharyngeal swab, presence of viral mutation(s) within the areas targeted by this assay, and inadequate number of viral copies(<138 copies/mL). A negative result must be combined with clinical observations, patient history, and epidemiological information. The expected result is Negative.  Fact Sheet for Patients:  EntrepreneurPulse.com.au  Fact Sheet for Healthcare Providers:  IncredibleEmployment.be  This test is no t yet approved or cleared by the [place] and  has been authorized for detection and/or diagnosis of SARS-CoV-2 by FDA under an Emergency Use Authorization (EUA). This EUA will remain  in effect (meaning this test can be used) for the duration of the COVID-19 declaration under Section 564(b)(1) of the Act, 21 U.S.C.section 360bbb-3(b)(1), unless the authorization is terminated  or revoked sooner.       Influenza A by PCR NEGATIVE NEGATIVE Final   Influenza B by PCR NEGATIVE NEGATIVE Final    Comment: (NOTE) The Xpert Xpress SARS-CoV-2/FLU/RSV plus assay is intended as an aid in the diagnosis of influenza from Nasopharyngeal swab specimens and should not be used as a sole basis for treatment. Nasal washings and aspirates are unacceptable for Xpert Xpress SARS-CoV-2/FLU/RSV testing.  Fact Sheet for Patients: EntrepreneurPulse.com.au  Fact Sheet for Healthcare Providers: IncredibleEmployment.be  This test is not yet approved or cleared by the [place] FDA and has been authorized for detection and/or diagnosis of SARS-CoV-2 by FDA under an Emergency Use Authorization (EUA). This EUA will remain in effect (meaning  this test can be used) for the duration of the COVID-19 declaration under Section 564(b)(1) of the Act, 21 U.S.C. section 360bbb-3(b)(1), unless the authorization is terminated or revoked.  Performed at [facility], [place] [address], [place], [place] [address]   Culture, Urine     Status: Abnormal   Collection Time: 03/16/20 10:08 PM   Specimen: Urine, Clean Catch  Result Value Ref Range Status   Specimen Description   Final    URINE, CLEAN CATCH Performed at [facility], [place] [address], [place], [place] [address]    Special Requests   Final    NONE Performed at [facility], [place] [address] [place], [place] [address]    Culture MULTIPLE SPECIES PRESENT, SUGGEST RECOLLECTION (A)  Final   Report Status 03/18/2020 FINAL  Final         Radiology Studies: DG Cholangiogram Operative  Result Date: 03/20/2020 CLINICAL DATA:  Laparoscopic cholecystectomy EXAM: INTRAOPERATIVE CHOLANGIOGRAM TECHNIQUE: Cholangiographic images from the C-arm fluoroscopic device were submitted for interpretation post-operatively. Please see the procedural report for the amount of contrast and the fluoroscopy time utilized. COMPARISON:  03/16/2020 FINDINGS: Intraoperative cholangiogram performed during laparoscopic procedure. The biliary confluence, common hepatic duct, and common bile duct appear patent. Overlying artifact noted. No large filling defect, stricture, or obstruction pattern. Contrast easily drains into the duodenum. IMPRESSION: Patent biliary system. Electronically Signed   By: Jerilynn Mages.  Shick M.D.   On: 03/20/2020 11:33        Scheduled Meds: . pantoprazole (PROTONIX) IV  40 mg Intravenous QHS   Continuous Infusions: . dextrose 5 % and 0.45 % NaCl with KCl 20 mEq/L 75 mL/hr at 03/20/20 1528     LOS: 4 days    Time spent: 35 minutes    Elie Confer, MD [facility] Pager: [phone]    03/20/2020, 5:28 PM

## 2020-03-20 NOTE — Plan of Care (Signed)
POC discussed with pt, pt alert and oriented x4, shows no signs of distress, VSS.  Pain managed with prn pain medications (see MAR). Bed wheels locked, nonskid footwear applied OOB, call bell within reach, encouraged to use call bell for assistance, no  falls during shift  Problem: Education: Goal: Knowledge of General Education information will improve Description: Including pain rating scale, medication(s)/side effects and non-pharmacologic comfort measures Outcome: Progressing   Problem: Health Behavior/Discharge Planning: Goal: Ability to manage health-related needs will improve Outcome: Progressing   Problem: Clinical Measurements: Goal: Ability to maintain clinical measurements within normal limits will improve Outcome: Progressing Goal: Will remain free from infection Outcome: Progressing Goal: Diagnostic test results will improve Outcome: Progressing Goal: Respiratory complications will improve Outcome: Progressing Goal: Cardiovascular complication will be avoided Outcome: Progressing   Problem: Activity: Goal: Risk for activity intolerance will decrease Outcome: Progressing   Problem: Nutrition: Goal: Adequate nutrition will be maintained Outcome: Progressing   Problem: Coping: Goal: Level of anxiety will decrease Outcome: Progressing   Problem: Elimination: Goal: Will not experience complications related to bowel motility Outcome: Progressing Goal: Will not experience complications related to urinary retention Outcome: Progressing   Problem: Elimination: Goal: Will not experience complications related to bowel motility Outcome: Progressing Goal: Will not experience complications related to urinary retention Outcome: Progressing   Problem: Pain Managment: Goal: General experience of comfort will improve Outcome: Progressing   Problem: Safety: Goal: Ability to remain free from injury will improve Outcome: Progressing   Problem: Skin Integrity: Goal:  Risk for impaired skin integrity will decrease Outcome: Progressing   Problem: Education: Goal: Ability to describe self-care measures that may prevent or decrease complications (Diabetes Survival Skills Education) will improve Outcome: Progressing Goal: Individualized Educational Video(s) Outcome: Progressing   Problem: Coping: Goal: Ability to adjust to condition or change in health will improve Outcome: Progressing   Problem: Fluid Volume: Goal: Ability to maintain a balanced intake and output will improve Outcome: Progressing   Problem: Health Behavior/Discharge Planning: Goal: Ability to identify and utilize available resources and services will improve Outcome: Progressing Goal: Ability to manage health-related needs will improve Outcome: Progressing   Problem: Metabolic: Goal: Ability to maintain appropriate glucose levels will improve Outcome: Progressing   Problem: Nutritional: Goal: Maintenance of adequate nutrition will improve Outcome: Progressing Goal: Progress toward achieving an optimal weight will improve Outcome: Progressing

## 2020-03-20 NOTE — Telephone Encounter (Signed)
The pt has been scheduled for an appt on 1/27/22at 830 am with Dr Silverio Decamp.  The pt will be advised at discharge

## 2020-03-20 NOTE — Progress Notes (Signed)
CC: Abdominal pain  Subjective: Patient is feeling much better this a.m. no further pain.  Objective: Vital signs in last 24 hours: Temp:  [97.5 F (36.4 C)-98.1 F (36.7 C)] 98.1 F (36.7 C) (12/01 2025) Pulse Rate:  [73-87] 87 (12/01 2025) Resp:  [17] 17 (12/01 2025) BP: (106-117)/(67-78) 117/78 (12/01 2025) SpO2:  [96 %-97 %] 97 % (12/01 2025) Last BM Date: 03/15/20 828 p.o. Voided x4 No emesis or stool recorded. Afebrile vital signs are stable. CMP shows marked improvement in her LFTs.  AST 70, ALT 125, total bilirubin 1.1 WBC 6.9    Intake/Output from previous day: 12/01 0701 - 12/02 0700 In: 828 [P.O.:828] Out: -  Intake/Output this shift: No intake/output data recorded.  General appearance: alert, cooperative and no distress Resp: clear to auscultation bilaterally GI: soft, non-tender; bowel sounds normal; no masses,  no organomegaly  Lab Results:  Recent Labs    03/19/20 0753 03/20/20 0401  WBC 4.9 6.9  HGB 12.8 12.9  HCT 41.0 40.5  PLT 201 206    BMET Recent Labs    03/19/20 0753 03/20/20 0401  NA 137 137  K 3.7 3.5  CL 102 103  CO2 26 24  GLUCOSE 101* 118*  BUN 9 10  CREATININE 0.95 0.92  CALCIUM 9.3 9.3   PT/INR Recent Labs    03/18/20 0435  LABPROT 14.3  INR 1.2    Recent Labs  Lab 03/16/20 1648 03/17/20 0848 03/18/20 0435 03/19/20 0753 03/20/20 0401  AST 31 350* 322* 133* 70*  ALT 22 146* 227* 173* 125*  ALKPHOS 63 109 112 107 95  BILITOT 0.8 3.0* 6.9* 2.2* 1.1  PROT 8.0 7.5 7.3 7.6 7.6  ALBUMIN 4.2 3.5 3.6 3.8 3.6     Lipase     Component Value Date/Time   LIPASE 32 03/17/2020 0848     Medications: . buPROPion  300 mg Oral Daily  . fluticasone  1 spray Each Nare Daily  . gabapentin  100 mg Oral QHS  . hydrochlorothiazide  25 mg Oral Daily  . insulin aspart  0-9 Units Subcutaneous TID WC  . ketorolac  30 mg Intravenous Once  . loratadine  10 mg Oral Daily  . losartan  50 mg Oral Daily  . metoprolol  tartrate  25 mg Oral BID  . multivitamin-iron-minerals-folic acid  1 tablet Oral Daily  . pantoprazole  80 mg Oral Daily  . potassium chloride  20 mEq Oral Daily  . traZODone  100 mg Oral QHS  . Vitamin D (Ergocalciferol)  50,000 Units Oral Q7 days    Assessment/Plan Hx chronic pain GERD Type 2 diabetes-currently on no metformin Hypertension Hx lumbar laminectomy/tubal ligation Atrial fibrillation - on lopressor  Hepatitis C - viral load pending, acute vs chronic  RUQpain with cholelithiasis/possible cholecystitis - CT, [place], and MCRP suggestive of possible cholecystitis but not definite.  No pain, no fever, no WBC -TB down to 2.2, AST/ALT down.  Given this it is likely that these were elevated from a CBD stone and she has passed it. - ultimately patient may need her gallbladder out,will D/W MD  - Total bilirubin 0.8(11/28)>>3.0>>6.9(11/30)>>2.2>>1.1(12/2)                AST 31>>350>>322>>133>>70                ALT 22>>146>>227>>173>>125 - EUS/ERCP scheduled for 03/20/20 @ 1530  FEN:NPO for EUS/ERCP today OF:BPZWCHEN times 111/28/21; Zosyn 11/28>> day3 DVT: SCD, ok to have chemical  DVT prophylaxis from a general surgery standpoint  Plan: It is Dr. Earlie Server opinion she is passed a stone.  We have recommended going forward with laparoscopic cholecystectomy with intraoperative cholangiogram today.  The risk benefits were discussed with the patient in detail and she is agreeable to this procedure.  Ongoing to go ahead and posterior and I will review with GI also.  LOS: 4 days    Norma [name] 03/20/2020 Please see Amion

## 2020-03-20 NOTE — Anesthesia Preprocedure Evaluation (Signed)
Anesthesia Evaluation  Patient identified by MRN, date of birth, ID band Patient awake    Reviewed: Allergy & Precautions, NPO status , Patient's Chart, lab work & pertinent test results  Airway Mallampati: II  TM Distance: >3 FB Neck ROM: Full    Dental  (+) Dental Advisory Given   Pulmonary    breath sounds clear to auscultation       Cardiovascular hypertension, Pt. on medications and Pt. on home beta blockers + dysrhythmias Atrial Fibrillation  Rhythm:Regular Rate:Normal     Neuro/Psych  Headaches,    GI/Hepatic Neg liver ROS, GERD  ,  Endo/Other  diabetes, Type 2, Oral Hypoglycemic Agents  Renal/GU negative Renal ROS     Musculoskeletal  (+) Arthritis ,   Abdominal   Peds  Hematology negative hematology ROS (+)   Anesthesia Other Findings   Reproductive/Obstetrics                             Lab Results  Component Value Date   WBC 6.9 03/20/2020   HGB 12.9 03/20/2020   HCT 40.5 03/20/2020   MCV 87.9 03/20/2020   PLT 206 03/20/2020   Lab Results  Component Value Date   CREATININE 0.92 03/20/2020   BUN 10 03/20/2020   NA 137 03/20/2020   K 3.5 03/20/2020   CL 103 03/20/2020   CO2 24 03/20/2020    Anesthesia Physical Anesthesia Plan  ASA: II  Anesthesia Plan: General   Post-op Pain Management:    Induction: Intravenous  PONV Risk Score and Plan: 3 and Midazolam, Dexamethasone, Ondansetron and Treatment may vary due to age or medical condition  Airway Management Planned: Oral ETT  Additional Equipment: None  Intra-op Plan:   Post-operative Plan: Extubation in OR  Informed Consent: I have reviewed the patients History and Physical, chart, labs and discussed the procedure including the risks, benefits and alternatives for the proposed anesthesia with the patient or authorized representative who has indicated his/her understanding and acceptance.     Dental  advisory given  Plan Discussed with: CRNA  Anesthesia Plan Comments:         Anesthesia Quick Evaluation

## 2020-03-20 NOTE — Op Note (Addendum)
Norma [name]  Primary Care Physician:  [name], NP    03/20/2020  11:54 AM  Procedure: Laparoscopic Cholecystectomy with intraoperative cholangiogram; wedge liver biopsy of right lobe       Surgeon: Catalina Antigua B. Hassell Done, MD, FACS Asst:  none  Anes:  General  Drains:  None  Findings: Cirrhosis (biopsied);  Cholecystitis; normal IOC  Description of Procedure: The patient was taken to OR 4 and given general anesthesia.  The patient was prepped with chlorohexidine prep and draped sterilely. A time out was performed including identifying the patient and discussing their procedure.  Access to the abdomen was achieved with a 5 mm Optivew through the right upper quadrant.  Port placement included three 5 mm and one 12 obliquely through the upper midline.    The gallbladder was visualized and appeared green and surrounded by omentum.   The fundus of the gallbaldder was grasped and the gallbladder was elevated. Traction on the infundibulum allowed for successful demonstration of the critical view. Inflammatory changes were acute and chronic and the liver was cirrhotic.  The cystic duct was identified and clipped up on the gallbladder and an incision was made in the cystic duct and the Reddick catheter was inserted after milking the cystic duct of any debris. A dynamic cholangiogram was performed which demonstrated intrahepatic filling and flow into the duodenum.    The cystic duct was then triple clipped and divided, the cystic artery was double clipped and divided and then the gallbladder was removed from the gallbladder bed. Removal of the gallbladder from the gallbladder bed was performed without stone spillage.  The gallbladder was then placed in a bag and brought out through one of the trocar sites. The gallbladder bed was inspected and no bleeding or bile leaks were seen.  Incisions were injected with Exparel and closed with 4-0 Monocryl and Dermabond on the skin.  Sponge and needle count  were correct.    The patient was taken to the recovery room in satisfactory condition.

## 2020-03-20 NOTE — Telephone Encounter (Signed)
-----   Message from Levin Erp, [place] sent at 03/20/2020  2:09 PM EST ----- Regarding: follow up Patient needs follow up with Nevin Bloodgood or Dr. Silverio Decamp in 6-8 weeks.  Thanks-JLL

## 2020-03-20 NOTE — Interval H&P Note (Signed)
History and Physical Interval Note:  34/04/9377 0:24 AM  Norma [name]  has presented today for surgery, with the diagnosis of cholelithiasis.  The various methods of treatment have been discussed with the patient and family. After consideration of risks, benefits and other options for treatment, the patient has consented to  Procedure(s): LAPAROSCOPIC CHOLECYSTECTOMY WITH INTRAOPERATIVE CHOLANGIOGRAM (N/A) as a surgical intervention.  The patient's history has been reviewed, patient examined, no change in status, stable for surgery.  I have reviewed the patient's chart and labs.  Questions were answered to the patient's satisfaction.     Pedro Earls

## 2020-03-20 NOTE — H&P (View-Only) (Signed)
CC: Abdominal pain  Subjective: Patient is feeling much better this a.m. no further pain.  Objective: Vital signs in last 24 hours: Temp:  [97.5 F (36.4 C)-98.1 F (36.7 C)] 98.1 F (36.7 C) (12/01 2025) Pulse Rate:  [73-87] 87 (12/01 2025) Resp:  [17] 17 (12/01 2025) BP: (106-117)/(67-78) 117/78 (12/01 2025) SpO2:  [96 %-97 %] 97 % (12/01 2025) Last BM Date: 03/15/20 828 p.o. Voided x4 No emesis or stool recorded. Afebrile vital signs are stable. CMP shows marked improvement in her LFTs.  AST 70, ALT 125, total bilirubin 1.1 WBC 6.9    Intake/Output from previous day: 12/01 0701 - 12/02 0700 In: 828 [P.O.:828] Out: -  Intake/Output this shift: No intake/output data recorded.  General appearance: alert, cooperative and no distress Resp: clear to auscultation bilaterally GI: soft, non-tender; bowel sounds normal; no masses,  no organomegaly  Lab Results:  Recent Labs    03/19/20 0753 03/20/20 0401  WBC 4.9 6.9  HGB 12.8 12.9  HCT 41.0 40.5  PLT 201 206    BMET Recent Labs    03/19/20 0753 03/20/20 0401  NA 137 137  K 3.7 3.5  CL 102 103  CO2 26 24  GLUCOSE 101* 118*  BUN 9 10  CREATININE 0.95 0.92  CALCIUM 9.3 9.3   PT/INR Recent Labs    03/18/20 0435  LABPROT 14.3  INR 1.2    Recent Labs  Lab 03/16/20 1648 03/17/20 0848 03/18/20 0435 03/19/20 0753 03/20/20 0401  AST 31 350* 322* 133* 70*  ALT 22 146* 227* 173* 125*  ALKPHOS 63 109 112 107 95  BILITOT 0.8 3.0* 6.9* 2.2* 1.1  PROT 8.0 7.5 7.3 7.6 7.6  ALBUMIN 4.2 3.5 3.6 3.8 3.6     Lipase     Component Value Date/Time   LIPASE 32 03/17/2020 0848     Medications: . buPROPion  300 mg Oral Daily  . fluticasone  1 spray Each Nare Daily  . gabapentin  100 mg Oral QHS  . hydrochlorothiazide  25 mg Oral Daily  . insulin aspart  0-9 Units Subcutaneous TID WC  . ketorolac  30 mg Intravenous Once  . loratadine  10 mg Oral Daily  . losartan  50 mg Oral Daily  . metoprolol  tartrate  25 mg Oral BID  . multivitamin-iron-minerals-folic acid  1 tablet Oral Daily  . pantoprazole  80 mg Oral Daily  . potassium chloride  20 mEq Oral Daily  . traZODone  100 mg Oral QHS  . Vitamin D (Ergocalciferol)  50,000 Units Oral Q7 days    Assessment/Plan Hx chronic pain GERD Type 2 diabetes-currently on no metformin Hypertension Hx lumbar laminectomy/tubal ligation Atrial fibrillation - on lopressor  Hepatitis C - viral load pending, acute vs chronic  RUQpain with cholelithiasis/possible cholecystitis - CT, [place], and MCRP suggestive of possible cholecystitis but not definite.  No pain, no fever, no WBC -TB down to 2.2, AST/ALT down.  Given this it is likely that these were elevated from a CBD stone and she has passed it. - ultimately patient may need her gallbladder out,will D/W MD  - Total bilirubin 0.8(11/28)>>3.0>>6.9(11/30)>>2.2>>1.1(12/2)                AST 31>>350>>322>>133>>70                ALT 22>>146>>227>>173>>125 - EUS/ERCP scheduled for 03/20/20 @ 1530  FEN:NPO for EUS/ERCP today DV:VOHYWVPX times 111/28/21; Zosyn 11/28>> day3 DVT: SCD, ok to have chemical  DVT prophylaxis from a general surgery standpoint  Plan: It is Dr. Earlie Server opinion she is passed a stone.  We have recommended going forward with laparoscopic cholecystectomy with intraoperative cholangiogram today.  The risk benefits were discussed with the patient in detail and she is agreeable to this procedure.  Ongoing to go ahead and posterior and I will review with GI also.  LOS: 4 days    Norma [name] 03/20/2020 Please see Amion

## 2020-03-20 NOTE — Transfer of Care (Signed)
Immediate Anesthesia Transfer of Care Note  Patient: Norma [name]  Procedure(s) Performed: LAPAROSCOPIC CHOLECYSTECTOMY WITH INTRAOPERATIVE CHOLANGIOGRAM AND WEDGE LIVER BIOPSY (N/A Abdomen)  Patient Location: PACU  Anesthesia Type:General  Level of Consciousness: awake, alert  and patient cooperative  Airway & Oxygen Therapy: Patient Spontanous Breathing and Patient connected to face mask oxygen  Post-op Assessment: Report given to RN and Post -op Vital signs reviewed and stable  Post vital signs: Reviewed and stable  Last Vitals:  Vitals Value Taken Time  BP 110/70 03/20/20 1215  Temp    Pulse 70 03/20/20 1215  Resp 16 03/20/20 1215  SpO2 100 % 03/20/20 1215  Vitals shown include unvalidated device data.  Last Pain:  Vitals:   03/20/20 0914  TempSrc: Oral  PainSc: 7       Patients Stated Pain Goal: 2 (38/46/65 9935)  Complications: No complications documented.

## 2020-03-21 ENCOUNTER — Other Ambulatory Visit: Payer: Self-pay

## 2020-03-21 ENCOUNTER — Encounter (HOSPITAL_COMMUNITY): Payer: Self-pay | Admitting: Surgery

## 2020-03-21 DIAGNOSIS — K8001 Calculus of gallbladder with acute cholecystitis with obstruction: Secondary | ICD-10-CM | POA: Diagnosis not present

## 2020-03-21 LAB — COMPREHENSIVE METABOLIC PANEL
ALT: 110 U/L — ABNORMAL HIGH (ref 0–44)
AST: 77 U/L — ABNORMAL HIGH (ref 15–41)
Albumin: 3.9 g/dL (ref 3.5–5.0)
Alkaline Phosphatase: 82 U/L (ref 38–126)
Anion gap: 11 (ref 5–15)
BUN: 8 mg/dL (ref 8–23)
CO2: 24 mmol/L (ref 22–32)
Calcium: 8.8 mg/dL — ABNORMAL LOW (ref 8.9–10.3)
Chloride: 102 mmol/L (ref 98–111)
Creatinine, Ser: 0.83 mg/dL (ref 0.44–1.00)
GFR, Estimated: >60 mL/min (ref >60)
Glucose, Bld: 112 mg/dL — ABNORMAL HIGH (ref 70–99)
Potassium: 3.5 mmol/L (ref 3.5–5.1)
Sodium: 137 mmol/L (ref 135–145)
Total Bilirubin: 1.1 mg/dL (ref 0.3–1.2)
Total Protein: 7.8 g/dL (ref 6.5–8.1)

## 2020-03-21 LAB — CBC
HCT: 38.5 % (ref 36.0–46.0)
Hemoglobin: 12.2 g/dL (ref 12.0–15.0)
MCH: 28 pg (ref 26.0–34.0)
MCHC: 31.7 g/dL (ref 30.0–36.0)
MCV: 88.5 fL (ref 80.0–100.0)
Platelets: 217 10*3/uL (ref 150–400)
RBC: 4.35 MIL/uL (ref 3.87–5.11)
RDW: 13.9 % (ref 11.5–15.5)
WBC: 9.9 10*3/uL (ref 4.0–10.5)
nRBC: 0 % (ref 0.0–0.2)

## 2020-03-21 LAB — SURGICAL PATHOLOGY

## 2020-03-21 LAB — PHOSPHORUS: Phosphorus: 3.2 mg/dL (ref 2.5–4.6)

## 2020-03-21 LAB — MAGNESIUM: Magnesium: 2 mg/dL (ref 1.7–2.4)

## 2020-03-21 MED ORDER — OXYCODONE HCL 5 MG PO TABS
5.0000 mg | ORAL_TABLET | ORAL | Status: DC | PRN
  Administered 2020-03-21: 5 mg via ORAL
  Filled 2020-03-21: qty 1

## 2020-03-21 MED ORDER — IBUPROFEN 200 MG PO TABS: 600.0000 mg | ORAL_TABLET | Freq: Three times a day (TID) | ORAL | Status: DC | PRN

## 2020-03-21 MED ORDER — IBUPROFEN 600 MG PO TABS: 600.0000 mg | ORAL_TABLET | Freq: Three times a day (TID) | ORAL | 0 refills | Status: DC | PRN

## 2020-03-21 NOTE — Anesthesia Postprocedure Evaluation (Signed)
Anesthesia Post Note  Patient: Norma [name]  Procedure(s) Performed: LAPAROSCOPIC CHOLECYSTECTOMY WITH INTRAOPERATIVE CHOLANGIOGRAM AND WEDGE LIVER BIOPSY (N/A Abdomen)     Patient location during evaluation: PACU Anesthesia Type: General Level of consciousness: awake and alert Pain management: pain level controlled Vital Signs Assessment: post-procedure vital signs reviewed and stable Respiratory status: spontaneous breathing, nonlabored ventilation, respiratory function stable and patient connected to nasal cannula oxygen Cardiovascular status: blood pressure returned to baseline and stable Postop Assessment: no apparent nausea or vomiting Anesthetic complications: no   No complications documented.  Last Vitals:  Vitals:   03/20/20 1943 03/21/20 0432  BP: (!) 146/83 137/83  Pulse: 83 79  Resp: 15 15  Temp: 36.6 C 36.6 C  SpO2: 94% 94%    Last Pain:  Vitals:   03/21/20 1146  TempSrc:   PainSc: Asleep   Pain Goal: Patients Stated Pain Goal: 0 (03/21/20 1046)                 Tiajuana Amass

## 2020-03-21 NOTE — Progress Notes (Addendum)
1 Day Post-Op  Subjective: C/o shoulder pain and feeling achy.  Otherwise tolerated CLD last night and hungry this am.  Voiding well  ROS: See above, otherwise other systems negative  Objective: Vital signs in last 24 hours: Temp:  [97.5 F (36.4 C)-98.8 F (37.1 C)] 97.9 F (36.6 C) (12/03 0432) Pulse Rate:  [68-83] 79 (12/03 0432) Resp:  [13-19] 15 (12/03 0432) BP: (102-146)/(66-84) 137/83 (12/03 0432) SpO2:  [94 %-100 %] 94 % (12/03 0432) Last BM Date: 03/15/20  Intake/Output from previous day: 12/02 0701 - 12/03 0700 In: 2457.5 [I.V.:2407.5; IV Piggyback:50] Out: 20 [Blood:20] Intake/Output this shift: No intake/output data recorded.  PE: Abd: soft, obese, appropriately tender, incisions c/d/i with dermabond, +BS  Lab Results:  Recent Labs    03/20/20 0401 03/21/20 0739  WBC 6.9 9.9  HGB 12.9 12.2  HCT 40.5 38.5  PLT 206 217   BMET Recent Labs    03/19/20 0753 03/20/20 0401  NA 137 137  K 3.7 3.5  CL 102 103  CO2 26 24  GLUCOSE 101* 118*  BUN 9 10  CREATININE 0.95 0.92  CALCIUM 9.3 9.3   PT/INR No results for input(s): LABPROT, INR in the last 72 hours. CMP     Component Value Date/Time   NA 137 03/20/2020 0401   NA 140 05/02/2019 1502   K 3.5 03/20/2020 0401   CL 103 03/20/2020 0401   CO2 24 03/20/2020 0401   GLUCOSE 118 (H) 03/20/2020 0401   BUN 10 03/20/2020 0401   BUN 11 05/02/2019 1502   CREATININE 0.92 03/20/2020 0401   CREATININE 0.94 06/05/2015 1640   CALCIUM 9.3 03/20/2020 0401   PROT 7.6 03/20/2020 0401   PROT 7.3 05/02/2019 1502   ALBUMIN 3.6 03/20/2020 0401   ALBUMIN 4.3 05/02/2019 1502   AST 70 (H) 03/20/2020 0401   ALT 125 (H) 03/20/2020 0401   ALKPHOS 95 03/20/2020 0401   BILITOT 1.1 03/20/2020 0401   BILITOT 0.6 05/02/2019 1502   GFRNONAA >60 03/20/2020 0401   GFRNONAA 68 06/05/2015 1640   GFRAA 91 05/02/2019 1502   GFRAA 78 06/05/2015 1640   Lipase     Component Value Date/Time   LIPASE 32 03/17/2020 0848         Studies/Results: DG Cholangiogram Operative  Result Date: 03/20/2020 CLINICAL DATA:  Laparoscopic cholecystectomy EXAM: INTRAOPERATIVE CHOLANGIOGRAM TECHNIQUE: Cholangiographic images from the C-arm fluoroscopic device were submitted for interpretation post-operatively. Please see the procedural report for the amount of contrast and the fluoroscopy time utilized. COMPARISON:  03/16/2020 FINDINGS: Intraoperative cholangiogram performed during laparoscopic procedure. The biliary confluence, common hepatic duct, and common bile duct appear patent. Overlying artifact noted. No large filling defect, stricture, or obstruction pattern. Contrast easily drains into the duodenum. IMPRESSION: Patent biliary system. Electronically Signed   By: Jerilynn Mages.  Shick M.D.   On: 03/20/2020 11:33    Anti-infectives: Anti-infectives (From admission, onward)   Start     Dose/Rate Route Frequency Ordered Stop   03/17/20 1800  piperacillin-tazobactam (ZOSYN) IVPB 3.375 g  Status:  Discontinued        3.375 g 12.5 mL/hr over 240 Minutes Intravenous Every 8 hours 03/17/20 1613 03/20/20 1427   03/17/20 1700  piperacillin-tazobactam (ZOSYN) IVPB 3.375 g  Status:  Discontinued        3.375 g 100 mL/hr over 30 Minutes Intravenous Every 6 hours 03/17/20 1610 03/17/20 1613   03/16/20 2045  piperacillin-tazobactam (ZOSYN) IVPB 3.375 g  3.375 g 100 mL/hr over 30 Minutes Intravenous  Once 03/16/20 2032 03/16/20 2114   03/16/20 2030  cefTRIAXone (ROCEPHIN) 2 g in sodium chloride 0.9 % 100 mL IVPB  Status:  Discontinued        2 g 200 mL/hr over 30 Minutes Intravenous  Once 03/16/20 2020 03/16/20 2142   03/16/20 2030  metroNIDAZOLE (FLAGYL) IVPB 500 mg  Status:  Discontinued        500 mg 100 mL/hr over 60 Minutes Intravenous  Once 03/16/20 2020 03/16/20 2032       Assessment/Plan Hx chronic pain GERD Type 2 diabetes-currently on no metformin Hypertension Hx lumbar laminectomy/tubal ligation Atrial  fibrillation - on lopressor Hepatitis C - viral load shows non-reactivity Cirrhosis - biopsy pending, but evidence during surgery yesterday.  See pictures.  This finding was discussed with the patient.  She is very adamant that this not be told to anyone that does not need to know and is a bit stand-offish of the discussion at baseline.  We discussed the importance of following up with GI for close monitoring of this problem.  GI is setting up follow up in their office.  POD 1, s/p lap chole with IOC for RUQpain with cholelithiasis/possible cholecystitis with findings of cirrhosis.  Liver biopsy obtained.   - path pending. -adv to carb mod diet -voiding well -pain as expected and oral medications added and adjusted -follow up being arranged by our office and script for pain med NOT sent to pharmacy.  In review of her PDMP, she just received 120 percocet 10/325 by her PCP and these were picked up on 11/24 for a 30 day supply. -DC instructions discussed with patient -stable for DC home. D/W primary service  BTY:OMAY mod diet OK:HTXHFSFS times 111/28/21; Zosyn 11/28>> completed DVT: SCD, ok to have chemical DVT prophylaxis from a general surgery standpoint   LOS: 5 days    Henreitta Cea , [facility] 03/21/2020, 7:58 AM Please see Amion for pager number during day hours 7:00am-4:30pm or 7:00am -11:30am on weekends

## 2020-03-21 NOTE — Discharge Summary (Signed)
Physician Discharge Summary  Norma [name] NLZ:[id number] DOB: 1961-01-13 DOA: 03/16/2020  PCP: [name], NP  Admit date: 03/16/2020 Discharge date: 03/21/2020  Admitted From: Home Disposition:  Home  Discharge Condition:Stable CODE STATUS:FULL Diet recommendation: Heart Healthy   Brief/Interim Summary: Norma B Fordis a 59 y.o.femalewith medical history significant oftype 2 diabetes not on meds, hypertension, depression, anxiety, GERD, chronic pain presenting with complaints of abdominal pain.  She was suspected to have possible cholecystitis.  General surgery and gastroenterology consulted.    She was  suspected to have passed a stone so ERCP not performed.  She underwent laparoscopic cholecystectomy.  Intraoperatively, she was also found to have possible cirrhosis, liver biopsy obtained.  She has been cleared by general surgery for discharge home today.  She is tolerating diet.  She will follow-up with general surgery and gastroenterology as an outpatient.  Following problems were addressed during her hospitalization:  Right upper quadrant pain/cholecystitis: Presented with elevated LFTs, findings of cholecystitis/cholelithiasis.  MRCP showed dilated CBD at 10 mm no choledocholithiasis.  Her abdominal pain improved during the hospitalization and bilirubin went down.  Suspected to have spontaneously passed a stone.  ERCP not done.  Underwent cholecystectomy laparoscopically by general surgery.  She will follow-up with GI and general surgery as an outpatient  Cirrhosis/history of hepatitis C: She will follow-up with GI as an outpatient.  HCV antibody positive.  HCV not detected as per viral load.  Depression/anxiety: Continue home regimen  Left adnexal cyst: Noted on CT abdomen/pelvis.  We recommend to follow-up with GYN as an outpatient.  Hypertension: Currently blood pressure stable.  Continue home regimen  Diabetes type 2: Hemoglobin A1c 6.1.  Not on any medication at home.   Currently diet-controlled.  Will recommend to closely follow-up with primary care physician.    Discharge Diagnoses:  Principal Problem:   Cholelithiasis Active Problems:   Depression   Abdominal pain   Hypertensive urgency   Type 2 diabetes mellitus (HCC)   RUQ pain   Biliary colic symptom   Elevated liver enzymes   Abnormal findings on diagnostic imaging of liver and biliary tract   HCV antibody positive    Discharge Instructions  Discharge Instructions    Diet - low sodium heart healthy   Complete by: As directed    Discharge instructions   Complete by: As directed    1)Please follow-up with general surgery and gastroenterology as an outpatient.  Name and number of the providers have been attached. 2)Follow up with your PCP in 2 weeks.   Increase activity slowly   Complete by: As directed      Allergies as of 03/21/2020      Reactions   Norma [name] [carisoprodol] Itching   Itching/rash around mouth, itching back of throat.    Nortriptyline Rash   Prednisone Rash   REACTION: rash and itching.       Medication List    TAKE these medications   benzonatate 100 MG capsule Commonly known as: TESSALON Take 1 capsule (100 mg total) by mouth every 8 (eight) hours.   buPROPion 300 MG 24 hr tablet Commonly known as: WELLBUTRIN XL Take 300 mg by mouth daily.   cetirizine 10 MG tablet Commonly known as: ZyrTEC Allergy Take 1 tablet (10 mg total) by mouth daily.   cyclobenzaprine 10 MG tablet Commonly known as: FLEXERIL TAKE 1 TABLET BY MOUTH TWICE A DAY AS NEEDED FOR MUSCLE SPASMS   diclofenac 75 MG EC tablet Commonly known as: VOLTAREN Take 1  tablet (75 mg total) by mouth 2 (two) times daily.   diclofenac Sodium 1 % Gel Commonly known as: VOLTAREN Please apply 1-2 times daily to affected area   fluticasone 50 MCG/ACT nasal spray Commonly known as: FLONASE Place 2 sprays into both nostrils daily.   fluticasone 50 MCG/ACT nasal spray Commonly known as:  FLONASE Place 1 spray into both nostrils daily.   gabapentin 100 MG capsule Commonly known as: NEURONTIN Take 1 capsule (100 mg total) by mouth at bedtime.   hydrochlorothiazide 25 MG tablet Commonly known as: HYDRODIURIL Take 1 tablet (25 mg total) by mouth daily.   ibuprofen 600 MG tablet Commonly known as: ADVIL Take 1 tablet (600 mg total) by mouth 3 (three) times daily as needed for headache, mild pain or moderate pain.   losartan 50 MG tablet Commonly known as: COZAAR Take 50 mg by mouth daily.   metFORMIN 500 MG tablet Commonly known as: GLUCOPHAGE Take 500 mg by mouth 2 (two) times daily.   metoprolol tartrate 25 MG tablet Commonly known as: LOPRESSOR Take 1 tablet (25 mg total) by mouth 2 (two) times daily.   multivitamin-iron-minerals-folic acid chewable tablet Chew 1 tablet daily by mouth.   naproxen 500 MG tablet Commonly known as: Naprosyn Take 1 tablet (500 mg total) by mouth 2 (two) times daily with a meal.   omeprazole 20 MG capsule Commonly known as: PRILOSEC Take 2 capsules (40 mg total) by mouth daily.   oxyCODONE-acetaminophen 10-325 MG tablet Commonly known as: PERCOCET Take 1 tablet by mouth 3 (three) times daily.   potassium chloride 10 MEQ tablet Commonly known as: KLOR-CON Take 10 mEq by mouth 2 (two) times daily.   traZODone 100 MG tablet Commonly known as: DESYREL TAKE 2 TABLETS BY MOUTH ONCE AT BEDTIME   Vitamin D (Ergocalciferol) 1.25 MG (50000 UNIT) Caps capsule Commonly known as: DRISDOL TAKE 1 CAPSULE BY MOUTH EVERY 7 (SEVEN) DAYS.       Follow-up Information    [facility] Follow up on 04/08/2020.   Specialty: General Surgery Why: 11:15 am, arrive by 10:45am for paperwork and check in process Contact information: [address] [place] [address] [phone]        Mauri Pole, MD Follow up in 6 week(s).   Specialty: Gastroenterology Contact information: [place] [address] [phone]        [name], NP. Schedule an appointment as soon as possible for a visit in 2 week(s).   Specialty: Nurse Practitioner Contact information: [place] [address] [phone]              Allergies  Allergen Reactions  . Norma [name] [Carisoprodol] Itching    Itching/rash around mouth, itching back of throat.   . Nortriptyline Rash  . Prednisone Rash    REACTION: rash and itching.     Consultations:  GI,surgery   Procedures/Studies: DG Cholangiogram Operative  Result Date: 03/20/2020 CLINICAL DATA:  Laparoscopic cholecystectomy EXAM: INTRAOPERATIVE CHOLANGIOGRAM TECHNIQUE: Cholangiographic images from the C-arm fluoroscopic device were submitted for interpretation post-operatively. Please see the procedural report for the amount of contrast and the fluoroscopy time utilized. COMPARISON:  03/16/2020 FINDINGS: Intraoperative cholangiogram performed during laparoscopic procedure. The biliary confluence, common hepatic duct, and common bile duct appear patent. Overlying artifact noted. No large filling defect, stricture, or obstruction pattern. Contrast easily drains into the duodenum. IMPRESSION: Patent biliary system. Electronically Signed   By: Jerilynn Mages.  Shick M.D.  On: 03/20/2020 11:33   NM Hepatobiliary Liver Func  Result Date: 03/17/2020 CLINICAL DATA:  Concern for acute cholecystitis. EXAM: NUCLEAR MEDICINE HEPATOBILIARY IMAGING TECHNIQUE: Sequential images of the abdomen were obtained out to 60 minutes following intravenous administration of radiopharmaceutical. RADIOPHARMACEUTICALS:  5.5 mCi Tc-29m  Choletec IV COMPARISON:  Ultrasound 03/16/2020 FINDINGS: Prompt uptake by the liver is seen. Imaging was performed for 90 minutes with no biliary excretion identified. No small bowel or gallbladder activity identified. IMPRESSION: 1. There is diffuse tracer uptake by the liver with absent biliary excretion after 190  minutes. Imaging findings are consistent with either common bile duct obstruction or hepatocellular dysfunction secondary to hepatitis. Electronically Signed   By: Kerby Moors M.D.   On: 03/17/2020 14:51   CT ABDOMEN PELVIS W CONTRAST  Result Date: 03/16/2020 CLINICAL DATA:  Right upper quadrant pain. EXAM: CT ABDOMEN AND PELVIS WITH CONTRAST TECHNIQUE: Multidetector CT imaging of the abdomen and pelvis was performed using the standard protocol following bolus administration of intravenous contrast. CONTRAST:  148mL OMNIPAQUE IOHEXOL 300 MG/ML  SOLN COMPARISON:  March 26, 2017 FINDINGS: Lower chest: Very mild atelectasis is seen within the bilateral lung bases. Hepatobiliary: No focal liver abnormality is seen. The gallbladder is moderately distended. No gallstones are identified. Mild pericholecystic inflammatory fat stranding is seen. Pancreas: Unremarkable. No pancreatic ductal dilatation or surrounding inflammatory changes. Spleen: Normal in size without focal abnormality. Adrenals/Urinary Tract: Adrenal glands are unremarkable. Kidneys are normal, without renal calculi, focal lesion, or hydronephrosis. Bladder is unremarkable. Stomach/Bowel: Stomach is within normal limits. The appendix is not clearly identified. No evidence of bowel wall thickening, distention, or inflammatory changes. Vascular/Lymphatic: No significant vascular findings are present. No enlarged abdominal or pelvic lymph nodes. Reproductive: The uterus is normal in size and appearance. A stable 4.3 cm x 5.2 cm lobulated area of low attenuation is seen within the posterior aspect of the left adnexa. Other: No abdominal wall hernia or abnormality. No abdominopelvic ascites. Musculoskeletal: No acute or significant osseous findings. IMPRESSION: 1. Findings consistent with acalculous acute cholecystitis. Correlation with right upper quadrant ultrasound is recommended. 2. Stable left adnexal cyst, likely ovarian in origin. Further  correlation with pelvic ultrasound is recommended. Electronically Signed   By: Virgina Norfolk M.D.   On: 03/16/2020 18:31   MR ABDOMEN MRCP WO CONTRAST  Result Date: 03/18/2020 CLINICAL DATA:  59 year old female with history of right-sided abdominal pain for the past 3 days. EXAM: MRI ABDOMEN WITHOUT CONTRAST  (INCLUDING MRCP) TECHNIQUE: Multiplanar multisequence MR imaging of the abdomen was performed. Heavily T2-weighted images of the biliary and pancreatic ducts were obtained, and three-dimensional MRCP images were rendered by post processing. COMPARISON:  No prior abdominal MRI. CT the abdomen and pelvis 03/16/2020. FINDINGS: Comment: Today's study is limited for detection and characterization of visceral and/or vascular lesions by lack of IV gadolinium. Lower chest: Trace right pleural effusion lying dependently. Hepatobiliary: Diffuse loss of signal intensity throughout the hepatic parenchyma on out of phase dual echo images, indicative of a background of hepatic steatosis. No discrete cystic or solid hepatic lesions are confidently identified on today's noncontrast examination. No intrahepatic biliary ductal dilatation. Common bile duct is mildly dilated measuring 1 cm in the porta hepatis. No filling defects are noted within the common bile duct to suggest choledocholithiasis. There are multiple filling defects within the gallbladder dependently, compatible with small gallstones. Gallbladder is moderately distended. Portions of the gallbladder wall appear mildly thickened and edematous measuring up to 5 mm. Trace amount of  pericholecystic fluid. Pancreas: No definite pancreatic mass or peripancreatic fluid collections or inflammatory changes are noted on today's noncontrast examination. Spleen:  Unremarkable. Adrenals/Urinary Tract: Unenhanced appearance of the kidneys and bilateral adrenal glands is normal. No hydroureteronephrosis in the visualized portions of the abdomen. Stomach/Bowel: Visualized  portions are unremarkable. Vascular/Lymphatic: No aneurysm identified in the visualized abdominal vasculature on today's noncontrast examination. No lymphadenopathy noted in the visualized portions of the abdomen. Other:  No significant volume of ascites. Musculoskeletal: No aggressive appearing osseous lesions are noted in the visualized portions of the skeleton. IMPRESSION: 1. Study is positive for cholelithiasis with some subtle changes which could be indicative of early acute cholecystitis, including mild gallbladder wall thickening and trace amount of pericholecystic fluid. 2. Although the common bile duct is mildly dilated measuring 10 mm in the porta hepatis, there is no choledocholithiasis. Additionally, there is no intrahepatic biliary ductal dilatation to suggest frank biliary tract obstruction. 3. Hepatic steatosis. 4. Trace right pleural effusion lying dependently. Electronically Signed   By: Vinnie Langton M.D.   On: 03/18/2020 05:35   MR 3D Recon At Scanner  Result Date: 03/18/2020 CLINICAL DATA:  59 year old female with history of right-sided abdominal pain for the past 3 days. EXAM: MRI ABDOMEN WITHOUT CONTRAST  (INCLUDING MRCP) TECHNIQUE: Multiplanar multisequence MR imaging of the abdomen was performed. Heavily T2-weighted images of the biliary and pancreatic ducts were obtained, and three-dimensional MRCP images were rendered by post processing. COMPARISON:  No prior abdominal MRI. CT the abdomen and pelvis 03/16/2020. FINDINGS: Comment: Today's study is limited for detection and characterization of visceral and/or vascular lesions by lack of IV gadolinium. Lower chest: Trace right pleural effusion lying dependently. Hepatobiliary: Diffuse loss of signal intensity throughout the hepatic parenchyma on out of phase dual echo images, indicative of a background of hepatic steatosis. No discrete cystic or solid hepatic lesions are confidently identified on today's noncontrast examination. No  intrahepatic biliary ductal dilatation. Common bile duct is mildly dilated measuring 1 cm in the porta hepatis. No filling defects are noted within the common bile duct to suggest choledocholithiasis. There are multiple filling defects within the gallbladder dependently, compatible with small gallstones. Gallbladder is moderately distended. Portions of the gallbladder wall appear mildly thickened and edematous measuring up to 5 mm. Trace amount of pericholecystic fluid. Pancreas: No definite pancreatic mass or peripancreatic fluid collections or inflammatory changes are noted on today's noncontrast examination. Spleen:  Unremarkable. Adrenals/Urinary Tract: Unenhanced appearance of the kidneys and bilateral adrenal glands is normal. No hydroureteronephrosis in the visualized portions of the abdomen. Stomach/Bowel: Visualized portions are unremarkable. Vascular/Lymphatic: No aneurysm identified in the visualized abdominal vasculature on today's noncontrast examination. No lymphadenopathy noted in the visualized portions of the abdomen. Other:  No significant volume of ascites. Musculoskeletal: No aggressive appearing osseous lesions are noted in the visualized portions of the skeleton. IMPRESSION: 1. Study is positive for cholelithiasis with some subtle changes which could be indicative of early acute cholecystitis, including mild gallbladder wall thickening and trace amount of pericholecystic fluid. 2. Although the common bile duct is mildly dilated measuring 10 mm in the porta hepatis, there is no choledocholithiasis. Additionally, there is no intrahepatic biliary ductal dilatation to suggest frank biliary tract obstruction. 3. Hepatic steatosis. 4. Trace right pleural effusion lying dependently. Electronically Signed   By: Vinnie Langton M.D.   On: 03/18/2020 05:35   US Abdomen Limited RUQ (LIVER/GB)  Result Date: 03/16/2020 CLINICAL DATA:  Biliary colic symptoms, hypertension, diabetes EXAM: ULTRASOUND  ABDOMEN  LIMITED RIGHT UPPER QUADRANT COMPARISON:  CT abdomen pelvis 03/27/2017. FINDINGS: Gallbladder: Calcified stone and sludge noted within the gallbladder lumen. No pericholecystic fluid or wall thickening visualized. No sonographic Murphy sign noted by sonographer. Common bile duct: Diameter: 8 mm-upper limits of normal/borderline enlarged Liver: No focal lesion identified. Increased in parenchymal echogenicity. Portal vein is patent on color Doppler imaging with normal direction of blood flow towards the liver. Other: None. IMPRESSION: 1. Cholelithiasis and gallbladder sludge with no findings to suggest acute cholecystitis. 2. The common bile duct measures at the upper limits of normal/borderline enlarged (8 mm) with choledocholithiasis more distally not excluded. 3. Hepatic steatosis. Please note limited evaluation for focal hepatic masses in a patient with hepatic steatosis due to decreased penetration of the acoustic ultrasound waves. Electronically Signed   By: Iven Finn M.D.   On: 03/16/2020 19:46       Subjective: Patient seen and examined the bedside this morning.  Medically stable for discharge.  Discharge Exam: Vitals:   03/20/20 1943 03/21/20 0432  BP: (!) 146/83 137/83  Pulse: 83 79  Resp: 15 15  Temp: 97.9 F (36.6 C) 97.9 F (36.6 C)  SpO2: 94% 94%   Vitals:   03/20/20 1315 03/20/20 1426 03/20/20 1943 03/21/20 0432  BP: 119/76 135/76 (!) 146/83 137/83  Pulse: 72 73 83 79  Resp: 13 18 15 15   Temp: 97.7 F (36.5 C) (!) 97.5 F (36.4 C) 97.9 F (36.6 C) 97.9 F (36.6 C)  TempSrc:  Oral Oral Oral  SpO2: 98% 97% 94% 94%  Weight:      Height:        General: Pt is alert, awake, not in acute distress Cardiovascular: RRR, S1/S2 +, no rubs, no gallops Respiratory: CTA bilaterally, no wheezing, no rhonchi Abdominal: Soft, NT, ND, bowel sounds + Extremities: no edema, no cyanosis    The results of significant diagnostics from this hospitalization (including  imaging, microbiology, ancillary and laboratory) are listed below for reference.     Microbiology: Recent Results (from the past 240 hour(s))  Resp Panel by RT-PCR (Flu A&B, Covid) Nasopharyngeal Swab     Status: None   Collection Time: 03/16/20  8:21 PM   Specimen: Nasopharyngeal Swab; Nasopharyngeal(NP) swabs in vial transport medium  Result Value Ref Range Status   SARS Coronavirus 2 by RT PCR NEGATIVE NEGATIVE Final    Comment: (NOTE) SARS-CoV-2 target nucleic acids are NOT DETECTED.  The SARS-CoV-2 RNA is generally detectable in upper respiratory specimens during the acute phase of infection. The lowest concentration of SARS-CoV-2 viral copies this assay can detect is 138 copies/mL. A negative result does not preclude SARS-Cov-2 infection and should not be used as the sole basis for treatment or other patient management decisions. A negative result may occur with  improper specimen collection/handling, submission of specimen other than nasopharyngeal swab, presence of viral mutation(s) within the areas targeted by this assay, and inadequate number of viral copies(<138 copies/mL). A negative result must be combined with clinical observations, patient history, and epidemiological information. The expected result is Negative.  Fact Sheet for Patients:  EntrepreneurPulse.com.au  Fact Sheet for Healthcare Providers:  IncredibleEmployment.be  This test is no t yet approved or cleared by the [place] FDA and  has been authorized for detection and/or diagnosis of SARS-CoV-2 by FDA under an Emergency Use Authorization (EUA). This EUA will remain  in effect (meaning this test can be used) for the duration of the COVID-19 declaration under Section 564(b)(1) of the  Act, 21 U.S.C.section 360bbb-3(b)(1), unless the authorization is terminated  or revoked sooner.       Influenza A by PCR NEGATIVE NEGATIVE Final   Influenza B by PCR NEGATIVE  NEGATIVE Final    Comment: (NOTE) The Xpert Xpress SARS-CoV-2/FLU/RSV plus assay is intended as an aid in the diagnosis of influenza from Nasopharyngeal swab specimens and should not be used as a sole basis for treatment. Nasal washings and aspirates are unacceptable for Xpert Xpress SARS-CoV-2/FLU/RSV testing.  Fact Sheet for Patients: EntrepreneurPulse.com.au  Fact Sheet for Healthcare Providers: IncredibleEmployment.be  This test is not yet approved or cleared by the [place] FDA and has been authorized for detection and/or diagnosis of SARS-CoV-2 by FDA under an Emergency Use Authorization (EUA). This EUA will remain in effect (meaning this test can be used) for the duration of the COVID-19 declaration under Section 564(b)(1) of the Act, 21 U.S.C. section 360bbb-3(b)(1), unless the authorization is terminated or revoked.  Performed at [facility], [place] [address], [place], [place] [address]   Culture, Urine     Status: Abnormal   Collection Time: 03/16/20 10:08 PM   Specimen: Urine, Clean Catch  Result Value Ref Range Status   Specimen Description   Final    URINE, CLEAN CATCH Performed at [facility], [place] [address] [place], [place] [address]    Special Requests   Final    NONE Performed at [facility], [place] [address], [place], [place] [address]    Culture MULTIPLE SPECIES PRESENT, SUGGEST RECOLLECTION (A)  Final   Report Status 03/18/2020 FINAL  Final     Labs: BNP (last 3 results) Recent Labs    05/02/19 1502  BNP <0.7   Basic Metabolic Panel: Recent Labs  Lab 03/17/20 0848 03/18/20 0435 03/19/20 0753 03/20/20 0401 03/21/20 0739  NA 139 135 137 137 137  K 4.0 3.9 3.7 3.5 3.5  CL 103 101 102 103 102  CO2 25 24 26 24 24   GLUCOSE 134* 103* 101* 118* 112*  BUN 12 10 9 10 8   CREATININE 0.96 0.76 0.95 0.92 0.83  CALCIUM 8.6* 9.0 9.3 9.3 8.8*  MG 1.9 2.0  2.0 2.0 2.0  PHOS  --   --   --  3.4 3.2   Liver Function Tests: Recent Labs  Lab 03/17/20 0848 03/18/20 0435 03/19/20 0753 03/20/20 0401 03/21/20 0739  AST 350* 322* 133* 70* 77*  ALT 146* 227* 173* 125* 110*  ALKPHOS 109 112 107 95 82  BILITOT 3.0* 6.9* 2.2* 1.1 1.1  PROT 7.5 7.3 7.6 7.6 7.8  ALBUMIN 3.5 3.6 3.8 3.6 3.9   Recent Labs  Lab 03/16/20 1648 03/17/20 0848  LIPASE 34 32   No results for input(s): AMMONIA in the last 168 hours. CBC: Recent Labs  Lab 03/17/20 0848 03/18/20 0435 03/19/20 0753 03/20/20 0401 03/21/20 0739  WBC 9.1 6.3 4.9 6.9 9.9  NEUTROABS 6.8  --  2.6  --   --   HGB 12.4 12.5 12.8 12.9 12.2  HCT 39.6 39.6 41.0 40.5 38.5  MCV 89.0 87.8 89.1 87.9 88.5  PLT 197 181 201 206 217   Cardiac Enzymes: No results for input(s): CKTOTAL, CKMB, CKMBINDEX, TROPONINI in the last 168 hours. BNP: Invalid input(s): POCBNP CBG: Recent Labs  Lab 03/19/20 1145 03/19/20 1651 03/19/20 2106 03/20/20 0753 03/20/20 1701  GLUCAP 155* 105* 125* 105* 173*   D-Dimer No results for input(s): DDIMER in the last 72 hours. Hgb  A1c No results for input(s): HGBA1C in the last 72 hours. Lipid Profile No results for input(s): CHOL, HDL, LDLCALC, TRIG, CHOLHDL, LDLDIRECT in the last 72 hours. Thyroid function studies No results for input(s): TSH, T4TOTAL, T3FREE, THYROIDAB in the last 72 hours.  Invalid input(s): FREET3 Anemia work up No results for input(s): VITAMINB12, FOLATE, FERRITIN, TIBC, IRON, RETICCTPCT in the last 72 hours. Urinalysis    Component Value Date/Time   COLORURINE YELLOW 03/16/2020 1648   APPEARANCEUR CLEAR 03/16/2020 1648   LABSPEC 1.018 03/16/2020 1648   PHURINE 9.0 (H) 03/16/2020 1648   GLUCOSEU NEGATIVE 03/16/2020 1648   HGBUR NEGATIVE 03/16/2020 1648   HGBUR negative 10/10/2009 [place] 03/16/2020 1648   BILIRUBINUR NEG 08/27/2014 1134   KETONESUR NEGATIVE 03/16/2020 1648   PROTEINUR 30 (A) 03/16/2020 1648    UROBILINOGEN 0.2 08/12/2019 1738   NITRITE NEGATIVE 03/16/2020 1648   LEUKOCYTESUR TRACE (A) 03/16/2020 1648   Sepsis Labs Invalid input(s): PROCALCITONIN,  WBC,  LACTICIDVEN Microbiology Recent Results (from the past 240 hour(s))  Resp Panel by RT-PCR (Flu A&B, Covid) Nasopharyngeal Swab     Status: None   Collection Time: 03/16/20  8:21 PM   Specimen: Nasopharyngeal Swab; Nasopharyngeal(NP) swabs in vial transport medium  Result Value Ref Range Status   SARS Coronavirus 2 by RT PCR NEGATIVE NEGATIVE Final    Comment: (NOTE) SARS-CoV-2 target nucleic acids are NOT DETECTED.  The SARS-CoV-2 RNA is generally detectable in upper respiratory specimens during the acute phase of infection. The lowest concentration of SARS-CoV-2 viral copies this assay can detect is 138 copies/mL. A negative result does not preclude SARS-Cov-2 infection and should not be used as the sole basis for treatment or other patient management decisions. A negative result may occur with  improper specimen collection/handling, submission of specimen other than nasopharyngeal swab, presence of viral mutation(s) within the areas targeted by this assay, and inadequate number of viral copies(<138 copies/mL). A negative result must be combined with clinical observations, patient history, and epidemiological information. The expected result is Negative.  Fact Sheet for Patients:  EntrepreneurPulse.com.au  Fact Sheet for Healthcare Providers:  IncredibleEmployment.be  This test is no t yet approved or cleared by the [place] FDA and  has been authorized for detection and/or diagnosis of SARS-CoV-2 by FDA under an Emergency Use Authorization (EUA). This EUA will remain  in effect (meaning this test can be used) for the duration of the COVID-19 declaration under Section 564(b)(1) of the Act, 21 U.S.C.section 360bbb-3(b)(1), unless the authorization is terminated  or revoked  sooner.       Influenza A by PCR NEGATIVE NEGATIVE Final   Influenza B by PCR NEGATIVE NEGATIVE Final    Comment: (NOTE) The Xpert Xpress SARS-CoV-2/FLU/RSV plus assay is intended as an aid in the diagnosis of influenza from Nasopharyngeal swab specimens and should not be used as a sole basis for treatment. Nasal washings and aspirates are unacceptable for Xpert Xpress SARS-CoV-2/FLU/RSV testing.  Fact Sheet for Patients: EntrepreneurPulse.com.au  Fact Sheet for Healthcare Providers: IncredibleEmployment.be  This test is not yet approved or cleared by the [place] FDA and has been authorized for detection and/or diagnosis of SARS-CoV-2 by FDA under an Emergency Use Authorization (EUA). This EUA will remain in effect (meaning this test can be used) for the duration of the COVID-19 declaration under Section 564(b)(1) of the Act, 21 U.S.C. section 360bbb-3(b)(1), unless the authorization is terminated or revoked.  Performed at [facility], [place]  [address] [place], [place] [address]   Culture, Urine     Status: Abnormal   Collection Time: 03/16/20 10:08 PM   Specimen: Urine, Clean Catch  Result Value Ref Range Status   Specimen Description   Final    URINE, CLEAN CATCH Performed at [facility], [place] [address], [place], [place] [address]    Special Requests   Final    NONE Performed at [facility], [place] [address] [place], [place] [address]    Culture MULTIPLE SPECIES PRESENT, SUGGEST RECOLLECTION (A)  Final   Report Status 03/18/2020 FINAL  Final    Please note: You were cared for by a hospitalist during your hospital stay. Once you are discharged, your primary care physician will handle any further medical issues. Please note that NO REFILLS for any discharge medications will be authorized once you are discharged, as it is imperative that you return to your primary care  physician (or establish a relationship with a primary care physician if you do not have one) for your post hospital discharge needs so that they can reassess your need for medications and monitor your lab values.    Time coordinating discharge: 40 minutes  SIGNED:   Shelly Coss, MD  [facility] 03/21/2020, 10:50 AM Pager [phone]  If 7PM-7AM, please contact night-coverage [email or web address] Password [email or web address]

## 2020-03-21 NOTE — Discharge Instructions (Signed)
[facility] ° °Please arrive at least 30 min before your appointment to complete your check in paperwork.  If you are unable to arrive 30 min prior to your appointment time we may have to cancel or reschedule you. °LAPAROSCOPIC SURGERY: POST OP INSTRUCTIONS °Always review your discharge instruction sheet given to you by the facility where your surgery was performed. °IF YOU HAVE DISABILITY OR FAMILY LEAVE FORMS, YOU MUST BRING THEM TO THE OFFICE FOR PROCESSING.   °DO NOT GIVE THEM TO YOUR DOCTOR. ° °PAIN CONTROL ° °1. First take acetaminophen (Tylenol) AND/or ibuprofen (Advil) to control your pain after surgery.  Follow directions on package.  Taking acetaminophen (Tylenol) and/or ibuprofen (Advil) regularly after surgery will help to control your pain and lower the amount of prescription pain medication you may need.  You should not take more than 4,000 mg (4 grams) of acetaminophen (Tylenol) in 24 hours.  You should not take ibuprofen (Advil), aleve, motrin, naprosyn or other NSAIDS if you have a history of stomach ulcers or chronic kidney disease.  °2. A prescription for pain medication may be given to you upon discharge.  Take your pain medication as prescribed, if you still have uncontrolled pain after taking acetaminophen (Tylenol) or ibuprofen (Advil). °3. Use ice packs to help control pain. °4. If you need a refill on your pain medication, please contact your pharmacy.  They will contact our office to request authorization. Prescriptions will not be filled after 5pm or on week-ends. ° °HOME MEDICATIONS °5. Take your usually prescribed medications unless otherwise directed. ° °DIET °6. You should follow a light diet the first few days after arrival home.  Be sure to include lots of fluids daily. Avoid fatty, fried foods.  ° °CONSTIPATION °7. It is common to experience some constipation after surgery and if you are taking pain medication.  Increasing fluid intake and taking a stool  softener (such as Colace) will usually help or prevent this problem from occurring.  A mild laxative (Milk of Magnesia or Miralax) should be taken according to package instructions if there are no bowel movements after 48 hours. ° °WOUND/INCISION CARE °8. Most patients will experience some swelling and bruising in the area of the incisions.  Ice packs will help.  Swelling and bruising can take several days to resolve.  °9. Unless discharge instructions indicate otherwise, follow guidelines below  °a. STERI-STRIPS - you may remove your outer bandages 48 hours after surgery, and you may shower at that time.  You have steri-strips (small skin tapes) in place directly over the incision.  These strips should be left on the skin for 7-10 days.   °b. DERMABOND/SKIN GLUE - you may shower in 24 hours.  The glue will flake off over the next 2-3 weeks. °10. Any sutures or staples will be removed at the office during your follow-up visit. ° °ACTIVITIES °11. You may resume regular (light) daily activities beginning the next day--such as daily self-care, walking, climbing stairs--gradually increasing activities as tolerated.  You may have sexual intercourse when it is comfortable.  Refrain from any heavy lifting or straining until approved by your doctor. °a. You may drive when you are no longer taking prescription pain medication, you can comfortably wear a seatbelt, and you can safely maneuver your car and apply brakes. ° °FOLLOW-UP °12. You should see your doctor in the office for a follow-up appointment approximately 2-3 weeks after your surgery.  You should have been given your post-op/follow-up appointment when   your surgery was scheduled.  If you did not receive a post-op/follow-up appointment, make sure that you call for this appointment within a day or two after you arrive home to insure a convenient appointment time. ° ° °WHEN TO CALL YOUR DOCTOR: °1. Fever over 101.0 °2. Inability to urinate °3. Continued bleeding from  incision. °4. Increased pain, redness, or drainage from the incision. °5. Increasing abdominal pain ° °The clinic staff is available to answer your questions during regular business hours.  Please don’t hesitate to call and ask to speak to one of the nurses for clinical concerns.  If you have a medical emergency, go to the nearest emergency room or call 911.  A surgeon from [facility] is always on call at the hospital. °[address], [address], [place], [place]  [address] ? [address] [address], [place], [place]   [address] °[phone] ? [phone] ? FAX [phone] ° °

## 2020-03-28 ENCOUNTER — Ambulatory Visit: Payer: Medicare Other | Admitting: *Deleted

## 2020-03-28 ENCOUNTER — Telehealth: Payer: Medicare Other

## 2020-03-28 DIAGNOSIS — G894 Chronic pain syndrome: Secondary | ICD-10-CM

## 2020-03-28 DIAGNOSIS — I10 Essential (primary) hypertension: Secondary | ICD-10-CM

## 2020-03-28 DIAGNOSIS — E119 Type 2 diabetes mellitus without complications: Secondary | ICD-10-CM

## 2020-03-28 NOTE — Chronic Care Management (AMB) (Signed)
     Chronic Care Management   Note  94/17/4081 Name: Norma [name] MRN: [id number] DOB: 12-Apr-1961  Successful outreach to patient related to referral Red Emmi referral. See image below.     Clinical Interventions for Red Emmi :   Evaluation of current treatment plan related to discharge summary of hospitalization from 03/16/20-03/21/20 and patient's adherence to plan as  established by provider.   Reviewed discharge instructions with patient.   Reviewed reason for hospitalization including laparoscopic surgery for gallbladder removal and liver biopsy on 03/20/20.    Ensured patient has all medications listed on hospital discharge instruction sheet   Assessed pain control. Patient states she has minimal surgical pain but c/o muscle pain in neck and arm from hospital bed. Suggested she try  NSAID instead of oxycodone    Discussed common side effect of constipation when taking narcotics to relieve pain.    Reviewed scheduled/upcoming provider appointments including: Primary Care  appointment with [name] NP on 04/08/20 at 2:15 pm;  with [facility] on 12/21/231 at 11:15 am and with Dr Silverio Decamp on 05/15/20 at 8:30 am. Ensured patient has transportation to  appointments.    Education provided regarding problems requiring surgeon notification and ensured patient has phone number for surgeon and primary care  provider.     Kelli Churn RN, CCM, [facility] RN Care Manager [phone] Providing coverage for Lazaro Arms CCM RN at [facility]

## 2020-04-02 ENCOUNTER — Ambulatory Visit (INDEPENDENT_AMBULATORY_CARE_PROVIDER_SITE_OTHER): Payer: Medicare Other | Admitting: Family Medicine

## 2020-04-02 ENCOUNTER — Other Ambulatory Visit: Payer: Self-pay

## 2020-04-02 VITALS — BP 112/60 | Ht 68.0 in | Wt 245.1 lb

## 2020-04-02 DIAGNOSIS — G8929 Other chronic pain: Secondary | ICD-10-CM | POA: Diagnosis not present

## 2020-04-02 DIAGNOSIS — M25562 Pain in left knee: Secondary | ICD-10-CM

## 2020-04-02 DIAGNOSIS — M67911 Unspecified disorder of synovium and tendon, right shoulder: Secondary | ICD-10-CM

## 2020-04-02 DIAGNOSIS — M7062 Trochanteric bursitis, left hip: Secondary | ICD-10-CM

## 2020-04-02 NOTE — Patient Instructions (Addendum)
It was nice to meet you today,  I would like you to call your orthopedist to schedule an appointment to discuss your left hip and knee pain as well as your shoulder pain.  I think your knee and hip pain may be due to arthritis therefore I am going to get some x-rays.  I currently do not have any other medication options I can give you for treatment as you are currently doing all of the things we would normally recommend, therefore I think you should discuss things with the orthopedist to see if surgery is necessary.  You can get the x-rays done at [facility] where [facility] is located at any time during the regular business hours.    If you have any questions or concerns, please call our office.  Have a great day,  Clemetine Marker, MD

## 2020-04-02 NOTE — Progress Notes (Signed)
    SUBJECTIVE:   CHIEF COMPLAINT / HPI:   Herniated lumbar disc, L trochanteric bursitis.  Patient complains of worsening left hip and knee pain she has Had back surgery in the past and sees an orthopedist as well as a pain specialist. She states this pain Feels worse than arthritis.  Endorses knee swelling.  She takes percocet for her back pain 3 times a day 10mg .  She also takes advil.  She has tried applying heat, cold packs,  Uses voltaren gel.  Hurts worse if she lays on her left side.  Has also tried Meloxicam, cyclobenzaprine in the past.   Patient also states that her right shoulder has been hurting since she had gallbladder surgery.  She states she first noticed it after the surgery.  She has had both rotator cuffs surgically treated previously.  Not experiencing numbness or tingling.     PERTINENT  PMH / PSH: herniated disc, rotator cuff tear  OBJECTIVE:   BP 112/60   Ht 5\' 8"  (1.727 m)   Wt 245 lb 2 oz (111.2 kg)   BMI 37.27 kg/m   Gen: alert, oriented.  Msk: empty can test positive on right for weakness and pain.  Limited range of motion on right shoulder.   L hip nontender to palpation over the greater trochanter.  Groin and lateral hip pain with FABER test.  .    ASSESSMENT/PLAN:   Left hip pain nontender over the trochanter.  Exam more consistent with hip joint pathology such as arthritis or impingement syndrome.  Patient already on several treatment modalities and sees a pain specialist.  Do not have anything else to add for her in terms of treatment.  Advised pt to schedule an appt with her orthopedist.  Will get DG hip and knee to help assess for osteoarthritis.    Tendinopathy of right rotator cuff Pain in previously repaired shoulder after surgery.  Possibly due to positioning while she was under anesthesia. Physical exam showed weakness in the supraspinatus, concerning for at least a partial tear. Advised pt to discuss this with her orthopedist at her next appt.        Benay Pike, MD [place]

## 2020-04-04 DIAGNOSIS — M67911 Unspecified disorder of synovium and tendon, right shoulder: Secondary | ICD-10-CM | POA: Insufficient documentation

## 2020-04-04 NOTE — Assessment & Plan Note (Signed)
nontender over the trochanter.  Exam more consistent with hip joint pathology such as arthritis or impingement syndrome.  Patient already on several treatment modalities and sees a pain specialist.  Do not have anything else to add for her in terms of treatment.  Advised pt to schedule an appt with her orthopedist.  Will get DG hip and knee to help assess for osteoarthritis.

## 2020-04-04 NOTE — Assessment & Plan Note (Signed)
Pain in previously repaired shoulder after surgery.  Possibly due to positioning while she was under anesthesia. Physical exam showed weakness in the supraspinatus, concerning for at least a partial tear. Advised pt to discuss this with her orthopedist at her next appt.

## 2020-04-05 DIAGNOSIS — G8929 Other chronic pain: Secondary | ICD-10-CM | POA: Diagnosis not present

## 2020-04-05 DIAGNOSIS — Z79899 Other long term (current) drug therapy: Secondary | ICD-10-CM | POA: Diagnosis not present

## 2020-04-05 DIAGNOSIS — M5442 Lumbago with sciatica, left side: Secondary | ICD-10-CM | POA: Diagnosis not present

## 2020-04-05 DIAGNOSIS — M25532 Pain in left wrist: Secondary | ICD-10-CM | POA: Diagnosis not present

## 2020-04-07 ENCOUNTER — Other Ambulatory Visit: Payer: Self-pay

## 2020-04-07 ENCOUNTER — Ambulatory Visit
Admission: RE | Admit: 2020-04-07 | Discharge: 2020-04-07 | Disposition: A | Discharge status: Home / Self Care | Payer: Medicare Other | Source: Ambulatory Visit | Attending: Family Medicine | Admitting: Family Medicine

## 2020-04-07 DIAGNOSIS — G8929 Other chronic pain: Secondary | ICD-10-CM

## 2020-04-07 DIAGNOSIS — M25562 Pain in left knee: Secondary | ICD-10-CM

## 2020-04-07 DIAGNOSIS — M170 Bilateral primary osteoarthritis of knee: Secondary | ICD-10-CM | POA: Diagnosis not present

## 2020-04-07 DIAGNOSIS — M1612 Unilateral primary osteoarthritis, left hip: Secondary | ICD-10-CM | POA: Diagnosis not present

## 2020-04-07 DIAGNOSIS — M7062 Trochanteric bursitis, left hip: Secondary | ICD-10-CM

## 2020-04-08 DIAGNOSIS — M25511 Pain in right shoulder: Secondary | ICD-10-CM | POA: Diagnosis not present

## 2020-04-08 DIAGNOSIS — R0602 Shortness of breath: Secondary | ICD-10-CM | POA: Diagnosis not present

## 2020-04-08 DIAGNOSIS — M79604 Pain in right leg: Secondary | ICD-10-CM | POA: Diagnosis not present

## 2020-04-08 DIAGNOSIS — M542 Cervicalgia: Secondary | ICD-10-CM | POA: Diagnosis not present

## 2020-04-08 DIAGNOSIS — E559 Vitamin D deficiency, unspecified: Secondary | ICD-10-CM | POA: Diagnosis not present

## 2020-04-08 DIAGNOSIS — E7800 Pure hypercholesterolemia, unspecified: Secondary | ICD-10-CM | POA: Diagnosis not present

## 2020-04-08 DIAGNOSIS — I10 Essential (primary) hypertension: Secondary | ICD-10-CM | POA: Diagnosis not present

## 2020-04-08 DIAGNOSIS — Z79899 Other long term (current) drug therapy: Secondary | ICD-10-CM | POA: Diagnosis not present

## 2020-04-08 DIAGNOSIS — E119 Type 2 diabetes mellitus without complications: Secondary | ICD-10-CM | POA: Diagnosis not present

## 2020-04-08 DIAGNOSIS — M79605 Pain in left leg: Secondary | ICD-10-CM | POA: Diagnosis not present

## 2020-04-08 DIAGNOSIS — Z0000 Encounter for general adult medical examination without abnormal findings: Secondary | ICD-10-CM | POA: Diagnosis not present

## 2020-04-08 DIAGNOSIS — R5383 Other fatigue: Secondary | ICD-10-CM | POA: Diagnosis not present

## 2020-04-09 ENCOUNTER — Telehealth: Payer: Self-pay | Admitting: Family Medicine

## 2020-04-09 NOTE — Telephone Encounter (Signed)
Patient would like for someone to call her with the results from her hip and knee xray. She is still having a lot of pain.   The best call back number is [phone].

## 2020-04-11 ENCOUNTER — Telehealth: Payer: Self-pay | Admitting: Family Medicine

## 2020-04-11 NOTE — Telephone Encounter (Signed)
Discussed results with patient.  Pt has not reached out to her orthopedist since our last visit.  Advised pt that given the number of pharmacologic and physiotherapy treatments she has had in the past with limited success, it would be best if she spoke with her orthopedist in regards to possible surgical treatment options.

## 2020-04-30 DIAGNOSIS — Z7689 Persons encountering health services in other specified circumstances: Secondary | ICD-10-CM | POA: Diagnosis not present

## 2020-04-30 DIAGNOSIS — K760 Fatty (change of) liver, not elsewhere classified: Secondary | ICD-10-CM | POA: Diagnosis not present

## 2020-04-30 DIAGNOSIS — Z8619 Personal history of other infectious and parasitic diseases: Secondary | ICD-10-CM | POA: Diagnosis not present

## 2020-04-30 DIAGNOSIS — Z86010 Personal history of colonic polyps: Secondary | ICD-10-CM | POA: Diagnosis not present

## 2020-05-09 DIAGNOSIS — Z79899 Other long term (current) drug therapy: Secondary | ICD-10-CM | POA: Diagnosis not present

## 2020-05-09 DIAGNOSIS — E119 Type 2 diabetes mellitus without complications: Secondary | ICD-10-CM | POA: Diagnosis not present

## 2020-05-09 DIAGNOSIS — M5442 Lumbago with sciatica, left side: Secondary | ICD-10-CM | POA: Diagnosis not present

## 2020-05-09 DIAGNOSIS — F32A Depression, unspecified: Secondary | ICD-10-CM | POA: Diagnosis not present

## 2020-05-09 DIAGNOSIS — G8929 Other chronic pain: Secondary | ICD-10-CM | POA: Diagnosis not present

## 2020-05-09 DIAGNOSIS — E559 Vitamin D deficiency, unspecified: Secondary | ICD-10-CM | POA: Diagnosis not present

## 2020-05-09 DIAGNOSIS — Z9181 History of falling: Secondary | ICD-10-CM | POA: Diagnosis not present

## 2020-05-15 ENCOUNTER — Ambulatory Visit: Payer: Medicare Other | Admitting: Gastroenterology

## 2020-06-02 DIAGNOSIS — Z1211 Encounter for screening for malignant neoplasm of colon: Secondary | ICD-10-CM | POA: Diagnosis not present

## 2020-06-02 DIAGNOSIS — K760 Fatty (change of) liver, not elsewhere classified: Secondary | ICD-10-CM | POA: Diagnosis not present

## 2020-06-02 DIAGNOSIS — R7989 Other specified abnormal findings of blood chemistry: Secondary | ICD-10-CM | POA: Diagnosis not present

## 2020-06-09 DIAGNOSIS — M25532 Pain in left wrist: Secondary | ICD-10-CM | POA: Diagnosis not present

## 2020-06-09 DIAGNOSIS — Z9181 History of falling: Secondary | ICD-10-CM | POA: Diagnosis not present

## 2020-06-09 DIAGNOSIS — G8929 Other chronic pain: Secondary | ICD-10-CM | POA: Diagnosis not present

## 2020-06-09 DIAGNOSIS — M5442 Lumbago with sciatica, left side: Secondary | ICD-10-CM | POA: Diagnosis not present

## 2020-06-09 DIAGNOSIS — F32A Depression, unspecified: Secondary | ICD-10-CM | POA: Diagnosis not present

## 2020-06-09 DIAGNOSIS — Z79899 Other long term (current) drug therapy: Secondary | ICD-10-CM | POA: Diagnosis not present

## 2020-06-18 DIAGNOSIS — I10 Essential (primary) hypertension: Secondary | ICD-10-CM | POA: Diagnosis not present

## 2020-06-18 DIAGNOSIS — E1140 Type 2 diabetes mellitus with diabetic neuropathy, unspecified: Secondary | ICD-10-CM | POA: Diagnosis not present

## 2020-06-18 DIAGNOSIS — R072 Precordial pain: Secondary | ICD-10-CM | POA: Diagnosis not present

## 2020-06-27 DIAGNOSIS — G8929 Other chronic pain: Secondary | ICD-10-CM | POA: Diagnosis not present

## 2020-06-27 DIAGNOSIS — M7062 Trochanteric bursitis, left hip: Secondary | ICD-10-CM | POA: Diagnosis not present

## 2020-06-27 DIAGNOSIS — Z79899 Other long term (current) drug therapy: Secondary | ICD-10-CM | POA: Diagnosis not present

## 2020-06-27 DIAGNOSIS — M545 Low back pain, unspecified: Secondary | ICD-10-CM | POA: Diagnosis not present

## 2020-06-27 DIAGNOSIS — F32A Depression, unspecified: Secondary | ICD-10-CM | POA: Diagnosis not present

## 2020-07-03 NOTE — Telephone Encounter (Signed)
Error

## 2020-07-07 DIAGNOSIS — E7800 Pure hypercholesterolemia, unspecified: Secondary | ICD-10-CM | POA: Diagnosis not present

## 2020-07-07 DIAGNOSIS — F32A Depression, unspecified: Secondary | ICD-10-CM | POA: Diagnosis not present

## 2020-07-07 DIAGNOSIS — E559 Vitamin D deficiency, unspecified: Secondary | ICD-10-CM | POA: Diagnosis not present

## 2020-07-07 DIAGNOSIS — I10 Essential (primary) hypertension: Secondary | ICD-10-CM | POA: Diagnosis not present

## 2020-07-07 DIAGNOSIS — E119 Type 2 diabetes mellitus without complications: Secondary | ICD-10-CM | POA: Diagnosis not present

## 2020-07-07 DIAGNOSIS — Z0000 Encounter for general adult medical examination without abnormal findings: Secondary | ICD-10-CM | POA: Diagnosis not present

## 2020-07-07 DIAGNOSIS — R5383 Other fatigue: Secondary | ICD-10-CM | POA: Diagnosis not present

## 2020-07-07 DIAGNOSIS — R8290 Unspecified abnormal findings in urine: Secondary | ICD-10-CM | POA: Diagnosis not present

## 2020-07-07 DIAGNOSIS — Z20822 Contact with and (suspected) exposure to covid-19: Secondary | ICD-10-CM | POA: Diagnosis not present

## 2020-07-07 DIAGNOSIS — M129 Arthropathy, unspecified: Secondary | ICD-10-CM | POA: Diagnosis not present

## 2020-07-07 DIAGNOSIS — Z9181 History of falling: Secondary | ICD-10-CM | POA: Diagnosis not present

## 2020-07-07 DIAGNOSIS — Z79899 Other long term (current) drug therapy: Secondary | ICD-10-CM | POA: Diagnosis not present

## 2020-07-07 DIAGNOSIS — D539 Nutritional anemia, unspecified: Secondary | ICD-10-CM | POA: Diagnosis not present

## 2020-07-07 DIAGNOSIS — J302 Other seasonal allergic rhinitis: Secondary | ICD-10-CM | POA: Diagnosis not present

## 2020-07-22 DIAGNOSIS — H1013 Acute atopic conjunctivitis, bilateral: Secondary | ICD-10-CM | POA: Diagnosis not present

## 2020-07-22 DIAGNOSIS — J302 Other seasonal allergic rhinitis: Secondary | ICD-10-CM | POA: Diagnosis not present

## 2020-07-22 DIAGNOSIS — Z91030 Bee allergy status: Secondary | ICD-10-CM | POA: Diagnosis not present

## 2020-07-22 DIAGNOSIS — J4540 Moderate persistent asthma, uncomplicated: Secondary | ICD-10-CM | POA: Diagnosis not present

## 2020-07-22 DIAGNOSIS — R0602 Shortness of breath: Secondary | ICD-10-CM | POA: Diagnosis not present

## 2020-07-22 DIAGNOSIS — J3089 Other allergic rhinitis: Secondary | ICD-10-CM | POA: Diagnosis not present

## 2020-07-28 ENCOUNTER — Other Ambulatory Visit: Payer: Self-pay | Admitting: Family Medicine

## 2020-07-28 DIAGNOSIS — R0602 Shortness of breath: Secondary | ICD-10-CM

## 2020-07-28 DIAGNOSIS — R002 Palpitations: Secondary | ICD-10-CM

## 2020-07-29 DIAGNOSIS — G8929 Other chronic pain: Secondary | ICD-10-CM | POA: Diagnosis not present

## 2020-07-29 DIAGNOSIS — Z9181 History of falling: Secondary | ICD-10-CM | POA: Diagnosis not present

## 2020-07-29 DIAGNOSIS — M5442 Lumbago with sciatica, left side: Secondary | ICD-10-CM | POA: Diagnosis not present

## 2020-07-29 DIAGNOSIS — Z79899 Other long term (current) drug therapy: Secondary | ICD-10-CM | POA: Diagnosis not present

## 2020-07-29 DIAGNOSIS — M25532 Pain in left wrist: Secondary | ICD-10-CM | POA: Diagnosis not present

## 2020-08-08 ENCOUNTER — Other Ambulatory Visit (HOSPITAL_COMMUNITY)
Admission: RE | Admit: 2020-08-08 | Discharge: 2020-08-08 | Disposition: A | Discharge status: Home / Self Care | Payer: Medicare Other | Source: Ambulatory Visit | Attending: Family Medicine | Admitting: Family Medicine

## 2020-08-08 ENCOUNTER — Other Ambulatory Visit: Payer: Self-pay

## 2020-08-08 ENCOUNTER — Encounter: Payer: Self-pay | Admitting: Family Medicine

## 2020-08-08 ENCOUNTER — Ambulatory Visit (INDEPENDENT_AMBULATORY_CARE_PROVIDER_SITE_OTHER): Payer: Medicare Other | Admitting: Family Medicine

## 2020-08-08 VITALS — BP 119/77 | HR 86 | Wt 227.0 lb

## 2020-08-08 DIAGNOSIS — E119 Type 2 diabetes mellitus without complications: Secondary | ICD-10-CM

## 2020-08-08 DIAGNOSIS — N898 Other specified noninflammatory disorders of vagina: Secondary | ICD-10-CM | POA: Diagnosis not present

## 2020-08-08 DIAGNOSIS — R8290 Unspecified abnormal findings in urine: Secondary | ICD-10-CM

## 2020-08-08 LAB — POCT URINALYSIS DIP (MANUAL ENTRY)
Bilirubin, UA: NEGATIVE
Glucose, UA: NEGATIVE mg/dL
Ketones, POC UA: NEGATIVE mg/dL
Leukocytes, UA: NEGATIVE
Nitrite, UA: NEGATIVE
Protein Ur, POC: NEGATIVE mg/dL
Spec Grav, UA: 1.025 (ref 1.010–1.025)
Urobilinogen, UA: 1 E.U./dL
pH, UA: 5.5 (ref 5.0–8.0)

## 2020-08-08 LAB — POCT WET PREP (WET MOUNT)
Clue Cells Wet Prep Whiff POC: NEGATIVE
Trichomonas Wet Prep HPF POC: ABSENT

## 2020-08-08 LAB — POCT UA - MICROSCOPIC ONLY

## 2020-08-08 LAB — POCT GLYCOSYLATED HEMOGLOBIN (HGB A1C): HbA1c, POC (controlled diabetic range): 5.7 % (ref 0.0–7.0)

## 2020-08-08 NOTE — Progress Notes (Signed)
    SUBJECTIVE:   CHIEF COMPLAINT / HPI:   Recent UTI Patient reports that about 1.5 months ago she had a UTI and was treated with unknown antibiotic (describes it as a yellow pill).  Denies increased frequency or burning with urination.  Concerned about having recurrent UTI and wanted to get tested today.  Vaginal discharge Patient reports vaginal discharge for the last several weeks with a somewhat fishy smell and describes the color as a clear/white/gray.  Has had unprotected sex about 3 weeks ago.  Denies vaginal irritation and pruritus.  PERTINENT  PMH / PSH: HTN, obesity, IDA, diverticulosis, prediabetes  OBJECTIVE:   BP 119/77   Pulse 86   Wt 227 lb (103 kg)   SpO2 99%   BMI 34.52 kg/m   Gen: well-appearing, NAD CV: RRR, no m/r/g appreciated, no peripheral edema Pulm: CTAB, no wheezes/crackles GI: soft, non-tender, non-distended Pelvic: vaginal walls pink and moist without lesions, cervix non-friable. Chaperone Teshira Leggette CNMA  ASSESSMENT/PLAN:   Vaginal discharge Vaginal discharge described concerning for possible STI versus BV versus possible normal discharge. -Wet mount and GC/CH collected today, will call patient with results -Wet mount resulted with no sign of BV, trichomoniasis, yeast -Patient instructed to follow-up if worsening vaginal discharge or concerning lesions appear.  Recent UTI Patient requested repeat UA to ensure no continued UTI. -UA dipstick and micro collected -Trace blood on dipstick, 0-1 RBC on microscopy  -No signs of bacteria or infection  Prediabetes HbA1c 5.7, patient is currently doing well and A1c improved from 6.1 in 11/21.   Rise Patience, [place]

## 2020-08-08 NOTE — Patient Instructions (Addendum)
It was so great meeting you today!  Today we have done an exam for infections in your pelvic area and have checked a urine sample. If there are any issues or if any antibiotics need to be prescribed I will call you with the results and send in the prescriptions if necessary. Your A1c today was 5.7, which is great!

## 2020-08-11 LAB — CERVICOVAGINAL ANCILLARY ONLY
Chlamydia: NEGATIVE
Comment: NEGATIVE
Comment: NORMAL
Neisseria Gonorrhea: NEGATIVE

## 2020-08-13 ENCOUNTER — Telehealth: Payer: Self-pay | Admitting: Family Medicine

## 2020-08-13 NOTE — Telephone Encounter (Signed)
Patient is returning a call from someone at our office. She isnt sure who called and I didn't see any documentation. She believes it is concerning her last appointment.   Please call patients back to discuss. The best call back is [phone].

## 2020-08-13 NOTE — Telephone Encounter (Signed)
Routing to PCP and doctor who last saw patient. Ebony Rickel Zimmerman Rumple, CMA

## 2020-08-26 ENCOUNTER — Other Ambulatory Visit: Payer: Self-pay | Admitting: Nurse Practitioner

## 2020-08-26 DIAGNOSIS — Z1231 Encounter for screening mammogram for malignant neoplasm of breast: Secondary | ICD-10-CM

## 2020-08-28 DIAGNOSIS — G8929 Other chronic pain: Secondary | ICD-10-CM | POA: Diagnosis not present

## 2020-08-28 DIAGNOSIS — Z79899 Other long term (current) drug therapy: Secondary | ICD-10-CM | POA: Diagnosis not present

## 2020-08-28 DIAGNOSIS — M5442 Lumbago with sciatica, left side: Secondary | ICD-10-CM | POA: Diagnosis not present

## 2020-08-28 DIAGNOSIS — Z9181 History of falling: Secondary | ICD-10-CM | POA: Diagnosis not present

## 2020-08-28 DIAGNOSIS — R892 Abnormal level of other drugs, medicaments and biological substances in specimens from other organs, systems and tissues: Secondary | ICD-10-CM | POA: Diagnosis not present

## 2020-08-28 DIAGNOSIS — M25532 Pain in left wrist: Secondary | ICD-10-CM | POA: Diagnosis not present

## 2020-09-17 DIAGNOSIS — M5442 Lumbago with sciatica, left side: Secondary | ICD-10-CM | POA: Diagnosis not present

## 2020-09-17 DIAGNOSIS — Z9181 History of falling: Secondary | ICD-10-CM | POA: Diagnosis not present

## 2020-09-17 DIAGNOSIS — G8929 Other chronic pain: Secondary | ICD-10-CM | POA: Diagnosis not present

## 2020-09-17 DIAGNOSIS — Z79899 Other long term (current) drug therapy: Secondary | ICD-10-CM | POA: Diagnosis not present

## 2020-09-17 DIAGNOSIS — F32A Depression, unspecified: Secondary | ICD-10-CM | POA: Diagnosis not present

## 2020-09-17 DIAGNOSIS — M25532 Pain in left wrist: Secondary | ICD-10-CM | POA: Diagnosis not present

## 2020-09-22 DIAGNOSIS — Z79899 Other long term (current) drug therapy: Secondary | ICD-10-CM | POA: Diagnosis not present

## 2020-10-06 ENCOUNTER — Ambulatory Visit
Admission: EM | Admit: 2020-10-06 | Discharge: 2020-10-06 | Disposition: A | Discharge status: Home / Self Care | Payer: Medicare Other

## 2020-10-06 ENCOUNTER — Other Ambulatory Visit: Payer: Self-pay

## 2020-10-06 DIAGNOSIS — R52 Pain, unspecified: Secondary | ICD-10-CM | POA: Diagnosis not present

## 2020-10-06 DIAGNOSIS — J069 Acute upper respiratory infection, unspecified: Secondary | ICD-10-CM | POA: Diagnosis not present

## 2020-10-06 DIAGNOSIS — Z20822 Contact with and (suspected) exposure to covid-19: Secondary | ICD-10-CM

## 2020-10-06 MED ORDER — BENZONATATE 200 MG PO CAPS: 200.0000 mg | ORAL_CAPSULE | Freq: Three times a day (TID) | ORAL | 0 refills | Status: AC | PRN

## 2020-10-06 MED ORDER — IBUPROFEN 800 MG PO TABS: 800.0000 mg | ORAL_TABLET | Freq: Three times a day (TID) | ORAL | 0 refills | Status: DC

## 2020-10-06 NOTE — ED Provider Notes (Signed)
[facility]    CSN: [id number] Arrival date & time: 10/06/20  1510      History   Chief Complaint Chief Complaint  Patient presents with   Headache   Generalized Body Aches    HPI Norma [name] is a 60 y.o. female history of DM type II, hypertension, presenting today for evaluation of body aches and URI symptoms.  Reports over Norma past 1 to 2 days has developed body aches, generalized fatigue, ear pain, congestion and drainage, sore throat cough as well as abdominal discomfort.  She denies any nausea or vomiting.  Denies diarrhea.  [name] here with similar symptoms.  HPI  Past Medical History:  Diagnosis Date   A-fib ([place])    Acid reflux    Allergy    Anemia    Anxiety    Arthritis    Back pain    Depression    Diabetes mellitus without complication ([place])    on meds   Dysrhythmia    Headache    stress headaches, migraines at times   Heart murmur    Hypertension    Irregular heart beat    Left shoulder pain    Shortness of breath dyspnea    with exertion   Tachyarrhythmia    Trigger point of thoracic region 03/22/2012    Patient Active Problem List   Diagnosis Date Noted   Tendinopathy of right rotator cuff 04/04/2020   HCV antibody positive 03/18/2020   RUQ pain 33/82/5053   Biliary colic symptom    Elevated liver enzymes    Abnormal findings on diagnostic imaging of liver and biliary tract    Abdominal pain 03/16/2020   Cholelithiasis 03/16/2020   Hypertensive urgency 03/16/2020   Type 2 diabetes mellitus ([place]) 03/16/2020   Lead exposure 11/14/2019   TMJ (temporomandibular joint disorder) 07/31/2019   Left hip pain 02/27/2019   Uterine mass 12/19/2018   Allergic rhinitis with postnasal drip 11/17/2018   Diverticulosis of colon without hemorrhage 06/23/2018   Intractable migraine without aura and without status migrainosus 02/16/2018   Iron deficiency anemia 03/29/2017   Vitamin D deficiency 03/01/2017   Lumbar herniated disc  06/06/2014   Depression    Prediabetes 01/14/2014   Spinal stenosis, lumbar region, with neurogenic claudication 11/21/2013   Hot flashes 06/30/2011   Eczema, dyshidrotic 06/02/2010   FIBROIDS, UTERUS 10/24/2009   GERD 06/09/2009   Chronic pain syndrome 10/04/2008   Hepatitis C carrier ([place]) 03/21/2007   Obesity 06/16/2006   HYPERTENSION, BENIGN SYSTEMIC 06/16/2006    Past Surgical History:  Procedure Laterality Date   ANKLE SURGERY     CHOLECYSTECTOMY N/A 03/20/2020   Procedure: LAPAROSCOPIC CHOLECYSTECTOMY WITH INTRAOPERATIVE CHOLANGIOGRAM AND WEDGE LIVER BIOPSY;  Surgeon: [name], MD;  Location: [facility];  Service: General;  Laterality: N/A;   KNEE SURGERY Left    LUMBAR LAMINECTOMY/DECOMPRESSION MICRODISCECTOMY Left 06/06/2014   Procedure: LUMBAR LAMINECTOMY/DECOMPRESSION MICRODISCECTOMY 1 LEVEL;  Surgeon: Newman Pies, MD;  Location: [place] [facility];  Service: Neurosurgery;  Laterality: Left;  Left L5S1 microdiskectomy   ROTATOR CUFF REPAIR Left 02/20/14   TUBAL LIGATION      OB History     Gravida  4   Para  4   Term  4   Preterm      AB  0   Living  4      SAB  0   IAB  0   Ectopic  0   Multiple  0   Live Births  4            Home Medications    Prior to Admission medications   Medication Sig Start Date End Date Taking? Authorizing Provider  benzonatate (TESSALON) 200 MG capsule Take 1 capsule (200 mg total) by mouth 3 (three) times daily as needed for up to 7 days for cough. 10/06/20 10/13/20 Yes Young Brim C, PA-C  ibuprofen (ADVIL) 800 MG tablet Take 1 tablet (800 mg total) by mouth 3 (three) times daily. 10/06/20  Yes Brennen Camper C, PA-C  buPROPion (WELLBUTRIN XL) 300 MG 24 hr tablet Take 300 mg by mouth daily.    [provider]  cetirizine (ZYRTEC ALLERGY) 10 MG tablet Take 1 tablet (10 mg total) by mouth daily. 01/30/20   Hall-Potvin, [place], PA-C  cyclobenzaprine (FLEXERIL) 10 MG tablet TAKE 1 TABLET BY MOUTH TWICE A  DAY AS NEEDED FOR MUSCLE SPASMS 12/23/17   Meccariello, Bernita Raisin, DO  diclofenac Sodium (VOLTAREN) 1 % GEL Please apply 1-2 times daily to affected area 10/09/19   Lockamy, Timothy, DO  EPINEPHrine 0.3 mg/0.3 mL IJ SOAJ injection Inject into Norma muscle as directed. 07/22/20   [provider]  fluticasone (FLONASE) 50 MCG/ACT nasal spray Place 2 sprays into both nostrils daily. 10/15/19   Meccariello, Bernita Raisin, DO  fluticasone (FLONASE) 50 MCG/ACT nasal spray Place 1 spray into both nostrils daily. 01/30/20   Hall-Potvin, [place], PA-C  gabapentin (NEURONTIN) 100 MG capsule Take 1 capsule (100 mg total) by mouth at bedtime. 05/25/19   Meccariello, Bernita Raisin, DO  gabapentin (NEURONTIN) 300 MG capsule Take 1 capsule by mouth 3 (three) times daily. 09/25/20   [provider]  hydrochlorothiazide (HYDRODIURIL) 25 MG tablet Take 1 tablet (25 mg total) by mouth daily. 11/02/16   Everrett Coombe, MD  losartan (COZAAR) 50 MG tablet Take 50 mg by mouth daily. 01/24/20   [provider]  metFORMIN (GLUCOPHAGE) 500 MG tablet Take 500 mg by mouth 2 (two) times daily. 12/17/19   [provider]  metoprolol tartrate (LOPRESSOR) 25 MG tablet Take 1 tablet (25 mg total) by mouth 2 (two) times daily. 05/13/14   [name], MD  multivitamin-iron-minerals-folic acid (CENTRUM) chewable tablet Chew 1 tablet daily by mouth. 03/01/17   [name], MD  omeprazole (PRILOSEC) 20 MG capsule Take 2 capsules (40 mg total) by mouth daily. 05/25/19   Meccariello, Bernita Raisin, DO  oxyCODONE-acetaminophen (PERCOCET) 10-325 MG tablet Take 1 tablet by mouth 3 (three) times daily. 07/23/19   [provider]  potassium chloride (KLOR-CON) 10 MEQ tablet Take 10 mEq by mouth 2 (two) times daily. 01/24/20   [provider]  traZODone (DESYREL) 100 MG tablet TAKE 2 TABLETS BY MOUTH ONCE AT BEDTIME 05/31/19   Meccariello, Bernita Raisin, DO  Vitamin D, Ergocalciferol, (DRISDOL) 50000 units CAPS capsule TAKE  1 CAPSULE BY MOUTH EVERY 7 (SEVEN) DAYS. 12/09/17   Meccariello, Bernita Raisin, DO  ferrous sulfate 325 (65 FE) MG tablet Take 1 tablet (325 mg total) by mouth 2 (two) times daily with a meal. 02/22/19 04/14/19  Shirley, [place], DO  loratadine (CLARITIN) 10 MG tablet Take 1 tablet (10 mg total) by mouth daily. 04/11/19 08/12/19  Meccariello, Bernita Raisin, DO    Family History Family History  Problem Relation Age of Onset   Diabetes Brother    Diabetes Maternal Aunt    Diabetes Paternal Aunt    Diabetes Maternal Grandmother    Diabetes Maternal Grandfather    Diabetes  Paternal Grandmother    Diabetes Paternal Grandfather    Congestive Heart Failure Mother    Hypertension Son    Colon cancer Neg Hx    Esophageal cancer Neg Hx    Rectal cancer Neg Hx    Stomach cancer Neg Hx     Social History Social History   Tobacco Use   Smoking status: Never   Smokeless tobacco: Never  Vaping Use   Vaping Use: Never used  Substance Use Topics   Alcohol use: Yes    Comment: glass of wine occasionally   Drug use: No    Comment: States no longer uses marijuana     Allergies   Soma [carisoprodol], Nortriptyline, and Prednisone   Review of Systems Review of Systems  Constitutional:  Negative for activity change, appetite change, chills, fatigue and fever.  HENT:  Positive for congestion, ear pain and sore throat. Negative for rhinorrhea, sinus pressure and trouble swallowing.   Eyes:  Negative for discharge and redness.  Respiratory:  Positive for cough. Negative for chest tightness and shortness of breath.   Cardiovascular:  Negative for chest pain.  Gastrointestinal:  Positive for abdominal pain. Negative for diarrhea, nausea and vomiting.  Musculoskeletal:  Negative for myalgias.  Skin:  Negative for rash.  Neurological:  Negative for dizziness, light-headedness and headaches.    Physical Exam Triage Vital Signs ED Triage Vitals  Enc Vitals Group     BP 10/06/20 1702 117/80     Pulse  Rate 10/06/20 1702 88     Resp 10/06/20 1702 18     Temp 10/06/20 1702 98.3 F (36.8 C)     Temp Source 10/06/20 1702 Oral     SpO2 10/06/20 1702 94 %     Weight --      Height --      Head Circumference --      Peak Flow --      Pain Score 10/06/20 1705 7     Pain Loc --      Pain Edu? --      Excl. in [place]? --    No data found.  Updated Vital Signs BP 117/80 (BP Location: Right Arm)   Pulse 88   Temp 98.3 F (36.8 C) (Oral)   Resp 18   SpO2 94%   Visual Acuity Right Eye Distance:   Left Eye Distance:   Bilateral Distance:    Right Eye Near:   Left Eye Near:    Bilateral Near:     Physical Exam Vitals and nursing note reviewed.  Constitutional:      Appearance: She is well-developed.     Comments: No acute distress  HENT:     Head: Normocephalic and atraumatic.     Ears:     Comments: Bilateral ears without tenderness to palpation of external auricle, tragus and mastoid, EAC's without erythema or swelling, TM's with good bony landmarks and cone of light. Non erythematous.      Nose: Nose normal.     Mouth/Throat:     Comments: Oral mucosa pink and moist, no tonsillar enlargement or exudate. Posterior pharynx patent and nonerythematous, no uvula deviation or swelling. Normal phonation.  Eyes:     Conjunctiva/sclera: Conjunctivae normal.  Cardiovascular:     Rate and Rhythm: Normal rate.  Pulmonary:     Effort: Pulmonary effort is normal. No respiratory distress.     Comments: Breathing comfortably at rest, CTABL, no wheezing, rales or other adventitious sounds auscultated  Abdominal:  General: There is no distension.  Musculoskeletal:        General: Normal range of motion.     Cervical back: Neck supple.  Skin:    General: Skin is warm and dry.  Neurological:     Mental Status: She is alert and oriented to person, place, and time.     UC Treatments / Results  Labs (all labs ordered are listed, but only abnormal results are displayed) Labs  Reviewed  NOVEL CORONAVIRUS, NAA    EKG   Radiology No results found.  Procedures Procedures (including critical care time)  Medications Ordered in UC Medications - No data to display  Initial Impression / Assessment and Plan / UC Course  I have reviewed Norma triage vital signs and Norma nursing notes.  Pertinent labs & imaging results that were available during my care of Norma patient were reviewed by me and considered in my medical decision making (see chart for details).     Viral URI with cough-COVID test pending; no sign of ear infection, likely underlying eustachian tube dysfunction from sinus inflammation, initiate on Flonase and daily antihistamine, may supplement with Mucinex for further relief of congestion, Tessalon for cough, rest and fluids.  Discussed strict return precautions. Patient verbalized understanding and is agreeable with plan.  Final Clinical Impressions(s) / UC Diagnoses   Final diagnoses:  Encounter for screening laboratory testing for COVID-19 virus  Viral URI with cough  Body aches     Discharge Instructions      Begin Flonase nasal spray May try loratadine/Claritin or fexofenadine/Allegra as alternative to Norma cetirizine Over-Norma-counter Mucinex as needed for further congestion/drainage relief Tessalon every 8 hours for cough Tylenol and ibuprofen for ear pain, headache, body aches Rest and fluids Follow-up if not improving or worsening     ED Prescriptions     Medication Sig Dispense Auth. Provider   benzonatate (TESSALON) 200 MG capsule Take 1 capsule (200 mg total) by mouth 3 (three) times daily as needed for up to 7 days for cough. 28 capsule Molly Savarino C, PA-C   ibuprofen (ADVIL) 800 MG tablet Take 1 tablet (800 mg total) by mouth 3 (three) times daily. 21 tablet Bridget Westbrooks, [place] C, PA-C      PDMP not reviewed this encounter.   Janith Lima, [place] 10/06/20 1759

## 2020-10-06 NOTE — Discharge Instructions (Addendum)
Begin Flonase nasal spray May try loratadine/Claritin or fexofenadine/Allegra as alternative to the cetirizine Over-the-counter Mucinex as needed for further congestion/drainage relief Tessalon every 8 hours for cough Tylenol and ibuprofen for ear pain, headache, body aches Rest and fluids Follow-up if not improving or worsening

## 2020-10-06 NOTE — ED Triage Notes (Signed)
Yesterday morning patient reports that she woke up with a HA, general body aches, fatigue, cough and rhinorrhea Has been taking Tylenol with relief for her HA. Also taking Nyquil and Zyrtec without relief. No n/v/d.

## 2020-10-08 LAB — NOVEL CORONAVIRUS, NAA

## 2020-10-13 ENCOUNTER — Ambulatory Visit (HOSPITAL_COMMUNITY)
Admission: EM | Admit: 2020-10-13 | Discharge: 2020-10-13 | Disposition: A | Discharge status: Home / Self Care | Payer: Medicare Other | Attending: Internal Medicine | Admitting: Internal Medicine

## 2020-10-13 ENCOUNTER — Other Ambulatory Visit: Payer: Self-pay

## 2020-10-13 DIAGNOSIS — Z20822 Contact with and (suspected) exposure to covid-19: Secondary | ICD-10-CM | POA: Diagnosis not present

## 2020-10-13 NOTE — ED Triage Notes (Signed)
Pt presents for COVID re-test per phone call with nurse.

## 2020-10-14 LAB — SARS CORONAVIRUS 2 (TAT 6-24 HRS): SARS Coronavirus 2: NEGATIVE

## 2020-10-21 ENCOUNTER — Other Ambulatory Visit: Payer: Self-pay

## 2020-10-21 ENCOUNTER — Ambulatory Visit
Admission: RE | Admit: 2020-10-21 | Discharge: 2020-10-21 | Disposition: A | Discharge status: Home / Self Care | Payer: Medicare Other | Source: Ambulatory Visit | Attending: Nurse Practitioner | Admitting: Nurse Practitioner

## 2020-10-21 DIAGNOSIS — Z1231 Encounter for screening mammogram for malignant neoplasm of breast: Secondary | ICD-10-CM | POA: Diagnosis not present

## 2020-10-24 ENCOUNTER — Other Ambulatory Visit: Payer: Self-pay | Admitting: Nurse Practitioner

## 2020-10-24 DIAGNOSIS — R928 Other abnormal and inconclusive findings on diagnostic imaging of breast: Secondary | ICD-10-CM

## 2020-10-29 DIAGNOSIS — Z9181 History of falling: Secondary | ICD-10-CM | POA: Diagnosis not present

## 2020-10-29 DIAGNOSIS — Z79899 Other long term (current) drug therapy: Secondary | ICD-10-CM | POA: Diagnosis not present

## 2020-10-29 DIAGNOSIS — M25511 Pain in right shoulder: Secondary | ICD-10-CM | POA: Diagnosis not present

## 2020-10-29 DIAGNOSIS — F32A Depression, unspecified: Secondary | ICD-10-CM | POA: Diagnosis not present

## 2020-10-29 DIAGNOSIS — M25532 Pain in left wrist: Secondary | ICD-10-CM | POA: Diagnosis not present

## 2020-10-29 DIAGNOSIS — M5442 Lumbago with sciatica, left side: Secondary | ICD-10-CM | POA: Diagnosis not present

## 2020-10-29 DIAGNOSIS — G8929 Other chronic pain: Secondary | ICD-10-CM | POA: Diagnosis not present

## 2020-10-30 ENCOUNTER — Ambulatory Visit: Payer: Medicare Other

## 2020-10-31 DIAGNOSIS — Z79899 Other long term (current) drug therapy: Secondary | ICD-10-CM | POA: Diagnosis not present

## 2020-11-03 DIAGNOSIS — H35033 Hypertensive retinopathy, bilateral: Secondary | ICD-10-CM | POA: Diagnosis not present

## 2020-11-03 DIAGNOSIS — E119 Type 2 diabetes mellitus without complications: Secondary | ICD-10-CM | POA: Diagnosis not present

## 2020-11-03 DIAGNOSIS — H25813 Combined forms of age-related cataract, bilateral: Secondary | ICD-10-CM | POA: Diagnosis not present

## 2020-11-03 DIAGNOSIS — H40013 Open angle with borderline findings, low risk, bilateral: Secondary | ICD-10-CM | POA: Diagnosis not present

## 2020-11-03 DIAGNOSIS — I10 Essential (primary) hypertension: Secondary | ICD-10-CM | POA: Diagnosis not present

## 2020-11-03 DIAGNOSIS — H524 Presbyopia: Secondary | ICD-10-CM | POA: Diagnosis not present

## 2020-11-03 LAB — HM DIABETES EYE EXAM

## 2020-11-05 DIAGNOSIS — M25511 Pain in right shoulder: Secondary | ICD-10-CM | POA: Diagnosis not present

## 2020-11-05 DIAGNOSIS — R1031 Right lower quadrant pain: Secondary | ICD-10-CM | POA: Diagnosis not present

## 2020-11-07 ENCOUNTER — Other Ambulatory Visit: Payer: Self-pay

## 2020-11-07 ENCOUNTER — Ambulatory Visit
Admission: EM | Admit: 2020-11-07 | Discharge: 2020-11-07 | Disposition: A | Discharge status: Home / Self Care | Payer: Medicare Other | Attending: Student | Admitting: Student

## 2020-11-07 DIAGNOSIS — R590 Localized enlarged lymph nodes: Secondary | ICD-10-CM | POA: Diagnosis not present

## 2020-11-07 DIAGNOSIS — J301 Allergic rhinitis due to pollen: Secondary | ICD-10-CM | POA: Diagnosis not present

## 2020-11-07 MED ORDER — CETIRIZINE HCL 10 MG PO TABS: 10.0000 mg | ORAL_TABLET | Freq: Every day | ORAL | 0 refills | Status: AC

## 2020-11-07 MED ORDER — IBUPROFEN 600 MG PO TABS: 600.0000 mg | ORAL_TABLET | Freq: Four times a day (QID) | ORAL | 0 refills | Status: DC | PRN

## 2020-11-07 NOTE — Discharge Instructions (Addendum)
-  Zyrtec (Cetirizine) once daily for at least 7 days -Ibuprofen '600mg'$  with breakfast x5 days -We'll call you if the labwork is abnormal -Follow-up with PCP if symptoms persist >3 weeks

## 2020-11-07 NOTE — ED Triage Notes (Signed)
Two day h/o neck swelling and pain. Pt reports at the onset when she woke up in the morning she felt two "knots like goose eggs" on each side of her neck. Notes that her neck feels warm to the tough. Denies dysphagia. No abdominal pain and emesis. Has been taking tylenol and rubbing alcohol on the area with some relief.

## 2020-11-07 NOTE — ED Provider Notes (Signed)
[facility]    CSN: [id number] Arrival date & time: 11/07/20  1200      History   Chief Complaint Chief Complaint  Patient presents with   Neck Pain   neck swelling    HPI Norma [name] is a 60 y.o. female presenting with lymphadenopathy. Medical histoiry afib, GERD, arthritis, diabetes, etc. States she woke up 3 days ago with cervical lymphadenopathy, tender. Worse in the morning and improves throughout the day.feeling well otherwise- denies shortness of breath, trouble swallowing/dysphagia, sore throat, fevers/chills, cough, weakness, chest pain, shortness of breath. Does note untreated allergic rhinitis.     HPI  Past Medical History:  Diagnosis Date   A-fib ([place])    Acid reflux    Allergy    Anemia    Anxiety    Arthritis    Back pain    Depression    Diabetes mellitus without complication ([place])    on meds   Dysrhythmia    Headache    stress headaches, migraines at times   Heart murmur    Hypertension    Irregular heart beat    Left shoulder pain    Shortness of breath dyspnea    with exertion   Tachyarrhythmia    Trigger point of thoracic region 03/22/2012    Patient Active Problem List   Diagnosis Date Noted   Tendinopathy of right rotator cuff 04/04/2020   HCV antibody positive 03/18/2020   RUQ pain [id number]   Biliary colic symptom    Elevated liver enzymes    Abnormal findings on diagnostic imaging of liver and biliary tract    Abdominal pain 03/16/2020   Cholelithiasis 03/16/2020   Hypertensive urgency 03/16/2020   Type 2 diabetes mellitus ([place]) 03/16/2020   Lead exposure 11/14/2019   TMJ (temporomandibular joint disorder) 07/31/2019   Left hip pain 02/27/2019   Uterine mass 12/19/2018   Allergic rhinitis with postnasal drip 11/17/2018   Diverticulosis of colon without hemorrhage 06/23/2018   Intractable migraine without aura and without status migrainosus 02/16/2018   Iron deficiency anemia 03/29/2017   Vitamin D deficiency  03/01/2017   Lumbar herniated disc 06/06/2014   Depression    Prediabetes 01/14/2014   Spinal stenosis, lumbar region, with neurogenic claudication 11/21/2013   Hot flashes 06/30/2011   Eczema, dyshidrotic 06/02/2010   FIBROIDS, UTERUS 10/24/2009   GERD 06/09/2009   Chronic pain syndrome 10/04/2008   Hepatitis C carrier ([place]) 03/21/2007   Obesity 06/16/2006   HYPERTENSION, BENIGN SYSTEMIC 06/16/2006    Past Surgical History:  Procedure Laterality Date   ANKLE SURGERY     CHOLECYSTECTOMY N/A 03/20/2020   Procedure: LAPAROSCOPIC CHOLECYSTECTOMY WITH INTRAOPERATIVE CHOLANGIOGRAM AND WEDGE LIVER BIOPSY;  Surgeon: [name], MD;  Location: [facility];  Service: General;  Laterality: N/A;   KNEE SURGERY Left    LUMBAR LAMINECTOMY/DECOMPRESSION MICRODISCECTOMY Left 06/06/2014   Procedure: LUMBAR LAMINECTOMY/DECOMPRESSION MICRODISCECTOMY 1 LEVEL;  Surgeon: Newman Pies, MD;  Location: [place] [facility];  Service: Neurosurgery;  Laterality: Left;  Left L5S1 microdiskectomy   ROTATOR CUFF REPAIR Left 02/20/14   TUBAL LIGATION      OB History     Gravida  4   Para  4   Term  4   Preterm      AB  0   Living  4      SAB  0   IAB  0   Ectopic  0   Multiple  0   Live Births  4  Home Medications    Prior to Admission medications   Medication Sig Start Date End Date Taking? Authorizing Provider  ibuprofen (ADVIL) 600 MG tablet Take 1 tablet (600 mg total) by mouth every 6 (six) hours as needed. Take with breakfast for 5 days. Can continue for longer if symptoms persist. 11/07/20  Yes Hazel Sams, PA-C  buPROPion (WELLBUTRIN XL) 300 MG 24 hr tablet Take 300 mg by mouth daily.    [provider]  cetirizine (ZYRTEC ALLERGY) 10 MG tablet Take 1 tablet (10 mg total) by mouth daily. 11/07/20   Hazel Sams, PA-C  cyclobenzaprine (FLEXERIL) 10 MG tablet TAKE 1 TABLET BY MOUTH TWICE A DAY AS NEEDED FOR MUSCLE SPASMS 12/23/17   Meccariello, Bernita Raisin, DO   diclofenac Sodium (VOLTAREN) 1 % GEL Please apply 1-2 times daily to affected area 10/09/19   Lockamy, Timothy, DO  EPINEPHrine 0.3 mg/0.3 mL IJ SOAJ injection Inject into the muscle as directed. 07/22/20   [provider]  fluticasone (FLONASE) 50 MCG/ACT nasal spray Place 2 sprays into both nostrils daily. 10/15/19   Meccariello, Bernita Raisin, DO  fluticasone (FLONASE) 50 MCG/ACT nasal spray Place 1 spray into both nostrils daily. 01/30/20   Hall-Potvin, [place], PA-C  gabapentin (NEURONTIN) 100 MG capsule Take 1 capsule (100 mg total) by mouth at bedtime. 05/25/19   Meccariello, Bernita Raisin, DO  gabapentin (NEURONTIN) 300 MG capsule Take 1 capsule by mouth 3 (three) times daily. 09/25/20   [provider]  hydrochlorothiazide (HYDRODIURIL) 25 MG tablet Take 1 tablet (25 mg total) by mouth daily. 11/02/16   Everrett Coombe, MD  losartan (COZAAR) 50 MG tablet Take 50 mg by mouth daily. 01/24/20   [provider]  metFORMIN (GLUCOPHAGE) 500 MG tablet Take 500 mg by mouth 2 (two) times daily. 12/17/19   [provider]  metoprolol tartrate (LOPRESSOR) 25 MG tablet Take 1 tablet (25 mg total) by mouth 2 (two) times daily. 05/13/14   [name], MD  multivitamin-iron-minerals-folic acid (CENTRUM) chewable tablet Chew 1 tablet daily by mouth. 03/01/17   [name], MD  omeprazole (PRILOSEC) 20 MG capsule Take 2 capsules (40 mg total) by mouth daily. 05/25/19   Meccariello, Bernita Raisin, DO  oxyCODONE-acetaminophen (PERCOCET) 10-325 MG tablet Take 1 tablet by mouth 3 (three) times daily. 07/23/19   [provider]  potassium chloride (KLOR-CON) 10 MEQ tablet Take 10 mEq by mouth 2 (two) times daily. 01/24/20   [provider]  traZODone (DESYREL) 100 MG tablet TAKE 2 TABLETS BY MOUTH ONCE AT BEDTIME 05/31/19   Meccariello, Bernita Raisin, DO  Vitamin D, Ergocalciferol, (DRISDOL) 50000 units CAPS capsule TAKE 1 CAPSULE BY MOUTH EVERY 7 (SEVEN) DAYS. 12/09/17   Meccariello,  Bernita Raisin, DO  ferrous sulfate 325 (65 FE) MG tablet Take 1 tablet (325 mg total) by mouth 2 (two) times daily with a meal. 02/22/19 04/14/19  Shirley, [place], DO  loratadine (CLARITIN) 10 MG tablet Take 1 tablet (10 mg total) by mouth daily. 04/11/19 08/12/19  Meccariello, Bernita Raisin, DO    Family History Family History  Problem Relation Age of Onset   Diabetes Brother    Diabetes Maternal Aunt    Diabetes Paternal Aunt    Diabetes Maternal Grandmother    Diabetes Maternal Grandfather    Diabetes Paternal Grandmother    Diabetes Paternal Grandfather    Congestive Heart Failure Mother    Hypertension Son    Colon cancer Neg Hx  Esophageal cancer Neg Hx    Rectal cancer Neg Hx    Stomach cancer Neg Hx     Social History Social History   Tobacco Use   Smoking status: Never   Smokeless tobacco: Never  Vaping Use   Vaping Use: Never used  Substance Use Topics   Alcohol use: Yes    Comment: glass of wine occasionally   Drug use: No    Comment: States no longer uses marijuana     Allergies   Soma [carisoprodol], Nortriptyline, and Prednisone   Review of Systems Review of Systems  HENT:  Positive for congestion.   All other systems reviewed and are negative.   Physical Exam Triage Vital Signs ED Triage Vitals  Enc Vitals Group     BP 11/07/20 1434 (!) 146/94     Pulse Rate 11/07/20 1434 91     Resp 11/07/20 1434 18     Temp 11/07/20 1434 (!) 97.4 F (36.3 C)     Temp Source 11/07/20 1434 Oral     SpO2 11/07/20 1434 94 %     Weight --      Height --      Head Circumference --      Peak Flow --      Pain Score 11/07/20 1437 6     Pain Loc --      Pain Edu? --      Excl. in [place]? --    No data found.  Updated Vital Signs BP (!) 146/94 (BP Location: Left Arm)   Pulse 91   Temp (!) 97.4 F (36.3 C) (Oral)   Resp 18   SpO2 94%   Visual Acuity Right Eye Distance:   Left Eye Distance:   Bilateral Distance:    Right Eye Near:   Left Eye Near:     Bilateral Near:     Physical Exam Vitals reviewed.  Constitutional:      General: She is not in acute distress.    Appearance: Normal appearance. She is not ill-appearing or diaphoretic.  HENT:     Head: Normocephalic and atraumatic.  Neck:     Comments: Superficial cervical lympahdenopathy. Tender, mobile. No warmth or erythema. Cardiovascular:     Rate and Rhythm: Normal rate and regular rhythm.     Heart sounds: Normal heart sounds.  Pulmonary:     Effort: Pulmonary effort is normal.     Breath sounds: Normal breath sounds.  Lymphadenopathy:     Cervical: Cervical adenopathy present.     Right cervical: Superficial cervical adenopathy present.     Left cervical: Superficial cervical adenopathy present.  Skin:    General: Skin is warm.  Neurological:     General: No focal deficit present.     Mental Status: She is alert and oriented to person, place, and time.  Psychiatric:        Mood and Affect: Mood normal.        Behavior: Behavior normal.        Thought Content: Thought content normal.        Judgment: Judgment normal.     UC Treatments / Results  Labs (all labs ordered are listed, but only abnormal results are displayed) Labs Reviewed  CBC WITH DIFFERENTIAL/PLATELET    EKG   Radiology No results found.  Procedures Procedures (including critical care time)  Medications Ordered in UC Medications - No data to display  Initial Impression / Assessment and Plan / UC Course  I  have reviewed the triage vital signs and the nursing notes.  Pertinent labs & imaging results that were available during my care of the patient were reviewed by me and considered in my medical decision making (see chart for details).     This patient is a very pleasant 60 y.o. year old female presenting with cervical lymphadenopathy.  Afebrile, nontachycardic.  Feeling well otherwise, except for untreated allergic rhinitis.  Trial of Zyrtec and ibuprofen, planned to send prednisone  but she is allergic to this.  We will check a CBC with differential. ED return precautions discussed. Patient verbalizes understanding and agreement.  .   Final Clinical Impressions(s) / UC Diagnoses   Final diagnoses:  Cervical lymphadenopathy  Seasonal allergic rhinitis due to pollen     Discharge Instructions      -Zyrtec (Cetirizine) once daily for at least 7 days -Ibuprofen '600mg'$  with breakfast x5 days -We'll call you if the labwork is abnormal -Follow-up with PCP if symptoms persist >3 weeks     ED Prescriptions     Medication Sig Dispense Auth. Provider   cetirizine (ZYRTEC ALLERGY) 10 MG tablet Take 1 tablet (10 mg total) by mouth daily. 30 tablet Hazel Sams, PA-C   ibuprofen (ADVIL) 600 MG tablet Take 1 tablet (600 mg total) by mouth every 6 (six) hours as needed. Take with breakfast for 5 days. Can continue for longer if symptoms persist. 30 tablet Hazel Sams, PA-C      PDMP not reviewed this encounter.   Hazel Sams, PA-C 11/07/20 1500

## 2020-11-08 LAB — CBC WITH DIFFERENTIAL/PLATELET
Basophils Absolute: 0.1 10*3/uL (ref 0.0–0.2)
Basos: 1 %
EOS (ABSOLUTE): 0.2 10*3/uL (ref 0.0–0.4)
Eos: 3 %
Hematocrit: 38.8 % (ref 34.0–46.6)
Hemoglobin: 12.3 g/dL (ref 11.1–15.9)
Immature Grans (Abs): 0 10*3/uL (ref 0.0–0.1)
Immature Granulocytes: 0 %
Lymphocytes Absolute: 3 10*3/uL (ref 0.7–3.1)
Lymphs: 46 %
MCH: 26.9 pg (ref 26.6–33.0)
MCHC: 31.7 g/dL (ref 31.5–35.7)
MCV: 85 fL (ref 79–97)
Monocytes Absolute: 0.5 10*3/uL (ref 0.1–0.9)
Monocytes: 7 %
Neutrophils Absolute: 2.8 10*3/uL (ref 1.4–7.0)
Neutrophils: 43 %
Platelets: 234 10*3/uL (ref 150–450)
RBC: 4.58 x10E6/uL (ref 3.77–5.28)
RDW: 14.2 % (ref 11.7–15.4)
WBC: 6.4 10*3/uL (ref 3.4–10.8)

## 2020-11-11 ENCOUNTER — Other Ambulatory Visit: Payer: Self-pay | Admitting: Nurse Practitioner

## 2020-11-11 ENCOUNTER — Other Ambulatory Visit: Payer: Self-pay | Admitting: Student

## 2020-11-11 DIAGNOSIS — R928 Other abnormal and inconclusive findings on diagnostic imaging of breast: Secondary | ICD-10-CM

## 2020-11-12 ENCOUNTER — Other Ambulatory Visit: Payer: Self-pay

## 2020-11-12 ENCOUNTER — Ambulatory Visit
Admission: RE | Admit: 2020-11-12 | Discharge: 2020-11-12 | Disposition: A | Discharge status: Home / Self Care | Payer: Medicare Other | Source: Ambulatory Visit | Attending: Nurse Practitioner | Admitting: Nurse Practitioner

## 2020-11-12 DIAGNOSIS — R928 Other abnormal and inconclusive findings on diagnostic imaging of breast: Secondary | ICD-10-CM

## 2020-11-12 DIAGNOSIS — R922 Inconclusive mammogram: Secondary | ICD-10-CM | POA: Diagnosis not present

## 2020-11-18 DIAGNOSIS — I10 Essential (primary) hypertension: Secondary | ICD-10-CM | POA: Diagnosis not present

## 2020-11-18 DIAGNOSIS — J302 Other seasonal allergic rhinitis: Secondary | ICD-10-CM | POA: Diagnosis not present

## 2020-11-18 DIAGNOSIS — F32A Depression, unspecified: Secondary | ICD-10-CM | POA: Diagnosis not present

## 2020-11-18 DIAGNOSIS — M129 Arthropathy, unspecified: Secondary | ICD-10-CM | POA: Diagnosis not present

## 2020-11-18 DIAGNOSIS — I4891 Unspecified atrial fibrillation: Secondary | ICD-10-CM | POA: Diagnosis not present

## 2020-11-18 DIAGNOSIS — Z79899 Other long term (current) drug therapy: Secondary | ICD-10-CM | POA: Diagnosis not present

## 2020-11-18 DIAGNOSIS — E559 Vitamin D deficiency, unspecified: Secondary | ICD-10-CM | POA: Diagnosis not present

## 2020-11-18 DIAGNOSIS — D539 Nutritional anemia, unspecified: Secondary | ICD-10-CM | POA: Diagnosis not present

## 2020-11-18 DIAGNOSIS — E119 Type 2 diabetes mellitus without complications: Secondary | ICD-10-CM | POA: Diagnosis not present

## 2020-11-18 DIAGNOSIS — R5383 Other fatigue: Secondary | ICD-10-CM | POA: Diagnosis not present

## 2020-11-18 DIAGNOSIS — Z9181 History of falling: Secondary | ICD-10-CM | POA: Diagnosis not present

## 2020-11-18 DIAGNOSIS — Z1159 Encounter for screening for other viral diseases: Secondary | ICD-10-CM | POA: Diagnosis not present

## 2020-11-18 DIAGNOSIS — E7800 Pure hypercholesterolemia, unspecified: Secondary | ICD-10-CM | POA: Diagnosis not present

## 2020-11-28 DIAGNOSIS — M25562 Pain in left knee: Secondary | ICD-10-CM | POA: Diagnosis not present

## 2020-11-28 DIAGNOSIS — M545 Low back pain, unspecified: Secondary | ICD-10-CM | POA: Diagnosis not present

## 2020-11-28 DIAGNOSIS — F32A Depression, unspecified: Secondary | ICD-10-CM | POA: Diagnosis not present

## 2020-11-28 DIAGNOSIS — M25511 Pain in right shoulder: Secondary | ICD-10-CM | POA: Diagnosis not present

## 2020-11-28 DIAGNOSIS — M5442 Lumbago with sciatica, left side: Secondary | ICD-10-CM | POA: Diagnosis not present

## 2020-11-28 DIAGNOSIS — M25532 Pain in left wrist: Secondary | ICD-10-CM | POA: Diagnosis not present

## 2020-11-28 DIAGNOSIS — M25561 Pain in right knee: Secondary | ICD-10-CM | POA: Diagnosis not present

## 2020-11-28 DIAGNOSIS — M546 Pain in thoracic spine: Secondary | ICD-10-CM | POA: Diagnosis not present

## 2020-11-28 DIAGNOSIS — Z9181 History of falling: Secondary | ICD-10-CM | POA: Diagnosis not present

## 2020-11-28 DIAGNOSIS — G8929 Other chronic pain: Secondary | ICD-10-CM | POA: Diagnosis not present

## 2020-11-28 DIAGNOSIS — Z79899 Other long term (current) drug therapy: Secondary | ICD-10-CM | POA: Diagnosis not present

## 2020-12-02 DIAGNOSIS — M67911 Unspecified disorder of synovium and tendon, right shoulder: Secondary | ICD-10-CM | POA: Diagnosis not present

## 2020-12-02 DIAGNOSIS — M67912 Unspecified disorder of synovium and tendon, left shoulder: Secondary | ICD-10-CM | POA: Diagnosis not present

## 2020-12-02 DIAGNOSIS — Z79899 Other long term (current) drug therapy: Secondary | ICD-10-CM | POA: Diagnosis not present

## 2020-12-03 DIAGNOSIS — H905 Unspecified sensorineural hearing loss: Secondary | ICD-10-CM | POA: Diagnosis not present

## 2020-12-15 DIAGNOSIS — Z9189 Other specified personal risk factors, not elsewhere classified: Secondary | ICD-10-CM | POA: Diagnosis not present

## 2020-12-15 DIAGNOSIS — U071 COVID-19: Secondary | ICD-10-CM | POA: Diagnosis not present

## 2020-12-18 DIAGNOSIS — U071 COVID-19: Secondary | ICD-10-CM | POA: Diagnosis not present

## 2020-12-18 DIAGNOSIS — Z9189 Other specified personal risk factors, not elsewhere classified: Secondary | ICD-10-CM | POA: Diagnosis not present

## 2020-12-29 DIAGNOSIS — G8929 Other chronic pain: Secondary | ICD-10-CM | POA: Diagnosis not present

## 2020-12-29 DIAGNOSIS — M5442 Lumbago with sciatica, left side: Secondary | ICD-10-CM | POA: Diagnosis not present

## 2020-12-29 DIAGNOSIS — M25511 Pain in right shoulder: Secondary | ICD-10-CM | POA: Diagnosis not present

## 2020-12-29 DIAGNOSIS — M25532 Pain in left wrist: Secondary | ICD-10-CM | POA: Diagnosis not present

## 2020-12-29 DIAGNOSIS — F32A Depression, unspecified: Secondary | ICD-10-CM | POA: Diagnosis not present

## 2020-12-29 DIAGNOSIS — Z79899 Other long term (current) drug therapy: Secondary | ICD-10-CM | POA: Diagnosis not present

## 2020-12-29 DIAGNOSIS — Z9181 History of falling: Secondary | ICD-10-CM | POA: Diagnosis not present

## 2020-12-31 DIAGNOSIS — Z79899 Other long term (current) drug therapy: Secondary | ICD-10-CM | POA: Diagnosis not present

## 2021-01-02 ENCOUNTER — Ambulatory Visit (INDEPENDENT_AMBULATORY_CARE_PROVIDER_SITE_OTHER): Payer: Medicare Other

## 2021-01-02 ENCOUNTER — Telehealth: Payer: Self-pay

## 2021-01-02 DIAGNOSIS — Z0000 Encounter for general adult medical examination without abnormal findings: Secondary | ICD-10-CM | POA: Diagnosis not present

## 2021-01-02 NOTE — Progress Notes (Signed)
Subjective:   Norma [name] is a 60 y.o. female who presents for Medicare Annual (Subsequent) preventive examination.  I connected with  Norma [name] on [id number] by an audio only telemedicine application and verified that I am speaking with the correct person using two identifiers.   I discussed the limitations, risks, security and privacy concerns of performing an evaluation and management service by telephone and the availability of in person appointments. I also discussed with the patient that there may be a patient responsible charge related to this service. The patient expressed understanding and verbally consented to this telephonic visit.  Location of Patient: Home Location of Provider: Office  List any persons and their role that are participating in the visit with the patient. Izabel Pala and [employer], [place] were the only participants during the visit.   Non face to face encounter, 45 minutes.    Review of Systems     Defer to PCP Cardiac Risk Factors include: diabetes mellitus;dyslipidemia;hypertension     Objective:    Today's Vitals   01/02/21 1508  PainSc: 3    There is no height or weight on file to calculate BMI.  Advanced Directives 01/02/2021 08/08/2020 04/02/2020 11/14/2019 10/09/2019 09/18/2019 08/22/2019  Does Patient Have a Medical Advance Directive? No No No No No No No  Would patient like information on creating a medical advance directive? Yes (MAU/Ambulatory/Procedural Areas - Information given) No - Patient declined No - Patient declined No - Patient declined No - Patient declined Yes (MAU/Ambulatory/Procedural Areas - Information given) No - Patient declined  Pre-existing out of facility DNR order (yellow form or pink MOST form) - - - - - - -  Some encounter information is confidential and restricted. Go to Review Flowsheets activity to see all data.    Current Medications (verified) Outpatient Encounter Medications as of 01/02/2021   Medication Sig   albuterol (VENTOLIN HFA) 108 (90 Base) MCG/ACT inhaler SMARTSIG:1-2 Puff(s) Via Inhaler Every 4-6 Hours PRN   atorvastatin (LIPITOR) 20 MG tablet Take 20 mg by mouth daily.   buPROPion (WELLBUTRIN XL) 300 MG 24 hr tablet Take 300 mg by mouth daily.   cetirizine (ZYRTEC ALLERGY) 10 MG tablet Take 1 tablet (10 mg total) by mouth daily.   cyclobenzaprine (FLEXERIL) 10 MG tablet TAKE 1 TABLET BY MOUTH TWICE A DAY AS NEEDED FOR MUSCLE SPASMS   diclofenac Sodium (VOLTAREN) 1 % GEL Please apply 1-2 times daily to affected area   EPINEPHrine 0.3 mg/0.3 mL IJ SOAJ injection Inject into the muscle as directed.   etodolac (LODINE) 400 MG tablet Take 400 mg by mouth 2 (two) times daily.   fluticasone (FLONASE) 50 MCG/ACT nasal spray Place 2 sprays into both nostrils daily.   gabapentin (NEURONTIN) 300 MG capsule Take 1 capsule by mouth 3 (three) times daily.   hydrochlorothiazide (HYDRODIURIL) 25 MG tablet Take 1 tablet (25 mg total) by mouth daily.   ibuprofen (ADVIL) 600 MG tablet Take 1 tablet (600 mg total) by mouth every 6 (six) hours as needed. Take with breakfast for 5 days. Can continue for longer if symptoms persist.   losartan (COZAAR) 50 MG tablet Take 50 mg by mouth daily.   meloxicam (MOBIC) 15 MG tablet Take 15 mg by mouth daily.   metFORMIN (GLUCOPHAGE) 1000 MG tablet Take 1,000 mg by mouth 2 (two) times daily.   metoprolol tartrate (LOPRESSOR) 25 MG tablet Take 1 tablet (25 mg total) by mouth 2 (two) times daily.  multivitamin-iron-minerals-folic acid (CENTRUM) chewable tablet Chew 1 tablet daily by mouth.   naloxone (NARCAN) nasal spray 4 mg/0.1 mL SMARTSIG:Both Nares   olopatadine (PATANOL) 0.1 % ophthalmic solution SMARTSIG:1-2 Drop(s) In Eye(s) Twice Daily PRN   omeprazole (PRILOSEC) 20 MG capsule Take 2 capsules (40 mg total) by mouth daily.   oxyCODONE-acetaminophen (PERCOCET) 10-325 MG tablet Take 1 tablet by mouth 3 (three) times daily.   potassium chloride  (KLOR-CON) 10 MEQ tablet Take 10 mEq by mouth 2 (two) times daily.   promethazine (PHENERGAN) 6.25 MG/5ML syrup Take 6.25 mg by mouth every 6 (six) hours as needed.   RESTASIS 0.05 % ophthalmic emulsion SMARTSIG:1 Drop(s) In Eye(s) Every 12 Hours   SYMBICORT 160-4.5 MCG/ACT inhaler Inhale into the lungs.   traZODone (DESYREL) 100 MG tablet TAKE 2 TABLETS BY MOUTH ONCE AT BEDTIME   Vitamin D, Ergocalciferol, (DRISDOL) 50000 units CAPS capsule TAKE 1 CAPSULE BY MOUTH EVERY 7 (SEVEN) DAYS.   [DISCONTINUED] metFORMIN (GLUCOPHAGE) 500 MG tablet Take 500 mg by mouth 2 (two) times daily.   [DISCONTINUED] ferrous sulfate 325 (65 FE) MG tablet Take 1 tablet (325 mg total) by mouth 2 (two) times daily with a meal.   [DISCONTINUED] fluticasone (FLONASE) 50 MCG/ACT nasal spray Place 1 spray into both nostrils daily.   [DISCONTINUED] gabapentin (NEURONTIN) 100 MG capsule Take 1 capsule (100 mg total) by mouth at bedtime. (Patient not taking: Reported on 01/02/2021)   [DISCONTINUED] loratadine (CLARITIN) 10 MG tablet Take 1 tablet (10 mg total) by mouth daily.   No facility-administered encounter medications on file as of 01/02/2021.    Allergies (verified) Soma [carisoprodol], Nortriptyline, and Prednisone   History: Past Medical History:  Diagnosis Date   A-fib (HCC)    Acid reflux    Allergy    Anemia    Anxiety    Arthritis    Back pain    Depression    Diabetes mellitus without complication ([place])    on meds   Dysrhythmia    Headache    stress headaches, migraines at times   Heart murmur    Hypertension    Irregular heart beat    Left shoulder pain    Shortness of breath dyspnea    with exertion   Tachyarrhythmia    Trigger point of thoracic region 03/22/2012   Past Surgical History:  Procedure Laterality Date   ANKLE SURGERY     CHOLECYSTECTOMY N/A 03/20/2020   Procedure: LAPAROSCOPIC CHOLECYSTECTOMY WITH INTRAOPERATIVE CHOLANGIOGRAM AND WEDGE LIVER BIOPSY;  Surgeon: [name], MD;  Location: [facility];  Service: General;  Laterality: N/A;   KNEE SURGERY Left    LUMBAR LAMINECTOMY/DECOMPRESSION MICRODISCECTOMY Left 06/06/2014   Procedure: LUMBAR LAMINECTOMY/DECOMPRESSION MICRODISCECTOMY 1 LEVEL;  Surgeon: Newman Pies, MD;  Location: [facility];  Service: Neurosurgery;  Laterality: Left;  Left L5S1 microdiskectomy   ROTATOR CUFF REPAIR Left 02/20/14   TUBAL LIGATION     Family History  Problem Relation Age of Onset   Diabetes Brother    Diabetes Maternal Aunt    Diabetes Paternal Aunt    Diabetes Maternal Grandmother    Diabetes Maternal Grandfather    Diabetes Paternal Grandmother    Diabetes Paternal Grandfather    Congestive Heart Failure Mother    Hypertension Son    Colon cancer Neg Hx    Esophageal cancer Neg Hx    Rectal cancer Neg Hx    Stomach cancer Neg Hx    Social History   Socioeconomic History  Marital status: Legally Separated    Spouse name: Not on file   Number of children: 4   Years of education: 31   Highest education level: 12th grade  Occupational History   Not on file  Tobacco Use   Smoking status: Never   Smokeless tobacco: Never  Vaping Use   Vaping Use: Never used  Substance and Sexual Activity   Alcohol use: Yes    Comment: glass of wine occasionally   Drug use: No    Comment: States no longer uses marijuana   Sexual activity: Yes    Birth control/protection: Condom  Other Topics Concern   Not on file  Social History Narrative   Lives by herself, 60 year old son comes and goes.       Has a pitbull dog named Duchess. Lives in one level house with two steps in back. Going to start going back to church. Likes to go out to eat and go to movies. Watches grandchildren, has six with two expected. Has occasional food insecurity.    Social Determinants of Health   Financial Resource Strain: Medium Risk   Difficulty of Paying Living Expenses: Somewhat hard  Food Insecurity: No Food Insecurity   Worried  About Charity fundraiser in the Last Year: Never true   Ran Out of Food in the Last Year: Never true  Transportation Needs: No Transportation Needs   Lack of Transportation (Medical): No   Lack of Transportation (Non-Medical): No  Physical Activity: Insufficiently Active   Days of Exercise per Week: 3 days   Minutes of Exercise per Session: 20 min  Stress: Stress Concern Present   Feeling of Stress : To some extent  Social Connections: Socially Isolated   Frequency of Communication with Friends and Family: Three times a week   Frequency of Social Gatherings with Friends and Family: Once a week   Attends Religious Services: Never   Marine scientist or Organizations: No   Attends Music therapist: Never   Marital Status: Never married    Tobacco Counseling Counseling given: No   Clinical Intake:  Pre-visit preparation completed: Yes  Pain : 0-10 Pain Score: 3  Pain Type: Chronic pain Pain Location: Shoulder Pain Orientation: Right Pain Radiating Towards: radiates up to neck Pain Descriptors / Indicators: Sharp Pain Onset: More than a month ago Pain Frequency: Intermittent Pain Relieving Factors: NSAIDS and Volraren Gell help. Effect of Pain on Daily Activities: Does not affect ADL's  Pain Relieving Factors: NSAIDS and Volraren Gell help.  Diabetes: Yes CBG done?: No Did pt. bring in CBG monitor from home?: No  How often do you need to have someone help you when you read instructions, pamphlets, or other written materials from your doctor or pharmacy?: 1 - Never What is the last grade level you completed in school?: 12th grade  Diabetic? Yes  Interpreter Needed?: No  Information entered by :: [place] of Daily Living In your present state of health, do you have any difficulty performing the following activities: 01/02/2021  Hearing? Y  Comment Right ear - has a hearing aid  Vision? N  Difficulty concentrating or making  decisions? N  Walking or climbing stairs? Y  Dressing or bathing? N  Doing errands, shopping? N  Preparing Food and eating ? N  Using the Toilet? N  In the past six months, have you accidently leaked urine? N  Do you have problems with loss of bowel control?  N  Managing your Medications? N  Managing your Finances? N  Housekeeping or managing your Housekeeping? N  Some encounter information is confidential and restricted. Go to Review Flowsheets activity to see all data.  Some recent data might be hidden    Patient Care Team: Alen Bleacher, MD as PCP - General (Family Medicine) Inocencio Homes, DPM as Consulting Physician (Podiatry) Dixie Dials, MD as Consulting Physician (Cardiology) Sandi Mariscal, MD as Referring Physician (Internal Medicine) [facility] (Ophthalmology)  Indicate any recent Medical Services you may have received from other than [facility] providers in the past year (date may be approximate).     Assessment:   This is a routine wellness examination for Ramyah.  Hearing/Vision screen No results found.  Dietary issues and exercise activities discussed: Current Exercise Habits: Home exercise routine, Type of exercise: walking, Time (Minutes): 20, Frequency (Times/Week): 3, Weekly Exercise (Minutes/Week): 60, Intensity: Mild, Exercise limited by: orthopedic condition(s);respiratory conditions(s)   Goals Addressed   None   Depression Screen PHQ 2/9 Scores 01/02/2021 08/08/2020 04/02/2020 11/14/2019 10/15/2019 10/09/2019 08/22/2019  PHQ - 2 Score 2 0 0 0 0 0 1  PHQ- 9 Score 4 0 0 3 - - -  Exception Documentation - - - - - - -  Not completed - - - - - - -    Fall Risk Fall Risk  01/02/2021 10/15/2019 10/09/2019 08/22/2019 08/21/2019  Falls in the past year? 0 0 0 0 1  Number falls in past yr: 0 0 0 0 0  Injury with Fall? 0 0 0 0 0  Comment - - - - -  Risk Factor Category  - - - - -  Risk for fall due to : No Fall Risks - - - -  Risk for fall due to: Comment - - - - -   Follow up Falls evaluation completed - - - -    FALL RISK PREVENTION PERTAINING TO THE HOME:  Any stairs in or around the home? Yes  If so, are there any without handrails? Yes  Home free of loose throw rugs in walkways, pet beds, electrical cords, etc? Yes  Adequate lighting in your home to reduce risk of falls? No   ASSISTIVE DEVICES UTILIZED TO PREVENT FALLS:  Life alert? No  Use of a cane, walker or w/c? No  Grab bars in the bathroom? No  Shower chair or bench in shower? No  Elevated toilet seat or a handicapped toilet? No    Cognitive Function: MMSE - Mini Mental State Exam 01/02/2018  Orientation to time 5  Orientation to Place 5  Registration 3  Attention/ Calculation 5  Recall 3  Language- name 2 objects 2  Language- repeat 1  Language- follow 3 step command 3  Language- read & follow direction 1  Write a sentence 1  Copy design 1  Total score 30     6CIT Screen 01/02/2021 01/02/2018  What Year? 0 points 0 points  What month? 0 points 0 points  What time? 0 points 0 points  Count back from 20 0 points 0 points  Months in reverse 0 points 0 points  Repeat phrase 0 points 0 points  Total Score 0 0    Immunizations Immunization History  Administered Date(s) Administered   Influenza Split 01/22/2011, 01/31/2012   Influenza Whole 02/07/2007, 01/19/2008, 02/07/2009, 01/19/2010   Influenza,inj,Quad PF,6+ Mos 01/10/2013, 01/14/2014, 03/06/2015, 02/24/2017, 12/22/2017, 01/23/2019, 02/18/2020   Moderna Sars-Covid-2 Vaccination 08/31/2019, 09/26/2019   PPD Test 06/02/2010, 06/09/2010,  06/16/2011, 12/04/2012   Pneumococcal Polysaccharide-23 08/21/2019   Td 01/17/2002   Tdap 04/13/2015    TDAP status: Up to date  Flu Vaccine status: Due, Education has been provided regarding the importance of this vaccine. Advised may receive this vaccine at local pharmacy or Health Dept. Aware to provide a copy of the vaccination record if obtained from local pharmacy or Health  Dept. Verbalized acceptance and understanding.  Pneumococcal vaccine status: Up to date  Covid-19 vaccine status: Completed vaccinesPatient states she will bring updated vaccine card to office at her next appointment.   Qualifies for Shingles Vaccine? Yes   Zostavax completed No   Shingrix Completed?: No.    Education has been provided regarding the importance of this vaccine. Patient has been advised to call insurance company to determine out of pocket expense if they have not yet received this vaccine. Advised may also receive vaccine at local pharmacy or Health Dept. Verbalized acceptance and understanding.  Screening Tests Health Maintenance  Topic Date Due   FOOT EXAM  Never done   OPHTHALMOLOGY EXAM  Never done   Zoster Vaccines- Shingrix (1 of 2) Never done   COVID-19 Vaccine (3 - Moderna risk series) 10/24/2019   INFLUENZA VACCINE  11/17/2020   PAP SMEAR-Modifier  12/22/2020   COLONOSCOPY (Pts 45-3yr Insurance coverage will need to be confirmed)  01/25/2021   HEMOGLOBIN A1C  02/07/2021   MAMMOGRAM  10/22/2022   TETANUS/TDAP  04/12/2025   Hepatitis C Screening  Completed   HIV Screening  Completed   HPV VACCINES  Aged Out    Health Maintenance  Health Maintenance Due  Topic Date Due   FOOT EXAM  Never done   OPHTHALMOLOGY EXAM  Never done   Zoster Vaccines- Shingrix (1 of 2) Never done   COVID-19 Vaccine (3 - Moderna risk series) 10/24/2019   INFLUENZA VACCINE  11/17/2020   PAP SMEAR-Modifier  12/22/2020    Colorectal cancer screening: Type of screening: Colonoscopy. Completed 01/25/2018. Repeat every 3 years  Mammogram status: Completed [id number] Repeat every year  Patient does not qualify for Bone Density  Lung Cancer Screening: (Low Dose CT Chest recommended if Age 62-80years, 30 pack-year currently smoking OR have quit w/in 15years.) does not qualify.   Lung Cancer Screening Referral: N/A  Additional Screening:  Hepatitis C Screening: does qualify;  Completed 03/18/2020  Vision Screening: Recommended annual ophthalmology exams for early detection of glaucoma and other disorders of the eye. Is the patient up to date with their annual eye exam?  Yes  Who is the provider or what is the name of the office in which the patient attends annual eye exams[place] [facility] is not established with a provider, would they like to be referred to a provider to establish care? No . Record request sent for eye exam notes.   Dental Screening: Recommended annual dental exams for proper oral hygiene  Community Resource Referral / Chronic Care Management: CRR required this visit?  No   CCM required this visit?  No      Plan:     I have personally reviewed and noted the following in the patient's chart:   Medical and social history Use of alcohol, tobacco or illicit drugs  Current medications and supplements including opioid prescriptions.  Functional ability and status Nutritional status Physical activity Advanced directives List of other physicians Hospitalizations, surgeries, and ER visits in previous 12 months Vitals Screenings to include cognitive, depression, and falls Referrals and appointments  In addition, I have reviewed and discussed with patient certain preventive protocols, quality metrics, and best practice recommendations. A written personalized care plan for preventive services as well as general preventive health recommendations were provided to patient.     Tawni Pummel, [place]   01/02/2021   Nurse Notes:  . Patient and I discussed ACP and the importance of this planning. Patient agrees to learn more about it and would like a visit with PCP to discuss the further and to determine the next steps to complete this. I have sent a message to the scheduling staff to have patient come in for a F2F visit with PCP.     Ms. Seigler , Thank you for taking time to come for your Medicare Wellness Visit. I appreciate your  ongoing commitment to your health goals. Please review the following plan we discussed and let me know if I can assist you in the future.   These are the goals we discussed:  Goals       exercise      Try to increase exercise to 4 days per week 30 minutes each time; currently limited by back and knee pain.      Weight (lb) < 228 lb (103.4 kg) (pt-stated)      7% weight loss        This is a list of the screening recommended for you and due dates:  Health Maintenance  Topic Date Due   Complete foot exam   Never done   Eye exam for diabetics  Never done   Zoster (Shingles) Vaccine (1 of 2) Never done   COVID-19 Vaccine (3 - Moderna risk series) 10/24/2019   Flu Shot  11/17/2020   Pap Smear  12/22/2020   Colon Cancer Screening  01/25/2021   Hemoglobin A1C  02/07/2021   Mammogram  10/22/2022   Tetanus Vaccine  04/12/2025   Hepatitis C Screening: USPSTF Recommendation to screen - Ages 18-79 yo.  Completed   HIV Screening  Completed   HPV Vaccine  Aged Out

## 2021-01-02 NOTE — Telephone Encounter (Signed)
Patient stated during the AWV that she is having some issue affording her utilities and is struggling to make ends meet. Pt would benefit from some assistance by SW.

## 2021-01-02 NOTE — Patient Instructions (Signed)
Health Maintenance, Female Adopting a healthy lifestyle and getting preventive care are important in promoting health and wellness. Ask your health care provider about: The right schedule for you to have regular tests and exams. Things you can do on your own to prevent diseases and keep yourself healthy. What should I know about diet, weight, and exercise? Eat a healthy diet  Eat a diet that includes plenty of vegetables, fruits, low-fat dairy products, and lean protein. Do not eat a lot of foods that are high in solid fats, added sugars, or sodium. Maintain a healthy weight Body mass index (BMI) is used to identify weight problems. It estimates body fat based on height and weight. Your health care provider can help determine your BMI and help you achieve or maintain a healthy weight. Get regular exercise Get regular exercise. This is one of the most important things you can do for your health. Most adults should: Exercise for at least 150 minutes each week. The exercise should increase your heart rate and make you sweat (moderate-intensity exercise). Do strengthening exercises at least twice a week. This is in addition to the moderate-intensity exercise. Spend less time sitting. Even light physical activity can be beneficial. Watch cholesterol and blood lipids Have your blood tested for lipids and cholesterol at 62 years of age, then have this test every 5 years. Have your cholesterol levels checked more often if: Your lipid or cholesterol levels are high. You are older than 62 years of age. You are at high risk for heart disease. What should I know about cancer screening? Depending on your health history and family history, you may need to have cancer screening at various ages. This may include screening for: Breast cancer. Cervical cancer. Colorectal cancer. Skin cancer. Lung cancer. What should I know about heart disease, diabetes, and high blood pressure? Blood pressure and heart  disease High blood pressure causes heart disease and increases the risk of stroke. This is more likely to develop in people who have high blood pressure readings, are of African descent, or are overweight. Have your blood pressure checked: Every 3-5 years if you are 18-39 years of age. Every year if you are 40 years old or older. Diabetes Have regular diabetes screenings. This checks your fasting blood sugar level. Have the screening done: Once every three years after age 40 if you are at a normal weight and have a low risk for diabetes. More often and at a younger age if you are overweight or have a high risk for diabetes. What should I know about preventing infection? Hepatitis B If you have a higher risk for hepatitis B, you should be screened for this virus. Talk with your health care provider to find out if you are at risk for hepatitis B infection. Hepatitis C Testing is recommended for: Everyone born from 1945 through 1965. Anyone with known risk factors for hepatitis C. Sexually transmitted infections (STIs) Get screened for STIs, including gonorrhea and chlamydia, if: You are sexually active and are younger than 62 years of age. You are older than 62 years of age and your health care provider tells you that you are at risk for this type of infection. Your sexual activity has changed since you were last screened, and you are at increased risk for chlamydia or gonorrhea. Ask your health care provider if you are at risk. Ask your health care provider about whether you are at high risk for HIV. Your health care provider may recommend a prescription medicine   to help prevent HIV infection. If you choose to take medicine to prevent HIV, you should first get tested for HIV. You should then be tested every 3 months for as long as you are taking the medicine. Pregnancy If you are about to stop having your period (premenopausal) and you may become pregnant, seek counseling before you get  pregnant. Take 400 to 800 micrograms (mcg) of folic acid every day if you become pregnant. Ask for birth control (contraception) if you want to prevent pregnancy. Osteoporosis and menopause Osteoporosis is a disease in which the bones lose minerals and strength with aging. This can result in bone fractures. If you are 65 years old or older, or if you are at risk for osteoporosis and fractures, ask your health care provider if you should: Be screened for bone loss. Take a calcium or vitamin D supplement to lower your risk of fractures. Be given hormone replacement therapy (HRT) to treat symptoms of menopause. Follow these instructions at home: Lifestyle Do not use any products that contain nicotine or tobacco, such as cigarettes, e-cigarettes, and chewing tobacco. If you need help quitting, ask your health care provider. Do not use street drugs. Do not share needles. Ask your health care provider for help if you need support or information about quitting drugs. Alcohol use Do not drink alcohol if: Your health care provider tells you not to drink. You are pregnant, may be pregnant, or are planning to become pregnant. If you drink alcohol: Limit how much you use to 0-1 drink a day. Limit intake if you are breastfeeding. Be aware of how much alcohol is in your drink. In the [place] one drink equals one 12 oz bottle of beer (355 mL), one 5 oz glass of wine (148 mL), or one 1 oz glass of hard liquor (44 mL). General instructions Schedule regular health, dental, and eye exams. Stay current with your vaccines. Tell your health care provider if: You often feel depressed. You have ever been abused or do not feel safe at home. Summary Adopting a healthy lifestyle and getting preventive care are important in promoting health and wellness. Follow your health care provider's instructions about healthy diet, exercising, and getting tested or screened for diseases. Follow your health care provider's  instructions on monitoring your cholesterol and blood pressure. This information is not intended to replace advice given to you by your health care provider. Make sure you discuss any questions you have with your health care provider. Document Revised: 06/13/2020 Document Reviewed: 03/29/2018 Elsevier Patient Education  2022 Elsevier Inc.  

## 2021-01-09 ENCOUNTER — Encounter (HOSPITAL_COMMUNITY): Payer: Self-pay

## 2021-01-09 ENCOUNTER — Emergency Department (HOSPITAL_COMMUNITY)
Admission: EM | Admit: 2021-01-09 | Discharge: 2021-01-09 | Disposition: A | Discharge status: Home / Self Care | Payer: Medicare Other | Attending: Emergency Medicine | Admitting: Emergency Medicine

## 2021-01-09 ENCOUNTER — Ambulatory Visit: Payer: Medicare Other | Admitting: Student

## 2021-01-09 ENCOUNTER — Emergency Department (HOSPITAL_COMMUNITY): Payer: Medicare Other

## 2021-01-09 DIAGNOSIS — M5442 Lumbago with sciatica, left side: Secondary | ICD-10-CM

## 2021-01-09 DIAGNOSIS — N200 Calculus of kidney: Secondary | ICD-10-CM | POA: Diagnosis not present

## 2021-01-09 DIAGNOSIS — E119 Type 2 diabetes mellitus without complications: Secondary | ICD-10-CM | POA: Insufficient documentation

## 2021-01-09 DIAGNOSIS — R202 Paresthesia of skin: Secondary | ICD-10-CM | POA: Diagnosis not present

## 2021-01-09 DIAGNOSIS — Z79899 Other long term (current) drug therapy: Secondary | ICD-10-CM | POA: Diagnosis not present

## 2021-01-09 DIAGNOSIS — M549 Dorsalgia, unspecified: Secondary | ICD-10-CM | POA: Diagnosis not present

## 2021-01-09 DIAGNOSIS — S3992XA Unspecified injury of lower back, initial encounter: Secondary | ICD-10-CM | POA: Diagnosis not present

## 2021-01-09 DIAGNOSIS — M545 Low back pain, unspecified: Secondary | ICD-10-CM | POA: Diagnosis not present

## 2021-01-09 DIAGNOSIS — Z7984 Long term (current) use of oral hypoglycemic drugs: Secondary | ICD-10-CM | POA: Insufficient documentation

## 2021-01-09 DIAGNOSIS — I10 Essential (primary) hypertension: Secondary | ICD-10-CM | POA: Diagnosis not present

## 2021-01-09 DIAGNOSIS — M2578 Osteophyte, vertebrae: Secondary | ICD-10-CM | POA: Diagnosis not present

## 2021-01-09 DIAGNOSIS — M4807 Spinal stenosis, lumbosacral region: Secondary | ICD-10-CM | POA: Diagnosis not present

## 2021-01-09 MED ORDER — ONDANSETRON 4 MG PO TBDP
4.0000 mg | ORAL_TABLET | Freq: Once | ORAL | Status: AC
  Administered 2021-01-09: 4 mg via ORAL
  Filled 2021-01-09: qty 1

## 2021-01-09 MED ORDER — LIDOCAINE 5 % EX PTCH: 1.0000 | MEDICATED_PATCH | CUTANEOUS | 0 refills | Status: AC

## 2021-01-09 MED ORDER — MELOXICAM 7.5 MG PO TABS: 7.5000 mg | ORAL_TABLET | Freq: Every day | ORAL | 0 refills | Status: AC

## 2021-01-09 MED ORDER — ONDANSETRON HCL 4 MG/2ML IJ SOLN: 4.0000 mg | Freq: Once | INTRAMUSCULAR | Status: DC

## 2021-01-09 MED ORDER — HYDROMORPHONE HCL 1 MG/ML IJ SOLN
1.0000 mg | Freq: Once | INTRAMUSCULAR | Status: AC
Start: 2021-01-09 — End: 2021-01-09
  Administered 2021-01-09: 1 mg via INTRAMUSCULAR
  Filled 2021-01-09: qty 1

## 2021-01-09 NOTE — ED Triage Notes (Addendum)
BIB EMS for Back pain x3 days. Pain occurred after lifting her mother off of floor. Pain starts in the back and radiates to L. Leg. Pain also associated with numbness and tingling. BP 154/92 Pulse 64 Cbg 111

## 2021-01-09 NOTE — Discharge Instructions (Addendum)
You have been seen here for back pain, I recommend taking over-the-counter pain medications like ibuprofen and/or Tylenol every 6 as needed.  I am also giving a prescription for meloxicam if you take this please do not take this with other NSAIDs like ibuprofen, naproxen, etc. as they are within the same family and could cause adverse reactions.  I also recommend applying heat to the area and stretching out the muscles as this will help decrease stiffness and pain.    Please follow-up with neurosurgeon for further evaluation.  Come back to the emergency department if you develop chest pain, shortness of breath, severe abdominal pain, uncontrolled nausea, vomiting, diarrhea.

## 2021-01-09 NOTE — ED Provider Notes (Addendum)
[facility] DEPT Provider Note   CSN: [id number] Arrival date & time: 01/09/21  1235     History Chief Complaint  Patient presents with   Back Pain    Radiates to L. leg    Norma [name] is a 60 y.o. female.  HPI  Patient with significant medical history of A. fib, diabetes, chronic back pain, presents with chief complaint of left lower back pain.  Patient states that pain started proximately 3 days ago, happened after she tried to lift up her mother who unfortunately fell onto the floor.  She states  she has been having worsening pain.  States she has pain that radiates down to her left posterior aspect of her thigh, will have intermittent paresthesia in her toes, she denies urinary symptoms, denies urinary incontinency, retention, difficulty with bowel movements, she denies flank pain, stomach pain, nausea, vomit, diarrhea.  She has no associated fevers or chills, she denies history of IV drug use.  She states that she has history of chronic back pain states that flared up.  She states she has  been taking her over-the-counter medications without much relief, she is also tried Flexeril as well as oxycodone but nothing seems to help.   Past Medical History:  Diagnosis Date   A-fib ([place])    Acid reflux    Allergy    Anemia    Anxiety    Arthritis    Back pain    Depression    Diabetes mellitus without complication ([place])    on meds   Dysrhythmia    Headache    stress headaches, migraines at times   Heart murmur    Hypertension    Irregular heart beat    Left shoulder pain    Shortness of breath dyspnea    with exertion   Tachyarrhythmia    Trigger point of thoracic region 03/22/2012    Patient Active Problem List   Diagnosis Date Noted   Tendinopathy of right rotator cuff 04/04/2020   HCV antibody positive 03/18/2020   RUQ pain 76/73/4193   Biliary colic symptom    Elevated liver enzymes    Abnormal findings on diagnostic imaging of  liver and biliary tract    Abdominal pain 03/16/2020   Cholelithiasis 03/16/2020   Hypertensive urgency 03/16/2020   Type 2 diabetes mellitus ([place]) 03/16/2020   Lead exposure 11/14/2019   TMJ (temporomandibular joint disorder) 07/31/2019   Left hip pain 02/27/2019   Uterine mass 12/19/2018   Allergic rhinitis with postnasal drip 11/17/2018   Diverticulosis of colon without hemorrhage 06/23/2018   Intractable migraine without aura and without status migrainosus 02/16/2018   Iron deficiency anemia 03/29/2017   Vitamin D deficiency 03/01/2017   Lumbar herniated disc 06/06/2014   Depression    Prediabetes 01/14/2014   Spinal stenosis, lumbar region, with neurogenic claudication 11/21/2013   Hot flashes 06/30/2011   Eczema, dyshidrotic 06/02/2010   FIBROIDS, UTERUS 10/24/2009   GERD 06/09/2009   Chronic pain syndrome 10/04/2008   Hepatitis C carrier ([place]) 03/21/2007   Obesity 06/16/2006   HYPERTENSION, BENIGN SYSTEMIC 06/16/2006    Past Surgical History:  Procedure Laterality Date   ANKLE SURGERY     CHOLECYSTECTOMY N/A 03/20/2020   Procedure: LAPAROSCOPIC CHOLECYSTECTOMY WITH INTRAOPERATIVE CHOLANGIOGRAM AND WEDGE LIVER BIOPSY;  Surgeon: [name], MD;  Location: [facility];  Service: General;  Laterality: N/A;   KNEE SURGERY Left    LUMBAR LAMINECTOMY/DECOMPRESSION MICRODISCECTOMY Left 06/06/2014   Procedure: LUMBAR LAMINECTOMY/DECOMPRESSION MICRODISCECTOMY  1 LEVEL;  Surgeon: Newman Pies, MD;  Location: [facility];  Service: Neurosurgery;  Laterality: Left;  Left L5S1 microdiskectomy   ROTATOR CUFF REPAIR Left 02/20/14   TUBAL LIGATION       OB History     Gravida  4   Para  4   Term  4   Preterm      AB  0   Living  4      SAB  0   IAB  0   Ectopic  0   Multiple  0   Live Births  4           Family History  Problem Relation Age of Onset   Diabetes Brother    Diabetes Maternal Aunt    Diabetes Paternal Aunt    Diabetes Maternal Grandmother     Diabetes Maternal Grandfather    Diabetes Paternal Grandmother    Diabetes Paternal Grandfather    Congestive Heart Failure Mother    Hypertension Son    Colon cancer Neg Hx    Esophageal cancer Neg Hx    Rectal cancer Neg Hx    Stomach cancer Neg Hx     Social History   Tobacco Use   Smoking status: Never   Smokeless tobacco: Never  Vaping Use   Vaping Use: Never used  Substance Use Topics   Alcohol use: Yes    Comment: glass of wine occasionally   Drug use: No    Comment: States no longer uses marijuana    Home Medications Prior to Admission medications   Medication Sig Start Date End Date Taking? Authorizing Provider  lidocaine (LIDODERM) 5 % Place 1 patch onto the skin daily for 7 days. Remove & Discard patch within 12 hours or as directed by MD 01/09/21 01/16/21 Yes Marcello Fennel, PA-C  meloxicam (MOBIC) 7.5 MG tablet Take 1 tablet (7.5 mg total) by mouth daily for 7 days. 01/09/21 01/16/21 Yes Marcello Fennel, PA-C  albuterol (VENTOLIN HFA) 108 (90 Base) MCG/ACT inhaler SMARTSIG:1-2 Puff(s) Via Inhaler Every 4-6 Hours PRN 08/25/20   [provider]  atorvastatin (LIPITOR) 20 MG tablet Take 20 mg by mouth daily. 11/18/20   [provider]  buPROPion (WELLBUTRIN XL) 300 MG 24 hr tablet Take 300 mg by mouth daily.    [provider]  cetirizine (ZYRTEC ALLERGY) 10 MG tablet Take 1 tablet (10 mg total) by mouth daily. 11/07/20   Hazel Sams, PA-C  cyclobenzaprine (FLEXERIL) 10 MG tablet TAKE 1 TABLET BY MOUTH TWICE A DAY AS NEEDED FOR MUSCLE SPASMS 12/23/17   Meccariello, Bernita Raisin, DO  diclofenac Sodium (VOLTAREN) 1 % GEL Please apply 1-2 times daily to affected area 10/09/19   [name], MD  EPINEPHrine 0.3 mg/0.3 mL IJ SOAJ injection Inject into the muscle as directed. 07/22/20   [provider]  etodolac (LODINE) 400 MG tablet Take 400 mg by mouth 2 (two) times daily. 12/02/20   [provider]  fluticasone (FLONASE) 50  MCG/ACT nasal spray Place 2 sprays into both nostrils daily. 10/15/19   Meccariello, Bernita Raisin, DO  gabapentin (NEURONTIN) 300 MG capsule Take 1 capsule by mouth 3 (three) times daily. 09/25/20   [provider]  hydrochlorothiazide (HYDRODIURIL) 25 MG tablet Take 1 tablet (25 mg total) by mouth daily. 11/02/16   Everrett Coombe, MD  ibuprofen (ADVIL) 600 MG tablet Take 1 tablet (600 mg total) by mouth every 6 (six) hours as needed. Take with  breakfast for 5 days. Can continue for longer if symptoms persist. 11/07/20   Hazel Sams, PA-C  losartan (COZAAR) 50 MG tablet Take 50 mg by mouth daily. 01/24/20   [provider]  metFORMIN (GLUCOPHAGE) 1000 MG tablet Take 1,000 mg by mouth 2 (two) times daily. 10/30/20   [provider]  metoprolol tartrate (LOPRESSOR) 25 MG tablet Take 1 tablet (25 mg total) by mouth 2 (two) times daily. 05/13/14   [name], MD  multivitamin-iron-minerals-folic acid (CENTRUM) chewable tablet Chew 1 tablet daily by mouth. 03/01/17   [name], MD  naloxone [facility]) nasal spray 4 mg/0.1 mL SMARTSIG:Both Nares 12/29/20   [provider]  olopatadine (PATANOL) 0.1 % ophthalmic solution SMARTSIG:1-2 Drop(s) In Eye(s) Twice Daily PRN 10/27/20   [provider]  omeprazole (PRILOSEC) 20 MG capsule Take 2 capsules (40 mg total) by mouth daily. 05/25/19   Meccariello, Bernita Raisin, DO  oxyCODONE-acetaminophen (PERCOCET) 10-325 MG tablet Take 1 tablet by mouth 3 (three) times daily. 07/23/19   [provider]  potassium chloride (KLOR-CON) 10 MEQ tablet Take 10 mEq by mouth 2 (two) times daily. 01/24/20   [provider]  promethazine (PHENERGAN) 6.25 MG/5ML syrup Take 6.25 mg by mouth every 6 (six) hours as needed. 12/18/20   [provider]  RESTASIS 0.05 % ophthalmic emulsion SMARTSIG:1 Drop(s) In Eye(s) Every 12 Hours 11/03/20   [provider]  SYMBICORT 160-4.5 MCG/ACT inhaler Inhale into the lungs.  12/25/20   [provider]  traZODone (DESYREL) 100 MG tablet TAKE 2 TABLETS BY MOUTH ONCE AT BEDTIME 05/31/19   Meccariello, Bernita Raisin, DO  Vitamin D, Ergocalciferol, (DRISDOL) 50000 units CAPS capsule TAKE 1 CAPSULE BY MOUTH EVERY 7 (SEVEN) DAYS. 12/09/17   Meccariello, Bernita Raisin, DO  ferrous sulfate 325 (65 FE) MG tablet Take 1 tablet (325 mg total) by mouth 2 (two) times daily with a meal. 02/22/19 04/14/19  Shirley, [place], DO  loratadine (CLARITIN) 10 MG tablet Take 1 tablet (10 mg total) by mouth daily. 04/11/19 08/12/19  Meccariello, Bernita Raisin, DO    Allergies    Soma [carisoprodol], Nortriptyline, and Prednisone  Review of Systems   Review of Systems  Constitutional:  Negative for chills and fever.  HENT:  Negative for congestion.   Respiratory:  Negative for shortness of breath.   Cardiovascular:  Negative for chest pain.  Gastrointestinal:  Negative for abdominal pain.  Genitourinary:  Negative for enuresis.  Musculoskeletal:  Positive for back pain.  Skin:  Negative for rash.  Neurological:  Negative for dizziness and headaches.  Hematological:  Does not bruise/bleed easily.   Physical Exam Updated Vital Signs BP 139/66   Pulse 82   Temp 97.9 F (36.6 C) (Oral)   Resp 18   SpO2 95%   Physical Exam Vitals and nursing note reviewed.  Constitutional:      General: She is not in acute distress.    Appearance: She is not ill-appearing.  HENT:     Head: Normocephalic and atraumatic.     Nose: No congestion.  Eyes:     Conjunctiva/sclera: Conjunctivae normal.  Cardiovascular:     Rate and Rhythm: Normal rate and regular rhythm.     Pulses: Normal pulses.     Heart sounds: No murmur heard.   No friction rub. No gallop.  Pulmonary:     Effort: No respiratory distress.     Breath sounds: No wheezing, rhonchi or rales.  Musculoskeletal:  Comments: Spine was palpated was nontender to palpation, she has slight tenderness to palpation within the musculature  surrounding left iliac crest.  Patient has full range of motion 4/5  strength in the left hip flexor versus 5 out of 5 strength in the right, 5/5 strength in the knees ankle and toes.  Neurovascularly intact.  Positive straight leg raise.  Skin:    General: Skin is warm and dry.  Neurological:     Mental Status: She is alert.  Psychiatric:        Mood and Affect: Mood normal.    ED Results / Procedures / Treatments   Labs (all labs ordered are listed, but only abnormal results are displayed) Labs Reviewed - No data to display  EKG None  Radiology CT Lumbar Spine Wo Contrast  Result Date: 01/09/2021 CLINICAL DATA:  Back trauma, no prior imaging (Age >= 16y) EXAM: CT LUMBAR SPINE WITHOUT CONTRAST TECHNIQUE: Multidetector CT imaging of the lumbar spine was performed without intravenous contrast administration. Multiplanar CT image reconstructions were also generated. COMPARISON:  None. FINDINGS: Segmentation: 5 lumbar type vertebrae. Alignment: No significant listhesis. Vertebrae: Vertebral body heights are maintained. No acute fracture. Paraspinal and other soft tissues: 3 mm nonobstructing calculus within the collecting system of the left kidney. Disc levels: L3-L4: Disc space narrowing. Disc bulge with endplate osteophytic ridging. Facet hypertrophy and ligamentum flavum thickening. Mild canal stenosis. Mild to moderate foraminal stenosis. L4-L5: Disc space narrowing with vacuum phenomenon. Disc bulge with endplate osteophytic ridging. Facet hypertrophy and ligamentum flavum thickening. Moderate canal stenosis. Mild to moderate foraminal stenosis. L5-S1: Disc space narrowing with vacuum phenomenon. Disc bulge with endplate osteophytic ridging. Facet hypertrophy and ligamentum flavum thickening. Moderate canal stenosis. Moderate to marked foraminal stenosis. IMPRESSION: No acute fracture. Lower lumbar degenerative changes. Moderate canal stenosis. Foraminal narrowing greatest at L5-S1. 3 mm  nonobstructing left renal calculus. Electronically Signed   By: Macy Mis M.D.   On: 01/09/2021 15:03    Procedures Procedures   Medications Ordered in ED Medications  HYDROmorphone (DILAUDID) injection 1 mg (1 mg Intramuscular Given 01/09/21 1413)  ondansetron (ZOFRAN-ODT) disintegrating tablet 4 mg (4 mg Oral Given 01/09/21 1414)    ED Course  I have reviewed the triage vital signs and the nursing notes.  Pertinent labs & imaging results that were available during my care of the patient were reviewed by me and considered in my medical decision making (see chart for details).    MDM Rules/Calculators/A&P                          Initial impression-patient presents with acute on chronic left lower back pain.  She is alert, does not appear in acute stress, vital signs reassuring.  Suspect muscular strain will provide patient with pain medications, imaging and reassess.  Work-up-CT lumbar spine negative for acute findings.  Reassessment-patient was reassessed states she is feeling better, patient was able to ambulate she is able to walk down the hallway and use restroom without difficulty.  Patient was agreed for discharge at that time.  Rule out- I have low suspicion for spinal fracture or spinal cord abnormality as patient denies urinary incontinency, retention, difficulty with bowel movements, denies saddle paresthesias.  Spine was palpated there is no step-off, crepitus or gross deformities felt, patient had  full range of motion, neurovascular fully intact in the lower extremities.  Imaging negative for fractures or dislocation. Low suspicion for septic arthritis as patient denies  IV drug use, skin exam was performed no erythematous, edema or warm joints noted.  Low suspicion for kidney stone, [place], UTI patient denies any urinary symptoms, has no CVA tenderness present my exam.  Low suspicion for AAA or dissection of the aorta as presentation is atypical of  etiology.   Plan-  Back pain improved-likely is acute on chronic muscular strain, patient currently has narcotics, Flexeril, recommended prednisone but patient states this gives her reaction. will provide her with meloxicam, lidocaine patch follow-up with neurosurgery for further evaluation.  Vital signs have remained stable, no indication for hospital admission.   Patient given at home care as well strict return precautions.  Patient verbalized that they understood agreed to said plan.  Addendum-notified by nursing staff that patient was concerned that she has been sent home despite having back pain.  I went and reassessed the patient.  Patient states she has pain only when she sits down, I offered her additional pain medication but she deferred this stating that if the pain will not be fully relieved she does not want it.  Unfortunately I explained that I will not be able  to fully alleviate all of  her pain as this will take time for her symptoms to go away fully.  I recommend that she follows up with her neuro surgeon for further evaluation or come back if symptoms continue to get worse.  Final Clinical Impression(s) / ED Diagnoses Final diagnoses:  Acute left-sided low back pain with left-sided sciatica    Rx / DC Orders ED Discharge Orders          Ordered    meloxicam (MOBIC) 7.5 MG tablet  Daily        01/09/21 1523    lidocaine (LIDODERM) 5 %  Every 24 hours        01/09/21 1542             Marcello Fennel, PA-C 01/09/21 1526    Marcello Fennel, PA-C 01/09/21 1549    Dorie Rank, MD 01/11/21 [phone]

## 2021-01-11 ENCOUNTER — Emergency Department (HOSPITAL_COMMUNITY)
Admission: EM | Admit: 2021-01-11 | Discharge: 2021-01-11 | Disposition: A | Discharge status: Home / Self Care | Payer: Medicare Other | Attending: Emergency Medicine | Admitting: Emergency Medicine

## 2021-01-11 ENCOUNTER — Encounter (HOSPITAL_COMMUNITY): Payer: Self-pay | Admitting: Emergency Medicine

## 2021-01-11 DIAGNOSIS — M79606 Pain in leg, unspecified: Secondary | ICD-10-CM | POA: Diagnosis not present

## 2021-01-11 DIAGNOSIS — M25552 Pain in left hip: Secondary | ICD-10-CM | POA: Insufficient documentation

## 2021-01-11 DIAGNOSIS — M791 Myalgia, unspecified site: Secondary | ICD-10-CM | POA: Insufficient documentation

## 2021-01-11 DIAGNOSIS — M5432 Sciatica, left side: Secondary | ICD-10-CM | POA: Insufficient documentation

## 2021-01-11 DIAGNOSIS — M79605 Pain in left leg: Secondary | ICD-10-CM

## 2021-01-11 DIAGNOSIS — Z7951 Long term (current) use of inhaled steroids: Secondary | ICD-10-CM | POA: Insufficient documentation

## 2021-01-11 DIAGNOSIS — R5381 Other malaise: Secondary | ICD-10-CM | POA: Diagnosis not present

## 2021-01-11 DIAGNOSIS — I10 Essential (primary) hypertension: Secondary | ICD-10-CM | POA: Insufficient documentation

## 2021-01-11 DIAGNOSIS — R200 Anesthesia of skin: Secondary | ICD-10-CM | POA: Insufficient documentation

## 2021-01-11 DIAGNOSIS — Z7984 Long term (current) use of oral hypoglycemic drugs: Secondary | ICD-10-CM | POA: Diagnosis not present

## 2021-01-11 DIAGNOSIS — E119 Type 2 diabetes mellitus without complications: Secondary | ICD-10-CM | POA: Insufficient documentation

## 2021-01-11 DIAGNOSIS — M79661 Pain in right lower leg: Secondary | ICD-10-CM | POA: Diagnosis not present

## 2021-01-11 DIAGNOSIS — M5410 Radiculopathy, site unspecified: Secondary | ICD-10-CM

## 2021-01-11 DIAGNOSIS — Z79899 Other long term (current) drug therapy: Secondary | ICD-10-CM | POA: Insufficient documentation

## 2021-01-11 DIAGNOSIS — M5416 Radiculopathy, lumbar region: Secondary | ICD-10-CM | POA: Diagnosis not present

## 2021-01-11 MED ORDER — CYCLOBENZAPRINE HCL 10 MG PO TABS
10.0000 mg | ORAL_TABLET | Freq: Once | ORAL | Status: AC
  Administered 2021-01-11: 10 mg via ORAL
  Filled 2021-01-11: qty 1

## 2021-01-11 MED ORDER — MELOXICAM 15 MG PO TABS: 15.0000 mg | ORAL_TABLET | Freq: Every day | ORAL | 0 refills | Status: AC

## 2021-01-11 MED ORDER — GABAPENTIN 300 MG PO CAPS
600.0000 mg | ORAL_CAPSULE | Freq: Once | ORAL | Status: AC
  Administered 2021-01-11: 600 mg via ORAL
  Filled 2021-01-11: qty 2

## 2021-01-11 MED ORDER — OXYCODONE-ACETAMINOPHEN 5-325 MG PO TABS
1.0000 | ORAL_TABLET | Freq: Once | ORAL | Status: AC
  Administered 2021-01-11: 1 via ORAL
  Filled 2021-01-11: qty 1

## 2021-01-11 MED ORDER — DEXAMETHASONE SODIUM PHOSPHATE 10 MG/ML IJ SOLN
10.0000 mg | Freq: Once | INTRAMUSCULAR | Status: AC
  Administered 2021-01-11: 10 mg via INTRAVENOUS
  Filled 2021-01-11: qty 1

## 2021-01-11 MED ORDER — KETOROLAC TROMETHAMINE 30 MG/ML IJ SOLN
30.0000 mg | Freq: Once | INTRAMUSCULAR | Status: AC
  Administered 2021-01-11: 30 mg via INTRAMUSCULAR
  Filled 2021-01-11: qty 1

## 2021-01-11 MED ORDER — OXYCODONE HCL 5 MG PO TABS
5.0000 mg | ORAL_TABLET | Freq: Once | ORAL | Status: AC
  Administered 2021-01-11: 5 mg via ORAL
  Filled 2021-01-11: qty 1

## 2021-01-11 MED ORDER — HYDROMORPHONE HCL 1 MG/ML IJ SOLN
1.0000 mg | Freq: Once | INTRAMUSCULAR | Status: AC
  Administered 2021-01-11: 1 mg via INTRAMUSCULAR
  Filled 2021-01-11: qty 1

## 2021-01-11 NOTE — ED Provider Notes (Signed)
I provided a substantive portion of the care of this patient.  I personally performed the entirety of the history for this encounter.     Had prior history of L5-S1 decompression in 2016.  She reports she is getting pain again that is consistent with sciatica.  She reports she has a lot of pain in the right buttock that shoots down her leg all the way to the foot.  She reports it is not a comfortable position.  She cannot lie flat on her back.  She reports it is hard to walk because of so painful.  She was seen in the emergency department 2 days ago and is trying Lidoderm patches and Flexeril.  She is not getting much relief.  Patient is alert and nontoxic.  Mental status clear.  No respiratory distress.  She endorses discomfort at the [facility] joint on the right.  Patient is neurovascular intact.  Good pedal pulses.  Foot is warm and dry.  She does have intact strength for extension against resistance.  At this time will add Decadron.  I agree with plan of management.  Patient reports she can call [facility] neurosurgery tomorrow to schedule follow-up.   Charlesetta Shanks, MD 01/11/21 1459

## 2021-01-11 NOTE — Discharge Instructions (Addendum)
As we discussed today your meloxicam was restarted at 7.5 mg.  Please take 2 of these every day to make a total of 15 mg.  I have additionally sent a prescription for additional doses of meloxicam to help you get by until you are able to follow-up with your primary care team.  Do not take any other NSAID medications such as Advil, Aleve, Motrin, naproxen, or ibuprofen.  These are in the same class and will not provide you any extra benefit.  DO NOT TAKE THE MELOXICAM IF YOU ARE TAKING THE ETODOLAC/ LODINE.  This is also a NSAID medication.   We discussed your gabapentin dosing, and with the dose you are taking I would not recommend an increase from the emergency room.  Additionally as we discussed lidocaine patches only work for 12 hours.  You can place a patch on and then after 12 hours need to remove it and leave it off for 12 hours before placing a new patch. While you have the lidocaine patch on please do not use any hot or cold in the area.  These clean your skin after taking the lidocaine patch off before you do use any hot or cold therapy.  We discussed other medication changes such as changing your muscle relaxer which you declined.   You were given a dose of your Flexeril and your gabapentin, in addition to a shot of Dilaudid, a shot of Toradol, and your home pain medicine of oxycodone.  You were given a shot of a long acting steroid to try and decrease inflammation.   I offered you a prescription for a rolling walker which you declined.  As we discussed I suspect that you have 2 areas where your nerve is being pinched.  Based on your CT results from the other day it appears that your nerves are being squeezed when they come out of your spine.  I also suspect that you have something called piriformis syndrome which, as we discussed, is a spasm of one of the muscles in your buttock that can then press on nerves, most likely as a result of your attempting to help someone up.  I suspect that with  this double pinching that is why you are having your pain.  I will be placing a referral to neurosurgery to try and expedite your evaluation.  Please call them on Monday.  If they require additional evaluation or information from insurance for a formal referral then this will need to come from your primary care doctor.    Today you received medications that may make you sleepy or impair your ability to make decisions.  For the next 24 hours please do not drive, operate heavy machinery, care for a small child with out another adult present, or perform any activities that may cause harm to you or someone else if you were to fall asleep or be impaired.

## 2021-01-11 NOTE — ED Provider Notes (Signed)
[facility] DEPT Provider Note   CSN: [id number] Arrival date & time: 01/11/21  3559     History Chief Complaint  Patient presents with   Back Pain   Hip Pain    Norma [name] is a 60 y.o. female.  With past medical history including arthritis, lumbar spinal stenosis S/P lumbar laminectomy/decompression of L5-S1 in 2016 who presents emergency department with lower back and left hip pain.  States that she has right-sided sciatica.  States that this began 3 days ago.  No inciting event or trauma.  Denies fall.  States that she was here 2 days ago on 01/09/2021 for same complaint.  They gave her Mobic and lidocaine patches which she states has not controlled the pain.  States that she has had impaired ability to walk and sitting makes the pain worse.  States that she can only find relief when she is laying on her right side with a pillow between her legs.  Denies fevers, swelling to the leg, only anesthesia, IV drug use, incontinence of bowel or bladder.  On chart review she was seen on 01/09/2021 at [facility] for similar complaint.  While she was here they obtained CT lumbar spine which was negative.  She received IM Dilaudid with moderate relief of symptoms.  Pain likely attributed to acute on chronic muscular strain her previous notes.  States that she already has narcotics, Flexeril.  They recommended prednisone but patient stated that she had a reaction.  They encouraged her to follow-up with neurosurgery for further evaluation.  There is a note that states that prior to her leaving she had recurrence of pain.  She was prescribed Mobic and lidocaine patches for home.    Back Pain Associated symptoms: numbness   Associated symptoms: no fever   Hip Pain  Past Medical History:  Diagnosis Date   A-fib (HCC)    Acid reflux    Allergy    Anemia    Anxiety    Arthritis    Back pain    Depression    Diabetes mellitus without complication ([place])    on  meds   Dysrhythmia    Headache    stress headaches, migraines at times   Heart murmur    Hypertension    Irregular heart beat    Left shoulder pain    Shortness of breath dyspnea    with exertion   Tachyarrhythmia    Trigger point of thoracic region 03/22/2012    Patient Active Problem List   Diagnosis Date Noted   Tendinopathy of right rotator cuff 04/04/2020   HCV antibody positive 03/18/2020   RUQ pain 74/16/3845   Biliary colic symptom    Elevated liver enzymes    Abnormal findings on diagnostic imaging of liver and biliary tract    Abdominal pain 03/16/2020   Cholelithiasis 03/16/2020   Hypertensive urgency 03/16/2020   Type 2 diabetes mellitus ([place]) 03/16/2020   Lead exposure 11/14/2019   TMJ (temporomandibular joint disorder) 07/31/2019   Left hip pain 02/27/2019   Uterine mass 12/19/2018   Allergic rhinitis with postnasal drip 11/17/2018   Diverticulosis of colon without hemorrhage 06/23/2018   Intractable migraine without aura and without status migrainosus 02/16/2018   Iron deficiency anemia 03/29/2017   Vitamin D deficiency 03/01/2017   Lumbar herniated disc 06/06/2014   Depression    Prediabetes 01/14/2014   Spinal stenosis, lumbar region, with neurogenic claudication 11/21/2013   Hot flashes 06/30/2011   Eczema, dyshidrotic 06/02/2010  FIBROIDS, UTERUS 10/24/2009   GERD 06/09/2009   Chronic pain syndrome 10/04/2008   Hepatitis C carrier ([place]) 03/21/2007   Obesity 06/16/2006   HYPERTENSION, BENIGN SYSTEMIC 06/16/2006    Past Surgical History:  Procedure Laterality Date   ANKLE SURGERY     CHOLECYSTECTOMY N/A 03/20/2020   Procedure: LAPAROSCOPIC CHOLECYSTECTOMY WITH INTRAOPERATIVE CHOLANGIOGRAM AND WEDGE LIVER BIOPSY;  Surgeon: [name], MD;  Location: [facility];  Service: General;  Laterality: N/A;   KNEE SURGERY Left    LUMBAR LAMINECTOMY/DECOMPRESSION MICRODISCECTOMY Left 06/06/2014   Procedure: LUMBAR LAMINECTOMY/DECOMPRESSION MICRODISCECTOMY 1  LEVEL;  Surgeon: Newman Pies, MD;  Location: [facility];  Service: Neurosurgery;  Laterality: Left;  Left L5S1 microdiskectomy   ROTATOR CUFF REPAIR Left 02/20/14   TUBAL LIGATION       OB History     Gravida  4   Para  4   Term  4   Preterm      AB  0   Living  4      SAB  0   IAB  0   Ectopic  0   Multiple  0   Live Births  4           Family History  Problem Relation Age of Onset   Diabetes Brother    Diabetes Maternal Aunt    Diabetes Paternal Aunt    Diabetes Maternal Grandmother    Diabetes Maternal Grandfather    Diabetes Paternal Grandmother    Diabetes Paternal Grandfather    Congestive Heart Failure Mother    Hypertension Son    Colon cancer Neg Hx    Esophageal cancer Neg Hx    Rectal cancer Neg Hx    Stomach cancer Neg Hx    Social History   Tobacco Use   Smoking status: Never   Smokeless tobacco: Never  Vaping Use   Vaping Use: Never used  Substance Use Topics   Alcohol use: Yes    Comment: glass of wine occasionally   Drug use: No    Comment: States no longer uses marijuana   Home Medications Prior to Admission medications   Medication Sig Start Date End Date Taking? Authorizing Provider  albuterol (VENTOLIN HFA) 108 (90 Base) MCG/ACT inhaler SMARTSIG:1-2 Puff(s) Via Inhaler Every 4-6 Hours PRN 08/25/20   [provider]  atorvastatin (LIPITOR) 20 MG tablet Take 20 mg by mouth daily. 11/18/20   [provider]  buPROPion (WELLBUTRIN XL) 300 MG 24 hr tablet Take 300 mg by mouth daily.    [provider]  cetirizine (ZYRTEC ALLERGY) 10 MG tablet Take 1 tablet (10 mg total) by mouth daily. 11/07/20   Hazel Sams, PA-C  cyclobenzaprine (FLEXERIL) 10 MG tablet TAKE 1 TABLET BY MOUTH TWICE A DAY AS NEEDED FOR MUSCLE SPASMS 12/23/17   Meccariello, Bernita Raisin, DO  diclofenac Sodium (VOLTAREN) 1 % GEL Please apply 1-2 times daily to affected area 10/09/19   [name], MD  EPINEPHrine 0.3 mg/0.3 mL IJ SOAJ  injection Inject into the muscle as directed. 07/22/20   [provider]  etodolac (LODINE) 400 MG tablet Take 400 mg by mouth 2 (two) times daily. 12/02/20   [provider]  fluticasone (FLONASE) 50 MCG/ACT nasal spray Place 2 sprays into both nostrils daily. 10/15/19   Meccariello, Bernita Raisin, DO  gabapentin (NEURONTIN) 300 MG capsule Take 1 capsule by mouth 3 (three) times daily. 09/25/20   [provider]  hydrochlorothiazide (HYDRODIURIL) 25 MG tablet Take  1 tablet (25 mg total) by mouth daily. 11/02/16   Everrett Coombe, MD  ibuprofen (ADVIL) 600 MG tablet Take 1 tablet (600 mg total) by mouth every 6 (six) hours as needed. Take with breakfast for 5 days. Can continue for longer if symptoms persist. 11/07/20   Hazel Sams, PA-C  lidocaine (LIDODERM) 5 % Place 1 patch onto the skin daily for 7 days. Remove & Discard patch within 12 hours or as directed by MD 01/09/21 01/16/21  Marcello Fennel, PA-C  losartan (COZAAR) 50 MG tablet Take 50 mg by mouth daily. 01/24/20   [provider]  meloxicam (MOBIC) 7.5 MG tablet Take 1 tablet (7.5 mg total) by mouth daily for 7 days. 01/09/21 01/16/21  Marcello Fennel, PA-C  metFORMIN (GLUCOPHAGE) 1000 MG tablet Take 1,000 mg by mouth 2 (two) times daily. 10/30/20   [provider]  metoprolol tartrate (LOPRESSOR) 25 MG tablet Take 1 tablet (25 mg total) by mouth 2 (two) times daily. 05/13/14   [name], MD  multivitamin-iron-minerals-folic acid (CENTRUM) chewable tablet Chew 1 tablet daily by mouth. 03/01/17   [name], MD  naloxone [facility]) nasal spray 4 mg/0.1 mL SMARTSIG:Both Nares 12/29/20   [provider]  olopatadine (PATANOL) 0.1 % ophthalmic solution SMARTSIG:1-2 Drop(s) In Eye(s) Twice Daily PRN 10/27/20   [provider]  omeprazole (PRILOSEC) 20 MG capsule Take 2 capsules (40 mg total) by mouth daily. 05/25/19   Meccariello, Bernita Raisin, DO  oxyCODONE-acetaminophen (PERCOCET)  10-325 MG tablet Take 1 tablet by mouth 3 (three) times daily. 07/23/19   [provider]  potassium chloride (KLOR-CON) 10 MEQ tablet Take 10 mEq by mouth 2 (two) times daily. 01/24/20   [provider]  promethazine (PHENERGAN) 6.25 MG/5ML syrup Take 6.25 mg by mouth every 6 (six) hours as needed. 12/18/20   [provider]  RESTASIS 0.05 % ophthalmic emulsion SMARTSIG:1 Drop(s) In Eye(s) Every 12 Hours 11/03/20   [provider]  SYMBICORT 160-4.5 MCG/ACT inhaler Inhale into the lungs. 12/25/20   [provider]  traZODone (DESYREL) 100 MG tablet TAKE 2 TABLETS BY MOUTH ONCE AT BEDTIME 05/31/19   Meccariello, Bernita Raisin, DO  Vitamin D, Ergocalciferol, (DRISDOL) 50000 units CAPS capsule TAKE 1 CAPSULE BY MOUTH EVERY 7 (SEVEN) DAYS. 12/09/17   Meccariello, Bernita Raisin, DO  ferrous sulfate 325 (65 FE) MG tablet Take 1 tablet (325 mg total) by mouth 2 (two) times daily with a meal. 02/22/19 04/14/19  Shirley, [place], DO  loratadine (CLARITIN) 10 MG tablet Take 1 tablet (10 mg total) by mouth daily. 04/11/19 08/12/19  Meccariello, Bernita Raisin, DO    Allergies    Soma [carisoprodol], Nortriptyline, and Prednisone  Review of Systems   Review of Systems  Constitutional:  Negative for appetite change and fever.  Musculoskeletal:  Positive for back pain.  Neurological:  Positive for numbness.  All other systems reviewed and are negative.  Physical Exam Updated Vital Signs BP 104/88   Pulse (!) 109   Temp 98.6 F (37 C) (Oral)   Resp 18   SpO2 94%   Physical Exam Vitals and nursing note reviewed.  Constitutional:      General: She is not in acute distress.    Appearance: Normal appearance. She is not toxic-appearing.  HENT:     Head: Normocephalic and atraumatic.  Eyes:     General: No scleral icterus. Cardiovascular:     Pulses:  Dorsalis pedis pulses are 2+ on the right side and 2+ on the left side.  Pulmonary:     Effort: Pulmonary effort is  normal. No respiratory distress.  Musculoskeletal:        General: Tenderness present. No swelling.     Cervical back: Normal range of motion and neck supple. No tenderness.     Right lower leg: Tenderness present. No edema.     Left lower leg: No edema.     Comments: Positive straight leg test on the right.  I am able to extend and flex the knee with moderate pain that she states starts in her right buttock and radiates down to her right foot.  She does have mild tenderness to palpation of the right hip joint, but states this is chronic.  Skin:    General: Skin is warm and dry.     Capillary Refill: Capillary refill takes less than 2 seconds.     Findings: No rash.  Neurological:     General: No focal deficit present.     Mental Status: She is alert and oriented to person, place, and time. Mental status is at baseline.     Cranial Nerves: Cranial nerves are intact.     Sensory: Sensation is intact.  Psychiatric:        Mood and Affect: Mood normal.        Behavior: Behavior normal.        Thought Content: Thought content normal.        Judgment: Judgment normal.    ED Results / Procedures / Treatments   Labs (all labs ordered are listed, but only abnormal results are displayed) Labs Reviewed - No data to display  EKG None  Radiology CT Lumbar Spine Wo Contrast  Result Date: 01/09/2021 CLINICAL DATA:  Back trauma, no prior imaging (Age >= 16y) EXAM: CT LUMBAR SPINE WITHOUT CONTRAST TECHNIQUE: Multidetector CT imaging of the lumbar spine was performed without intravenous contrast administration. Multiplanar CT image reconstructions were also generated. COMPARISON:  None. FINDINGS: Segmentation: 5 lumbar type vertebrae. Alignment: No significant listhesis. Vertebrae: Vertebral body heights are maintained. No acute fracture. Paraspinal and other soft tissues: 3 mm nonobstructing calculus within the collecting system of the left kidney. Disc levels: L3-L4: Disc space narrowing. Disc  bulge with endplate osteophytic ridging. Facet hypertrophy and ligamentum flavum thickening. Mild canal stenosis. Mild to moderate foraminal stenosis. L4-L5: Disc space narrowing with vacuum phenomenon. Disc bulge with endplate osteophytic ridging. Facet hypertrophy and ligamentum flavum thickening. Moderate canal stenosis. Mild to moderate foraminal stenosis. L5-S1: Disc space narrowing with vacuum phenomenon. Disc bulge with endplate osteophytic ridging. Facet hypertrophy and ligamentum flavum thickening. Moderate canal stenosis. Moderate to marked foraminal stenosis. IMPRESSION: No acute fracture. Lower lumbar degenerative changes. Moderate canal stenosis. Foraminal narrowing greatest at L5-S1. 3 mm nonobstructing left renal calculus. Electronically Signed   By: Macy Mis M.D.   On: 01/09/2021 15:03    Procedures Procedures   Medications Ordered in ED Medications  dexamethasone (DECADRON) injection 10 mg (has no administration in time range)  HYDROmorphone (DILAUDID) injection 1 mg (has no administration in time range)  ketorolac (TORADOL) 30 MG/ML injection 30 mg (30 mg Intramuscular Given 01/11/21 1310)    ED Course  I have reviewed the triage vital signs and the nursing notes.  Pertinent labs & imaging results that were available during my care of the patient were reviewed by me and considered in my medical decision making (see chart for details).  MDM Rules/Calculators/A&P 60 year old female who presents emergency department with left-sided sciatica.  Was recently seen on 01/09/2021 for the same complaint.  She was prescribed lidocaine and Mobic at that time without relief of symptoms.  She had CT imaging done 2 days ago with no acute fractures, lower lumbar degenerative changes, moderate canal stenosis.  While she was here given 30 mg IM Toradol with moderate relief of symptoms.  On reassessment of pain she is resting comfortably.  States she is having improvement in symptoms  however still having some pain with movement.  We will give 1 mg IM Dilaudid.  I also spoke with pharmacist about her prednisone allergy.  It appears that she has been given Decadron IV intraoperatively without sign of reaction.  So we will give her Decadron 10 mg IM while here in the emergency department and reassess.   I spoke with her at bedside that we will likely not be able to completely absolve her pain symptoms.  She does not currently see a neurosurgeon, and I discussed that I will have her follow-up with [place] neurosurgery for continued management.  On PDMP review it appears that she gets monthly oxycodone.  She also has a prescription for Flexeril and gabapentin.  Stated above she has been given a prescription for meloxicam and lidocaine patches.  Because of this we will not re-prescribe other narcotics or muscle relaxants. Encouraged her to also try ice or heat at home along with gentle stretching.  I discussed this case with my attending physician who cosigned this note including patient's presenting symptoms, physical exam, and planned diagnostics and interventions. Attending physician stated agreement with plan or made changes to plan which were implemented.   Attending physician assessed patient at bedside.   At time of handoff to [facility] PA-C, patient receiving IM Dilaudid and IM Decadron.  Will reassess her pain and discharge with close neurosurgery follow-up. Final Clinical Impression(s) / ED Diagnoses Final diagnoses:  Sciatica of left side    Rx / DC Orders ED Discharge Orders     None        Mickie Hillier, PA-C 01/11/21 1518    Charlesetta Shanks, MD 02/04/21 1652

## 2021-01-11 NOTE — ED Notes (Signed)
PA at bedside.

## 2021-01-11 NOTE — ED Notes (Signed)
Patient able to raise to a standing position to get to steady, lower herself onto toilet, and raise herself to a standing position from toilet. Patient placed into wheelchair for discharge. Patient stating she cannot walk. PA notified and will come speak with patient.

## 2021-01-11 NOTE — ED Provider Notes (Addendum)
I assumed care of patient at shift change from previous team, please see their note for full H&P.  Briefly patient is here for acute on chronic pain.  Plan established by previous team: "60 year old female who presents emergency department with left-sided sciatica.   Was recently seen on 01/09/2021 for the same complaint.  She was prescribed lidocaine and Mobic at that time without relief of symptoms.  She had CT imaging done 2 days ago with no acute fractures, lower lumbar degenerative changes, moderate canal stenosis.  While she was here given 30 mg IM Toradol with moderate relief of symptoms.  On reassessment of pain she is resting comfortably.  States she is having improvement in symptoms however still having some pain with movement.  We will give 1 mg IM Dilaudid.  I also spoke with pharmacist about her prednisone allergy.  It appears that she has been given Decadron IV intraoperatively without sign of reaction.  So we will give her Decadron 10 mg IM while here in the emergency department and reassess.    I spoke with her at bedside that we will likely not be able to completely absolve her pain symptoms.  She does not currently see a neurosurgeon, and I discussed that I will have her follow-up with [place] [facility] for continued management.   On PDMP review it appears that she gets monthly oxycodone.  She also has a prescription for Flexeril and gabapentin.  Stated above she has been given a prescription for meloxicam and lidocaine patches.  Because of this we will not re-prescribe other narcotics or muscle relaxants. Encouraged her to also try ice or heat at home along with gentle stretching."  1644: I reevaluated patient.  She states that she has had some improvement in her pain after the medicines.  She states that she is hungry and ready to go home at this time.  Recommended that she obtain neurosurgery follow-up and follow-up with the provider who manages her baseline pain  medications.  Return precautions were discussed with patient who states their understanding.  At the time of discharge patient denied any unaddressed complaints or concerns.  Patient is agreeable for discharge home.  Note: Portions of this report may have been transcribed using voice recognition software. Every effort was made to ensure accuracy; however, inadvertent computerized transcription errors may be present         Lorin Glass, Hershal Coria 01/11/21 1644  Addendum 2020: I just had a extensive discussion with patient regarding her CT spine results from her previous visit, her medications, options to change her medicines. She does not wish to change her Flexeril.  We discussed that her meloxicam was restarted at 7.5 mg, she will return to her usual 15 mg and I will give her a prescription to ensure this last until she is able to follow-up with her primary team. She states that she is taking her gabapentin 600 mg 3 times daily, and at that dose I am hesitant to significantly increase it.  I went over her CT spine results, and we discussed the findings of moderate canal stenosis with moderate to marked foraminal stenosis primarily at L5-S1 and L4-L5 which I suspect is causing the majority of her symptoms combined with piriformis syndrome from her picking her mother up.  We discussed not doubling NSAIDs, that if she is taking meloxicam she should not be taking ibuprofen also.  Additionally will attempt to place referral to neurosurgery.  She does not have any changes to bowel or bladder  function, no saddle anesthesia or paresthesia and no evidence of cauda equina syndrome.  She does not appear to have a indication for an emergent MRI tonight.    She has been using crutches at home to help her get around.  I offered her a prescription for a rolling walker which she declined.  I will give her a dose of her gabapentin and her Flexeril.  She is agreeable for discharge home after the  gabapentin and flexeril.   She tells me that she has multiple children who are helping her at home.   Return precautions were discussed with patient who states their understanding.  At the time of discharge patient denied any unaddressed complaints or concerns.  Patient is agreeable for discharge home.  Note: Portions of this report may have been transcribed using voice recognition software. Every effort was made to ensure accuracy; however, inadvertent computerized transcription errors may be present     Ollen Gross 01/11/21 2118    Hayden Rasmussen, MD 01/12/21 1109

## 2021-01-11 NOTE — ED Notes (Addendum)
Daughter arrived to pick up patient. Daughter agitated and irate on arrival. Patient and daughter requesting names due to [place], "sending her home in this condition." This RN went to get updated discharge paperwork, then daughter proceeded to become increasingly agitated at the time it was taking to redo paperwork. During this time, patient was able to stand on her own in order to reposition herself in the wheelchair. Patient's daughter extremely verbally aggressive to staff regarding her mother's care, calling RN "a stupid fucking bitch" while exiting the department. After helping patient into car, daughter stated to this RN, "You tell your little friend that thinks I don't know what I'm talking about, that I'm an Therapist, sports at [employer], a Contractor, a Brewing technologist and I'm in [place] school. You tell her that."

## 2021-01-11 NOTE — ED Triage Notes (Signed)
Patient here via [facility] reporting back pain radiating down L leg. Hx of same. Muscle relaxants with no relief.

## 2021-01-11 NOTE — ED Notes (Signed)
This RN gave prescribed medications to pt.  Pt's daughter arrived to take mother home, demanding to speak with Provider.  Daughter informed that she would not be speaking to Provider and if she had questions she needed to ask the patient as the Provider explained everything at length.  Discharge papers given to pt by Clarise Cruz, RN.  Pt and daughter requesting names of all providers that saw them that day.  Clarise Cruz went to go have papers updated.  This RN standing by, daughter asked "are we just leaving out or what are we doing?"  This RN "She went to go get the updated discharge papers and the names that you so badly want, I'll go check."  Went to check on status and on other patients.  Returned to computer to document on other patients, daughter came up and said "Are we just being discharged or is anything else gonna happen?"  "Your nurse told you she was going to get the updated discharge papers"  "I know she told me but you didn't tell me anything and just walked away" "Ma'am, I'm not even your nurse, I was just trying to help."  Daughter walked off muttering something under breath, told charge nurse I was walking away for a minute, daughter yelled out "Yeah, you walk away."  This RN returned to computer after nurse was with the patient ready to take them outside.  Pt smirked at this RN and said "Bye girl."  This RN "you both have an excellent evening."  Daughter yelled "bitch" and flipped this RN off.

## 2021-01-23 DIAGNOSIS — M5442 Lumbago with sciatica, left side: Secondary | ICD-10-CM | POA: Diagnosis not present

## 2021-01-28 DIAGNOSIS — G8929 Other chronic pain: Secondary | ICD-10-CM | POA: Diagnosis not present

## 2021-01-28 DIAGNOSIS — Z9181 History of falling: Secondary | ICD-10-CM | POA: Diagnosis not present

## 2021-01-28 DIAGNOSIS — M5442 Lumbago with sciatica, left side: Secondary | ICD-10-CM | POA: Diagnosis not present

## 2021-01-28 DIAGNOSIS — M25532 Pain in left wrist: Secondary | ICD-10-CM | POA: Diagnosis not present

## 2021-01-28 DIAGNOSIS — Z79899 Other long term (current) drug therapy: Secondary | ICD-10-CM | POA: Diagnosis not present

## 2021-01-28 DIAGNOSIS — F32A Depression, unspecified: Secondary | ICD-10-CM | POA: Diagnosis not present

## 2021-01-28 DIAGNOSIS — M25511 Pain in right shoulder: Secondary | ICD-10-CM | POA: Diagnosis not present

## 2021-01-30 DIAGNOSIS — Z79899 Other long term (current) drug therapy: Secondary | ICD-10-CM | POA: Diagnosis not present

## 2021-02-04 DIAGNOSIS — Z86010 Personal history of colonic polyps: Secondary | ICD-10-CM | POA: Diagnosis not present

## 2021-02-05 ENCOUNTER — Other Ambulatory Visit: Payer: Self-pay | Admitting: Neurosurgery

## 2021-02-05 DIAGNOSIS — M5442 Lumbago with sciatica, left side: Secondary | ICD-10-CM

## 2021-02-13 DIAGNOSIS — Z743 Need for continuous supervision: Secondary | ICD-10-CM | POA: Diagnosis not present

## 2021-02-13 DIAGNOSIS — M79605 Pain in left leg: Secondary | ICD-10-CM | POA: Diagnosis not present

## 2021-02-17 ENCOUNTER — Ambulatory Visit: Payer: Medicare Other | Admitting: Family Medicine

## 2021-02-21 ENCOUNTER — Ambulatory Visit
Admission: RE | Admit: 2021-02-21 | Discharge: 2021-02-21 | Disposition: A | Discharge status: Home / Self Care | Payer: Medicare Other | Source: Ambulatory Visit | Attending: Neurosurgery | Admitting: Neurosurgery

## 2021-02-21 ENCOUNTER — Other Ambulatory Visit: Payer: Self-pay

## 2021-02-21 DIAGNOSIS — M5442 Lumbago with sciatica, left side: Secondary | ICD-10-CM

## 2021-02-21 DIAGNOSIS — M545 Low back pain, unspecified: Secondary | ICD-10-CM | POA: Diagnosis not present

## 2021-02-23 DIAGNOSIS — M5442 Lumbago with sciatica, left side: Secondary | ICD-10-CM | POA: Diagnosis not present

## 2021-02-23 DIAGNOSIS — I10 Essential (primary) hypertension: Secondary | ICD-10-CM | POA: Diagnosis not present

## 2021-02-23 DIAGNOSIS — G8929 Other chronic pain: Secondary | ICD-10-CM | POA: Diagnosis not present

## 2021-02-24 ENCOUNTER — Encounter: Payer: Self-pay | Admitting: Gastroenterology

## 2021-02-26 DIAGNOSIS — Z86010 Personal history of colonic polyps: Secondary | ICD-10-CM | POA: Diagnosis not present

## 2021-02-26 DIAGNOSIS — E119 Type 2 diabetes mellitus without complications: Secondary | ICD-10-CM | POA: Diagnosis not present

## 2021-02-26 DIAGNOSIS — Z1211 Encounter for screening for malignant neoplasm of colon: Secondary | ICD-10-CM | POA: Diagnosis not present

## 2021-02-27 DIAGNOSIS — Z9181 History of falling: Secondary | ICD-10-CM | POA: Diagnosis not present

## 2021-02-27 DIAGNOSIS — R600 Localized edema: Secondary | ICD-10-CM | POA: Diagnosis not present

## 2021-02-27 DIAGNOSIS — M25511 Pain in right shoulder: Secondary | ICD-10-CM | POA: Diagnosis not present

## 2021-02-27 DIAGNOSIS — F32A Depression, unspecified: Secondary | ICD-10-CM | POA: Diagnosis not present

## 2021-02-27 DIAGNOSIS — E119 Type 2 diabetes mellitus without complications: Secondary | ICD-10-CM | POA: Diagnosis not present

## 2021-02-27 DIAGNOSIS — Z79899 Other long term (current) drug therapy: Secondary | ICD-10-CM | POA: Diagnosis not present

## 2021-02-27 DIAGNOSIS — M25532 Pain in left wrist: Secondary | ICD-10-CM | POA: Diagnosis not present

## 2021-02-27 DIAGNOSIS — M5442 Lumbago with sciatica, left side: Secondary | ICD-10-CM | POA: Diagnosis not present

## 2021-03-03 DIAGNOSIS — Z79899 Other long term (current) drug therapy: Secondary | ICD-10-CM | POA: Diagnosis not present

## 2021-03-07 ENCOUNTER — Other Ambulatory Visit: Payer: Self-pay | Admitting: Neurosurgery

## 2021-03-07 DIAGNOSIS — G8929 Other chronic pain: Secondary | ICD-10-CM

## 2021-03-07 DIAGNOSIS — M5442 Lumbago with sciatica, left side: Secondary | ICD-10-CM

## 2021-03-16 ENCOUNTER — Ambulatory Visit (INDEPENDENT_AMBULATORY_CARE_PROVIDER_SITE_OTHER): Payer: Medicare Other | Admitting: Family Medicine

## 2021-03-16 ENCOUNTER — Other Ambulatory Visit: Payer: Self-pay

## 2021-03-16 VITALS — BP 156/94 | HR 95 | Ht 68.0 in | Wt 251.2 lb

## 2021-03-16 DIAGNOSIS — M25559 Pain in unspecified hip: Secondary | ICD-10-CM

## 2021-03-16 DIAGNOSIS — M25552 Pain in left hip: Secondary | ICD-10-CM | POA: Diagnosis not present

## 2021-03-16 NOTE — Progress Notes (Signed)
h   SUBJECTIVE:   CHIEF COMPLAINT / HPI:   Left Hip Pain Present for 6-8 weeks. Reports the pain starts in her L hip and radiates down back of leg. Described as sharp. She has a history of left sided sciatica and was seen in the ED for this in September. Had lumbar CT at that time that showed "lumbar degenerative changes, moderate canal stenosis, foraminal narrowing greatest at L5-S1".  Also had MRI on 02/21/21 which showed advanced multilevel degenerative disc disease most prominent at L4-L5 and L5-S1. She was referred to neurosurgery and reportedly saw them on 11/7. They would like to do additional imaging where they inject dye in her spine, but patient is slightly hesitant.   Takes Flexeril, Meloxicam, Gabapentin, Percocet, and voltaren gel with only slight improvement. Also has had various injections in the past without improvement. States she doesn't want anything for pain necessarily, just wants another opinion and to understand why she's hurting. Pain waxes and wanes-- some days she's ok but other days she has trouble getting out of bed. Does not want it to impact her ability to function long term.   PERTINENT  PMH / PSH: GERD, eczema, chronic pain syndrome, depression, HTN, T2DM  OBJECTIVE:   BP (!) 156/94   Pulse 95   Ht 5\' 8"  (1.727 m)   Wt 251 lb 3.2 oz (113.9 kg)   SpO2 100%   BMI 38.19 kg/m   General: NAD, pleasant, able to participate in exam Respiratory: No respiratory distress Skin: warm and dry, no rashes noted Psych: Normal affect and mood Neuro: grossly intact MSK: Back- no bony tenderness to palpation, no paraspinal muscle tenderness, full ROM w/flexion, extension, and rotation. Positive straight leg raise on left. L Hip- no tenderness to palpation, full ROM with flexion, extension, internal and external rotation. Negative FADDIR and FABER. Normal gait  ASSESSMENT/PLAN:   Left hip pain Seems to be acute on chronic. Symptoms most consistent with her sciatica  given positive straight leg raise, radiation of pain, and CT/MRI demonstrating degenerative changes w/foraminal narrowing. Less likely bursitis as she is nontender on exam. Could also consider intra-articular etiology of hip as she has had degenerative changes of hips seen on prior imaging. -Continue following with neurosurgery -Has lumbar myelography scheduled next week -Medical management options somewhat limited at this time, continue current medications as previously prescribed (percocet, gabapentin, voltaren gel, flexeril) -Encouraged referral to physical therapy but patient declined -Consider steroid injections w/neurosurgery or ortho although patient declines for now    Alcus Dad, MD [place]

## 2021-03-16 NOTE — Patient Instructions (Addendum)
It was great to see you!  I think most of your pain is related to your sciatica (your back arthritis has caused changes that compress some of the nerves coming from your spine and into the back of your leg).  You are doing the right thing by seeing a neurosurgeon and taking gabapentin, meloxicam, etc.  I have ordered a hip x-ray to see if there are any other causes/treatment options. I also think physical therapy is a good idea. Please let our office know if you change your mind about physical therapy.   Take care, Dr. Rock Nephew

## 2021-03-17 NOTE — Assessment & Plan Note (Addendum)
Seems to be acute on chronic. Symptoms most consistent with her sciatica given positive straight leg raise, radiation of pain, and CT/MRI demonstrating degenerative changes w/foraminal narrowing. Less likely bursitis as she is nontender on exam. Could also consider intra-articular etiology of hip as she has had degenerative changes of hips seen on prior imaging. -Continue following with neurosurgery -Has lumbar myelography scheduled next week -Medical management options somewhat limited at this time, continue current medications as previously prescribed (percocet, gabapentin, voltaren gel, flexeril) -Encouraged referral to physical therapy but patient declined -Consider steroid injections w/neurosurgery or ortho although patient declines for now

## 2021-03-18 ENCOUNTER — Ambulatory Visit
Admission: RE | Admit: 2021-03-18 | Discharge: 2021-03-18 | Disposition: A | Discharge status: Home / Self Care | Payer: Medicare Other | Source: Ambulatory Visit | Attending: Family Medicine | Admitting: Family Medicine

## 2021-03-18 ENCOUNTER — Ambulatory Visit
Admission: RE | Admit: 2021-03-18 | Discharge: 2021-03-18 | Disposition: A | Discharge status: Home / Self Care | Payer: Medicare Other | Source: Ambulatory Visit | Attending: Neurosurgery | Admitting: Neurosurgery

## 2021-03-18 ENCOUNTER — Other Ambulatory Visit: Payer: Self-pay

## 2021-03-18 ENCOUNTER — Telehealth: Payer: Self-pay | Admitting: *Deleted

## 2021-03-18 DIAGNOSIS — M25559 Pain in unspecified hip: Secondary | ICD-10-CM

## 2021-03-18 DIAGNOSIS — G8929 Other chronic pain: Secondary | ICD-10-CM

## 2021-03-18 DIAGNOSIS — M5442 Lumbago with sciatica, left side: Secondary | ICD-10-CM

## 2021-03-18 DIAGNOSIS — M47816 Spondylosis without myelopathy or radiculopathy, lumbar region: Secondary | ICD-10-CM | POA: Diagnosis not present

## 2021-03-18 DIAGNOSIS — M1612 Unilateral primary osteoarthritis, left hip: Secondary | ICD-10-CM | POA: Diagnosis not present

## 2021-03-18 MED ORDER — ONDANSETRON HCL 4 MG/2ML IJ SOLN: 4.0000 mg | Freq: Once | INTRAMUSCULAR | Status: DC | PRN

## 2021-03-18 MED ORDER — MEPERIDINE HCL 50 MG/ML IJ SOLN: 50.0000 mg | Freq: Once | INTRAMUSCULAR | Status: DC | PRN

## 2021-03-18 MED ORDER — DIAZEPAM 5 MG PO TABS: 10.0000 mg | ORAL_TABLET | Freq: Once | ORAL | Status: DC

## 2021-03-18 NOTE — Telephone Encounter (Signed)
Pt calling in wanting to know the results of her x-ray. Please advise. Regis Wiland Kennon Holter, CMA

## 2021-03-18 NOTE — Discharge Instructions (Signed)

## 2021-03-24 ENCOUNTER — Inpatient Hospital Stay
Admission: RE | Admit: 2021-03-24 | Discharge: 2021-03-24 | Disposition: A | Discharge status: Home / Self Care | Payer: Medicare Other | Source: Ambulatory Visit | Attending: Neurosurgery | Admitting: Neurosurgery

## 2021-03-24 ENCOUNTER — Other Ambulatory Visit: Payer: Medicare Other

## 2021-03-24 NOTE — Discharge Instructions (Signed)

## 2021-03-30 DIAGNOSIS — Z9181 History of falling: Secondary | ICD-10-CM | POA: Diagnosis not present

## 2021-03-30 DIAGNOSIS — M5442 Lumbago with sciatica, left side: Secondary | ICD-10-CM | POA: Diagnosis not present

## 2021-03-30 DIAGNOSIS — G8929 Other chronic pain: Secondary | ICD-10-CM | POA: Diagnosis not present

## 2021-03-30 DIAGNOSIS — M25511 Pain in right shoulder: Secondary | ICD-10-CM | POA: Diagnosis not present

## 2021-03-30 DIAGNOSIS — Z79899 Other long term (current) drug therapy: Secondary | ICD-10-CM | POA: Diagnosis not present

## 2021-03-30 DIAGNOSIS — E119 Type 2 diabetes mellitus without complications: Secondary | ICD-10-CM | POA: Diagnosis not present

## 2021-03-30 DIAGNOSIS — M25532 Pain in left wrist: Secondary | ICD-10-CM | POA: Diagnosis not present

## 2021-03-31 ENCOUNTER — Ambulatory Visit
Admission: RE | Admit: 2021-03-31 | Discharge: 2021-03-31 | Disposition: A | Discharge status: Home / Self Care | Payer: Medicare Other | Source: Ambulatory Visit | Attending: Neurosurgery | Admitting: Neurosurgery

## 2021-03-31 ENCOUNTER — Other Ambulatory Visit: Payer: Self-pay

## 2021-03-31 DIAGNOSIS — M4316 Spondylolisthesis, lumbar region: Secondary | ICD-10-CM | POA: Diagnosis not present

## 2021-03-31 DIAGNOSIS — M48061 Spinal stenosis, lumbar region without neurogenic claudication: Secondary | ICD-10-CM | POA: Diagnosis not present

## 2021-03-31 DIAGNOSIS — M5126 Other intervertebral disc displacement, lumbar region: Secondary | ICD-10-CM | POA: Diagnosis not present

## 2021-03-31 MED ORDER — IOPAMIDOL (ISOVUE-M 200) INJECTION 41%
18.0000 mL | Freq: Once | INTRAMUSCULAR | Status: AC
  Administered 2021-03-31: 18 mL via INTRATHECAL

## 2021-03-31 MED ORDER — MEPERIDINE HCL 50 MG/ML IJ SOLN
50.0000 mg | Freq: Once | INTRAMUSCULAR | Status: AC | PRN
  Administered 2021-03-31: 50 mg via INTRAMUSCULAR

## 2021-03-31 MED ORDER — DIAZEPAM 5 MG PO TABS
10.0000 mg | ORAL_TABLET | Freq: Once | ORAL | Status: AC
  Administered 2021-03-31: 10 mg via ORAL

## 2021-03-31 MED ORDER — ONDANSETRON HCL 4 MG/2ML IJ SOLN
4.0000 mg | Freq: Once | INTRAMUSCULAR | Status: AC | PRN
  Administered 2021-03-31: 4 mg via INTRAMUSCULAR

## 2021-03-31 NOTE — Discharge Instr - Other Orders (Addendum)
1313: 8/10 pain reported prior to myelogram procedure, Dr. Jeralyn Ruths ordered to give medications. see MAR.  1412: pt reports pain is "much better" ranking pain 2/10. Pt in nursing recovery area, lying flat. No complaints at this time.

## 2021-03-31 NOTE — Discharge Instructions (Signed)

## 2021-04-01 DIAGNOSIS — Z79899 Other long term (current) drug therapy: Secondary | ICD-10-CM | POA: Diagnosis not present

## 2021-04-22 ENCOUNTER — Encounter: Payer: Self-pay | Admitting: Student

## 2021-04-22 ENCOUNTER — Ambulatory Visit (INDEPENDENT_AMBULATORY_CARE_PROVIDER_SITE_OTHER): Payer: 59 | Admitting: Student

## 2021-04-22 ENCOUNTER — Ambulatory Visit (INDEPENDENT_AMBULATORY_CARE_PROVIDER_SITE_OTHER): Payer: Medicaid Other

## 2021-04-22 ENCOUNTER — Other Ambulatory Visit: Payer: Self-pay

## 2021-04-22 VITALS — BP 127/70 | HR 96 | Ht 68.0 in | Wt 253.4 lb

## 2021-04-22 DIAGNOSIS — R1032 Left lower quadrant pain: Secondary | ICD-10-CM | POA: Diagnosis not present

## 2021-04-22 DIAGNOSIS — E119 Type 2 diabetes mellitus without complications: Secondary | ICD-10-CM

## 2021-04-22 DIAGNOSIS — Z23 Encounter for immunization: Secondary | ICD-10-CM | POA: Diagnosis present

## 2021-04-22 LAB — POCT GLYCOSYLATED HEMOGLOBIN (HGB A1C): HbA1c, POC (controlled diabetic range): 6.2 % (ref 0.0–7.0)

## 2021-04-22 NOTE — Assessment & Plan Note (Signed)
A1c of 6.2 -Continue metformin 1000 mg twice daily
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
## 2021-04-22 NOTE — Patient Instructions (Signed)
It was great to see you! Thank you for allowing me to participate in your care!   I recommend that you always bring your medications to each appointment as this makes it easy to ensure we are on the correct medications and helps [place] not miss when refills are needed.  Our plans for today:  - Your A1c was great at 6.2, continue taking your medications recommended  - Please follow up with Dr. Arnoldo Morale for your back pain - We will check your urine today because of your abdominal pain, if you continue to have abdominal pain please let me know and I can put in an order for a pelvic ultrasound. Please look for any bleeding vaginally or pain while urinating or bleeding in the urine.   We are checking some labs today, I will call you if they are abnormal will send you a MyChart message or a letter if they are normal.  If you do not hear about your labs in the next 2 weeks please let us know.  Take care and seek immediate care sooner if you develop any concerns. Please remember to show up 15 minutes before your scheduled appointment time!  Gerrit Heck, MD [place]
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
## 2021-04-23 LAB — URINALYSIS
Bilirubin, UA: NEGATIVE
Glucose, UA: NEGATIVE
Ketones, UA: NEGATIVE
Nitrite, UA: NEGATIVE
RBC, UA: NEGATIVE
Specific Gravity, UA: >=1.03 — AB (ref 1.005–1.030)
Urobilinogen, Ur: 0.2 mg/dL (ref 0.2–1.0)
pH, UA: 5.5 (ref 5.0–7.5)

## 2021-04-27 ENCOUNTER — Encounter: Payer: Self-pay | Admitting: Student

## 2021-08-11 ENCOUNTER — Ambulatory Visit: Payer: 59 | Admitting: Student

## 2021-08-27 ENCOUNTER — Ambulatory Visit: Payer: 59 | Admitting: Family Medicine

## 2021-08-31 ENCOUNTER — Ambulatory Visit: Payer: 59 | Admitting: Student

## 2021-09-22 ENCOUNTER — Encounter: Payer: Self-pay | Admitting: *Deleted

## 2021-10-15 ENCOUNTER — Other Ambulatory Visit (HOSPITAL_COMMUNITY)
Admission: RE | Admit: 2021-10-15 | Discharge: 2021-10-15 | Disposition: A | Discharge status: Home / Self Care | Payer: 59 | Source: Ambulatory Visit | Attending: Family Medicine | Admitting: Family Medicine

## 2021-10-15 ENCOUNTER — Ambulatory Visit (INDEPENDENT_AMBULATORY_CARE_PROVIDER_SITE_OTHER): Payer: 59 | Admitting: Student

## 2021-10-15 ENCOUNTER — Encounter: Payer: Self-pay | Admitting: Student

## 2021-10-15 VITALS — BP 152/80 | HR 74 | Ht 68.0 in | Wt 252.0 lb

## 2021-10-15 DIAGNOSIS — E119 Type 2 diabetes mellitus without complications: Secondary | ICD-10-CM

## 2021-10-15 DIAGNOSIS — R1032 Left lower quadrant pain: Secondary | ICD-10-CM

## 2021-10-15 LAB — POCT GLYCOSYLATED HEMOGLOBIN (HGB A1C): HbA1c, POC (controlled diabetic range): 6.2 % (ref 0.0–7.0)

## 2021-10-15 LAB — POCT WET PREP (WET MOUNT)
Clue Cells Wet Prep Whiff POC: NEGATIVE
Trichomonas Wet Prep HPF POC: ABSENT

## 2021-10-15 LAB — POCT URINALYSIS DIP (MANUAL ENTRY)
Bilirubin, UA: NEGATIVE
Blood, UA: NEGATIVE
Glucose, UA: NEGATIVE mg/dL
Ketones, POC UA: NEGATIVE mg/dL
Leukocytes, UA: NEGATIVE
Nitrite, UA: NEGATIVE
Protein Ur, POC: NEGATIVE mg/dL
Spec Grav, UA: 1.015 (ref 1.010–1.025)
Urobilinogen, UA: 1 E.U./dL
pH, UA: 7 (ref 5.0–8.0)

## 2021-10-15 NOTE — Patient Instructions (Addendum)
It was great seeing you today.  We collected STI testing today- I will call you if this is abnormal.  We also scheduled a pelvic ultrasound. Please drink 32 oz of water before your appointment and do not urinate- you need a full bladder for this ultrasound.  If you develop severe or worsening abdominal pain plus fevers, changes in your bowel movement (blood in stool, very thin stools) than please be seen urgently.   If you have any questions or concerns, please feel free to call the clinic.    Be well,  Dr. Orvis Brill [facility] [phone]

## 2021-10-15 NOTE — Assessment & Plan Note (Addendum)
DDX includes diverticulosis, constipation, fibroids, endometrial mass, ovarian cysts. Obtained STI G/C testing and UA today to r/o STI or UA as cause of pain but no fever, chills, N/V/D Also ordered/schedueld a complete pelvic ultrasound w/ transvaginal ultrasound Return precautions discussed.

## 2021-10-15 NOTE — Progress Notes (Signed)
    SUBJECTIVE:   CHIEF COMPLAINT / HPI:   LLQ abdominal pain Started a few months ago Intermittent; comes and goes. Feels sharp and dull Denies change in stools- no bloody BM, thin caliber of stool, diarrhea or constipation Says she feels like it is her ovaries causing her pain Denies any vaginal bleeding, odor, change in discharge Would like STI testing today  Last seen in 04/22/21 here at [facility] clinic for same complaint Has hx endometrial mass w/ negative biopsy for malignancy  PERTINENT  PMH / PSH: Reviewed  OBJECTIVE:   BP (!) 152/80   Pulse 74   Ht '5\' 8"'$  (1.727 m)   Wt 252 lb (114.3 kg)   SpO2 93%   BMI 38.32 kg/m   General: Well-apeparing, NAD CV:RRR Abdomen: Soft, NTND, no palpable masses. Normal bowel sounds. GU: Chaperone Christen Bame, CMA present.  External vulva and vagina nonerythematous, without any obvious lesions or rash. Yellowish tinted discharge appreciated.  Normal ruggae of vaginal walls.  Cervix is non erythematous and non-friable.  There is no cervical motion tenderness, masses or gross abnormalities appreciated.  ASSESSMENT/PLAN:   Abdominal pain, left lower quadrant DDX includes diverticulosis, constipation, fibroids, endometrial mass, ovarian cysts. Obtained STI G/C testing and UA today to r/o STI or UA as cause of pain but no fever, chills, N/V/D Also ordered/schedueld a complete pelvic ultrasound w/ transvaginal ultrasound Return precautions discussed.    Orvis Brill, [place]

## 2021-10-16 LAB — CERVICOVAGINAL ANCILLARY ONLY
Chlamydia: NEGATIVE
Comment: NEGATIVE
Comment: NORMAL
Neisseria Gonorrhea: NEGATIVE

## 2021-10-21 ENCOUNTER — Other Ambulatory Visit (HOSPITAL_COMMUNITY): Payer: Self-pay

## 2021-10-21 ENCOUNTER — Ambulatory Visit (HOSPITAL_COMMUNITY): Payer: Medicaid Other

## 2021-10-21 ENCOUNTER — Telehealth: Payer: Self-pay

## 2021-10-21 NOTE — Telephone Encounter (Signed)
Patient calls nurse line regarding results from 10/15/21. Patient states that she is unable to log in to mychart. Provided with results per note from Dr. Owens Shark.   Patient has no questions at this time.   Talbot Grumbling, RN

## 2021-10-22 ENCOUNTER — Ambulatory Visit (HOSPITAL_COMMUNITY)
Admission: RE | Admit: 2021-10-22 | Discharge: 2021-10-22 | Disposition: A | Discharge status: Home / Self Care | Payer: Medicare Other | Source: Ambulatory Visit | Attending: Family Medicine | Admitting: Family Medicine

## 2021-10-22 DIAGNOSIS — R1032 Left lower quadrant pain: Secondary | ICD-10-CM | POA: Diagnosis present

## 2021-10-23 ENCOUNTER — Telehealth: Payer: Self-pay | Admitting: Student

## 2021-10-23 ENCOUNTER — Telehealth: Payer: Self-pay

## 2021-10-23 DIAGNOSIS — D259 Leiomyoma of uterus, unspecified: Secondary | ICD-10-CM

## 2021-10-23 NOTE — Telephone Encounter (Signed)
Received call report from [place] at [facility]. Please see the following results from pelvic ultrasound.   IMPRESSION: Nonvisualization of RIGHT ovary.   6.2 cm diameter solid mass in LEFT adnexa, likely LEFT ovarian origin, with no normal appearing LEFT ovary identified.   Thickened, heterogeneous endometrial complex 28 mm thick containing internal blood flow, concerning for a discrete endometrial mass, potentially endometrial carcinoma or protrusion of a large submucosal leiomyoma into the endometrium.   Both of these findings can be assessed by follow-up MR imaging with and without contrast.  Please advise.   Talbot Grumbling, RN

## 2021-10-23 NOTE — Telephone Encounter (Signed)
Attempted to call patient to discuss pelvic US results x2. Will try again later.  Also discussed with Dr. McDiarmid and ordered MRI pelvis w/ w/o contrast for further assessment of 6.2 cm solid mass in left adnexa and concern for endometrial mass  potentially endometrial carcinoma or protrusion of a large submucosal leiomyoma into the endometrium.  Orvis Brill, DO

## 2021-10-26 ENCOUNTER — Telehealth: Payer: Self-pay | Admitting: *Deleted

## 2021-10-26 NOTE — Telephone Encounter (Signed)
Patient returned call to nurse line and LVM on Friday afternoon regarding missed call to discuss results.   Please return call to patient at [phone].   Talbot Grumbling, RN

## 2021-10-26 NOTE — Telephone Encounter (Signed)
Tried to contact pt to inform her of her MRI appointment.  See below. Also placed a reminder to be mailed.  Norma [name] Katharina Caper, [place]    Date: 11/20/2021 Location: [facility] Time: 9:00am with a 8:30am arrival for    check-in.

## 2021-10-26 NOTE — Telephone Encounter (Signed)
Called and discussed pelvic US results with Norma [name]. Discussed need for further imaging with MRI pelvis, which was scheduled at [facility].  Orvis Brill, DO

## 2021-10-27 NOTE — Telephone Encounter (Signed)
Patient scheduled for 11-20-21

## 2021-11-20 ENCOUNTER — Ambulatory Visit (HOSPITAL_COMMUNITY)
Admission: RE | Admit: 2021-11-20 | Discharge: 2021-11-20 | Disposition: A | Discharge status: Home / Self Care | Payer: Medicare Other | Source: Ambulatory Visit | Attending: Family Medicine | Admitting: Family Medicine

## 2021-11-20 DIAGNOSIS — D259 Leiomyoma of uterus, unspecified: Secondary | ICD-10-CM

## 2021-11-20 MED ORDER — GADOBUTROL 1 MMOL/ML IV SOLN
10.0000 mL | Freq: Once | INTRAVENOUS | Status: AC | PRN
  Administered 2021-11-20: 10 mL via INTRAVENOUS

## 2021-11-23 ENCOUNTER — Telehealth: Payer: Self-pay

## 2021-11-23 NOTE — Telephone Encounter (Signed)
Patient calls nurse line requesting results of MRI.   Test performed on 8/4.  Will forward to ordering provider.

## 2021-11-26 NOTE — Telephone Encounter (Signed)
Patient LVM on nurse line requesting return phone call from provider to discuss results of MRI.   Please return call to patient at [phone].  Talbot Grumbling, RN

## 2021-11-27 ENCOUNTER — Ambulatory Visit (INDEPENDENT_AMBULATORY_CARE_PROVIDER_SITE_OTHER): Payer: Medicare Other | Admitting: Family Medicine

## 2021-11-27 ENCOUNTER — Telehealth: Payer: Self-pay | Admitting: Student

## 2021-11-27 VITALS — BP 100/75 | HR 94 | Wt 243.4 lb

## 2021-11-27 DIAGNOSIS — R1084 Generalized abdominal pain: Secondary | ICD-10-CM

## 2021-11-27 DIAGNOSIS — R252 Cramp and spasm: Secondary | ICD-10-CM

## 2021-11-27 MED ORDER — ATORVASTATIN CALCIUM 10 MG PO TABS: 10.0000 mg | ORAL_TABLET | Freq: Every day | ORAL | 3 refills | Status: DC

## 2021-11-27 NOTE — Telephone Encounter (Signed)
Spoke to patient via telephone call on behalf of Dr. Owens Shark yesterday 11/26/21 to discuss MRI results.  Patient also mentioned concern for cramps in her legs but also throughout her body that have been present more at night but also during the day.  She has been trying to use mustard and bananas to help with the cramps without any significant relief.  Unsure of cause, but would likely need to check electrolytes so scheduled for access to care today 11/27/2021.  Instructed patient to continue with trying to have a varied diet as well as possibly Gatorade/Pedialyte to help with her electrolytes.  Possibly related to diabetes?  Although, controlled relatively well.  Will route to ATC physician this AM.

## 2021-11-27 NOTE — Assessment & Plan Note (Signed)
-  concern for statin intolerance, decreased atorvastatin to 10 mg daily -pending CMP: electrolytes to look at potassium and calcium levels  -other differentials include electrolyte derangements including hypocalcemia and fibromyalgia -follow up in 2-3 weeks, consider repeat lipid panel

## 2021-11-27 NOTE — Patient Instructions (Addendum)
It was great seeing you today!  Today we discussed your cramping, I think this may be due to your statin. Please take atorvastatin 10 mg daily instead of 20 mg. We will also get labs, I will let you know of any abnormal results. This will allow [place] to check your potassium, sodium and calcium.   Please follow up at your next scheduled appointment in 2-3 weeks, if anything arises between now and then, please don't hesitate to contact our office.   Thank you for allowing [place] to be a part of your medical care!  Thank you, Dr. Larae Grooms

## 2021-11-27 NOTE — Progress Notes (Signed)
    SUBJECTIVE:   CHIEF COMPLAINT / HPI:   Patient presents with generalized cramping, more prominently legs, arms, feet and back. She feels like this sensation moves around. She takes flexeril for muscle spasms which does not seem to help her symptoms. This has been ongoing for a year but has become worse over the past few months. When she drives, her leg cramps are worse. She always feels sleepy and tired. Takes atorvastatin and has been on it for a year, but says that the cramping occurs all the time. She has tried mustard, pickle juice and bananas but nothing helps the pain completely resolve although it helps it feel a little better for a brief period of time. Denies any other associated symptoms. 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 She feels like she is aching all over most days.   OBJECTIVE:   BP 100/75   Pulse 94   Wt 243 lb 6.4 oz (110.4 kg)   SpO2 98%   BMI 37.01 kg/m   General: Patient well-appearing, in no acute distress. CV: RRR, no wheezing, rales or rhonchi noted Resp: CTAB, no wheezing, rales or rhonchi noted Neuro: 5/5 UE and LE strength bilaterally MSK: no LE edema or calf tenderness noted bilaterally, no point tenderness along shoulders or other areas bilaterally  Psych: mood appropriate   ASSESSMENT/PLAN:   Generalized abdominal cramping -concern for statin intolerance, decreased atorvastatin to 10 mg daily -pending CMP: electrolytes to look at potassium and calcium levels  -other differentials include electrolyte derangements including hypocalcemia and fibromyalgia -follow up in 2-3 weeks, consider repeat lipid panel      Donney Dice, [place]

## 2021-11-28 LAB — COMPREHENSIVE METABOLIC PANEL
ALT: 15 IU/L (ref 0–32)
AST: 25 IU/L (ref 0–40)
Albumin/Globulin Ratio: 1.4 (ref 1.2–2.2)
Albumin: 4.3 g/dL (ref 3.9–4.9)
Alkaline Phosphatase: 74 IU/L (ref 44–121)
BUN/Creatinine Ratio: 17 (ref 12–28)
BUN: 16 mg/dL (ref 8–27)
Bilirubin Total: 0.2 mg/dL (ref 0.0–1.2)
CO2: 22 mmol/L (ref 20–29)
Calcium: 9.2 mg/dL (ref 8.7–10.3)
Chloride: 100 mmol/L (ref 96–106)
Creatinine, Ser: 0.94 mg/dL (ref 0.57–1.00)
Globulin, Total: 3.1 g/dL (ref 1.5–4.5)
Glucose: 106 mg/dL — ABNORMAL HIGH (ref 70–99)
Potassium: 3.9 mmol/L (ref 3.5–5.2)
Sodium: 138 mmol/L (ref 134–144)
Total Protein: 7.4 g/dL (ref 6.0–8.5)
eGFR: 69 mL/min/{1.73_m2} (ref >59)

## 2021-12-15 ENCOUNTER — Ambulatory Visit (INDEPENDENT_AMBULATORY_CARE_PROVIDER_SITE_OTHER): Payer: Medicare Other | Admitting: Student

## 2021-12-15 VITALS — BP 142/79 | HR 97 | Wt 240.0 lb

## 2021-12-15 DIAGNOSIS — M545 Low back pain, unspecified: Secondary | ICD-10-CM | POA: Diagnosis not present

## 2021-12-15 MED ORDER — BACLOFEN 10 MG PO TABS: 10.0000 mg | ORAL_TABLET | Freq: Three times a day (TID) | ORAL | 0 refills | Status: DC

## 2021-12-15 MED ORDER — LIDOCAINE 5 % EX PTCH: 1.0000 | MEDICATED_PATCH | CUTANEOUS | 0 refills | Status: DC

## 2021-12-15 NOTE — Patient Instructions (Addendum)
It was wonderful to see you today. Thank you for allowing me to be a part of your care. Below is a short summary of what we discussed at your visit today:  Your back pain is likely due to muscle sprain.  I have switched you from Flexeril to baclofen 10 mg 3 times daily due to sleepiness with Flexeril.  Also placed an order for lidocaine patch which you should apply on the area where you feel pain.  Your last kidney test was normal.  You should start your gabapentin as prescribed 300 mg 3 times daily.  Follow-up in 3 weeks.   Please remember to followup with us/your GYN for your pap smear   Schedule your colonoscopy to help detect colon cancer. The stomach doctors will call to schedule an appt with you. If you decide against this, please ask about the cologuard as an option.     Please bring all of your medications to every appointment!  If you have any questions or concerns, please do not hesitate to contact us via phone or MyChart message.   Alen Bleacher, MD [facility]

## 2021-12-15 NOTE — Progress Notes (Signed)
    SUBJECTIVE:   CHIEF COMPLAINT / HPI:   Patient report left lower back pain that started on the LLQ of the abdomen and radiate to the back that started 3 days ago. Pain started after lifting her 63 year old grandson. Pain is worse with positional changes. Her Percocet provide some receive. She has history of sciatica which is different from this pain and her sciatica improves when she takes her Gabapentin. Reports noncompliance to her gabapentin due to concerns of her kidney function. No fever or chills Denies any vaginal irritation or pain with voiding.   Negative for urinary urgency or frequency No incontinence and saddle paresthesia   PERTINENT  PMH / PSH: Spinal stenosis with neurogenic claudication  OBJECTIVE:   BP (!) 142/79   Pulse 97   Wt 240 lb (108.9 kg)   SpO2 96%   BMI 36.49 kg/m    Physical Exam General: Alert, well appearing, NAD Cardiovascular: Well perfused Respiratory: Normal work of breathing on room air Abdomen: No distension or tenderness Musculoskeletal: Lower back tenderness on palpation. Extremities:5/5 muscle strength in all extremities with sensation intact  ASSESSMENT/PLAN:   Lower back pain Patient complain of lower back pain that started after picking up her grandson. No red flag signs. Her history and physical exam is concerning for muscle sprain. -Recommend use of lidocaine patch on the affected area -Switched her Flexeril to baclofen 10 mg 3 times daily (d/t sleepiness w/ flexeril) -Advised patient to take her gabapentin 300 mg 3 times daily as prescribed.   Norma Bleacher, MD [place]

## 2021-12-30 ENCOUNTER — Telehealth: Payer: Self-pay

## 2021-12-30 NOTE — Telephone Encounter (Signed)
Patient calls nurse line regarding intermittent leg cramps. She reports that pain is worse when she is lying down at nighttime.   Medications have not helped with cramps. She is asking for further recommendations to help with cramps.   She has follow up appointment on 01/19/22.   *Patient is also requesting change in PCP. Patient states that she feels more comfortable with female provider.   Before I could provide patient with process for changing PCP, she had to get off the phone. She will call back to discuss further.   Please advise additional recommendations for muscle cramps.   Talbot Grumbling, RN

## 2021-12-31 ENCOUNTER — Other Ambulatory Visit: Payer: Self-pay | Admitting: Student

## 2021-12-31 NOTE — Progress Notes (Signed)
Following up call patient had with the nurse about her still having Cramps. Unable to reach patient and left a generic VM.

## 2022-01-19 ENCOUNTER — Ambulatory Visit: Payer: Medicare Other | Admitting: Student

## 2022-02-17 ENCOUNTER — Other Ambulatory Visit (HOSPITAL_COMMUNITY): Payer: Self-pay

## 2022-02-17 ENCOUNTER — Other Ambulatory Visit: Payer: Self-pay

## 2022-02-17 MED ORDER — OZEMPIC (1 MG/DOSE) 4 MG/3ML ~~LOC~~ SOPN
1.0000 mg | PEN_INJECTOR | SUBCUTANEOUS | 1 refills | Status: DC
  Filled 2022-02-17 (×2): qty 9, 84d supply, fill #0

## 2022-02-19 ENCOUNTER — Other Ambulatory Visit (HOSPITAL_COMMUNITY): Payer: Self-pay

## 2022-02-19 MED ORDER — GABAPENTIN 800 MG PO TABS
800.0000 mg | ORAL_TABLET | Freq: Four times a day (QID) | ORAL | 0 refills | Status: AC
  Filled 2022-02-19: qty 120, 30d supply, fill #0

## 2022-03-01 ENCOUNTER — Other Ambulatory Visit (HOSPITAL_COMMUNITY): Payer: Self-pay

## 2022-03-16 ENCOUNTER — Other Ambulatory Visit: Payer: Self-pay | Admitting: Student

## 2022-03-16 DIAGNOSIS — M545 Low back pain, unspecified: Secondary | ICD-10-CM

## 2022-03-30 ENCOUNTER — Encounter: Payer: Self-pay | Admitting: Student

## 2022-03-30 ENCOUNTER — Ambulatory Visit (INDEPENDENT_AMBULATORY_CARE_PROVIDER_SITE_OTHER): Payer: Medicare Other | Admitting: Student

## 2022-03-30 VITALS — BP 118/64 | HR 119 | Wt 230.0 lb

## 2022-03-30 DIAGNOSIS — E66812 Obesity, class 2: Secondary | ICD-10-CM

## 2022-03-30 DIAGNOSIS — Z6839 Body mass index (BMI) 39.0-39.9, adult: Secondary | ICD-10-CM | POA: Diagnosis not present

## 2022-03-30 DIAGNOSIS — E669 Obesity, unspecified: Secondary | ICD-10-CM | POA: Diagnosis not present

## 2022-03-30 DIAGNOSIS — Z1231 Encounter for screening mammogram for malignant neoplasm of breast: Secondary | ICD-10-CM

## 2022-03-30 DIAGNOSIS — R0602 Shortness of breath: Secondary | ICD-10-CM | POA: Diagnosis not present

## 2022-03-30 DIAGNOSIS — Z77120 Contact with and (suspected) exposure to mold (toxic): Secondary | ICD-10-CM | POA: Diagnosis not present

## 2022-03-30 NOTE — Assessment & Plan Note (Addendum)
Patient's weight today was 230lbs with BMI of 34.97.  She has lost over 20 pounds since starting Ozempic 5 months ago with good tolerance to Ozempic.  Had a long discussion with patient about risk and benefits of laser fat removal surgery includes chance of weight returning if no changes to life style and also the possibility that insurance might not cover such cosmetic procedure.  Also given her progress on Ozempic encourage patient to continue her current plan.  -Discussed dieting which includes Mediterranean diet's, low-carb diet, high-protein and avoiding soda or high carb diets. -Encourage patient to continue on her Ozempic and could consider going up her weekly dose. -Advised increased activity and exercising.

## 2022-03-30 NOTE — Patient Instructions (Addendum)
It was wonderful to meet you today. Thank you for allowing me to be a part of your care. Below is a short summary of what we discussed at your visit today:  Your weight today is 2 daily and you have lost about 22 pounds since June which coincided with the time you have started your Ozempic.  Recommend continued use of your Ozempic, exercise and use of Mediterranean diet which you can find online.  Also provided you a letter in support of getting a healthier living environment.  I have placed a new order for your mammogram,  you can call the breast center to schedule an appointment with them.  Please bring all of your medications to every appointment!  If you have any questions or concerns, please do not hesitate to contact us via phone or MyChart message.   Alen Bleacher, MD [facility]

## 2022-03-30 NOTE — Progress Notes (Signed)
    SUBJECTIVE:   CHIEF COMPLAINT / HPI:   Patient is a 61 year old female presenting today to discuss laser fat surgery. Patient has history of T2DM and morbidly obese. Weight today is 230lbs and BMI is 34.97. She is currently on Ozempic 1 mg weekly and endorses compliance with good tolerance Patient report she's been watching what she is eating and describes herself ss overall active.  Informed by her pain management provider  that she might have fatty liver and laser fat surgery could potentially prevent progression of diseases Chart review patient's most recent CMP showed normal liver enzyme levels MRI and pelvic ultrasound did not  Pound labs since 2022.  Mold in House  Patient report having mold in apartment and has been talking to [place] and in court for this issue Patient report she's developed cough and worsening dyspnea. Grand kids spend time in the house and she's concern about the effect on the kids Patient provided pictures today and would like to her a letter for the city and court.     PERTINENT  PMH / PSH: Reviewed   OBJECTIVE:   BP 118/64   Pulse (!) 119   Wt 230 lb (104.3 kg)   SpO2 98%   BMI 34.97 kg/m    Physical Exam General: Alert, well appearing, NAD, Oriented x4 Cardiovascular: RRR, No Murmurs, Normal S2/S2 Respiratory: CTAB, No wheezing or Rales Abdomen: No distension or tenderness Extremities: No edema on extremities   Skin: Warm and dry  ASSESSMENT/PLAN:   Obesity Patient's weight today was 230lbs with BMI of 34.97.  She has lost over 20 pounds since starting Ozempic 5 months ago with good tolerance to Ozempic.  Had a long discussion with patient about risk and benefits of laser fat removal surgery includes chance of weight returning if no changes to life style and also the possibility that insurance might not cover such cosmetic procedure.  Also given her progress on Ozempic encourage patient to continue her current plan.  -Discussed dieting  which includes Mediterranean diet's, low-carb diet, high-protein and avoiding soda or high carb diets. -Encourage patient to continue on her Ozempic and could consider going up her weekly dose. -Advised increased activity and exercising.  Mold in housing Patient presented with pictures of mold for what appears to be the ceiling of her home.  She reports having cough and intermittent shortness of breath. -Provided patient with letter stating she would benefit from better living condition.   HCM Patient is due for annual foot exam, microalbuminuria, Pap smear and colonoscopy. -Patient reports she is up-to-date on her colonoscopy which she reports was completed a year ago and was normal -Patient deferred rest of her test to next visit    Alen Bleacher, MD [place]

## 2022-04-05 ENCOUNTER — Ambulatory Visit (INDEPENDENT_AMBULATORY_CARE_PROVIDER_SITE_OTHER): Payer: Medicare Other | Admitting: Student

## 2022-04-05 VITALS — BP 123/74 | HR 97 | Ht 68.0 in | Wt 229.0 lb

## 2022-04-05 DIAGNOSIS — B349 Viral infection, unspecified: Secondary | ICD-10-CM

## 2022-04-05 NOTE — Progress Notes (Addendum)
    SUBJECTIVE:   CHIEF COMPLAINT / HPI:   Throat Pain Patient with a history of GERD coming in with pain in her throat for the past 2 or 3 days.  The pain will occasionally tracked down into her chest but always seems to start in her throat.  It is not associated with anything in particular.  She has also been bothered by an increased amount of oropharyngeal secretions, feels like she is constantly having to clear her throat and swallow these excess secretions.  She has otherwise been feeling well.  No shortness of breath, no fevers.  Her son is sick at home with some sort of virus.  She also notes that she has some mold in her home but the city is trying to help her with.  She has been taking her daily Prilosec but feels that the symptoms are different from her typical GERD symptoms.  OBJECTIVE:   BP 123/74   Pulse 97   Ht '5\' 8"'$  (1.727 m)   Wt 229 lb (103.9 kg)   SpO2 100%   BMI 34.82 kg/m   Gen: Well-appearing and engaged in interview/exam Eyes: EOMs intact, without conjunctival injection Nose: Without rhinorrhea or congestion Mouth: Oropharynx erythematous without exudate or gross tonsillar hypertrophy, uvula midline Neck: Single ~1 cm lymph node on left, nontender Cardio: Regular rate and rhythm, II/VI systolic murmur as documented previously (Dr. Merrilee Jansky note 2014) Abd: Non-distended  ASSESSMENT/PLAN:   Viral syndrome Symptoms are consistent with acute viral syndrome.  Evidence of infection given oropharyngeal erythema and cervical lymphadenopathy.  Differential also includes reflux symptoms related to her GERD.  She is going to continue her Prilosec.  Also suggested that she may try taking a dose of Tums or Maalox when symptoms recur.  Will test for COVID/flu/RSV per patient request.  Reviewed ER precautions for red flag chest pain symptoms.  Also advised that she may use Mucinex to help her clear the secretions that have been so bothersome to her.  Anticipate that this will  have a self resolving course.     Pearla Dubonnet, MD [place]

## 2022-04-05 NOTE — Assessment & Plan Note (Signed)
Symptoms are consistent with acute viral syndrome.  Evidence of infection given oropharyngeal erythema and cervical lymphadenopathy.  Differential also includes reflux symptoms related to her GERD.  She is going to continue her Prilosec.  Also suggested that she may try taking a dose of Tums or Maalox when symptoms recur.  Will test for COVID/flu/RSV per patient request.  Reviewed ER precautions for red flag chest pain symptoms.  Also advised that she may use Mucinex to help her clear the secretions that have been so bothersome to her.  Anticipate that this will have a self resolving course.

## 2022-04-05 NOTE — Patient Instructions (Addendum)
Ms. Kees,  It sounds like you've got a virus, we are testing you for COVID/flu/RSV. You can take some over the counter mucinex to help break up the secretions you are having. I expect this will get better with some time.  Continue taking your omeprazole. If you develop squeezing/crushing chest pain, please go to the ER. If you start having new fevers or coughing up green/yellow gunk, come back to see [place].   Pearla Dubonnet, MD

## 2022-04-07 LAB — COVID-19, FLU A+B AND RSV
Influenza A, NAA: NOT DETECTED
Influenza B, NAA: NOT DETECTED
RSV, NAA: NOT DETECTED
SARS-CoV-2, NAA: NOT DETECTED

## 2022-05-26 ENCOUNTER — Ambulatory Visit
Admission: RE | Admit: 2022-05-26 | Discharge: 2022-05-26 | Disposition: A | Discharge status: Home / Self Care | Payer: 59 | Source: Ambulatory Visit | Attending: Family Medicine | Admitting: Family Medicine

## 2022-05-26 DIAGNOSIS — Z1231 Encounter for screening mammogram for malignant neoplasm of breast: Secondary | ICD-10-CM

## 2022-05-27 ENCOUNTER — Telehealth: Payer: Self-pay | Admitting: Student

## 2022-05-27 NOTE — Telephone Encounter (Signed)
Left message for patient to call back and schedule Medicare Annual Wellness Visit (AWV).   Please offer to do virtually or by telephone.  Left office number and my jabber [phone].  Last AWV:01/02/2021   Please schedule at anytime with Nurse Health Advisor.

## 2022-06-22 ENCOUNTER — Ambulatory Visit: Payer: 59 | Admitting: Student

## 2022-06-28 ENCOUNTER — Telehealth: Payer: Self-pay

## 2022-06-28 ENCOUNTER — Other Ambulatory Visit: Payer: Self-pay

## 2022-06-28 ENCOUNTER — Ambulatory Visit (INDEPENDENT_AMBULATORY_CARE_PROVIDER_SITE_OTHER): Payer: 59 | Admitting: Family Medicine

## 2022-06-28 ENCOUNTER — Encounter: Payer: Self-pay | Admitting: Family Medicine

## 2022-06-28 VITALS — BP 137/76 | HR 100 | Wt 233.6 lb

## 2022-06-28 DIAGNOSIS — E119 Type 2 diabetes mellitus without complications: Secondary | ICD-10-CM | POA: Diagnosis not present

## 2022-06-28 DIAGNOSIS — Z6835 Body mass index (BMI) 35.0-35.9, adult: Secondary | ICD-10-CM

## 2022-06-28 DIAGNOSIS — E669 Obesity, unspecified: Secondary | ICD-10-CM | POA: Diagnosis not present

## 2022-06-28 LAB — POCT GLYCOSYLATED HEMOGLOBIN (HGB A1C): HbA1c, POC (controlled diabetic range): 6 % (ref 0.0–7.0)

## 2022-06-28 MED ORDER — OZEMPIC (1 MG/DOSE) 4 MG/3ML ~~LOC~~ SOPN: 2.0000 mg | PEN_INJECTOR | SUBCUTANEOUS | 0 refills | Status: DC

## 2022-06-28 NOTE — Telephone Encounter (Signed)
Received phone call from pharmacy regarding Ozempic prescription. Pharmacist reports that if patient is going to be injecting 2 mg weekly, new prescription will need to be sent for increased dosage ('8mg'$ /83m)  Please [facility] prescription to [facility]   HTalbot Grumbling RN

## 2022-06-28 NOTE — Patient Instructions (Signed)
It was wonderful to see you today.  Please bring ALL of your medications with you to every visit.   Today we talked about:  Weight loss - I have referred you to both our dietician and the [facility]. They will give you a call.  Please call our dietician call Dr. Jenne Campus for all initial appts: [phone].   Please follow up in 2 months   Thank you for choosing [facility].   Please call [phone] with any questions about today's appointment.  Please be sure to schedule follow up at the front desk before you leave today.   Lowry Ram, MD  Family Medicine

## 2022-06-28 NOTE — Progress Notes (Deleted)
.  fmcn

## 2022-06-28 NOTE — Progress Notes (Unsigned)
    SUBJECTIVE:   CHIEF COMPLAINT / HPI:   Fat Loss  Patient says that her pain doctor told her she had fatty liver disease and that losing weight could be beneficial. She is here today to discuss laser fat removal as a method of weight loss to improve fatty liver disease. No side effects with ozempic. Says she has been on 1 mg for a year. She says she would like to increase. Denies side effects from metformin. Patient says that she likes to exercise outside by going on walks and that eats a lot of fruit. She has difficulty eating three meals a day, mostly cooks all of her meals   PERTINENT  PMH / PSH: HTN, GERD, T2DM  OBJECTIVE:   BP 137/76   Pulse 100   Wt 233 lb 9.6 oz (106 kg)   SpO2 100%   BMI 35.52 kg/m   General: well appearing, in no acute distress CV: RRR, radial pulses equal and palpable Resp: Normal work of breathing on room air Abd: Soft, non tender, non distended  Neuro: Alert & Oriented x 4    ASSESSMENT/PLAN:   Type 2 diabetes mellitus (HCC) A1c today is improved 6.0. Diabetes is well controlled with ozempic and BID metformin 1000mg .  - Continue metformin  - Will increase ozempic for weight loss view "Obesity" problem  Obesity, Class II, BMI 35-39.9 Patient and I had a long conversation regarding laser fat surgery and how this would not significant affect weight and is more of a cosmetic procedure. Patient had previous imaging that showed possible hepatic steatosis but no significant fatty liver disease as of yet. Her last LFTs 7 months ago were within normal limits. Thus, we focused on other interventions to help with long term weight loss.  - Counseled regarding lifestyle interventions  - Referred to RD for nutrition help  - Referred to [facility]  - Increase Ozempic to 2mg   - Continue BID metformin 1000 mg     Scheduled follow up in one month for CMP and follow up of weight loss.  Reassigned PCP due to gender preference.   Lowry Ram,  MD [place]

## 2022-06-28 NOTE — Assessment & Plan Note (Signed)
A1c today is improved 6.0. Diabetes is well controlled with ozempic and BID metformin '1000mg'$ .  - Continue metformin  - Will increase ozempic for weight loss view "Obesity" problem

## 2022-06-29 MED ORDER — SEMAGLUTIDE (2 MG/DOSE) 8 MG/3ML ~~LOC~~ SOPN: PEN_INJECTOR | SUBCUTANEOUS | 0 refills | Status: AC

## 2022-06-29 NOTE — Telephone Encounter (Signed)
Patient returns call to nurse line regarding Ozempic.   She expresses frustration as she was wanting to pick up medication yesterday.   Advised that I would send message to prescriber.   I have pended updated dosage to this request.   Talbot Grumbling, RN

## 2022-06-29 NOTE — Assessment & Plan Note (Signed)
Patient and I had a long conversation regarding laser fat surgery and how this would not significant affect weight and is more of a cosmetic procedure. Patient had previous imaging that showed possible hepatic steatosis but no significant fatty liver disease as of yet. Her last LFTs 7 months ago were within normal limits. Thus, we focused on other interventions to help with long term weight loss.  - Counseled regarding lifestyle interventions  - Referred to RD for nutrition help  - Referred to [facility]  - Increase Ozempic to '2mg'$   - Continue BID metformin 1000 mg

## 2022-07-06 NOTE — Addendum Note (Signed)
Addended by: Lowry Ram on: 07/06/2022 05:06 PM   Modules accepted: Orders

## 2022-07-08 ENCOUNTER — Ambulatory Visit: Payer: 59 | Admitting: Family Medicine

## 2022-07-08 NOTE — Progress Notes (Deleted)
Medical Nutrition Therapy Appt start time: 1100 end time: 1200 (1 hour) Primary concerns today: Weight management and Blood sugar control.   Relevant history/background: Norma [name] was referred by Lowry Ram, MD for MNT related to DM 2 and obesity.  Other diagnoses include HTN, depression, vit D deficiency, and anemia.  Started Ozempic 2022; increased dose to 2 mg on 06/28/22.  On 06/28/22 A1c was 6.0; wt was 233.6 lb; BMI was 35.52 (ht is 68".)   She was also referred to [facility] for exercise program.    Assessment:  ***  Learning Readiness: Ready Change in progress  ***  Weight: *** lb; (233.6 lb on 06/28/22; ht is 68") Usual eating pattern: *** meals and *** snacks per day. Frequent foods and beverages: ***.   Avoided foods: ***.   Usual physical activity: ***. Sleep: ***  24-hr recall: (Up at  AM) B ( AM)-    Snk ( AM)-    L ( PM)-   Snk ( PM)-   D ( PM)-   Snk ( PM)-   Typical day? {yes no:314532}   Nutritional Diagnosis:  {CHL AMB NUTRITIONAL [phone]}  Handouts given during visit include: After-Visit Summary (AVS) ***  Demonstrated degree of understanding via:  Teach Back  Barriers to learning/adherence to lifestyle change: ***  Monitoring/Evaluation:  Dietary intake, exercise, and body weight {follow up:15908}.

## 2022-07-22 ENCOUNTER — Ambulatory Visit (INDEPENDENT_AMBULATORY_CARE_PROVIDER_SITE_OTHER): Payer: 59 | Admitting: Family Medicine

## 2022-07-22 DIAGNOSIS — E669 Obesity, unspecified: Secondary | ICD-10-CM | POA: Diagnosis not present

## 2022-07-22 DIAGNOSIS — E119 Type 2 diabetes mellitus without complications: Secondary | ICD-10-CM

## 2022-07-22 NOTE — Patient Instructions (Addendum)
-   Schedule an appointment with Dr. Valentina Lucks to learn how to use your glucometer.  Be sure to bring your glucometer and supplies to that appt.   - Eat your fruit, don't drink it.   - Cereals: Look for one that has at least 5 grams of fiber per serving.   - To increase vegetables, try roasting vegetables.     Diet Recommendations for Diabetes  Carbohydrate includes starch, sugar, and fiber.  Of these, only sugar and starch raise blood glucose.  (Fiber is found in fruits, vegetables [especially skin, seeds, and stalks], whole grains, and beans.)   Starchy (carb) foods: Bread, rice, pasta, potatoes, corn, cereal, grits, crackers, bagels, muffins, all baked goods.  (Fruit, milk, and yogurt also have carbohydrate, but most of these foods will not spike your blood sugar as most starchy or sweet foods will.)  A few fruits do cause high blood sugars; use small portions of bananas (limit to 1/2 at a time), grapes, watermelon, and oranges.   Protein foods: Meat, fish, poultry, eggs, dairy foods, and beans such as pinto and kidney beans (beans also provide carbohydrate).   1. Eat at least 3 REAL meals and 1-2 snacks per day.  Eat breakfast within one hour of getting up.  Have something to eat at least every 5 hours while awake.  - A REAL breakfast needs to include both starch and protein foods.   - A REAL meal for lunch or dinner includes at least some protein, some starch, and vegetables and/or fruit.     2. Limit starchy foods to TWO portions per meal and ONE per snack. ONE portion of a starchy food is equal to the following:  - ONE slice of bread (or its equivalent, such as half of a hamburger bun).  - 1/2 cup of a "scoopable" starchy food such as potatoes or rice.  - 15 grams of Total Carbohydrate as shown on food label.  - 4 ounces of a sweet drink (including fruit juice).   - If you MUST have juice, try diluting it with seltzer or club soda.    3. Include twice the volume of vegetables as protein or  carbohydrate foods in at least 3 meals per week.  - Fresh or frozen vegetables are best.  - Keep frozen vegetables on hand for a quick option.     Follow-up appt on Thursday, April 25 at 11 AM.

## 2022-07-22 NOTE — Progress Notes (Signed)
Medical Nutrition Therapy Appt start time: 1000 end time: 1100 (1 hour) PCP Lowry Ram, MD Primary concerns today: Weight management and Blood sugar control.   Relevant history/background: Ms. Barraclough was referred by Lowry Ram, MD for MNT related to DM2 and obesity.  Other diagnoses include HTN, depression, vit D deficiency, and anemia.  Started Ozempic 2022; increased dose to 2 mg on 06/28/22.  On 06/28/22 A1c was 6.0; wt was 233.6 lb; BMI was 35.52 (ht is 68".)   She was also referred to [facility] for exercise program.    Assessment:  Ms. Wixom wants to learn better food choices to manage both her BG and weight.  She again expressed concern that her her pain doctor brought up the possibility of metabolic dysfunction-associated steatotic liver disease (MASLD).  Recent images were reassuring, but I told her that dietary recommendations for managing blood glucose will also help to prevent MASLD.    Learning Readiness: Ready  Weight: 229.0 lb; (233.6 lb on 06/28/22; ht is 68") Usual eating pattern: 1-2 meals and 3-4 snacks per day. Frequent foods and beverages: water, cranberry juice; deli [place], chx, fish, fruit.   Avoided foods: none.   Usual physical activity: none currently.  Cares for grandchildren ages 44, 64, 61, & 84 Monday thru Sunday.   Sleep: Estimates she gets hrs/night. Fals asleep by 1 or 2 AM.  Sometimes craves sugar if wakes up at night, and eats something sweet (e.g,. cookie, granola bar) and falls back to sleep.    24-hr recall: (Up at 8:30 AM) B (9 AM)-   1 1/2 c Frosted Flakes, 1 c whole milk Snk ( AM)-   --- L ( PM)-  water Snk ( PM)-  --- D (5 PM)-  2 beef tacos w/ chs and let, 16 oz cranberry juice Snk ( PM)-  --- Typical day? Yes.   Total water 48 oz all day.  Usually has ~32 oz cranberry juice.  Eats vegetables ~3 times/wk.    Nutritional Diagnosis:  NB-1.1 Food and nutrition-related knowledge deficit As related to optimal nutrition while controlling BG.  As  evidenced by inadequate intake, especially of vegetables and protein foods.  Handouts given during visit include: After-Visit Summary (AVS)  Demonstrated degree of understanding via:  Teach Back  Barriers to learning/adherence to lifestyle change: Poor appetite secondary to GLP1 makes 3 meals a day more difficult.    Monitoring/Evaluation:  Dietary intake, exercise, and body weight in 3 week(s).

## 2022-08-03 LAB — LAB REPORT - SCANNED
A1c: 6.3
EGFR: 67

## 2022-08-05 ENCOUNTER — Ambulatory Visit (INDEPENDENT_AMBULATORY_CARE_PROVIDER_SITE_OTHER): Payer: 59 | Admitting: Pharmacist

## 2022-08-05 VITALS — Wt 234.0 lb

## 2022-08-05 DIAGNOSIS — E119 Type 2 diabetes mellitus without complications: Secondary | ICD-10-CM

## 2022-08-05 MED ORDER — ONETOUCH ULTRA 2 W/DEVICE KIT: PACK | 0 refills | Status: AC

## 2022-08-05 NOTE — Progress Notes (Signed)
S:     Chief Complaint  Patient presents with   Medication Management    T2DM, glucometer education   62 y.o. female who presents for diabetes evaluation, education, and management.  PMH is significant for T2DM, HTN, chronic pain, fibroids, and iron deficiency anemia.  Patient was referred by Dr. Gerilyn Pilgrim, on 07/22/2022. Last seen by PCP on 06/28/2022   Today, patient arrives in good spirits and presents with her grandson. She brings her glucometer in today for teaching.   Patient reports Diabetes was diagnosed a few years ago, but less than 10.   Current diabetes medications include: metformin 1000 mg BID, Ozempic (semaglutide) 2 mg weekly Current hypertension medications include: HCTZ 25 mg daily, metoprolol tartrate 25 mg once daily (prescribed BID), losartan 50 mg daily Current hyperlipidemia medications include: atorvastatin 10 mg daily (patient states she is no longer taking)  Patient reports adherence to taking all medications as prescribed.   Insurance coverage: [employer]HC Dual Complete + Medicaid  Patient denies hypoglycemic events. Although she has never checked BG at home  Patient reports nocturia (nighttime urination).  Patient reports neuropathy (nerve pain). Patient reports visual changes. Patient reports self foot exams.   O:  Review of Systems  Constitutional:  Malaise/fatigue: fatigue.  Respiratory:  Positive for shortness of breath.   Genitourinary:  Positive for urgency.    Physical Exam Constitutional:      Appearance: Normal appearance.  Pulmonary:     Effort: Pulmonary effort is normal.  Neurological:     Mental Status: She is alert.  Psychiatric:        Mood and Affect: Mood normal.        Behavior: Behavior normal.        Thought Content: Thought content normal.      Lab Results  Component Value Date   HGBA1C 6.0 06/28/2022    Lipid Panel     Component Value Date/Time   CHOL 205 (H) 05/31/2008 2041   TRIG 154 (H) 05/31/2008 2041   HDL 67  05/31/2008 2041   CHOLHDL 3.1 Ratio 05/31/2008 2041   VLDL 31 05/31/2008 2041   LDLCALC 107 (H) 05/31/2008 2041   LDLDIRECT 94 06/09/2009 2042    Clinical Atherosclerotic Cardiovascular Disease (ASCVD): No  The ASCVD Risk score (Arnett DK, et al., 2019) failed to calculate for the following reasons:   Cannot find a previous HDL lab   Cannot find a previous total cholesterol lab   A/P: Diabetes longstanding currently controlled based on A1c. Patient is able to verbalize appropriate hypoglycemia management plan. Medication adherence appears appropriate.  -Continued GLP-1 Ozempic (semaglutide) 2 mg weekly.  -Continued metformin 1000 mg BID.  -Patient was educated on the use of the OneTouch Ultra2 blood glucose meter. Reviewed necessary supplies and operation of the meter. Also reviewed goal blood glucose levels. Patient was able to demonstrate use. All questions and concerns were addressed. -Extensively discussed pathophysiology of diabetes, recommended lifestyle interventions, dietary effects on blood sugar control.  -Counseled on s/sx of and management of hypoglycemia.  -Next A1c anticipated Sept 2024.   ASCVD risk - primary prevention in patient with diabetes. Last LDL is not at goal of <70 mg/dL; however, last LDL was taken in 2020. Moderate intensity statin indicated. Patient states she was taken off atorvastatin due to unknown reason.  -If appropriate, consider restarting atorvastatin 10 mg at PCP follow up tomorrow.   Written patient instructions provided. Patient verbalized understanding of treatment plan.  Total time in face to  face counseling 30 minutes.    Follow-up:  Pharmacist PRN. PCP clinic visit in 08/06/2022.  Patient seen with Earl Gala PGY-1 Pharmacy Resident, Revonda Standard, PharmD Candidate and Valeda Malm, PharmD, PGY2 Pharmacy Resident.

## 2022-08-05 NOTE — Patient Instructions (Signed)
It was nice to see you today!  Your goal blood sugar is 80-130 before eating and less than 180 after eating.  No Medication Changes  Monitor blood sugars at home and keep a log (glucometer or piece of paper) to bring with you to your next visit.  Keep up the good work with diet and exercise. Aim for a diet full of vegetables, fruit and lean meats (chicken, [place], fish). Try to limit salt intake by eating fresh or frozen vegetables (instead of canned), rinse canned vegetables prior to cooking and do not add any additional salt to meals.

## 2022-08-05 NOTE — Progress Notes (Signed)
Reviewed and agree with Dr Koval's plan.   

## 2022-08-05 NOTE — Assessment & Plan Note (Signed)
Diabetes longstanding currently controlled based on A1c. Patient is able to verbalize appropriate hypoglycemia management plan. Medication adherence appears appropriate.  -Continued GLP-1 Ozempic (semaglutide) 2 mg weekly.  -Continued metformin 1000 mg BID.  -Patient was educated on the use of the OneTouch Ultra2 blood glucose meter. Reviewed necessary supplies and operation of the meter. Also reviewed goal blood glucose levels. Patient was able to demonstrate use. All questions and concerns were addressed. -Extensively discussed pathophysiology of diabetes, recommended lifestyle interventions, dietary effects on blood sugar control.  -Counseled on s/sx of and management of hypoglycemia.

## 2022-08-06 ENCOUNTER — Encounter: Payer: Self-pay | Admitting: Family Medicine

## 2022-08-06 ENCOUNTER — Other Ambulatory Visit: Payer: Self-pay

## 2022-08-06 ENCOUNTER — Ambulatory Visit (INDEPENDENT_AMBULATORY_CARE_PROVIDER_SITE_OTHER): Payer: 59 | Admitting: Family Medicine

## 2022-08-06 VITALS — BP 118/54 | HR 97 | Ht 68.0 in | Wt 236.0 lb

## 2022-08-06 DIAGNOSIS — E66812 Obesity, class 2: Secondary | ICD-10-CM

## 2022-08-06 DIAGNOSIS — E669 Obesity, unspecified: Secondary | ICD-10-CM

## 2022-08-06 DIAGNOSIS — M67911 Unspecified disorder of synovium and tendon, right shoulder: Secondary | ICD-10-CM

## 2022-08-06 NOTE — Progress Notes (Unsigned)
    SUBJECTIVE:   CHIEF COMPLAINT / HPI:   Desire for Weight loss  And says she is having trouble losing weight even though we have increased her Ozempic to 2 mg.  She says that she has tried to eat a little bit healthier after having her appointment with Dr. Gerilyn Pilgrim however she still gets confused with amount of protein to eat.  She says that she has not been increasing her exercise and could make more efforts for this.  Says that she has a friend whom she can go walking with.  Right Shoulder Pain  Patient reports that she has been having worse right shoulder pain.  She says that she had a complete right shoulder rotator cuff tear in the past that had been repaired.  She says that now there is pain in the shoulder and causes her to have tingling down her arm.  She says that it gets very cold in that arm occasionally to the point where it is painful.  Does not remember any inciting event that could have caused this.  She is still taking her gabapentin.  Pain does not started her neck and she has not had any neck or back injuries recently.  PERTINENT  PMH / PSH: Type 2 diabetes, tendinopathy of the right rotator cuff  OBJECTIVE:   BP (!) 118/54   Pulse 97   Ht  (1.727 m)   Wt 236 lb (107 kg)   SpO2 100%   BMI 35.88 kg/m   General: well appearing, in no acute distress CV: RRR, radial pulses equal and palpable, no BLE edema  Resp: Normal work of breathing on room air, CTAB Abd: Soft, non tender, non distended  Neuro: Alert & Oriented x 4  MSK: surgical scar on anterior aspect of right shoulder, no strength discrepancies, no deficits in proprioception    ASSESSMENT/PLAN:   Tendinopathy of right rotator cuff Patient continues to have tendinopathy of her right shoulder.  She is currently on Percocet 10-325 mg as prescribed by [facility].  However she now has tingling and temperature difference sensations going down her arm.  This is new from prior.  Unlikely radicular from her neck as  tingling and pain is mostly in her shoulder and more distal. - Continue pain medication as prescribed by [facility] - Referral to sports medicine for possible ultrasound of right shoulder and evaluation  Obesity, Class II, BMI 35-39.9 Patient has gained 3 pounds since last visit however she was about 15 pounds heavier in June of last year.  She has lost weight since starting Ozempic but response has not been as drastic as patient was hoping for.  Patient has now connected with Dr. Gerilyn Pilgrim RD and has initiated lifestyle interventions.  However most likely needs to make more lifestyle interventions to make a bigger impact. - Continue Ozempic 2 mg weekly - Follow-up at next appointment with Dr. Gerilyn Pilgrim - Increase physical activity. - Follow-up in 3 months     Lockie Mola, MD [facility]

## 2022-08-06 NOTE — Patient Instructions (Signed)
It was wonderful to see you today.  Please bring ALL of your medications with you to every visit.   Today we talked about:  Right shoulder pain and tingling - I made a referral to sports medicine look out for a phone call.   Please have [facility] fax [place] at [phone]  Please follow up in 2-3 months   Thank you for choosing [facility].   Please call [phone] with any questions about today's appointment.  Please be sure to schedule follow up at the front desk before you leave today.   Lockie Mola, MD  Family Medicine

## 2022-08-08 NOTE — Assessment & Plan Note (Signed)
Patient has gained 3 pounds since last visit however she was about 15 pounds heavier in June of last year.  She has lost weight since starting Ozempic but response has not been as drastic as patient was hoping for.  Patient has now connected with Dr. Gerilyn Pilgrim RD and has initiated lifestyle interventions.  However most likely needs to make more lifestyle interventions to make a bigger impact. - Continue Ozempic 2 mg weekly - Follow-up at next appointment with Dr. Gerilyn Pilgrim - Increase physical activity. - Follow-up in 3 months

## 2022-08-08 NOTE — Assessment & Plan Note (Signed)
Patient continues to have tendinopathy of her right shoulder.  She is currently on Percocet 10-325 mg as prescribed by [facility].  However she now has tingling and temperature difference sensations going down her arm.  This is new from prior.  Unlikely radicular from her neck as tingling and pain is mostly in her shoulder and more distal. - Continue pain medication as prescribed by [facility] - Referral to sports medicine for possible ultrasound of right shoulder and evaluation

## 2022-08-12 ENCOUNTER — Ambulatory Visit: Payer: 59 | Admitting: Family Medicine

## 2022-08-16 ENCOUNTER — Ambulatory Visit (INDEPENDENT_AMBULATORY_CARE_PROVIDER_SITE_OTHER): Payer: 59 | Admitting: Family Medicine

## 2022-08-16 ENCOUNTER — Other Ambulatory Visit: Payer: Self-pay

## 2022-08-16 VITALS — BP 130/86 | Ht 68.0 in | Wt 228.0 lb

## 2022-08-16 DIAGNOSIS — M25511 Pain in right shoulder: Secondary | ICD-10-CM | POA: Diagnosis not present

## 2022-08-16 NOTE — Patient Instructions (Signed)
You have rotator cuff impingement Try to avoid painful activities (overhead activities, lifting with extended arm) as much as possible. Aleve 2 tabs twice a day with food for pain and inflammation  - take for 7 days then as needed. Can take tylenol in addition to this. Subacromial injection with toradol may be beneficial to help with pain and to decrease inflammation - call me if you're struggling and we can do this. Consider physical therapy with transition to home exercise program. Do home exercise program with theraband and scapular stabilization exercises daily 3 sets of 10 once a day. If not improving at follow-up we will consider further imaging, injection, physical therapy, and/or nitro patches. Follow up with me in 1 month if you're improving but call me sooner if you're struggling.

## 2022-08-17 ENCOUNTER — Encounter: Payer: Self-pay | Admitting: Family Medicine

## 2022-08-17 NOTE — Progress Notes (Signed)
PCP: Lockie Mola, MD  Subjective:   HPI: Patient is a 62 y.o. female here for right shoulder pain.  Patient reports above 1-1.5 months ago she started to get lateral right shoulder pain. Concerning to her as she's had right rotator cuff repair and symptoms feel similar. She's tried cold water, takes oxycodone for her back. Worse with overhead motions and lying on her right side, worse at night. No numbness/tingling.  Past Medical History:  Diagnosis Date   A-fib (HCC)    Acid reflux    Allergy    Anemia    Anxiety    Arthritis    Back pain    Depression    Diabetes mellitus without complication (HCC)    on meds   Dysrhythmia    Headache    stress headaches, migraines at times   Heart murmur    Hypertension    Irregular heart beat    Left shoulder pain    Shortness of breath dyspnea    with exertion   Tachyarrhythmia    Trigger point of thoracic region 03/22/2012    Current Outpatient Medications on File Prior to Visit  Medication Sig Dispense Refill   albuterol (VENTOLIN HFA) 108 (90 Base) MCG/ACT inhaler SMARTSIG:1-2 Puff(s) Via Inhaler Every 4-6 Hours PRN     ALPRAZolam (XANAX) 0.25 MG tablet Take 0.25 mg by mouth 3 (three) times daily as needed for anxiety.     atorvastatin (LIPITOR) 10 MG tablet Take 1 tablet (10 mg total) by mouth daily. (Patient not taking: Reported on 08/05/2022) 90 tablet 3   baclofen (LIORESAL) 10 MG tablet Take 1 tablet (10 mg total) by mouth 3 (three) times daily. (Patient not taking: Reported on 08/05/2022) 30 tablet 0   benzonatate (TESSALON) 200 MG capsule Take 200 mg by mouth 3 (three) times daily as needed for cough.     Blood Glucose Monitoring Suppl (ONE TOUCH ULTRA 2) w/Device KIT Use as directed 1 kit 0   buPROPion (WELLBUTRIN XL) 300 MG 24 hr tablet Take 300 mg by mouth daily.     cetirizine (ZYRTEC ALLERGY) 10 MG tablet Take 1 tablet (10 mg total) by mouth daily. 30 tablet 0   cyclobenzaprine (FLEXERIL) 10 MG tablet Take 10 mg by  mouth 4 (four) times daily as needed for muscle spasms.     diclofenac Sodium (VOLTAREN) 1 % GEL Please apply 1-2 times daily to affected area 100 g 1   dicyclomine (BENTYL) 10 MG capsule Take 10 mg by mouth 3 (three) times daily as needed for spasms.     EPINEPHrine 0.3 mg/0.3 mL IJ SOAJ injection Inject into the muscle as directed.     fluticasone (FLONASE) 50 MCG/ACT nasal spray Place 2 sprays into both nostrils daily. 16 g 6   gabapentin (NEURONTIN) 800 MG tablet Take 1 tablet (800 mg total) by mouth 4 (four) times daily. (Patient taking differently: Take 800 mg by mouth 2 (two) times daily.) 120 tablet 0   hydrochlorothiazide (HYDRODIURIL) 25 MG tablet Take 1 tablet (25 mg total) by mouth daily. 90 tablet 3   ibuprofen (ADVIL) 600 MG tablet Take 1 tablet (600 mg total) by mouth every 6 (six) hours as needed. Take with breakfast for 5 days. Can continue for longer if symptoms persist. 30 tablet 0   Lancets (ONETOUCH DELICA PLUS LANCET33G) MISC Use as directed     lidocaine (LIDODERM) 5 % Place 1 patch onto the skin daily. Remove & Discard patch within 12 hours or as  directed by MD 30 patch 0   losartan (COZAAR) 50 MG tablet Take 50 mg by mouth daily. (Patient not taking: Reported on 04/22/2021)     meloxicam (MOBIC) 15 MG tablet Take 15 mg by mouth daily.     metFORMIN (GLUCOPHAGE) 1000 MG tablet Take 1,000 mg by mouth 2 (two) times daily.     metoprolol tartrate (LOPRESSOR) 25 MG tablet Take 1 tablet (25 mg total) by mouth 2 (two) times daily. 180 tablet 3   naloxone (NARCAN) nasal spray 4 mg/0.1 mL SMARTSIG:Both Nares     olopatadine (PATANOL) 0.1 % ophthalmic solution SMARTSIG:1-2 Drop(s) In Eye(s) Twice Daily PRN     omeprazole (PRILOSEC) 20 MG capsule Take 2 capsules (40 mg total) by mouth daily. 90 capsule 2   oxyCODONE-acetaminophen (PERCOCET) 10-325 MG tablet Take 1 tablet by mouth 3 (three) times daily.     potassium chloride (KLOR-CON) 10 MEQ tablet Take 10 mEq by mouth 2 (two) times  daily.     RESTASIS 0.05 % ophthalmic emulsion SMARTSIG:1 Drop(s) In Eye(s) Every 12 Hours     Semaglutide, 2 MG/DOSE, 8 MG/3ML SOPN Please inject 2 mg weekly. 9 mL 0   SYMBICORT 160-4.5 MCG/ACT inhaler Inhale 2 puffs into the lungs in the morning and at bedtime.     traZODone (DESYREL) 100 MG tablet TAKE 2 TABLETS BY MOUTH ONCE AT BEDTIME 180 tablet 3   Vitamin D, Ergocalciferol, (DRISDOL) 50000 units CAPS capsule TAKE 1 CAPSULE BY MOUTH EVERY 7 (SEVEN) DAYS. 8 capsule 3   [DISCONTINUED] ferrous sulfate 325 (65 FE) MG tablet Take 1 tablet (325 mg total) by mouth 2 (two) times daily with a meal. 60 tablet 3   [DISCONTINUED] loratadine (CLARITIN) 10 MG tablet Take 1 tablet (10 mg total) by mouth daily. 30 tablet 3   No current facility-administered medications on file prior to visit.    Past Surgical History:  Procedure Laterality Date   ANKLE SURGERY     CHOLECYSTECTOMY N/A 03/20/2020   Procedure: LAPAROSCOPIC CHOLECYSTECTOMY WITH INTRAOPERATIVE CHOLANGIOGRAM AND WEDGE LIVER BIOPSY;  Surgeon: Luretha Murphy, MD;  Location: [facility];  Service: General;  Laterality: N/A;   KNEE SURGERY Left    LUMBAR LAMINECTOMY/DECOMPRESSION MICRODISCECTOMY Left 06/06/2014   Procedure: LUMBAR LAMINECTOMY/DECOMPRESSION MICRODISCECTOMY 1 LEVEL;  Surgeon: Tressie Stalker, MD;  Location: [facility];  Service: Neurosurgery;  Laterality: Left;  Left L5S1 microdiskectomy   ROTATOR CUFF REPAIR Left 02/20/14   TUBAL LIGATION      Allergies  Allergen Reactions   Soma [Carisoprodol] Itching    Itching/rash around mouth, itching back of throat.    Nortriptyline Rash   Prednisone Rash    REACTION: rash and itching.     BP 130/86   Ht 5\' 8"  (1.727 m)   Wt 228 lb (103.4 kg)   BMI 34.67 kg/m       No data to display              No data to display              Objective:  Physical Exam:  Gen: NAD, comfortable in exam room  Right shoulder: No swelling, ecchymoses.  No gross deformity. No TTP  biceps tendon.  Mild tenderness AC joint. FROM with painful arc. Positive Hawkins, Neers. Negative Yergasons. Strength 5/5 with empty can and resisted internal/external rotation.  Pain empty can > external rotation NV intact distally.  Limited MSK u/s right shoulder: anterior aspect of supraspinatus appears thinner but no obvious  tears.  Infraspinatus with hypoechoic change throughout consistent with tendinopathy.  Subscapularis without visible tear.  Subacromial bursitis.   Assessment & Plan:  1. Right shoulder pain - 2/2 rotator cuff impingement and bursitis.  Discussed options - aleve for a week then as needed.  Tylenol as needed.  Home exercises reviewed to start today.  She is allergic to prednisone (broke out with this) so would consider subacromial injection with toradol in future, formal physical therapy, nitro patches.  F/u in 1 month.

## 2022-08-21 ENCOUNTER — Other Ambulatory Visit: Payer: Self-pay | Admitting: Family Medicine

## 2022-08-21 DIAGNOSIS — R252 Cramp and spasm: Secondary | ICD-10-CM

## 2022-08-30 ENCOUNTER — Ambulatory Visit: Payer: 59 | Admitting: Student

## 2022-09-06 ENCOUNTER — Ambulatory Visit: Payer: 59 | Admitting: Family Medicine

## 2022-09-20 ENCOUNTER — Ambulatory Visit: Payer: 59 | Admitting: Family Medicine

## 2022-09-20 ENCOUNTER — Ambulatory Visit: Payer: 59 | Admitting: Student

## 2022-09-20 NOTE — Progress Notes (Deleted)
  SUBJECTIVE:   CHIEF COMPLAINT / HPI:   Back and leg cramps  Back   Legs  PERTINENT  PMH / PSH: ***  Past Medical History:  Diagnosis Date   A-fib (HCC)    Acid reflux    Allergy    Anemia    Anxiety    Arthritis    Back pain    Depression    Diabetes mellitus without complication (HCC)    on meds   Dysrhythmia    Headache    stress headaches, migraines at times   Heart murmur    Hypertension    Irregular heart beat    Left shoulder pain    Shortness of breath dyspnea    with exertion   Tachyarrhythmia    Trigger point of thoracic region 03/22/2012    Patient Care Team: Lockie Mola, MD as PCP - General (Family Medicine) Merwyn Katos, DPM as Consulting Physician (Podiatry) Orpah Cobb, MD as Consulting Physician (Cardiology) Salli Real, MD as Referring Physician (Internal Medicine) [facility] (Ophthalmology) Linna Darner, RD as Dietitian (Family Medicine) OBJECTIVE:  There were no vitals taken for this visit. Physical Exam   ASSESSMENT/PLAN:  There are no diagnoses linked to this encounter. No follow-ups on file. Bess Kinds, MD 09/20/2022, 12:49 PM PGY-***, [facility] {    This will disappear when note is signed, click to select method of visit    :1}

## 2022-09-21 ENCOUNTER — Encounter (HOSPITAL_COMMUNITY): Payer: Self-pay

## 2022-09-21 ENCOUNTER — Other Ambulatory Visit: Payer: Self-pay

## 2022-09-21 ENCOUNTER — Ambulatory Visit (HOSPITAL_COMMUNITY)
Admission: RE | Admit: 2022-09-21 | Discharge: 2022-09-21 | Disposition: A | Discharge status: Home / Self Care | Payer: 59 | Source: Ambulatory Visit | Attending: Physician Assistant | Admitting: Physician Assistant

## 2022-09-21 VITALS — BP 114/75 | HR 110 | Temp 98.9°F | Resp 20

## 2022-09-21 DIAGNOSIS — M791 Myalgia, unspecified site: Secondary | ICD-10-CM | POA: Insufficient documentation

## 2022-09-21 LAB — CBC WITH DIFFERENTIAL/PLATELET
Abs Immature Granulocytes: 0.04 10*3/uL (ref 0.00–0.07)
Basophils Absolute: 0.1 10*3/uL (ref 0.0–0.1)
Basophils Relative: 1 %
Eosinophils Absolute: 0.1 10*3/uL (ref 0.0–0.5)
Eosinophils Relative: 1 %
HCT: 36.4 % (ref 36.0–46.0)
Hemoglobin: 10.5 g/dL — ABNORMAL LOW (ref 12.0–15.0)
Immature Granulocytes: 1 %
Lymphocytes Relative: 30 %
Lymphs Abs: 2.5 10*3/uL (ref 0.7–4.0)
MCH: 21.8 pg — ABNORMAL LOW (ref 26.0–34.0)
MCHC: 28.8 g/dL — ABNORMAL LOW (ref 30.0–36.0)
MCV: 75.5 fL — ABNORMAL LOW (ref 80.0–100.0)
Monocytes Absolute: 0.7 10*3/uL (ref 0.1–1.0)
Monocytes Relative: 8 %
Neutro Abs: 4.8 10*3/uL (ref 1.7–7.7)
Neutrophils Relative %: 59 %
Platelets: 327 10*3/uL (ref 150–400)
RBC: 4.82 MIL/uL (ref 3.87–5.11)
RDW: 18.7 % — ABNORMAL HIGH (ref 11.5–15.5)
WBC: 8.1 10*3/uL (ref 4.0–10.5)
nRBC: 0 % (ref 0.0–0.2)

## 2022-09-21 LAB — COMPREHENSIVE METABOLIC PANEL
ALT: 18 U/L (ref 0–44)
AST: 29 U/L (ref 15–41)
Albumin: 3.6 g/dL (ref 3.5–5.0)
Alkaline Phosphatase: 60 U/L (ref 38–126)
Anion gap: 12 (ref 5–15)
BUN: 10 mg/dL (ref 8–23)
CO2: 27 mmol/L (ref 22–32)
Calcium: 9.5 mg/dL (ref 8.9–10.3)
Chloride: 100 mmol/L (ref 98–111)
Creatinine, Ser: 0.88 mg/dL (ref 0.44–1.00)
GFR, Estimated: >60 mL/min (ref >60)
Glucose, Bld: 83 mg/dL (ref 70–99)
Potassium: 3.3 mmol/L — ABNORMAL LOW (ref 3.5–5.1)
Sodium: 139 mmol/L (ref 135–145)
Total Bilirubin: 0.9 mg/dL (ref 0.3–1.2)
Total Protein: 7.6 g/dL (ref 6.5–8.1)

## 2022-09-21 LAB — MAGNESIUM: Magnesium: 2.1 mg/dL (ref 1.7–2.4)

## 2022-09-21 LAB — TSH: TSH: 0.586 u[IU]/mL (ref 0.350–4.500)

## 2022-09-21 MED ORDER — KETOROLAC TROMETHAMINE 30 MG/ML IJ SOLN
30.0000 mg | Freq: Once | INTRAMUSCULAR | Status: AC
  Administered 2022-09-21: 30 mg via INTRAMUSCULAR

## 2022-09-21 MED ORDER — KETOROLAC TROMETHAMINE 30 MG/ML IJ SOLN
INTRAMUSCULAR | Status: AC
  Filled 2022-09-21: qty 1

## 2022-09-21 NOTE — ED Triage Notes (Signed)
Pt reports leg cramps for 2 weeks . Pt called PCP but could not be worked in . Pt is requesting blood work to Check electrolytes .

## 2022-09-21 NOTE — ED Provider Notes (Signed)
[facility]    CSN: [id number] Arrival date & time: 09/21/22  1848      History   Chief Complaint Chief Complaint  Patient presents with   Extremity Weakness    Severe muscle spasms in legs and calves - Entered by patient   leg cramps    HPI Norma [name] is a 62 y.o. female.   Pt complains of body aches and cramps in her legs.  Pt reports symptoms started a year ago but are becoming worse.  Pt reports difficulty sleeping due to pain .  Pt is in pain management.  Pt reports she called primary and was advised to come here.    The history is provided by the patient. No language interpreter was used.  Extremity Weakness This is a new problem. The problem occurs constantly. The problem has not changed since onset.Nothing aggravates the symptoms. Nothing relieves the symptoms. She has tried nothing for the symptoms.    Past Medical History:  Diagnosis Date   A-fib (HCC)    Acid reflux    Allergy    Anemia    Anxiety    Arthritis    Back pain    Depression    Diabetes mellitus without complication (HCC)    on meds   Dysrhythmia    Headache    stress headaches, migraines at times   Heart murmur    Hypertension    Irregular heart beat    Left shoulder pain    Shortness of breath dyspnea    with exertion   Tachyarrhythmia    Trigger point of thoracic region 03/22/2012    Patient Active Problem List   Diagnosis Date Noted   Viral syndrome 04/05/2022   Generalized abdominal cramping 11/27/2021   Tendinopathy of right rotator cuff 04/04/2020   HCV antibody positive 03/18/2020   RUQ pain 03/17/2020   Biliary colic symptom    Elevated liver enzymes    Abnormal findings on diagnostic imaging of liver and biliary tract    Abdominal pain 03/16/2020   Cholelithiasis 03/16/2020   Hypertensive urgency 03/16/2020   Type 2 diabetes mellitus (HCC) 03/16/2020   Lead exposure 11/14/2019   TMJ (temporomandibular joint disorder) 07/31/2019   Left hip pain  02/27/2019   Uterine mass 12/19/2018   Allergic rhinitis with postnasal drip 11/17/2018   Abdominal pain, left lower quadrant 06/23/2018   Diverticulosis of colon without hemorrhage 06/23/2018   Intractable migraine without aura and without status migrainosus 02/16/2018   Iron deficiency anemia 03/29/2017   Vitamin D deficiency 03/01/2017   Lumbar herniated disc 06/06/2014   Depression    Spinal stenosis, lumbar region, with neurogenic claudication 11/21/2013   Hot flashes 06/30/2011   Eczema, dyshidrotic 06/02/2010   FIBROIDS, UTERUS 10/24/2009   GERD 06/09/2009   Chronic pain syndrome 10/04/2008   Hepatitis C carrier (HCC) 03/21/2007   Obesity, Class II, BMI 35-39.9 06/16/2006   HYPERTENSION, BENIGN SYSTEMIC 06/16/2006    Past Surgical History:  Procedure Laterality Date   ANKLE SURGERY     CHOLECYSTECTOMY N/A 03/20/2020   Procedure: LAPAROSCOPIC CHOLECYSTECTOMY WITH INTRAOPERATIVE CHOLANGIOGRAM AND WEDGE LIVER BIOPSY;  Surgeon: Luretha Murphy, MD;  Location: [facility];  Service: General;  Laterality: N/A;   KNEE SURGERY Left    LUMBAR LAMINECTOMY/DECOMPRESSION MICRODISCECTOMY Left 06/06/2014   Procedure: LUMBAR LAMINECTOMY/DECOMPRESSION MICRODISCECTOMY 1 LEVEL;  Surgeon: Tressie Stalker, MD;  Location: [facility];  Service: Neurosurgery;  Laterality: Left;  Left L5S1 microdiskectomy   ROTATOR CUFF REPAIR Left  02/20/14   TUBAL LIGATION      OB History     Gravida  4   Para  4   Term  4   Preterm      AB  0   Living  4      SAB  0   IAB  0   Ectopic  0   Multiple  0   Live Births  4            Home Medications    Prior to Admission medications   Medication Sig Start Date End Date Taking? Authorizing Provider  albuterol (VENTOLIN HFA) 108 (90 Base) MCG/ACT inhaler SMARTSIG:1-2 Puff(s) Via Inhaler Every 4-6 Hours PRN 08/25/20  Yes [provider]  ALPRAZolam (XANAX) 0.25 MG tablet Take 0.25 mg by mouth 3 (three) times daily as needed for  anxiety. 07/14/22  Yes [provider]  atorvastatin (LIPITOR) 10 MG tablet Take 1 tablet (10 mg total) by mouth daily. 11/27/21  Yes Ganta, Anupa, DO  benzonatate (TESSALON) 200 MG capsule Take 200 mg by mouth 3 (three) times daily as needed for cough. 05/04/22  Yes [provider]  Blood Glucose Monitoring Suppl (ONE TOUCH ULTRA 2) w/Device KIT Use as directed 08/05/22  Yes McDiarmid, Leighton Roach, MD  buPROPion (WELLBUTRIN XL) 300 MG 24 hr tablet Take 300 mg by mouth daily.   Yes [provider]  cetirizine (ZYRTEC ALLERGY) 10 MG tablet Take 1 tablet (10 mg total) by mouth daily. 11/07/20  Yes Rhys Martini, PA-C  cyclobenzaprine (FLEXERIL) 10 MG tablet Take 10 mg by mouth 4 (four) times daily as needed for muscle spasms. 07/14/22  Yes [provider]  diclofenac Sodium (VOLTAREN) 1 % GEL Please apply 1-2 times daily to affected area 10/09/19  Yes Arlyce Harman, MD  dicyclomine (BENTYL) 10 MG capsule Take 10 mg by mouth 3 (three) times daily as needed for spasms. 07/13/22  Yes [provider]  EPINEPHrine 0.3 mg/0.3 mL IJ SOAJ injection Inject into the muscle as directed. 07/22/20  Yes [provider]  gabapentin (NEURONTIN) 800 MG tablet Take 1 tablet (800 mg total) by mouth 4 (four) times daily. Patient taking differently: Take 800 mg by mouth 2 (two) times daily. 02/19/22  Yes   hydrochlorothiazide (HYDRODIURIL) 25 MG tablet Take 1 tablet (25 mg total) by mouth daily. 11/02/16  Yes Howard Pouch, MD  ibuprofen (ADVIL) 600 MG tablet Take 1 tablet (600 mg total) by mouth every 6 (six) hours as needed. Take with breakfast for 5 days. Can continue for longer if symptoms persist. 11/07/20  Yes Rhys Martini, PA-C  Lancets [facility] DELICA PLUS [place]) MISC Use as directed 06/29/22  Yes [provider]  lidocaine (LIDODERM) 5 % Place 1 patch onto the skin daily. Remove & Discard patch within 12 hours or as directed by MD 12/15/21  Yes Jerre Simon,  MD  losartan (COZAAR) 50 MG tablet Take 50 mg by mouth daily. 01/24/20  Yes [provider]  meloxicam (MOBIC) 15 MG tablet Take 15 mg by mouth daily. 06/22/22  Yes [provider]  metFORMIN (GLUCOPHAGE) 1000 MG tablet Take 1,000 mg by mouth 2 (two) times daily. 10/30/20  Yes [provider]  metoprolol tartrate (LOPRESSOR) 25 MG tablet Take 1 tablet (25 mg total) by mouth 2 (two) times daily. 05/13/14  Yes Uvaldo Rising, MD  naloxone [facility]) nasal spray 4 mg/0.1 mL SMARTSIG:Both Nares 12/29/20  Yes [provider]  olopatadine (  PATANOL) 0.1 % ophthalmic solution SMARTSIG:1-2 Drop(s) In Eye(s) Twice Daily PRN 10/27/20  Yes [provider]  omeprazole (PRILOSEC) 20 MG capsule Take 2 capsules (40 mg total) by mouth daily. 05/25/19  Yes Meccariello, Solmon Ice, MD  oxyCODONE-acetaminophen (PERCOCET) 10-325 MG tablet Take 1 tablet by mouth 3 (three) times daily. 07/23/19  Yes [provider]  potassium chloride (KLOR-CON) 10 MEQ tablet Take 10 mEq by mouth 2 (two) times daily. 01/24/20  Yes [provider]  RESTASIS 0.05 % ophthalmic emulsion SMARTSIG:1 Drop(s) In Eye(s) Every 12 Hours 11/03/20  Yes [provider]  Semaglutide, 2 MG/DOSE, 8 MG/3ML SOPN Please inject 2 mg weekly. 06/29/22  Yes Lockie Mola, MD  SYMBICORT 160-4.5 MCG/ACT inhaler Inhale 2 puffs into the lungs in the morning and at bedtime. 12/25/20  Yes [provider]  traZODone (DESYREL) 100 MG tablet TAKE 2 TABLETS BY MOUTH ONCE AT BEDTIME 05/31/19  Yes Meccariello, Solmon Ice, MD  Vitamin D, Ergocalciferol, (DRISDOL) 50000 units CAPS capsule TAKE 1 CAPSULE BY MOUTH EVERY 7 (SEVEN) DAYS. 12/09/17  Yes Meccariello, Solmon Ice, MD  baclofen (LIORESAL) 10 MG tablet Take 1 tablet (10 mg total) by mouth 3 (three) times daily. Patient not taking: Reported on 08/05/2022 03/16/22   Jerre Simon, MD  fluticasone [facility]) 50 MCG/ACT nasal spray Place 2 sprays into both nostrils  daily. 10/15/19   Meccariello, Solmon Ice, MD  ferrous sulfate 325 (65 FE) MG tablet Take 1 tablet (325 mg total) by mouth 2 (two) times daily with a meal. 02/22/19 04/14/19  Shirley, [place], DO  loratadine (CLARITIN) 10 MG tablet Take 1 tablet (10 mg total) by mouth daily. 04/11/19 08/12/19  Meccariello, Solmon Ice, MD    Family History Family History  Problem Relation Age of Onset   Diabetes Brother    Diabetes Maternal Aunt    Diabetes Paternal Aunt    Diabetes Maternal Grandmother    Diabetes Maternal Grandfather    Diabetes Paternal Grandmother    Diabetes Paternal Grandfather    Congestive Heart Failure Mother    Hypertension Son    Colon cancer Neg Hx    Esophageal cancer Neg Hx    Rectal cancer Neg Hx    Stomach cancer Neg Hx     Social History Social History   Tobacco Use   Smoking status: Never    Passive exposure: Never   Smokeless tobacco: Never  Vaping Use   Vaping Use: Never used  Substance Use Topics   Alcohol use: Yes    Comment: glass of wine occasionally   Drug use: No    Comment: States no longer uses marijuana     Allergies   Soma [carisoprodol], Nortriptyline, and Prednisone   Review of Systems Review of Systems  Musculoskeletal:  Positive for extremity weakness.  All other systems reviewed and are negative.    Physical Exam Triage Vital Signs ED Triage Vitals  Enc Vitals Group     BP 09/21/22 1911 114/75     Pulse Rate 09/21/22 1911 (!) 110     Resp 09/21/22 1911 20     Temp 09/21/22 1911 98.9 F (37.2 C)     Temp src --      SpO2 09/21/22 1911 93 %     Weight --      Height --      Head Circumference --      Peak Flow --      Pain Score 09/21/22 1906 10  Pain Loc --      Pain Edu? --      Excl. in GC? --    No data found.  Updated Vital Signs BP 114/75   Pulse (!) 110   Temp 98.9 F (37.2 C)   Resp 20   SpO2 93%   Visual Acuity Right Eye Distance:   Left Eye Distance:   Bilateral Distance:    Right Eye Near:    Left Eye Near:    Bilateral Near:     Physical Exam Vitals and nursing note reviewed.  Constitutional:      Appearance: She is well-developed.  HENT:     Head: Normocephalic.     Right Ear: Tympanic membrane normal.     Left Ear: Tympanic membrane normal.     Mouth/Throat:     Mouth: Mucous membranes are moist.  Eyes:     Extraocular Movements: Extraocular movements intact.     Pupils: Pupils are equal, round, and reactive to light.  Cardiovascular:     Rate and Rhythm: Normal rate.  Pulmonary:     Effort: Pulmonary effort is normal.  Abdominal:     General: There is no distension.  Musculoskeletal:        General: Normal range of motion.     Cervical back: Normal range of motion.  Skin:    General: Skin is warm.  Neurological:     General: No focal deficit present.     Mental Status: She is alert and oriented to person, place, and time.      UC Treatments / Results  Labs (all labs ordered are listed, but only abnormal results are displayed) Labs Reviewed  CBC WITH DIFFERENTIAL/PLATELET  COMPREHENSIVE METABOLIC PANEL  TSH  MAGNESIUM    EKG   Radiology No results found.  Procedures Procedures (including critical care time)  Medications Ordered in UC Medications  ketorolac (TORADOL) 30 MG/ML injection 30 mg (has no administration in time range)    Initial Impression / Assessment and Plan / UC Course  I have reviewed the triage vital signs and the nursing notes.  Pertinent labs & imaging results that were available during my care of the patient were reviewed by me and considered in my medical decision making (see chart for details).     MDM:  labs ordered, Pt advised to call Family Practice tommorow to scheule to be seen.  Pt given injection of toradol  Final Clinical Impressions(s) / UC Diagnoses   Final diagnoses:  None     Discharge Instructions      Return if any problems.    ED Prescriptions   None    PDMP not reviewed this  encounter.   Elson Areas, [place] 09/21/22 2034

## 2022-09-21 NOTE — Discharge Instructions (Signed)
Return if any problems.

## 2022-09-22 ENCOUNTER — Telehealth: Payer: Self-pay

## 2022-09-22 ENCOUNTER — Other Ambulatory Visit: Payer: Self-pay | Admitting: Student

## 2022-09-22 DIAGNOSIS — M545 Low back pain, unspecified: Secondary | ICD-10-CM

## 2022-09-22 NOTE — Telephone Encounter (Signed)
Patient calls nurse line regarding follow up from urgent care visit yesterday.   She was seen yesterday for muscle spasms.   She received lab work and toradol injection. She reports that this helped with pain, however, pain is returning. She was told to follow up with PCP.   We do not have any clinic availability today. Scheduled patient for tomorrow with PCP.   She is requesting returned call from PCP to go over results from lab work that was taken at urgent care.   Advised that I would send provider message. Patient became tearful, stating, "Am I going to have to wait until tomorrow to know about my results."   Attempted to discuss concerns further, however, line was disconnected.   Will forward to PCP.   Veronda Prude, RN

## 2022-09-22 NOTE — Telephone Encounter (Signed)
Attempted to call patient regarding her results. However, there was no response.

## 2022-09-23 ENCOUNTER — Ambulatory Visit: Payer: 59 | Admitting: Family Medicine

## 2022-09-30 ENCOUNTER — Ambulatory Visit (INDEPENDENT_AMBULATORY_CARE_PROVIDER_SITE_OTHER): Payer: 59 | Admitting: Family Medicine

## 2022-09-30 ENCOUNTER — Encounter: Payer: Self-pay | Admitting: Family Medicine

## 2022-09-30 ENCOUNTER — Other Ambulatory Visit: Payer: Self-pay

## 2022-09-30 VITALS — BP 126/77 | HR 106 | Ht 68.0 in | Wt 224.6 lb

## 2022-09-30 DIAGNOSIS — R252 Cramp and spasm: Secondary | ICD-10-CM | POA: Diagnosis not present

## 2022-09-30 DIAGNOSIS — E119 Type 2 diabetes mellitus without complications: Secondary | ICD-10-CM | POA: Diagnosis not present

## 2022-09-30 DIAGNOSIS — K5900 Constipation, unspecified: Secondary | ICD-10-CM | POA: Diagnosis not present

## 2022-09-30 LAB — POCT GLYCOSYLATED HEMOGLOBIN (HGB A1C): HbA1c, POC (controlled diabetic range): 5.7 % (ref 0.0–7.0)

## 2022-09-30 MED ORDER — POLYETHYLENE GLYCOL 3350 17 GM/SCOOP PO POWD
17.0000 g | Freq: Two times a day (BID) | ORAL | 1 refills | Status: DC | PRN
Start: 2022-09-30 — End: 2023-10-10

## 2022-09-30 MED ORDER — FERROUS GLUCONATE 324 (38 FE) MG PO TABS: 324.0000 mg | ORAL_TABLET | Freq: Every day | ORAL | 1 refills | Status: DC

## 2022-09-30 MED ORDER — METFORMIN HCL ER 500 MG PO TB24
500.0000 mg | ORAL_TABLET | Freq: Two times a day (BID) | ORAL | 3 refills | Status: DC
Start: 2022-09-30 — End: 2023-05-30

## 2022-09-30 NOTE — Patient Instructions (Addendum)
It was wonderful to see you today.  Please bring ALL of your medications with you to every visit.   Today we talked about:  Leg cramping - I believe this is because of iron deficiency and causing your restless leg syndrome.   I am sending in some miralax   Diabetes - You're doing great! I am going to decrease your metformin dose.   Please follow up in 1 month    Thank you for choosing [facility].   Please call [phone] with any questions about today's appointment.  Lockie Mola, MD  Family Medicine

## 2022-09-30 NOTE — Progress Notes (Signed)
    SUBJECTIVE:   CHIEF COMPLAINT / HPI:   Leg cramps  No incontinence. Does feel like she has weakness in her legs. Happens more at night she can't stay in bed. Says she has to get up and "walk it out". Has not had her legs go out from under her or not be able to walk. Says she occasionally gets pain in her legs after sitting for long periods of time or standing for long periods of time. However, night time symptoms are the most bothersome for her. Patient says that she feels she does have some sensation differences in her left leg that have been there for a long time.   Diabetes  Patient has been taking 1000 mg metformin twice a day. No other complaints or side effects.   PERTINENT  PMH / PSH: T2DM, HTN, Herniated lumbar disc, Spinal stenosis,   OBJECTIVE:   BP 126/77   Pulse (!) 106   Ht 5\' 8"  (1.727 m)   Wt 224 lb 9.6 oz (101.9 kg)   SpO2 100%   BMI 34.15 kg/m   General: well appearing, in no acute distress CV: RRR, radial pulses equal and palpable, no BLE edema  Resp: Normal work of breathing on room air, CTAB Abd: Soft, non tender, non distended  Neuro: Alert & Oriented x 4, Normal lowe extremity strength, normal sensation.  MSK: straight leg test negative bilaterally, no pain to palpation over spine or paravertebral muscles.   ASSESSMENT/PLAN:   Type 2 diabetes mellitus without complication, without long-term current use of insulin (HCC) -     POCT glycosylated hemoglobin (Hb A1C) -     Basic metabolic panel -     Microalbumin / creatinine urine ratio -     metFORMIN HCl ER; Take 1 tablet (500 mg total) by mouth 2 (two) times daily with a meal.  Dispense: 180 tablet; Refill: 3  Leg cramps Assessment & Plan: Leg cramping seems to be sleep related and indicative of restless leg syndrome. Patient recently found to have microcytic anemia that could have given her predilection for the disorder.  - Iron supplementation 324 mg daily  - Miralax for constipation  - F/u in one  month  - GI appointment on July 7th for colonoscopy.   Orders: -     Vitamin B12 -     CBC with Differential/Platelet -     Ferrous Gluconate; Take 1 tablet (324 mg total) by mouth daily with breakfast.  Dispense: 30 tablet; Refill: 1  Constipation, unspecified constipation type -     Polyethylene Glycol 3350; Take 17 g by mouth 2 (two) times daily as needed.  Dispense: 3350 g; Refill: 1    Follow up in one month   Lockie Mola, MD [facility]

## 2022-10-01 LAB — CBC WITH DIFFERENTIAL/PLATELET
Basophils Absolute: 0 10*3/uL (ref 0.0–0.2)
Basos: 1 %
EOS (ABSOLUTE): 0.1 10*3/uL (ref 0.0–0.4)
Eos: 2 %
Hematocrit: 35.5 % (ref 34.0–46.6)
Hemoglobin: 10.6 g/dL — ABNORMAL LOW (ref 11.1–15.9)
Immature Grans (Abs): 0 10*3/uL (ref 0.0–0.1)
Immature Granulocytes: 0 %
Lymphocytes Absolute: 2.1 10*3/uL (ref 0.7–3.1)
Lymphs: 40 %
MCH: 22.3 pg — ABNORMAL LOW (ref 26.6–33.0)
MCHC: 29.9 g/dL — ABNORMAL LOW (ref 31.5–35.7)
MCV: 75 fL — ABNORMAL LOW (ref 79–97)
Monocytes Absolute: 0.5 10*3/uL (ref 0.1–0.9)
Monocytes: 8 %
Neutrophils Absolute: 2.6 10*3/uL (ref 1.4–7.0)
Neutrophils: 49 %
Platelets: 294 10*3/uL (ref 150–450)
RBC: 4.75 x10E6/uL (ref 3.77–5.28)
RDW: 18.9 % — ABNORMAL HIGH (ref 11.7–15.4)
WBC: 5.4 10*3/uL (ref 3.4–10.8)

## 2022-10-01 LAB — BASIC METABOLIC PANEL
BUN/Creatinine Ratio: 9 — ABNORMAL LOW (ref 12–28)
BUN: 11 mg/dL (ref 8–27)
CO2: 24 mmol/L (ref 20–29)
Calcium: 9.8 mg/dL (ref 8.7–10.3)
Chloride: 104 mmol/L (ref 96–106)
Creatinine, Ser: 1.18 mg/dL — ABNORMAL HIGH (ref 0.57–1.00)
Glucose: 101 mg/dL — ABNORMAL HIGH (ref 70–99)
Potassium: 3.5 mmol/L (ref 3.5–5.2)
Sodium: 143 mmol/L (ref 134–144)
eGFR: 52 mL/min/{1.73_m2} — ABNORMAL LOW (ref >59)

## 2022-10-01 LAB — MICROALBUMIN / CREATININE URINE RATIO
Creatinine, Urine: 251.5 mg/dL
Microalb/Creat Ratio: 15 mg/g creat (ref 0–29)
Microalbumin, Urine: 38.4 ug/mL

## 2022-10-01 LAB — VITAMIN B12: Vitamin B-12: 715 pg/mL (ref 232–1245)

## 2022-10-01 NOTE — Assessment & Plan Note (Signed)
Leg cramping seems to be sleep related and indicative of restless leg syndrome. Patient recently found to have microcytic anemia that could have given her predilection for the disorder.  - Iron supplementation 324 mg daily  - Miralax for constipation  - F/u in one month  - GI appointment on July 7th for colonoscopy.

## 2022-10-05 ENCOUNTER — Telehealth: Payer: Self-pay

## 2022-10-05 NOTE — Telephone Encounter (Signed)
Called patient to discuss plan from last visit and lab results.  Discussed with patient the normal but normal low potassium. Told her to stop taking potassium pills and focus on eating potassium rich foods.   Encouraged patient to take iron 324 mg daily and offered modifications (with miralax, or every other day iron) if becomes constipated.   She told me she has GI appt on 7/4. Follow up with me in clinic in about one month.

## 2022-10-05 NOTE — Telephone Encounter (Signed)
Patient calls nurse line requesting to speak with provider regarding lab results.   She states that she is unable to access mychart and would like to speak with provider over the phone.   Please return call to patient at [phone].  Veronda Prude, RN

## 2022-10-17 ENCOUNTER — Encounter (HOSPITAL_COMMUNITY): Payer: Self-pay

## 2022-10-17 ENCOUNTER — Ambulatory Visit (HOSPITAL_COMMUNITY)
Admission: RE | Admit: 2022-10-17 | Discharge: 2022-10-17 | Disposition: A | Discharge status: Home / Self Care | Payer: 59 | Source: Ambulatory Visit | Attending: Family Medicine | Admitting: Family Medicine

## 2022-10-17 ENCOUNTER — Ambulatory Visit (INDEPENDENT_AMBULATORY_CARE_PROVIDER_SITE_OTHER): Payer: 59

## 2022-10-17 VITALS — BP 115/78 | HR 96 | Temp 98.2°F | Resp 16

## 2022-10-17 DIAGNOSIS — M545 Low back pain, unspecified: Secondary | ICD-10-CM | POA: Diagnosis not present

## 2022-10-17 DIAGNOSIS — G8929 Other chronic pain: Secondary | ICD-10-CM | POA: Diagnosis not present

## 2022-10-17 NOTE — Discharge Instructions (Addendum)
The x-ray does not show any acute changes in your bones, that is no fractures and no cysts.  I do see degenerative changes.  These are unchanged from prior films  You can continue to take your oxycodone and your muscle relaxer as needed.  You could try some lidocaine patches over-the-counter to apply where this pain is. Also please call your pain management tomorrow when they are open

## 2022-10-17 NOTE — ED Triage Notes (Signed)
Patient c/o left lower back pain that radiates toward her left abdomen x 2 days. Patient denies any UTI symptoms, N/v/d, heavy lifting, or injury.  Patient reports that she Equate Arthritis pills x 2 at 1400 today.

## 2022-10-17 NOTE — ED Provider Notes (Signed)
[facility]    CSN: [id number] Arrival date & time: 10/17/22  1644      History   Chief Complaint Chief Complaint  Patient presents with   Back Pain    HPI Norma [name] is a 62 y.o. female.    Back Pain  Here for pain in her low back.  Began bothering her yesterday as a sharp pain in her midline, around where she had surgery 7 years ago.  Now it is wrapping around and is bothering her in her left side and flank.  It is not wrapping around to her abdomen.  No dysuria or hematuria.  No fever or chills or rash.  No trauma or fall  On PMP she feels 180 oxycodone-acetaminophen 10 mg once monthly.  She states she only takes those as needed.  Today she took 2  Arthritis strength acetaminophen  She also already has Flexeril that she takes regularly at home. Past Medical History:  Diagnosis Date   A-fib (HCC)    Acid reflux    Allergy    Anemia    Anxiety    Arthritis    Back pain    Depression    Diabetes mellitus without complication (HCC)    on meds   Dysrhythmia    Headache    stress headaches, migraines at times   Heart murmur    Hypertension    Irregular heart beat    Left shoulder pain    Shortness of breath dyspnea    with exertion   Tachyarrhythmia    Trigger point of thoracic region 03/22/2012    Patient Active Problem List   Diagnosis Date Noted   Viral syndrome 04/05/2022   Generalized abdominal cramping 11/27/2021   Tendinopathy of right rotator cuff 04/04/2020   HCV antibody positive 03/18/2020   RUQ pain 03/17/2020   Biliary colic symptom    Elevated liver enzymes    Abnormal findings on diagnostic imaging of liver and biliary tract    Abdominal pain 03/16/2020   Cholelithiasis 03/16/2020   Hypertensive urgency 03/16/2020   Type 2 diabetes mellitus (HCC) 03/16/2020   Lead exposure 11/14/2019   TMJ (temporomandibular joint disorder) 07/31/2019   Left hip pain 02/27/2019   Uterine mass 12/19/2018   Allergic rhinitis with  postnasal drip 11/17/2018   Abdominal pain, left lower quadrant 06/23/2018   Diverticulosis of colon without hemorrhage 06/23/2018   Intractable migraine without aura and without status migrainosus 02/16/2018   Iron deficiency anemia 03/29/2017   Vitamin D deficiency 03/01/2017   Lumbar herniated disc 06/06/2014   Depression    Spinal stenosis, lumbar region, with neurogenic claudication 11/21/2013   Hot flashes 06/30/2011   Eczema, dyshidrotic 06/02/2010   Leg cramps 05/06/2010   FIBROIDS, UTERUS 10/24/2009   GERD 06/09/2009   Chronic pain syndrome 10/04/2008   Hepatitis C carrier (HCC) 03/21/2007   Obesity, Class II, BMI 35-39.9 06/16/2006   HYPERTENSION, BENIGN SYSTEMIC 06/16/2006    Past Surgical History:  Procedure Laterality Date   ANKLE SURGERY     CHOLECYSTECTOMY N/A 03/20/2020   Procedure: LAPAROSCOPIC CHOLECYSTECTOMY WITH INTRAOPERATIVE CHOLANGIOGRAM AND WEDGE LIVER BIOPSY;  Surgeon: Luretha Murphy, MD;  Location: [facility];  Service: General;  Laterality: N/A;   KNEE SURGERY Left    LUMBAR LAMINECTOMY/DECOMPRESSION MICRODISCECTOMY Left 06/06/2014   Procedure: LUMBAR LAMINECTOMY/DECOMPRESSION MICRODISCECTOMY 1 LEVEL;  Surgeon: Tressie Stalker, MD;  Location: [facility];  Service: Neurosurgery;  Laterality: Left;  Left L5S1 microdiskectomy   ROTATOR CUFF REPAIR  Left 02/20/14   TUBAL LIGATION      OB History     Gravida  4   Para  4   Term  4   Preterm      AB  0   Living  4      SAB  0   IAB  0   Ectopic  0   Multiple  0   Live Births  4            Home Medications    Prior to Admission medications   Medication Sig Start Date End Date Taking? Authorizing Provider  albuterol (VENTOLIN HFA) 108 (90 Base) MCG/ACT inhaler SMARTSIG:1-2 Puff(s) Via Inhaler Every 4-6 Hours PRN 08/25/20   [provider]  ALPRAZolam (XANAX) 0.25 MG tablet Take 0.25 mg by mouth 3 (three) times daily as needed for anxiety. 07/14/22   [provider]   atorvastatin (LIPITOR) 10 MG tablet Take 1 tablet (10 mg total) by mouth daily. 11/27/21   Ganta, Anupa, DO  Blood Glucose Monitoring Suppl (ONE TOUCH ULTRA 2) w/Device KIT Use as directed 08/05/22   McDiarmid, Leighton Roach, MD  buPROPion (WELLBUTRIN XL) 300 MG 24 hr tablet Take 300 mg by mouth daily.    [provider]  cetirizine (ZYRTEC ALLERGY) 10 MG tablet Take 1 tablet (10 mg total) by mouth daily. 11/07/20   Rhys Martini, PA-C  cyclobenzaprine (FLEXERIL) 10 MG tablet Take 10 mg by mouth 4 (four) times daily as needed for muscle spasms. 07/14/22   [provider]  diclofenac Sodium (VOLTAREN) 1 % GEL Please apply 1-2 times daily to affected area 10/09/19   Arlyce Harman, MD  dicyclomine (BENTYL) 10 MG capsule Take 10 mg by mouth 3 (three) times daily as needed for spasms. 07/13/22   [provider]  EPINEPHrine 0.3 mg/0.3 mL IJ SOAJ injection Inject into the muscle as directed. 07/22/20   [provider]  ferrous gluconate (FERGON) 324 MG tablet Take 1 tablet (324 mg total) by mouth daily with breakfast. 09/30/22   Lockie Mola, MD  fluticasone (FLONASE) 50 MCG/ACT nasal spray Place 2 sprays into both nostrils daily. 10/15/19   Meccariello, Solmon Ice, MD  gabapentin (NEURONTIN) 800 MG tablet Take 1 tablet (800 mg total) by mouth 4 (four) times daily. Patient taking differently: Take 800 mg by mouth 2 (two) times daily. 02/19/22     hydrochlorothiazide (HYDRODIURIL) 25 MG tablet Take 1 tablet (25 mg total) by mouth daily. 11/02/16   Howard Pouch, MD  Lancets [facility] DELICA PLUS [place]) MISC Use as directed 06/29/22   [provider]  lidocaine (LIDODERM) 5 % Place 1 patch onto the skin daily. Remove & Discard patch within 12 hours or as directed by MD 09/22/22   Lockie Mola, MD  losartan (COZAAR) 50 MG tablet Take 50 mg by mouth daily. 01/24/20   [provider]  meloxicam (MOBIC) 15 MG tablet Take 15 mg by mouth daily. 06/22/22   [provider]  metFORMIN (GLUCOPHAGE-XR) 500 MG 24 hr tablet Take 1 tablet (500 mg total) by mouth 2 (two) times daily with a meal. 09/30/22   Lockie Mola, MD  metoprolol tartrate (LOPRESSOR) 25 MG tablet Take 1 tablet (25 mg total) by mouth 2 (two) times daily. 05/13/14   Uvaldo Rising, MD  naloxone [facility]) nasal spray 4 mg/0.1 mL SMARTSIG:Both Nares 12/29/20   [provider]  olopatadine (PATANOL) 0.1 % ophthalmic solution SMARTSIG:1-2 Drop(s) In Eye(s) Twice Daily  PRN 10/27/20   [provider]  omeprazole (PRILOSEC) 20 MG capsule Take 2 capsules (40 mg total) by mouth daily. 05/25/19   Meccariello, Solmon Ice, MD  oxyCODONE-acetaminophen (PERCOCET) 10-325 MG tablet Take 1 tablet by mouth 3 (three) times daily. 07/23/19   [provider]  polyethylene glycol powder (GLYCOLAX/MIRALAX) 17 GM/SCOOP powder Take 17 g by mouth 2 (two) times daily as needed. 09/30/22   Lockie Mola, MD  potassium chloride (KLOR-CON) 10 MEQ tablet Take 10 mEq by mouth 2 (two) times daily. 01/24/20   [provider]  RESTASIS 0.05 % ophthalmic emulsion SMARTSIG:1 Drop(s) In Eye(s) Every 12 Hours 11/03/20   [provider]  Semaglutide, 2 MG/DOSE, 8 MG/3ML SOPN Please inject 2 mg weekly. 06/29/22   Lockie Mola, MD  SYMBICORT 160-4.5 MCG/ACT inhaler Inhale 2 puffs into the lungs in the morning and at bedtime. 12/25/20   [provider]  traZODone (DESYREL) 100 MG tablet TAKE 2 TABLETS BY MOUTH ONCE AT BEDTIME 05/31/19   Meccariello, Solmon Ice, MD  Vitamin D, Ergocalciferol, (DRISDOL) 50000 units CAPS capsule TAKE 1 CAPSULE BY MOUTH EVERY 7 (SEVEN) DAYS. 12/09/17   Meccariello, Solmon Ice, MD  ferrous sulfate 325 (65 FE) MG tablet Take 1 tablet (325 mg total) by mouth 2 (two) times daily with a meal. 02/22/19 04/14/19  Shirley, [place], DO  loratadine (CLARITIN) 10 MG tablet Take 1 tablet (10 mg total) by mouth daily. 04/11/19 08/12/19  Meccariello, Solmon Ice, MD    Family  History Family History  Problem Relation Age of Onset   Diabetes Brother    Diabetes Maternal Aunt    Diabetes Paternal Aunt    Diabetes Maternal Grandmother    Diabetes Maternal Grandfather    Diabetes Paternal Grandmother    Diabetes Paternal Grandfather    Congestive Heart Failure Mother    Hypertension Son    Colon cancer Neg Hx    Esophageal cancer Neg Hx    Rectal cancer Neg Hx    Stomach cancer Neg Hx     Social History Social History   Tobacco Use   Smoking status: Never    Passive exposure: Never   Smokeless tobacco: Never  Vaping Use   Vaping Use: Never used  Substance Use Topics   Alcohol use: Yes    Comment: glass of wine occasionally   Drug use: No    Comment: States no longer uses marijuana     Allergies   Soma [carisoprodol], Nortriptyline, and Prednisone   Review of Systems Review of Systems  Musculoskeletal:  Positive for back pain.     Physical Exam Triage Vital Signs ED Triage Vitals  Enc Vitals Group     BP 10/17/22 1707 115/78     Pulse Rate 10/17/22 1707 96     Resp 10/17/22 1707 16     Temp 10/17/22 1707 98.2 F (36.8 C)     Temp Source 10/17/22 1707 Oral     SpO2 10/17/22 1707 94 %     Weight --      Height --      Head Circumference --      Peak Flow --      Pain Score 10/17/22 1708 10     Pain Loc --      Pain Edu? --      Excl. in GC? --    No data found.  Updated Vital Signs BP 115/78 (BP Location: Left Arm)   Pulse 96   Temp 98.2  F (36.8 C) (Oral)   Resp 16   SpO2 94%   Visual Acuity Right Eye Distance:   Left Eye Distance:   Bilateral Distance:    Right Eye Near:   Left Eye Near:    Bilateral Near:     Physical Exam Vitals reviewed.  Constitutional:      General: She is not in acute distress.    Appearance: She is not ill-appearing, toxic-appearing or diaphoretic.  Cardiovascular:     Rate and Rhythm: Normal rate and regular rhythm.     Heart sounds: No murmur heard. Pulmonary:     Effort:  Pulmonary effort is normal.     Breath sounds: Normal breath sounds.  Abdominal:     Palpations: Abdomen is soft.     Tenderness: There is no abdominal tenderness.  Musculoskeletal:     Comments: There is some tenderness of the midline and the left lumbar musculature.  Skin:    Coloration: Skin is not pale.     Findings: No rash.  Neurological:     General: No focal deficit present.     Mental Status: She is alert and oriented to person, place, and time.  Psychiatric:        Behavior: Behavior normal.      UC Treatments / Results  Labs (all labs ordered are listed, but only abnormal results are displayed) Labs Reviewed - No data to display  EKG   Radiology DG Lumbar Spine 2-3 Views  Result Date: 10/17/2022 CLINICAL DATA:  Low back pain EXAM: LUMBAR SPINE - 2-3 VIEW COMPARISON:  CT 03/31/2021 FINDINGS: Stable lumbar alignment. Vertebral body heights are maintained. Multilevel degenerative changes with moderate severe disc space narrowing at L3-L4 and L5-S1. Prominent facet degenerative changes of the lower lumbar spine. IMPRESSION: Multilevel degenerative changes.  No acute osseous abnormality Electronically Signed   By: Jasmine Pang M.D.   On: 10/17/2022 18:04    Procedures Procedures (including critical care time)  Medications Ordered in UC Medications - No data to display  Initial Impression / Assessment and Plan / UC Course  I have reviewed the triage vital signs and the nursing notes.  Pertinent labs & imaging results that were available during my care of the patient were reviewed by me and considered in my medical decision making (see chart for details).        X-ray shows no changes compared to prior films.  There are some degenerative changes at all levels.  Advised her to take her Percocet as needed and let her pain management no about her new pain tomorrow when they are open.  I have also asked her to acquire some lidocaine patches over-the-counter to  apply where it is hurting her. Final Clinical Impressions(s) / UC Diagnoses   Final diagnoses:  Chronic left-sided low back pain without sciatica     Discharge Instructions      The x-ray does not show any acute changes in your bones, that is no fractures and no cysts.  I do see degenerative changes.  These are unchanged from prior films  You can continue to take your oxycodone and your muscle relaxer as needed.  You could try some lidocaine patches over-the-counter to apply where this pain is. Also please call your pain management tomorrow when they are open      ED Prescriptions   None    I have reviewed the PDMP during this encounter.   Zenia Resides, MD 10/17/22 [id number]

## 2022-10-18 ENCOUNTER — Ambulatory Visit (INDEPENDENT_AMBULATORY_CARE_PROVIDER_SITE_OTHER): Payer: 59

## 2022-10-18 VITALS — Ht 68.0 in | Wt 224.0 lb

## 2022-10-18 DIAGNOSIS — Z0000 Encounter for general adult medical examination without abnormal findings: Secondary | ICD-10-CM | POA: Diagnosis not present

## 2022-10-18 NOTE — Patient Instructions (Signed)
Norma [name] , Thank you for taking time to come for your Medicare Wellness Visit. I appreciate your ongoing commitment to your health goals. Please review the following plan we discussed and let me know if I can assist you in the future.   These are the goals we discussed:  Goals       exercise      Try to increase exercise to 4 days per week 30 minutes each time; currently limited by back and knee pain.      Weight (lb) < 228 lb (103.4 kg) (pt-stated)      7% weight loss        This is a list of the screening recommended for you and due dates:  Health Maintenance  Topic Date Due   Complete foot exam   Never done   Zoster (Shingles) Vaccine (2 of 2) 05/30/2020   Pap Smear  12/22/2020   Colon Cancer Screening  01/25/2021   Eye exam for diabetics  11/03/2021   COVID-19 Vaccine (5 - 2023-24 season) 12/18/2021   Flu Shot  11/18/2022   Hemoglobin A1C  04/01/2023   Yearly kidney function blood test for diabetes  09/30/2023   Yearly kidney health urinalysis for diabetes  09/30/2023   Medicare Annual Wellness Visit  10/18/2023   Mammogram  05/26/2024   DTaP/Tdap/Td vaccine (3 - Td or Tdap) 04/12/2025   Hepatitis C Screening  Completed   HIV Screening  Completed   HPV Vaccine  Aged Out    Advanced directives: Information on Advanced Care Planning can be found at [facility] of [facility] Advance Health Care Directives Advance Health Care Directives ([email or web address] Please bring a copy of your health care power of attorney and living will to the office to be added to your chart at your convenience.  Conditions/risks identified: Aim for 30 minutes of exercise or brisk walking, 6-8 glasses of water, and 5 servings of fruits and vegetables each day.  Next appointment: Follow up in one year for your annual wellness visit.   Preventive Care 40-64 Years, Female Preventive care refers to lifestyle choices and visits with your health care provider that can promote health and wellness. What  does preventive care include? A yearly physical exam. This is also called an annual well check. Dental exams once or twice a year. Routine eye exams. Ask your health care provider how often you should have your eyes checked. Personal lifestyle choices, including: Daily care of your teeth and gums. Regular physical activity. Eating a healthy diet. Avoiding tobacco and drug use. Limiting alcohol use. Practicing safe sex. Taking low-dose aspirin daily starting at age 6. Taking vitamin and mineral supplements as recommended by your health care provider. What happens during an annual well check? The services and screenings done by your health care provider during your annual well check will depend on your age, overall health, lifestyle risk factors, and family history of disease. Counseling  Your health care provider may ask you questions about your: Alcohol use. Tobacco use. Drug use. Emotional well-being. Home and relationship well-being. Sexual activity. Eating habits. Work and work Astronomer. Method of birth control. Menstrual cycle. Pregnancy history. Screening  You may have the following tests or measurements: Height, weight, and BMI. Blood pressure. Lipid and cholesterol levels. These may be checked every 5 years, or more frequently if you are over 20 years old. Skin check. Lung cancer screening. You may have this screening every year starting at age 85 if you have a 30-pack-year  history of smoking and currently smoke or have quit within the past 15 years. Fecal occult blood test (FOBT) of the stool. You may have this test every year starting at age 2. Flexible sigmoidoscopy or colonoscopy. You may have a sigmoidoscopy every 5 years or a colonoscopy every 10 years starting at age 68. Hepatitis C blood test. Hepatitis B blood test. Sexually transmitted disease (STD) testing. Diabetes screening. This is done by checking your blood sugar (glucose) after you have not eaten  for a while (fasting). You may have this done every 1-3 years. Mammogram. This may be done every 1-2 years. Talk to your health care provider about when you should start having regular mammograms. This may depend on whether you have a family history of breast cancer. BRCA-related cancer screening. This may be done if you have a family history of breast, ovarian, tubal, or peritoneal cancers. Pelvic exam and Pap test. This may be done every 3 years starting at age 35. Starting at age 37, this may be done every 5 years if you have a Pap test in combination with an HPV test. Bone density scan. This is done to screen for osteoporosis. You may have this scan if you are at high risk for osteoporosis. Discuss your test results, treatment options, and if necessary, the need for more tests with your health care provider. Vaccines  Your health care provider may recommend certain vaccines, such as: Influenza vaccine. This is recommended every year. Tetanus, diphtheria, and acellular pertussis (Tdap, Td) vaccine. You may need a Td booster every 10 years. Zoster vaccine. You may need this after age 13. Pneumococcal 13-valent conjugate (PCV13) vaccine. You may need this if you have certain conditions and were not previously vaccinated. Pneumococcal polysaccharide (PPSV23) vaccine. You may need one or two doses if you smoke cigarettes or if you have certain conditions. Talk to your health care provider about which screenings and vaccines you need and how often you need them. This information is not intended to replace advice given to you by your health care provider. Make sure you discuss any questions you have with your health care provider. Document Released: 05/02/2015 Document Revised: 12/24/2015 Document Reviewed: 02/04/2015 Elsevier Interactive Patient Education  2017 [employer].    Fall Prevention in the Home Falls can cause injuries. They can happen to people of all ages. There are many things you  can do to make your home safe and to help prevent falls. What can I do on the outside of my home? Regularly fix the edges of walkways and driveways and fix any cracks. Remove anything that might make you trip as you walk through a door, such as a raised step or threshold. Trim any bushes or trees on the path to your home. Use bright outdoor lighting. Clear any walking paths of anything that might make someone trip, such as rocks or tools. Regularly check to see if handrails are loose or broken. Make sure that both sides of any steps have handrails. Any raised decks and porches should have guardrails on the edges. Have any leaves, snow, or ice cleared regularly. Use sand or salt on walking paths during winter. Clean up any spills in your garage right away. This includes oil or grease spills. What can I do in the bathroom? Use night lights. Install grab bars by the toilet and in the tub and shower. Do not use towel bars as grab bars. Use non-skid mats or decals in the tub or shower. If you need  to sit down in the shower, use a plastic, non-slip stool. Keep the floor dry. Clean up any water that spills on the floor as soon as it happens. Remove soap buildup in the tub or shower regularly. Attach bath mats securely with double-sided non-slip rug tape. Do not have throw rugs and other things on the floor that can make you trip. What can I do in the bedroom? Use night lights. Make sure that you have a light by your bed that is easy to reach. Do not use any sheets or blankets that are too big for your bed. They should not hang down onto the floor. Have a firm chair that has side arms. You can use this for support while you get dressed. Do not have throw rugs and other things on the floor that can make you trip. What can I do in the kitchen? Clean up any spills right away. Avoid walking on wet floors. Keep items that you use a lot in easy-to-reach places. If you need to reach something above  you, use a strong step stool that has a grab bar. Keep electrical cords out of the way. Do not use floor polish or wax that makes floors slippery. If you must use wax, use non-skid floor wax. Do not have throw rugs and other things on the floor that can make you trip. What can I do with my stairs? Do not leave any items on the stairs. Make sure that there are handrails on both sides of the stairs and use them. Fix handrails that are broken or loose. Make sure that handrails are as long as the stairways. Check any carpeting to make sure that it is firmly attached to the stairs. Fix any carpet that is loose or worn. Avoid having throw rugs at the top or bottom of the stairs. If you do have throw rugs, attach them to the floor with carpet tape. Make sure that you have a light switch at the top of the stairs and the bottom of the stairs. If you do not have them, ask someone to add them for you. What else can I do to help prevent falls? Wear shoes that: Do not have high heels. Have rubber bottoms. Are comfortable and fit you well. Are closed at the toe. Do not wear sandals. If you use a stepladder: Make sure that it is fully opened. Do not climb a closed stepladder. Make sure that both sides of the stepladder are locked into place. Ask someone to hold it for you, if possible. Clearly mark and make sure that you can see: Any grab bars or handrails. First and last steps. Where the edge of each step is. Use tools that help you move around (mobility aids) if they are needed. These include: Canes. Walkers. Scooters. Crutches. Turn on the lights when you go into a dark area. Replace any light bulbs as soon as they burn out. Set up your furniture so you have a clear path. Avoid moving your furniture around. If any of your floors are uneven, fix them. If there are any pets around you, be aware of where they are. Review your medicines with your doctor. Some medicines can make you feel dizzy. This  can increase your chance of falling. Ask your doctor what other things that you can do to help prevent falls. This information is not intended to replace advice given to you by your health care provider. Make sure you discuss any questions you have with your health  care provider. Document Released: 01/30/2009 Document Revised: 09/11/2015 Document Reviewed: 05/10/2014 Elsevier Interactive Patient Education  2017 [employer].

## 2022-10-18 NOTE — Progress Notes (Cosign Needed Addendum)
Subjective:   Norma [name] is a 62 y.o. female who presents for Medicare Annual (Subsequent) preventive examination.  Visit Complete: Virtual  I connected with  Awilda Metro on 10/18/22 by a audio enabled telemedicine application and verified that I am speaking with the correct person using two identifiers.  Patient Location: Home  Provider Location: Home Office  I discussed the limitations of evaluation and management by telemedicine. The patient expressed understanding and agreed to proceed.  Review of Systems     Cardiac Risk Factors include: diabetes mellitus;dyslipidemia;hypertension;sedentary lifestyle     Objective:    Today's Vitals   10/18/22 1858 10/18/22 1859  Weight: 224 lb (101.6 kg)   Height: 5\' 8"  (1.727 m)   PainSc:  9    Body mass index is 34.06 kg/m.     10/18/2022    7:02 PM 08/06/2022    2:58 PM 06/28/2022    1:26 PM 04/05/2022    9:20 AM 03/30/2022    1:59 PM 10/15/2021    2:15 PM 04/22/2021    3:06 PM  Advanced Directives  Does Patient Have a Medical Advance Directive? No No No No No No No  Would patient like information on creating a medical advance directive? Yes (MAU/Ambulatory/Procedural Areas - Information given) No - Patient declined No - Patient declined No - Patient declined No - Patient declined No - Patient declined No - Patient declined    Current Medications (verified) Outpatient Encounter Medications as of 10/18/2022  Medication Sig   albuterol (VENTOLIN HFA) 108 (90 Base) MCG/ACT inhaler SMARTSIG:1-2 Puff(s) Via Inhaler Every 4-6 Hours PRN   ALPRAZolam (XANAX) 0.25 MG tablet Take 0.25 mg by mouth 3 (three) times daily as needed for anxiety.   atorvastatin (LIPITOR) 10 MG tablet Take 1 tablet (10 mg total) by mouth daily.   Blood Glucose Monitoring Suppl (ONE TOUCH ULTRA 2) w/Device KIT Use as directed   buPROPion (WELLBUTRIN XL) 300 MG 24 hr tablet Take 300 mg by mouth daily.   cetirizine (ZYRTEC ALLERGY) 10 MG tablet Take 1 tablet  (10 mg total) by mouth daily.   cyclobenzaprine (FLEXERIL) 10 MG tablet Take 10 mg by mouth 4 (four) times daily as needed for muscle spasms.   diclofenac Sodium (VOLTAREN) 1 % GEL Please apply 1-2 times daily to affected area   dicyclomine (BENTYL) 10 MG capsule Take 10 mg by mouth 3 (three) times daily as needed for spasms.   EPINEPHrine 0.3 mg/0.3 mL IJ SOAJ injection Inject into the muscle as directed.   ferrous gluconate (FERGON) 324 MG tablet Take 1 tablet (324 mg total) by mouth daily with breakfast.   fluticasone (FLONASE) 50 MCG/ACT nasal spray Place 2 sprays into both nostrils daily.   gabapentin (NEURONTIN) 800 MG tablet Take 1 tablet (800 mg total) by mouth 4 (four) times daily. (Patient taking differently: Take 800 mg by mouth 2 (two) times daily.)   hydrochlorothiazide (HYDRODIURIL) 25 MG tablet Take 1 tablet (25 mg total) by mouth daily.   Lancets (ONETOUCH DELICA PLUS LANCET33G) MISC Use as directed   lidocaine (LIDODERM) 5 % Place 1 patch onto the skin daily. Remove & Discard patch within 12 hours or as directed by MD   losartan (COZAAR) 50 MG tablet Take 50 mg by mouth daily.   meloxicam (MOBIC) 15 MG tablet Take 15 mg by mouth daily.   metFORMIN (GLUCOPHAGE-XR) 500 MG 24 hr tablet Take 1 tablet (500 mg total) by mouth 2 (two) times daily with a  meal.   metoprolol tartrate (LOPRESSOR) 25 MG tablet Take 1 tablet (25 mg total) by mouth 2 (two) times daily.   naloxone (NARCAN) nasal spray 4 mg/0.1 mL SMARTSIG:Both Nares   olopatadine (PATANOL) 0.1 % ophthalmic solution SMARTSIG:1-2 Drop(s) In Eye(s) Twice Daily PRN   omeprazole (PRILOSEC) 20 MG capsule Take 2 capsules (40 mg total) by mouth daily.   oxyCODONE-acetaminophen (PERCOCET) 10-325 MG tablet Take 1 tablet by mouth 3 (three) times daily.   polyethylene glycol powder (GLYCOLAX/MIRALAX) 17 GM/SCOOP powder Take 17 g by mouth 2 (two) times daily as needed.   potassium chloride (KLOR-CON) 10 MEQ tablet Take 10 mEq by mouth 2  (two) times daily.   RESTASIS 0.05 % ophthalmic emulsion SMARTSIG:1 Drop(s) In Eye(s) Every 12 Hours   Semaglutide, 2 MG/DOSE, 8 MG/3ML SOPN Please inject 2 mg weekly.   SYMBICORT 160-4.5 MCG/ACT inhaler Inhale 2 puffs into the lungs in the morning and at bedtime.   traZODone (DESYREL) 100 MG tablet TAKE 2 TABLETS BY MOUTH ONCE AT BEDTIME   Vitamin D, Ergocalciferol, (DRISDOL) 50000 units CAPS capsule TAKE 1 CAPSULE BY MOUTH EVERY 7 (SEVEN) DAYS.   [DISCONTINUED] ferrous sulfate 325 (65 FE) MG tablet Take 1 tablet (325 mg total) by mouth 2 (two) times daily with a meal.   [DISCONTINUED] loratadine (CLARITIN) 10 MG tablet Take 1 tablet (10 mg total) by mouth daily.   No facility-administered encounter medications on file as of 10/18/2022.    Allergies (verified) Soma [carisoprodol], Nortriptyline, and Prednisone   History: Past Medical History:  Diagnosis Date   A-fib (HCC)    Acid reflux    Allergy    Anemia    Anxiety    Arthritis    Back pain    Depression    Diabetes mellitus without complication (HCC)    on meds   Dysrhythmia    Headache    stress headaches, migraines at times   Heart murmur    Hypertension    Irregular heart beat    Left shoulder pain    Shortness of breath dyspnea    with exertion   Tachyarrhythmia    Trigger point of thoracic region 03/22/2012   Past Surgical History:  Procedure Laterality Date   ANKLE SURGERY     CHOLECYSTECTOMY N/A 03/20/2020   Procedure: LAPAROSCOPIC CHOLECYSTECTOMY WITH INTRAOPERATIVE CHOLANGIOGRAM AND WEDGE LIVER BIOPSY;  Surgeon: Luretha Murphy, MD;  Location: [facility];  Service: General;  Laterality: N/A;   KNEE SURGERY Left    LUMBAR LAMINECTOMY/DECOMPRESSION MICRODISCECTOMY Left 06/06/2014   Procedure: LUMBAR LAMINECTOMY/DECOMPRESSION MICRODISCECTOMY 1 LEVEL;  Surgeon: Tressie Stalker, MD;  Location: [facility];  Service: Neurosurgery;  Laterality: Left;  Left L5S1 microdiskectomy   ROTATOR CUFF REPAIR Left 02/20/14   TUBAL  LIGATION     Family History  Problem Relation Age of Onset   Diabetes Brother    Diabetes Maternal Aunt    Diabetes Paternal Aunt    Diabetes Maternal Grandmother    Diabetes Maternal Grandfather    Diabetes Paternal Grandmother    Diabetes Paternal Grandfather    Congestive Heart Failure Mother    Hypertension Son    Colon cancer Neg Hx    Esophageal cancer Neg Hx    Rectal cancer Neg Hx    Stomach cancer Neg Hx    Social History   Socioeconomic History   Marital status: Legally Separated    Spouse name: Not on file   Number of children: 4   Years of education:  12   Highest education level: 12th grade  Occupational History   Not on file  Tobacco Use   Smoking status: Never    Passive exposure: Never   Smokeless tobacco: Never  Vaping Use   Vaping Use: Never used  Substance and Sexual Activity   Alcohol use: Yes    Comment: glass of wine occasionally   Drug use: No    Comment: States no longer uses marijuana   Sexual activity: Yes    Birth control/protection: Condom  Other Topics Concern   Not on file  Social History Narrative   Lives by herself, 21 year old son comes and goes.       Has a pitbull dog named Duchess. Lives in one level house with two steps in back. Going to start going back to church. Likes to go out to eat and go to movies. Watches grandchildren, has six with two expected. Has occasional food insecurity.    Social Determinants of Health   Financial Resource Strain: Low Risk  (10/18/2022)   Overall Financial Resource Strain (CARDIA)    Difficulty of Paying Living Expenses: Not very hard  Food Insecurity: No Food Insecurity (10/18/2022)   Hunger Vital Sign    Worried About Running Out of Food in the Last Year: Never true    Ran Out of Food in the Last Year: Never true  Transportation Needs: No Transportation Needs (10/18/2022)   PRAPARE - Administrator, Civil Service (Medical): No    Lack of Transportation (Non-Medical): No   Physical Activity: Insufficiently Active (10/18/2022)   Exercise Vital Sign    Days of Exercise per Week: 3 days    Minutes of Exercise per Session: 30 min  Stress: No Stress Concern Present (10/18/2022)   [employer] of Occupational Health - Occupational Stress Questionnaire    Feeling of Stress : Only a little  Social Connections: Moderately Isolated (10/18/2022)   Social Connection and Isolation Panel [NHANES]    Frequency of Communication with Friends and Family: More than three times a week    Frequency of Social Gatherings with Friends and Family: Twice a week    Attends Religious Services: 1 to 4 times per year    Active Member of [employer] or Organizations: No    Attends Engineer, structural: Never    Marital Status: Never married    Tobacco Counseling Counseling given: Not Answered   Clinical Intake:  Pre-visit preparation completed: Yes  Pain : No/denies pain Pain Score: 9  Pain Type: Acute pain Pain Location: Back Pain Descriptors / Indicators: Constant, Sharp     Diabetes: Yes CBG done?: No Did pt. bring in CBG monitor from home?: No  How often do you need to have someone help you when you read instructions, pamphlets, or other written materials from your doctor or pharmacy?: 1 - Never  Interpreter Needed?: No  Information entered by :: Kandis Fantasia LPN   Activities of Daily Living    10/18/2022    7:02 PM  In your present state of health, do you have any difficulty performing the following activities:  Hearing? 0  Vision? 0  Difficulty concentrating or making decisions? 0  Walking or climbing stairs? 0  Dressing or bathing? 0  Doing errands, shopping? 0  Preparing Food and eating ? N  Using the Toilet? N  In the past six months, have you accidently leaked urine? N  Do you have problems with loss of bowel control? N  Managing your Medications? N  Managing your Finances? N  Housekeeping or managing your Housekeeping? N    Patient Care  Team: Lockie Mola, MD as PCP - General (Family Medicine) Merwyn Katos, DPM as Consulting Physician (Podiatry) Orpah Cobb, MD as Consulting Physician (Cardiology) Salli Real, MD as Referring Physician (Internal Medicine) [facility] (Ophthalmology) Linna Darner, RD as Dietitian (Family Medicine)  Indicate any recent Medical Services you may have received from other than [facility] providers in the past year (date may be approximate).     Assessment:   This is a routine wellness examination for [place].  Hearing/Vision screen Hearing Screening - Comments:: Denies hearing difficulties   Vision Screening - Comments:: No vision problems; will schedule routine eye exam soon    Dietary issues and exercise activities discussed:     Goals Addressed   None   Depression Screen    10/18/2022    7:03 PM 09/30/2022    4:26 PM 08/06/2022    2:58 PM 06/28/2022    1:26 PM 04/05/2022    9:20 AM 03/30/2022    3:10 PM 10/15/2021    2:14 PM  PHQ 2/9 Scores  PHQ - 2 Score 0  0 1 0 1 0  PHQ- 9 Score   0 4 0 3 3  Exception Documentation  Patient refusal         Fall Risk    10/18/2022    7:01 PM 08/06/2022    2:58 PM 06/28/2022    1:26 PM 01/02/2021    4:27 PM 10/15/2019    2:09 PM  Fall Risk   Falls in the past year? 0 0 0 0 0  Number falls in past yr: 0 0 0 0 0  Injury with Fall? 0 0 0 0 0  Risk for fall due to : No Fall Risks   No Fall Risks   Follow up Falls prevention discussed;Education provided;Falls evaluation completed   Falls evaluation completed     MEDICARE RISK AT HOME:  Medicare Risk at Home - 10/18/22 1901     Any stairs in or around the home? Yes    If so, are there any without handrails? No    Home free of loose throw rugs in walkways, pet beds, electrical cords, etc? Yes    Adequate lighting in your home to reduce risk of falls? Yes    Life alert? No    Use of a cane, walker or w/c? No    Grab bars in the bathroom? Yes    Shower chair or bench in shower?  No    Elevated toilet seat or a handicapped toilet? Yes             TIMED UP AND GO:  Was the test performed?  No    Cognitive Function:    01/02/2018    3:19 PM  MMSE - Mini Mental State Exam  Orientation to time 5  Orientation to Place 5  Registration 3  Attention/ Calculation 5  Recall 3  Language- name 2 objects 2  Language- repeat 1  Language- follow 3 step command 3  Language- read & follow direction 1  Write a sentence 1  Copy design 1  Total score 30        10/18/2022    7:02 PM 01/02/2021    3:14 PM 01/02/2018    3:19 PM  6CIT Screen  What Year? 0 points 0 points 0 points  What month? 0 points 0 points  0 points  What time? 0 points 0 points 0 points  Count back from 20 0 points 0 points 0 points  Months in reverse 0 points 0 points 0 points  Repeat phrase 0 points 0 points 0 points  Total Score 0 points 0 points 0 points    Immunizations Immunization History  Administered Date(s) Administered   Influenza Split 01/22/2011, 01/31/2012   Influenza Whole 02/07/2007, 01/19/2008, 02/07/2009, 01/19/2010   Influenza,inj,Quad PF,6+ Mos 01/10/2013, 01/14/2014, 03/06/2015, 02/24/2017, 12/22/2017, 01/23/2019, 02/18/2020, 04/22/2021   Moderna Sars-Covid-2 Vaccination 08/31/2019, 09/26/2019, 04/29/2020   PPD Test 06/02/2010, 06/09/2010, 06/16/2011, 12/04/2012   Pfizer Covid-19 Vaccine Bivalent Booster 53yrs & up 04/22/2021   Pneumococcal Polysaccharide-23 08/21/2019   Td 01/17/2002   Tdap 04/13/2015   Zoster Recombinant(Shingrix) 04/04/2020    TDAP status: Up to date  Pneumococcal vaccine status: Up to date  Covid-19 vaccine status: Information provided on how to obtain vaccines.   Qualifies for Shingles Vaccine? Yes   Zostavax completed Yes   Shingrix Completed?: No.    Education has been provided regarding the importance of this vaccine. Patient has been advised to call insurance company to determine out of pocket expense if they have not yet received  this vaccine. Advised may also receive vaccine at local pharmacy or Health Dept. Verbalized acceptance and understanding.  Screening Tests Health Maintenance  Topic Date Due   FOOT EXAM  Never done   Zoster Vaccines- Shingrix (2 of 2) 05/30/2020   PAP SMEAR-Modifier  12/22/2020   Colonoscopy  01/25/2021   OPHTHALMOLOGY EXAM  11/03/2021   COVID-19 Vaccine (5 - 2023-24 season) 12/18/2021   INFLUENZA VACCINE  11/18/2022   HEMOGLOBIN A1C  04/01/2023   Diabetic kidney evaluation - eGFR measurement  09/30/2023   Diabetic kidney evaluation - Urine ACR  09/30/2023   Medicare Annual Wellness (AWV)  10/18/2023   MAMMOGRAM  05/26/2024   DTaP/Tdap/Td (3 - Td or Tdap) 04/12/2025   Hepatitis C Screening  Completed   HIV Screening  Completed   HPV VACCINES  Aged Out    Health Maintenance  Health Maintenance Due  Topic Date Due   FOOT EXAM  Never done   Zoster Vaccines- Shingrix (2 of 2) 05/30/2020   PAP SMEAR-Modifier  12/22/2020   Colonoscopy  01/25/2021   OPHTHALMOLOGY EXAM  11/03/2021   COVID-19 Vaccine (5 - 2023-24 season) 12/18/2021    Colorectal cancer screening: Type of screening: Colonoscopy. Completed -. Repeat every - years  Mammogram status: Completed 05/26/22. Repeat every year  Lung Cancer Screening: (Low Dose CT Chest recommended if Age 34-80 years, 20 pack-year currently smoking OR have quit w/in 15years.) does not qualify.   Lung Cancer Screening Referral: na/  Additional Screening:  Hepatitis C Screening: does qualify; Completed 03/18/20  Vision Screening: Recommended annual ophthalmology exams for early detection of glaucoma and other disorders of the eye. Is the patient up to date with their annual eye exam?  No  Who is the provider or what is the name of the office in which the patient attends annual eye exams? none If pt is not established with a provider, would they like to be referred to a provider to establish care? No .   Dental Screening: Recommended  annual dental exams for proper oral hygiene  Diabetic Foot Exam: Diabetic Foot Exam: Overdue, Pt has been advised about the importance in completing this exam. Pt is scheduled for diabetic foot exam on next office visit .  Community Resource Referral / Chronic Care Management:  CRR required this visit?  No   CCM required this visit?  No     Plan:     I have personally reviewed and noted the following in the patient's chart:   Medical and social history Use of alcohol, tobacco or illicit drugs  Current medications and supplements including opioid prescriptions. Patient is currently taking opioid prescriptions. Information provided to patient regarding non-opioid alternatives. Patient advised to discuss non-opioid treatment plan with their provider. Functional ability and status Nutritional status Physical activity Advanced directives List of other physicians Hospitalizations, surgeries, and ER visits in previous 12 months Vitals Screenings to include cognitive, depression, and falls Referrals and appointments  In addition, I have reviewed and discussed with patient certain preventive protocols, quality metrics, and best practice recommendations. A written personalized care plan for preventive services as well as general preventive health recommendations were provided to patient.     Kandis Fantasia [place], [place]   04/24/1094   After Visit Summary: (MyChart) Due to this being a telephonic visit, the after visit summary with patients personalized plan was offered to patient via MyChart   Nurse Notes: No concerns

## 2022-10-28 ENCOUNTER — Ambulatory Visit (HOSPITAL_COMMUNITY): Payer: Self-pay

## 2022-10-29 ENCOUNTER — Ambulatory Visit (HOSPITAL_COMMUNITY)
Admission: RE | Admit: 2022-10-29 | Discharge: 2022-10-29 | Disposition: A | Discharge status: Home / Self Care | Payer: 59 | Source: Ambulatory Visit | Attending: Physician Assistant | Admitting: Physician Assistant

## 2022-10-29 ENCOUNTER — Encounter (HOSPITAL_COMMUNITY): Payer: Self-pay

## 2022-10-29 ENCOUNTER — Ambulatory Visit (INDEPENDENT_AMBULATORY_CARE_PROVIDER_SITE_OTHER): Payer: 59

## 2022-10-29 VITALS — BP 125/83 | HR 54 | Temp 98.4°F | Resp 16

## 2022-10-29 DIAGNOSIS — M25571 Pain in right ankle and joints of right foot: Secondary | ICD-10-CM | POA: Diagnosis not present

## 2022-10-29 NOTE — ED Triage Notes (Addendum)
Pt states right ankle pain for the past few weeks.  Denies injury.  States she had surgery in the past on that ankle. States she has been using diclofenac gel at home with relief.

## 2022-10-29 NOTE — Discharge Instructions (Signed)
Can continue with voltaren gel Can wear ankle brace for comfort If no improvement follow up with orthopedics

## 2022-10-29 NOTE — ED Provider Notes (Signed)
[facility]    CSN: [id number] Arrival date & time: 10/29/22  1715      History   Chief Complaint Chief Complaint  Patient presents with   Foot Pain    Entered by patient    HPI Norma [name] is a 62 y.o. female.   Patient with right lateral ankle pain that started several days ago.  Denies new injury or trauma.  She reports previous surgery to the ankle.  She has been apply voltaren gel with minimal improvement.  She reports pain is worse with ambulation.     Past Medical History:  Diagnosis Date   A-fib (HCC)    Acid reflux    Allergy    Anemia    Anxiety    Arthritis    Back pain    Depression    Diabetes mellitus without complication (HCC)    on meds   Dysrhythmia    Headache    stress headaches, migraines at times   Heart murmur    Hypertension    Irregular heart beat    Left shoulder pain    Shortness of breath dyspnea    with exertion   Tachyarrhythmia    Trigger point of thoracic region 03/22/2012    Patient Active Problem List   Diagnosis Date Noted   Viral syndrome 04/05/2022   Generalized abdominal cramping 11/27/2021   Tendinopathy of right rotator cuff 04/04/2020   HCV antibody positive 03/18/2020   RUQ pain 03/17/2020   Biliary colic symptom    Elevated liver enzymes    Abnormal findings on diagnostic imaging of liver and biliary tract    Abdominal pain 03/16/2020   Cholelithiasis 03/16/2020   Hypertensive urgency 03/16/2020   Type 2 diabetes mellitus (HCC) 03/16/2020   Lead exposure 11/14/2019   TMJ (temporomandibular joint disorder) 07/31/2019   Left hip pain 02/27/2019   Uterine mass 12/19/2018   Allergic rhinitis with postnasal drip 11/17/2018   Abdominal pain, left lower quadrant 06/23/2018   Diverticulosis of colon without hemorrhage 06/23/2018   Intractable migraine without aura and without status migrainosus 02/16/2018   Iron deficiency anemia 03/29/2017   Vitamin D deficiency 03/01/2017   Lumbar herniated disc  06/06/2014   Depression    Spinal stenosis, lumbar region, with neurogenic claudication 11/21/2013   Hot flashes 06/30/2011   Eczema, dyshidrotic 06/02/2010   Leg cramps 05/06/2010   FIBROIDS, UTERUS 10/24/2009   GERD 06/09/2009   Chronic pain syndrome 10/04/2008   Hepatitis C carrier (HCC) 03/21/2007   Obesity, Class II, BMI 35-39.9 06/16/2006   HYPERTENSION, BENIGN SYSTEMIC 06/16/2006    Past Surgical History:  Procedure Laterality Date   ANKLE SURGERY     CHOLECYSTECTOMY N/A 03/20/2020   Procedure: LAPAROSCOPIC CHOLECYSTECTOMY WITH INTRAOPERATIVE CHOLANGIOGRAM AND WEDGE LIVER BIOPSY;  Surgeon: Luretha Murphy, MD;  Location: [facility];  Service: General;  Laterality: N/A;   KNEE SURGERY Left    LUMBAR LAMINECTOMY/DECOMPRESSION MICRODISCECTOMY Left 06/06/2014   Procedure: LUMBAR LAMINECTOMY/DECOMPRESSION MICRODISCECTOMY 1 LEVEL;  Surgeon: Tressie Stalker, MD;  Location: [facility];  Service: Neurosurgery;  Laterality: Left;  Left L5S1 microdiskectomy   ROTATOR CUFF REPAIR Left 02/20/14   TUBAL LIGATION      OB History     Gravida  4   Para  4   Term  4   Preterm      AB  0   Living  4      SAB  0   IAB  0  Ectopic  0   Multiple  0   Live Births  4            Home Medications    Prior to Admission medications   Medication Sig Start Date End Date Taking? Authorizing Provider  albuterol (VENTOLIN HFA) 108 (90 Base) MCG/ACT inhaler SMARTSIG:1-2 Puff(s) Via Inhaler Every 4-6 Hours PRN 08/25/20   [provider]  ALPRAZolam (XANAX) 0.25 MG tablet Take 0.25 mg by mouth 3 (three) times daily as needed for anxiety. 07/14/22   [provider]  atorvastatin (LIPITOR) 10 MG tablet Take 1 tablet (10 mg total) by mouth daily. 11/27/21   Ganta, Anupa, DO  Blood Glucose Monitoring Suppl (ONE TOUCH ULTRA 2) w/Device KIT Use as directed 08/05/22   McDiarmid, Leighton Roach, MD  buPROPion (WELLBUTRIN XL) 300 MG 24 hr tablet Take 300 mg by mouth daily.    [provider]  cetirizine (ZYRTEC ALLERGY) 10 MG tablet Take 1 tablet (10 mg total) by mouth daily. 11/07/20   Rhys Martini, PA-C  cyclobenzaprine (FLEXERIL) 10 MG tablet Take 10 mg by mouth 4 (four) times daily as needed for muscle spasms. 07/14/22   [provider]  diclofenac Sodium (VOLTAREN) 1 % GEL Please apply 1-2 times daily to affected area 10/09/19   Arlyce Harman, MD  dicyclomine (BENTYL) 10 MG capsule Take 10 mg by mouth 3 (three) times daily as needed for spasms. 07/13/22   [provider]  EPINEPHrine 0.3 mg/0.3 mL IJ SOAJ injection Inject into the muscle as directed. 07/22/20   [provider]  ferrous gluconate (FERGON) 324 MG tablet Take 1 tablet (324 mg total) by mouth daily with breakfast. 09/30/22   Lockie Mola, MD  fluticasone (FLONASE) 50 MCG/ACT nasal spray Place 2 sprays into both nostrils daily. 10/15/19   Meccariello, Solmon Ice, MD  gabapentin (NEURONTIN) 800 MG tablet Take 1 tablet (800 mg total) by mouth 4 (four) times daily. Patient taking differently: Take 800 mg by mouth 2 (two) times daily. 02/19/22     hydrochlorothiazide (HYDRODIURIL) 25 MG tablet Take 1 tablet (25 mg total) by mouth daily. 11/02/16   Howard Pouch, MD  Lancets [facility] DELICA PLUS [place]) MISC Use as directed 06/29/22   [provider]  lidocaine (LIDODERM) 5 % Place 1 patch onto the skin daily. Remove & Discard patch within 12 hours or as directed by MD 09/22/22   Lockie Mola, MD  losartan (COZAAR) 50 MG tablet Take 50 mg by mouth daily. 01/24/20   [provider]  meloxicam (MOBIC) 15 MG tablet Take 15 mg by mouth daily. 06/22/22   [provider]  metFORMIN (GLUCOPHAGE-XR) 500 MG 24 hr tablet Take 1 tablet (500 mg total) by mouth 2 (two) times daily with a meal. 09/30/22   Lockie Mola, MD  metoprolol tartrate (LOPRESSOR) 25 MG tablet Take 1 tablet (25 mg total) by mouth 2 (two) times daily. 05/13/14   Uvaldo Rising, MD  naloxone [facility])  nasal spray 4 mg/0.1 mL SMARTSIG:Both Nares 12/29/20   [provider]  olopatadine (PATANOL) 0.1 % ophthalmic solution SMARTSIG:1-2 Drop(s) In Eye(s) Twice Daily PRN 10/27/20   [provider]  omeprazole (PRILOSEC) 20 MG capsule Take 2 capsules (40 mg total) by mouth daily. 05/25/19   Meccariello, Solmon Ice, MD  oxyCODONE-acetaminophen (PERCOCET) 10-325 MG tablet Take 1 tablet by mouth 3 (three) times daily. 07/23/19   [provider]  polyethylene glycol powder (GLYCOLAX/MIRALAX) 17 GM/SCOOP powder Take 17 g  by mouth 2 (two) times daily as needed. 09/30/22   Lockie Mola, MD  potassium chloride (KLOR-CON) 10 MEQ tablet Take 10 mEq by mouth 2 (two) times daily. 01/24/20   [provider]  RESTASIS 0.05 % ophthalmic emulsion SMARTSIG:1 Drop(s) In Eye(s) Every 12 Hours 11/03/20   [provider]  Semaglutide, 2 MG/DOSE, 8 MG/3ML SOPN Please inject 2 mg weekly. 06/29/22   Lockie Mola, MD  SYMBICORT 160-4.5 MCG/ACT inhaler Inhale 2 puffs into the lungs in the morning and at bedtime. 12/25/20   [provider]  traZODone (DESYREL) 100 MG tablet TAKE 2 TABLETS BY MOUTH ONCE AT BEDTIME 05/31/19   Meccariello, Solmon Ice, MD  Vitamin D, Ergocalciferol, (DRISDOL) 50000 units CAPS capsule TAKE 1 CAPSULE BY MOUTH EVERY 7 (SEVEN) DAYS. 12/09/17   Meccariello, Solmon Ice, MD  ferrous sulfate 325 (65 FE) MG tablet Take 1 tablet (325 mg total) by mouth 2 (two) times daily with a meal. 02/22/19 04/14/19  Shirley, [place], DO  loratadine (CLARITIN) 10 MG tablet Take 1 tablet (10 mg total) by mouth daily. 04/11/19 08/12/19  Meccariello, Solmon Ice, MD    Family History Family History  Problem Relation Age of Onset   Diabetes Brother    Diabetes Maternal Aunt    Diabetes Paternal Aunt    Diabetes Maternal Grandmother    Diabetes Maternal Grandfather    Diabetes Paternal Grandmother    Diabetes Paternal Grandfather    Congestive Heart Failure Mother    Hypertension Son     Colon cancer Neg Hx    Esophageal cancer Neg Hx    Rectal cancer Neg Hx    Stomach cancer Neg Hx     Social History Social History   Tobacco Use   Smoking status: Never    Passive exposure: Never   Smokeless tobacco: Never  Vaping Use   Vaping status: Never Used  Substance Use Topics   Alcohol use: Yes    Comment: glass of wine occasionally   Drug use: No    Comment: States no longer uses marijuana     Allergies   Soma [carisoprodol], Nortriptyline, and Prednisone   Review of Systems Review of Systems  Constitutional:  Negative for chills and fever.  HENT:  Negative for ear pain and sore throat.   Eyes:  Negative for pain and visual disturbance.  Respiratory:  Negative for cough and shortness of breath.   Cardiovascular:  Negative for chest pain and palpitations.  Gastrointestinal:  Negative for abdominal pain and vomiting.  Genitourinary:  Negative for dysuria and hematuria.  Musculoskeletal:  Positive for arthralgias. Negative for back pain.  Skin:  Negative for color change and rash.  Neurological:  Negative for seizures and syncope.  All other systems reviewed and are negative.    Physical Exam Triage Vital Signs ED Triage Vitals  Encounter Vitals Group     BP 10/29/22 1745 125/83     Systolic BP Percentile --      Diastolic BP Percentile --      Pulse Rate 10/29/22 1745 (!) 54     Resp 10/29/22 1745 16     Temp 10/29/22 1745 98.4 F (36.9 C)     Temp Source 10/29/22 1745 Oral     SpO2 10/29/22 1745 96 %     Weight --      Height --      Head Circumference --      Peak Flow --      Pain Score 10/29/22  1746 8     Pain Loc --      Pain Education --      Exclude from Growth Chart --    No data found.  Updated Vital Signs BP 125/83 (BP Location: Left Arm)   Pulse (!) 54   Temp 98.4 F (36.9 C) (Oral)   Resp 16   SpO2 96%   Visual Acuity Right Eye Distance:   Left Eye Distance:   Bilateral Distance:    Right Eye Near:   Left Eye Near:     Bilateral Near:     Physical Exam Vitals and nursing note reviewed.  Constitutional:      General: She is not in acute distress.    Appearance: She is well-developed.  HENT:     Head: Normocephalic and atraumatic.  Eyes:     Conjunctiva/sclera: Conjunctivae normal.  Cardiovascular:     Rate and Rhythm: Normal rate and regular rhythm.     Heart sounds: No murmur heard. Pulmonary:     Effort: Pulmonary effort is normal. No respiratory distress.     Breath sounds: Normal breath sounds.  Abdominal:     Palpations: Abdomen is soft.     Tenderness: There is no abdominal tenderness.  Musculoskeletal:        General: No swelling.     Cervical back: Neck supple.     Comments: TTP to lateral malleolus and soft tissue below.  No swelling, normal strength, normal ROM.    Skin:    General: Skin is warm and dry.     Capillary Refill: Capillary refill takes less than 2 seconds.  Neurological:     Mental Status: She is alert.  Psychiatric:        Mood and Affect: Mood normal.      UC Treatments / Results  Labs (all labs ordered are listed, but only abnormal results are displayed) Labs Reviewed - No data to display  EKG   Radiology DG Ankle Complete Right  Result Date: 10/29/2022 CLINICAL DATA:  Right ankle pain for several weeks. No known injury. Previous ankle surgery. EXAM: RIGHT ANKLE - COMPLETE 3+ VIEW COMPARISON:  None Available. FINDINGS: There is no evidence of acute fracture, dislocation, or joint effusion. Old fracture deformity is seen involving the distal fibula. Mild degenerative spurring of the ankle seen, without joint space narrowing. Prominent plantar and dorsal calcaneal bone spurs also noted. IMPRESSION: No acute findings. Old fracture deformity of the distal fibula. Mild degenerative spurring of the ankle. Prominent calcaneal bone spurs. Electronically Signed   By: Danae Orleans M.D.   On: 10/29/2022 18:24    Procedures Procedures (including critical care  time)  Medications Ordered in UC Medications - No data to display  Initial Impression / Assessment and Plan / UC Course  I have reviewed the triage vital signs and the nursing notes.  Pertinent labs & imaging results that were available during my care of the patient were reviewed by me and considered in my medical decision making (see chart for details).     Right ankle pain.  No acute findings on xray.  Lace up brace given in clinic today.  Supportive care discussed.  If no improvement advised follow up with ortho.  Final Clinical Impressions(s) / UC Diagnoses   Final diagnoses:  Acute right ankle pain     Discharge Instructions      Can continue with voltaren gel Can wear ankle brace for comfort If no improvement follow up with orthopedics  ED Prescriptions   None    PDMP not reviewed this encounter.   Ward, Tylene Fantasia, PA-C 10/29/22 1835

## 2022-11-01 ENCOUNTER — Other Ambulatory Visit: Payer: Self-pay | Admitting: Family Medicine

## 2022-11-01 DIAGNOSIS — M545 Low back pain, unspecified: Secondary | ICD-10-CM

## 2022-11-12 ENCOUNTER — Encounter: Payer: Self-pay | Admitting: Family Medicine

## 2022-11-12 ENCOUNTER — Other Ambulatory Visit (HOSPITAL_COMMUNITY)
Admission: RE | Admit: 2022-11-12 | Discharge: 2022-11-12 | Disposition: A | Discharge status: Home / Self Care | Payer: 59 | Source: Ambulatory Visit

## 2022-11-12 ENCOUNTER — Ambulatory Visit (INDEPENDENT_AMBULATORY_CARE_PROVIDER_SITE_OTHER): Payer: 59 | Admitting: Family Medicine

## 2022-11-12 VITALS — BP 125/83 | HR 96 | Ht 68.0 in | Wt 225.0 lb

## 2022-11-12 DIAGNOSIS — Z01411 Encounter for gynecological examination (general) (routine) with abnormal findings: Secondary | ICD-10-CM | POA: Diagnosis present

## 2022-11-12 DIAGNOSIS — R1031 Right lower quadrant pain: Secondary | ICD-10-CM | POA: Diagnosis not present

## 2022-11-12 DIAGNOSIS — Z1151 Encounter for screening for human papillomavirus (HPV): Secondary | ICD-10-CM | POA: Insufficient documentation

## 2022-11-12 DIAGNOSIS — N898 Other specified noninflammatory disorders of vagina: Secondary | ICD-10-CM

## 2022-11-12 DIAGNOSIS — R252 Cramp and spasm: Secondary | ICD-10-CM | POA: Diagnosis not present

## 2022-11-12 LAB — POCT WET PREP (WET MOUNT)
Clue Cells Wet Prep Whiff POC: NEGATIVE
Trichomonas Wet Prep HPF POC: ABSENT

## 2022-11-12 MED ORDER — SENNA 8.6 MG PO TABS
1.0000 | ORAL_TABLET | Freq: Every day | ORAL | 0 refills | Status: DC | PRN
Start: 2022-11-12 — End: 2023-10-10

## 2022-11-12 NOTE — Patient Instructions (Addendum)
It was wonderful to see you today.  Please bring ALL of your medications with you to every visit.   Today we talked about:  Pap smear - I will let you know your results when I get them.   For your R sided abdominal pain try miralax 1-2 caps a day plus senna for 5 days. Please try not to take it for longer than this.   Thank you for choosing [facility].   Please call [phone] with any questions about today's appointment.  Lockie Mola, MD  Family Medicine

## 2022-11-12 NOTE — Progress Notes (Signed)
    SUBJECTIVE:   CHIEF COMPLAINT / HPI:   Leg cramps  Patient notes that her leg cramps have improved since last visit.  She started taking iron.  She said the iron that was prescribed to her cost money.  And she had iron 37.5 mg at home.  She started taking 2 tablets a day and says that her leg cramps have greatly improved.  She says that she is able to get sleep at night.  Vaginal Discharge  Patient describes increased vaginal discharge since Saturday, 6 days ago.  No bleeding.  Does endorse right-sided lower quadrant abdominal pain however that abdominal pain had been going on for months.  Discharged just started since Saturday.  No fevers.  No new sexual partners.  RLQ abdominal pain  Patient describes right lower quadrant abdominal pain every morning for the last about 5 months.  She says that this has been new since after her MRI 6 months ago. PERTINENT  PMH / PSH: ***  OBJECTIVE:   BP 125/83   Pulse 96   Ht 5\' 8"  (1.727 m)   Wt 225 lb (102.1 kg)   SpO2 98%   BMI 34.21 kg/m   General: well appearing, in no acute distress CV: RRR, radial pulses equal and palpable, no BLE edema  Resp: Normal work of breathing on room air, CTAB Abd: Soft, non tender, non distended  Neuro: Alert & Oriented x 4    ASSESSMENT/PLAN:   Vaginal discharge -     Cytology - PAP -     POCT Wet Prep [facility])  Right lower quadrant abdominal pain -     Senna; Take 1 tablet (8.6 mg total) by mouth daily as needed for mild constipation.  Dispense: 30 tablet; Refill: 0      Lockie Mola, MD [facility]

## 2022-11-16 NOTE — Assessment & Plan Note (Signed)
Patient had [place] and MRI of pelvis. No mass or fibroid on right side. Pain in the mornings is highly unlikely to be caused by uterine or ovarian pathology. Most likely constipation.  - Trial senna and miralax  - CMP  - F/u in one month

## 2022-11-16 NOTE — Assessment & Plan Note (Signed)
Improved with iron supplementation. Most likely restless leg syndrome.  - Continue iron

## 2022-11-18 ENCOUNTER — Telehealth: Payer: Self-pay | Admitting: Family Medicine

## 2022-11-18 NOTE — Telephone Encounter (Signed)
Left VM saying test results were normal. Patient had given consent to VM at last office visit.

## 2022-12-06 ENCOUNTER — Ambulatory Visit: Payer: 59 | Admitting: Family Medicine

## 2022-12-06 VITALS — BP 118/68 | HR 94 | Wt 220.0 lb

## 2022-12-06 DIAGNOSIS — L720 Epidermal cyst: Secondary | ICD-10-CM

## 2022-12-06 DIAGNOSIS — L723 Sebaceous cyst: Secondary | ICD-10-CM | POA: Diagnosis not present

## 2022-12-06 MED ORDER — DOXYCYCLINE HYCLATE 100 MG PO CAPS
100.0000 mg | ORAL_CAPSULE | Freq: Two times a day (BID) | ORAL | 0 refills | Status: DC
Start: 2022-12-06 — End: 2022-12-15

## 2022-12-06 NOTE — Progress Notes (Signed)
    SUBJECTIVE:   CHIEF COMPLAINT / HPI:   FF is a 62yo F w/ hx of HTN, T2DM, and obesity that p/w bump on L shoulder. - 2wk hx of bump on her L shoulder - then last few days, has gotten bigger, painful, and red - 8/10 pain - Denies any preceding trauma to the area - Denies drainage from the bump, fevers  OBJECTIVE:   BP 118/68   Pulse 94   Wt 220 lb (99.8 kg)   SpO2 98%   BMI 33.45 kg/m   General: Alert, pleasant woman. NAD. HEENT: NCAT. MMM. CV: RRR, no murmurs.  Resp: CTAB, no wheezing or crackles. Normal WOB on RA.  Ext: Moves all ext spontaneously Skin: 3cm indurated, tender, erythematous, fluctuant bump on L posterior shoulder with central dark punctum   ASSESSMENT/PLAN:   Epidermal inclusion cyst 2wk hx of growing mass on L shoulder w/ central dark punctum. POC U/S showed fluid pocket. Most c/w epidermal inclusion cyst.  - Start doxycycline 100mg  BID x7 days - f/u in 1 wk to evaluate. Consider scheduling in Derm clinic for excision     Lincoln Brigham, MD [facility]

## 2022-12-06 NOTE — Patient Instructions (Addendum)
Good to see you today - Thank you for coming in  Things we discussed today:  1) The bump on your shoulder is most likely an infected sebaceous cyst.  - Start taking Doxycycline 100mg  twice a day for 7 days  - You can take ibuprofen and tylenol as needed for pain - Follow-up in 1 week to evaluate the cyst. Depending on how it looks, we may drain it at that time.  Please seek medical attention if: - you start to have fevers of 100.46F or higher - the pain becomes too uncontrolled - the cyst continues to grow

## 2022-12-06 NOTE — Assessment & Plan Note (Signed)
2wk hx of growing mass on L shoulder w/ central dark punctum. POC U/S showed fluid pocket. Most c/w epidermal inclusion cyst.  - Start doxycycline 100mg  BID x7 days - f/u in 1 wk to evaluate. Consider scheduling in Derm clinic for excision

## 2022-12-14 NOTE — Progress Notes (Unsigned)
    SUBJECTIVE:   CHIEF COMPLAINT / HPI:   FF is a 62yo F w/ hx of HTN, fibroid that p/f epidermal inclusion cyst f/u. - Reports that pain is much improved after taking doxy. Only has discomfrot with palpation now. - Denies cyst growing in size. Denies fevers.  OBJECTIVE:   BP 118/70   Pulse 61   Wt 220 lb (99.8 kg)   SpO2 98%   BMI 33.45 kg/m   General: Alert, pleasant well appearing woman. NAD. HEENT: NCAT. MMM. CV: RRR, no murmurs.  Resp: CTAB, no wheezing or crackles. Normal WOB on RA.  Skin: ~3cm, Mildly tender, erythematous, hyperpigmented, fluctuant mass on L posterior shoulder with peeling skin. Improved from my exam on 8/19.   ASSESSMENT/PLAN:   Epidermal inclusion cyst Improving s/p doxycycline x7 days. No systemic symptoms such as fever. - Plan to schedule in Derm clinic for removal of cyst - Doxycycline 100mg  BID x5 days - Tylenol and ibuprofen prn for pain or fever - Return precautions provided.    Lincoln Brigham, MD [facility]

## 2022-12-15 ENCOUNTER — Encounter: Payer: Self-pay | Admitting: Family Medicine

## 2022-12-15 ENCOUNTER — Ambulatory Visit (INDEPENDENT_AMBULATORY_CARE_PROVIDER_SITE_OTHER): Payer: 59 | Admitting: Family Medicine

## 2022-12-15 VITALS — BP 118/70 | HR 61 | Wt 220.0 lb

## 2022-12-15 DIAGNOSIS — L723 Sebaceous cyst: Secondary | ICD-10-CM

## 2022-12-15 DIAGNOSIS — L720 Epidermal cyst: Secondary | ICD-10-CM | POA: Diagnosis not present

## 2022-12-15 MED ORDER — DOXYCYCLINE HYCLATE 100 MG PO CAPS
100.0000 mg | ORAL_CAPSULE | Freq: Two times a day (BID) | ORAL | 0 refills | Status: AC
Start: 2022-12-15 — End: 2022-12-20

## 2022-12-15 NOTE — Assessment & Plan Note (Signed)
Improving s/p doxycycline x7 days. No systemic symptoms such as fever. - Plan to schedule in Derm clinic for removal of cyst - Doxycycline 100mg  BID x5 days - Tylenol and ibuprofen prn for pain or fever - Return precautions provided.

## 2022-12-15 NOTE — Patient Instructions (Signed)
Good to see you today - Thank you for coming in  Things we discussed today:  1) For your cyst - We will schedule you for a removal in our Dermatologic clinic (same place as our normal clinic). Our office will call to schedule - Take Doxycycline 100mg  twice a day for 5 days - Take tylenol or ibuprofen as needed for pain  Please return sooner if: - The cyst is growing again - The pain is worsening and not being controlled by tylenol and ibuprofen - You start to get fevers of 100.53F or higher

## 2022-12-23 ENCOUNTER — Ambulatory Visit (INDEPENDENT_AMBULATORY_CARE_PROVIDER_SITE_OTHER): Payer: 59 | Admitting: Family Medicine

## 2022-12-23 VITALS — BP 100/62 | HR 88 | Ht 68.0 in | Wt 218.0 lb

## 2022-12-23 DIAGNOSIS — L720 Epidermal cyst: Secondary | ICD-10-CM | POA: Diagnosis not present

## 2022-12-23 NOTE — Assessment & Plan Note (Addendum)
Improving with additional doxycycline from last clinic visit. Slight warmth and fluid still appreciated on exam, so suspect wound is still healing. Cyst removal not performed in clinic today. Patient encouraged to finish doxycycline and if she continues to have symptoms to follow up in 3-4 weeks.

## 2022-12-23 NOTE — Patient Instructions (Signed)
It was wonderful to see you today.  Please bring ALL of your medications with you to every visit.   Today we talked about:  Cyst on back: This infection is most likely still healing. Please finish all of your antibiotics and if you continue to have symptoms please follow up in 3-4 weeks. I hope you continue to feel better!   Thank you for choosing [facility].   Please call [phone] with any questions about today's appointment.  Lockie Mola, MD  Family Medicine

## 2022-12-23 NOTE — Progress Notes (Signed)
    SUBJECTIVE:   CHIEF COMPLAINT / HPI:   Norma [name] is a 62 y.o. female presenting for follow-up on an epidermal inclusion cyst.  Per chart review, patient was seen in office on 8/19 and POC U/S showed fluid filled pocket. Patient was prescribed doxycycline 100 mg BID x 7 days. At follow-up on 8/28, cyst appeared to be improving. Doxycycline 100 mg BID was prescribed for an additional 5 days.   Today, patient reports about a month ago having a red bump appear on back that she describes looking like a "mosquito bite." She reports the bump grew and began to feel warm, red and painful. She denies any other lesions.  PERTINENT  PMH / PSH: HTN  OBJECTIVE:   BP 100/62   Pulse 88   Ht 5\' 8"  (1.727 m)   Wt 218 lb (98.9 kg)   SpO2 98%   BMI 33.15 kg/m    General: NAD, pleasant, cooperative Respiratory: Normal work of breathing Skin: 3 cm violaceous, slightly warm, scaly, possibly fluctuant plaque on L upper back  ASSESSMENT/PLAN:   Epidermal inclusion cyst Improving with additional doxycycline from last clinic visit. Slight warmth and fluid still appreciated on exam, so suspect wound is still healing. Cyst removal not performed in clinic today. Patient encouraged to finish doxycycline and if she continues to have symptoms to follow up in 3-4 weeks.    FOLLOW-UP:  Patient to return to clinic if symptoms are not improving in 3-4 weeks.   Bubba Hales, Medical Student [facility]    I was present during key history taking and physical exam and any procedures.  I agree with the note above Pauline Good MD

## 2023-01-06 ENCOUNTER — Ambulatory Visit: Payer: Self-pay | Admitting: Family Medicine

## 2023-01-21 ENCOUNTER — Ambulatory Visit: Payer: 59 | Admitting: Family Medicine

## 2023-02-24 ENCOUNTER — Ambulatory Visit (INDEPENDENT_AMBULATORY_CARE_PROVIDER_SITE_OTHER): Payer: 59 | Admitting: Student

## 2023-02-24 VITALS — BP 144/76 | HR 87 | Ht 68.0 in | Wt 219.2 lb

## 2023-02-24 DIAGNOSIS — R0982 Postnasal drip: Secondary | ICD-10-CM | POA: Diagnosis not present

## 2023-02-24 DIAGNOSIS — J309 Allergic rhinitis, unspecified: Secondary | ICD-10-CM | POA: Diagnosis not present

## 2023-02-24 DIAGNOSIS — I10 Essential (primary) hypertension: Secondary | ICD-10-CM | POA: Diagnosis not present

## 2023-02-24 MED ORDER — AZELASTINE HCL 0.1 % NA SOLN
2.0000 | Freq: Two times a day (BID) | NASAL | 12 refills | Status: DC
Start: 2023-02-24 — End: 2023-08-01

## 2023-02-24 NOTE — Patient Instructions (Signed)
I think your symptoms are related to your chronic rhinorrhea (runny nose) getting into the back of your throat. I'd like you to try two nasal sprays: Flonase and Azelastine. Use both of these twice daily until things calm down.  IF this doesn't help or you start having fevers, please send me a MyChart message.  Eliezer Mccoy, MD

## 2023-02-24 NOTE — Progress Notes (Signed)
    SUBJECTIVE:   CHIEF COMPLAINT / HPI:   Cough Dry cough, worse at night.  Present for several months now, perhaps a bit worse over the past week or so.  The past weeks worsening in symptoms has been accompanied by worsening congestion and rhinorrhea.  She does have chronic allergies.  Has taken Zyrtec on and off, but not daily.  Also has Flonase on her medication list but not using this with any regularity.  Denies any chest pain, shortness of breath.  Feels that the cough starts in her upper airway rather than her chest, not coughing up any appreciable amount of sputum.  No fevers.  HTN Forgot to take her blood pressure medicine this morning.  PERTINENT  PMH / PSH: HTN, GERD, T2DM,   OBJECTIVE:   BP (!) 144/76   Pulse 87   Ht 5\' 8"  (1.727 m)   Wt 219 lb 3.2 oz (99.4 kg)   SpO2 100%   BMI 33.33 kg/m  Gen: Well-appearing, NAD HENT: Conjunctivae are clear, nares are pale and boggy, there is scant rhinorrhea present.  The oropharynx is erythematous but without tonsillar hypertrophy or exudate. Neck: Without cervical lymphadenopathy Cardio: Regular rate and rhythm Pulm: Normal work of breathing on room air, lungs are clear to auscultation in all fields with good air movement  ASSESSMENT/PLAN:   Allergic rhinitis with postnasal drip Symptoms are suggestive of upper airway cough syndrome secondary to postnasal drip.  Discussed the utility of nasal sprays in addressing this.  Also discussed proper use of nasal sprays.   -She can continue daily Zyrtec and we will add daily Flonase and azelastine to her regimen.   -Anticipate fairly rapid improvement with these changes.   -Return precautions reviewed for difficulty breathing, development of new fever.  HYPERTENSION, BENIGN SYSTEMIC BP above goal x 2.  Likely in the setting of having not taken her blood pressure medicines today.   -Advised to continue her losartan 50 mg daily and hydrochlorothiazide 25 mg daily     J Dorothyann Gibbs, MD [facility]

## 2023-02-25 NOTE — Assessment & Plan Note (Signed)
Symptoms are suggestive of upper airway cough syndrome secondary to postnasal drip.  Discussed the utility of nasal sprays in addressing this.  Also discussed proper use of nasal sprays.   -She can continue daily Zyrtec and we will add daily Flonase and azelastine to her regimen.   -Anticipate fairly rapid improvement with these changes.   -Return precautions reviewed for difficulty breathing, development of new fever.

## 2023-02-25 NOTE — Assessment & Plan Note (Signed)
BP above goal x 2.  Likely in the setting of having not taken her blood pressure medicines today.   -Advised to continue her losartan 50 mg daily and hydrochlorothiazide 25 mg daily

## 2023-03-30 ENCOUNTER — Other Ambulatory Visit: Payer: Self-pay | Admitting: Family Medicine

## 2023-03-30 DIAGNOSIS — M545 Low back pain, unspecified: Secondary | ICD-10-CM

## 2023-04-07 ENCOUNTER — Ambulatory Visit: Payer: 59

## 2023-04-07 NOTE — Progress Notes (Deleted)
  SUBJECTIVE:   CHIEF COMPLAINT / HPI:   Leg cramps with left foot numbness:  PERTINENT  PMH / PSH: HTN,T2DM, depression, iron deficiency anemia  OBJECTIVE:  There were no vitals taken for this visit. Physical Exam   ASSESSMENT/PLAN:   Assessment & Plan  No follow-ups on file. [name], DO 04/07/2023, 9:29 AM PGY-3, [facility] {    This will disappear when note is signed, click to select method of visit    :1}

## 2023-04-08 ENCOUNTER — Ambulatory Visit (INDEPENDENT_AMBULATORY_CARE_PROVIDER_SITE_OTHER): Payer: 59 | Admitting: Student

## 2023-04-08 DIAGNOSIS — R252 Cramp and spasm: Secondary | ICD-10-CM | POA: Diagnosis not present

## 2023-04-08 MED ORDER — FERROUS GLUCONATE 324 (38 FE) MG PO TABS
324.0000 mg | ORAL_TABLET | Freq: Every day | ORAL | 1 refills | Status: AC
Start: 2023-04-08 — End: ?

## 2023-04-08 NOTE — Patient Instructions (Addendum)
Pleasure to see you today.  Suspect your cramps might be due to dehydration or low iron  I recommend drink more water at least 60-80oz a day over juice.  I have refilled your iron supplements.  Make sure you are taking the right dose at this time.  You can continue to do warm compress/cold compress over the leg.

## 2023-04-08 NOTE — Progress Notes (Signed)
    SUBJECTIVE:   CHIEF COMPLAINT / HPI: Leg cramps  62 year old female with hx of well controlled DM, HTN and low Ferritin Presenting today for increased frequency of lower extremity cramps Says she gets this about 4 times throughout the night and they interrupt her sleep Cramps are mostly on her right leg and more common at night Had diffused cramps in the past that improved on iron supplements. Symptoms started becoming more frequent after she ran out of her iron supplements Endorses poor hydration as she mostly drink cranberry juice Currently on Gabapentin 800 BID   PERTINENT  PMH / PSH: Reviewed   OBJECTIVE:   BP 138/84   Pulse 90   Ht 5\' 8"  (1.727 m)   Wt 216 lb 6.4 oz (98.2 kg)   SpO2 100%   BMI 32.90 kg/m    Physical Exam General: Alert, well appearing, NAD, Oriented x4 Cardiovascular: RRR, No Murmurs, Normal S2/S2 Respiratory: CTAB, No wheezing or Rales Abdomen: No distension or tenderness Extremities: No edema on extremities, no tenderness of calf muscle    ASSESSMENT/PLAN:   Leg cramps This has been a chronic issue for patient. Suspect possible dehydration or known iron deficiency giving low ferritin in the past. Patient also reported improvement of symptom with iron supplements in the past.  Also noticed increased frequency since running out of her iron supplements. -Recommend hydration with goal of 60-80 ounces a day -Refilled patient's Iron supplement  -Continue Gabapentin  -Advised use of warm compress QS -Will consider obtaining labs (BMP, ferritin) if cramps continue -Could consider increasing Gabapentin dose     Jerre Simon, MD [facility]

## 2023-04-08 NOTE — Assessment & Plan Note (Addendum)
This has been a chronic issue for patient. Suspect possible dehydration or known iron deficiency giving low ferritin in the past. Patient also reported improvement of symptom with iron supplements in the past.  Also noticed increased frequency since running out of her iron supplements. -Recommend hydration with goal of 60-80 ounces a day -Refilled patient's Iron supplement  -Continue Gabapentin  -Advised use of warm compress QS -Will consider obtaining labs (BMP, ferritin) if cramps continue -Could consider increasing Gabapentin dose

## 2023-04-27 ENCOUNTER — Other Ambulatory Visit: Payer: Self-pay

## 2023-04-27 ENCOUNTER — Emergency Department (HOSPITAL_BASED_OUTPATIENT_CLINIC_OR_DEPARTMENT_OTHER)
Admission: EM | Admit: 2023-04-27 | Discharge: 2023-04-27 | Disposition: A | Discharge status: Home / Self Care | Payer: 59 | Attending: Emergency Medicine | Admitting: Emergency Medicine

## 2023-04-27 ENCOUNTER — Encounter (HOSPITAL_BASED_OUTPATIENT_CLINIC_OR_DEPARTMENT_OTHER): Payer: Self-pay

## 2023-04-27 DIAGNOSIS — Z79899 Other long term (current) drug therapy: Secondary | ICD-10-CM | POA: Insufficient documentation

## 2023-04-27 DIAGNOSIS — I10 Essential (primary) hypertension: Secondary | ICD-10-CM | POA: Diagnosis not present

## 2023-04-27 DIAGNOSIS — M545 Low back pain, unspecified: Secondary | ICD-10-CM | POA: Diagnosis present

## 2023-04-27 DIAGNOSIS — Z7984 Long term (current) use of oral hypoglycemic drugs: Secondary | ICD-10-CM | POA: Diagnosis not present

## 2023-04-27 DIAGNOSIS — G8929 Other chronic pain: Secondary | ICD-10-CM | POA: Insufficient documentation

## 2023-04-27 DIAGNOSIS — E119 Type 2 diabetes mellitus without complications: Secondary | ICD-10-CM | POA: Diagnosis not present

## 2023-04-27 MED ORDER — PREDNISONE 20 MG PO TABS: 40.0000 mg | ORAL_TABLET | Freq: Every day | ORAL | 0 refills | Status: DC

## 2023-04-27 MED ORDER — FENTANYL CITRATE PF 50 MCG/ML IJ SOSY
100.0000 ug | PREFILLED_SYRINGE | Freq: Once | INTRAMUSCULAR | Status: AC
  Administered 2023-04-27: 100 ug via INTRAMUSCULAR
  Filled 2023-04-27: qty 2

## 2023-04-27 NOTE — Discharge Instructions (Signed)
 Continue your home pain medicine as prescribed, you can take the short burst of steroid in addition to help with the acute back pain and inflammation.  Please follow-up closely with your primary care doctor.  Please return if you have significant worsening of your back pain especially if you have numbness or tingling in your groin, loss of control of your bladder or bowels.  Or complete inability to walk secondary to weakness from your back pain.

## 2023-04-27 NOTE — ED Notes (Signed)
 RN reviewed discharge instructions with pt. Pt verbalized understanding and had no further questions. VSS upon discharge.

## 2023-04-27 NOTE — ED Triage Notes (Signed)
 Pt denies any urinary complaints/symptoms. Pt able to ambulate independently with difficulty.

## 2023-04-27 NOTE — ED Provider Notes (Signed)
 [place] EMERGENCY DEPARTMENT AT [facility] Provider Note   CSN: [id number] Arrival date & time: 04/27/23  2045     History  Chief Complaint  Patient presents with   Back Pain    Norma [name] is a 63 y.o. female medical history seen for hypertension, borderline diabetes, chronic back pain, previous lumbar laminectomy/decompression microdiscectomy presents concern for acute on chronic right lower back pain beginning yesterday with worsening today.  Patient reports that she has some chronic pain, took her home lidocaine  patch, Percocet, meloxicam , and cyclobenzaprine  without relief.  She reports that feels somewhat like her normal back pain but not relieved by her home medications.  She denies any recent injury, or new lifting.  No history of IV drug use, chronic steroid use, cancer.  She denies any loss of control of bladder, bowels, new numbness, tingling.  She is ambulatory with some pain.   Back Pain      Home Medications Prior to Admission medications   Medication Sig Start Date End Date Taking? Authorizing Provider  albuterol (VENTOLIN HFA) 108 (90 Base) MCG/ACT inhaler SMARTSIG:1-2 Puff(s) Via Inhaler Every 4-6 Hours PRN 08/25/20   [provider]  ALPRAZolam (XANAX) 0.25 MG tablet Take 0.25 mg by mouth 3 (three) times daily as needed for anxiety. 07/14/22   [provider]  atorvastatin  (LIPITOR) 10 MG tablet Take 1 tablet (10 mg total) by mouth daily. 11/27/21   Ganta, Anupa, DO  azelastine  (ASTELIN ) 0.1 % nasal spray Place 2 sprays into both nostrils 2 (two) times daily. Use in each nostril as directed 02/24/23   Marlee Lynwood KATHEE, MD  Blood Glucose Monitoring Suppl (ONE TOUCH ULTRA 2) w/Device KIT Use as directed 08/05/22   McDiarmid, Krystal BIRCH, MD  buPROPion  (WELLBUTRIN  XL) 300 MG 24 hr tablet Take 300 mg by mouth daily.    [provider]  cetirizine  (ZYRTEC  ALLERGY) 10 MG tablet Take 1 tablet (10 mg total) by mouth daily. 11/07/20   Graham,  Laura E, PA-C  cyclobenzaprine  (FLEXERIL ) 10 MG tablet Take 10 mg by mouth 4 (four) times daily as needed for muscle spasms. 07/14/22   [provider]  diclofenac  Sodium (VOLTAREN ) 1 % GEL Please apply 1-2 times daily to affected area 10/09/19   Delane Lye, MD  dicyclomine  (BENTYL ) 10 MG capsule Take 10 mg by mouth 3 (three) times daily as needed for spasms. 07/13/22   [provider]  EPINEPHrine  0.3 mg/0.3 mL IJ SOAJ injection Inject into the muscle as directed. 07/22/20   [provider]  ferrous gluconate  (FERGON) 324 MG tablet Take 1 tablet (324 mg total) by mouth daily with breakfast. 04/08/23   Rosendo Rush, MD  fluticasone  (FLONASE ) 50 MCG/ACT nasal spray Place 2 sprays into both nostrils daily. 10/15/19   Meccariello, Con PARAS, MD  gabapentin  (NEURONTIN ) 800 MG tablet Take 1 tablet (800 mg total) by mouth 4 (four) times daily. Patient taking differently: Take 800 mg by mouth 2 (two) times daily. 02/19/22     hydrochlorothiazide  (HYDRODIURIL ) 25 MG tablet Take 1 tablet (25 mg total) by mouth daily. 11/02/16   Lanny Maxwell, MD  Lancets [facility] DELICA PLUS [place]) MISC Use as directed 06/29/22   [provider]  lidocaine  (LIDODERM ) 5 % APPLY ONE PATCH ONTO THE SKIN EVERY DAY. REMOVE AND DISCARD PATVH WITH IN 12 HOURS 04/03/23   Nicholas Bar, MD  losartan  (COZAAR ) 50 MG tablet Take 50 mg by mouth daily. 01/24/20   [provider]  meloxicam  (  MOBIC ) 15 MG tablet Take 15 mg by mouth daily. 06/22/22   [provider]  metFORMIN  (GLUCOPHAGE -XR) 500 MG 24 hr tablet Take 1 tablet (500 mg total) by mouth 2 (two) times daily with a meal. 09/30/22   Nicholas Bar, MD  metoprolol  tartrate (LOPRESSOR ) 25 MG tablet Take 1 tablet (25 mg total) by mouth 2 (two) times daily. 05/13/14   Cosme Rockey PARAS, MD  naloxone [facility]) nasal spray 4 mg/0.1 mL SMARTSIG:Both Nares 12/29/20   [provider]  olopatadine (PATANOL) 0.1 % ophthalmic solution  SMARTSIG:1-2 Drop(s) In Eye(s) Twice Daily PRN 10/27/20   [provider]  omeprazole  (PRILOSEC) 20 MG capsule Take 2 capsules (40 mg total) by mouth daily. 05/25/19   Meccariello, Con PARAS, MD  oxyCODONE -acetaminophen  (PERCOCET) 10-325 MG tablet Take 1 tablet by mouth 3 (three) times daily. 07/23/19   [provider]  polyethylene glycol powder (GLYCOLAX /MIRALAX ) 17 GM/SCOOP powder Take 17 g by mouth 2 (two) times daily as needed. 09/30/22   Nicholas Bar, MD  potassium chloride  (KLOR-CON ) 10 MEQ tablet Take 10 mEq by mouth 2 (two) times daily. 01/24/20   [provider]  RESTASIS 0.05 % ophthalmic emulsion SMARTSIG:1 Drop(s) In Eye(s) Every 12 Hours 11/03/20   [provider]  Semaglutide , 2 MG/DOSE, 8 MG/3ML SOPN Please inject 2 mg weekly. 06/29/22   Nicholas Bar, MD  senna (SENOKOT) 8.6 MG TABS tablet Take 1 tablet (8.6 mg total) by mouth daily as needed for mild constipation. 11/12/22   Nicholas Bar, MD  SYMBICORT 160-4.5 MCG/ACT inhaler Inhale 2 puffs into the lungs in the morning and at bedtime. 12/25/20   [provider]  traZODone  (DESYREL ) 100 MG tablet TAKE 2 TABLETS BY MOUTH ONCE AT BEDTIME 05/31/19   Meccariello, Con PARAS, MD  Vitamin D , Ergocalciferol , (DRISDOL ) 50000 units CAPS capsule TAKE 1 CAPSULE BY MOUTH EVERY 7 (SEVEN) DAYS. 12/09/17   Meccariello, Con PARAS, MD  ferrous sulfate  325 (65 FE) MG tablet Take 1 tablet (325 mg total) by mouth 2 (two) times daily with a meal. 02/22/19 04/14/19  Shirley, Jordan, DO  loratadine  (CLARITIN ) 10 MG tablet Take 1 tablet (10 mg total) by mouth daily. 04/11/19 08/12/19  Meccariello, Con PARAS, MD      Allergies    Soma  [carisoprodol ], Nortriptyline, and Prednisone     Review of Systems   Review of Systems  Musculoskeletal:  Positive for back pain.  All other systems reviewed and are negative.   Physical Exam Updated Vital Signs BP 121/80 (BP Location: Right Arm)   Pulse 89   Temp 98.4 F (36.9 C)  (Oral)   Resp 20   Ht 5' 8 (1.727 m)   Wt 99.3 kg   SpO2 100%   BMI 33.30 kg/m  Physical Exam Vitals and nursing note reviewed.  Constitutional:      General: She is not in acute distress.    Appearance: Normal appearance.  HENT:     Head: Normocephalic and atraumatic.  Eyes:     General:        Right eye: No discharge.        Left eye: No discharge.  Cardiovascular:     Rate and Rhythm: Normal rate and regular rhythm.     Heart sounds: No murmur heard.    No friction rub. No gallop.  Pulmonary:     Effort: Pulmonary effort is normal.     Breath sounds: Normal breath sounds.  Abdominal:     General: Bowel sounds  are normal.     Palpations: Abdomen is soft.  Musculoskeletal:     Comments: Intact strength 5/5 of bilateral lower extremities, positive straight leg raise on right.  She is not tenderness to palpation in the right lumbar paraspinous muscles.  Skin:    General: Skin is warm and dry.     Capillary Refill: Capillary refill takes less than 2 seconds.  Neurological:     Mental Status: She is alert and oriented to person, place, and time.  Psychiatric:        Mood and Affect: Mood normal.        Behavior: Behavior normal.     ED Results / Procedures / Treatments   Labs (all labs ordered are listed, but only abnormal results are displayed) Labs Reviewed - No data to display  EKG None  Radiology No results found.  Procedures Procedures    Medications Ordered in ED Medications - No data to display  ED Course/ Medical Decision Making/ A&P                                 Medical Decision Making  Patient with back pain.  My emergent differential diagnosis includes slipped disc, compression fracture, spondylolisthesis, less clinical concern for epidural abscess or osteomyelitis based on patient history.  Overall with high clinical suspicion for lumbar sprain, sciatica based on clinical presentation, risk factors.  She does have history of previous  laminectomy, but overall with low clinical suspicion for surgical complication with no recent surgery, no midline spinal tenderness, no focal neurologic deficits.  No neurological deficits. Patient is ambulatory. No warning symptoms of back pain including: fecal incontinence, urinary retention or overflow incontinence, night sweats, waking from sleep with back pain, unexplained fevers or weight loss, h/o cancer, IVDU, recent trauma. Overall low clinical concern for cauda equina, epidural abscess, or other serious cause of back pain.  Given this work-up, evaluation, physical exam I do not believe that radiographic imaging is indicated at this time.  Encouraged continuing home meloxicam , cyclobenzaprine , Tylenol , will order prednisone  burst, and 100 mcg of fentanyl  in the ED IM. No focal neurologic deficits noted on exam and patient is able to ambulate with some pain prior to discharge. extensive return precautions given, patient discharged in stable condition at this time.  Final Clinical Impression(s) / ED Diagnoses Final diagnoses:  None    Rx / DC Orders ED Discharge Orders     None         Rosan Sherlean VEAR DEVONNA 04/27/23 2318    Jerral Meth, MD 04/27/23 2334

## 2023-04-27 NOTE — ED Triage Notes (Signed)
 Pt brought in by EMS with complaint of right lower back pain beginning yesterday. Pain worsened today after turning in chair. Lidocaine  patch, 10mg  percocet taken around 4pm, no relief. Pain is like her normal back pain but is usually relieved by home medications. Hx of pinched nerve and back surgery 7 years ago. No recent falls or injuries, no lifting.

## 2023-05-06 ENCOUNTER — Other Ambulatory Visit: Payer: Self-pay | Admitting: Family Medicine

## 2023-05-06 DIAGNOSIS — M545 Low back pain, unspecified: Secondary | ICD-10-CM

## 2023-05-13 ENCOUNTER — Other Ambulatory Visit: Payer: Self-pay | Admitting: *Deleted

## 2023-05-13 DIAGNOSIS — M545 Low back pain, unspecified: Secondary | ICD-10-CM

## 2023-05-17 ENCOUNTER — Ambulatory Visit (INDEPENDENT_AMBULATORY_CARE_PROVIDER_SITE_OTHER): Payer: 59 | Admitting: Student

## 2023-05-17 VITALS — BP 112/70 | HR 96 | Ht 68.0 in | Wt 225.8 lb

## 2023-05-17 DIAGNOSIS — R7989 Other specified abnormal findings of blood chemistry: Secondary | ICD-10-CM | POA: Diagnosis not present

## 2023-05-17 DIAGNOSIS — M79672 Pain in left foot: Secondary | ICD-10-CM | POA: Diagnosis not present

## 2023-05-17 NOTE — Patient Instructions (Signed)
It was great to see you! Thank you for allowing me to participate in your care!  Our plans for today:  - Can try capsaicin cream over the counter or continue with Voltaren gel 4 times a day -  We will check kidney function today and then discuss gabapentin dosing   We are checking some labs today, I will call you if they are abnormal will send you a MyChart message or a letter if they are normal.  If you do not hear about your labs in the next 2 weeks please let us know.  Take care and seek immediate care sooner if you develop any concerns.   Dr. Erick Alley, DO [facility]

## 2023-05-17 NOTE — Progress Notes (Signed)
    SUBJECTIVE:   CHIEF COMPLAINT / HPI:   L foot numbness for years but for past 3 days is having numbness and  aching pain in all toes of L foot. Says it is worse when she puts pressure on the foot but also states it does not hurt with walking. Also hurts lying in bed when the cover rubs over toes. Has been applying voltaren gel which helps. Also takes gabapentin 800 mg BID. Was wearing heavy shoes last week but wearing her sketchers tennis shoes today.   PERTINENT  PMH / PSH: T2DM, HTN,   OBJECTIVE:   BP 112/70   Pulse 96   Ht 5\' 8"  (1.727 m)   Wt 225 lb 12.8 oz (102.4 kg)   SpO2 98%   BMI 34.33 kg/m    General: NAD, pleasant, able to participate in exam Cardiac: Well-perfused Respiratory: Breathing comfortably on room air Extremities: no edema or cyanosis.  Palpable dorsalis pedis and posterior tibial pulses, no deformities or wounds of feet, sensation intact bilaterally with monofilament testing.  No pain with palpation of dorsum of left foot or toes Skin: warm and dry Neuro: alert, no obvious focal deficits Psych: Normal affect and mood  ASSESSMENT/PLAN:   Left foot pain Has known history of neuropathy, currently taking gabapentin 800 mg twice daily.  I suspect this is likely neuropathic pain given allodynia when bedsheets rub over her toes.  Currently no pain in the office and foot exam is reassuring.  Can also consider fibromyalgia or chronic pain syndrome.  Also considered shingles however there are no vesicles.  -Can trial capsaicin cream or continue with Voltaren gel 4 times a day -Can consider altering gabapentin regimen however due to elevated cr, will recheck kidney function first   Elevated serum creatinine elevated creatinine on chart review in 09/2022 to 1.1 (BL ~ 0.8-0.9), will recheck today with BMP     Dr. Erick Alley, DO [place] [facility]

## 2023-05-17 NOTE — Assessment & Plan Note (Signed)
elevated creatinine on chart review in 09/2022 to 1.1 (BL ~ 0.8-0.9), will recheck today with BMP

## 2023-05-17 NOTE — Assessment & Plan Note (Signed)
Has known history of neuropathy, currently taking gabapentin 800 mg twice daily.  I suspect this is likely neuropathic pain given allodynia when bedsheets rub over her toes.  Currently no pain in the office and foot exam is reassuring.  Can also consider fibromyalgia or chronic pain syndrome.  Also considered shingles however there are no vesicles.  -Can trial capsaicin cream or continue with Voltaren gel 4 times a day -Can consider altering gabapentin regimen however due to elevated cr, will recheck kidney function first

## 2023-05-18 ENCOUNTER — Encounter: Payer: Self-pay | Admitting: Student

## 2023-05-18 LAB — BASIC METABOLIC PANEL
BUN/Creatinine Ratio: 13 (ref 12–28)
BUN: 11 mg/dL (ref 8–27)
CO2: 25 mmol/L (ref 20–29)
Calcium: 8.9 mg/dL (ref 8.7–10.3)
Chloride: 105 mmol/L (ref 96–106)
Creatinine, Ser: 0.87 mg/dL (ref 0.57–1.00)
Glucose: 104 mg/dL — ABNORMAL HIGH (ref 70–99)
Potassium: 3.8 mmol/L (ref 3.5–5.2)
Sodium: 142 mmol/L (ref 134–144)
eGFR: 74 mL/min/{1.73_m2} (ref >59)

## 2023-05-19 ENCOUNTER — Telehealth: Payer: Self-pay

## 2023-05-19 NOTE — Telephone Encounter (Signed)
Patient LVM on nurse line in regards to results.   She reports she was alerted she had a mychart message about test results, however she can not log in.   I attempted to call patient with results, however no answer.   Will await her return call.

## 2023-05-30 ENCOUNTER — Other Ambulatory Visit: Payer: Self-pay | Admitting: Family Medicine

## 2023-05-30 DIAGNOSIS — E119 Type 2 diabetes mellitus without complications: Secondary | ICD-10-CM

## 2023-06-07 ENCOUNTER — Other Ambulatory Visit: Payer: Self-pay | Admitting: *Deleted

## 2023-06-07 DIAGNOSIS — M545 Low back pain, unspecified: Secondary | ICD-10-CM

## 2023-06-14 ENCOUNTER — Ambulatory Visit: Payer: 59 | Admitting: Student

## 2023-06-14 MED ORDER — LIDOCAINE 5 % EX PTCH: 1.0000 | MEDICATED_PATCH | CUTANEOUS | 0 refills | Status: DC

## 2023-06-14 NOTE — Progress Notes (Deleted)

## 2023-07-04 ENCOUNTER — Other Ambulatory Visit: Payer: Self-pay | Admitting: Family Medicine

## 2023-07-04 DIAGNOSIS — M545 Low back pain, unspecified: Secondary | ICD-10-CM

## 2023-08-01 ENCOUNTER — Ambulatory Visit: Admitting: Family Medicine

## 2023-08-01 ENCOUNTER — Encounter: Payer: Self-pay | Admitting: Family Medicine

## 2023-08-01 DIAGNOSIS — J309 Allergic rhinitis, unspecified: Secondary | ICD-10-CM

## 2023-08-01 DIAGNOSIS — R0982 Postnasal drip: Secondary | ICD-10-CM | POA: Diagnosis not present

## 2023-08-01 MED ORDER — AZELASTINE HCL 0.1 % NA SOLN
2.0000 | Freq: Two times a day (BID) | NASAL | 12 refills | Status: AC
Start: 2023-08-01 — End: ?

## 2023-08-01 MED ORDER — FLUTICASONE PROPIONATE 50 MCG/ACT NA SUSP
2.0000 | Freq: Every day | NASAL | 6 refills | Status: AC
Start: 2023-08-01 — End: ?

## 2023-08-01 NOTE — Assessment & Plan Note (Signed)
 Suspect nasal discharge is secondary to allergic rhinitis.  Continue daily Zyrtec.  Try Flonase 1 puff in each nare daily, also sent azelastine nasal spray to add on if needed - Advised to return if symptoms do not improve

## 2023-08-01 NOTE — Patient Instructions (Signed)
 Good to see you today - Thank you for coming in  Things we discussed today: Use Flonase 2 sprays each side of your nose for one week then decrease it to 1 spray in each side every day after that.  I sent in azelastine nasal spray as well, it is an antihistamine nose spray if the Flonase does not agree with you  Please fill out record form requests for [facility]

## 2023-08-01 NOTE — Progress Notes (Signed)
    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  Continue daily Zyrtec.  Try Flonase 1 puff in each nare daily, also sent azelastine nasal spray to add on if needed - Advised to return if symptoms do not improve     Norma Cumins, DO [place] [facility]

## 2023-08-09 ENCOUNTER — Other Ambulatory Visit: Payer: Self-pay | Admitting: Family Medicine

## 2023-08-09 DIAGNOSIS — M545 Low back pain, unspecified: Secondary | ICD-10-CM

## 2023-08-12 ENCOUNTER — Other Ambulatory Visit: Payer: Self-pay | Admitting: Orthopedic Surgery

## 2023-08-12 DIAGNOSIS — G8929 Other chronic pain: Secondary | ICD-10-CM

## 2023-08-23 ENCOUNTER — Ambulatory Visit
Admission: RE | Admit: 2023-08-23 | Discharge: 2023-08-23 | Discharge status: Home / Self Care | Source: Ambulatory Visit | Attending: Orthopedic Surgery | Admitting: Orthopedic Surgery

## 2023-08-23 ENCOUNTER — Ambulatory Visit
Admission: RE | Admit: 2023-08-23 | Discharge: 2023-08-23 | Disposition: A | Discharge status: Home / Self Care | Source: Ambulatory Visit | Attending: Orthopedic Surgery | Admitting: Orthopedic Surgery

## 2023-08-23 DIAGNOSIS — G8929 Other chronic pain: Secondary | ICD-10-CM

## 2023-08-23 MED ORDER — IOPAMIDOL (ISOVUE-M 200) INJECTION 41%
12.0000 mL | Freq: Once | INTRAMUSCULAR | Status: AC
  Administered 2023-08-23: 12 mL via INTRA_ARTICULAR

## 2023-08-30 ENCOUNTER — Other Ambulatory Visit: Payer: Self-pay | Admitting: Family Medicine

## 2023-08-30 ENCOUNTER — Other Ambulatory Visit

## 2023-08-30 DIAGNOSIS — M545 Low back pain, unspecified: Secondary | ICD-10-CM

## 2023-09-16 ENCOUNTER — Other Ambulatory Visit: Payer: Self-pay | Admitting: Orthopedic Surgery

## 2023-09-30 ENCOUNTER — Other Ambulatory Visit: Payer: Self-pay

## 2023-09-30 DIAGNOSIS — R252 Cramp and spasm: Secondary | ICD-10-CM

## 2023-10-01 MED ORDER — ATORVASTATIN CALCIUM 10 MG PO TABS: 10.0000 mg | ORAL_TABLET | Freq: Every day | ORAL | 3 refills | Status: AC

## 2023-10-01 NOTE — Telephone Encounter (Signed)
 Will refill statin for now. Patient needs an updated appointment for chronic condition follow up. Admin team: please schedule this with PCP. Thank you!

## 2023-10-04 ENCOUNTER — Other Ambulatory Visit: Payer: Self-pay | Admitting: Nurse Practitioner

## 2023-10-04 DIAGNOSIS — Z1231 Encounter for screening mammogram for malignant neoplasm of breast: Secondary | ICD-10-CM

## 2023-10-05 ENCOUNTER — Ambulatory Visit: Admitting: Family Medicine

## 2023-10-12 ENCOUNTER — Encounter (HOSPITAL_COMMUNITY)
Admission: RE | Admit: 2023-10-12 | Discharge: 2023-10-12 | Disposition: A | Discharge status: Home / Self Care | Source: Ambulatory Visit | Attending: Orthopedic Surgery | Admitting: Orthopedic Surgery

## 2023-10-12 ENCOUNTER — Other Ambulatory Visit: Payer: Self-pay

## 2023-10-12 ENCOUNTER — Encounter (HOSPITAL_COMMUNITY): Payer: Self-pay

## 2023-10-12 VITALS — BP 137/91 | HR 99 | Temp 98.7°F | Resp 16 | Ht 67.0 in | Wt 214.0 lb

## 2023-10-12 DIAGNOSIS — Z01818 Encounter for other preprocedural examination: Secondary | ICD-10-CM | POA: Insufficient documentation

## 2023-10-12 DIAGNOSIS — R000 Tachycardia, unspecified: Secondary | ICD-10-CM | POA: Diagnosis not present

## 2023-10-12 DIAGNOSIS — E119 Type 2 diabetes mellitus without complications: Secondary | ICD-10-CM | POA: Insufficient documentation

## 2023-10-12 HISTORY — DX: Fatty (change of) liver, not elsewhere classified: K76.0

## 2023-10-12 LAB — SURGICAL PCR SCREEN
MRSA, PCR: NEGATIVE
Staphylococcus aureus: NEGATIVE

## 2023-10-12 NOTE — Progress Notes (Addendum)
 Patient states she had lab work including liver function. Was done last week at [facility]. Requested lab work. -Addendum, labs from 10/04/23 in media tab  COVID Vaccine Completed: yes  Date of COVID positive in last 90 days:  PCP - Vernell Pass, NP LOV in media tab dated 10/13/23 Cardiologist - Dr. Claudene  Chest x-ray - requested from [facility] Received CXR from 2019, will need to be done DOS EKG - 10/12/23 Epic/chart Stress Test - 09/23/12 Epic ECHO - with cards Cardiac Cath - n/a Pacemaker/ICD device last checked: n/a Spinal Cord Stimulator: n/a  Bowel Prep - no  Sleep Study - n/a CPAP -   Fasting Blood Sugar - pre DM Checks Blood Sugar  no checks at home  Last dose of GLP1 agonist-  Ozempic  GLP1 instructions:  Do not take after 10/12/23    Last dose of SGLT-2 inhibitors-  N/A SGLT-2 instructions:  Do not take after     Blood Thinner Instructions:  Last dose: n/a  Time: Aspirin  Instructions: Last Dose:  Activity level: Can go up a flight of stairs and perform activities of daily living without stopping and without symptoms of chest pain or shortness of breath.  Anesthesia review: heart murmur, HTN, DM2, a fib, anemia, hep C carrier, tachycardic at 107 on EKG  Patient denies shortness of breath, fever, cough and chest pain at PAT appointment  Patient verbalized understanding of instructions that were given to them at the PAT appointment. Patient was also instructed that they will need to review over the PAT instructions again at home before surgery.

## 2023-10-12 NOTE — Patient Instructions (Addendum)
 SURGICAL WAITING ROOM VISITATION  Patients having surgery or a procedure may have no more than 2 support people in the waiting area - these visitors may rotate.    Children under the age of 28 must have an adult with them who is not the patient.  Visitors with respiratory illnesses are discouraged from visiting and should remain at home.  If the patient needs to stay at the hospital during part of their recovery, the visitor guidelines for inpatient rooms apply. Pre-op nurse will coordinate an appropriate time for 1 support person to accompany patient in pre-op.  This support person may not rotate.    Please refer to the [facility] website for the visitor guidelines for Inpatients (after your surgery is over and you are in a regular room).    Your procedure is scheduled on: 10/20/23   Report to [facility] Main Entrance    Report to admitting at 5:15 AM   Call this number if you have problems the morning of surgery [phone]   Do not eat food :After Midnight.   After Midnight you may have the following liquids until 4:30 AM DAY OF SURGERY  Water Non-Citrus Juices (without pulp, NO RED-Apple, White grape, White cranberry) Black Coffee (NO MILK/CREAM OR CREAMERS, sugar ok)  Clear Tea (NO MILK/CREAM OR CREAMERS, sugar ok) regular and decaf                             Plain Jell-O (NO RED)                                           Fruit ices (not with fruit pulp, NO RED)                                     Popsicles (NO RED)                                                               Sports drinks like Gatorade (NO RED)     The day of surgery:  Drink ONE (1) Pre-Surgery G2 at 4:30 AM the morning of surgery. Drink in one sitting. Do not sip.  This drink was given to you during your hospital  pre-op appointment visit. Nothing else to drink after completing the  Pre-Surgery G2.          If you have questions, please contact your surgeon's office.   FOLLOW BOWEL PREP  AND ANY ADDITIONAL PRE OP INSTRUCTIONS YOU RECEIVED FROM YOUR SURGEON'S OFFICE!!!     Oral Hygiene is also important to reduce your risk of infection.                                    Remember - BRUSH YOUR TEETH THE MORNING OF SURGERY WITH YOUR REGULAR TOOTHPASTE  DENTURES WILL BE REMOVED PRIOR TO SURGERY PLEASE DO NOT APPLY Poly grip OR ADHESIVES!!!   Stop all vitamins and herbal supplements 7 days before surgery.  Take these medicines the morning of surgery with A SIP OF WATER: Inhalers, Xanax, Lipitor, Bupropion , Gabapentin , Flonase , Metoprolol , Omeprazole , Percocet   DO NOT TAKE ANY ORAL DIABETIC MEDICATIONS DAY OF YOUR SURGERY  How to Manage Your Diabetes Before and After Surgery  Why is it important to control my blood sugar before and after surgery? Improving blood sugar levels before and after surgery helps healing and can limit problems. A way of improving blood sugar control is eating a healthy diet by:  Eating less sugar and carbohydrates  Increasing activity/exercise  Talking with your doctor about reaching your blood sugar goals High blood sugars (greater than 180 mg/dL) can raise your risk of infections and slow your recovery, so you will need to focus on controlling your diabetes during the weeks before surgery. Make sure that the doctor who takes care of your diabetes knows about your planned surgery including the date and location.  How do I manage my blood sugar before surgery? Check your blood sugar at least 4 times a day, starting 2 days before surgery, to make sure that the level is not too high or low. Check your blood sugar the morning of your surgery when you wake up and every 2 hours until you get to the Short Stay unit. If your blood sugar is less than 70 mg/dL, you will need to treat for low blood sugar: Do not take insulin . Treat a low blood sugar (less than 70 mg/dL) with  cup of clear juice (cranberry or apple), 4 glucose tablets, OR glucose  gel. Recheck blood sugar in 15 minutes after treatment (to make sure it is greater than 70 mg/dL). If your blood sugar is not greater than 70 mg/dL on recheck, call [phone] for further instructions. Report your blood sugar to the short stay nurse when you get to Short Stay.  If you are admitted to the hospital after surgery: Your blood sugar will be checked by the staff and you will probably be given insulin  after surgery (instead of oral diabetes medicines) to make sure you have good blood sugar levels. The goal for blood sugar control after surgery is 80-180 mg/dL.   WHAT DO I DO ABOUT MY DIABETES MEDICATION?  Do not take oral diabetes medicines (pills) the morning of surgery.  DO not take Ozempic  after 10/12/23.  DO NOT TAKE THE FOLLOWING 7 DAYS PRIOR TO SURGERY: Ozempic , Wegovy , Rybelsus  (Semaglutide ), Byetta (exenatide), Bydureon (exenatide ER), Victoza, Saxenda (liraglutide), or Trulicity (dulaglutide) Mounjaro (Tirzepatide) Adlyxin (Lixisenatide), Polyethylene Glycol Loxenatide.   Reviewed and Endorsed by [facility] Patient Education Committee, August 2015                              You may not have any metal on your body including hair pins, jewelry, and body piercing             Do not wear make-up, lotions, powders, perfumes, or deodorant  Do not wear nail polish including gel and S&S, artificial/acrylic nails, or any other type of covering on natural nails including finger and toenails. If you have artificial nails, gel coating, etc. that needs to be removed by a nail salon please have this removed prior to surgery or surgery may need to be canceled/ delayed if the surgeon/ anesthesia feels like they are unable to be safely monitored.   Do not shave  48 hours prior to surgery.    Do not bring valuables to the  hospital. [facility] IS NOT             RESPONSIBLE   FOR VALUABLES.   Contacts, glasses, dentures or bridgework may not be worn into surgery.  DO NOT BRING  YOUR HOME MEDICATIONS TO THE HOSPITAL. PHARMACY WILL DISPENSE MEDICATIONS LISTED ON YOUR MEDICATION LIST TO YOU DURING YOUR ADMISSION IN THE HOSPITAL!    Patients discharged on the day of surgery will not be allowed to drive home.  Someone NEEDS to stay with you for the first 24 hours after anesthesia.   Special Instructions: Bring a copy of your healthcare power of attorney and living will documents the day of surgery if you haven't scanned them before.              Please read over the following fact sheets you were given: IF YOU HAVE QUESTIONS ABOUT YOUR PRE-OP INSTRUCTIONS PLEASE CALL [phone]   If you received a COVID test during your pre-op visit  it is requested that you wear a mask when out in public, stay away from anyone that may not be feeling well and notify your surgeon if you develop symptoms. If you test positive for Covid or have been in contact with anyone that has tested positive in the last 10 days please notify you surgeon.      Pre-operative 5 CHG Bath Instructions   You can play a key role in reducing the risk of infection after surgery. Your skin needs to be as free of germs as possible. You can reduce the number of germs on your skin by washing with CHG (chlorhexidine  gluconate) soap before surgery. CHG is an antiseptic soap that kills germs and continues to kill germs even after washing.   DO NOT use if you have an allergy to chlorhexidine /CHG or antibacterial soaps. If your skin becomes reddened or irritated, stop using the CHG and notify one of our RNs at [phone].   Please shower with the CHG soap starting 4 days before surgery using the following schedule:     Please keep in mind the following:  DO NOT shave, including legs and underarms, starting the day of your first shower.   You may shave your face at any point before/day of surgery.  Place clean sheets on your bed the day you start using CHG soap. Use a clean washcloth (not used since  being washed) for each shower. DO NOT sleep with pets once you start using the CHG.   CHG Shower Instructions:  If you choose to wash your hair and private area, wash first with your normal shampoo/soap.  After you use shampoo/soap, rinse your hair and body thoroughly to remove shampoo/soap residue.  Turn the water OFF and apply about 3 tablespoons (45 ml) of CHG soap to a CLEAN washcloth.  Apply CHG soap ONLY FROM YOUR NECK DOWN TO YOUR TOES (washing for 3-5 minutes)  DO NOT use CHG soap on face, private areas, open wounds, or sores.  Pay special attention to the area where your surgery is being performed.  If you are having back surgery, having someone wash your back for you may be helpful. Wait 2 minutes after CHG soap is applied, then you may rinse off the CHG soap.  Pat dry with a clean towel  Put on clean clothes/pajamas   If you choose to wear lotion, please use ONLY the CHG-compatible lotions on the back of this paper.     Additional instructions for the day of surgery: DO  NOT APPLY any lotions, deodorants, cologne, or perfumes.   Put on clean/comfortable clothes.  Brush your teeth.  Ask your nurse before applying any prescription medications to the skin.      CHG Compatible Lotions   Aveeno Moisturizing lotion  Cetaphil Moisturizing Cream  Cetaphil Moisturizing Lotion  Clairol Herbal Essence Moisturizing Lotion, Dry Skin  Clairol Herbal Essence Moisturizing Lotion, Extra Dry Skin  Clairol Herbal Essence Moisturizing Lotion, Normal Skin  Curel Age Defying Therapeutic Moisturizing Lotion with Alpha Hydroxy  Curel Extreme Care Body Lotion  Curel Soothing Hands Moisturizing Hand Lotion  Curel Therapeutic Moisturizing Cream, Fragrance-Free  Curel Therapeutic Moisturizing Lotion, Fragrance-Free  Curel Therapeutic Moisturizing Lotion, Original Formula  Eucerin Daily Replenishing Lotion  Eucerin Dry Skin Therapy Plus Alpha Hydroxy Crme  Eucerin Dry Skin Therapy Plus Alpha  Hydroxy Lotion  Eucerin Original Crme  Eucerin Original Lotion  Eucerin Plus Crme Eucerin Plus Lotion  Eucerin TriLipid Replenishing Lotion  Keri Anti-Bacterial Hand Lotion  Keri Deep Conditioning Original Lotion Dry Skin Formula Softly Scented  Keri Deep Conditioning Original Lotion, Fragrance Free Sensitive Skin Formula  Keri Lotion Fast Absorbing Fragrance Free Sensitive Skin Formula  Keri Lotion Fast Absorbing Softly Scented Dry Skin Formula  Keri Original Lotion  Keri Skin Renewal Lotion Keri Silky Smooth Lotion  Keri Silky Smooth Sensitive Skin Lotion  Nivea Body Creamy Conditioning Oil  Nivea Body Extra Enriched Lotion  Nivea Body Original Lotion  Nivea Body Sheer Moisturizing Lotion Nivea Crme  Nivea Skin Firming Lotion  NutraDerm 30 Skin Lotion  NutraDerm Skin Lotion  NutraDerm Therapeutic Skin Cream  NutraDerm Therapeutic Skin Lotion  ProShield Protective Hand Cream  Provon moisturizing lotion   [facility]- Preparing for Total Shoulder Arthroplasty    Before surgery, you can play an important role. Because skin is not sterile, your skin needs to be as free of germs as possible. You can reduce the number of germs on your skin by using the following products. Benzoyl Peroxide Gel Reduces the number of germs present on the skin Applied twice a day to shoulder area starting two days before surgery    ==================================================================  Please follow these instructions carefully:  BENZOYL PEROXIDE 5% GEL  Please do not use if you have an allergy to benzoyl peroxide.   If your skin becomes reddened/irritated stop using the benzoyl peroxide.  Starting two days before surgery, apply as follows: Apply benzoyl peroxide in the morning and at night. Apply after taking a shower. If you are not taking a shower clean entire shoulder front, back, and side along with the armpit with a clean wet washcloth.  Place a quarter-sized dollop on your  shoulder and rub in thoroughly, making sure to cover the front, back, and side of your shoulder, along with the armpit.   2 days before ____ AM   ____ PM              1 day before ____ AM   ____ PM                         Do this twice a day for two days.  (Last application is the night before surgery, AFTER using the CHG soap as described below).  Do NOT apply benzoyl peroxide gel on the day of surgery.  Incentive Spirometer  An incentive spirometer is a tool that can help keep your lungs clear and active. This tool measures how well you are filling your lungs with  each breath. Taking long deep breaths may help reverse or decrease the chance of developing breathing (pulmonary) problems (especially infection) following: A long period of time when you are unable to move or be active. BEFORE THE PROCEDURE  If the spirometer includes an indicator to show your best effort, your nurse or respiratory therapist will set it to a desired goal. If possible, sit up straight or lean slightly forward. Try not to slouch. Hold the incentive spirometer in an upright position. INSTRUCTIONS FOR USE  Sit on the edge of your bed if possible, or sit up as far as you can in bed or on a chair. Hold the incentive spirometer in an upright position. Breathe out normally. Place the mouthpiece in your mouth and seal your lips tightly around it. Breathe in slowly and as deeply as possible, raising the piston or the ball toward the top of the column. Hold your breath for 3-5 seconds or for as long as possible. Allow the piston or ball to fall to the bottom of the column. Remove the mouthpiece from your mouth and breathe out normally. Rest for a few seconds and repeat Steps 1 through 7 at least 10 times every 1-2 hours when you are awake. Take your time and take a few normal breaths between deep breaths. The spirometer may include an indicator to show your best effort. Use the indicator as a goal to work toward during  each repetition. After each set of 10 deep breaths, practice coughing to be sure your lungs are clear. If you have an incision (the cut made at the time of surgery), support your incision when coughing by placing a pillow or rolled up towels firmly against it. Once you are able to get out of bed, walk around indoors and cough well. You may stop using the incentive spirometer when instructed by your caregiver.  RISKS AND COMPLICATIONS Take your time so you do not get dizzy or light-headed. If you are in pain, you may need to take or ask for pain medication before doing incentive spirometry. It is harder to take a deep breath if you are having pain. AFTER USE Rest and breathe slowly and easily. It can be helpful to keep track of a log of your progress. Your caregiver can provide you with a simple table to help with this. If you are using the spirometer at home, follow these instructions: SEEK MEDICAL CARE IF:  You are having difficultly using the spirometer. You have trouble using the spirometer as often as instructed. Your pain medication is not giving enough relief while using the spirometer. You develop fever of 100.5 F (38.1 C) or higher. SEEK IMMEDIATE MEDICAL CARE IF:  You cough up bloody sputum that had not been present before. You develop fever of 102 F (38.9 C) or greater. You develop worsening pain at or near the incision site. MAKE SURE YOU:  Understand these instructions. Will watch your condition. Will get help right away if you are not doing well or get worse. Document Released: 08/16/2006 Document Revised: 06/28/2011 Document Reviewed: 10/17/2006 [facility] Patient Information 2014 [place], [place].   ________________________________________________________________________

## 2023-10-14 ENCOUNTER — Ambulatory Visit

## 2023-10-20 ENCOUNTER — Ambulatory Visit: Payer: 59

## 2023-10-20 VITALS — Ht 68.0 in | Wt 214.0 lb

## 2023-10-20 DIAGNOSIS — Z0000 Encounter for general adult medical examination without abnormal findings: Secondary | ICD-10-CM

## 2023-10-20 NOTE — Patient Instructions (Signed)
 [name] , Thank you for taking time out of your busy schedule to complete your Annual Wellness Visit with me. I enjoyed our conversation and look forward to speaking with you again next year. I, as well as your care team,  appreciate your ongoing commitment to your health goals. Please review the following plan we discussed and let me know if I can assist you in the future. Your Game plan/ To Do List    Referrals: If you haven't heard from the office you've been referred to, please reach out to them at the phone provided.   Follow up Visits: Next Medicare AWV with our clinical staff: 10/22/2024 at 1:00 pm phone vist with Nurse Health Advisor   Have you seen your provider in the last 6 months (3 months if uncontrolled diabetes)? Yes Next Office Visit with your provider: Office will call to schedule [facility] at [facility].  Clinician Recommendations:  Aim for 30 minutes of exercise or brisk walking, 6-8 glasses of water, and 5 servings of fruits and vegetables each day.       This is a list of the screening recommended for you and due dates:  Health Maintenance  Topic Date Due   Zoster (Shingles) Vaccine (2 of 2) 05/30/2020   Pneumococcal Vaccination (2 of 2 - PCV) 08/20/2020   Colon Cancer Screening  01/25/2021   Eye exam for diabetics  11/03/2021   COVID-19 Vaccine (5 - 2024-25 season) 12/19/2022   Hemoglobin A1C  04/01/2023   Yearly kidney health urinalysis for diabetes  09/30/2023   Flu Shot  11/18/2023   Yearly kidney function blood test for diabetes  05/16/2024   Complete foot exam   05/16/2024   Mammogram  05/26/2024   Medicare Annual Wellness Visit  10/19/2024   DTaP/Tdap/Td vaccine (3 - Td or Tdap) 04/12/2025   Pap with HPV screening  11/12/2027   Hepatitis C Screening  Completed   HIV Screening  Completed   Hepatitis B Vaccine  Aged Out   HPV Vaccine  Aged Out   Meningitis B Vaccine  Aged Out    Advanced directives: (Declined) Advance directive discussed with you today. Even  though you declined this today, please call our office should you change your mind, and we can give you the proper paperwork for you to fill out. Advance Care Planning is important because it:  [x]  Makes sure you receive the medical care that is consistent with your values, goals, and preferences  [x]  It provides guidance to your family and loved ones and reduces their decisional burden about whether or not they are making the right decisions based on your wishes.  Follow the link provided in your after visit summary or read over the paperwork we have mailed to you to help you started getting your Advance Directives in place. If you need assistance in completing these, please reach out to us  so that we can help you!  See attachments for Preventive Care and Fall Prevention Tips.

## 2023-10-20 NOTE — Progress Notes (Signed)
 Because this visit was a virtual/telehealth visit,  certain criteria was not obtained, such a blood pressure, CBG if applicable, and timed get up and go. Any medications not marked as taking were not mentioned during the medication reconciliation part of the visit. Any vitals not documented were not able to be obtained due to this being a telehealth visit or patient was unable to self-report a recent blood pressure reading due to a lack of equipment at home via telehealth. Vitals that have been documented are verbally provided by the patient.   Subjective:   Norma [name] is a 63 y.o. who presents for a Medicare Wellness preventive visit.  As a reminder, Annual Wellness Visits don't include a physical exam, and some assessments may be limited, especially if this visit is performed virtually. We may recommend an in-person follow-up visit with your provider if needed.  Visit Complete: Virtual I connected with  Norma [name] on 10/20/23 by a audio enabled telemedicine application and verified that I am speaking with the correct person using two identifiers.  Patient Location: Home  Provider Location: Office/Clinic  I discussed the limitations of evaluation and management by telemedicine. The patient expressed understanding and agreed to proceed.  Vital Signs: Because this visit was a virtual/telehealth visit, some criteria may be missing or patient reported. Any vitals not documented were not able to be obtained and vitals that have been documented are patient reported.  VideoDeclined- This patient declined Librarian, academic. Therefore the visit was completed with audio only.  Persons Participating in Visit: Patient.  AWV Questionnaire: No: Patient Medicare AWV questionnaire was not completed prior to this visit.  Cardiac Risk Factors include: advanced age (>29men, >42 women);hypertension;obesity (BMI >30kg/m2);family history of premature cardiovascular  disease;diabetes mellitus     Objective:    Today's Vitals   10/20/23 1347  Weight: 214 lb (97.1 kg)  Height: 5' 8 (1.727 m)  PainSc: 0-No pain   Body mass index is 32.54 kg/m.     10/20/2023    1:48 PM 10/12/2023    2:41 PM 08/01/2023    2:51 PM 04/27/2023    8:58 PM 12/23/2022    2:19 PM 12/15/2022    8:25 AM 12/06/2022    4:00 PM  Advanced Directives  Does Patient Have a Medical Advance Directive? No No No No No No No  Would patient like information on creating a medical advance directive? No - Patient declined Yes (MAU/Ambulatory/Procedural Areas - Information given) Yes (Inpatient - patient defers creating a medical advance directive at this time - Information given) No - Patient declined No - Patient declined No - Patient declined No - Patient declined    Current Medications (verified) Outpatient Encounter Medications as of 10/20/2023  Medication Sig   OLANZapine (ZYPREXA) 2.5 MG tablet Take 2.5 mg by mouth at bedtime.   albuterol (VENTOLIN HFA) 108 (90 Base) MCG/ACT inhaler Inhale 1-2 puffs into the lungs every 4 (four) hours as needed for wheezing or shortness of breath.   ALPRAZolam (XANAX) 0.25 MG tablet Take 0.25 mg by mouth 3 (three) times daily as needed for anxiety.   atorvastatin  (LIPITOR) 10 MG tablet Take 1 tablet (10 mg total) by mouth daily.   azelastine  (ASTELIN ) 0.1 % nasal spray Place 2 sprays into both nostrils 2 (two) times daily. Use in each nostril as directed (Patient taking differently: Place 2 sprays into both nostrils 2 (two) times daily as needed for rhinitis or allergies. Use in each nostril as  directed)   Blood Glucose Monitoring Suppl (ONE TOUCH ULTRA 2) w/Device KIT Use as directed   buPROPion  (WELLBUTRIN  SR) 200 MG 12 hr tablet Take 200 mg by mouth 2 (two) times daily.   cetirizine  (ZYRTEC  ALLERGY) 10 MG tablet Take 1 tablet (10 mg total) by mouth daily. (Patient taking differently: Take 10 mg by mouth daily as needed for allergies.)   cyclobenzaprine   (FLEXERIL ) 10 MG tablet Take 10 mg by mouth 4 (four) times daily as needed for muscle spasms.   diclofenac  Sodium (VOLTAREN ) 1 % GEL Please apply 1-2 times daily to affected area (Patient taking differently: Apply 1 Application topically 2 (two) times daily as needed (joint pain).)   dicyclomine  (BENTYL ) 10 MG capsule Take 10 mg by mouth 3 (three) times daily as needed for spasms.   EPINEPHrine  0.3 mg/0.3 mL IJ SOAJ injection Inject into the muscle as directed.   ferrous gluconate  (FERGON) 324 MG tablet Take 1 tablet (324 mg total) by mouth daily with breakfast.   fluticasone  (FLONASE ) 50 MCG/ACT nasal spray Place 2 sprays into both nostrils daily. (Patient taking differently: Place 2 sprays into both nostrils daily as needed for allergies.)   gabapentin  (NEURONTIN ) 800 MG tablet Take 1 tablet (800 mg total) by mouth 4 (four) times daily. (Patient taking differently: Take 800 mg by mouth 2 (two) times daily.)   hydrochlorothiazide  (HYDRODIURIL ) 50 MG tablet Take 50 mg by mouth daily.   Lancets (ONETOUCH DELICA PLUS LANCET33G) MISC Use as directed   lidocaine  (LIDODERM ) 5 % Place 1 patch onto the skin daily. Remove & Discard patch within 12 hours or as directed by MD (Patient taking differently: Place 1 patch onto the skin daily as needed (pain). Remove & Discard patch within 12 hours or as directed by MD)   linaclotide (LINZESS) 290 MCG CAPS capsule Take 290 mcg by mouth daily before breakfast.   meloxicam  (MOBIC ) 15 MG tablet Take 15 mg by mouth daily.   metoprolol  succinate (TOPROL -XL) 25 MG 24 hr tablet Take 25 mg by mouth daily.   naloxone (NARCAN) nasal spray 4 mg/0.1 mL Place 1 spray into the nose once.   olopatadine (PATANOL) 0.1 % ophthalmic solution Place 1 drop into both eyes 2 (two) times daily as needed for allergies.   omeprazole  (PRILOSEC) 20 MG capsule Take 2 capsules (40 mg total) by mouth daily.   oxyCODONE -acetaminophen  (PERCOCET) 10-325 MG tablet Take 1 tablet by mouth 3 (three)  times daily.   RESTASIS 0.05 % ophthalmic emulsion Place 1 drop into both eyes 2 (two) times daily.   REZDIFFRA 100 MG TABS Take 1 tablet by mouth daily.   Semaglutide , 2 MG/DOSE, 8 MG/3ML SOPN Please inject 2 mg weekly.   SYMBICORT 160-4.5 MCG/ACT inhaler Inhale 2 puffs into the lungs in the morning and at bedtime.   traZODone  (DESYREL ) 100 MG tablet TAKE 2 TABLETS BY MOUTH ONCE AT BEDTIME   Vitamin D , Ergocalciferol , (DRISDOL ) 50000 units CAPS capsule TAKE 1 CAPSULE BY MOUTH EVERY 7 (SEVEN) DAYS.   [DISCONTINUED] ferrous sulfate  325 (65 FE) MG tablet Take 1 tablet (325 mg total) by mouth 2 (two) times daily with a meal.   [DISCONTINUED] loratadine  (CLARITIN ) 10 MG tablet Take 1 tablet (10 mg total) by mouth daily.   No facility-administered encounter medications on file as of 10/20/2023.    Allergies (verified) Soma  [carisoprodol ], Nortriptyline, and Prednisone    History: Past Medical History:  Diagnosis Date   A-fib (HCC)    Acid reflux  Allergy    Anemia    Anxiety    Arthritis    Back pain    Depression    Diabetes mellitus without complication (HCC)    on meds   Dysrhythmia    Fatty liver    Headache    stress headaches, migraines at times   Heart murmur    Hypertension    Irregular heart beat    Left shoulder pain    Shortness of breath dyspnea    with exertion   Tachyarrhythmia    Trigger point of thoracic region 03/22/2012   Past Surgical History:  Procedure Laterality Date   ANKLE SURGERY     CHOLECYSTECTOMY N/A 03/20/2020   Procedure: LAPAROSCOPIC CHOLECYSTECTOMY WITH INTRAOPERATIVE CHOLANGIOGRAM AND WEDGE LIVER BIOPSY;  Surgeon: Gladis Cough, MD;  Location: [facility];  Service: General;  Laterality: N/A;   KNEE SURGERY Left    LUMBAR LAMINECTOMY/DECOMPRESSION MICRODISCECTOMY Left 06/06/2014   Procedure: LUMBAR LAMINECTOMY/DECOMPRESSION MICRODISCECTOMY 1 LEVEL;  Surgeon: Reyes Budge, MD;  Location: [facility];  Service: Neurosurgery;  Laterality:  Left;  Left L5S1 microdiskectomy   ROTATOR CUFF REPAIR Bilateral 02/20/2014   TUBAL LIGATION     Family History  Problem Relation Age of Onset   Diabetes Brother    Diabetes Maternal Aunt    Diabetes Paternal Aunt    Diabetes Maternal Grandmother    Diabetes Maternal Grandfather    Diabetes Paternal Grandmother    Diabetes Paternal Grandfather    Congestive Heart Failure Mother    Hypertension Son    Colon cancer Neg Hx    Esophageal cancer Neg Hx    Rectal cancer Neg Hx    Stomach cancer Neg Hx    Social History   Socioeconomic History   Marital status: Legally Separated    Spouse name: Not on file   Number of children: 4   Years of education: 12   Highest education level: 12th grade  Occupational History   Not on file  Tobacco Use   Smoking status: Never    Passive exposure: Never   Smokeless tobacco: Never  Vaping Use   Vaping status: Never Used  Substance and Sexual Activity   Alcohol  use: Not Currently    Comment: glass of wine occasionally   Drug use: No    Comment: States no longer uses marijuana   Sexual activity: Yes    Birth control/protection: Condom  Other Topics Concern   Not on file  Social History Narrative   Lives by herself, 3 year old son comes and goes.       Has a pitbull dog named Duchess. Lives in one level house with two steps in back. Going to start going back to church. Likes to go out to eat and go to movies. Watches grandchildren, has six with two expected. Has occasional food insecurity.    Social Drivers of Corporate investment banker Strain: Low Risk  (10/20/2023)   Overall Financial Resource Strain (CARDIA)    Difficulty of Paying Living Expenses: Not hard at all  Food Insecurity: No Food Insecurity (10/20/2023)   Hunger Vital Sign    Worried About Running Out of Food in the Last Year: Never true    Ran Out of Food in the Last Year: Never true  Transportation Needs: No Transportation Needs (10/20/2023)   PRAPARE -  Administrator, Civil Service (Medical): No    Lack of Transportation (Non-Medical): No  Physical Activity: Sufficiently Active (10/20/2023)  Exercise Vital Sign    Days of Exercise per Week: 5 days    Minutes of Exercise per Session: 30 min  Stress: No Stress Concern Present (10/20/2023)   [employer] of Occupational Health - Occupational Stress Questionnaire    Feeling of Stress: Not at all  Social Connections: Moderately Isolated (10/20/2023)   Social Connection and Isolation Panel    Frequency of Communication with Friends and Family: More than three times a week    Frequency of Social Gatherings with Friends and Family: Twice a week    Attends Religious Services: 1 to 4 times per year    Active Member of [employer] or Organizations: No    Attends Engineer, structural: Never    Marital Status: Never married    Tobacco Counseling Counseling given: Not Answered    Clinical Intake:  Pre-visit preparation completed: Yes  Pain : No/denies pain Pain Score: 0-No pain     BMI - recorded: 32.54 Nutritional Status: BMI > 30  Obese Nutritional Risks: None Diabetes: Yes CBG done?: No Did pt. bring in CBG monitor from home?: No  Lab Results  Component Value Date   HGBA1C 5.7 09/30/2022   HGBA1C 6.0 06/28/2022   HGBA1C 6.2 10/15/2021     How often do you need to have someone help you when you read instructions, pamphlets, or other written materials from your doctor or pharmacy?: 1 - Never What is the last grade level you completed in school?: HSG  Interpreter Needed?: No  Information entered by :: Elery Cadenhead N. Bettylou Frew, LPN.   Activities of Daily Living     10/20/2023    1:51 PM 10/12/2023    2:40 PM  In your present state of health, do you have any difficulty performing the following activities:  Hearing? 0 0  Vision? 0 0  Difficulty concentrating or making decisions? 0 0  Walking or climbing stairs? 0   Dressing or bathing? 0   Doing errands,  shopping? 0   Preparing Food and eating ? N   Using the Toilet? N   In the past six months, have you accidently leaked urine? N   Do you have problems with loss of bowel control? N   Managing your Medications? N   Managing your Finances? N   Housekeeping or managing your Housekeeping? N     Patient Care Team: Nicholas Bar, MD as PCP - General (Family Medicine) Christine Rush, DPM as Consulting Physician (Podiatry) Claudene Pacific, MD as Consulting Physician (Cardiology) Austin Mutton, MD as Referring Physician (Internal Medicine) Wonda Cy BROCKS, RD as Dietitian (Family Medicine) Caleen Kitty at [facility] as Consulting Physician (Optometry) [facility] at [facility] as Consulting Physician (Urgent Care)  I have updated your Care Teams any recent Medical Services you may have received from other providers in the past year.     Assessment:   This is a routine wellness examination for Dulcy.  Hearing/Vision screen Hearing Screening - Comments:: Denies hearing difficulties.  Vision Screening - Comments:: Wears rx glasses - up to date with routine eye exams with Caleen Kitty [facility]    Goals Addressed               This Visit's Progress     Weight (lb) < 170 lb (77.1 kg) (pt-stated)   214 lb (97.1 kg)     My goal is to keep my weight down, continue to maintain my health, stay active and eat healthy.  Weight (lb) < 228 lb (103.4 kg) (pt-stated)   214 lb (97.1 kg)     7% weight loss       Depression Screen     10/20/2023    1:50 PM 12/15/2022    8:27 AM 12/06/2022    4:00 PM 10/18/2022    7:03 PM 09/30/2022    4:26 PM 08/06/2022    2:58 PM 06/28/2022    1:26 PM  PHQ 2/9 Scores  PHQ - 2 Score 0 0  0  0 1  PHQ- 9 Score 0 0    0 4  Exception Documentation   Patient refusal  Patient refusal      Fall Risk     10/20/2023    1:48 PM 08/01/2023    2:50 PM 10/18/2022    7:01 PM 08/06/2022    2:58 PM 06/28/2022    1:26 PM  Fall Risk   Falls in the past year? 0 1 0 0  0  Number falls in past yr: 0 0 0 0 0  Injury with Fall? 0 1 0 0 0  Comment  hurt L leg and hip     Risk for fall due to : No Fall Risks  No Fall Risks    Follow up Falls evaluation completed  Falls prevention discussed;Education provided;Falls evaluation completed      MEDICARE RISK AT HOME:  Medicare Risk at Home Any stairs in or around the home?: No If so, are there any without handrails?: No Home free of loose throw rugs in walkways, pet beds, electrical cords, etc?: Yes Adequate lighting in your home to reduce risk of falls?: Yes Life alert?: No Use of a cane, walker or w/c?: No Grab bars in the bathroom?: Yes Shower chair or bench in shower?: No Elevated toilet seat or a handicapped toilet?: No  TIMED UP AND GO:  Was the test performed?  No  Cognitive Function: 6CIT completed    10/20/2023    1:50 PM 01/02/2018    3:19 PM  MMSE - Mini Mental State Exam  Not completed: Unable to complete   Orientation to time  5  Orientation to Place  5  Registration  3  Attention/ Calculation  5  Recall  3  Language- name 2 objects  2  Language- repeat  1  Language- follow 3 step command  3  Language- read & follow direction  1  Write a sentence  1  Copy design  1  Total score  30        10/20/2023    2:04 PM 10/18/2022    7:02 PM 01/02/2021    3:14 PM 01/02/2018    3:19 PM  6CIT Screen  What Year? 0 points 0 points 0 points 0 points  What month? 0 points 0 points 0 points 0 points  What time? 0 points 0 points 0 points 0 points  Count back from 20 0 points 0 points 0 points 0 points  Months in reverse 0 points 0 points 0 points 0 points  Repeat phrase 0 points 0 points 0 points 0 points  Total Score 0 points 0 points 0 points 0 points    Immunizations Immunization History  Administered Date(s) Administered   Influenza Split 01/22/2011, 01/31/2012   Influenza Whole 02/07/2007, 01/19/2008, 02/07/2009, 01/19/2010   Influenza,inj,Quad PF,6+ Mos 01/10/2013, 01/14/2014,  03/06/2015, 02/24/2017, 12/22/2017, 01/23/2019, 02/18/2020, 04/22/2021   Influenza-Unspecified 01/05/2023   Moderna Sars-Covid-2 Vaccination 08/31/2019, 09/26/2019, 04/29/2020   PPD Test 06/02/2010,  06/09/2010, 06/16/2011, 12/04/2012   Pfizer Covid-19 Vaccine Bivalent Booster 6yrs & up 04/22/2021   Pneumococcal Polysaccharide-23 08/21/2019   Rsv, Bivalent, Protein Subunit Rsvpref,pf Mar[place]) 01/07/2023   Td 01/17/2002   Tdap 04/13/2015   Zoster Recombinant(Shingrix) 04/04/2020    Screening Tests Health Maintenance  Topic Date Due   Zoster Vaccines- Shingrix (2 of 2) 05/30/2020   Pneumococcal Vaccine 48-82 Years old (2 of 2 - PCV) 08/20/2020   Colonoscopy  01/25/2021   OPHTHALMOLOGY EXAM  11/03/2021   COVID-19 Vaccine (5 - 2024-25 season) 12/19/2022   HEMOGLOBIN A1C  04/01/2023   Diabetic kidney evaluation - Urine ACR  09/30/2023   INFLUENZA VACCINE  11/18/2023   Diabetic kidney evaluation - eGFR measurement  05/16/2024   FOOT EXAM  05/16/2024   MAMMOGRAM  05/26/2024   Medicare Annual Wellness (AWV)  10/19/2024   DTaP/Tdap/Td (3 - Td or Tdap) 04/12/2025   Cervical Cancer Screening (HPV/Pap Cotest)  11/12/2027   Hepatitis C Screening  Completed   HIV Screening  Completed   Hepatitis B Vaccines  Aged Out   HPV VACCINES  Aged Out   Meningococcal B Vaccine  Aged Out    Health Maintenance  Health Maintenance Due  Topic Date Due   Zoster Vaccines- Shingrix (2 of 2) 05/30/2020   Pneumococcal Vaccine 65-83 Years old (2 of 2 - PCV) 08/20/2020   Colonoscopy  01/25/2021   OPHTHALMOLOGY EXAM  11/03/2021   COVID-19 Vaccine (5 - 2024-25 season) 12/19/2022   HEMOGLOBIN A1C  04/01/2023   Diabetic kidney evaluation - Urine ACR  09/30/2023   Health Maintenance Items Addressed: Yes Patient aware of current care gaps.  Immunization record was verified by [employer].    Additional Screening:  Vision Screening: Recommended annual ophthalmology exams for early detection of glaucoma and other  disorders of the eye. Would you like a referral to an eye doctor? No    Dental Screening: Recommended annual dental exams for proper oral hygiene  Community Resource Referral / Chronic Care Management: CRR required this visit?  No   CCM required this visit?  No   Plan:    I have personally reviewed and noted the following in the patient's chart:   Medical and social history Use of alcohol , tobacco or illicit drugs  Current medications and supplements including opioid prescriptions. Patient is not currently taking opioid prescriptions. Functional ability and status Nutritional status Physical activity Advanced directives List of other physicians Hospitalizations, surgeries, and ER visits in previous 12 months Vitals Screenings to include cognitive, depression, and falls Referrals and appointments  In addition, I have reviewed and discussed with patient certain preventive protocols, quality metrics, and best practice recommendations. A written personalized care plan for preventive services as well as general preventive health recommendations were provided to patient.   Roz LOISE Fuller, LPN   05/25/7972   After Visit Summary: (MyChart) Due to this being a telephonic visit, the after visit summary with patients personalized plan was offered to patient via MyChart   Notes: Patient aware of current care gaps.  Immunization record was verified by [employer].  Patient is due for vaccines, Colonoscopy and lab work.

## 2023-10-21 ENCOUNTER — Telehealth: Payer: Self-pay | Admitting: Family Medicine

## 2023-10-21 NOTE — Telephone Encounter (Signed)
 Hi Risk manager,  Please schedule this patient for follow up with PCP in next few months.   Thanks, Suzann Daring, MD  [facility] Teaching Service

## 2023-10-24 NOTE — Progress Notes (Signed)
 Patient phoned to give updated information on surgery.  Surgery was rescheduled because patient did not have an adult to accompany her after surgery.      Date of Surgery - 11-03-23  Arrival Time - 5:15 AM and check in at admitting.    NPO Status - patient reminded to not eat solid food after midnight.  From midnight until 4:30 may have clear liquids  Medications morning of surgery - Atorvastatin , Bupropion , Zyrtec , Gabapentin , Metoprolol , Omeprazole .  If needed Alprazolam, Oxycodone .  Patient was reminded to hold Ozempic  after 10-26-23.    Also reminded to start showers on 10-30-23.    No change in medical history, allergies per patient.  Transportation home - Daughter, [place], [phone]  All questions answered and patient stated understanding

## 2023-10-25 NOTE — Progress Notes (Signed)
 Anesthesia Chart Review   Case: [id number] Date/Time: 11/03/23 0715   Procedure: ARTHROPLASTY, SHOULDER, TOTAL, REVERSE (Right: Shoulder)   Anesthesia type: Choice   Diagnosis: Complete tear of right rotator cuff, unspecified whether traumatic [M75.121]   Pre-op diagnosis: RIGHT SHOULDER IRREPARABLE ROTATOR CUFF TEAR   Location: [facility] 07 / [facility]   Surgeons: Dozier Soulier, MD       DISCUSSION:62 y.o. never smoker with h/o HTN, DM II, a-fib, right shoulder rotator cuff tear scheduled for above procedure 11/03/2023 with Dr. Soulier Dozier.   A-fib added to history in 2021 during hospital admission.  A-fib not noted on EKG, no comments on A-fib in providers notes.  She is not on anticoagulants. EKG at PAT visit without a-fib.   She was seen by Dr. Claudene in 2014 for evaluation of chest pain.  Stress test in 2014 with no ischemia, she has not had a follow up since then.  No mention of a-fib in these notes. There was documentation of II/VI systolic murmur noted in Kadakia's note and again in PCP note in 2023.   Will request cardio clearance given questionable a-fib history and systolic murmur without echo.   VS: BP (!) 137/91   Pulse 99   Temp 37.1 C (Oral)   Resp 16   Ht 5' 7 (1.702 m)   Wt 97.1 kg   SpO2 96%   BMI 33.52 kg/m   PROVIDERS: Nicholas Bar, MD is PCP    LABS: Labs reviewed: Acceptable for surgery. (all labs ordered are listed, but only abnormal results are displayed)  Labs Reviewed  SURGICAL PCR SCREEN     IMAGES:   EKG:   CV:  Past Medical History:  Diagnosis Date   A-fib (HCC)    Acid reflux    Allergy    Anemia    Anxiety    Arthritis    Back pain    Depression    Diabetes mellitus without complication (HCC)    on meds   Dysrhythmia    Fatty liver    Headache    stress headaches, migraines at times   Heart murmur    Hypertension    Irregular heart beat    Left shoulder pain    Shortness of breath dyspnea    with exertion    Tachyarrhythmia    Trigger point of thoracic region 03/22/2012    Past Surgical History:  Procedure Laterality Date   ANKLE SURGERY     CHOLECYSTECTOMY N/A 03/20/2020   Procedure: LAPAROSCOPIC CHOLECYSTECTOMY WITH INTRAOPERATIVE CHOLANGIOGRAM AND WEDGE LIVER BIOPSY;  Surgeon: Gladis Cough, MD;  Location: [facility];  Service: General;  Laterality: N/A;   KNEE SURGERY Left    LUMBAR LAMINECTOMY/DECOMPRESSION MICRODISCECTOMY Left 06/06/2014   Procedure: LUMBAR LAMINECTOMY/DECOMPRESSION MICRODISCECTOMY 1 LEVEL;  Surgeon: Reyes Budge, MD;  Location: [facility];  Service: Neurosurgery;  Laterality: Left;  Left L5S1 microdiskectomy   ROTATOR CUFF REPAIR Bilateral 02/20/2014   TUBAL LIGATION      MEDICATIONS:  albuterol (VENTOLIN HFA) 108 (90 Base) MCG/ACT inhaler   ALPRAZolam (XANAX) 0.25 MG tablet   atorvastatin  (LIPITOR) 10 MG tablet   azelastine  (ASTELIN ) 0.1 % nasal spray   Blood Glucose Monitoring Suppl (ONE TOUCH ULTRA 2) w/Device KIT   buPROPion  (WELLBUTRIN  SR) 200 MG 12 hr tablet   cetirizine  (ZYRTEC  ALLERGY) 10 MG tablet   cyclobenzaprine  (FLEXERIL ) 10 MG tablet   diclofenac  Sodium (VOLTAREN ) 1 % GEL   dicyclomine  (BENTYL ) 10 MG capsule   EPINEPHrine  0.3  mg/0.3 mL IJ SOAJ injection   ferrous gluconate  (FERGON) 324 MG tablet   fluticasone  (FLONASE ) 50 MCG/ACT nasal spray   gabapentin  (NEURONTIN ) 800 MG tablet   hydrochlorothiazide  (HYDRODIURIL ) 50 MG tablet   Lancets (ONETOUCH DELICA PLUS LANCET33G) MISC   lidocaine  (LIDODERM ) 5 %   linaclotide (LINZESS) 290 MCG CAPS capsule   meloxicam  (MOBIC ) 15 MG tablet   metoprolol  succinate (TOPROL -XL) 25 MG 24 hr tablet   naloxone (NARCAN) nasal spray 4 mg/0.1 mL   OLANZapine (ZYPREXA) 2.5 MG tablet   olopatadine (PATANOL) 0.1 % ophthalmic solution   omeprazole  (PRILOSEC) 20 MG capsule   oxyCODONE -acetaminophen  (PERCOCET) 10-325 MG tablet   RESTASIS 0.05 % ophthalmic emulsion   REZDIFFRA 100 MG TABS   Semaglutide , 2 MG/DOSE,  8 MG/3ML SOPN   SYMBICORT 160-4.5 MCG/ACT inhaler   traZODone  (DESYREL ) 100 MG tablet   Vitamin D , Ergocalciferol , (DRISDOL ) 50000 units CAPS capsule   No current facility-administered medications for this encounter.     Harlene Hoots Ward, PA-C [facility] [phone]

## 2023-10-27 ENCOUNTER — Ambulatory Visit

## 2023-10-31 ENCOUNTER — Emergency Department (HOSPITAL_BASED_OUTPATIENT_CLINIC_OR_DEPARTMENT_OTHER): Admission: EM | Admit: 2023-10-31 | Discharge: 2023-10-31 | Disposition: A | Discharge status: Home / Self Care

## 2023-10-31 ENCOUNTER — Ambulatory Visit: Admitting: Family Medicine

## 2023-10-31 ENCOUNTER — Other Ambulatory Visit: Payer: Self-pay

## 2023-10-31 ENCOUNTER — Emergency Department (HOSPITAL_BASED_OUTPATIENT_CLINIC_OR_DEPARTMENT_OTHER)

## 2023-10-31 ENCOUNTER — Encounter (HOSPITAL_BASED_OUTPATIENT_CLINIC_OR_DEPARTMENT_OTHER): Payer: Self-pay | Admitting: Emergency Medicine

## 2023-10-31 DIAGNOSIS — R1012 Left upper quadrant pain: Secondary | ICD-10-CM | POA: Insufficient documentation

## 2023-10-31 DIAGNOSIS — I10 Essential (primary) hypertension: Secondary | ICD-10-CM | POA: Insufficient documentation

## 2023-10-31 DIAGNOSIS — D649 Anemia, unspecified: Secondary | ICD-10-CM | POA: Insufficient documentation

## 2023-10-31 DIAGNOSIS — E119 Type 2 diabetes mellitus without complications: Secondary | ICD-10-CM | POA: Diagnosis not present

## 2023-10-31 DIAGNOSIS — Z7984 Long term (current) use of oral hypoglycemic drugs: Secondary | ICD-10-CM | POA: Insufficient documentation

## 2023-10-31 DIAGNOSIS — D72829 Elevated white blood cell count, unspecified: Secondary | ICD-10-CM | POA: Insufficient documentation

## 2023-10-31 DIAGNOSIS — Z79899 Other long term (current) drug therapy: Secondary | ICD-10-CM | POA: Insufficient documentation

## 2023-10-31 DIAGNOSIS — R109 Unspecified abdominal pain: Secondary | ICD-10-CM

## 2023-10-31 LAB — CBC
HCT: 38.4 % (ref 36.0–46.0)
Hemoglobin: 12.5 g/dL (ref 12.0–15.0)
MCH: 28.6 pg (ref 26.0–34.0)
MCHC: 32.6 g/dL (ref 30.0–36.0)
MCV: 87.9 fL (ref 80.0–100.0)
Platelets: 184 K/uL (ref 150–400)
RBC: 4.37 MIL/uL (ref 3.87–5.11)
RDW: 13.3 % (ref 11.5–15.5)
WBC: 6.4 K/uL (ref 4.0–10.5)
nRBC: 0 % (ref 0.0–0.2)

## 2023-10-31 LAB — URINALYSIS, ROUTINE W REFLEX MICROSCOPIC
Bacteria, UA: NONE SEEN
Bilirubin Urine: NEGATIVE
Glucose, UA: NEGATIVE mg/dL
Hgb urine dipstick: NEGATIVE
Ketones, ur: NEGATIVE mg/dL
Nitrite: NEGATIVE
Protein, ur: NEGATIVE mg/dL
Specific Gravity, Urine: 1.022 (ref 1.005–1.030)
pH: 7 (ref 5.0–8.0)

## 2023-10-31 LAB — LIPASE, BLOOD: Lipase: 60 U/L — ABNORMAL HIGH (ref 11–51)

## 2023-10-31 LAB — COMPREHENSIVE METABOLIC PANEL WITH GFR
ALT: 23 U/L (ref 0–44)
AST: 35 U/L (ref 15–41)
Albumin: 4.1 g/dL (ref 3.5–5.0)
Alkaline Phosphatase: 70 U/L (ref 38–126)
Anion gap: 10 (ref 5–15)
BUN: 10 mg/dL (ref 8–23)
CO2: 27 mmol/L (ref 22–32)
Calcium: 9.3 mg/dL (ref 8.9–10.3)
Chloride: 102 mmol/L (ref 98–111)
Creatinine, Ser: 0.87 mg/dL (ref 0.44–1.00)
GFR, Estimated: >60 mL/min (ref >60)
Glucose, Bld: 107 mg/dL — ABNORMAL HIGH (ref 70–99)
Potassium: 3.6 mmol/L (ref 3.5–5.1)
Sodium: 139 mmol/L (ref 135–145)
Total Bilirubin: 0.7 mg/dL (ref 0.0–1.2)
Total Protein: 7.2 g/dL (ref 6.5–8.1)

## 2023-10-31 MED ORDER — OXYCODONE-ACETAMINOPHEN 5-325 MG PO TABS
1.0000 | ORAL_TABLET | Freq: Once | ORAL | Status: AC
  Administered 2023-10-31: 1 via ORAL
  Filled 2023-10-31: qty 1

## 2023-10-31 NOTE — ED Provider Notes (Signed)
 [place] [facility] AT [facility] Provider Note   CSN: [id number] Arrival date & time: 10/31/23  1521     Patient presents with: Abdominal Pain   Norma [name] is a 63 y.o. female with past medical history significant for GERD, obesity, hypertension, diabetes, anemia who presents with concern for left-sided abdominal pain for 3 days.  Reports worse with movement.  Reports sharp in nature.  Rates the pain 9/10 at the worst.  Denies fever, chills, nausea, vomiting, constipation, diarrhea, dysuria, vaginal discharge.  Does report she has previously had kidney stone.    Abdominal Pain      Prior to Admission medications   Medication Sig Start Date End Date Taking? Authorizing Provider  albuterol (VENTOLIN HFA) 108 (90 Base) MCG/ACT inhaler Inhale 1-2 puffs into the lungs every 4 (four) hours as needed for wheezing or shortness of breath. 08/25/20   [provider]  ALPRAZolam (XANAX) 0.25 MG tablet Take 0.25 mg by mouth 3 (three) times daily as needed for anxiety. 07/14/22   [provider]  atorvastatin  (LIPITOR) 10 MG tablet Take 1 tablet (10 mg total) by mouth daily. 10/01/23   Tharon Lung, MD  azelastine  (ASTELIN ) 0.1 % nasal spray Place 2 sprays into both nostrils 2 (two) times daily. Use in each nostril as directed Patient taking differently: Place 2 sprays into both nostrils 2 (two) times daily as needed for rhinitis or allergies. Use in each nostril as directed 08/01/23   Everhart, Kirstie, DO  Blood Glucose Monitoring Suppl (ONE TOUCH ULTRA 2) w/Device KIT Use as directed 08/05/22   McDiarmid, Krystal BIRCH, MD  buPROPion  (WELLBUTRIN  SR) 200 MG 12 hr tablet Take 200 mg by mouth 2 (two) times daily. 08/22/23   [provider]  cetirizine  (ZYRTEC  ALLERGY) 10 MG tablet Take 1 tablet (10 mg total) by mouth daily. Patient taking differently: Take 10 mg by mouth daily as needed for allergies. 11/07/20   Graham, Laura E, PA-C  cyclobenzaprine  (FLEXERIL )  10 MG tablet Take 10 mg by mouth 4 (four) times daily as needed for muscle spasms. 07/14/22   [provider]  diclofenac  Sodium (VOLTAREN ) 1 % GEL Please apply 1-2 times daily to affected area Patient taking differently: Apply 1 Application topically 2 (two) times daily as needed (joint pain). 10/09/19   Delane Lye, MD  dicyclomine  (BENTYL ) 10 MG capsule Take 10 mg by mouth 3 (three) times daily as needed for spasms. 07/13/22   [provider]  EPINEPHrine  0.3 mg/0.3 mL IJ SOAJ injection Inject into the muscle as directed. 07/22/20   [provider]  ferrous gluconate  (FERGON) 324 MG tablet Take 1 tablet (324 mg total) by mouth daily with breakfast. 04/08/23   Rosendo Rush, MD  fluticasone  (FLONASE ) 50 MCG/ACT nasal spray Place 2 sprays into both nostrils daily. Patient taking differently: Place 2 sprays into both nostrils daily as needed for allergies. 08/01/23   Everhart, Kirstie, DO  gabapentin  (NEURONTIN ) 800 MG tablet Take 1 tablet (800 mg total) by mouth 4 (four) times daily. Patient taking differently: Take 800 mg by mouth 2 (two) times daily. 02/19/22     hydrochlorothiazide  (HYDRODIURIL ) 50 MG tablet Take 50 mg by mouth daily. 08/30/23   [provider]  Lancets [facility] DELICA PLUS [place]) MISC Use as directed 06/29/22   [provider]  lidocaine  (LIDODERM ) 5 % Place 1 patch onto the skin daily. Remove & Discard patch within 12 hours or as directed by MD Patient taking differently: Place  1 patch onto the skin daily as needed (pain). Remove & Discard patch within 12 hours or as directed by MD 08/31/23   Cleotilde Perkins, DO  linaclotide (LINZESS) 290 MCG CAPS capsule Take 290 mcg by mouth daily before breakfast.    [provider]  meloxicam  (MOBIC ) 15 MG tablet Take 15 mg by mouth daily. 06/22/22   [provider]  metoprolol  succinate (TOPROL -XL) 25 MG 24 hr tablet Take 25 mg by mouth daily. 08/25/23   [provider]   naloxone [facility]) nasal spray 4 mg/0.1 mL Place 1 spray into the nose once. 12/29/20   [provider]  OLANZapine (ZYPREXA) 2.5 MG tablet Take 2.5 mg by mouth at bedtime. 10/14/23   [provider]  olopatadine (PATANOL) 0.1 % ophthalmic solution Place 1 drop into both eyes 2 (two) times daily as needed for allergies. 10/27/20   [provider]  omeprazole  (PRILOSEC) 20 MG capsule Take 2 capsules (40 mg total) by mouth daily. 05/25/19   Meccariello, Con PARAS, MD  oxyCODONE -acetaminophen  (PERCOCET) 10-325 MG tablet Take 1 tablet by mouth 3 (three) times daily. 07/23/19   [provider]  RESTASIS 0.05 % ophthalmic emulsion Place 1 drop into both eyes 2 (two) times daily. 11/03/20   [provider]  REZDIFFRA 100 MG TABS Take 1 tablet by mouth daily. 09/27/23   [provider]  Semaglutide , 2 MG/DOSE, 8 MG/3ML SOPN Please inject 2 mg weekly. 06/29/22   Nicholas Bar, MD  SYMBICORT 160-4.5 MCG/ACT inhaler Inhale 2 puffs into the lungs in the morning and at bedtime. 12/25/20   [provider]  traZODone  (DESYREL ) 100 MG tablet TAKE 2 TABLETS BY MOUTH ONCE AT BEDTIME 05/31/19   Meccariello, Con PARAS, MD  Vitamin D , Ergocalciferol , (DRISDOL ) 50000 units CAPS capsule TAKE 1 CAPSULE BY MOUTH EVERY 7 (SEVEN) DAYS. 12/09/17   Meccariello, Con PARAS, MD  ferrous sulfate  325 (65 FE) MG tablet Take 1 tablet (325 mg total) by mouth 2 (two) times daily with a meal. 02/22/19 04/14/19  Shirley, [place], DO  loratadine  (CLARITIN ) 10 MG tablet Take 1 tablet (10 mg total) by mouth daily. 04/11/19 08/12/19  Meccariello, Con PARAS, MD    Allergies: Soma  [carisoprodol ], Nortriptyline, and Prednisone     Review of Systems  Gastrointestinal:  Positive for abdominal pain.  All other systems reviewed and are negative.   Updated Vital Signs BP 129/73   Pulse 93   Temp 98.6 F (37 C)   Resp 18   SpO2 97%   Physical Exam Vitals and nursing note reviewed.   Constitutional:      General: She is not in acute distress.    Appearance: Normal appearance.  HENT:     Head: Normocephalic and atraumatic.  Eyes:     General:        Right eye: No discharge.        Left eye: No discharge.  Cardiovascular:     Rate and Rhythm: Normal rate and regular rhythm.     Heart sounds: No murmur heard.    No friction rub. No gallop.  Pulmonary:     Effort: Pulmonary effort is normal.     Breath sounds: Normal breath sounds.  Abdominal:     General: Bowel sounds are normal.     Palpations: Abdomen is soft.     Comments: Mild tenderness to palpation in the left upper quadrant, no rebound, rigidity, guarding, no epigastric tenderness, no right upper quadrant tenderness.  No abdominal distention.  Normal bowel sounds throughout.  Skin:    General: Skin is warm and dry.     Capillary Refill: Capillary refill takes less than 2 seconds.  Neurological:     Mental Status: She is alert and oriented to person, place, and time.  Psychiatric:        Mood and Affect: Mood normal.        Behavior: Behavior normal.     (all labs ordered are listed, but only abnormal results are displayed) Labs Reviewed  LIPASE, BLOOD - Abnormal; Notable for the following components:      Result Value   Lipase 60 (*)    All other components within normal limits  COMPREHENSIVE METABOLIC PANEL WITH GFR - Abnormal; Notable for the following components:   Glucose, Bld 107 (*)    All other components within normal limits  URINALYSIS, ROUTINE W REFLEX MICROSCOPIC - Abnormal; Notable for the following components:   Leukocytes,Ua SMALL (*)    All other components within normal limits  CBC    EKG: None  Radiology: CT Renal Stone Study Result Date: 10/31/2023 CLINICAL DATA:  Left-sided abdominal pain for 3 days worse with movement EXAM: CT ABDOMEN AND PELVIS WITHOUT CONTRAST TECHNIQUE: Multidetector CT imaging of the abdomen and pelvis was performed following the standard protocol  without IV contrast. RADIATION DOSE REDUCTION: This exam was performed according to the departmental dose-optimization program which includes automated exposure control, adjustment of the mA and/or kV according to patient size and/or use of iterative reconstruction technique. COMPARISON:  CT abdomen pelvis 03/16/2020 FINDINGS: Lower chest: No acute abnormality. Hepatobiliary: Unremarkable liver. Cholecystectomy. No biliary dilation. Pancreas: Unremarkable. Spleen: Unremarkable. Adrenals/Urinary Tract: Normal adrenal glands. Punctate nonobstructing left nephrolithiasis. No hydronephrosis. Bladder is unremarkable. Stomach/Bowel: Normal caliber large and small bowel. Duodenal diverticulum. No bowel wall thickening. The appendix is normal.Stomach is within normal limits. Vascular/Lymphatic: No significant vascular findings are present. No enlarged abdominal or pelvic lymph nodes. Reproductive: Uterus and right ovary are unremarkable. 5.7 x 5.1 cm mass along the posterior left uterus is unchanged compared to MRI 11/20/2021. Other: No free intraperitoneal fluid or air. Musculoskeletal: No acute fracture. IMPRESSION: 1. No acute abnormality in the abdomen or pelvis. 2. 5.7 cm mass along the posterior left uterus is unchanged compared to MRI 11/20/2021. Per that report differential considerations include benign ovarian fibroma, fibrothecoma, Brenner's tumor, or pedunculated fibroid. Electronically Signed   By: Norman Gatlin M.D.   On: 10/31/2023 18:58     Procedures   Medications Ordered in the ED  oxyCODONE -acetaminophen  (PERCOCET/ROXICET) 5-325 MG per tablet 1 tablet (1 tablet Oral Given 10/31/23 1800)                                    Medical Decision Making Amount and/or Complexity of Data Reviewed Labs: ordered. Radiology: ordered.  Risk Prescription drug management.   This patient is a 63 y.o. female  who presents to the ED for concern of abdominal pain.   Differential diagnoses prior to  evaluation: The emergent differential diagnosis includes, but is not limited to,  The causes of generalized abdominal pain include but are not limited to AAA, mesenteric ischemia, appendicitis, diverticulitis, DKA, gastritis, gastroenteritis, AMI, nephrolithiasis, pancreatitis, peritonitis, adrenal insufficiency,lead poisoning, iron toxicity, intestinal ischemia, constipation, UTI,SBO/LBO, splenic rupture, biliary disease, IBD, IBS, PUD, or hepatitis. This is not an exhaustive differential.   Past Medical History / Co-morbidities / Social History: GERD, obesity,  hypertension, diabetes, anemia  Additional history: Chart reviewed. Pertinent results include: reviewed labwork, imaging from previous emergency department visits  Physical Exam: Physical exam performed. The pertinent findings include: Mild tenderness to palpation in the left upper quadrant, no rebound, rigidity, guarding, no epigastric tenderness, no right upper quadrant tenderness.  No abdominal distention.  Normal bowel sounds throughout.  Mild tachycardia, pulse 103, resolved on repeat evaluation.  Lab Tests/Imaging studies: I personally interpreted labs/imaging and the pertinent results include: CBC unremarkable, lipase mild elevated at 60, not 3 times upper limit of normal, very low clinical suspicion for early pancreatitis based on location of pain CMP overall unremarkable other than glucose 107.  UA unremarkable, some small leukocytes but no bacteria, no nitrites.  I independently interpreted CT renal stone study which shows stable left-sided ovarian mass from MRI in 2023, with differential including  2. 5.7 cm mass along the posterior left uterus is unchanged compared  to MRI 11/20/2021. Per that report differential considerations  include benign ovarian fibroma, fibrothecoma, Brenner's tumor, or  pedunculated fibroid.  I agree with the radiologist interpretation.    Medications: I ordered medication including percocet for  pain.  I have reviewed the patients home medicines and have made adjustments as needed.   Disposition: After consideration of the diagnostic results and the patients response to treatment, I feel that patient stable for discharge, stable for her shoulder surgery on Thursday as planned.   emergency department workup does not suggest an emergent condition requiring admission or immediate intervention beyond what has been performed at this time. The plan is: as above. The patient is safe for discharge and has been instructed to return immediately for worsening symptoms, change in symptoms or any other concerns.   Final diagnoses:  Left flank pain    ED Discharge Orders     None          Rosan Sherlean DEL, PA-C 10/31/23 2018    Neysa Caron PARAS, DO 10/31/23 2322

## 2023-10-31 NOTE — ED Triage Notes (Signed)
 C/o left sided abd pain x 3 days. States worse with movement.

## 2023-10-31 NOTE — ED Notes (Signed)
 Pt d/c instructions, medications, and follow-up care reviewed with pt. Pt verbalized understanding and had no further questions at time of d/c. Pt CA&Ox4, ambulatory, and in NAD at time of d/c

## 2023-10-31 NOTE — Discharge Instructions (Addendum)
 Please use Tylenol  or ibuprofen  for pain.  You may use 600 mg ibuprofen  every 6 hours or 1000 mg of Tylenol  every 6 hours.  You may choose to alternate between the 2.  This would be most effective.  Not to exceed 4 g of Tylenol  within 24 hours.  Not to exceed 3200 mg ibuprofen  24 hours.  The only thing noted on the CT scan was the stable size mass on the uterus on the left that was seen on MRI in 2023.  This appears unchanged from prior, it is difficult to say whether this could be contributing to your pain versus as we discussed having something related to the stomach or muscle spasm on the left.  Your workup was otherwise very reassuring.  Please return if you have significantly worsening pain despite treatment. You should be stable for your shoulder surgery on Thursday based on what was found today.

## 2023-11-01 ENCOUNTER — Other Ambulatory Visit: Payer: Self-pay | Admitting: Student

## 2023-11-01 DIAGNOSIS — M545 Low back pain, unspecified: Secondary | ICD-10-CM

## 2023-11-02 NOTE — Anesthesia Preprocedure Evaluation (Addendum)
 Anesthesia Evaluation  Patient identified by MRN, date of birth, ID band Patient awake    Reviewed: Allergy & Precautions, H&P , NPO status , Patient's Chart, lab work & pertinent test results  Airway Mallampati: II  TM Distance: >3 FB Neck ROM: Full    Dental no notable dental hx. (+) Partial Lower, Teeth Intact, Dental Advisory Given   Pulmonary neg pulmonary ROS   Pulmonary exam normal breath sounds clear to auscultation       Cardiovascular hypertension, Pt. on medications and Pt. on home beta blockers + dysrhythmias Atrial Fibrillation  Rhythm:Regular Rate:Normal     Neuro/Psych   Anxiety Depression    negative neurological ROS     GI/Hepatic Neg liver ROS,GERD  Medicated,,  Endo/Other  diabetes    Renal/GU negative Renal ROS  negative genitourinary   Musculoskeletal  (+) Arthritis , Osteoarthritis,    Abdominal   Peds  Hematology  (+) Blood dyscrasia, anemia   Anesthesia Other Findings   Reproductive/Obstetrics negative OB ROS                              Anesthesia Physical Anesthesia Plan  ASA: 2  Anesthesia Plan: General   Post-op Pain Management: Regional block* and Tylenol  PO (pre-op)*   Induction: Intravenous  PONV Risk Score and Plan: 4 or greater and Ondansetron , Dexamethasone  and Midazolam   Airway Management Planned: Oral ETT  Additional Equipment:   Intra-op Plan:   Post-operative Plan: Extubation in OR  Informed Consent: I have reviewed the patients History and Physical, chart, labs and discussed the procedure including the risks, benefits and alternatives for the proposed anesthesia with the patient or authorized representative who has indicated his/her understanding and acceptance.     Dental advisory given  Plan Discussed with: CRNA and Surgeon  Anesthesia Plan Comments: (See PAT note 10/12/2023)         Anesthesia Quick Evaluation

## 2023-11-03 ENCOUNTER — Other Ambulatory Visit: Payer: Self-pay

## 2023-11-03 ENCOUNTER — Ambulatory Visit (HOSPITAL_COMMUNITY)
Admission: RE | Admit: 2023-11-03 | Discharge: 2023-11-03 | Disposition: A | Discharge status: Home / Self Care | Source: Ambulatory Visit | Attending: Orthopedic Surgery | Admitting: Orthopedic Surgery

## 2023-11-03 ENCOUNTER — Encounter (HOSPITAL_COMMUNITY): Payer: Self-pay | Admitting: Orthopedic Surgery

## 2023-11-03 ENCOUNTER — Ambulatory Visit (HOSPITAL_COMMUNITY): Payer: Self-pay | Admitting: Physician Assistant

## 2023-11-03 ENCOUNTER — Encounter (HOSPITAL_COMMUNITY)
Admission: RE | Disposition: A | Discharge status: Home / Self Care | Payer: Self-pay | Source: Ambulatory Visit | Attending: Orthopedic Surgery

## 2023-11-03 DIAGNOSIS — D649 Anemia, unspecified: Secondary | ICD-10-CM | POA: Diagnosis not present

## 2023-11-03 DIAGNOSIS — Z8249 Family history of ischemic heart disease and other diseases of the circulatory system: Secondary | ICD-10-CM | POA: Diagnosis not present

## 2023-11-03 DIAGNOSIS — Z79899 Other long term (current) drug therapy: Secondary | ICD-10-CM | POA: Diagnosis not present

## 2023-11-03 DIAGNOSIS — E119 Type 2 diabetes mellitus without complications: Secondary | ICD-10-CM | POA: Insufficient documentation

## 2023-11-03 DIAGNOSIS — M19011 Primary osteoarthritis, right shoulder: Secondary | ICD-10-CM | POA: Diagnosis present

## 2023-11-03 DIAGNOSIS — Z833 Family history of diabetes mellitus: Secondary | ICD-10-CM | POA: Diagnosis not present

## 2023-11-03 DIAGNOSIS — Z7985 Long-term (current) use of injectable non-insulin antidiabetic drugs: Secondary | ICD-10-CM | POA: Diagnosis not present

## 2023-11-03 DIAGNOSIS — K219 Gastro-esophageal reflux disease without esophagitis: Secondary | ICD-10-CM | POA: Insufficient documentation

## 2023-11-03 DIAGNOSIS — I10 Essential (primary) hypertension: Secondary | ICD-10-CM | POA: Diagnosis not present

## 2023-11-03 DIAGNOSIS — M12811 Other specific arthropathies, not elsewhere classified, right shoulder: Secondary | ICD-10-CM | POA: Diagnosis not present

## 2023-11-03 DIAGNOSIS — I4891 Unspecified atrial fibrillation: Secondary | ICD-10-CM | POA: Insufficient documentation

## 2023-11-03 DIAGNOSIS — M75101 Unspecified rotator cuff tear or rupture of right shoulder, not specified as traumatic: Secondary | ICD-10-CM

## 2023-11-03 DIAGNOSIS — F418 Other specified anxiety disorders: Secondary | ICD-10-CM | POA: Insufficient documentation

## 2023-11-03 HISTORY — PX: REVERSE SHOULDER ARTHROPLASTY: SHX5054

## 2023-11-03 LAB — GLUCOSE, CAPILLARY: Glucose-Capillary: 115 mg/dL — ABNORMAL HIGH (ref 70–99)

## 2023-11-03 SURGERY — ARTHROPLASTY, SHOULDER, TOTAL, REVERSE
Anesthesia: General | Site: Shoulder | Laterality: Right

## 2023-11-03 MED ORDER — PHENYLEPHRINE 80 MCG/ML (10ML) SYRINGE FOR IV PUSH (FOR BLOOD PRESSURE SUPPORT)
PREFILLED_SYRINGE | INTRAVENOUS | Status: DC | PRN
  Administered 2023-11-03: 40 ug via INTRAVENOUS
  Administered 2023-11-03: 120 ug via INTRAVENOUS

## 2023-11-03 MED ORDER — SUGAMMADEX SODIUM 200 MG/2ML IV SOLN
INTRAVENOUS | Status: DC | PRN
  Administered 2023-11-03: 200 mg via INTRAVENOUS

## 2023-11-03 MED ORDER — OXYCODONE-ACETAMINOPHEN 10-325 MG PO TABS: 1.0000 | ORAL_TABLET | ORAL | Status: AC | PRN

## 2023-11-03 MED ORDER — METOPROLOL TARTRATE 5 MG/5ML IV SOLN
INTRAVENOUS | Status: AC
Start: 2023-11-03 — End: 2023-11-03
  Filled 2023-11-03: qty 5

## 2023-11-03 MED ORDER — MIDAZOLAM HCL 5 MG/5ML IJ SOLN
INTRAMUSCULAR | Status: DC | PRN
  Administered 2023-11-03 (×2): 2 mg via INTRAVENOUS

## 2023-11-03 MED ORDER — ONDANSETRON HCL 4 MG/2ML IJ SOLN
INTRAMUSCULAR | Status: DC | PRN
  Administered 2023-11-03: 4 mg via INTRAVENOUS

## 2023-11-03 MED ORDER — CHLORHEXIDINE GLUCONATE 0.12 % MT SOLN
15.0000 mL | Freq: Once | OROMUCOSAL | Status: AC
  Administered 2023-11-03: 15 mL via OROMUCOSAL

## 2023-11-03 MED ORDER — METOPROLOL TARTRATE 5 MG/5ML IV SOLN
INTRAVENOUS | Status: DC | PRN
  Administered 2023-11-03: 2.5 mg via INTRAVENOUS

## 2023-11-03 MED ORDER — PHENYLEPHRINE 80 MCG/ML (10ML) SYRINGE FOR IV PUSH (FOR BLOOD PRESSURE SUPPORT)
PREFILLED_SYRINGE | INTRAVENOUS | Status: AC
  Filled 2023-11-03: qty 10

## 2023-11-03 MED ORDER — ROCURONIUM BROMIDE 10 MG/ML (PF) SYRINGE
PREFILLED_SYRINGE | INTRAVENOUS | Status: AC
  Filled 2023-11-03: qty 10

## 2023-11-03 MED ORDER — HYDROMORPHONE HCL 1 MG/ML IJ SOLN: 0.2500 mg | INTRAMUSCULAR | Status: DC | PRN

## 2023-11-03 MED ORDER — BUPIVACAINE LIPOSOME 1.3 % IJ SUSP
INTRAMUSCULAR | Status: DC | PRN
Start: 2023-11-03 — End: 2023-11-03
  Administered 2023-11-03: 10 mL via PERINEURAL

## 2023-11-03 MED ORDER — FENTANYL CITRATE (PF) 100 MCG/2ML IJ SOLN
INTRAMUSCULAR | Status: DC | PRN
  Administered 2023-11-03 (×2): 50 ug via INTRAVENOUS

## 2023-11-03 MED ORDER — CEFAZOLIN SODIUM-DEXTROSE 2-4 GM/100ML-% IV SOLN
2.0000 g | INTRAVENOUS | Status: AC
  Administered 2023-11-03: 2 g via INTRAVENOUS
  Filled 2023-11-03: qty 100

## 2023-11-03 MED ORDER — DEXAMETHASONE SODIUM PHOSPHATE 10 MG/ML IJ SOLN
INTRAMUSCULAR | Status: DC | PRN
  Administered 2023-11-03: 4 mg via INTRAVENOUS

## 2023-11-03 MED ORDER — ORAL CARE MOUTH RINSE: 15.0000 mL | Freq: Once | OROMUCOSAL | Status: AC

## 2023-11-03 MED ORDER — ONDANSETRON HCL 4 MG/2ML IJ SOLN
INTRAMUSCULAR | Status: AC
  Filled 2023-11-03: qty 2

## 2023-11-03 MED ORDER — PROPOFOL 10 MG/ML IV BOLUS
INTRAVENOUS | Status: DC | PRN
  Administered 2023-11-03: 130 mg via INTRAVENOUS

## 2023-11-03 MED ORDER — MIDAZOLAM HCL 2 MG/2ML IJ SOLN
INTRAMUSCULAR | Status: AC
  Filled 2023-11-03: qty 2

## 2023-11-03 MED ORDER — SODIUM CHLORIDE 0.9 % IR SOLN
Status: DC | PRN
  Administered 2023-11-03: 1000 mL

## 2023-11-03 MED ORDER — DEXAMETHASONE SODIUM PHOSPHATE 10 MG/ML IJ SOLN
INTRAMUSCULAR | Status: AC
  Filled 2023-11-03: qty 1

## 2023-11-03 MED ORDER — LACTATED RINGERS IV SOLN: INTRAVENOUS | Status: DC

## 2023-11-03 MED ORDER — BUPIVACAINE-EPINEPHRINE (PF) 0.5% -1:200000 IJ SOLN
INTRAMUSCULAR | Status: DC | PRN
  Administered 2023-11-03: 15 mL via PERINEURAL

## 2023-11-03 MED ORDER — PHENYLEPHRINE HCL-NACL 20-0.9 MG/250ML-% IV SOLN
INTRAVENOUS | Status: DC | PRN
  Administered 2023-11-03: 40 ug/min via INTRAVENOUS

## 2023-11-03 MED ORDER — PHENYLEPHRINE HCL (PRESSORS) 10 MG/ML IV SOLN
INTRAVENOUS | Status: AC
Start: 2023-11-03 — End: 2023-11-03
  Filled 2023-11-03: qty 1

## 2023-11-03 MED ORDER — PROPOFOL 10 MG/ML IV BOLUS
INTRAVENOUS | Status: AC
  Filled 2023-11-03: qty 20

## 2023-11-03 MED ORDER — LIDOCAINE 2% (20 MG/ML) 5 ML SYRINGE
INTRAMUSCULAR | Status: DC | PRN
  Administered 2023-11-03: 60 mg via INTRAVENOUS

## 2023-11-03 MED ORDER — POVIDONE-IODINE 7.5 % EX SOLN: Freq: Once | CUTANEOUS | Status: DC

## 2023-11-03 MED ORDER — PHENYLEPHRINE HCL (PRESSORS) 10 MG/ML IV SOLN
INTRAVENOUS | Status: AC
  Filled 2023-11-03: qty 1

## 2023-11-03 MED ORDER — ROCURONIUM BROMIDE 10 MG/ML (PF) SYRINGE
PREFILLED_SYRINGE | INTRAVENOUS | Status: DC | PRN
Start: 2023-11-03 — End: 2023-11-03
  Administered 2023-11-03: 50 mg via INTRAVENOUS

## 2023-11-03 MED ORDER — TRANEXAMIC ACID-NACL 1000-0.7 MG/100ML-% IV SOLN
1000.0000 mg | INTRAVENOUS | Status: AC
  Administered 2023-11-03: 1000 mg via INTRAVENOUS
  Filled 2023-11-03: qty 100

## 2023-11-03 MED ORDER — FENTANYL CITRATE (PF) 100 MCG/2ML IJ SOLN
INTRAMUSCULAR | Status: AC
Start: 2023-11-03 — End: 2023-11-03
  Filled 2023-11-03: qty 2

## 2023-11-03 MED ORDER — ACETAMINOPHEN 500 MG PO TABS
1000.0000 mg | ORAL_TABLET | Freq: Once | ORAL | Status: AC
  Administered 2023-11-03: 1000 mg via ORAL
  Filled 2023-11-03: qty 2

## 2023-11-03 MED ORDER — WATER FOR IRRIGATION, STERILE IR SOLN
Status: DC | PRN
  Administered 2023-11-03: 2000 mL

## 2023-11-03 SURGICAL SUPPLY — 63 items
BAG COUNTER SPONGE SURGICOUNT (BAG) IMPLANT
BAG ZIPLOCK 12X15 (MISCELLANEOUS) ×2 IMPLANT
BASEPLATE P2 COATD GLND 6.5X30 (Shoulder) IMPLANT
BIT DRILL 2.5 DIA 127 CALI (BIT) IMPLANT
BIT DRILL 4 DIA CALIBRATED (BIT) IMPLANT
BLADE SAW SGTL 73X25 THK (BLADE) ×2 IMPLANT
BOOTIES KNEE HIGH SLOAN (MISCELLANEOUS) ×4 IMPLANT
COOLER ICEMAN CLASSIC (MISCELLANEOUS) ×2 IMPLANT
COVER BACK TABLE 60X90IN (DRAPES) ×2 IMPLANT
COVER SURGICAL LIGHT HANDLE (MISCELLANEOUS) ×2 IMPLANT
DRAPE INCISE IOBAN 66X45 STRL (DRAPES) ×2 IMPLANT
DRAPE POUCH INSTRU U-SHP 10X18 (DRAPES) ×2 IMPLANT
DRAPE SHEET LG 3/4 BI-LAMINATE (DRAPES) ×2 IMPLANT
DRAPE SURG 17X11 SM STRL (DRAPES) ×2 IMPLANT
DRAPE SURG ORHT 6 SPLT 77X108 (DRAPES) ×4 IMPLANT
DRAPE TOP 10253 STERILE (DRAPES) ×2 IMPLANT
DRAPE U-SHAPE 47X51 STRL (DRAPES) ×2 IMPLANT
DRSG AQUACEL AG ADV 3.5X 6 (GAUZE/BANDAGES/DRESSINGS) ×2 IMPLANT
DURAPREP 26ML APPLICATOR (WOUND CARE) ×4 IMPLANT
ELECT BLADE TIP CTD 4 INCH (ELECTRODE) ×2 IMPLANT
ELECT PENCIL ROCKER SW 15FT (MISCELLANEOUS) ×2 IMPLANT
ELECT REM PT RETURN 15FT ADLT (MISCELLANEOUS) ×2 IMPLANT
FACESHIELD WRAPAROUND (MASK) ×1 IMPLANT
FACESHIELD WRAPAROUND OR TEAM (MASK) ×2 IMPLANT
GLOVE BIO SURGEON STRL SZ7.5 (GLOVE) ×2 IMPLANT
GLOVE BIOGEL PI IND STRL 6.5 (GLOVE) ×2 IMPLANT
GLOVE BIOGEL PI IND STRL 8 (GLOVE) ×2 IMPLANT
GLOVE SURG SS PI 6.5 STRL IVOR (GLOVE) ×2 IMPLANT
GOWN STRL REUS W/ TWL LRG LVL3 (GOWN DISPOSABLE) ×2 IMPLANT
GOWN STRL REUS W/ TWL XL LVL3 (GOWN DISPOSABLE) ×2 IMPLANT
HOOD PEEL AWAY T7 (MISCELLANEOUS) ×6 IMPLANT
INSERT SMALL SOCKET 32MM NEU (Insert) IMPLANT
KIT BASIN OR (CUSTOM PROCEDURE TRAY) ×2 IMPLANT
KIT TURNOVER KIT A (KITS) ×2 IMPLANT
MANIFOLD NEPTUNE II (INSTRUMENTS) ×2 IMPLANT
NDL TROCAR POINT SZ 2 1/2 (NEEDLE) IMPLANT
NEEDLE TROCAR POINT SZ 2 1/2 (NEEDLE) IMPLANT
NS IRRIG 1000ML POUR BTL (IV SOLUTION) ×2 IMPLANT
PACK SHOULDER (CUSTOM PROCEDURE TRAY) ×2 IMPLANT
PAD COLD SHLDR WRAP-ON (PAD) ×2 IMPLANT
RESTRAINT HEAD UNIVERSAL NS (MISCELLANEOUS) ×2 IMPLANT
RETRIEVER SUT HEWSON (MISCELLANEOUS) IMPLANT
SCREW BONE RSP LOCK 5X14 (Screw) IMPLANT
SCREW BONE RSP LOCK 5X30 (Screw) IMPLANT
SCREW BONE RSP LOCK 5X34 (Screw) IMPLANT
SCREW RETAIN W/HEAD 4MM OFFSET (Shoulder) IMPLANT
SET HNDPC FAN SPRY TIP SCT (DISPOSABLE) ×2 IMPLANT
SLING ARM FOAM STRAP LRG (SOFTGOODS) IMPLANT
SLING ARM FOAM STRAP MED (SOFTGOODS) IMPLANT
STEM HUMERAL SM SHELL SHOU 10 (Miscellaneous) IMPLANT
STRIP CLOSURE SKIN 1/2X4 (GAUZE/BANDAGES/DRESSINGS) ×2 IMPLANT
SUCTION TUBE FRAZIER 10FR DISP (SUCTIONS) IMPLANT
SUPPORT WRAP ARM LG (MISCELLANEOUS) ×2 IMPLANT
SUT ETHIBOND 2 V 37 (SUTURE) IMPLANT
SUT MNCRL AB 4-0 PS2 18 (SUTURE) ×2 IMPLANT
SUT VIC AB 2-0 CT1 TAPERPNT 27 (SUTURE) ×4 IMPLANT
SUTURE FIBERWR#2 38 REV NDL BL (SUTURE) IMPLANT
TAP SURG THRD DJ 6.5 (ORTHOPEDIC DISPOSABLE SUPPLIES) IMPLANT
TAPE LABRALWHITE 1.5X36 (TAPE) IMPLANT
TAPE SUT LABRALTAP WHT/BLK (SUTURE) IMPLANT
TOWEL OR 17X26 10 PK STRL BLUE (TOWEL DISPOSABLE) ×2 IMPLANT
TUBE SUCTION HIGH CAP CLEAR NV (SUCTIONS) ×2 IMPLANT
WATER STERILE IRR 1000ML POUR (IV SOLUTION) ×2 IMPLANT

## 2023-11-03 NOTE — Discharge Instructions (Addendum)
 Discharge Instructions after Reverse Total Shoulder Arthroplasty   A sling has been provided for you. You are to wear this at all times (except for bathing and dressing), until your first post operative visit with Dr. Dozier. Please also wear while sleeping at night. While you bath and dress, let the arm/elbow extend straight down to stretch your elbow. Wiggle your fingers and pump your first while your in the sling to prevent hand swelling. Use ice on the shoulder intermittently over the first 48 hours after surgery. Continue to use ice or and ice machine as needed after 48 hours for pain control/swelling.  Continue pain medication per pain mgmt. You reported taking Percocet 3 times daily. Rx from pain mgmt allows for up to every 8 hrs as needed for pain. Use your medicine liberally over the first 48 hours, and then you can begin to taper your use. You may take Extra Strength Tylenol  or Tylenol  only in place of the pain pills. DO NOT take ANY nonsteroidal anti-inflammatory pain medications: Advil , Motrin , Ibuprofen , Aleve , Naproxen  or Naprosyn .  Take one aspirin  a day for 2 weeks after surgery, unless you have an aspirin  sensitivity/allergy or asthma.  Leave your dressing on until your first follow up visit.  You may shower with the dressing.  Hold your arm as if you still have your sling on while you shower. Simply allow the water  to wash over the site and then pat dry. Make sure your axilla (armpit) is completely dry after showering.    Please call [phone] during normal business hours or [phone] after hours for any problems. Including the following:  - excessive redness of the incisions - drainage for more than 4 days - fever of more than 101.5 F  *Please note that pain medications will not be refilled after hours or on weekends.  Dental Antibiotics:  In most cases prophylactic antibiotics for Dental procdeures after total joint surgery are not necessary.  Exceptions are as  follows:  1. History of prior total joint infection  2. Severely immunocompromised (Organ Transplant, cancer chemotherapy, Rheumatoid biologic meds such as Humera)  3. Poorly controlled diabetes (A1C &gt; 8.0, blood glucose over 200)  If you have one of these conditions, contact your surgeon for an antibiotic prescription, prior to your dental procedure.

## 2023-11-03 NOTE — Anesthesia Procedure Notes (Signed)
 Anesthesia Regional Block: Interscalene brachial plexus block   Pre-Anesthetic Checklist: , timeout performed,  Correct Patient, Correct Site, Correct Laterality,  Correct Procedure, Correct Position, site marked,  Risks and benefits discussed,  Pre-op evaluation,  At surgeon's request and post-op pain management  Laterality: Right  Prep: Maximum Sterile Barrier Precautions used, chloraprep       Needles:  Injection technique: Single-shot  Needle Type: Echogenic Stimulator Needle     Needle Length: 5cm  Needle Gauge: 22     Additional Needles:   Procedures:,,,, ultrasound used (permanent image in chart),,    Narrative:  Start time: 11/03/2023 6:52 AM End time: 11/03/2023 7:02 AM Injection made incrementally with aspirations every 5 mL.  Performed by: Personally  Anesthesiologist: Epifanio Fallow, MD

## 2023-11-03 NOTE — Op Note (Signed)
 Procedure(s): ARTHROPLASTY, SHOULDER, TOTAL, REVERSE Procedure Note  ENID MAULTSBY female 63 y.o. 11/03/2023  Preoperative diagnosis: Right shoulder recurrent irreparable rotator cuff tear with early arthropathy  Postoperative diagnosis: Same  Procedure(s) and Anesthesia Type:    * ARTHROPLASTY, SHOULDER, TOTAL, REVERSE - Choice   Indications:  63 y.o. female  With endstage right shoulder irrepairable rotator cuff tear and early arthropathy. Pain and dysfunction interfered with quality of life and nonoperative treatment with activity modification, NSAIDS and injections failed.     Surgeon: Josefa LELON Herring   Assistants: Jeoffrey Northern PA-C Amber was present and scrubbed throughout the procedure and was essential in positioning, retraction, exposure, and closure)  Anesthesia: General endotracheal anesthesia with preoperative interscalene block given by the attending anesthesiologist    Procedure Detail  ARTHROPLASTY, SHOULDER, TOTAL, REVERSE   Estimated Blood Loss:  200 mL         Drains: none  Blood Given: none          Specimens: none        Complications:  * No complications entered in OR log *         Disposition: PACU - hemodynamically stable.         Condition: stable      OPERATIVE FINDINGS:  A DJO Altivate pressfit reverse total shoulder arthroplasty was placed with a  size 10 stem, a 32-4 glenosphere, and a standard-mm poly insert. The base plate  fixation was excellent.  PROCEDURE: The patient was identified in the preoperative holding area  where I personally marked the operative site after verifying site, side,  and procedure with the patient. An interscalene block given by  the attending anesthesiologist in the holding area and the patient was taken back to the operating room where all extremities were  carefully padded in position after general anesthesia was induced. She  was placed in a beach-chair position and the operative upper extremity  was  prepped and draped in a standard sterile fashion. An approximately 10-  cm incision was made from the tip of the coracoid process to the center  point of the humerus at the level of the axilla. Dissection was carried  down through subcutaneous tissues to the level of the cephalic vein  which was taken laterally with the deltoid. The pectoralis major was  retracted medially. The subdeltoid space was developed and the lateral  edge of the conjoined tendon was identified. The undersurface of  conjoined tendon was palpated and the musculocutaneous nerve was not in  the field. Retractor was placed underneath the conjoined and second  retractor was placed lateral into the deltoid. The circumflex humeral  artery and vessels were identified and clamped and coagulated. The  biceps tendon was absent.  The subscapularis was severely thinned and degenerated and taken down as a peel.  The  joint was then gently externally rotated while the capsule was released  from the humeral neck around to just beyond the 6 o'clock position. At  this point, the joint was dislocated and the humeral head was presented  into the wound. The excessive osteophyte formation was removed with a  large rongeur.  The cutting guide was used to make the appropriate  head cut and the head was saved for potentially bone grafting.  The glenoid was exposed with the arm in an  abducted extended position. The anterior and posterior labrum were  completely excised and the capsule was released circumferentially to  allow for exposure of the glenoid for preparation.  The 2.5 mm drill was  placed using the guide in 5-10 inferior angulation and the tap was then advanced in the same hole. Small and large reamers were then used. The tap was then removed and the Metaglene was then screwed in with excellent purchase.  The peripheral guide was then used to drilled measured and filled peripheral locking screws. The size 32-4 glenosphere was then  impacted on the [facility] taper and the central screw was placed. The humerus was then again exposed and the diaphyseal reamers were used followed by the metaphyseal reamers. The final broach was left in place in the proximal trial was placed. The joint was reduced and with this implant it was felt that soft tissue tensioning was appropriate with excellent stability and excellent range of motion. Therefore, final humeral stem was placed press-fit.  And then the trial polyethylene inserts were tested again and the above implant was felt to be the most appropriate for final insertion. The joint was reduced taken through full range of motion and felt to be stable. Soft tissue tension was appropriate.  The joint was then copiously irrigated with pulse  lavage and the wound was then closed. The subscapularis was not repairable.  Skin was closed with 2-0 Vicryl in a deep dermal layer and 4-0  Monocryl for skin closure. Steri-Strips were applied. Sterile  dressings were then applied as well as a sling. The patient was allowed  to awaken from general anesthesia, transferred to stretcher, and taken  to recovery room in stable condition.   POSTOPERATIVE PLAN: The patient will be observed in the recovery room and if her pain is well-controlled with regional anesthesia and she is hemodynamically stable she can be discharged home today with family.

## 2023-11-03 NOTE — Progress Notes (Signed)
 OT Cancellation Note  Patient Details Name: Norma [name] MRN: [id number] DOB: 10-15-1960   Cancelled Treatment:    Reason Eval/Treat Not Completed: Patient declined, no reason specified. Per nurse, the pt does not desire an OT evaluation. OT will sign off and complete order.    Delanna LITTIE Molt, OTR/L 11/03/2023, 10:01 AM

## 2023-11-03 NOTE — Anesthesia Procedure Notes (Signed)
 Procedure Name: Intubation Date/Time: 11/03/2023 7:29 AM  Performed by: Franchot Delon RAMAN, CRNAPre-anesthesia Checklist: Patient identified, Emergency Drugs available, Suction available and Patient being monitored Patient Re-evaluated:Patient Re-evaluated prior to induction Oxygen Delivery Method: Circle System Utilized Preoxygenation: Pre-oxygenation with 100% oxygen Induction Type: IV induction Ventilation: Mask ventilation without difficulty Laryngoscope Size: Mac and 3 Grade View: Grade I Tube type: Oral Tube size: 7.0 mm Number of attempts: 1 Airway Equipment and Method: Stylet Placement Confirmation: ETT inserted through vocal cords under direct vision, positive ETCO2 and breath sounds checked- equal and bilateral Secured at: 22 cm Tube secured with: Tape Dental Injury: Teeth and Oropharynx as per pre-operative assessment

## 2023-11-03 NOTE — H&P (Signed)
 Norma [name] is an 63 y.o. female.   Chief Complaint: R shoulder pain and dysfunction HPI: s/p prior R RCT with recurrent tear. Pain and dysfunction interfere quality of life and she has failed conservative treatment.  Past Medical History:  Diagnosis Date   A-fib (HCC)    Acid reflux    Allergy    Anemia    Anxiety    Arthritis    Back pain    Depression    Diabetes mellitus without complication (HCC)    on meds   Dysrhythmia    Fatty liver    Headache    stress headaches, migraines at times   Heart murmur    Hypertension    Irregular heart beat    Left shoulder pain    Shortness of breath dyspnea    with exertion   Tachyarrhythmia    Trigger point of thoracic region 03/22/2012    Past Surgical History:  Procedure Laterality Date   ANKLE SURGERY     CHOLECYSTECTOMY N/A 03/20/2020   Procedure: LAPAROSCOPIC CHOLECYSTECTOMY WITH INTRAOPERATIVE CHOLANGIOGRAM AND WEDGE LIVER BIOPSY;  Surgeon: Gladis Cough, MD;  Location: [facility];  Service: General;  Laterality: N/A;   KNEE SURGERY Left    LUMBAR LAMINECTOMY/DECOMPRESSION MICRODISCECTOMY Left 06/06/2014   Procedure: LUMBAR LAMINECTOMY/DECOMPRESSION MICRODISCECTOMY 1 LEVEL;  Surgeon: Reyes Budge, MD;  Location: [facility];  Service: Neurosurgery;  Laterality: Left;  Left L5S1 microdiskectomy   ROTATOR CUFF REPAIR Bilateral 02/20/2014   TUBAL LIGATION      Family History  Problem Relation Age of Onset   Diabetes Brother    Diabetes Maternal Aunt    Diabetes Paternal Aunt    Diabetes Maternal Grandmother    Diabetes Maternal Grandfather    Diabetes Paternal Grandmother    Diabetes Paternal Grandfather    Congestive Heart Failure Mother    Hypertension Son    Colon cancer Neg Hx    Esophageal cancer Neg Hx    Rectal cancer Neg Hx    Stomach cancer Neg Hx    Social History:  reports that she has never smoked. She has never been exposed to tobacco smoke. She has never used smokeless tobacco. She reports that  she does not currently use alcohol . She reports that she does not use drugs.  Allergies:  Allergies  Allergen Reactions   Soma  [Carisoprodol ] Itching    Itching/rash around mouth, itching back of throat.    Nortriptyline Rash   Prednisone  Rash    REACTION: rash and itching.     Medications Prior to Admission  Medication Sig Dispense Refill   albuterol (VENTOLIN HFA) 108 (90 Base) MCG/ACT inhaler Inhale 1-2 puffs into the lungs every 4 (four) hours as needed for wheezing or shortness of breath.     ALPRAZolam (XANAX) 0.25 MG tablet Take 0.25 mg by mouth 3 (three) times daily as needed for anxiety.     atorvastatin  (LIPITOR) 10 MG tablet Take 1 tablet (10 mg total) by mouth daily. 90 tablet 3   buPROPion  (WELLBUTRIN  SR) 200 MG 12 hr tablet Take 200 mg by mouth 2 (two) times daily.     cetirizine  (ZYRTEC  ALLERGY) 10 MG tablet Take 1 tablet (10 mg total) by mouth daily. (Patient taking differently: Take 10 mg by mouth daily as needed for allergies.) 30 tablet 0   cyclobenzaprine  (FLEXERIL ) 10 MG tablet Take 10 mg by mouth 4 (four) times daily as needed for muscle spasms.     diclofenac  Sodium (VOLTAREN ) 1 % GEL  Please apply 1-2 times daily to affected area (Patient taking differently: Apply 1 Application topically 2 (two) times daily as needed (joint pain).) 100 g 1   dicyclomine  (BENTYL ) 10 MG capsule Take 10 mg by mouth 3 (three) times daily as needed for spasms.     EPINEPHrine  0.3 mg/0.3 mL IJ SOAJ injection Inject into the muscle as directed.     ferrous gluconate  (FERGON) 324 MG tablet Take 1 tablet (324 mg total) by mouth daily with breakfast. 30 tablet 1   fluticasone  (FLONASE ) 50 MCG/ACT nasal spray Place 2 sprays into both nostrils daily. (Patient taking differently: Place 2 sprays into both nostrils daily as needed for allergies.) 16 g 6   hydrochlorothiazide  (HYDRODIURIL ) 50 MG tablet Take 50 mg by mouth daily.     lidocaine  (LIDODERM ) 5 % place ONE PATCH onto THE SKIN IN THE RIGHT  EAR. REMOVE AND discard WITH in 24 HOURS OR AS DIRECTED 30 patch 0   linaclotide (LINZESS) 290 MCG CAPS capsule Take 290 mcg by mouth daily before breakfast.     meloxicam  (MOBIC ) 15 MG tablet Take 15 mg by mouth daily.     metoprolol  succinate (TOPROL -XL) 25 MG 24 hr tablet Take 25 mg by mouth daily.     naloxone (NARCAN) nasal spray 4 mg/0.1 mL Place 1 spray into the nose once.     OLANZapine (ZYPREXA) 2.5 MG tablet Take 2.5 mg by mouth at bedtime.     olopatadine (PATANOL) 0.1 % ophthalmic solution Place 1 drop into both eyes 2 (two) times daily as needed for allergies.     omeprazole  (PRILOSEC) 20 MG capsule Take 2 capsules (40 mg total) by mouth daily. 90 capsule 2   oxyCODONE -acetaminophen  (PERCOCET) 10-325 MG tablet Take 1 tablet by mouth 3 (three) times daily.     RESTASIS 0.05 % ophthalmic emulsion Place 1 drop into both eyes 2 (two) times daily.     REZDIFFRA 100 MG TABS Take 1 tablet by mouth daily.     Semaglutide , 2 MG/DOSE, 8 MG/3ML SOPN Please inject 2 mg weekly. 9 mL 0   SYMBICORT 160-4.5 MCG/ACT inhaler Inhale 2 puffs into the lungs in the morning and at bedtime.     traZODone  (DESYREL ) 100 MG tablet TAKE 2 TABLETS BY MOUTH ONCE AT BEDTIME 180 tablet 3   Vitamin D , Ergocalciferol , (DRISDOL ) 50000 units CAPS capsule TAKE 1 CAPSULE BY MOUTH EVERY 7 (SEVEN) DAYS. 8 capsule 3   azelastine  (ASTELIN ) 0.1 % nasal spray Place 2 sprays into both nostrils 2 (two) times daily. Use in each nostril as directed (Patient taking differently: Place 2 sprays into both nostrils 2 (two) times daily as needed for rhinitis or allergies. Use in each nostril as directed) 30 mL 12   Blood Glucose Monitoring Suppl (ONE TOUCH ULTRA 2) w/Device KIT Use as directed 1 kit 0   gabapentin  (NEURONTIN ) 800 MG tablet Take 1 tablet (800 mg total) by mouth 4 (four) times daily. (Patient taking differently: Take 800 mg by mouth 2 (two) times daily.) 120 tablet 0   Lancets (ONETOUCH DELICA PLUS LANCET33G) MISC Use as  directed      No results found. However, due to the size of the patient record, not all encounters were searched. Please check Results Review for a complete set of results. No results found.  Review of Systems  All other systems reviewed and are negative.   Blood pressure 117/83, pulse 98, temperature 99.7 F (37.6 C), temperature source Oral, resp. rate 11,  height 5' 8 (1.727 m), weight 97.1 kg, SpO2 96%. Physical Exam HENT:     Head: Atraumatic.  Cardiovascular:     Pulses: Normal pulses.  Pulmonary:     Effort: Pulmonary effort is normal.  Musculoskeletal:     Comments: R shoulder pain with limited ROM. NVID.  Neurological:     Mental Status: She is alert.      Assessment/Plan s/p prior R RCT with recurrent tear. Pain and dysfunction interfere quality of life and she has failed conservative treatment Plan R reverse TSA Risks / benefits of surgery discussed Consent on chart  NPO for OR Preop antibiotics   Josefa LELON Herring, MD 11/03/2023, 6:27 AM

## 2023-11-03 NOTE — Anesthesia Postprocedure Evaluation (Signed)
 Anesthesia Post Note  Patient: Norma [name]  Procedure(s) Performed: ARTHROPLASTY, SHOULDER, TOTAL, REVERSE (Right: Shoulder)     Patient location during evaluation: PACU Anesthesia Type: General and Regional Level of consciousness: awake and alert Pain management: pain level controlled Vital Signs Assessment: post-procedure vital signs reviewed and stable Respiratory status: spontaneous breathing, nonlabored ventilation and respiratory function stable Cardiovascular status: blood pressure returned to baseline and stable Postop Assessment: no apparent nausea or vomiting Anesthetic complications: no   No notable events documented.  Last Vitals:  Vitals:   11/03/23 0915 11/03/23 0945  BP: 118/79 124/82  Pulse: 88 88  Resp: 14 15  Temp:  36.5 C  SpO2: 96% 96%    Last Pain:  Vitals:   11/03/23 0955  TempSrc:   PainSc: 0-No pain                 Mildred Bollard,W. EDMOND

## 2023-11-03 NOTE — Transfer of Care (Signed)
 Immediate Anesthesia Transfer of Care Note  Patient: Norma [name]  Procedure(s) Performed: ARTHROPLASTY, SHOULDER, TOTAL, REVERSE (Right: Shoulder)  Patient Location: PACU  Anesthesia Type:General  Level of Consciousness: awake, alert , oriented, and patient cooperative  Airway & Oxygen Therapy: Patient Spontanous Breathing and Patient connected to nasal cannula oxygen  Post-op Assessment: Report given to RN and Post -op Vital signs reviewed and stable  Post vital signs: Reviewed and stable  Last Vitals:  Vitals Value Taken Time  BP    Temp    Pulse    Resp    SpO2      Last Pain:  Vitals:   11/03/23 0551  TempSrc: Oral         Complications: No notable events documented.

## 2023-11-04 ENCOUNTER — Encounter (HOSPITAL_COMMUNITY): Payer: Self-pay | Admitting: Orthopedic Surgery

## 2023-11-25 ENCOUNTER — Other Ambulatory Visit: Payer: Self-pay | Admitting: Family Medicine

## 2023-11-25 ENCOUNTER — Ambulatory Visit

## 2023-11-25 DIAGNOSIS — M545 Low back pain, unspecified: Secondary | ICD-10-CM

## 2023-12-01 ENCOUNTER — Other Ambulatory Visit: Payer: Self-pay

## 2023-12-01 ENCOUNTER — Ambulatory Visit: Attending: Orthopedic Surgery | Admitting: Physical Therapy

## 2023-12-01 ENCOUNTER — Encounter: Payer: Self-pay | Admitting: Physical Therapy

## 2023-12-01 DIAGNOSIS — M25611 Stiffness of right shoulder, not elsewhere classified: Secondary | ICD-10-CM | POA: Diagnosis present

## 2023-12-01 DIAGNOSIS — M25511 Pain in right shoulder: Secondary | ICD-10-CM | POA: Insufficient documentation

## 2023-12-01 DIAGNOSIS — M6281 Muscle weakness (generalized): Secondary | ICD-10-CM | POA: Insufficient documentation

## 2023-12-01 NOTE — Therapy (Signed)
 OUTPATIENT PHYSICAL THERAPY UPPER EXTREMITY EVALUATION   Patient Name: Norma [name] MRN: [id number] DOB:02/02/1961, 63 y.o., female Today's Date: 12/01/2023  END OF SESSION:  PT End of Session - 12/01/23 1537     Visit Number 1    Number of Visits 17    Date for PT Re-Evaluation 01/26/24    PT Start Time 1448    PT Stop Time 1528    PT Time Calculation (min) 40 min    Activity Tolerance Patient tolerated treatment well    Behavior During Therapy WFL for tasks assessed/performed          Past Medical History:  Diagnosis Date   A-fib (HCC)    Acid reflux    Allergy    Anemia    Anxiety    Arthritis    Back pain    Depression    Diabetes mellitus without complication (HCC)    on meds   Dysrhythmia    Fatty liver    Headache    stress headaches, migraines at times   Heart murmur    Hypertension    Irregular heart beat    Left shoulder pain    Shortness of breath dyspnea    with exertion   Tachyarrhythmia    Trigger point of thoracic region 03/22/2012   Past Surgical History:  Procedure Laterality Date   ANKLE SURGERY     CHOLECYSTECTOMY N/A 03/20/2020   Procedure: LAPAROSCOPIC CHOLECYSTECTOMY WITH INTRAOPERATIVE CHOLANGIOGRAM AND WEDGE LIVER BIOPSY;  Surgeon: Gladis Cough, MD;  Location: [facility];  Service: General;  Laterality: N/A;   KNEE SURGERY Left    LUMBAR LAMINECTOMY/DECOMPRESSION MICRODISCECTOMY Left 06/06/2014   Procedure: LUMBAR LAMINECTOMY/DECOMPRESSION MICRODISCECTOMY 1 LEVEL;  Surgeon: Reyes Budge, MD;  Location: [facility];  Service: Neurosurgery;  Laterality: Left;  Left L5S1 microdiskectomy   REVERSE SHOULDER ARTHROPLASTY Right 11/03/2023   Procedure: ARTHROPLASTY, SHOULDER, TOTAL, REVERSE;  Surgeon: Dozier Soulier, MD;  Location: [facility];  Service: Orthopedics;  Laterality: Right;   ROTATOR CUFF REPAIR Bilateral 02/20/2014   TUBAL LIGATION     Patient Active Problem List   Diagnosis Date Noted   Left foot pain 05/17/2023    Elevated serum creatinine 05/17/2023   Epidermal inclusion cyst 12/06/2022   Viral syndrome 04/05/2022   Generalized abdominal cramping 11/27/2021   Tendinopathy of right rotator cuff 04/04/2020   HCV antibody positive 03/18/2020   RUQ pain 03/17/2020   Biliary colic symptom    Elevated liver enzymes    Abnormal findings on diagnostic imaging of liver and biliary tract    Abdominal pain 03/16/2020   Cholelithiasis 03/16/2020   Hypertensive urgency 03/16/2020   Type 2 diabetes mellitus (HCC) 03/16/2020   Lead exposure 11/14/2019   TMJ (temporomandibular joint disorder) 07/31/2019   Left hip pain 02/27/2019   Uterine mass 12/19/2018   Allergic rhinitis with postnasal drip 11/17/2018   Abdominal pain, right lower quadrant 06/23/2018   Diverticulosis of colon without hemorrhage 06/23/2018   Intractable migraine without aura and without status migrainosus 02/16/2018   Iron deficiency anemia 03/29/2017   Vitamin D  deficiency 03/01/2017   Lumbar herniated disc 06/06/2014   Depression    Spinal stenosis, lumbar region, with neurogenic claudication 11/21/2013   Hot flashes 06/30/2011   Eczema, dyshidrotic 06/02/2010   Leg cramps 05/06/2010   FIBROIDS, UTERUS 10/24/2009   GERD 06/09/2009   Chronic pain syndrome 10/04/2008   Hepatitis C carrier (HCC) 03/21/2007   Obesity, Class II, BMI 35-39.9 06/16/2006   HYPERTENSION, BENIGN  SYSTEMIC 06/16/2006    PCP: Dilan Fullenwider Bar, MD  REFERRING PROVIDER: Dozier Soulier, MD  REFERRING DIAG:  Free Text Diagnosis  S/P RIGHT KNEE TSA    Rationale for Evaluation and Treatment: Rehabilitation  THERAPY DIAG:  Right shoulder pain, unspecified chronicity  Stiffness of right shoulder, not elsewhere classified  Muscle weakness (generalized)  PERTINENT HISTORY: GERD, diverticulosis, DM2, HTN  WEIGHT BEARING RESTRICTIONS: Yes NW RUE  FALLS:  Has patient fallen in last 6 months? No  LIVING ENVIRONMENT: Lives with: lives alone Lives  in: House/apartment Stairs: No Has following equipment at home: Single point cane  OCCUPATION: Not currently   PRECAUTIONS: Shoulder no lifting more than 5 lb, no IR behind back ---------------------------------------------------------------------------------------------  SUBJECTIVE:                                                                                                                                                                                      SUBJECTIVE STATEMENT: Eval statement 12/01/2023: had R TSA on 11/03/2023 Has difficulties with lifting, bathing, and ADLs such as making the bed. Currently 8/10 pain, minimal swelling around surgical site, deep aching pain referred from surgery site to midline of arm   Hand dominance: Right  RED FLAGS: None   PLOF: Independent  PATIENT GOALS: be able to lift the shoulder  NEXT MD VISIT: September 1st with surgeon ---------------------------------------------------------------------------------------------  OBJECTIVE:  Note: Objective measures were completed at Evaluation unless otherwise noted.  DIAGNOSTIC FINDINGS:  N/a  PATIENT SURVEYS :  Quick dash: ~80%  COGNITION: Overall cognitive status: Within functional limits for tasks assessed     SENSATION: WFL  POSTURE: Forward head, increased kyphosis  UPPER EXTREMITY ROM:   Passive ROM Right eval Left eval  Shoulder flexion 80d WFL  Shoulder extension  WFL  Shoulder abduction 80d WFL  Shoulder adduction    Shoulder internal rotation 70d WFL  Shoulder external rotation 10d WFL  Elbow flexion    Elbow extension    Wrist flexion    Wrist extension    Wrist ulnar deviation    Wrist radial deviation    Wrist pronation    Wrist supination    (Blank rows = not tested)  UPPER EXTREMITY [id number] d/t pain and recent surgery  MMT Right eval Left eval  Shoulder flexion    Shoulder extension    Shoulder abduction    Shoulder adduction     Shoulder internal rotation    Shoulder external rotation    Middle trapezius    Lower trapezius    Elbow flexion    Elbow extension    Wrist flexion    Wrist extension    Wrist ulnar deviation  Wrist radial deviation    Wrist pronation    Wrist supination    Grip strength (lbs)    (Blank rows = not tested)  SHOULDER SPECIAL TESTS: deferred  JOINT MOBILITY TESTING:  deferred  PALPATION:  Increased sensitivity around surgical site                                             [facility] Adult PT Treatment:                                                DATE: 12/01/2023 Self Care: POC discussion Pt education   PATIENT EDUCATION: Education details: Pt received education regarding HEP performance, ADL performance, functional activity tolerance, impairment education, appropriate performance of therapeutic activities. Person educated: Patient Education method: Explanation, Demonstration, Tactile cues, Verbal cues, and Handouts Education comprehension: verbalized understanding and returned demonstration  HOME EXERCISE PROGRAM: Access Code: [email or web address] URL: [email or web address] Date: 12/01/2023 Prepared by: Mabel Kiang  Exercises - Supine Shoulder Flexion AAROM with Hands Clasped  - 1-3 x daily - 7 x weekly - 2 sets - 15 reps - 1s hold - Supine Shoulder External Rotation with Dowel  - 1-2 x daily - 7 x weekly - 1-2 sets - 12 reps - 2s hold - Supine Shoulder Abduction AAROM with Dowel  - 1-2 x daily - 7 x weekly - 1-2 sets - 12 reps - 2s hold - Seated Scapular Retraction  - 1-3 x daily - 7 x weekly - 2-3 sets - 10 reps - 5s hold ---------------------------------------------------------------------------------------------  ASSESSMENT:  CLINICAL IMPRESSION: Eval impression (12/01/2023): Pt. attended today's physical therapy session for evaluation of R TSA. Pt had a R TSA on 11/03/2023, putting her 4 weeks out as of today 12/01/2023. Pt has notable deficits with shoulder  mobility, strength, stability, and pain levels. Pt would benefit from therapeutic focus on P/AAROM and periscapular strengthening in line with surgical protocol. Protocol is written out on the 2nd page of the 11/23/2023 referral seen in the media tab on EPIC.  Treatment performed today focused on pt education detailed in the objective. Pt demonstrated great understanding of education provided. required minimal v/t cues and no assistance for appropriate performance with today's activities.  Pts next protocol stage will be s/p 6 wks on 12/15/2023. Pt requires the intervention of skilled outpatient physical therapy to address the aforementioned deficits and progress towards a functional level in line with therapeutic goals.    OBJECTIVE IMPAIRMENTS: decreased mobility, decreased strength, hypomobility, impaired UE functional use, improper body mechanics, postural dysfunction, and pain.   ACTIVITY LIMITATIONS: lifting, sleeping, bed mobility, bathing, toileting, dressing, and reach over head  PARTICIPATION LIMITATIONS: cleaning, laundry, driving, shopping, and community activity  PERSONAL FACTORS: Age, Fitness, Past/current experiences, Time since onset of injury/illness/exacerbation, and 1-2 comorbidities: DM2 are also affecting patient's functional outcome.   REHAB POTENTIAL: Good  CLINICAL DECISION MAKING: Stable/uncomplicated  EVALUATION COMPLEXITY: Low  GOALS: Goals reviewed with patient? Yes  SHORT TERM GOALS: Target date: 12/29/2023  Pt will be independent with administered HEP to demonstrate the competency necessary for long term managemnet of symptoms at home.  Baseline: Goal status: INITIAL   LONG TERM GOALS: Target date: 01/26/2024  Pt. Will achieve a DASH score of  40% as to demonstrate improvement in self-perceived functional ability with daily activities.  Baseline: ~80% Goal status: INITIAL  2.  Pt will report pain levels improving during ADLs to be less than or equal to  2/10 as to demonstrate improved tolerance with daily functional activities such as bathing and dressing. Baseline: 8/10 Goal status: INITIAL  3.  Pt will improve MMT score for R global shoulder strengh to a 4+/5 to demonstrate improvement in strength for quality of motion and activity performance.  Baseline: deferred Goal status: INITIAL  4.  Pt will improve R shoulder flexion and abduction AROM to 160d to demonstrate volitional mobility neccessary for functional overhead mobility utilized during ADLs such as cleaning and washing. Baseline: see obj chart Goal status: INITIAL  ---------------------------------------------------------------------------------------------  PLAN: PT FREQUENCY: 1-2x/week  PT DURATION: 8 weeks  PLANNED INTERVENTIONS: 97110-Therapeutic exercises, 97530- Therapeutic activity, 97112- Neuromuscular re-education, 97535- Self Care, [id number]- Manual therapy, Patient/Family education, Taping, Joint mobilization, and DME instructions  PLAN FOR NEXT SESSION: Review HEP, Begin POC as detailed in assessment   Mabel Kiang, PT, DPT 12/01/2023, 3:37 PM

## 2023-12-02 ENCOUNTER — Ambulatory Visit

## 2023-12-02 DIAGNOSIS — M6281 Muscle weakness (generalized): Secondary | ICD-10-CM

## 2023-12-02 DIAGNOSIS — M25611 Stiffness of right shoulder, not elsewhere classified: Secondary | ICD-10-CM

## 2023-12-02 DIAGNOSIS — M25511 Pain in right shoulder: Secondary | ICD-10-CM | POA: Diagnosis not present

## 2023-12-02 NOTE — Therapy (Signed)
 OUTPATIENT PHYSICAL THERAPY NOTE   Patient Name: Norma [name] MRN: [id number] DOB:[birth year], 63 y.o., female Today's Date: 12/02/2023  END OF SESSION:  PT End of Session - 12/02/23 1304     Visit Number 2    Number of Visits 17    Date for PT Re-Evaluation 01/26/24    PT Start Time 1303    PT Stop Time 1342    PT Time Calculation (min) 39 min    Activity Tolerance Patient tolerated treatment well    Behavior During Therapy WFL for tasks assessed/performed           Past Medical History:  Diagnosis Date   A-fib (HCC)    Acid reflux    Allergy    Anemia    Anxiety    Arthritis    Back pain    Depression    Diabetes mellitus without complication (HCC)    on meds   Dysrhythmia    Fatty liver    Headache    stress headaches, migraines at times   Heart murmur    Hypertension    Irregular heart beat    Left shoulder pain    Shortness of breath dyspnea    with exertion   Tachyarrhythmia    Trigger point of thoracic region 03/22/2012   Past Surgical History:  Procedure Laterality Date   ANKLE SURGERY     CHOLECYSTECTOMY N/A 03/20/2020   Procedure: LAPAROSCOPIC CHOLECYSTECTOMY WITH INTRAOPERATIVE CHOLANGIOGRAM AND WEDGE LIVER BIOPSY;  Surgeon: Gladis Cough, MD;  Location: [facility];  Service: General;  Laterality: N/A;   KNEE SURGERY Left    LUMBAR LAMINECTOMY/DECOMPRESSION MICRODISCECTOMY Left 06/06/2014   Procedure: LUMBAR LAMINECTOMY/DECOMPRESSION MICRODISCECTOMY 1 LEVEL;  Surgeon: Reyes Budge, MD;  Location: [facility];  Service: Neurosurgery;  Laterality: Left;  Left L5S1 microdiskectomy   REVERSE SHOULDER ARTHROPLASTY Right 11/03/2023   Procedure: ARTHROPLASTY, SHOULDER, TOTAL, REVERSE;  Surgeon: Dozier Soulier, MD;  Location: [facility];  Service: Orthopedics;  Laterality: Right;   ROTATOR CUFF REPAIR Bilateral 02/20/2014   TUBAL LIGATION     Patient Active Problem List   Diagnosis Date Noted   Left foot pain 05/17/2023   Elevated serum creatinine  05/17/2023   Epidermal inclusion cyst 12/06/2022   Viral syndrome 04/05/2022   Generalized abdominal cramping 11/27/2021   Tendinopathy of right rotator cuff 04/04/2020   HCV antibody positive 03/18/2020   RUQ pain 03/17/2020   Biliary colic symptom    Elevated liver enzymes    Abnormal findings on diagnostic imaging of liver and biliary tract    Abdominal pain 03/16/2020   Cholelithiasis 03/16/2020   Hypertensive urgency 03/16/2020   Type 2 diabetes mellitus (HCC) 03/16/2020   Lead exposure 11/14/2019   TMJ (temporomandibular joint disorder) 07/31/2019   Left hip pain 02/27/2019   Uterine mass 12/19/2018   Allergic rhinitis with postnasal drip 11/17/2018   Abdominal pain, right lower quadrant 06/23/2018   Diverticulosis of colon without hemorrhage 06/23/2018   Intractable migraine without aura and without status migrainosus 02/16/2018   Iron deficiency anemia 03/29/2017   Vitamin D  deficiency 03/01/2017   Lumbar herniated disc 06/06/2014   Depression    Spinal stenosis, lumbar region, with neurogenic claudication 11/21/2013   Hot flashes 06/30/2011   Eczema, dyshidrotic 06/02/2010   Leg cramps 05/06/2010   FIBROIDS, UTERUS 10/24/2009   GERD 06/09/2009   Chronic pain syndrome 10/04/2008   Hepatitis C carrier (HCC) 03/21/2007   Obesity, Class II, BMI 35-39.9 06/16/2006   HYPERTENSION, BENIGN SYSTEMIC  06/16/2006    PCP: Nicholas Bar, MD  REFERRING PROVIDER: Dozier Soulier, MD  REFERRING DIAG:  Free Text Diagnosis  S/P RIGHT KNEE TSA    Rationale for Evaluation and Treatment: Rehabilitation  THERAPY DIAG:  Right shoulder pain, unspecified chronicity  Stiffness of right shoulder, not elsewhere classified  Muscle weakness (generalized)  PERTINENT HISTORY: GERD, diverticulosis, DM2, HTN  WEIGHT BEARING RESTRICTIONS: Yes NW RUE  FALLS:  Has patient fallen in last 6 months? No  LIVING ENVIRONMENT: Lives with: lives alone Lives in: House/apartment Stairs:  No Has following equipment at home: Single point cane  OCCUPATION: Not currently   PRECAUTIONS: Shoulder no lifting more than 5 lb, no IR behind back ---------------------------------------------------------------------------------------------  SUBJECTIVE:                                                                                                                                                                                      SUBJECTIVE STATEMENT:  12/02/2023 Patient reporting to PT with 8/10 pain, states that her pain is only responding to lidocaine  patch. No improvement with OTC tylenol  and arthritis pain medicine  Eval statement 12/01/2023: had R TSA on 11/03/2023 Has difficulties with lifting, bathing, and ADLs such as making the bed. Currently 8/10 pain, minimal swelling around surgical site, deep aching pain referred from surgery site to midline of arm   Hand dominance: Right  RED FLAGS: None   PLOF: Independent  PATIENT GOALS: be able to lift the shoulder  NEXT MD VISIT: September 1st with surgeon ---------------------------------------------------------------------------------------------  OBJECTIVE:  Note: Objective measures were completed at Evaluation unless otherwise noted.  DIAGNOSTIC FINDINGS:  N/a  PATIENT SURVEYS :  Quick dash: ~80%  COGNITION: Overall cognitive status: Within functional limits for tasks assessed     SENSATION: WFL  POSTURE: Forward head, increased kyphosis  UPPER EXTREMITY ROM:   Passive ROM Right eval Left eval  Shoulder flexion 80d WFL  Shoulder extension  WFL  Shoulder abduction 80d WFL  Shoulder adduction    Shoulder internal rotation 70d WFL  Shoulder external rotation 10d WFL  Elbow flexion    Elbow extension    Wrist flexion    Wrist extension    Wrist ulnar deviation    Wrist radial deviation    Wrist pronation    Wrist supination    (Blank rows = not tested)  UPPER EXTREMITY [id number] d/t pain  and recent surgery  MMT Right eval Left eval  Shoulder flexion    Shoulder extension    Shoulder abduction    Shoulder adduction    Shoulder internal rotation    Shoulder external rotation    Middle trapezius  Lower trapezius    Elbow flexion    Elbow extension    Wrist flexion    Wrist extension    Wrist ulnar deviation    Wrist radial deviation    Wrist pronation    Wrist supination    Grip strength (lbs)    (Blank rows = not tested)  SHOULDER SPECIAL TESTS: deferred  JOINT MOBILITY TESTING:  deferred  PALPATION:  Increased sensitivity around surgical site  TODAY'S TREATMENT:   [facility] Adult PT Treatment:                                                DATE: 12/02/2023  Therapeutic Exercise: R shoulder PROM flexion, abduction  Hand clasped flexion AAROM Dowel abduction AAROM, unable to tolerate Dowel ER AAROM, unable to tolerate  Seated scapular retraction  Seated pball rolling flexion AAROM x 20   Modalities: Vasopneumatic Compression deferred d/t lidocaine  patch donned   Self-Care:  Patient education regarding post op expectations, current restrictions and tx focus during current post op status. Discussed indicated vs contraindication modalities/conservative pain management strategies                                             [facility] Adult PT Treatment:                                                DATE: 12/01/2023 Self Care: POC discussion Pt education   PATIENT EDUCATION: Education details: Pt received education regarding HEP performance, ADL performance, functional activity tolerance, impairment education, appropriate performance of therapeutic activities. Person educated: Patient Education method: Explanation, Demonstration, Tactile cues, Verbal cues, and Handouts Education comprehension: verbalized understanding and returned demonstration  HOME EXERCISE PROGRAM: Access Code: [email or web address] URL: [email or web address] Date:  12/01/2023 Prepared by: Mabel Kiang  Exercises - Supine Shoulder Flexion AAROM with Hands Clasped  - 1-3 x daily - 7 x weekly - 2 sets - 15 reps - 1s hold - Supine Shoulder External Rotation with Dowel  - 1-2 x daily - 7 x weekly - 1-2 sets - 12 reps - 2s hold - Supine Shoulder Abduction AAROM with Dowel  - 1-2 x daily - 7 x weekly - 1-2 sets - 12 reps - 2s hold - Seated Scapular Retraction  - 1-3 x daily - 7 x weekly - 2-3 sets - 10 reps - 5s hold ---------------------------------------------------------------------------------------------  ASSESSMENT:  CLINICAL IMPRESSION:  12/02/2023 Lashon had fair tolerance of initial treatment session. Requires gradual/gentle PROM approach today d/t significant mm guarding. She was able to tolerate approximately 80 degrees of scaption and 45 degrees of abduction towards end of passive ROM. She responded well to seated pball rolling for AAROM, but is unable to tolerate dowel AAROM activities at this time.Patient requires ongoing skilled PT intervention to address current impairments and related functional deficits. We will continue to progress as tolerated.    Eval impression (12/01/2023): Pt. attended today's physical therapy session for evaluation of R TSA. Pt had a R TSA on 11/03/2023, putting her 4 weeks out as of today 12/01/2023. Pt has notable deficits with shoulder mobility, strength, stability,  and pain levels. Pt would benefit from therapeutic focus on P/AAROM and periscapular strengthening in line with surgical protocol. Protocol is written out on the 2nd page of the 11/23/2023 referral seen in the media tab on [employer].  Treatment performed today focused on pt education detailed in the objective. Pt demonstrated great understanding of education provided. required minimal v/t cues and no assistance for appropriate performance with today's activities.  Pts next protocol stage will be s/p 6 wks on 12/15/2023. Pt requires the intervention of skilled  outpatient physical therapy to address the aforementioned deficits and progress towards a functional level in line with therapeutic goals.    OBJECTIVE IMPAIRMENTS: decreased mobility, decreased strength, hypomobility, impaired UE functional use, improper body mechanics, postural dysfunction, and pain.   ACTIVITY LIMITATIONS: lifting, sleeping, bed mobility, bathing, toileting, dressing, and reach over head  PARTICIPATION LIMITATIONS: cleaning, laundry, driving, shopping, and community activity  PERSONAL FACTORS: Age, Fitness, Past/current experiences, Time since onset of injury/illness/exacerbation, and 1-2 comorbidities: DM2 are also affecting patient's functional outcome.   REHAB POTENTIAL: Good  CLINICAL DECISION MAKING: Stable/uncomplicated  EVALUATION COMPLEXITY: Low  GOALS: Goals reviewed with patient? Yes  SHORT TERM GOALS: Target date: 12/29/2023  Pt will be independent with administered HEP to demonstrate the competency necessary for long term managemnet of symptoms at home.  Baseline: Goal status: INITIAL   LONG TERM GOALS: Target date: 01/26/2024  Pt. Will achieve a DASH score of  40% as to demonstrate improvement in self-perceived functional ability with daily activities.  Baseline: ~80% Goal status: INITIAL  2.  Pt will report pain levels improving during ADLs to be less than or equal to 2/10 as to demonstrate improved tolerance with daily functional activities such as bathing and dressing. Baseline: 8/10 Goal status: INITIAL  3.  Pt will improve MMT score for R global shoulder strengh to a 4+/5 to demonstrate improvement in strength for quality of motion and activity performance.  Baseline: deferred Goal status: INITIAL  4.  Pt will improve R shoulder flexion and abduction AROM to 160d to demonstrate volitional mobility neccessary for functional overhead mobility utilized during ADLs such as cleaning and washing. Baseline: see obj chart Goal status:  INITIAL  ---------------------------------------------------------------------------------------------  PLAN: PT FREQUENCY: 1-2x/week  PT DURATION: 8 weeks  PLANNED INTERVENTIONS: 97110-Therapeutic exercises, 97530- Therapeutic activity, 97112- Neuromuscular re-education, 97535- Self Care, [id number]- Manual therapy, Patient/Family education, Taping, Joint mobilization, and DME instructions  PLAN FOR NEXT SESSION: Review HEP, Begin POC as detailed in assessment   Marko Molt, PT, DPT  12/02/2023 2:55 PM

## 2023-12-06 ENCOUNTER — Ambulatory Visit

## 2023-12-06 DIAGNOSIS — M25511 Pain in right shoulder: Secondary | ICD-10-CM | POA: Diagnosis not present

## 2023-12-06 DIAGNOSIS — M6281 Muscle weakness (generalized): Secondary | ICD-10-CM

## 2023-12-06 DIAGNOSIS — M25611 Stiffness of right shoulder, not elsewhere classified: Secondary | ICD-10-CM

## 2023-12-06 NOTE — Therapy (Signed)
 OUTPATIENT PHYSICAL THERAPY NOTE   Patient Name: Norma [name] MRN: [id number] DOB:[birth year], 63 y.o., female Today's Date: 12/06/2023  END OF SESSION:  PT End of Session - 12/06/23 1534     Visit Number 3    Number of Visits 17    Date for PT Re-Evaluation 01/26/24    PT Start Time 1536    PT Stop Time 1614    PT Time Calculation (min) 38 min    Activity Tolerance Patient tolerated treatment well    Behavior During Therapy WFL for tasks assessed/performed            Past Medical History:  Diagnosis Date   A-fib (HCC)    Acid reflux    Allergy    Anemia    Anxiety    Arthritis    Back pain    Depression    Diabetes mellitus without complication (HCC)    on meds   Dysrhythmia    Fatty liver    Headache    stress headaches, migraines at times   Heart murmur    Hypertension    Irregular heart beat    Left shoulder pain    Shortness of breath dyspnea    with exertion   Tachyarrhythmia    Trigger point of thoracic region 03/22/2012   Past Surgical History:  Procedure Laterality Date   ANKLE SURGERY     CHOLECYSTECTOMY N/A 03/20/2020   Procedure: LAPAROSCOPIC CHOLECYSTECTOMY WITH INTRAOPERATIVE CHOLANGIOGRAM AND WEDGE LIVER BIOPSY;  Surgeon: Gladis Cough, MD;  Location: [facility];  Service: General;  Laterality: N/A;   KNEE SURGERY Left    LUMBAR LAMINECTOMY/DECOMPRESSION MICRODISCECTOMY Left 06/06/2014   Procedure: LUMBAR LAMINECTOMY/DECOMPRESSION MICRODISCECTOMY 1 LEVEL;  Surgeon: Reyes Budge, MD;  Location: [facility];  Service: Neurosurgery;  Laterality: Left;  Left L5S1 microdiskectomy   REVERSE SHOULDER ARTHROPLASTY Right 11/03/2023   Procedure: ARTHROPLASTY, SHOULDER, TOTAL, REVERSE;  Surgeon: Dozier Soulier, MD;  Location: [facility];  Service: Orthopedics;  Laterality: Right;   ROTATOR CUFF REPAIR Bilateral 02/20/2014   TUBAL LIGATION     Patient Active Problem List   Diagnosis Date Noted   Left foot pain 05/17/2023   Elevated serum creatinine  05/17/2023   Epidermal inclusion cyst 12/06/2022   Viral syndrome 04/05/2022   Generalized abdominal cramping 11/27/2021   Tendinopathy of right rotator cuff 04/04/2020   HCV antibody positive 03/18/2020   RUQ pain 03/17/2020   Biliary colic symptom    Elevated liver enzymes    Abnormal findings on diagnostic imaging of liver and biliary tract    Abdominal pain 03/16/2020   Cholelithiasis 03/16/2020   Hypertensive urgency 03/16/2020   Type 2 diabetes mellitus (HCC) 03/16/2020   Lead exposure 11/14/2019   TMJ (temporomandibular joint disorder) 07/31/2019   Left hip pain 02/27/2019   Uterine mass 12/19/2018   Allergic rhinitis with postnasal drip 11/17/2018   Abdominal pain, right lower quadrant 06/23/2018   Diverticulosis of colon without hemorrhage 06/23/2018   Intractable migraine without aura and without status migrainosus 02/16/2018   Iron deficiency anemia 03/29/2017   Vitamin D  deficiency 03/01/2017   Lumbar herniated disc 06/06/2014   Depression    Spinal stenosis, lumbar region, with neurogenic claudication 11/21/2013   Hot flashes 06/30/2011   Eczema, dyshidrotic 06/02/2010   Leg cramps 05/06/2010   FIBROIDS, UTERUS 10/24/2009   GERD 06/09/2009   Chronic pain syndrome 10/04/2008   Hepatitis C carrier (HCC) 03/21/2007   Obesity, Class II, BMI 35-39.9 06/16/2006   HYPERTENSION, BENIGN  SYSTEMIC 06/16/2006    PCP: Nicholas Bar, MD  REFERRING PROVIDER: Dozier Soulier, MD  REFERRING DIAG:  Free Text Diagnosis  S/P RIGHT KNEE TSA    Rationale for Evaluation and Treatment: Rehabilitation  THERAPY DIAG:  Right shoulder pain, unspecified chronicity  Stiffness of right shoulder, not elsewhere classified  Muscle weakness (generalized)  PERTINENT HISTORY: GERD, diverticulosis, DM2, HTN  WEIGHT BEARING RESTRICTIONS: Yes NW RUE  FALLS:  Has patient fallen in last 6 months? No  LIVING ENVIRONMENT: Lives with: lives alone Lives in: House/apartment Stairs:  No Has following equipment at home: Single point cane  OCCUPATION: Not currently   PRECAUTIONS: Shoulder no lifting more than 5 lb, no IR behind back ---------------------------------------------------------------------------------------------  SUBJECTIVE:                                                                                                                                                                                      SUBJECTIVE STATEMENT:  12/06/2023 Patient reports to PT with increased pain and states that she feels her swelling has increased.   Eval statement 12/01/2023: had R TSA on 11/03/2023 Has difficulties with lifting, bathing, and ADLs such as making the bed. Currently 8/10 pain, minimal swelling around surgical site, deep aching pain referred from surgery site to midline of arm   Hand dominance: Right  RED FLAGS: None   PLOF: Independent  PATIENT GOALS: be able to lift the shoulder  NEXT MD VISIT: September 1st with surgeon ---------------------------------------------------------------------------------------------  OBJECTIVE:  Note: Objective measures were completed at Evaluation unless otherwise noted.  DIAGNOSTIC FINDINGS:  N/a  PATIENT SURVEYS :  Quick dash: ~80%  COGNITION: Overall cognitive status: Within functional limits for tasks assessed     SENSATION: WFL  POSTURE: Forward head, increased kyphosis  UPPER EXTREMITY ROM:   Passive ROM Right eval Left eval  Shoulder flexion 80d WFL  Shoulder extension  WFL  Shoulder abduction 80d WFL  Shoulder adduction    Shoulder internal rotation 70d WFL  Shoulder external rotation 10d WFL  Elbow flexion    Elbow extension    Wrist flexion    Wrist extension    Wrist ulnar deviation    Wrist radial deviation    Wrist pronation    Wrist supination    (Blank rows = not tested)  UPPER EXTREMITY [id number]:izqzmmzi d/t pain and recent surgery  MMT Right eval Left eval  Shoulder  flexion    Shoulder extension    Shoulder abduction    Shoulder adduction    Shoulder internal rotation    Shoulder external rotation    Middle trapezius    Lower trapezius    Elbow flexion  Elbow extension    Wrist flexion    Wrist extension    Wrist ulnar deviation    Wrist radial deviation    Wrist pronation    Wrist supination    Grip strength (lbs)    (Blank rows = not tested)  SHOULDER SPECIAL TESTS: deferred  JOINT MOBILITY TESTING:  deferred  PALPATION:  Increased sensitivity around surgical site  TODAY'S TREATMENT:    [facility] Adult PT Treatment:                                                DATE: 12/06/2023  Therapeutic Exercise: R shoulder PROM flexion, abduction  Hand clasped flexion AAROM R shoulder PROM ER with proximal arm supported on towel roll  Seated scapular retraction  Seated pball rolling flexion AAROM x 20   Self-Care:  Patient education regarding post op expectations, current restrictions and tx focus during current post op   [facility] Adult PT Treatment:                                                DATE: 12/02/2023  Therapeutic Exercise: R shoulder PROM flexion, abduction  Hand clasped flexion AAROM Dowel abduction AAROM, unable to tolerate Dowel ER AAROM, unable to tolerate  Seated scapular retraction  Seated pball rolling flexion AAROM x 20   Modalities: Vasopneumatic Compression deferred d/t lidocaine  patch donned   Self-Care:  Patient education regarding post op expectations, current restrictions and tx focus during current post op status. Discussed indicated vs contraindication modalities/conservative pain management strategies                                             [facility] Adult PT Treatment:                                                DATE: 12/01/2023 Self Care: POC discussion Pt education   PATIENT EDUCATION: Education details: Pt received education regarding HEP performance, ADL performance, functional activity  tolerance, impairment education, appropriate performance of therapeutic activities. Person educated: Patient Education method: Explanation, Demonstration, Tactile cues, Verbal cues, and Handouts Education comprehension: verbalized understanding and returned demonstration  HOME EXERCISE PROGRAM: Access Code: [email or web address] URL: [email or web address] Date: 12/01/2023 Prepared by: Mabel Kiang  Exercises - Supine Shoulder Flexion AAROM with Hands Clasped  - 1-3 x daily - 7 x weekly - 2 sets - 15 reps - 1s hold - Supine Shoulder External Rotation with Dowel  - 1-2 x daily - 7 x weekly - 1-2 sets - 12 reps - 2s hold - Supine Shoulder Abduction AAROM with Dowel  - 1-2 x daily - 7 x weekly - 1-2 sets - 12 reps - 2s hold - Seated Scapular Retraction  - 1-3 x daily - 7 x weekly - 2-3 sets - 10 reps - 5s hold ---------------------------------------------------------------------------------------------  ASSESSMENT:  CLINICAL IMPRESSION:  12/06/2023 Norma [name] had good tolerance of today's treatment session, which focused on PROM and AAROM activities. She was able  to tolerate up to 68 degrees of shoulder abduction PROM, which is more than 20 degrees from last visit. Patient had some difficulty with shoulder extension PROM. We will continue to progress per POC as tolerated, in order to reach established rehab goals.     Eval impression (12/01/2023): Pt. attended today's physical therapy session for evaluation of R TSA. Pt had a R TSA on 11/03/2023, putting her 4 weeks out as of today 12/01/2023. Pt has notable deficits with shoulder mobility, strength, stability, and pain levels. Pt would benefit from therapeutic focus on P/AAROM and periscapular strengthening in line with surgical protocol. Protocol is written out on the 2nd page of the 11/23/2023 referral seen in the media tab on [employer].  Treatment performed today focused on pt education detailed in the objective. Pt demonstrated great understanding  of education provided. required minimal v/t cues and no assistance for appropriate performance with today's activities.  Pts next protocol stage will be s/p 6 wks on 12/15/2023. Pt requires the intervention of skilled outpatient physical therapy to address the aforementioned deficits and progress towards a functional level in line with therapeutic goals.    OBJECTIVE IMPAIRMENTS: decreased mobility, decreased strength, hypomobility, impaired UE functional use, improper body mechanics, postural dysfunction, and pain.   ACTIVITY LIMITATIONS: lifting, sleeping, bed mobility, bathing, toileting, dressing, and reach over head  PARTICIPATION LIMITATIONS: cleaning, laundry, driving, shopping, and community activity  PERSONAL FACTORS: Age, Fitness, Past/current experiences, Time since onset of injury/illness/exacerbation, and 1-2 comorbidities: DM2 are also affecting patient's functional outcome.   REHAB POTENTIAL: Good  CLINICAL DECISION MAKING: Stable/uncomplicated  EVALUATION COMPLEXITY: Low  GOALS: Goals reviewed with patient? Yes  SHORT TERM GOALS: Target date: 12/29/2023  Pt will be independent with administered HEP to demonstrate the competency necessary for long term managemnet of symptoms at home.  Baseline: Goal status: INITIAL   LONG TERM GOALS: Target date: 01/26/2024  Pt. Will achieve a DASH score of  40% as to demonstrate improvement in self-perceived functional ability with daily activities.  Baseline: ~80% Goal status: INITIAL  2.  Pt will report pain levels improving during ADLs to be less than or equal to 2/10 as to demonstrate improved tolerance with daily functional activities such as bathing and dressing. Baseline: 8/10 Goal status: INITIAL  3.  Pt will improve MMT score for R global shoulder strengh to a 4+/5 to demonstrate improvement in strength for quality of motion and activity performance.  Baseline: deferred Goal status: INITIAL  4.  Pt will improve R  shoulder flexion and abduction AROM to 160d to demonstrate volitional mobility neccessary for functional overhead mobility utilized during ADLs such as cleaning and washing. Baseline: see obj chart Goal status: INITIAL  ---------------------------------------------------------------------------------------------  PLAN: PT FREQUENCY: 1-2x/week  PT DURATION: 8 weeks  PLANNED INTERVENTIONS: 97110-Therapeutic exercises, 97530- Therapeutic activity, 97112- Neuromuscular re-education, 97535- Self Care, [id number]- Manual therapy, Patient/Family education, Taping, Joint mobilization, and DME instructions  PLAN FOR NEXT SESSION: Review HEP, Begin POC as detailed in assessment   Marko Molt, PT, DPT  12/06/2023 4:24 PM

## 2023-12-08 ENCOUNTER — Ambulatory Visit

## 2023-12-08 DIAGNOSIS — M25511 Pain in right shoulder: Secondary | ICD-10-CM

## 2023-12-08 DIAGNOSIS — M6281 Muscle weakness (generalized): Secondary | ICD-10-CM

## 2023-12-08 DIAGNOSIS — M25611 Stiffness of right shoulder, not elsewhere classified: Secondary | ICD-10-CM

## 2023-12-08 NOTE — Therapy (Signed)
 OUTPATIENT PHYSICAL THERAPY NOTE   Patient Name: Norma [name] MRN: [id number] DOB:May 03, 1960, 63 y.o., female Today's Date: 12/08/2023  END OF SESSION:      Past Medical History:  Diagnosis Date   A-fib (HCC)    Acid reflux    Allergy    Anemia    Anxiety    Arthritis    Back pain    Depression    Diabetes mellitus without complication (HCC)    on meds   Dysrhythmia    Fatty liver    Headache    stress headaches, migraines at times   Heart murmur    Hypertension    Irregular heart beat    Left shoulder pain    Shortness of breath dyspnea    with exertion   Tachyarrhythmia    Trigger point of thoracic region 03/22/2012   Past Surgical History:  Procedure Laterality Date   ANKLE SURGERY     CHOLECYSTECTOMY N/A 03/20/2020   Procedure: LAPAROSCOPIC CHOLECYSTECTOMY WITH INTRAOPERATIVE CHOLANGIOGRAM AND WEDGE LIVER BIOPSY;  Surgeon: Gladis Cough, MD;  Location: [facility];  Service: General;  Laterality: N/A;   KNEE SURGERY Left    LUMBAR LAMINECTOMY/DECOMPRESSION MICRODISCECTOMY Left 06/06/2014   Procedure: LUMBAR LAMINECTOMY/DECOMPRESSION MICRODISCECTOMY 1 LEVEL;  Surgeon: Reyes Budge, MD;  Location: [facility];  Service: Neurosurgery;  Laterality: Left;  Left L5S1 microdiskectomy   REVERSE SHOULDER ARTHROPLASTY Right 11/03/2023   Procedure: ARTHROPLASTY, SHOULDER, TOTAL, REVERSE;  Surgeon: Dozier Soulier, MD;  Location: [facility];  Service: Orthopedics;  Laterality: Right;   ROTATOR CUFF REPAIR Bilateral 02/20/2014   TUBAL LIGATION     Patient Active Problem List   Diagnosis Date Noted   Left foot pain 05/17/2023   Elevated serum creatinine 05/17/2023   Epidermal inclusion cyst 12/06/2022   Viral syndrome 04/05/2022   Generalized abdominal cramping 11/27/2021   Tendinopathy of right rotator cuff 04/04/2020   HCV antibody positive 03/18/2020   RUQ pain 03/17/2020   Biliary colic symptom    Elevated liver enzymes    Abnormal findings on diagnostic  imaging of liver and biliary tract    Abdominal pain 03/16/2020   Cholelithiasis 03/16/2020   Hypertensive urgency 03/16/2020   Type 2 diabetes mellitus (HCC) 03/16/2020   Lead exposure 11/14/2019   TMJ (temporomandibular joint disorder) 07/31/2019   Left hip pain 02/27/2019   Uterine mass 12/19/2018   Allergic rhinitis with postnasal drip 11/17/2018   Abdominal pain, right lower quadrant 06/23/2018   Diverticulosis of colon without hemorrhage 06/23/2018   Intractable migraine without aura and without status migrainosus 02/16/2018   Iron deficiency anemia 03/29/2017   Vitamin D  deficiency 03/01/2017   Lumbar herniated disc 06/06/2014   Depression    Spinal stenosis, lumbar region, with neurogenic claudication 11/21/2013   Hot flashes 06/30/2011   Eczema, dyshidrotic 06/02/2010   Leg cramps 05/06/2010   FIBROIDS, UTERUS 10/24/2009   GERD 06/09/2009   Chronic pain syndrome 10/04/2008   Hepatitis C carrier (HCC) 03/21/2007   Obesity, Class II, BMI 35-39.9 06/16/2006   HYPERTENSION, BENIGN SYSTEMIC 06/16/2006    PCP: Nicholas Bar, MD  REFERRING PROVIDER: Dozier Soulier, MD  REFERRING DIAG:  Free Text Diagnosis  S/P RIGHT KNEE TSA    Rationale for Evaluation and Treatment: Rehabilitation  THERAPY DIAG:  Right shoulder pain, unspecified chronicity  Stiffness of right shoulder, not elsewhere classified  Muscle weakness (generalized)  PERTINENT HISTORY: GERD, diverticulosis, DM2, HTN  WEIGHT BEARING RESTRICTIONS: Yes NW RUE  FALLS:  Has patient fallen in  last 6 months? No  LIVING ENVIRONMENT: Lives with: lives alone Lives in: House/apartment Stairs: No Has following equipment at home: Single point cane  OCCUPATION: Not currently   PRECAUTIONS: Shoulder no lifting more than 5 lb, no IR behind back ---------------------------------------------------------------------------------------------  SUBJECTIVE:                                                                                                                                                                                       SUBJECTIVE STATEMENT:  12/06/2023 Patient reports to PT with increased pain and states that she feels her swelling has increased.   Eval statement 12/01/2023: had R TSA on 11/03/2023 Has difficulties with lifting, bathing, and ADLs such as making the bed. Currently 8/10 pain, minimal swelling around surgical site, deep aching pain referred from surgery site to midline of arm   Hand dominance: Right  RED FLAGS: None   PLOF: Independent  PATIENT GOALS: be able to lift the shoulder  NEXT MD VISIT: September 1st with surgeon ---------------------------------------------------------------------------------------------  OBJECTIVE:  Note: Objective measures were completed at Evaluation unless otherwise noted.  DIAGNOSTIC FINDINGS:  N/a  PATIENT SURVEYS :  Quick dash: ~80%  COGNITION: Overall cognitive status: Within functional limits for tasks assessed     SENSATION: WFL  POSTURE: Forward head, increased kyphosis  UPPER EXTREMITY ROM:   Passive ROM Right eval Left eval  Shoulder flexion 80d WFL  Shoulder extension  WFL  Shoulder abduction 80d WFL  Shoulder adduction    Shoulder internal rotation 70d WFL  Shoulder external rotation 10d WFL  Elbow flexion    Elbow extension    Wrist flexion    Wrist extension    Wrist ulnar deviation    Wrist radial deviation    Wrist pronation    Wrist supination    (Blank rows = not tested)  UPPER EXTREMITY [id number] d/t pain and recent surgery  MMT Right eval Left eval  Shoulder flexion    Shoulder extension    Shoulder abduction    Shoulder adduction    Shoulder internal rotation    Shoulder external rotation    Middle trapezius    Lower trapezius    Elbow flexion    Elbow extension    Wrist flexion    Wrist extension    Wrist ulnar deviation    Wrist radial deviation    Wrist  pronation    Wrist supination    Grip strength (lbs)    (Blank rows = not tested)  SHOULDER SPECIAL TESTS: deferred  JOINT MOBILITY TESTING:  deferred  PALPATION:  Increased sensitivity around surgical site  TODAY'S TREATMENT:    [facility]  Treatment:                                                DATE: 12/08/2023  Therapeutic Exercise: R shoulder PROM flexion, abduction  Hand clasped flexion AAROM R shoulder PROM ER with proximal arm supported on towel roll  Seated scapular retraction  Seated pball rolling flexion AAROM x 20   Self-Care:  Patient education regarding post op expectations, current restrictions and tx focus during current post op  [facility] PT Treatment:                                                DATE: 12/06/2023  Therapeutic Exercise: R shoulder PROM flexion, abduction  Hand clasped flexion AAROM R shoulder PROM ER with proximal arm supported on towel roll  Seated scapular retraction  Seated pball rolling flexion AAROM x 20   Self-Care:  Patient education regarding post op expectations, current restrictions and tx focus during current post op   [facility] PT Treatment:                                                DATE: 12/02/2023  Therapeutic Exercise: R shoulder PROM flexion, abduction  Hand clasped flexion AAROM Dowel abduction AAROM, unable to tolerate Dowel ER AAROM, unable to tolerate  Seated scapular retraction  Seated pball rolling flexion AAROM x 20   Modalities: Vasopneumatic Compression deferred d/t lidocaine  patch donned   Self-Care:  Patient education regarding post op expectations, current restrictions and tx focus during current post op status. Discussed indicated vs contraindication modalities/conservative pain management strategies                                             [facility] PT Treatment:                                                DATE: 12/01/2023 Self Care: POC discussion Pt education   PATIENT  EDUCATION: Education details: Pt received education regarding HEP performance, ADL performance, functional activity tolerance, impairment education, appropriate performance of therapeutic activities. Person educated: Patient Education method: Explanation, Demonstration, Tactile cues, Verbal cues, and Handouts Education comprehension: verbalized understanding and returned demonstration  HOME EXERCISE PROGRAM: Access Code: [email or web address] URL: [email or web address] Date: 12/01/2023 Prepared by: Mabel Kiang  Exercises - Supine Shoulder Flexion AAROM with Hands Clasped  - 1-3 x daily - 7 x weekly - 2 sets - 15 reps - 1s hold - Supine Shoulder External Rotation with Dowel  - 1-2 x daily - 7 x weekly - 1-2 sets - 12 reps - 2s hold - Supine Shoulder Abduction AAROM with Dowel  - 1-2 x daily - 7 x weekly - 1-2 sets - 12 reps - 2s hold - Seated Scapular Retraction  - 1-3 x daily - 7 x weekly - 2-3  sets - 10 reps - 5s hold ---------------------------------------------------------------------------------------------  ASSESSMENT:  CLINICAL IMPRESSION:  12/08/2023 Helyn had good tolerance of today's treatment session, which focused on PROM and AAROM activities. She was able to tolerate up to 68 degrees of shoulder abduction PROM, which is more than 20 degrees from last visit. Patient had some difficulty with shoulder extension PROM. We will continue to progress per POC as tolerated, in order to reach established rehab goals.     Eval impression (12/01/2023): Pt. attended today's physical therapy session for evaluation of R TSA. Pt had a R TSA on 11/03/2023, putting her 4 weeks out as of today 12/01/2023. Pt has notable deficits with shoulder mobility, strength, stability, and pain levels. Pt would benefit from therapeutic focus on P/AAROM and periscapular strengthening in line with surgical protocol. Protocol is written out on the 2nd page of the 11/23/2023 referral seen in the media tab on  EPIC.  Treatment performed today focused on pt education detailed in the objective. Pt demonstrated great understanding of education provided. required minimal v/t cues and no assistance for appropriate performance with today's activities.  Pts next protocol stage will be s/p 6 wks on 12/15/2023. Pt requires the intervention of skilled outpatient physical therapy to address the aforementioned deficits and progress towards a functional level in line with therapeutic goals.    OBJECTIVE IMPAIRMENTS: decreased mobility, decreased strength, hypomobility, impaired UE functional use, improper body mechanics, postural dysfunction, and pain.   ACTIVITY LIMITATIONS: lifting, sleeping, bed mobility, bathing, toileting, dressing, and reach over head  PARTICIPATION LIMITATIONS: cleaning, laundry, driving, shopping, and community activity  PERSONAL FACTORS: Age, Fitness, Past/current experiences, Time since onset of injury/illness/exacerbation, and 1-2 comorbidities: DM2 are also affecting patient's functional outcome.   REHAB POTENTIAL: Good  CLINICAL DECISION MAKING: Stable/uncomplicated  EVALUATION COMPLEXITY: Low  GOALS: Goals reviewed with patient? Yes  SHORT TERM GOALS: Target date: 12/29/2023  Pt will be independent with administered HEP to demonstrate the competency necessary for long term managemnet of symptoms at home.  Baseline: Goal status: INITIAL   LONG TERM GOALS: Target date: 01/26/2024  Pt. Will achieve a DASH score of  40% as to demonstrate improvement in self-perceived functional ability with daily activities.  Baseline: ~80% Goal status: INITIAL  2.  Pt will report pain levels improving during ADLs to be less than or equal to 2/10 as to demonstrate improved tolerance with daily functional activities such as bathing and dressing. Baseline: 8/10 Goal status: INITIAL  3.  Pt will improve MMT score for R global shoulder strengh to a 4+/5 to demonstrate improvement in strength  for quality of motion and activity performance.  Baseline: deferred Goal status: INITIAL  4.  Pt will improve R shoulder flexion and abduction AROM to 160d to demonstrate volitional mobility neccessary for functional overhead mobility utilized during ADLs such as cleaning and washing. Baseline: see obj chart Goal status: INITIAL  ---------------------------------------------------------------------------------------------  PLAN: PT FREQUENCY: 1-2x/week  PT DURATION: 8 weeks  PLANNED INTERVENTIONS: 97110-Therapeutic exercises, 97530- Therapeutic activity, 97112- Neuromuscular re-education, 97535- Self Care, [id number]- Manual therapy, Patient/Family education, Taping, Joint mobilization, and DME instructions  PLAN FOR NEXT SESSION: Review HEP, Begin POC as detailed in assessment   Marko Molt, PT, DPT  12/12/2023 7:59 PM

## 2023-12-09 ENCOUNTER — Other Ambulatory Visit: Payer: Self-pay | Admitting: Family Medicine

## 2023-12-09 DIAGNOSIS — M545 Low back pain, unspecified: Secondary | ICD-10-CM

## 2023-12-13 ENCOUNTER — Ambulatory Visit

## 2023-12-13 DIAGNOSIS — M6281 Muscle weakness (generalized): Secondary | ICD-10-CM

## 2023-12-13 DIAGNOSIS — M25511 Pain in right shoulder: Secondary | ICD-10-CM

## 2023-12-13 DIAGNOSIS — M25611 Stiffness of right shoulder, not elsewhere classified: Secondary | ICD-10-CM

## 2023-12-13 NOTE — Therapy (Signed)
 OUTPATIENT PHYSICAL THERAPY NOTE   Patient Name: Norma [name] MRN: [id number] DOB:June 25, 1960, 63 y.o., female Today's Date: 12/13/2023  END OF SESSION:  PT End of Session - 12/13/23 1218     Visit Number 5    Number of Visits 17    Date for PT Re-Evaluation 01/26/24    PT Start Time 1216    PT Stop Time 1250    PT Time Calculation (min) 34 min    Activity Tolerance Patient tolerated treatment well    Behavior During Therapy WFL for tasks assessed/performed          Past Medical History:  Diagnosis Date   A-fib (HCC)    Acid reflux    Allergy    Anemia    Anxiety    Arthritis    Back pain    Depression    Diabetes mellitus without complication (HCC)    on meds   Dysrhythmia    Fatty liver    Headache    stress headaches, migraines at times   Heart murmur    Hypertension    Irregular heart beat    Left shoulder pain    Shortness of breath dyspnea    with exertion   Tachyarrhythmia    Trigger point of thoracic region 03/22/2012   Past Surgical History:  Procedure Laterality Date   ANKLE SURGERY     CHOLECYSTECTOMY N/A 03/20/2020   Procedure: LAPAROSCOPIC CHOLECYSTECTOMY WITH INTRAOPERATIVE CHOLANGIOGRAM AND WEDGE LIVER BIOPSY;  Surgeon: Gladis Cough, MD;  Location: [facility];  Service: General;  Laterality: N/A;   KNEE SURGERY Left    LUMBAR LAMINECTOMY/DECOMPRESSION MICRODISCECTOMY Left 06/06/2014   Procedure: LUMBAR LAMINECTOMY/DECOMPRESSION MICRODISCECTOMY 1 LEVEL;  Surgeon: Reyes Budge, MD;  Location: [facility];  Service: Neurosurgery;  Laterality: Left;  Left L5S1 microdiskectomy   REVERSE SHOULDER ARTHROPLASTY Right 11/03/2023   Procedure: ARTHROPLASTY, SHOULDER, TOTAL, REVERSE;  Surgeon: Dozier Soulier, MD;  Location: [facility];  Service: Orthopedics;  Laterality: Right;   ROTATOR CUFF REPAIR Bilateral 02/20/2014   TUBAL LIGATION     Patient Active Problem List   Diagnosis Date Noted   Left foot pain 05/17/2023   Elevated serum creatinine  05/17/2023   Epidermal inclusion cyst 12/06/2022   Viral syndrome 04/05/2022   Generalized abdominal cramping 11/27/2021   Tendinopathy of right rotator cuff 04/04/2020   HCV antibody positive 03/18/2020   RUQ pain 03/17/2020   Biliary colic symptom    Elevated liver enzymes    Abnormal findings on diagnostic imaging of liver and biliary tract    Abdominal pain 03/16/2020   Cholelithiasis 03/16/2020   Hypertensive urgency 03/16/2020   Type 2 diabetes mellitus (HCC) 03/16/2020   Lead exposure 11/14/2019   TMJ (temporomandibular joint disorder) 07/31/2019   Left hip pain 02/27/2019   Uterine mass 12/19/2018   Allergic rhinitis with postnasal drip 11/17/2018   Abdominal pain, right lower quadrant 06/23/2018   Diverticulosis of colon without hemorrhage 06/23/2018   Intractable migraine without aura and without status migrainosus 02/16/2018   Iron deficiency anemia 03/29/2017   Vitamin D  deficiency 03/01/2017   Lumbar herniated disc 06/06/2014   Depression    Spinal stenosis, lumbar region, with neurogenic claudication 11/21/2013   Hot flashes 06/30/2011   Eczema, dyshidrotic 06/02/2010   Leg cramps 05/06/2010   FIBROIDS, UTERUS 10/24/2009   GERD 06/09/2009   Chronic pain syndrome 10/04/2008   Hepatitis C carrier (HCC) 03/21/2007   Obesity, Class II, BMI 35-39.9 06/16/2006   HYPERTENSION, BENIGN SYSTEMIC 06/16/2006  PCP: Nicholas Bar, MD  REFERRING PROVIDER: Dozier Soulier, MD  REFERRING DIAG:  Free Text Diagnosis  S/P RIGHT KNEE TSA    Rationale for Evaluation and Treatment: Rehabilitation  THERAPY DIAG:  Right shoulder pain, unspecified chronicity  Stiffness of right shoulder, not elsewhere classified  Muscle weakness (generalized)  PERTINENT HISTORY: GERD, diverticulosis, DM2, HTN  WEIGHT BEARING RESTRICTIONS: Yes NW RUE  FALLS:  Has patient fallen in last 6 months? No  LIVING ENVIRONMENT: Lives with: lives alone Lives in: House/apartment Stairs:  No Has following equipment at home: Single point cane  OCCUPATION: Not currently   PRECAUTIONS: Shoulder no lifting more than 5 lb, no IR behind back ---------------------------------------------------------------------------------------------  SUBJECTIVE:                                                                                                                                                                                      SUBJECTIVE STATEMENT: Patient reports that she is still has pain and tenderness, uses pain patches to help pain.   Eval statement 12/01/2023: had R TSA on 11/03/2023 Has difficulties with lifting, bathing, and ADLs such as making the bed. Currently 8/10 pain, minimal swelling around surgical site, deep aching pain referred from surgery site to midline of arm   Hand dominance: Right  RED FLAGS: None   PLOF: Independent  PATIENT GOALS: be able to lift the shoulder  NEXT MD VISIT: September 1st with surgeon ---------------------------------------------------------------------------------------------  OBJECTIVE:  Note: Objective measures were completed at Evaluation unless otherwise noted.  DIAGNOSTIC FINDINGS:  N/a  PATIENT SURVEYS :  Quick dash: ~80%  COGNITION: Overall cognitive status: Within functional limits for tasks assessed     SENSATION: WFL  POSTURE: Forward head, increased kyphosis  UPPER EXTREMITY ROM:   Passive ROM Right eval Left eval  Shoulder flexion 80d WFL  Shoulder extension  WFL  Shoulder abduction 80d WFL  Shoulder adduction    Shoulder internal rotation 70d WFL  Shoulder external rotation 10d WFL  Elbow flexion    Elbow extension    Wrist flexion    Wrist extension    Wrist ulnar deviation    Wrist radial deviation    Wrist pronation    Wrist supination    (Blank rows = not tested)  UPPER EXTREMITY [id number] d/t pain and recent surgery  MMT Right eval Left eval  Shoulder flexion    Shoulder  extension    Shoulder abduction    Shoulder adduction    Shoulder internal rotation    Shoulder external rotation    Middle trapezius    Lower trapezius    Elbow flexion    Elbow extension  Wrist flexion    Wrist extension    Wrist ulnar deviation    Wrist radial deviation    Wrist pronation    Wrist supination    Grip strength (lbs)    (Blank rows = not tested)  SHOULDER SPECIAL TESTS: deferred  JOINT MOBILITY TESTING:  deferred  PALPATION:  Increased sensitivity around surgical site  TODAY'S TREATMENT:  [facility] Adult PT Treatment:                                                DATE: 12/13/23 Therapeutic Exercise: R shoulder PROM flexion, abduction  Supine hand clasped flexion AAROM 2x10 R shoulder PROM ER with proximal arm supported on towel roll  Supine submax isometrics flex, abd, ext (with towel roll) x10 ea Seated scapular retraction  2x10 Seated pball rolling flexion AAROM x 20  Self Care: Patient education regarding post op expectations, current restrictions and tx focus during current post op  [facility] Adult PT Treatment:                                                DATE: 12/08/2023  Therapeutic Exercise: R shoulder PROM flexion, abduction  Hand clasped flexion AAROM R shoulder PROM ER with proximal arm supported on towel roll  Seated scapular retraction  Seated pball rolling flexion AAROM x 20   Self-Care:  Patient education regarding post op expectations, current restrictions and tx focus during current post op  [facility] Adult PT Treatment:                                                DATE: 12/06/2023  Therapeutic Exercise: R shoulder PROM flexion, abduction  Hand clasped flexion AAROM R shoulder PROM ER with proximal arm supported on towel roll  Seated scapular retraction  Seated pball rolling flexion AAROM x 20   Self-Care:  Patient education regarding post op expectations, current restrictions and tx focus during current post op   PATIENT  EDUCATION: Education details: Pt received education regarding HEP performance, ADL performance, functional activity tolerance, impairment education, appropriate performance of therapeutic activities. Person educated: Patient Education method: Explanation, Demonstration, Tactile cues, Verbal cues, and Handouts Education comprehension: verbalized understanding and returned demonstration  HOME EXERCISE PROGRAM: Access Code: [email or web address] URL: [email or web address] Date: 12/01/2023 Prepared by: Mabel Kiang  Exercises - Supine Shoulder Flexion AAROM with Hands Clasped  - 1-3 x daily - 7 x weekly - 2 sets - 15 reps - 1s hold - Supine Shoulder External Rotation with Dowel  - 1-2 x daily - 7 x weekly - 1-2 sets - 12 reps - 2s hold - Supine Shoulder Abduction AAROM with Dowel  - 1-2 x daily - 7 x weekly - 1-2 sets - 12 reps - 2s hold - Seated Scapular Retraction  - 1-3 x daily - 7 x weekly - 2-3 sets - 10 reps - 5s hold ---------------------------------------------------------------------------------------------  ASSESSMENT:  CLINICAL IMPRESSION: Patient presents to PT reporting continued pain and tenderness in her Rt shoulder, has been compliant with HEP. Session today continued to focus on shoulder PROM and AAROM within protocol  limitations. She had the most pain and difficulty with supine flexion AAROM. Patient continues to benefit from skilled PT services and should be progressed as able to improve functional independence.   Eval impression (12/01/2023): Pt. attended today's physical therapy session for evaluation of R TSA. Pt had a R TSA on 11/03/2023, putting her 4 weeks out as of today 12/01/2023. Pt has notable deficits with shoulder mobility, strength, stability, and pain levels. Pt would benefit from therapeutic focus on P/AAROM and periscapular strengthening in line with surgical protocol. Protocol is written out on the 2nd page of the 11/23/2023 referral seen in the media tab on  EPIC.  Treatment performed today focused on pt education detailed in the objective. Pt demonstrated great understanding of education provided. required minimal v/t cues and no assistance for appropriate performance with today's activities.  Pts next protocol stage will be s/p 6 wks on 12/15/2023. Pt requires the intervention of skilled outpatient physical therapy to address the aforementioned deficits and progress towards a functional level in line with therapeutic goals.    OBJECTIVE IMPAIRMENTS: decreased mobility, decreased strength, hypomobility, impaired UE functional use, improper body mechanics, postural dysfunction, and pain.   ACTIVITY LIMITATIONS: lifting, sleeping, bed mobility, bathing, toileting, dressing, and reach over head  PARTICIPATION LIMITATIONS: cleaning, laundry, driving, shopping, and community activity  PERSONAL FACTORS: Age, Fitness, Past/current experiences, Time since onset of injury/illness/exacerbation, and 1-2 comorbidities: DM2 are also affecting patient's functional outcome.   REHAB POTENTIAL: Good  CLINICAL DECISION MAKING: Stable/uncomplicated  EVALUATION COMPLEXITY: Low  GOALS: Goals reviewed with patient? Yes  SHORT TERM GOALS: Target date: 12/29/2023  Pt will be independent with administered HEP to demonstrate the competency necessary for long term managemnet of symptoms at home.  Baseline: Goal status: INITIAL   LONG TERM GOALS: Target date: 01/26/2024  Pt. Will achieve a DASH score of  40% as to demonstrate improvement in self-perceived functional ability with daily activities.  Baseline: ~80% Goal status: INITIAL  2.  Pt will report pain levels improving during ADLs to be less than or equal to 2/10 as to demonstrate improved tolerance with daily functional activities such as bathing and dressing. Baseline: 8/10 Goal status: INITIAL  3.  Pt will improve MMT score for R global shoulder strengh to a 4+/5 to demonstrate improvement in strength  for quality of motion and activity performance.  Baseline: deferred Goal status: INITIAL  4.  Pt will improve R shoulder flexion and abduction AROM to 160d to demonstrate volitional mobility neccessary for functional overhead mobility utilized during ADLs such as cleaning and washing. Baseline: see obj chart Goal status: INITIAL  ---------------------------------------------------------------------------------------------  PLAN: PT FREQUENCY: 1-2x/week  PT DURATION: 8 weeks  PLANNED INTERVENTIONS: 97110-Therapeutic exercises, 97530- Therapeutic activity, 97112- Neuromuscular re-education, 97535- Self Care, 02859- Manual therapy, Patient/Family education, Taping, Joint mobilization, and DME instructions  PLAN FOR NEXT SESSION: Review HEP, Begin POC as detailed in assessment   Corean Pouch PTA  12/13/2023 1:00 PM

## 2023-12-15 ENCOUNTER — Other Ambulatory Visit: Payer: Self-pay

## 2023-12-15 ENCOUNTER — Emergency Department (HOSPITAL_BASED_OUTPATIENT_CLINIC_OR_DEPARTMENT_OTHER)
Admission: EM | Admit: 2023-12-15 | Discharge: 2023-12-15 | Disposition: A | Discharge status: Home / Self Care | Attending: Emergency Medicine | Admitting: Emergency Medicine

## 2023-12-15 ENCOUNTER — Ambulatory Visit

## 2023-12-15 ENCOUNTER — Emergency Department (HOSPITAL_BASED_OUTPATIENT_CLINIC_OR_DEPARTMENT_OTHER): Admitting: Radiology

## 2023-12-15 ENCOUNTER — Encounter (HOSPITAL_BASED_OUTPATIENT_CLINIC_OR_DEPARTMENT_OTHER): Payer: Self-pay | Admitting: Emergency Medicine

## 2023-12-15 ENCOUNTER — Emergency Department (HOSPITAL_BASED_OUTPATIENT_CLINIC_OR_DEPARTMENT_OTHER)

## 2023-12-15 ENCOUNTER — Other Ambulatory Visit (HOSPITAL_BASED_OUTPATIENT_CLINIC_OR_DEPARTMENT_OTHER): Payer: Self-pay

## 2023-12-15 DIAGNOSIS — Z79899 Other long term (current) drug therapy: Secondary | ICD-10-CM | POA: Insufficient documentation

## 2023-12-15 DIAGNOSIS — M25511 Pain in right shoulder: Secondary | ICD-10-CM

## 2023-12-15 DIAGNOSIS — M6281 Muscle weakness (generalized): Secondary | ICD-10-CM

## 2023-12-15 DIAGNOSIS — M25611 Stiffness of right shoulder, not elsewhere classified: Secondary | ICD-10-CM

## 2023-12-15 MED ORDER — MELOXICAM 7.5 MG PO TABS
7.5000 mg | ORAL_TABLET | Freq: Every day | ORAL | 0 refills | Status: AC
  Filled 2023-12-15: qty 14, 14d supply, fill #0

## 2023-12-15 NOTE — Discharge Instructions (Addendum)
 Please follow-up closely with your orthopedic surgeon on an outpatient basis.  Return to emergency department immediately for any new or worsening symptoms.  Please do not take Aleve , Motrin , ibuprofen  with the medication you are prescribed.
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
## 2023-12-16 ENCOUNTER — Ambulatory Visit

## 2023-12-22 ENCOUNTER — Ambulatory Visit: Payer: Self-pay | Attending: Orthopedic Surgery

## 2023-12-22 DIAGNOSIS — M6281 Muscle weakness (generalized): Secondary | ICD-10-CM | POA: Insufficient documentation

## 2023-12-22 DIAGNOSIS — M25611 Stiffness of right shoulder, not elsewhere classified: Secondary | ICD-10-CM | POA: Diagnosis present

## 2023-12-22 DIAGNOSIS — M25511 Pain in right shoulder: Secondary | ICD-10-CM | POA: Diagnosis present
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
## 2023-12-26 ENCOUNTER — Other Ambulatory Visit (HOSPITAL_BASED_OUTPATIENT_CLINIC_OR_DEPARTMENT_OTHER): Payer: Self-pay

## 2023-12-29 ENCOUNTER — Ambulatory Visit

## 2024-01-04 ENCOUNTER — Ambulatory Visit

## 2024-01-04 DIAGNOSIS — M6281 Muscle weakness (generalized): Secondary | ICD-10-CM

## 2024-01-04 DIAGNOSIS — M25511 Pain in right shoulder: Secondary | ICD-10-CM | POA: Diagnosis not present

## 2024-01-04 DIAGNOSIS — M25611 Stiffness of right shoulder, not elsewhere classified: Secondary | ICD-10-CM
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
## 2024-01-06 ENCOUNTER — Ambulatory Visit

## 2024-01-11 ENCOUNTER — Ambulatory Visit

## 2024-01-11 DIAGNOSIS — M25611 Stiffness of right shoulder, not elsewhere classified: Secondary | ICD-10-CM

## 2024-01-11 DIAGNOSIS — M25511 Pain in right shoulder: Secondary | ICD-10-CM

## 2024-01-11 DIAGNOSIS — M6281 Muscle weakness (generalized): Secondary | ICD-10-CM

## 2024-01-11 NOTE — Therapy (Signed)
 OUTPATIENT PHYSICAL THERAPY NOTE   Patient Name: Norma [name] MRN: [id number] DOB:[birth year], 63 y.o., female Today's Date: 01/11/2024  END OF SESSION:  PT End of Session - 01/11/24 1313     Visit Number 8    Number of Visits 17    Date for Recertification  01/26/24    PT Start Time 1135    PT Stop Time 1213    PT Time Calculation (min) 38 min    Activity Tolerance Patient tolerated treatment well    Behavior During Therapy WFL for tasks assessed/performed             Past Medical History:  Diagnosis Date   A-fib (HCC)    Acid reflux    Allergy    Anemia    Anxiety    Arthritis    Back pain    Depression    Diabetes mellitus without complication (HCC)    on meds   Dysrhythmia    Fatty liver    Headache    stress headaches, migraines at times   Heart murmur    Hypertension    Irregular heart beat    Left shoulder pain    Shortness of breath dyspnea    with exertion   Tachyarrhythmia    Trigger point of thoracic region 03/22/2012   Past Surgical History:  Procedure Laterality Date   ANKLE SURGERY     CHOLECYSTECTOMY N/A 03/20/2020   Procedure: LAPAROSCOPIC CHOLECYSTECTOMY WITH INTRAOPERATIVE CHOLANGIOGRAM AND WEDGE LIVER BIOPSY;  Surgeon: Gladis Cough, MD;  Location: [facility];  Service: General;  Laterality: N/A;   KNEE SURGERY Left    LUMBAR LAMINECTOMY/DECOMPRESSION MICRODISCECTOMY Left 06/06/2014   Procedure: LUMBAR LAMINECTOMY/DECOMPRESSION MICRODISCECTOMY 1 LEVEL;  Surgeon: Reyes Budge, MD;  Location: [facility];  Service: Neurosurgery;  Laterality: Left;  Left L5S1 microdiskectomy   REVERSE SHOULDER ARTHROPLASTY Right 11/03/2023   Procedure: ARTHROPLASTY, SHOULDER, TOTAL, REVERSE;  Surgeon: Dozier Soulier, MD;  Location: [facility];  Service: Orthopedics;  Laterality: Right;   ROTATOR CUFF REPAIR Bilateral 02/20/2014   TUBAL LIGATION     Patient Active Problem List   Diagnosis Date Noted   Left foot pain 05/17/2023   Elevated serum  creatinine 05/17/2023   Epidermal inclusion cyst 12/06/2022   Viral syndrome 04/05/2022   Generalized abdominal cramping 11/27/2021   Tendinopathy of right rotator cuff 04/04/2020   HCV antibody positive 03/18/2020   RUQ pain 03/17/2020   Biliary colic symptom    Elevated liver enzymes    Abnormal findings on diagnostic imaging of liver and biliary tract    Abdominal pain 03/16/2020   Cholelithiasis 03/16/2020   Hypertensive urgency 03/16/2020   Type 2 diabetes mellitus (HCC) 03/16/2020   Lead exposure 11/14/2019   TMJ (temporomandibular joint disorder) 07/31/2019   Left hip pain 02/27/2019   Uterine mass 12/19/2018   Allergic rhinitis with postnasal drip 11/17/2018   Abdominal pain, right lower quadrant 06/23/2018   Diverticulosis of colon without hemorrhage 06/23/2018   Intractable migraine without aura and without status migrainosus 02/16/2018   Iron deficiency anemia 03/29/2017   Vitamin D  deficiency 03/01/2017   Lumbar herniated disc 06/06/2014   Depression    Spinal stenosis, lumbar region, with neurogenic claudication 11/21/2013   Hot flashes 06/30/2011   Eczema, dyshidrotic 06/02/2010   Leg cramps 05/06/2010   FIBROIDS, UTERUS 10/24/2009   GERD 06/09/2009   Chronic pain syndrome 10/04/2008   Hepatitis C carrier (HCC) 03/21/2007   Obesity, Class II, BMI 35-39.9 06/16/2006   HYPERTENSION,  BENIGN SYSTEMIC 06/16/2006    PCP: Nicholas Bar, MD  REFERRING PROVIDER: Dozier Soulier, MD  REFERRING DIAG:  Free Text Diagnosis  S/P RIGHT TSA    Rationale for Evaluation and Treatment: Rehabilitation  THERAPY DIAG:  Right shoulder pain, unspecified chronicity  Stiffness of right shoulder, not elsewhere classified  Muscle weakness (generalized)  PERTINENT HISTORY: GERD, diverticulosis, DM2, HTN  WEIGHT BEARING RESTRICTIONS: Yes NW RUE  FALLS:  Has patient fallen in last 6 months? No  LIVING ENVIRONMENT: Lives with: lives alone Lives in:  House/apartment Stairs: No Has following equipment at home: Single point cane  OCCUPATION: Not currently   PRECAUTIONS: Shoulder no lifting more than 5 lb, no IR behind back ---------------------------------------------------------------------------------------------  SUBJECTIVE:                                                                                                                                                                                      SUBJECTIVE STATEMENT: Patient reports that she is doing better today. She states that her surgeon would like her to continue PT for 6 more weeks.   Eval statement 12/01/2023: had R TSA on 11/03/2023 Has difficulties with lifting, bathing, and ADLs such as making the bed. Currently 8/10 pain, minimal swelling around surgical site, deep aching pain referred from surgery site to midline of arm   Hand dominance: Right  RED FLAGS: None   PLOF: Independent  PATIENT GOALS: be able to lift the shoulder  NEXT MD VISIT: September 8th with surgeon ---------------------------------------------------------------------------------------------  OBJECTIVE:  Note: Objective measures were completed at Evaluation unless otherwise noted.  DIAGNOSTIC FINDINGS:  N/a  PATIENT SURVEYS :  Quick dash: ~80%  COGNITION: Overall cognitive status: Within functional limits for tasks assessed     SENSATION: WFL  POSTURE: Forward head, increased kyphosis  UPPER EXTREMITY ROM:   Passive ROM Right eval Left eval Right 01/04/2024  Shoulder flexion 80d WFL   Shoulder extension  WFL   Shoulder abduction 80d WFL   Shoulder adduction     Shoulder internal rotation 70d WFL   Shoulder external rotation 10d WFL   Elbow flexion     Elbow extension     Wrist flexion     Wrist extension     Wrist ulnar deviation     Wrist radial deviation     Wrist pronation     Wrist supination     (Blank rows = not tested)  UPPER EXTREMITY FFU:izqzmmzi d/t  pain and recent surgery  MMT Right eval Left eval  Shoulder flexion    Shoulder extension    Shoulder abduction    Shoulder adduction    Shoulder internal rotation  Shoulder external rotation    Middle trapezius    Lower trapezius    Elbow flexion    Elbow extension    Wrist flexion    Wrist extension    Wrist ulnar deviation    Wrist radial deviation    Wrist pronation    Wrist supination    Grip strength (lbs)    (Blank rows = not tested)  SHOULDER SPECIAL TESTS: deferred  JOINT MOBILITY TESTING:  deferred  PALPATION:  Increased sensitivity around surgical site  TODAY'S TREATMENT:  [facility] Adult PT Treatment:                                                DATE: 01/11/2024  MHP to R shoulder x 6 minutes at start of visit d/t increased mm tension  Seated Pulley 2 min flexion, 2 min scaption  Seated Scap Retraction 2 x 10  Finger Ladder flexion, abduction x 12 each  Shoulder flexion with dowel (2.5lb) 2 x 10  Supine chest press with dowel (2.5lb) 2 x 10  PROM in all directions to patient tolerance  Scar massage performed by clinician       PATIENT EDUCATION: Education details: Pt received education regarding HEP performance, ADL performance, functional activity tolerance, impairment education, appropriate performance of therapeutic activities. Person educated: Patient Education method: Explanation, Demonstration, Tactile cues, Verbal cues, and Handouts Education comprehension: verbalized understanding and returned demonstration  HOME EXERCISE PROGRAM: Access Code: [email or web address] URL: [email or web address] Date: 12/01/2023 Prepared by: Mabel Kiang  Exercises - Supine Shoulder Flexion AAROM with Hands Clasped  - 1-3 x daily - 7 x weekly - 2 sets - 15 reps - 1s hold - Supine Shoulder External Rotation with Dowel  - 1-2 x daily - 7 x weekly - 1-2 sets - 12 reps - 2s hold - Supine Shoulder Abduction AAROM with Dowel  - 1-2 x daily - 7 x weekly - 1-2  sets - 12 reps - 2s hold - Seated Scapular Retraction  - 1-3 x daily - 7 x weekly - 2-3 sets - 10 reps - 5s hold ---------------------------------------------------------------------------------------------  ASSESSMENT:  CLINICAL IMPRESSION: 01/11/2024 Patient had improved tolerance during today's visit. Today's session had increased focus on AAROM activities, which she was able to perform. She does have some mm fatigue at end of session and pain with end-ranges of AROM. We will continue to progress as tolerated.     Eval impression (12/01/2023): Pt. attended today's physical therapy session for evaluation of R TSA. Pt had a R TSA on 11/03/2023, putting her 4 weeks out as of today 12/01/2023. Pt has notable deficits with shoulder mobility, strength, stability, and pain levels. Pt would benefit from therapeutic focus on P/AAROM and periscapular strengthening in line with surgical protocol. Protocol is written out on the 2nd page of the 11/23/2023 referral seen in the media tab on [employer].  Treatment performed today focused on pt education detailed in the objective. Pt demonstrated great understanding of education provided. required minimal v/t cues and no assistance for appropriate performance with today's activities.  Pts next protocol stage will be s/p 6 wks on 12/15/2023. Pt requires the intervention of skilled outpatient physical therapy to address the aforementioned deficits and progress towards a functional level in line with therapeutic goals.    OBJECTIVE IMPAIRMENTS: decreased mobility, decreased strength, hypomobility, impaired UE functional use, improper body mechanics,  postural dysfunction, and pain.   ACTIVITY LIMITATIONS: lifting, sleeping, bed mobility, bathing, toileting, dressing, and reach over head  PARTICIPATION LIMITATIONS: cleaning, laundry, driving, shopping, and community activity  PERSONAL FACTORS: Age, Fitness, Past/current experiences, Time since onset of  injury/illness/exacerbation, and 1-2 comorbidities: DM2 are also affecting patient's functional outcome.   REHAB POTENTIAL: Good  CLINICAL DECISION MAKING: Stable/uncomplicated  EVALUATION COMPLEXITY: Low  GOALS: Goals reviewed with patient? Yes  SHORT TERM GOALS: Target date: 12/29/2023  Pt will be independent with administered HEP to demonstrate the competency necessary for long term managemnet of symptoms at home.  Baseline: Goal status: MET Pt reports adherence 12/22/23   LONG TERM GOALS: Target date: 01/26/2024  Pt. Will achieve a DASH score of  40% as to demonstrate improvement in self-perceived functional ability with daily activities.  Baseline: ~80% Goal status: INITIAL  2.  Pt will report pain levels improving during ADLs to be less than or equal to 2/10 as to demonstrate improved tolerance with daily functional activities such as bathing and dressing. Baseline: 8/10 Goal status: INITIAL  3.  Pt will improve MMT score for R global shoulder strengh to a 4+/5 to demonstrate improvement in strength for quality of motion and activity performance.  Baseline: deferred Goal status: INITIAL  4.  Pt will improve R shoulder flexion and abduction AROM to 160d to demonstrate volitional mobility neccessary for functional overhead mobility utilized during ADLs such as cleaning and washing. Baseline: see obj chart Goal status: INITIAL  ---------------------------------------------------------------------------------------------  PLAN: PT FREQUENCY: 1-2x/week  PT DURATION: 8 weeks  PLANNED INTERVENTIONS: 97110-Therapeutic exercises, 97530- Therapeutic activity, 97112- Neuromuscular re-education, 97535- Self Care, [id number]- Manual therapy, Patient/Family education, Taping, Joint mobilization, and DME instructions  PLAN FOR NEXT SESSION: Review HEP, Begin POC as detailed in assessment   Marko Molt, PT, DPT  01/11/2024 1:23 PM

## 2024-01-17 ENCOUNTER — Ambulatory Visit: Admitting: Physical Therapy

## 2024-01-17 NOTE — Therapy (Deleted)
 OUTPATIENT PHYSICAL THERAPY NOTE   Patient Name: Norma [name] MRN: [id number] DOB:10-Oct-1960, 63 y.o., female Today's Date: 01/17/2024  END OF SESSION:       Past Medical History:  Diagnosis Date   A-fib (HCC)    Acid reflux    Allergy    Anemia    Anxiety    Arthritis    Back pain    Depression    Diabetes mellitus without complication (HCC)    on meds   Dysrhythmia    Fatty liver    Headache    stress headaches, migraines at times   Heart murmur    Hypertension    Irregular heart beat    Left shoulder pain    Shortness of breath dyspnea    with exertion   Tachyarrhythmia    Trigger point of thoracic region 03/22/2012   Past Surgical History:  Procedure Laterality Date   ANKLE SURGERY     CHOLECYSTECTOMY N/A 03/20/2020   Procedure: LAPAROSCOPIC CHOLECYSTECTOMY WITH INTRAOPERATIVE CHOLANGIOGRAM AND WEDGE LIVER BIOPSY;  Surgeon: Gladis Cough, MD;  Location: [facility];  Service: General;  Laterality: N/A;   KNEE SURGERY Left    LUMBAR LAMINECTOMY/DECOMPRESSION MICRODISCECTOMY Left 06/06/2014   Procedure: LUMBAR LAMINECTOMY/DECOMPRESSION MICRODISCECTOMY 1 LEVEL;  Surgeon: Reyes Budge, MD;  Location: [facility];  Service: Neurosurgery;  Laterality: Left;  Left L5S1 microdiskectomy   REVERSE SHOULDER ARTHROPLASTY Right 11/03/2023   Procedure: ARTHROPLASTY, SHOULDER, TOTAL, REVERSE;  Surgeon: Dozier Soulier, MD;  Location: [facility];  Service: Orthopedics;  Laterality: Right;   ROTATOR CUFF REPAIR Bilateral 02/20/2014   TUBAL LIGATION     Patient Active Problem List   Diagnosis Date Noted   Left foot pain 05/17/2023   Elevated serum creatinine 05/17/2023   Epidermal inclusion cyst 12/06/2022   Viral syndrome 04/05/2022   Generalized abdominal cramping 11/27/2021   Tendinopathy of right rotator cuff 04/04/2020   HCV antibody positive 03/18/2020   RUQ pain 03/17/2020   Biliary colic symptom    Elevated liver enzymes    Abnormal findings on diagnostic  imaging of liver and biliary tract    Abdominal pain 03/16/2020   Cholelithiasis 03/16/2020   Hypertensive urgency 03/16/2020   Type 2 diabetes mellitus (HCC) 03/16/2020   Lead exposure 11/14/2019   TMJ (temporomandibular joint disorder) 07/31/2019   Left hip pain 02/27/2019   Uterine mass 12/19/2018   Allergic rhinitis with postnasal drip 11/17/2018   Abdominal pain, right lower quadrant 06/23/2018   Diverticulosis of colon without hemorrhage 06/23/2018   Intractable migraine without aura and without status migrainosus 02/16/2018   Iron deficiency anemia 03/29/2017   Vitamin D  deficiency 03/01/2017   Lumbar herniated disc 06/06/2014   Depression    Spinal stenosis, lumbar region, with neurogenic claudication 11/21/2013   Hot flashes 06/30/2011   Eczema, dyshidrotic 06/02/2010   Leg cramps 05/06/2010   FIBROIDS, UTERUS 10/24/2009   GERD 06/09/2009   Chronic pain syndrome 10/04/2008   Hepatitis C carrier (HCC) 03/21/2007   Obesity, Class II, BMI 35-39.9 06/16/2006   HYPERTENSION, BENIGN SYSTEMIC 06/16/2006    PCP: Nicholas Bar, MD  REFERRING PROVIDER: Dozier Soulier, MD  REFERRING DIAG:  Free Text Diagnosis  S/P RIGHT TSA    Rationale for Evaluation and Treatment: Rehabilitation  THERAPY DIAG:  No diagnosis found.  PERTINENT HISTORY: GERD, diverticulosis, DM2, HTN  WEIGHT BEARING RESTRICTIONS: Yes NW RUE  FALLS:  Has patient fallen in last 6 months? No  LIVING ENVIRONMENT: Lives with: lives alone Lives in: House/apartment  Stairs: No Has following equipment at home: Single point cane  OCCUPATION: Not currently   PRECAUTIONS: Shoulder no lifting more than 5 lb, no IR behind back ---------------------------------------------------------------------------------------------  SUBJECTIVE:                                                                                                                                                                                       SUBJECTIVE STATEMENT: *** Patient reports that she is doing better today. She states that her surgeon would like her to continue PT for 6 more weeks.   Eval statement 12/01/2023: had R TSA on 11/03/2023 Has difficulties with lifting, bathing, and ADLs such as making the bed. Currently 8/10 pain, minimal swelling around surgical site, deep aching pain referred from surgery site to midline of arm   Hand dominance: Right  RED FLAGS: None   PLOF: Independent  PATIENT GOALS: be able to lift the shoulder  NEXT MD VISIT: September 8th with surgeon ---------------------------------------------------------------------------------------------  OBJECTIVE:  Note: Objective measures were completed at Evaluation unless otherwise noted.  DIAGNOSTIC FINDINGS:  N/a  PATIENT SURVEYS :  Quick dash: ~80%  COGNITION: Overall cognitive status: Within functional limits for tasks assessed     SENSATION: WFL  POSTURE: Forward head, increased kyphosis  UPPER EXTREMITY ROM:   Passive ROM Right eval Left eval Right 01/04/2024  Shoulder flexion 80d WFL   Shoulder extension  WFL   Shoulder abduction 80d WFL   Shoulder adduction     Shoulder internal rotation 70d WFL   Shoulder external rotation 10d WFL   Elbow flexion     Elbow extension     Wrist flexion     Wrist extension     Wrist ulnar deviation     Wrist radial deviation     Wrist pronation     Wrist supination     (Blank rows = not tested)  UPPER EXTREMITY [id number] d/t pain and recent surgery  MMT Right eval Left eval  Shoulder flexion    Shoulder extension    Shoulder abduction    Shoulder adduction    Shoulder internal rotation    Shoulder external rotation    Middle trapezius    Lower trapezius    Elbow flexion    Elbow extension    Wrist flexion    Wrist extension    Wrist ulnar deviation    Wrist radial deviation    Wrist pronation    Wrist supination    Grip strength (lbs)    (Blank rows =  not tested)  SHOULDER SPECIAL TESTS: deferred  JOINT MOBILITY TESTING:  deferred  PALPATION:  Increased sensitivity around surgical site  TODAY'S TREATMENT: [facility] Adult PT Treatment:                                                DATE: 01/17/2024  MHP to R shoulder x 6 minutes at start of visit d/t increased mm tension  Seated Pulley 2 min flexion, 2 min scaption  Seated Scap Retraction 2 x 10  Finger Ladder flexion, abduction x 12 each  Shoulder flexion with dowel (2.5lb) 2 x 10  Supine chest press with dowel (2.5lb) 2 x 10  PROM in all directions to patient tolerance  Scar massage performed by clinician   [facility] Adult PT Treatment:                                                DATE: 01/11/2024  MHP to R shoulder x 6 minutes at start of visit d/t increased mm tension  Seated Pulley 2 min flexion, 2 min scaption  Seated Scap Retraction 2 x 10  Finger Ladder flexion, abduction x 12 each  Shoulder flexion with dowel (2.5lb) 2 x 10  Supine chest press with dowel (2.5lb) 2 x 10  PROM in all directions to patient tolerance  Scar massage performed by clinician       PATIENT EDUCATION: Education details: Pt received education regarding HEP performance, ADL performance, functional activity tolerance, impairment education, appropriate performance of therapeutic activities. Person educated: Patient Education method: Explanation, Demonstration, Tactile cues, Verbal cues, and Handouts Education comprehension: verbalized understanding and returned demonstration  HOME EXERCISE PROGRAM: Access Code: [email or web address] URL: [email or web address] Date: 12/01/2023 Prepared by: Mabel Kiang  Exercises - Supine Shoulder Flexion AAROM with Hands Clasped  - 1-3 x daily - 7 x weekly - 2 sets - 15 reps - 1s hold - Supine Shoulder External Rotation with Dowel  - 1-2 x daily - 7 x weekly - 1-2 sets - 12 reps - 2s hold - Supine Shoulder Abduction AAROM with Dowel  - 1-2 x daily - 7 x  weekly - 1-2 sets - 12 reps - 2s hold - Seated Scapular Retraction  - 1-3 x daily - 7 x weekly - 2-3 sets - 10 reps - 5s hold ---------------------------------------------------------------------------------------------  ASSESSMENT:  CLINICAL IMPRESSION: 01/17/2024 *** Patient had improved tolerance during today's visit. Today's session had increased focus on AAROM activities, which she was able to perform. She does have some mm fatigue at end of session and pain with end-ranges of AROM. We will continue to progress as tolerated.     Eval impression (12/01/2023): Pt. attended today's physical therapy session for evaluation of R TSA. Pt had a R TSA on 11/03/2023, putting her 4 weeks out as of today 12/01/2023. Pt has notable deficits with shoulder mobility, strength, stability, and pain levels. Pt would benefit from therapeutic focus on P/AAROM and periscapular strengthening in line with surgical protocol. Protocol is written out on the 2nd page of the 11/23/2023 referral seen in the media tab on [employer].  Treatment performed today focused on pt education detailed in the objective. Pt demonstrated great understanding of education provided. required minimal v/t cues and no assistance for appropriate performance with today's activities.  Pts next protocol stage will be s/p 6 wks on 12/15/2023. Pt requires the  intervention of skilled outpatient physical therapy to address the aforementioned deficits and progress towards a functional level in line with therapeutic goals.    OBJECTIVE IMPAIRMENTS: decreased mobility, decreased strength, hypomobility, impaired UE functional use, improper body mechanics, postural dysfunction, and pain.   ACTIVITY LIMITATIONS: lifting, sleeping, bed mobility, bathing, toileting, dressing, and reach over head  PARTICIPATION LIMITATIONS: cleaning, laundry, driving, shopping, and community activity  PERSONAL FACTORS: Age, Fitness, Past/current experiences, Time since onset of  injury/illness/exacerbation, and 1-2 comorbidities: DM2 are also affecting patient's functional outcome.   REHAB POTENTIAL: Good  CLINICAL DECISION MAKING: Stable/uncomplicated  EVALUATION COMPLEXITY: Low  GOALS: Goals reviewed with patient? Yes  SHORT TERM GOALS: Target date: 12/29/2023  Pt will be independent with administered HEP to demonstrate the competency necessary for long term managemnet of symptoms at home.  Baseline: Goal status: MET Pt reports adherence 12/22/23   LONG TERM GOALS: Target date: 01/26/2024  Pt. Will achieve a DASH score of  40% as to demonstrate improvement in self-perceived functional ability with daily activities.  Baseline: ~80% Goal status: INITIAL  2.  Pt will report pain levels improving during ADLs to be less than or equal to 2/10 as to demonstrate improved tolerance with daily functional activities such as bathing and dressing. Baseline: 8/10 Goal status: INITIAL  3.  Pt will improve MMT score for R global shoulder strengh to a 4+/5 to demonstrate improvement in strength for quality of motion and activity performance.  Baseline: deferred Goal status: INITIAL  4.  Pt will improve R shoulder flexion and abduction AROM to 160d to demonstrate volitional mobility neccessary for functional overhead mobility utilized during ADLs such as cleaning and washing. Baseline: see obj chart Goal status: INITIAL  ---------------------------------------------------------------------------------------------  PLAN: PT FREQUENCY: 1-2x/week  PT DURATION: 8 weeks  PLANNED INTERVENTIONS: 97110-Therapeutic exercises, 97530- Therapeutic activity, 97112- Neuromuscular re-education, 97535- Self Care, [id number]- Manual therapy, Patient/Family education, Taping, Joint mobilization, and DME instructions  PLAN FOR NEXT SESSION: Review HEP, Begin POC as detailed in assessment   Dierdre Mccalip April Ma L Shelsey Rieth, PT, DPT  01/17/2024 8:01 AM

## 2024-01-19 ENCOUNTER — Ambulatory Visit: Attending: Orthopedic Surgery | Admitting: Physical Therapy

## 2024-01-19 ENCOUNTER — Encounter: Payer: Self-pay | Admitting: Physical Therapy

## 2024-01-19 DIAGNOSIS — M25611 Stiffness of right shoulder, not elsewhere classified: Secondary | ICD-10-CM | POA: Insufficient documentation

## 2024-01-19 DIAGNOSIS — M6281 Muscle weakness (generalized): Secondary | ICD-10-CM | POA: Insufficient documentation

## 2024-01-19 DIAGNOSIS — M25511 Pain in right shoulder: Secondary | ICD-10-CM | POA: Insufficient documentation

## 2024-01-19 NOTE — Therapy (Signed)
 OUTPATIENT PHYSICAL THERAPY NOTE   Patient Name: Norma [name] MRN: [id number] DOB:[birth year], 63 y.o., female Today's Date: 01/19/2024  END OF SESSION:  PT End of Session - 01/19/24 1103     Visit Number 9    Number of Visits 17    Date for Recertification  01/26/24    PT Start Time 1103    PT Stop Time 1141    PT Time Calculation (min) 38 min    Activity Tolerance Patient tolerated treatment well    Behavior During Therapy WFL for tasks assessed/performed            Past Medical History:  Diagnosis Date   A-fib (HCC)    Acid reflux    Allergy    Anemia    Anxiety    Arthritis    Back pain    Depression    Diabetes mellitus without complication (HCC)    on meds   Dysrhythmia    Fatty liver    Headache    stress headaches, migraines at times   Heart murmur    Hypertension    Irregular heart beat    Left shoulder pain    Shortness of breath dyspnea    with exertion   Tachyarrhythmia    Trigger point of thoracic region 03/22/2012   Past Surgical History:  Procedure Laterality Date   ANKLE SURGERY     CHOLECYSTECTOMY N/A 03/20/2020   Procedure: LAPAROSCOPIC CHOLECYSTECTOMY WITH INTRAOPERATIVE CHOLANGIOGRAM AND WEDGE LIVER BIOPSY;  Surgeon: Gladis Cough, MD;  Location: [facility];  Service: General;  Laterality: N/A;   KNEE SURGERY Left    LUMBAR LAMINECTOMY/DECOMPRESSION MICRODISCECTOMY Left 06/06/2014   Procedure: LUMBAR LAMINECTOMY/DECOMPRESSION MICRODISCECTOMY 1 LEVEL;  Surgeon: Reyes Budge, MD;  Location: [facility];  Service: Neurosurgery;  Laterality: Left;  Left L5S1 microdiskectomy   REVERSE SHOULDER ARTHROPLASTY Right 11/03/2023   Procedure: ARTHROPLASTY, SHOULDER, TOTAL, REVERSE;  Surgeon: Dozier Soulier, MD;  Location: [facility];  Service: Orthopedics;  Laterality: Right;   ROTATOR CUFF REPAIR Bilateral 02/20/2014   TUBAL LIGATION     Patient Active Problem List   Diagnosis Date Noted   Left foot pain 05/17/2023   Elevated serum creatinine  05/17/2023   Epidermal inclusion cyst 12/06/2022   Viral syndrome 04/05/2022   Generalized abdominal cramping 11/27/2021   Tendinopathy of right rotator cuff 04/04/2020   HCV antibody positive 03/18/2020   RUQ pain 03/17/2020   Biliary colic symptom    Elevated liver enzymes    Abnormal findings on diagnostic imaging of liver and biliary tract    Abdominal pain 03/16/2020   Cholelithiasis 03/16/2020   Hypertensive urgency 03/16/2020   Type 2 diabetes mellitus (HCC) 03/16/2020   Lead exposure 11/14/2019   TMJ (temporomandibular joint disorder) 07/31/2019   Left hip pain 02/27/2019   Uterine mass 12/19/2018   Allergic rhinitis with postnasal drip 11/17/2018   Abdominal pain, right lower quadrant 06/23/2018   Diverticulosis of colon without hemorrhage 06/23/2018   Intractable migraine without aura and without status migrainosus 02/16/2018   Iron deficiency anemia 03/29/2017   Vitamin D  deficiency 03/01/2017   Lumbar herniated disc 06/06/2014   Depression    Spinal stenosis, lumbar region, with neurogenic claudication 11/21/2013   Hot flashes 06/30/2011   Eczema, dyshidrotic 06/02/2010   Leg cramps 05/06/2010   FIBROIDS, UTERUS 10/24/2009   GERD 06/09/2009   Chronic pain syndrome 10/04/2008   Hepatitis C carrier (HCC) 03/21/2007   Obesity, Class II, BMI 35-39.9 06/16/2006   HYPERTENSION, BENIGN  SYSTEMIC 06/16/2006    PCP: Nicholas Bar, MD  REFERRING PROVIDER: Dozier Soulier, MD  REFERRING DIAG:  Free Text Diagnosis  S/P RIGHT TSA    Rationale for Evaluation and Treatment: Rehabilitation  THERAPY DIAG:  Right shoulder pain, unspecified chronicity  Stiffness of right shoulder, not elsewhere classified  Muscle weakness (generalized)  PERTINENT HISTORY: GERD, diverticulosis, DM2, HTN  WEIGHT BEARING RESTRICTIONS: Yes NW RUE  FALLS:  Has patient fallen in last 6 months? No  LIVING ENVIRONMENT: Lives with: lives alone Lives in: House/apartment Stairs:  No Has following equipment at home: Single point cane  OCCUPATION: Not currently   PRECAUTIONS: Shoulder no lifting more than 5 lb, no IR behind back ---------------------------------------------------------------------------------------------  SUBJECTIVE:                                                                                                                                                                                      SUBJECTIVE STATEMENT: Pt states shoulder is doing okay. Just a little tender on the deltoid. It's been feeling better.  Has noticed some skin redness and break down under her arm/arm pit -- discussed moisture barrier creams. Pt has been using baby powder. Will call ortho as well.   Eval statement 12/01/2023: had R TSA on 11/03/2023 Has difficulties with lifting, bathing, and ADLs such as making the bed. Currently 8/10 pain, minimal swelling around surgical site, deep aching pain referred from surgery site to midline of arm   Hand dominance: Right  RED FLAGS: None   PLOF: Independent  PATIENT GOALS: be able to lift the shoulder  NEXT MD VISIT: September 8th with surgeon ---------------------------------------------------------------------------------------------  OBJECTIVE:  Note: Objective measures were completed at Evaluation unless otherwise noted.  DIAGNOSTIC FINDINGS:  N/a  PATIENT SURVEYS :  Quick dash: ~80%  COGNITION: Overall cognitive status: Within functional limits for tasks assessed     SENSATION: WFL  POSTURE: Forward head, increased kyphosis  UPPER EXTREMITY ROM:   Passive ROM Right eval Left eval Right 01/04/2024  Shoulder flexion 80d WFL   Shoulder extension  WFL   Shoulder abduction 80d WFL   Shoulder adduction     Shoulder internal rotation 70d WFL   Shoulder external rotation 10d WFL   Elbow flexion     Elbow extension     Wrist flexion     Wrist extension     Wrist ulnar deviation     Wrist radial deviation      Wrist pronation     Wrist supination     (Blank rows = not tested)  UPPER EXTREMITY [id number] d/t pain and recent surgery  MMT Right eval Left eval  Shoulder flexion  Shoulder extension    Shoulder abduction    Shoulder adduction    Shoulder internal rotation    Shoulder external rotation    Middle trapezius    Lower trapezius    Elbow flexion    Elbow extension    Wrist flexion    Wrist extension    Wrist ulnar deviation    Wrist radial deviation    Wrist pronation    Wrist supination    Grip strength (lbs)    (Blank rows = not tested)  SHOULDER SPECIAL TESTS: deferred  JOINT MOBILITY TESTING:  deferred  PALPATION:  Increased sensitivity around surgical site  TODAY'S TREATMENT: [facility] Adult PT Treatment:                                                DATE: 01/19/2024 Seated Pulley 2 min flexion, 2 min scaption  Seated Scap Retraction 2 x 10  Seated R scapular clock x10 3, 6, 8 o'clock Self massage with tennis ball Standing shoulder isometrics into wall all directions 10x3 Standing wall wash flexion, scaption, horizontal abd x10 each Standing row red TB 2x10  [facility] Adult PT Treatment:                                                DATE: 01/17/2024  MHP to R shoulder x 6 minutes at start of visit d/t increased mm tension  Seated Pulley 2 min flexion, 2 min scaption  Seated Scap Retraction 2 x 10  Finger Ladder flexion, abduction x 12 each  Shoulder flexion with dowel (2.5lb) 2 x 10  Supine chest press with dowel (2.5lb) 2 x 10  PROM in all directions to patient tolerance  Scar massage performed by clinician   [facility] Adult PT Treatment:                                                DATE: 01/11/2024  MHP to R shoulder x 6 minutes at start of visit d/t increased mm tension  Seated Pulley 2 min flexion, 2 min scaption  Seated Scap Retraction 2 x 10  Finger Ladder flexion, abduction x 12 each  Shoulder flexion with dowel (2.5lb) 2 x 10  Supine chest  press with dowel (2.5lb) 2 x 10  PROM in all directions to patient tolerance  Scar massage performed by clinician       PATIENT EDUCATION: Education details: Pt received education regarding HEP performance, ADL performance, functional activity tolerance, impairment education, appropriate performance of therapeutic activities. Person educated: Patient Education method: Explanation, Demonstration, Tactile cues, Verbal cues, and Handouts Education comprehension: verbalized understanding and returned demonstration  HOME EXERCISE PROGRAM: Access Code: [email or web address] URL: [email or web address] Date: 01/19/2024 Prepared by: Carsin Randazzo April Earnie Starring  Exercises - Seated Scapular Retraction  - 1-3 x daily - 7 x weekly - 2-3 sets - 10 reps - 5s hold - Seated Scapular Clock (1 to 7)  - 1 x daily - 7 x weekly - 1 sets - 10 reps - Seated Scapular Clock (11 to 5)  - 1 x daily - 7 x weekly -  1 sets - 10 reps - Standing Isometric Shoulder External Rotation with Doorway  - 1 x daily - 7 x weekly - 2 sets - 10 reps - Isometric Shoulder Abduction at Wall  - 1 x daily - 7 x weekly - 2 sets - 10 reps - Isometric Shoulder External Rotation at Wall  - 1 x daily - 7 x weekly - 2 sets - 10 reps - Standing Isometric Shoulder Internal Rotation at Doorway  - 1 x daily - 7 x weekly - 2 sets - 10 reps - Isometric Shoulder Flexion at Wall  - 1 x daily - 7 x weekly - 2 sets - 10 reps - Standing Shoulder Abduction Slides at Wall  - 1 x daily - 7 x weekly - 1 sets - 10 reps - Standing Single Shoulder Flexion Wall Slide with Palm Up (Mirrored)  - 1 x daily - 7 x weekly - 1 sets - 10 reps ---------------------------------------------------------------------------------------------  ASSESSMENT:  CLINICAL IMPRESSION: 01/19/2024 Treatment focused on continuing to improve pt's strength and AAROM for shoulder. Added scapular strengthening and shoulder isometrics. Updated pt's HEP accordingly.     Eval  impression (12/01/2023): Pt. attended today's physical therapy session for evaluation of R TSA. Pt had a R TSA on 11/03/2023, putting her 4 weeks out as of today 12/01/2023. Pt has notable deficits with shoulder mobility, strength, stability, and pain levels. Pt would benefit from therapeutic focus on P/AAROM and periscapular strengthening in line with surgical protocol. Protocol is written out on the 2nd page of the 11/23/2023 referral seen in the media tab on [employer].  Treatment performed today focused on pt education detailed in the objective. Pt demonstrated great understanding of education provided. required minimal v/t cues and no assistance for appropriate performance with today's activities.  Pts next protocol stage will be s/p 6 wks on 12/15/2023. Pt requires the intervention of skilled outpatient physical therapy to address the aforementioned deficits and progress towards a functional level in line with therapeutic goals.    OBJECTIVE IMPAIRMENTS: decreased mobility, decreased strength, hypomobility, impaired UE functional use, improper body mechanics, postural dysfunction, and pain.   ACTIVITY LIMITATIONS: lifting, sleeping, bed mobility, bathing, toileting, dressing, and reach over head  PARTICIPATION LIMITATIONS: cleaning, laundry, driving, shopping, and community activity  PERSONAL FACTORS: Age, Fitness, Past/current experiences, Time since onset of injury/illness/exacerbation, and 1-2 comorbidities: DM2 are also affecting patient's functional outcome.   REHAB POTENTIAL: Good  CLINICAL DECISION MAKING: Stable/uncomplicated  EVALUATION COMPLEXITY: Low  GOALS: Goals reviewed with patient? Yes  SHORT TERM GOALS: Target date: 12/29/2023  Pt will be independent with administered HEP to demonstrate the competency necessary for long term managemnet of symptoms at home.  Baseline: Goal status: MET Pt reports adherence 12/22/23   LONG TERM GOALS: Target date: 01/26/2024  Pt. Will achieve a  DASH score of  40% as to demonstrate improvement in self-perceived functional ability with daily activities.  Baseline: ~80% Goal status: INITIAL  2.  Pt will report pain levels improving during ADLs to be less than or equal to 2/10 as to demonstrate improved tolerance with daily functional activities such as bathing and dressing. Baseline: 8/10 Goal status: INITIAL  3.  Pt will improve MMT score for R global shoulder strengh to a 4+/5 to demonstrate improvement in strength for quality of motion and activity performance.  Baseline: deferred Goal status: INITIAL  4.  Pt will improve R shoulder flexion and abduction AROM to 160d to demonstrate volitional mobility neccessary for functional overhead mobility  utilized during ADLs such as cleaning and washing. Baseline: see obj chart Goal status: INITIAL  ---------------------------------------------------------------------------------------------  PLAN: PT FREQUENCY: 1-2x/week  PT DURATION: 8 weeks  PLANNED INTERVENTIONS: 97110-Therapeutic exercises, 97530- Therapeutic activity, 97112- Neuromuscular re-education, 97535- Self Care, [id number]- Manual therapy, Patient/Family education, Taping, Joint mobilization, and DME instructions  PLAN FOR NEXT SESSION: Review HEP, Begin POC as detailed in assessment   Shabria Egley April Ma L Lounell Schumacher, PT, DPT  01/19/2024 11:03 AM

## 2024-01-24 ENCOUNTER — Ambulatory Visit: Admitting: Physical Therapy

## 2024-01-24 DIAGNOSIS — M25511 Pain in right shoulder: Secondary | ICD-10-CM | POA: Diagnosis not present

## 2024-01-24 DIAGNOSIS — M25611 Stiffness of right shoulder, not elsewhere classified: Secondary | ICD-10-CM

## 2024-01-24 DIAGNOSIS — M6281 Muscle weakness (generalized): Secondary | ICD-10-CM

## 2024-01-24 NOTE — Therapy (Signed)
 OUTPATIENT PHYSICAL THERAPY NOTE   Patient Name: Norma [name] MRN: [id number] DOB:[birth year], 63 y.o., female Today's Date: 01/24/2024  END OF SESSION:  PT End of Session - 01/24/24 1148     Visit Number 10    Number of Visits 22    Date for Recertification  03/06/24    PT Start Time 1148    PT Stop Time 1236    PT Time Calculation (min) 48 min    Activity Tolerance Patient tolerated treatment well    Behavior During Therapy WFL for tasks assessed/performed           Past Medical History:  Diagnosis Date   A-fib (HCC)    Acid reflux    Allergy    Anemia    Anxiety    Arthritis    Back pain    Depression    Diabetes mellitus without complication (HCC)    on meds   Dysrhythmia    Fatty liver    Headache    stress headaches, migraines at times   Heart murmur    Hypertension    Irregular heart beat    Left shoulder pain    Shortness of breath dyspnea    with exertion   Tachyarrhythmia    Trigger point of thoracic region 03/22/2012   Past Surgical History:  Procedure Laterality Date   ANKLE SURGERY     CHOLECYSTECTOMY N/A 03/20/2020   Procedure: LAPAROSCOPIC CHOLECYSTECTOMY WITH INTRAOPERATIVE CHOLANGIOGRAM AND WEDGE LIVER BIOPSY;  Surgeon: Gladis Cough, MD;  Location: [facility];  Service: General;  Laterality: N/A;   KNEE SURGERY Left    LUMBAR LAMINECTOMY/DECOMPRESSION MICRODISCECTOMY Left 06/06/2014   Procedure: LUMBAR LAMINECTOMY/DECOMPRESSION MICRODISCECTOMY 1 LEVEL;  Surgeon: Reyes Budge, MD;  Location: [facility];  Service: Neurosurgery;  Laterality: Left;  Left L5S1 microdiskectomy   REVERSE SHOULDER ARTHROPLASTY Right 11/03/2023   Procedure: ARTHROPLASTY, SHOULDER, TOTAL, REVERSE;  Surgeon: Dozier Soulier, MD;  Location: [facility];  Service: Orthopedics;  Laterality: Right;   ROTATOR CUFF REPAIR Bilateral 02/20/2014   TUBAL LIGATION     Patient Active Problem List   Diagnosis Date Noted   Left foot pain 05/17/2023   Elevated serum creatinine  05/17/2023   Epidermal inclusion cyst 12/06/2022   Viral syndrome 04/05/2022   Generalized abdominal cramping 11/27/2021   Tendinopathy of right rotator cuff 04/04/2020   HCV antibody positive 03/18/2020   RUQ pain 03/17/2020   Biliary colic symptom    Elevated liver enzymes    Abnormal findings on diagnostic imaging of liver and biliary tract    Abdominal pain 03/16/2020   Cholelithiasis 03/16/2020   Hypertensive urgency 03/16/2020   Type 2 diabetes mellitus (HCC) 03/16/2020   Lead exposure 11/14/2019   TMJ (temporomandibular joint disorder) 07/31/2019   Left hip pain 02/27/2019   Uterine mass 12/19/2018   Allergic rhinitis with postnasal drip 11/17/2018   Abdominal pain, right lower quadrant 06/23/2018   Diverticulosis of colon without hemorrhage 06/23/2018   Intractable migraine without aura and without status migrainosus 02/16/2018   Iron deficiency anemia 03/29/2017   Vitamin D  deficiency 03/01/2017   Lumbar herniated disc 06/06/2014   Depression    Spinal stenosis, lumbar region, with neurogenic claudication 11/21/2013   Hot flashes 06/30/2011   Eczema, dyshidrotic 06/02/2010   Leg cramps 05/06/2010   FIBROIDS, UTERUS 10/24/2009   GERD 06/09/2009   Chronic pain syndrome 10/04/2008   Hepatitis C carrier (HCC) 03/21/2007   Obesity, Class II, BMI 35-39.9 06/16/2006   HYPERTENSION, BENIGN SYSTEMIC  06/16/2006    PCP: Nicholas Bar, MD  REFERRING PROVIDER: Dozier Soulier, MD  REFERRING DIAG:  Free Text Diagnosis  S/P RIGHT TSA    Rationale for Evaluation and Treatment: Rehabilitation  THERAPY DIAG:  Right shoulder pain, unspecified chronicity  Stiffness of right shoulder, not elsewhere classified  Muscle weakness (generalized)  PERTINENT HISTORY: GERD, diverticulosis, DM2, HTN  WEIGHT BEARING RESTRICTIONS: No  FALLS:  Has patient fallen in last 6 months? No  LIVING ENVIRONMENT: Lives with: lives alone Lives in: House/apartment Stairs: No Has  following equipment at home: Single point cane  OCCUPATION: Not currently   PRECAUTIONS: Shoulder no lifting more than 5 lb, no IR behind back ---------------------------------------------------------------------------------------------  SUBJECTIVE:                                                                                                                                                                                      SUBJECTIVE STATEMENT: Pt states she has had a hard time due to pain from a pinched nerve on her back. Reports her arm has been hurting since last PT session. 7/10 pain in R anterior shoulder along humeral head. Does feel that her shoulder blade pain has improved.   Eval statement 12/01/2023: had R TSA on 11/03/2023 Has difficulties with lifting, bathing, and ADLs such as making the bed. Currently 8/10 pain, minimal swelling around surgical site, deep aching pain referred from surgery site to midline of arm   Hand dominance: Right  RED FLAGS: None   PLOF: Independent  PATIENT GOALS: be able to lift the shoulder  NEXT MD VISIT: n/a ---------------------------------------------------------------------------------------------  OBJECTIVE:  Note: Objective measures were completed at Evaluation unless otherwise noted.  DIAGNOSTIC FINDINGS:  N/a  PATIENT SURVEYS :  Quick dash: ~80%  COGNITION: Overall cognitive status: Within functional limits for tasks assessed     SENSATION: WFL  POSTURE: Forward head, increased kyphosis  UPPER EXTREMITY ROM:   ROM Right eval Left eval Right 01/04/2024  Shoulder flexion P: 80d WFL A: 95 sup  Shoulder extension  WFL   Shoulder abduction P: 80d WFL A: 87 sup  Shoulder adduction     Shoulder internal rotation P: 70d WFL   Shoulder external rotation P: 10d WFL A: 25 sitting  Elbow flexion     Elbow extension     Wrist flexion     Wrist extension     Wrist ulnar deviation     Wrist radial deviation     Wrist  pronation     Wrist supination     (Blank rows = not tested)  UPPER EXTREMITY [id number] d/t pain and recent surgery  MMT Right eval Left eval  Shoulder flexion    Shoulder extension    Shoulder abduction    Shoulder adduction    Shoulder internal rotation    Shoulder external rotation    Middle trapezius    Lower trapezius    Elbow flexion    Elbow extension    Wrist flexion    Wrist extension    Wrist ulnar deviation    Wrist radial deviation    Wrist pronation    Wrist supination    Grip strength (lbs)    (Blank rows = not tested)  SHOULDER SPECIAL TESTS: deferred  JOINT MOBILITY TESTING:  deferred  PALPATION:  Increased sensitivity around surgical site  TODAY'S TREATMENT: [facility] Adult PT Treatment:                                                DATE: 01/24/2024 Seated Pulley 2 min flexion, 2 min scaption  Seated Scap Retraction 2 x 10  Grade II to III joint mobs superior, post, inferior along glenohumeral joint using pillow case Grade II gentle shoulder distraction using pillow case STM & TPR R bicep and deltoid Seated shoulder ER AROM x10 Seated W x10 Seated low shoulder horizontal abd with scap squeeze x10 Seated bicep/pec stretch x30 Modalities: Pre mod, intensity to pt tolerance for pain management/muscle inhibition bicep and deltoid x 10 min   [facility] Adult PT Treatment:                                                DATE: 01/19/2024 Seated Pulley 2 min flexion, 2 min scaption  Seated Scap Retraction 2 x 10  Seated R scapular clock x10 3, 6, 8 o'clock Self massage with tennis ball Standing shoulder isometrics into wall all directions 10x3 Standing wall wash flexion, scaption, horizontal abd x10 each Standing row red TB 2x10  [facility] Adult PT Treatment:                                                DATE: 01/17/2024  MHP to R shoulder x 6 minutes at start of visit d/t increased mm tension  Seated Pulley 2 min flexion, 2 min scaption  Seated Scap  Retraction 2 x 10  Finger Ladder flexion, abduction x 12 each  Shoulder flexion with dowel (2.5lb) 2 x 10  Supine chest press with dowel (2.5lb) 2 x 10  PROM in all directions to patient tolerance  Scar massage performed by clinician     PATIENT EDUCATION: Education details: Pt received education regarding HEP performance, ADL performance, functional activity tolerance, impairment education, appropriate performance of therapeutic activities. Person educated: Patient Education method: Explanation, Demonstration, Tactile cues, Verbal cues, and Handouts Education comprehension: verbalized understanding and returned demonstration  HOME EXERCISE PROGRAM: Access Code: [email or web address] URL: [email or web address] Date: 01/19/2024 Prepared by: Roanne Haye April Earnie Starring  Exercises - Seated Scapular Retraction  - 1-3 x daily - 7 x weekly - 2-3 sets - 10 reps - 5s hold - Seated Scapular Clock (1 to 7)  - 1 x daily - 7 x weekly - 1 sets - 10 reps - Seated  Scapular Clock (11 to 5)  - 1 x daily - 7 x weekly - 1 sets - 10 reps - Standing Isometric Shoulder External Rotation with Doorway  - 1 x daily - 7 x weekly - 2 sets - 10 reps - Isometric Shoulder Abduction at Wall  - 1 x daily - 7 x weekly - 2 sets - 10 reps - Isometric Shoulder External Rotation at Wall  - 1 x daily - 7 x weekly - 2 sets - 10 reps - Standing Isometric Shoulder Internal Rotation at Doorway  - 1 x daily - 7 x weekly - 2 sets - 10 reps - Isometric Shoulder Flexion at Wall  - 1 x daily - 7 x weekly - 2 sets - 10 reps - Standing Shoulder Abduction Slides at Wall  - 1 x daily - 7 x weekly - 1 sets - 10 reps - Standing Single Shoulder Flexion Wall Slide with Palm Up (Mirrored)  - 1 x daily - 7 x weekly - 1 sets - 10 reps ---------------------------------------------------------------------------------------------  ASSESSMENT:  CLINICAL IMPRESSION: 01/24/2024 Some increased anterior shoulder/deltoid pain today. Pt felt best  with posterior and inferior glenohumeral glides. Worked primarily on posterior shoulder/scapular strengthening to decrease pt's pain. Performed trial of e-stim to try and decrease her pain. Pain down to 5/10 by end of session. Pt is demonstrating increase in ROM and is progressing towards her LTGs but has not yet met her goal status. She will benefit from continued PT to work on these remaining deficits.     Eval impression (12/01/2023): Pt. attended today's physical therapy session for evaluation of R TSA. Pt had a R TSA on 11/03/2023, putting her 4 weeks out as of today 12/01/2023. Pt has notable deficits with shoulder mobility, strength, stability, and pain levels. Pt would benefit from therapeutic focus on P/AAROM and periscapular strengthening in line with surgical protocol. Protocol is written out on the 2nd page of the 11/23/2023 referral seen in the media tab on [employer].  Treatment performed today focused on pt education detailed in the objective. Pt demonstrated great understanding of education provided. required minimal v/t cues and no assistance for appropriate performance with today's activities.  Pts next protocol stage will be s/p 6 wks on 12/15/2023. Pt requires the intervention of skilled outpatient physical therapy to address the aforementioned deficits and progress towards a functional level in line with therapeutic goals.    OBJECTIVE IMPAIRMENTS: decreased mobility, decreased strength, hypomobility, impaired UE functional use, improper body mechanics, postural dysfunction, and pain.   ACTIVITY LIMITATIONS: lifting, sleeping, bed mobility, bathing, toileting, dressing, and reach over head  PARTICIPATION LIMITATIONS: cleaning, laundry, driving, shopping, and community activity  PERSONAL FACTORS: Age, Fitness, Past/current experiences, Time since onset of injury/illness/exacerbation, and 1-2 comorbidities: DM2 are also affecting patient's functional outcome.   REHAB POTENTIAL:  Good  CLINICAL DECISION MAKING: Stable/uncomplicated  EVALUATION COMPLEXITY: Low  GOALS: Goals reviewed with patient? Yes  SHORT TERM GOALS: Target date: 12/29/2023  Pt will be independent with administered HEP to demonstrate the competency necessary for long term managemnet of symptoms at home.  Baseline: Goal status: MET Pt reports adherence 12/22/23   LONG TERM GOALS: Target date: 03/06/2024   Pt. Will achieve a DASH score of  40% as to demonstrate improvement in self-perceived functional ability with daily activities.  Baseline: ~80% 01/24/24: QuickDASH Score: 40.9 / 100 = 40.9 % Goal status: IN PROGRESS  2.  Pt will report pain levels improving during ADLs to be less than or  equal to 2/10 as to demonstrate improved tolerance with daily functional activities such as bathing and dressing. Baseline: 8/10 01/24/24: at most 7/10  Goal status: IN PROGRESS  3.  Pt will improve MMT score for R global shoulder strengh to a 4+/5 to demonstrate improvement in strength for quality of motion and activity performance.  Baseline: deferred 01/24/24: 3-/5 Goal status: IN PROGRESS  4.  Pt will improve R shoulder flexion and abduction AROM to 160d to demonstrate volitional mobility neccessary for functional overhead mobility utilized during ADLs such as cleaning and washing. Baseline: see obj chart 01/24/24: shoulder flexion 95, abd 87 in supine Goal status: IN PROGRESS  ---------------------------------------------------------------------------------------------  PLAN: PT FREQUENCY: 1-2x/week  PT DURATION: 6 weeks  PLANNED INTERVENTIONS: 97110-Therapeutic exercises, 97530- Therapeutic activity, 97112- Neuromuscular re-education, 97535- Self Care, [id number]- Manual therapy, G0283- Electrical stimulation (unattended), 97016- Vasopneumatic device, 97033- Ionotophoresis 4mg /ml Dexamethasone , Patient/Family education, Taping, Joint mobilization, DME instructions, Cryotherapy, and Moist heat  PLAN  FOR NEXT SESSION: Review HEP, Begin POC as detailed in assessment   Nazario Russom April Ma L America Sandall, PT, DPT  01/24/2024 12:45 PM

## 2024-01-27 ENCOUNTER — Encounter: Payer: Self-pay | Admitting: Physical Therapy

## 2024-01-27 ENCOUNTER — Ambulatory Visit: Admitting: Physical Therapy

## 2024-01-27 DIAGNOSIS — M25511 Pain in right shoulder: Secondary | ICD-10-CM | POA: Diagnosis not present

## 2024-01-27 DIAGNOSIS — M25611 Stiffness of right shoulder, not elsewhere classified: Secondary | ICD-10-CM

## 2024-01-27 DIAGNOSIS — M6281 Muscle weakness (generalized): Secondary | ICD-10-CM

## 2024-01-27 NOTE — Therapy (Signed)
 OUTPATIENT PHYSICAL THERAPY NOTE   Patient Name: Norma [name] MRN: [id number] DOB:1960-04-22, 63 y.o., female Today's Date: 01/27/2024  END OF SESSION:  PT End of Session - 01/27/24 1041     Visit Number 11    Number of Visits 22    Date for Recertification  03/06/24    PT Start Time 1045    PT Stop Time 1125    PT Time Calculation (min) 40 min    Activity Tolerance Patient tolerated treatment well    Behavior During Therapy WFL for tasks assessed/performed            Past Medical History:  Diagnosis Date   A-fib (HCC)    Acid reflux    Allergy    Anemia    Anxiety    Arthritis    Back pain    Depression    Diabetes mellitus without complication (HCC)    on meds   Dysrhythmia    Fatty liver    Headache    stress headaches, migraines at times   Heart murmur    Hypertension    Irregular heart beat    Left shoulder pain    Shortness of breath dyspnea    with exertion   Tachyarrhythmia    Trigger point of thoracic region 03/22/2012   Past Surgical History:  Procedure Laterality Date   ANKLE SURGERY     CHOLECYSTECTOMY N/A 03/20/2020   Procedure: LAPAROSCOPIC CHOLECYSTECTOMY WITH INTRAOPERATIVE CHOLANGIOGRAM AND WEDGE LIVER BIOPSY;  Surgeon: Gladis Cough, MD;  Location: [facility];  Service: General;  Laterality: N/A;   KNEE SURGERY Left    LUMBAR LAMINECTOMY/DECOMPRESSION MICRODISCECTOMY Left 06/06/2014   Procedure: LUMBAR LAMINECTOMY/DECOMPRESSION MICRODISCECTOMY 1 LEVEL;  Surgeon: Reyes Budge, MD;  Location: [facility];  Service: Neurosurgery;  Laterality: Left;  Left L5S1 microdiskectomy   REVERSE SHOULDER ARTHROPLASTY Right 11/03/2023   Procedure: ARTHROPLASTY, SHOULDER, TOTAL, REVERSE;  Surgeon: Dozier Soulier, MD;  Location: [facility];  Service: Orthopedics;  Laterality: Right;   ROTATOR CUFF REPAIR Bilateral 02/20/2014   TUBAL LIGATION     Patient Active Problem List   Diagnosis Date Noted   Left foot pain 05/17/2023   Elevated serum  creatinine 05/17/2023   Epidermal inclusion cyst 12/06/2022   Viral syndrome 04/05/2022   Generalized abdominal cramping 11/27/2021   Tendinopathy of right rotator cuff 04/04/2020   HCV antibody positive 03/18/2020   RUQ pain 03/17/2020   Biliary colic symptom    Elevated liver enzymes    Abnormal findings on diagnostic imaging of liver and biliary tract    Abdominal pain 03/16/2020   Cholelithiasis 03/16/2020   Hypertensive urgency 03/16/2020   Type 2 diabetes mellitus (HCC) 03/16/2020   Lead exposure 11/14/2019   TMJ (temporomandibular joint disorder) 07/31/2019   Left hip pain 02/27/2019   Uterine mass 12/19/2018   Allergic rhinitis with postnasal drip 11/17/2018   Abdominal pain, right lower quadrant 06/23/2018   Diverticulosis of colon without hemorrhage 06/23/2018   Intractable migraine without aura and without status migrainosus 02/16/2018   Iron deficiency anemia 03/29/2017   Vitamin D  deficiency 03/01/2017   Lumbar herniated disc 06/06/2014   Depression    Spinal stenosis, lumbar region, with neurogenic claudication 11/21/2013   Hot flashes 06/30/2011   Eczema, dyshidrotic 06/02/2010   Leg cramps 05/06/2010   FIBROIDS, UTERUS 10/24/2009   GERD 06/09/2009   Chronic pain syndrome 10/04/2008   Hepatitis C carrier (HCC) 03/21/2007   Obesity, Class II, BMI 35-39.9 06/16/2006   HYPERTENSION, BENIGN  SYSTEMIC 06/16/2006    PCP: Nicholas Bar, MD  REFERRING PROVIDER: Dozier Soulier, MD  REFERRING DIAG:  Free Text Diagnosis  S/P RIGHT TSA    Rationale for Evaluation and Treatment: Rehabilitation  THERAPY DIAG:  Right shoulder pain, unspecified chronicity  Stiffness of right shoulder, not elsewhere classified  Muscle weakness (generalized)  PERTINENT HISTORY: GERD, diverticulosis, DM2, HTN  WEIGHT BEARING RESTRICTIONS: No  FALLS:  Has patient fallen in last 6 months? No  LIVING ENVIRONMENT: Lives with: lives alone Lives in: House/apartment Stairs:  No Has following equipment at home: Single point cane  OCCUPATION: Not currently   PRECAUTIONS: Shoulder no lifting more than 5 lb, no IR behind back ---------------------------------------------------------------------------------------------  SUBJECTIVE:                                                                                                                                                                                      SUBJECTIVE STATEMENT: Pt states she felt good after last session. Shoulder blade feels 100% better. Noticed less knots in her arm. 4/10 pain this morning.   Eval statement 12/01/2023: had R TSA on 11/03/2023 Has difficulties with lifting, bathing, and ADLs such as making the bed. Currently 8/10 pain, minimal swelling around surgical site, deep aching pain referred from surgery site to midline of arm   Hand dominance: Right  RED FLAGS: None   PLOF: Independent  PATIENT GOALS: be able to lift the shoulder  NEXT MD VISIT: n/a ---------------------------------------------------------------------------------------------  OBJECTIVE:  Note: Objective measures were completed at Evaluation unless otherwise noted.  DIAGNOSTIC FINDINGS:  N/a  PATIENT SURVEYS :  Quick dash: ~80%  COGNITION: Overall cognitive status: Within functional limits for tasks assessed     SENSATION: WFL  POSTURE: Forward head, increased kyphosis  UPPER EXTREMITY ROM:   ROM Right eval Left eval Right 01/04/2024  Shoulder flexion P: 80d WFL A: 95 sup  Shoulder extension  WFL   Shoulder abduction P: 80d WFL A: 87 sup  Shoulder adduction     Shoulder internal rotation P: 70d WFL   Shoulder external rotation P: 10d WFL A: 25 sitting  Elbow flexion     Elbow extension     Wrist flexion     Wrist extension     Wrist ulnar deviation     Wrist radial deviation     Wrist pronation     Wrist supination     (Blank rows = not tested)  UPPER EXTREMITY FFU:[id number] d/t  pain and recent surgery  MMT Right eval Left eval  Shoulder flexion    Shoulder extension    Shoulder abduction    Shoulder adduction    Shoulder  internal rotation    Shoulder external rotation    Middle trapezius    Lower trapezius    Elbow flexion    Elbow extension    Wrist flexion    Wrist extension    Wrist ulnar deviation    Wrist radial deviation    Wrist pronation    Wrist supination    Grip strength (lbs)    (Blank rows = not tested)  SHOULDER SPECIAL TESTS: deferred  JOINT MOBILITY TESTING:  deferred  PALPATION:  Increased sensitivity around surgical site  TODAY'S TREATMENT: [facility] Adult PT Treatment:                                                DATE: 01/27/2024 Seated Pulley 2 min flexion, 2 min scaption  Grade II to III joint mobs superior, post, inferior along glenohumeral joint using pillow case Grade II gentle shoulder distraction using pillow case Seated shoulder ER AROM 2x10 Seated W 2x10 Standing row red TB 2x10 Standing shoulder ext red TB 2x10 Standing UE ranger flexion x10, scaption x10 UBE L1; 2 min fwd, 2 min bwd  [facility] Adult PT Treatment:                                                DATE: 01/24/2024 Seated Pulley 2 min flexion, 2 min scaption  Seated Scap Retraction 2 x 10  Grade II to III joint mobs superior, post, inferior along glenohumeral joint using pillow case Grade II gentle shoulder distraction using pillow case STM & TPR R bicep and deltoid Seated shoulder ER AROM x10 Seated W x10 Seated low shoulder horizontal abd with scap squeeze x10 Seated bicep/pec stretch x30 Modalities: Pre mod, intensity to pt tolerance for pain management/muscle inhibition bicep and deltoid x 10 min   [facility] Adult PT Treatment:                                                DATE: 01/19/2024 Seated Pulley 2 min flexion, 2 min scaption  Seated Scap Retraction 2 x 10  Seated R scapular clock x10 3, 6, 8 o'clock Self massage with tennis  ball Standing shoulder isometrics into wall all directions 10x3 Standing wall wash flexion, scaption, horizontal abd x10 each Standing row red TB 2x10  [facility] Adult PT Treatment:                                                DATE: 01/17/2024  MHP to R shoulder x 6 minutes at start of visit d/t increased mm tension  Seated Pulley 2 min flexion, 2 min scaption  Seated Scap Retraction 2 x 10  Finger Ladder flexion, abduction x 12 each  Shoulder flexion with dowel (2.5lb) 2 x 10  Supine chest press with dowel (2.5lb) 2 x 10  PROM in all directions to patient tolerance  Scar massage performed by clinician     PATIENT EDUCATION: Education details: Pt received education regarding  HEP performance, ADL performance, functional activity tolerance, impairment education, appropriate performance of therapeutic activities. Person educated: Patient Education method: Explanation, Demonstration, Tactile cues, Verbal cues, and Handouts Education comprehension: verbalized understanding and returned demonstration  HOME EXERCISE PROGRAM: Access Code: [email or web address] URL: [email or web address] Date: 01/27/2024 Prepared by: Tayvien Kane April Earnie Starring  Exercises - Standing Shoulder Row with Anchored Resistance  - 1 x daily - 7 x weekly - 2 sets - 10 reps - Shoulder extension with resistance - Neutral  - 1 x daily - 7 x weekly - 2 sets - 10 reps - Shoulder External Rotation and Scapular Retraction  - 1 x daily - 7 x weekly - 2 sets - 10 reps - Shoulder External Rotation in 45 Degrees Abduction  - 1 x daily - 7 x weekly - 2 sets - 10 reps - Standing Shoulder Abduction Slides at Wall  - 1 x daily - 7 x weekly - 1 sets - 10 reps - Standing Single Shoulder Flexion Wall Slide with Palm Up (Mirrored)  - 1 x daily - 7 x weekly - 1 sets - 10 reps ---------------------------------------------------------------------------------------------  ASSESSMENT:  CLINICAL IMPRESSION: 01/27/2024 Pt with less pain.  Worked on increasing strengthening as tolerated. Focused on posterior shoulder/periscapular strengthening today with postural stability and improving overhead reaching. Updated pt's HEP accordingly.    Eval impression (12/01/2023): Pt. attended today's physical therapy session for evaluation of R TSA. Pt had a R TSA on 11/03/2023, putting her 4 weeks out as of today 12/01/2023. Pt has notable deficits with shoulder mobility, strength, stability, and pain levels. Pt would benefit from therapeutic focus on P/AAROM and periscapular strengthening in line with surgical protocol. Protocol is written out on the 2nd page of the 11/23/2023 referral seen in the media tab on [employer].  Treatment performed today focused on pt education detailed in the objective. Pt demonstrated great understanding of education provided. required minimal v/t cues and no assistance for appropriate performance with today's activities.  Pts next protocol stage will be s/p 6 wks on 12/15/2023. Pt requires the intervention of skilled outpatient physical therapy to address the aforementioned deficits and progress towards a functional level in line with therapeutic goals.    OBJECTIVE IMPAIRMENTS: decreased mobility, decreased strength, hypomobility, impaired UE functional use, improper body mechanics, postural dysfunction, and pain.   ACTIVITY LIMITATIONS: lifting, sleeping, bed mobility, bathing, toileting, dressing, and reach over head  PARTICIPATION LIMITATIONS: cleaning, laundry, driving, shopping, and community activity  PERSONAL FACTORS: Age, Fitness, Past/current experiences, Time since onset of injury/illness/exacerbation, and 1-2 comorbidities: DM2 are also affecting patient's functional outcome.   REHAB POTENTIAL: Good  CLINICAL DECISION MAKING: Stable/uncomplicated  EVALUATION COMPLEXITY: Low  GOALS: Goals reviewed with patient? Yes  SHORT TERM GOALS: Target date: 12/29/2023  Pt will be independent with administered HEP  to demonstrate the competency necessary for long term managemnet of symptoms at home.  Baseline: Goal status: MET Pt reports adherence 12/22/23   LONG TERM GOALS: Target date: 03/06/2024   Pt. Will achieve a DASH score of  40% as to demonstrate improvement in self-perceived functional ability with daily activities.  Baseline: ~80% 01/24/24: QuickDASH Score: 40.9 / 100 = 40.9 % Goal status: IN PROGRESS  2.  Pt will report pain levels improving during ADLs to be less than or equal to 2/10 as to demonstrate improved tolerance with daily functional activities such as bathing and dressing. Baseline: 8/10 01/24/24: at most 7/10  Goal status: IN PROGRESS  3.  Pt will improve MMT score for  R global shoulder strengh to a 4+/5 to demonstrate improvement in strength for quality of motion and activity performance.  Baseline: deferred 01/24/24: 3-/5 Goal status: IN PROGRESS  4.  Pt will improve R shoulder flexion and abduction AROM to 160d to demonstrate volitional mobility neccessary for functional overhead mobility utilized during ADLs such as cleaning and washing. Baseline: see obj chart 01/24/24: shoulder flexion 95, abd 87 in supine Goal status: IN PROGRESS  ---------------------------------------------------------------------------------------------  PLAN: PT FREQUENCY: 1-2x/week  PT DURATION: 6 weeks  PLANNED INTERVENTIONS: 97110-Therapeutic exercises, 97530- Therapeutic activity, [id number]- Neuromuscular re-education, 97535- Self Care, [id number]- Manual therapy, [id number]- Electrical stimulation (unattended), [id number]- Vasopneumatic device, 97033- Ionotophoresis 4mg /ml Dexamethasone , Patient/Family education, Taping, Joint mobilization, DME instructions, Cryotherapy, and Moist heat  PLAN FOR NEXT SESSION: Increase AROM and resistance as able.    Mayme Profeta April Ma L Inetta Dicke, PT, DPT  01/27/2024 10:42 AM

## 2024-01-31 ENCOUNTER — Ambulatory Visit

## 2024-01-31 DIAGNOSIS — M6281 Muscle weakness (generalized): Secondary | ICD-10-CM

## 2024-01-31 DIAGNOSIS — M25511 Pain in right shoulder: Secondary | ICD-10-CM

## 2024-01-31 DIAGNOSIS — M25611 Stiffness of right shoulder, not elsewhere classified: Secondary | ICD-10-CM

## 2024-01-31 NOTE — Therapy (Signed)
 OUTPATIENT PHYSICAL THERAPY NOTE   Patient Name: Norma [name] MRN: [id number] DOB:10-Apr-1961, 63 y.o., female Today's Date: 01/31/2024  END OF SESSION:  PT End of Session - 01/31/24 1136     Visit Number 12    Number of Visits 22    Date for Recertification  03/06/24    Authorization Type [employer] MCR/MCD    PT Start Time 1133    PT Stop Time 1212    PT Time Calculation (min) 39 min    Activity Tolerance Patient tolerated treatment well    Behavior During Therapy WFL for tasks assessed/performed             Past Medical History:  Diagnosis Date   A-fib (HCC)    Acid reflux    Allergy    Anemia    Anxiety    Arthritis    Back pain    Depression    Diabetes mellitus without complication (HCC)    on meds   Dysrhythmia    Fatty liver    Headache    stress headaches, migraines at times   Heart murmur    Hypertension    Irregular heart beat    Left shoulder pain    Shortness of breath dyspnea    with exertion   Tachyarrhythmia    Trigger point of thoracic region 03/22/2012   Past Surgical History:  Procedure Laterality Date   ANKLE SURGERY     CHOLECYSTECTOMY N/A 03/20/2020   Procedure: LAPAROSCOPIC CHOLECYSTECTOMY WITH INTRAOPERATIVE CHOLANGIOGRAM AND WEDGE LIVER BIOPSY;  Surgeon: Gladis Cough, MD;  Location: [facility];  Service: General;  Laterality: N/A;   KNEE SURGERY Left    LUMBAR LAMINECTOMY/DECOMPRESSION MICRODISCECTOMY Left 06/06/2014   Procedure: LUMBAR LAMINECTOMY/DECOMPRESSION MICRODISCECTOMY 1 LEVEL;  Surgeon: Reyes Budge, MD;  Location: [facility];  Service: Neurosurgery;  Laterality: Left;  Left L5S1 microdiskectomy   REVERSE SHOULDER ARTHROPLASTY Right 11/03/2023   Procedure: ARTHROPLASTY, SHOULDER, TOTAL, REVERSE;  Surgeon: Dozier Soulier, MD;  Location: [facility];  Service: Orthopedics;  Laterality: Right;   ROTATOR CUFF REPAIR Bilateral 02/20/2014   TUBAL LIGATION     Patient Active Problem List   Diagnosis Date Noted   Left foot pain  05/17/2023   Elevated serum creatinine 05/17/2023   Epidermal inclusion cyst 12/06/2022   Viral syndrome 04/05/2022   Generalized abdominal cramping 11/27/2021   Tendinopathy of right rotator cuff 04/04/2020   HCV antibody positive 03/18/2020   RUQ pain 03/17/2020   Biliary colic symptom    Elevated liver enzymes    Abnormal findings on diagnostic imaging of liver and biliary tract    Abdominal pain 03/16/2020   Cholelithiasis 03/16/2020   Hypertensive urgency 03/16/2020   Type 2 diabetes mellitus (HCC) 03/16/2020   Lead exposure 11/14/2019   TMJ (temporomandibular joint disorder) 07/31/2019   Left hip pain 02/27/2019   Uterine mass 12/19/2018   Allergic rhinitis with postnasal drip 11/17/2018   Abdominal pain, right lower quadrant 06/23/2018   Diverticulosis of colon without hemorrhage 06/23/2018   Intractable migraine without aura and without status migrainosus 02/16/2018   Iron deficiency anemia 03/29/2017   Vitamin D  deficiency 03/01/2017   Lumbar herniated disc 06/06/2014   Depression    Spinal stenosis, lumbar region, with neurogenic claudication 11/21/2013   Hot flashes 06/30/2011   Eczema, dyshidrotic 06/02/2010   Leg cramps 05/06/2010   FIBROIDS, UTERUS 10/24/2009   GERD 06/09/2009   Chronic pain syndrome 10/04/2008   Hepatitis C carrier (HCC) 03/21/2007   Obesity, Class  II, BMI 35-39.9 06/16/2006   HYPERTENSION, BENIGN SYSTEMIC 06/16/2006    PCP: Nicholas Bar, MD  REFERRING PROVIDER: Dozier Soulier, MD  REFERRING DIAG:  Free Text Diagnosis  S/P RIGHT TSA    Rationale for Evaluation and Treatment: Rehabilitation  THERAPY DIAG:  Right shoulder pain, unspecified chronicity  Stiffness of right shoulder, not elsewhere classified  Muscle weakness (generalized)  PERTINENT HISTORY: GERD, diverticulosis, DM2, HTN  WEIGHT BEARING RESTRICTIONS: No  FALLS:  Has patient fallen in last 6 months? No  LIVING ENVIRONMENT: Lives with: lives alone Lives  in: House/apartment Stairs: No Has following equipment at home: Single point cane  OCCUPATION: Not currently   PRECAUTIONS: Shoulder no lifting more than 5 lb, no IR behind back ---------------------------------------------------------------------------------------------  SUBJECTIVE:                                                                                                                                                                                      SUBJECTIVE STATEMENT: Patient reporting significant improvement with R shoulder pain and mobility.   Eval statement 12/01/2023: had R TSA on 11/03/2023 Has difficulties with lifting, bathing, and ADLs such as making the bed. Currently 8/10 pain, minimal swelling around surgical site, deep aching pain referred from surgery site to midline of arm   Hand dominance: Right  RED FLAGS: None   PLOF: Independent  PATIENT GOALS: be able to lift the shoulder  NEXT MD VISIT: n/a ---------------------------------------------------------------------------------------------  OBJECTIVE:  Note: Objective measures were completed at Evaluation unless otherwise noted.  DIAGNOSTIC FINDINGS:  N/a  PATIENT SURVEYS :  Quick dash: ~80%  COGNITION: Overall cognitive status: Within functional limits for tasks assessed     SENSATION: WFL  POSTURE: Forward head, increased kyphosis  UPPER EXTREMITY ROM:   ROM Right eval Left eval Right 01/04/2024  Shoulder flexion P: 80d WFL A: 95 sup  Shoulder extension  WFL   Shoulder abduction P: 80d WFL A: 87 sup  Shoulder adduction     Shoulder internal rotation P: 70d WFL   Shoulder external rotation P: 10d WFL A: 25 sitting  Elbow flexion     Elbow extension     Wrist flexion     Wrist extension     Wrist ulnar deviation     Wrist radial deviation     Wrist pronation     Wrist supination     (Blank rows = not tested)  UPPER EXTREMITY FFU:[id number] d/t pain and recent surgery  MMT  Right eval Left eval  Shoulder flexion    Shoulder extension    Shoulder abduction    Shoulder adduction    Shoulder internal rotation  Shoulder external rotation    Middle trapezius    Lower trapezius    Elbow flexion    Elbow extension    Wrist flexion    Wrist extension    Wrist ulnar deviation    Wrist radial deviation    Wrist pronation    Wrist supination    Grip strength (lbs)    (Blank rows = not tested)  SHOULDER SPECIAL TESTS: deferred  JOINT MOBILITY TESTING:  deferred  PALPATION:  Increased sensitivity around surgical site  TODAY'S TREATMENT:  [facility] Adult PT Treatment:                                                DATE: 01/31/2024  Seated Pulley 2 min flexion, 2 min scaption  Standing row green TB 2x10 Standing shoulder ext red TB 2x10 Standing shoulder adduction red TB 2 x 10  Standing UE ranger flexion x10, scaption x10 Isometric ER/IR walk outs RTB 2x 6 each  UBE L1; 2 min fwd, 2 min bwd  [facility] Adult PT Treatment:                                                DATE: 01/27/2024 Seated Pulley 2 min flexion, 2 min scaption  Grade II to III joint mobs superior, post, inferior along glenohumeral joint using pillow case Grade II gentle shoulder distraction using pillow case Seated shoulder ER AROM 2x10 Seated W 2x10 Standing row red TB 2x10 Standing shoulder ext red TB 2x10 Standing UE ranger flexion x10, scaption x10 UBE L1; 2 min fwd, 2 min bwd  [facility] Adult PT Treatment:                                                DATE: 01/24/2024 Seated Pulley 2 min flexion, 2 min scaption  Seated Scap Retraction 2 x 10  Grade II to III joint mobs superior, post, inferior along glenohumeral joint using pillow case Grade II gentle shoulder distraction using pillow case STM & TPR R bicep and deltoid Seated shoulder ER AROM x10 Seated W x10 Seated low shoulder horizontal abd with scap squeeze x10 Seated bicep/pec stretch x30 Modalities: Pre mod,  intensity to pt tolerance for pain management/muscle inhibition bicep and deltoid x 10 min   [facility] Adult PT Treatment:                                                DATE: 01/19/2024 Seated Pulley 2 min flexion, 2 min scaption  Seated Scap Retraction 2 x 10  Seated R scapular clock x10 3, 6, 8 o'clock Self massage with tennis ball Standing shoulder isometrics into wall all directions 10x3 Standing wall wash flexion, scaption, horizontal abd x10 each Standing row red TB 2x10  [facility] Adult PT Treatment:  DATE: 01/17/2024  MHP to R shoulder x 6 minutes at start of visit d/t increased mm tension  Seated Pulley 2 min flexion, 2 min scaption  Seated Scap Retraction 2 x 10  Finger Ladder flexion, abduction x 12 each  Shoulder flexion with dowel (2.5lb) 2 x 10  Supine chest press with dowel (2.5lb) 2 x 10  PROM in all directions to patient tolerance  Scar massage performed by clinician     PATIENT EDUCATION: Education details: Pt received education regarding HEP performance, ADL performance, functional activity tolerance, impairment education, appropriate performance of therapeutic activities. Person educated: Patient Education method: Explanation, Demonstration, Tactile cues, Verbal cues, and Handouts Education comprehension: verbalized understanding and returned demonstration  HOME EXERCISE PROGRAM: Access Code: [email or web address] URL: [email or web address] Date: 01/27/2024 Prepared by: Gellen April Earnie Starring  Exercises - Standing Shoulder Row with Anchored Resistance  - 1 x daily - 7 x weekly - 2 sets - 10 reps - Shoulder extension with resistance - Neutral  - 1 x daily - 7 x weekly - 2 sets - 10 reps - Shoulder External Rotation and Scapular Retraction  - 1 x daily - 7 x weekly - 2 sets - 10 reps - Shoulder External Rotation in 45 Degrees Abduction  - 1 x daily - 7 x weekly - 2 sets - 10 reps - Standing Shoulder Abduction Slides  at Wall  - 1 x daily - 7 x weekly - 1 sets - 10 reps - Standing Single Shoulder Flexion Wall Slide with Palm Up (Mirrored)  - 1 x daily - 7 x weekly - 1 sets - 10 reps ---------------------------------------------------------------------------------------------  ASSESSMENT:  CLINICAL IMPRESSION: 01/31/2024 Anisten had good tolerance of today's treatment session, with progression of shoulder and periscapular stabilization exercises today. Patient had some mm fatigue at end of session, but denies worsening of pain severity. We will continue to progress per POC as tolerated, in order to reach established rehab goals.      Eval impression (12/01/2023): Pt. attended today's physical therapy session for evaluation of R TSA. Pt had a R TSA on 11/03/2023, putting her 4 weeks out as of today 12/01/2023. Pt has notable deficits with shoulder mobility, strength, stability, and pain levels. Pt would benefit from therapeutic focus on P/AAROM and periscapular strengthening in line with surgical protocol. Protocol is written out on the 2nd page of the 11/23/2023 referral seen in the media tab on [employer].  Treatment performed today focused on pt education detailed in the objective. Pt demonstrated great understanding of education provided. required minimal v/t cues and no assistance for appropriate performance with today's activities.  Pts next protocol stage will be s/p 6 wks on 12/15/2023. Pt requires the intervention of skilled outpatient physical therapy to address the aforementioned deficits and progress towards a functional level in line with therapeutic goals.    OBJECTIVE IMPAIRMENTS: decreased mobility, decreased strength, hypomobility, impaired UE functional use, improper body mechanics, postural dysfunction, and pain.   ACTIVITY LIMITATIONS: lifting, sleeping, bed mobility, bathing, toileting, dressing, and reach over head  PARTICIPATION LIMITATIONS: cleaning, laundry, driving, shopping, and community  activity  PERSONAL FACTORS: Age, Fitness, Past/current experiences, Time since onset of injury/illness/exacerbation, and 1-2 comorbidities: DM2 are also affecting patient's functional outcome.   REHAB POTENTIAL: Good  CLINICAL DECISION MAKING: Stable/uncomplicated  EVALUATION COMPLEXITY: Low  GOALS: Goals reviewed with patient? Yes  SHORT TERM GOALS: Target date: 12/29/2023  Pt will be independent with administered HEP to demonstrate the competency necessary for long term managemnet of symptoms  at home.  Baseline: Goal status: MET Pt reports adherence 12/22/23   LONG TERM GOALS: Target date: 03/06/2024   Pt. Will achieve a DASH score of  40% as to demonstrate improvement in self-perceived functional ability with daily activities.  Baseline: ~80% 01/24/24: QuickDASH Score: 40.9 / 100 = 40.9 % Goal status: IN PROGRESS  2.  Pt will report pain levels improving during ADLs to be less than or equal to 2/10 as to demonstrate improved tolerance with daily functional activities such as bathing and dressing. Baseline: 8/10 01/24/24: at most 7/10  Goal status: IN PROGRESS  3.  Pt will improve MMT score for R global shoulder strengh to a 4+/5 to demonstrate improvement in strength for quality of motion and activity performance.  Baseline: deferred 01/24/24: 3-/5 Goal status: IN PROGRESS  4.  Pt will improve R shoulder flexion and abduction AROM to 160d to demonstrate volitional mobility neccessary for functional overhead mobility utilized during ADLs such as cleaning and washing. Baseline: see obj chart 01/24/24: shoulder flexion 95, abd 87 in supine Goal status: IN PROGRESS  ---------------------------------------------------------------------------------------------  PLAN: PT FREQUENCY: 1-2x/week  PT DURATION: 6 weeks  PLANNED INTERVENTIONS: 97110-Therapeutic exercises, 97530- Therapeutic activity, [id number]- Neuromuscular re-education, 97535- Self Care, [id number]- Manual therapy, [id number]-  Electrical stimulation (unattended), [id number]- Vasopneumatic device, 97033- Ionotophoresis 4mg /ml Dexamethasone , Patient/Family education, Taping, Joint mobilization, DME instructions, Cryotherapy, and Moist heat  PLAN FOR NEXT SESSION: Increase AROM and resistance as able.    Marko Molt, PT, DPT  01/31/2024 12:31 PM

## 2024-02-03 ENCOUNTER — Ambulatory Visit

## 2024-02-07 ENCOUNTER — Other Ambulatory Visit: Payer: Self-pay | Admitting: Family Medicine

## 2024-02-07 ENCOUNTER — Ambulatory Visit

## 2024-02-07 DIAGNOSIS — M545 Low back pain, unspecified: Secondary | ICD-10-CM

## 2024-02-07 DIAGNOSIS — M25611 Stiffness of right shoulder, not elsewhere classified: Secondary | ICD-10-CM

## 2024-02-07 DIAGNOSIS — M25511 Pain in right shoulder: Secondary | ICD-10-CM

## 2024-02-07 NOTE — Therapy (Signed)
 OUTPATIENT PHYSICAL THERAPY NOTE   Patient Name: Norma [name] MRN: [id number] DOB:Jul 05, 1960, 63 y.o., female Today's Date: 02/07/2024  END OF SESSION:  PT End of Session - 02/07/24 1129     Visit Number 13    Number of Visits 22    Date for Recertification  03/06/24    Authorization Type [employer] MCR/MCD    PT Start Time 1131    PT Stop Time 1210    PT Time Calculation (min) 39 min    Activity Tolerance Patient tolerated treatment well    Behavior During Therapy WFL for tasks assessed/performed              Past Medical History:  Diagnosis Date   A-fib (HCC)    Acid reflux    Allergy    Anemia    Anxiety    Arthritis    Back pain    Depression    Diabetes mellitus without complication (HCC)    on meds   Dysrhythmia    Fatty liver    Headache    stress headaches, migraines at times   Heart murmur    Hypertension    Irregular heart beat    Left shoulder pain    Shortness of breath dyspnea    with exertion   Tachyarrhythmia    Trigger point of thoracic region 03/22/2012   Past Surgical History:  Procedure Laterality Date   ANKLE SURGERY     CHOLECYSTECTOMY N/A 03/20/2020   Procedure: LAPAROSCOPIC CHOLECYSTECTOMY WITH INTRAOPERATIVE CHOLANGIOGRAM AND WEDGE LIVER BIOPSY;  Surgeon: Gladis Cough, MD;  Location: [facility];  Service: General;  Laterality: N/A;   KNEE SURGERY Left    LUMBAR LAMINECTOMY/DECOMPRESSION MICRODISCECTOMY Left 06/06/2014   Procedure: LUMBAR LAMINECTOMY/DECOMPRESSION MICRODISCECTOMY 1 LEVEL;  Surgeon: Reyes Budge, MD;  Location: [facility];  Service: Neurosurgery;  Laterality: Left;  Left L5S1 microdiskectomy   REVERSE SHOULDER ARTHROPLASTY Right 11/03/2023   Procedure: ARTHROPLASTY, SHOULDER, TOTAL, REVERSE;  Surgeon: Dozier Soulier, MD;  Location: [facility];  Service: Orthopedics;  Laterality: Right;   ROTATOR CUFF REPAIR Bilateral 02/20/2014   TUBAL LIGATION     Patient Active Problem List   Diagnosis Date Noted   Left foot  pain 05/17/2023   Elevated serum creatinine 05/17/2023   Epidermal inclusion cyst 12/06/2022   Viral syndrome 04/05/2022   Generalized abdominal cramping 11/27/2021   Tendinopathy of right rotator cuff 04/04/2020   HCV antibody positive 03/18/2020   RUQ pain 03/17/2020   Biliary colic symptom    Elevated liver enzymes    Abnormal findings on diagnostic imaging of liver and biliary tract    Abdominal pain 03/16/2020   Cholelithiasis 03/16/2020   Hypertensive urgency 03/16/2020   Type 2 diabetes mellitus (HCC) 03/16/2020   Lead exposure 11/14/2019   TMJ (temporomandibular joint disorder) 07/31/2019   Left hip pain 02/27/2019   Uterine mass 12/19/2018   Allergic rhinitis with postnasal drip 11/17/2018   Abdominal pain, right lower quadrant 06/23/2018   Diverticulosis of colon without hemorrhage 06/23/2018   Intractable migraine without aura and without status migrainosus 02/16/2018   Iron deficiency anemia 03/29/2017   Vitamin D  deficiency 03/01/2017   Lumbar herniated disc 06/06/2014   Depression    Spinal stenosis, lumbar region, with neurogenic claudication 11/21/2013   Hot flashes 06/30/2011   Eczema, dyshidrotic 06/02/2010   Leg cramps 05/06/2010   FIBROIDS, UTERUS 10/24/2009   GERD 06/09/2009   Chronic pain syndrome 10/04/2008   Hepatitis C carrier (HCC) 03/21/2007   Obesity,  Class II, BMI 35-39.9 06/16/2006   HYPERTENSION, BENIGN SYSTEMIC 06/16/2006    PCP: Nicholas Bar, MD  REFERRING PROVIDER: Dozier Soulier, MD  REFERRING DIAG:  Free Text Diagnosis  S/P RIGHT TSA    Rationale for Evaluation and Treatment: Rehabilitation  THERAPY DIAG:  Right shoulder pain, unspecified chronicity  Stiffness of right shoulder, not elsewhere classified  PERTINENT HISTORY: GERD, diverticulosis, DM2, HTN  WEIGHT BEARING RESTRICTIONS: No  FALLS:  Has patient fallen in last 6 months? No  LIVING ENVIRONMENT: Lives with: lives alone Lives in: House/apartment Stairs:  No Has following equipment at home: Single point cane  OCCUPATION: Not currently   PRECAUTIONS: Shoulder no lifting more than 5 lb, no IR behind back ---------------------------------------------------------------------------------------------  SUBJECTIVE:                                                                                                                                                                                      SUBJECTIVE STATEMENT: Patient reporting that she had some pain over the weekend and return to use of pain patch  Eval statement 12/01/2023: had R TSA on 11/03/2023 Has difficulties with lifting, bathing, and ADLs such as making the bed. Currently 8/10 pain, minimal swelling around surgical site, deep aching pain referred from surgery site to midline of arm   Hand dominance: Right  RED FLAGS: None   PLOF: Independent  PATIENT GOALS: be able to lift the shoulder  NEXT MD VISIT: n/a ---------------------------------------------------------------------------------------------  OBJECTIVE:  Note: Objective measures were completed at Evaluation unless otherwise noted.  DIAGNOSTIC FINDINGS:  N/a  PATIENT SURVEYS :  Quick dash: ~80%  COGNITION: Overall cognitive status: Within functional limits for tasks assessed     SENSATION: WFL  POSTURE: Forward head, increased kyphosis  UPPER EXTREMITY ROM:   ROM Right eval Left eval Right 01/04/2024  Shoulder flexion P: 80d WFL A: 95 sup  Shoulder extension  WFL   Shoulder abduction P: 80d WFL A: 87 sup  Shoulder adduction     Shoulder internal rotation P: 70d WFL   Shoulder external rotation P: 10d WFL A: 25 sitting  Elbow flexion     Elbow extension     Wrist flexion     Wrist extension     Wrist ulnar deviation     Wrist radial deviation     Wrist pronation     Wrist supination     (Blank rows = not tested)  UPPER EXTREMITY FFU:[id number] d/t pain and recent surgery  MMT Right eval  Left eval  Shoulder flexion    Shoulder extension    Shoulder abduction    Shoulder adduction    Shoulder internal rotation  Shoulder external rotation    Middle trapezius    Lower trapezius    Elbow flexion    Elbow extension    Wrist flexion    Wrist extension    Wrist ulnar deviation    Wrist radial deviation    Wrist pronation    Wrist supination    Grip strength (lbs)    (Blank rows = not tested)  SHOULDER SPECIAL TESTS: deferred  JOINT MOBILITY TESTING:  deferred  PALPATION:  Increased sensitivity around surgical site  TODAY'S TREATMENT:  [facility] Adult PT Treatment:                                                DATE: 02/07/2024  Therapeutic Exercise  UBE L2; 3 min fwd, 3 min bwd Standing row green TB 2x10 Standing shoulder ext red TB 2x10 Standing shoulder adduction red TB 2 x 10  Standing UE ranger flexion x10, scaption x10 Standing shoulder abduction yellow TB 2 x 10   Neuromuscular Reed Seated shoulder flexion with 2# dumbbell 2 x 5 Seated shoulder scaption with 2# dumbbell 2 x 5   [facility] Adult PT Treatment:                                                DATE: 01/31/2024  Seated Pulley 2 min flexion, 2 min scaption  Standing row green TB 2x10 Standing shoulder ext red TB 2x10 Standing shoulder adduction red TB 2 x 10  Standing UE ranger flexion x10, scaption x10 Isometric ER/IR walk outs RTB 2x 6 each  UBE L1; 2 min fwd, 2 min bwd  [facility] Adult PT Treatment:                                                DATE: 01/27/2024 Seated Pulley 2 min flexion, 2 min scaption  Grade II to III joint mobs superior, post, inferior along glenohumeral joint using pillow case Grade II gentle shoulder distraction using pillow case Seated shoulder ER AROM 2x10 Seated W 2x10 Standing row red TB 2x10 Standing shoulder ext red TB 2x10 Standing UE ranger flexion x10, scaption x10 UBE L1; 2 min fwd, 2 min bwd  [facility] Adult PT Treatment:                                                 DATE: 01/24/2024 Seated Pulley 2 min flexion, 2 min scaption  Seated Scap Retraction 2 x 10  Grade II to III joint mobs superior, post, inferior along glenohumeral joint using pillow case Grade II gentle shoulder distraction using pillow case STM & TPR R bicep and deltoid Seated shoulder ER AROM x10 Seated W x10 Seated low shoulder horizontal abd with scap squeeze x10 Seated bicep/pec stretch x30 Modalities: Pre mod, intensity to pt tolerance for pain management/muscle inhibition bicep and deltoid x 10 min   [facility] Adult PT Treatment:  DATE: 01/19/2024 Seated Pulley 2 min flexion, 2 min scaption  Seated Scap Retraction 2 x 10  Seated R scapular clock x10 3, 6, 8 o'clock Self massage with tennis ball Standing shoulder isometrics into wall all directions 10x3 Standing wall wash flexion, scaption, horizontal abd x10 each Standing row red TB 2x10  [facility] Adult PT Treatment:                                                DATE: 01/17/2024  MHP to R shoulder x 6 minutes at start of visit d/t increased mm tension  Seated Pulley 2 min flexion, 2 min scaption  Seated Scap Retraction 2 x 10  Finger Ladder flexion, abduction x 12 each  Shoulder flexion with dowel (2.5lb) 2 x 10  Supine chest press with dowel (2.5lb) 2 x 10  PROM in all directions to patient tolerance  Scar massage performed by clinician     PATIENT EDUCATION: Education details: Pt received education regarding HEP performance, ADL performance, functional activity tolerance, impairment education, appropriate performance of therapeutic activities. Person educated: Patient Education method: Explanation, Demonstration, Tactile cues, Verbal cues, and Handouts Education comprehension: verbalized understanding and returned demonstration  HOME EXERCISE PROGRAM: Access Code: [email or web address] URL: [email or web address] Date: 01/27/2024 Prepared by: Gellen April  Earnie Starring  Exercises - Standing Shoulder Row with Anchored Resistance  - 1 x daily - 7 x weekly - 2 sets - 10 reps - Shoulder extension with resistance - Neutral  - 1 x daily - 7 x weekly - 2 sets - 10 reps - Shoulder External Rotation and Scapular Retraction  - 1 x daily - 7 x weekly - 2 sets - 10 reps - Shoulder External Rotation in 45 Degrees Abduction  - 1 x daily - 7 x weekly - 2 sets - 10 reps - Standing Shoulder Abduction Slides at Wall  - 1 x daily - 7 x weekly - 1 sets - 10 reps - Standing Single Shoulder Flexion Wall Slide with Palm Up (Mirrored)  - 1 x daily - 7 x weekly - 1 sets - 10 reps ---------------------------------------------------------------------------------------------  ASSESSMENT:  CLINICAL IMPRESSION: 02/07/2024 Patient continues to tolerate current POC fairly well. She does remain limited by pain, including with elevation >45 degrees. Plan is to continue progression of strengthening program in order to promote safe and independent function.      Eval impression (12/01/2023): Pt. attended today's physical therapy session for evaluation of R TSA. Pt had a R TSA on 11/03/2023, putting her 4 weeks out as of today 12/01/2023. Pt has notable deficits with shoulder mobility, strength, stability, and pain levels. Pt would benefit from therapeutic focus on P/AAROM and periscapular strengthening in line with surgical protocol. Protocol is written out on the 2nd page of the 11/23/2023 referral seen in the media tab on [employer].  Treatment performed today focused on pt education detailed in the objective. Pt demonstrated great understanding of education provided. required minimal v/t cues and no assistance for appropriate performance with today's activities.  Pts next protocol stage will be s/p 6 wks on 12/15/2023. Pt requires the intervention of skilled outpatient physical therapy to address the aforementioned deficits and progress towards a functional level in line with therapeutic  goals.    OBJECTIVE IMPAIRMENTS: decreased mobility, decreased strength, hypomobility, impaired UE functional use, improper body mechanics, postural dysfunction, and pain.  ACTIVITY LIMITATIONS: lifting, sleeping, bed mobility, bathing, toileting, dressing, and reach over head  PARTICIPATION LIMITATIONS: cleaning, laundry, driving, shopping, and community activity  PERSONAL FACTORS: Age, Fitness, Past/current experiences, Time since onset of injury/illness/exacerbation, and 1-2 comorbidities: DM2 are also affecting patient's functional outcome.   REHAB POTENTIAL: Good  CLINICAL DECISION MAKING: Stable/uncomplicated  EVALUATION COMPLEXITY: Low  GOALS: Goals reviewed with patient? Yes  SHORT TERM GOALS: Target date: 12/29/2023  Pt will be independent with administered HEP to demonstrate the competency necessary for long term managemnet of symptoms at home.  Baseline: Goal status: MET Pt reports adherence 12/22/23   LONG TERM GOALS: Target date: 03/06/2024   Pt. Will achieve a DASH score of  40% as to demonstrate improvement in self-perceived functional ability with daily activities.  Baseline: ~80% 01/24/24: QuickDASH Score: 40.9 / 100 = 40.9 % Goal status: IN PROGRESS  2.  Pt will report pain levels improving during ADLs to be less than or equal to 2/10 as to demonstrate improved tolerance with daily functional activities such as bathing and dressing. Baseline: 8/10 01/24/24: at most 7/10  Goal status: IN PROGRESS  3.  Pt will improve MMT score for R global shoulder strengh to a 4+/5 to demonstrate improvement in strength for quality of motion and activity performance.  Baseline: deferred 01/24/24: 3-/5 Goal status: IN PROGRESS  4.  Pt will improve R shoulder flexion and abduction AROM to 160d to demonstrate volitional mobility neccessary for functional overhead mobility utilized during ADLs such as cleaning and washing. Baseline: see obj chart 01/24/24: shoulder flexion 95,  abd 87 in supine Goal status: IN PROGRESS  ---------------------------------------------------------------------------------------------  PLAN: PT FREQUENCY: 1-2x/week  PT DURATION: 6 weeks  PLANNED INTERVENTIONS: 97110-Therapeutic exercises, 97530- Therapeutic activity, [id number]- Neuromuscular re-education, 97535- Self Care, [id number]- Manual therapy, [id number]- Electrical stimulation (unattended), 97016- Vasopneumatic device, 97033- Ionotophoresis 4mg /ml Dexamethasone , Patient/Family education, Taping, Joint mobilization, DME instructions, Cryotherapy, and Moist heat  PLAN FOR NEXT SESSION: Increase AROM and resistance as able.    Marko Molt, PT, DPT  02/07/2024 12:16 PM

## 2024-02-09 ENCOUNTER — Ambulatory Visit

## 2024-02-09 DIAGNOSIS — M25511 Pain in right shoulder: Secondary | ICD-10-CM

## 2024-02-09 DIAGNOSIS — M25611 Stiffness of right shoulder, not elsewhere classified: Secondary | ICD-10-CM

## 2024-02-09 DIAGNOSIS — M6281 Muscle weakness (generalized): Secondary | ICD-10-CM

## 2024-02-09 NOTE — Therapy (Signed)
 OUTPATIENT PHYSICAL THERAPY NOTE   Patient Name: Norma [name] MRN: [id number] DOB:1961/01/04, 63 y.o., female Today's Date: 02/09/2024  END OF SESSION:  PT End of Session - 02/09/24 1140     Visit Number 14    Number of Visits 22    Date for Recertification  03/06/24    Authorization Type [employer] MCR/MCD    PT Start Time 1133    PT Stop Time 1211    PT Time Calculation (min) 38 min    Activity Tolerance Patient tolerated treatment well    Behavior During Therapy WFL for tasks assessed/performed               Past Medical History:  Diagnosis Date   A-fib (HCC)    Acid reflux    Allergy    Anemia    Anxiety    Arthritis    Back pain    Depression    Diabetes mellitus without complication (HCC)    on meds   Dysrhythmia    Fatty liver    Headache    stress headaches, migraines at times   Heart murmur    Hypertension    Irregular heart beat    Left shoulder pain    Shortness of breath dyspnea    with exertion   Tachyarrhythmia    Trigger point of thoracic region 03/22/2012   Past Surgical History:  Procedure Laterality Date   ANKLE SURGERY     CHOLECYSTECTOMY N/A 03/20/2020   Procedure: LAPAROSCOPIC CHOLECYSTECTOMY WITH INTRAOPERATIVE CHOLANGIOGRAM AND WEDGE LIVER BIOPSY;  Surgeon: Gladis Cough, MD;  Location: [facility];  Service: General;  Laterality: N/A;   KNEE SURGERY Left    LUMBAR LAMINECTOMY/DECOMPRESSION MICRODISCECTOMY Left 06/06/2014   Procedure: LUMBAR LAMINECTOMY/DECOMPRESSION MICRODISCECTOMY 1 LEVEL;  Surgeon: Reyes Budge, MD;  Location: [facility];  Service: Neurosurgery;  Laterality: Left;  Left L5S1 microdiskectomy   REVERSE SHOULDER ARTHROPLASTY Right 11/03/2023   Procedure: ARTHROPLASTY, SHOULDER, TOTAL, REVERSE;  Surgeon: Dozier Soulier, MD;  Location: [facility];  Service: Orthopedics;  Laterality: Right;   ROTATOR CUFF REPAIR Bilateral 02/20/2014   TUBAL LIGATION     Patient Active Problem List   Diagnosis Date Noted   Left foot  pain 05/17/2023   Elevated serum creatinine 05/17/2023   Epidermal inclusion cyst 12/06/2022   Viral syndrome 04/05/2022   Generalized abdominal cramping 11/27/2021   Tendinopathy of right rotator cuff 04/04/2020   HCV antibody positive 03/18/2020   RUQ pain 03/17/2020   Biliary colic symptom    Elevated liver enzymes    Abnormal findings on diagnostic imaging of liver and biliary tract    Abdominal pain 03/16/2020   Cholelithiasis 03/16/2020   Hypertensive urgency 03/16/2020   Type 2 diabetes mellitus (HCC) 03/16/2020   Lead exposure 11/14/2019   TMJ (temporomandibular joint disorder) 07/31/2019   Left hip pain 02/27/2019   Uterine mass 12/19/2018   Allergic rhinitis with postnasal drip 11/17/2018   Abdominal pain, right lower quadrant 06/23/2018   Diverticulosis of colon without hemorrhage 06/23/2018   Intractable migraine without aura and without status migrainosus 02/16/2018   Iron deficiency anemia 03/29/2017   Vitamin D  deficiency 03/01/2017   Lumbar herniated disc 06/06/2014   Depression    Spinal stenosis, lumbar region, with neurogenic claudication 11/21/2013   Hot flashes 06/30/2011   Eczema, dyshidrotic 06/02/2010   Leg cramps 05/06/2010   FIBROIDS, UTERUS 10/24/2009   GERD 06/09/2009   Chronic pain syndrome 10/04/2008   Hepatitis C carrier (HCC) 03/21/2007  Obesity, Class II, BMI 35-39.9 06/16/2006   HYPERTENSION, BENIGN SYSTEMIC 06/16/2006    PCP: Nicholas Bar, MD  REFERRING PROVIDER: Dozier Soulier, MD  REFERRING DIAG:  Free Text Diagnosis  S/P RIGHT TSA    Rationale for Evaluation and Treatment: Rehabilitation  THERAPY DIAG:  Right shoulder pain, unspecified chronicity  Stiffness of right shoulder, not elsewhere classified  Muscle weakness (generalized)  PERTINENT HISTORY: GERD, diverticulosis, DM2, HTN  WEIGHT BEARING RESTRICTIONS: No  FALLS:  Has patient fallen in last 6 months? No  LIVING ENVIRONMENT: Lives with: lives  alone Lives in: House/apartment Stairs: No Has following equipment at home: Single point cane  OCCUPATION: Not currently   PRECAUTIONS: Shoulder no lifting more than 5 lb, no IR behind back ---------------------------------------------------------------------------------------------  SUBJECTIVE:                                                                                                                                                                                      SUBJECTIVE STATEMENT: Patient reporting some soreness after last visit.   Eval statement 12/01/2023: had R TSA on 11/03/2023 Has difficulties with lifting, bathing, and ADLs such as making the bed. Currently 8/10 pain, minimal swelling around surgical site, deep aching pain referred from surgery site to midline of arm   Hand dominance: Right  RED FLAGS: None   PLOF: Independent  PATIENT GOALS: be able to lift the shoulder  NEXT MD VISIT: n/a ---------------------------------------------------------------------------------------------  OBJECTIVE:  Note: Objective measures were completed at Evaluation unless otherwise noted.  DIAGNOSTIC FINDINGS:  N/a  PATIENT SURVEYS :  Quick dash: ~80%  COGNITION: Overall cognitive status: Within functional limits for tasks assessed     SENSATION: WFL  POSTURE: Forward head, increased kyphosis  UPPER EXTREMITY ROM:   ROM Right eval Left eval Right 01/04/2024  Shoulder flexion P: 80d WFL A: 95 sup  Shoulder extension  WFL   Shoulder abduction P: 80d WFL A: 87 sup  Shoulder adduction     Shoulder internal rotation P: 70d WFL   Shoulder external rotation P: 10d WFL A: 25 sitting  Elbow flexion     Elbow extension     Wrist flexion     Wrist extension     Wrist ulnar deviation     Wrist radial deviation     Wrist pronation     Wrist supination     (Blank rows = not tested)  UPPER EXTREMITY FFU:[id number] d/t pain and recent surgery  MMT Right eval  Left eval  Shoulder flexion    Shoulder extension    Shoulder abduction    Shoulder adduction    Shoulder internal rotation    Shoulder  external rotation    Middle trapezius    Lower trapezius    Elbow flexion    Elbow extension    Wrist flexion    Wrist extension    Wrist ulnar deviation    Wrist radial deviation    Wrist pronation    Wrist supination    Grip strength (lbs)    (Blank rows = not tested)  SHOULDER SPECIAL TESTS: deferred  JOINT MOBILITY TESTING:  deferred  PALPATION:  Increased sensitivity around surgical site  TODAY'S TREATMENT:  [facility] Adult PT Treatment:                                                DATE: 02/09/2024  Therapeutic Exercise  UBE L2; 3 min fwd, 3 min bwd Pball rolling flexion at wall, 2 x 10 Pball rolling scaption at wall, 2 x 10  Standing row green TB x 20 Standing shoulder ext red TB 2x10 Standing shoulder adduction red TB 2 x 10  Standing shoulder abduction red TB 2 x 10  Seated shoulder flexion with 2# dumbbell 2 x 5 Seated shoulder scaption with 2# dumbbell 2 x 5  [facility] Adult PT Treatment:                                                DATE: 02/07/2024  Therapeutic Exercise  UBE L2; 3 min fwd, 3 min bwd Standing row green TB 2x10 Standing shoulder ext red TB 2x10 Standing shoulder adduction red TB 2 x 10  Standing UE ranger flexion x10, scaption x10 Standing shoulder abduction yellow TB 2 x 10   Neuromuscular Reed Seated shoulder flexion with 2# dumbbell 2 x 5 Seated shoulder scaption with 2# dumbbell 2 x 5   [facility] Adult PT Treatment:                                                DATE: 01/31/2024  Seated Pulley 2 min flexion, 2 min scaption  Standing row green TB 2x10 Standing shoulder ext red TB 2x10 Standing shoulder adduction red TB 2 x 10  Standing UE ranger flexion x10, scaption x10 Isometric ER/IR walk outs RTB 2x 6 each  UBE L1; 2 min fwd, 2 min bwd  [facility] Adult PT Treatment:                                                 DATE: 01/27/2024 Seated Pulley 2 min flexion, 2 min scaption  Grade II to III joint mobs superior, post, inferior along glenohumeral joint using pillow case Grade II gentle shoulder distraction using pillow case Seated shoulder ER AROM 2x10 Seated W 2x10 Standing row red TB 2x10 Standing shoulder ext red TB 2x10 Standing UE ranger flexion x10, scaption x10 UBE L1; 2 min fwd, 2 min bwd  [facility] Adult PT Treatment:  DATE: 01/24/2024 Seated Pulley 2 min flexion, 2 min scaption  Seated Scap Retraction 2 x 10  Grade II to III joint mobs superior, post, inferior along glenohumeral joint using pillow case Grade II gentle shoulder distraction using pillow case STM & TPR R bicep and deltoid Seated shoulder ER AROM x10 Seated W x10 Seated low shoulder horizontal abd with scap squeeze x10 Seated bicep/pec stretch x30 Modalities: Pre mod, intensity to pt tolerance for pain management/muscle inhibition bicep and deltoid x 10 min   [facility] Adult PT Treatment:                                                DATE: 01/19/2024 Seated Pulley 2 min flexion, 2 min scaption  Seated Scap Retraction 2 x 10  Seated R scapular clock x10 3, 6, 8 o'clock Self massage with tennis ball Standing shoulder isometrics into wall all directions 10x3 Standing wall wash flexion, scaption, horizontal abd x10 each Standing row red TB 2x10  [facility] Adult PT Treatment:                                                DATE: 01/17/2024  MHP to R shoulder x 6 minutes at start of visit d/t increased mm tension  Seated Pulley 2 min flexion, 2 min scaption  Seated Scap Retraction 2 x 10  Finger Ladder flexion, abduction x 12 each  Shoulder flexion with dowel (2.5lb) 2 x 10  Supine chest press with dowel (2.5lb) 2 x 10  PROM in all directions to patient tolerance  Scar massage performed by clinician     PATIENT EDUCATION: Education details: Pt received education  regarding HEP performance, ADL performance, functional activity tolerance, impairment education, appropriate performance of therapeutic activities. Person educated: Patient Education method: Explanation, Demonstration, Tactile cues, Verbal cues, and Handouts Education comprehension: verbalized understanding and returned demonstration  HOME EXERCISE PROGRAM: Access Code: [email or web address] URL: [email or web address] Date: 01/27/2024 Prepared by: Gellen April Earnie Starring  Exercises - Standing Shoulder Row with Anchored Resistance  - 1 x daily - 7 x weekly - 2 sets - 10 reps - Shoulder extension with resistance - Neutral  - 1 x daily - 7 x weekly - 2 sets - 10 reps - Shoulder External Rotation and Scapular Retraction  - 1 x daily - 7 x weekly - 2 sets - 10 reps - Shoulder External Rotation in 45 Degrees Abduction  - 1 x daily - 7 x weekly - 2 sets - 10 reps - Standing Shoulder Abduction Slides at Wall  - 1 x daily - 7 x weekly - 1 sets - 10 reps - Standing Single Shoulder Flexion Wall Slide with Palm Up (Mirrored)  - 1 x daily - 7 x weekly - 1 sets - 10 reps ---------------------------------------------------------------------------------------------  ASSESSMENT:  CLINICAL IMPRESSION: 02/09/2024 Patient continues to tolerate current POC fairly well. She does remain limited by pain, including with elevation >45 degrees. Plan is to continue progression of strengthening program in order to promote safe and independent function.      Eval impression (12/01/2023): Pt. attended today's physical therapy session for evaluation of R TSA. Pt had a R TSA on 11/03/2023, putting her 4 weeks out as of today 12/01/2023.  Pt has notable deficits with shoulder mobility, strength, stability, and pain levels. Pt would benefit from therapeutic focus on P/AAROM and periscapular strengthening in line with surgical protocol. Protocol is written out on the 2nd page of the 11/23/2023 referral seen in the media tab  on [facility].  Treatment performed today focused on pt education detailed in the objective. Pt demonstrated great understanding of education provided. required minimal v/t cues and no assistance for appropriate performance with today's activities.  Pts next protocol stage will be s/p 6 wks on 12/15/2023. Pt requires the intervention of skilled outpatient physical therapy to address the aforementioned deficits and progress towards a functional level in line with therapeutic goals.    OBJECTIVE IMPAIRMENTS: decreased mobility, decreased strength, hypomobility, impaired UE functional use, improper body mechanics, postural dysfunction, and pain.   ACTIVITY LIMITATIONS: lifting, sleeping, bed mobility, bathing, toileting, dressing, and reach over head  PARTICIPATION LIMITATIONS: cleaning, laundry, driving, shopping, and community activity  PERSONAL FACTORS: Age, Fitness, Past/current experiences, Time since onset of injury/illness/exacerbation, and 1-2 comorbidities: DM2 are also affecting patient's functional outcome.   REHAB POTENTIAL: Good  CLINICAL DECISION MAKING: Stable/uncomplicated  EVALUATION COMPLEXITY: Low  GOALS: Goals reviewed with patient? Yes  SHORT TERM GOALS: Target date: 12/29/2023  Pt will be independent with administered HEP to demonstrate the competency necessary for long term managemnet of symptoms at home.  Baseline: Goal status: MET Pt reports adherence 12/22/23   LONG TERM GOALS: Target date: 03/06/2024   Pt. Will achieve a DASH score of  40% as to demonstrate improvement in self-perceived functional ability with daily activities.  Baseline: ~80% 01/24/24: QuickDASH Score: 40.9 / 100 = 40.9 % Goal status: IN PROGRESS  2.  Pt will report pain levels improving during ADLs to be less than or equal to 2/10 as to demonstrate improved tolerance with daily functional activities such as bathing and dressing. Baseline: 8/10 01/24/24: at most 7/10  Goal status: IN  PROGRESS  3.  Pt will improve MMT score for R global shoulder strengh to a 4+/5 to demonstrate improvement in strength for quality of motion and activity performance.  Baseline: deferred 01/24/24: 3-/5 Goal status: IN PROGRESS  4.  Pt will improve R shoulder flexion and abduction AROM to 160d to demonstrate volitional mobility neccessary for functional overhead mobility utilized during ADLs such as cleaning and washing. Baseline: see obj chart 01/24/24: shoulder flexion 95, abd 87 in supine Goal status: IN PROGRESS  ---------------------------------------------------------------------------------------------  PLAN: PT FREQUENCY: 1-2x/week  PT DURATION: 6 weeks  PLANNED INTERVENTIONS: 97110-Therapeutic exercises, 97530- Therapeutic activity, [id number]- Neuromuscular re-education, 97535- Self Care, [id number]- Manual therapy, [id number]- Electrical stimulation (unattended), 97016- Vasopneumatic device, 97033- Ionotophoresis 4mg /ml Dexamethasone , Patient/Family education, Taping, Joint mobilization, DME instructions, Cryotherapy, and Moist heat  PLAN FOR NEXT SESSION: Increase AROM and resistance as able.    Marko Molt, PT, DPT  02/10/2024 3:58 PM

## 2024-02-14 ENCOUNTER — Ambulatory Visit: Admitting: Physical Therapy

## 2024-02-14 DIAGNOSIS — M6281 Muscle weakness (generalized): Secondary | ICD-10-CM

## 2024-02-14 DIAGNOSIS — M25511 Pain in right shoulder: Secondary | ICD-10-CM | POA: Diagnosis not present

## 2024-02-14 DIAGNOSIS — M25611 Stiffness of right shoulder, not elsewhere classified: Secondary | ICD-10-CM

## 2024-02-14 NOTE — Therapy (Signed)
 OUTPATIENT PHYSICAL THERAPY NOTE   Patient Name: Norma [name] MRN: [id number] DOB:1960-07-14, 63 y.o., female Today's Date: 02/14/2024  END OF SESSION:  PT End of Session - 02/14/24 1024     Visit Number 15    Number of Visits 22    Date for Recertification  03/06/24    Authorization Type [employer] MCR/MCD    PT Start Time 1020    PT Stop Time 1100    PT Time Calculation (min) 40 min    Activity Tolerance Patient tolerated treatment well    Behavior During Therapy WFL for tasks assessed/performed          Past Medical History:  Diagnosis Date   A-fib (HCC)    Acid reflux    Allergy    Anemia    Anxiety    Arthritis    Back pain    Depression    Diabetes mellitus without complication (HCC)    on meds   Dysrhythmia    Fatty liver    Headache    stress headaches, migraines at times   Heart murmur    Hypertension    Irregular heart beat    Left shoulder pain    Shortness of breath dyspnea    with exertion   Tachyarrhythmia    Trigger point of thoracic region 03/22/2012   Past Surgical History:  Procedure Laterality Date   ANKLE SURGERY     CHOLECYSTECTOMY N/A 03/20/2020   Procedure: LAPAROSCOPIC CHOLECYSTECTOMY WITH INTRAOPERATIVE CHOLANGIOGRAM AND WEDGE LIVER BIOPSY;  Surgeon: Gladis Cough, MD;  Location: [facility];  Service: General;  Laterality: N/A;   KNEE SURGERY Left    LUMBAR LAMINECTOMY/DECOMPRESSION MICRODISCECTOMY Left 06/06/2014   Procedure: LUMBAR LAMINECTOMY/DECOMPRESSION MICRODISCECTOMY 1 LEVEL;  Surgeon: Reyes Budge, MD;  Location: [facility];  Service: Neurosurgery;  Laterality: Left;  Left L5S1 microdiskectomy   REVERSE SHOULDER ARTHROPLASTY Right 11/03/2023   Procedure: ARTHROPLASTY, SHOULDER, TOTAL, REVERSE;  Surgeon: Dozier Soulier, MD;  Location: [facility];  Service: Orthopedics;  Laterality: Right;   ROTATOR CUFF REPAIR Bilateral 02/20/2014   TUBAL LIGATION     Patient Active Problem List   Diagnosis Date Noted   Left foot pain  05/17/2023   Elevated serum creatinine 05/17/2023   Epidermal inclusion cyst 12/06/2022   Viral syndrome 04/05/2022   Generalized abdominal cramping 11/27/2021   Tendinopathy of right rotator cuff 04/04/2020   HCV antibody positive 03/18/2020   RUQ pain 03/17/2020   Biliary colic symptom    Elevated liver enzymes    Abnormal findings on diagnostic imaging of liver and biliary tract    Abdominal pain 03/16/2020   Cholelithiasis 03/16/2020   Hypertensive urgency 03/16/2020   Type 2 diabetes mellitus (HCC) 03/16/2020   Lead exposure 11/14/2019   TMJ (temporomandibular joint disorder) 07/31/2019   Left hip pain 02/27/2019   Uterine mass 12/19/2018   Allergic rhinitis with postnasal drip 11/17/2018   Abdominal pain, right lower quadrant 06/23/2018   Diverticulosis of colon without hemorrhage 06/23/2018   Intractable migraine without aura and without status migrainosus 02/16/2018   Iron deficiency anemia 03/29/2017   Vitamin D  deficiency 03/01/2017   Lumbar herniated disc 06/06/2014   Depression    Spinal stenosis, lumbar region, with neurogenic claudication 11/21/2013   Hot flashes 06/30/2011   Eczema, dyshidrotic 06/02/2010   Leg cramps 05/06/2010   FIBROIDS, UTERUS 10/24/2009   GERD 06/09/2009   Chronic pain syndrome 10/04/2008   Hepatitis C carrier (HCC) 03/21/2007   Obesity, Class II, BMI 35-39.9  06/16/2006   HYPERTENSION, BENIGN SYSTEMIC 06/16/2006    PCP: Nicholas Bar, MD  REFERRING PROVIDER: Dozier Soulier, MD  REFERRING DIAG:  Free Text Diagnosis  S/P RIGHT TSA    Rationale for Evaluation and Treatment: Rehabilitation  THERAPY DIAG:  Right shoulder pain, unspecified chronicity  Stiffness of right shoulder, not elsewhere classified  Muscle weakness (generalized)  PERTINENT HISTORY: GERD, diverticulosis, DM2, HTN  WEIGHT BEARING RESTRICTIONS: No  FALLS:  Has patient fallen in last 6 months? No  LIVING ENVIRONMENT: Lives with: lives alone Lives  in: House/apartment Stairs: No Has following equipment at home: Single point cane  OCCUPATION: Not currently   PRECAUTIONS: Shoulder no lifting more than 5 lb, no IR behind back ---------------------------------------------------------------------------------------------  SUBJECTIVE:                                                                                                                                                                                      SUBJECTIVE STATEMENT: Reports no pain today. States she feels she's been doing pretty good.  Eval statement 12/01/2023: had R TSA on 11/03/2023 Has difficulties with lifting, bathing, and ADLs such as making the bed. Currently 0/10 pain, minimal swelling around surgical site, deep aching pain referred from surgery site to midline of arm   Hand dominance: Right  RED FLAGS: None   PLOF: Independent  PATIENT GOALS: be able to lift the shoulder  NEXT MD VISIT: n/a ---------------------------------------------------------------------------------------------  OBJECTIVE:  Note: Objective measures were completed at Evaluation unless otherwise noted.  DIAGNOSTIC FINDINGS:  N/a  PATIENT SURVEYS :  Quick dash: ~80%  COGNITION: Overall cognitive status: Within functional limits for tasks assessed     SENSATION: WFL  POSTURE: Forward head, increased kyphosis  UPPER EXTREMITY ROM:   ROM Right eval Left eval Right 01/04/24 Right 02/14/24  Shoulder flexion P: 80d WFL A: 95 sup A: 118 sit  Shoulder extension  WFL    Shoulder abduction P: 80d WFL A: 87 sup A: 102 sit  Shoulder adduction      Shoulder internal rotation P: 70d WFL    Shoulder external rotation P: 10d WFL A: 25 sitting A: 42 sit  Elbow flexion      Elbow extension      Wrist flexion      Wrist extension      Wrist ulnar deviation      Wrist radial deviation      Wrist pronation      Wrist supination      (Blank rows = not tested)  UPPER  EXTREMITY [id number] d/t pain and recent surgery  MMT Right eval Left eval  Shoulder flexion    Shoulder  extension    Shoulder abduction    Shoulder adduction    Shoulder internal rotation    Shoulder external rotation    Middle trapezius    Lower trapezius    Elbow flexion    Elbow extension    Wrist flexion    Wrist extension    Wrist ulnar deviation    Wrist radial deviation    Wrist pronation    Wrist supination    Grip strength (lbs)    (Blank rows = not tested)  SHOULDER SPECIAL TESTS: deferred  JOINT MOBILITY TESTING:  deferred  PALPATION:  Increased sensitivity around surgical site  TODAY'S TREATMENT: [facility] Adult PT Treatment:                                                DATE: 02/14/2024 Therapeutic Exercise  UBE L2; 3 min fwd, 3 min bwd  Standing shoulder flexion eccentrics 1# off wall x10 Standing shoulder scaption eccentrics 1# off wall x10 Sitting shoulder ER with arm propped at 80 deg abd 2x10 Neuromuscular Re-ed Standing wall wash circles x10 (only able to perform to ~60 deg of abd) Standing wall wash diagonal x5 Standing mid trap setting off wall x10 Standing low trap setting off wall x5 Wall push up plus 2x10 Sitting modified mid row with arm propped on counter 2x10 Manual Therapy Gentle shoulder mobs using pillow case grade II to III STM & TPR R UT and levator scap    [facility] Adult PT Treatment:                                                DATE: 02/09/2024 Therapeutic Exercise  UBE L2; 3 min fwd, 3 min bwd Pball rolling flexion at wall, 2 x 10 Pball rolling scaption at wall, 2 x 10  Standing row green TB x 20 Standing shoulder ext red TB 2x10 Standing shoulder adduction red TB 2 x 10  Standing shoulder abduction red TB 2 x 10  Seated shoulder flexion with 2# dumbbell 2 x 5 Seated shoulder scaption with 2# dumbbell 2 x 5  [facility] Adult PT Treatment:                                                DATE: 02/07/2024  Therapeutic Exercise   UBE L2; 3 min fwd, 3 min bwd Standing row green TB 2x10 Standing shoulder ext red TB 2x10 Standing shoulder adduction red TB 2 x 10  Standing UE ranger flexion x10, scaption x10 Standing shoulder abduction yellow TB 2 x 10   Neuromuscular Reed Seated shoulder flexion with 2# dumbbell 2 x 5 Seated shoulder scaption with 2# dumbbell 2 x 5   [facility] Adult PT Treatment:                                                DATE: 01/31/2024  Seated Pulley 2 min flexion, 2 min scaption  Standing row green TB 2x10 Standing  shoulder ext red TB 2x10 Standing shoulder adduction red TB 2 x 10  Standing UE ranger flexion x10, scaption x10 Isometric ER/IR walk outs RTB 2x 6 each  UBE L1; 2 min fwd, 2 min bwd    PATIENT EDUCATION: Education details: Pt received education regarding HEP performance, ADL performance, functional activity tolerance, impairment education, appropriate performance of therapeutic activities. Person educated: Patient Education method: Explanation, Demonstration, Tactile cues, Verbal cues, and Handouts Education comprehension: verbalized understanding and returned demonstration  HOME EXERCISE PROGRAM: Access Code: [email or web address] URL: [email or web address] Date: 01/27/2024 Prepared by: Cherine Drumgoole April Earnie Starring  Exercises - Standing Shoulder Row with Anchored Resistance  - 1 x daily - 7 x weekly - 2 sets - 10 reps - Shoulder extension with resistance - Neutral  - 1 x daily - 7 x weekly - 2 sets - 10 reps - Shoulder External Rotation and Scapular Retraction  - 1 x daily - 7 x weekly - 2 sets - 10 reps - Shoulder External Rotation in 45 Degrees Abduction  - 1 x daily - 7 x weekly - 2 sets - 10 reps - Standing Shoulder Abduction Slides at Wall  - 1 x daily - 7 x weekly - 1 sets - 10 reps - Standing Single Shoulder Flexion Wall Slide with Palm Up (Mirrored)  - 1 x daily - 7 x weekly - 1 sets - 10  reps ---------------------------------------------------------------------------------------------  ASSESSMENT:  CLINICAL IMPRESSION: 02/14/2024 Pt with improving AROM. Able to elevate shoulder against gravity. Continuing to work on shoulder eccentrics to improve strength at higher ranges. Continued scapular strengthening especially with arm abducted/elevated > 45 deg for improved posterior shoulder support (still gets anterior shoulder pain with higher ranges of elevation). Cues to reduce UT compensation     Eval impression (12/01/2023): Pt. attended today's physical therapy session for evaluation of R TSA. Pt had a R TSA on 11/03/2023, putting her 4 weeks out as of today 12/01/2023. Pt has notable deficits with shoulder mobility, strength, stability, and pain levels. Pt would benefit from therapeutic focus on P/AAROM and periscapular strengthening in line with surgical protocol. Protocol is written out on the 2nd page of the 11/23/2023 referral seen in the media tab on [employer].  Treatment performed today focused on pt education detailed in the objective. Pt demonstrated great understanding of education provided. required minimal v/t cues and no assistance for appropriate performance with today's activities.  Pts next protocol stage will be s/p 6 wks on 12/15/2023. Pt requires the intervention of skilled outpatient physical therapy to address the aforementioned deficits and progress towards a functional level in line with therapeutic goals.    OBJECTIVE IMPAIRMENTS: decreased mobility, decreased strength, hypomobility, impaired UE functional use, improper body mechanics, postural dysfunction, and pain.   ACTIVITY LIMITATIONS: lifting, sleeping, bed mobility, bathing, toileting, dressing, and reach over head  PARTICIPATION LIMITATIONS: cleaning, laundry, driving, shopping, and community activity  PERSONAL FACTORS: Age, Fitness, Past/current experiences, Time since onset of injury/illness/exacerbation,  and 1-2 comorbidities: DM2 are also affecting patient's functional outcome.   REHAB POTENTIAL: Good  CLINICAL DECISION MAKING: Stable/uncomplicated  EVALUATION COMPLEXITY: Low  GOALS: Goals reviewed with patient? Yes  SHORT TERM GOALS: Target date: 12/29/2023  Pt will be independent with administered HEP to demonstrate the competency necessary for long term managemnet of symptoms at home.  Baseline: Goal status: MET Pt reports adherence 12/22/23   LONG TERM GOALS: Target date: 03/06/2024   Pt. Will achieve a DASH score of  40% as to demonstrate improvement in  self-perceived functional ability with daily activities.  Baseline: ~80% 01/24/24: QuickDASH Score: 40.9 / 100 = 40.9 % Goal status: IN PROGRESS  2.  Pt will report pain levels improving during ADLs to be less than or equal to 2/10 as to demonstrate improved tolerance with daily functional activities such as bathing and dressing. Baseline: 8/10 01/24/24: at most 7/10  Goal status: IN PROGRESS  3.  Pt will improve MMT score for R global shoulder strengh to a 4+/5 to demonstrate improvement in strength for quality of motion and activity performance.  Baseline: deferred 01/24/24: 3-/5 Goal status: IN PROGRESS  4.  Pt will improve R shoulder flexion and abduction AROM to 160d to demonstrate volitional mobility neccessary for functional overhead mobility utilized during ADLs such as cleaning and washing. Baseline: see obj chart 01/24/24: shoulder flexion 95, abd 87 in supine Goal status: IN PROGRESS  ---------------------------------------------------------------------------------------------  PLAN: PT FREQUENCY: 1-2x/week  PT DURATION: 6 weeks  PLANNED INTERVENTIONS: 97110-Therapeutic exercises, 97530- Therapeutic activity, [id number]- Neuromuscular re-education, 97535- Self Care, [id number]- Manual therapy, G0283- Electrical stimulation (unattended), 97016- Vasopneumatic device, [id number]- Ionotophoresis 4mg /ml Dexamethasone ,  Patient/Family education, Taping, Joint mobilization, DME instructions, Cryotherapy, and Moist heat  PLAN FOR NEXT SESSION: Increase AROM and resistance as able.    Jahari Wiginton April Ma L Kali Ambler, PT, DPT  02/14/2024 10:24 AM

## 2024-02-15 ENCOUNTER — Other Ambulatory Visit: Payer: Self-pay | Admitting: Family Medicine

## 2024-02-15 DIAGNOSIS — M545 Low back pain, unspecified: Secondary | ICD-10-CM

## 2024-02-16 ENCOUNTER — Ambulatory Visit

## 2024-02-16 NOTE — Therapy (Incomplete)
 OUTPATIENT PHYSICAL THERAPY NOTE   Patient Name: Norma [name] MRN: [id number] DOB:[birth year], 63 y.o., female Today's Date: 02/16/2024  END OF SESSION:    Past Medical History:  Diagnosis Date   A-fib (HCC)    Acid reflux    Allergy    Anemia    Anxiety    Arthritis    Back pain    Depression    Diabetes mellitus without complication (HCC)    on meds   Dysrhythmia    Fatty liver    Headache    stress headaches, migraines at times   Heart murmur    Hypertension    Irregular heart beat    Left shoulder pain    Shortness of breath dyspnea    with exertion   Tachyarrhythmia    Trigger point of thoracic region 03/22/2012   Past Surgical History:  Procedure Laterality Date   ANKLE SURGERY     CHOLECYSTECTOMY N/A 03/20/2020   Procedure: LAPAROSCOPIC CHOLECYSTECTOMY WITH INTRAOPERATIVE CHOLANGIOGRAM AND WEDGE LIVER BIOPSY;  Surgeon: Gladis Cough, MD;  Location: [facility];  Service: General;  Laterality: N/A;   KNEE SURGERY Left    LUMBAR LAMINECTOMY/DECOMPRESSION MICRODISCECTOMY Left 06/06/2014   Procedure: LUMBAR LAMINECTOMY/DECOMPRESSION MICRODISCECTOMY 1 LEVEL;  Surgeon: Reyes Budge, MD;  Location: [facility];  Service: Neurosurgery;  Laterality: Left;  Left L5S1 microdiskectomy   REVERSE SHOULDER ARTHROPLASTY Right 11/03/2023   Procedure: ARTHROPLASTY, SHOULDER, TOTAL, REVERSE;  Surgeon: Dozier Soulier, MD;  Location: [facility];  Service: Orthopedics;  Laterality: Right;   ROTATOR CUFF REPAIR Bilateral 02/20/2014   TUBAL LIGATION     Patient Active Problem List   Diagnosis Date Noted   Left foot pain 05/17/2023   Elevated serum creatinine 05/17/2023   Epidermal inclusion cyst 12/06/2022   Viral syndrome 04/05/2022   Generalized abdominal cramping 11/27/2021   Tendinopathy of right rotator cuff 04/04/2020   HCV antibody positive 03/18/2020   RUQ pain 03/17/2020   Biliary colic symptom    Elevated liver enzymes    Abnormal findings on diagnostic imaging  of liver and biliary tract    Abdominal pain 03/16/2020   Cholelithiasis 03/16/2020   Hypertensive urgency 03/16/2020   Type 2 diabetes mellitus (HCC) 03/16/2020   Lead exposure 11/14/2019   TMJ (temporomandibular joint disorder) 07/31/2019   Left hip pain 02/27/2019   Uterine mass 12/19/2018   Allergic rhinitis with postnasal drip 11/17/2018   Abdominal pain, right lower quadrant 06/23/2018   Diverticulosis of colon without hemorrhage 06/23/2018   Intractable migraine without aura and without status migrainosus 02/16/2018   Iron deficiency anemia 03/29/2017   Vitamin D  deficiency 03/01/2017   Lumbar herniated disc 06/06/2014   Depression    Spinal stenosis, lumbar region, with neurogenic claudication 11/21/2013   Hot flashes 06/30/2011   Eczema, dyshidrotic 06/02/2010   Leg cramps 05/06/2010   FIBROIDS, UTERUS 10/24/2009   GERD 06/09/2009   Chronic pain syndrome 10/04/2008   Hepatitis C carrier (HCC) 03/21/2007   Obesity, Class II, BMI 35-39.9 06/16/2006   HYPERTENSION, BENIGN SYSTEMIC 06/16/2006    PCP: Nicholas Bar, MD  REFERRING PROVIDER: Dozier Soulier, MD  REFERRING DIAG:  Free Text Diagnosis  S/P RIGHT TSA    Rationale for Evaluation and Treatment: Rehabilitation  THERAPY DIAG:  No diagnosis found.  PERTINENT HISTORY: GERD, diverticulosis, DM2, HTN  WEIGHT BEARING RESTRICTIONS: No  FALLS:  Has patient fallen in last 6 months? No  LIVING ENVIRONMENT: Lives with: lives alone Lives in: House/apartment Stairs: No Has following equipment  at home: Single point cane  OCCUPATION: Not currently   PRECAUTIONS: Shoulder no lifting more than 5 lb, no IR behind back ---------------------------------------------------------------------------------------------  SUBJECTIVE:                                                                                                                                                                                       SUBJECTIVE STATEMENT: ***  Reports no pain today. States she feels she's been doing pretty good.  Eval statement 12/01/2023: had R TSA on 11/03/2023 Has difficulties with lifting, bathing, and ADLs such as making the bed. Currently 0/10 pain, minimal swelling around surgical site, deep aching pain referred from surgery site to midline of arm   Hand dominance: Right  RED FLAGS: None   PLOF: Independent  PATIENT GOALS: be able to lift the shoulder  NEXT MD VISIT: n/a ---------------------------------------------------------------------------------------------  OBJECTIVE:  Note: Objective measures were completed at Evaluation unless otherwise noted.  DIAGNOSTIC FINDINGS:  N/a  PATIENT SURVEYS :  Quick dash: ~80%  COGNITION: Overall cognitive status: Within functional limits for tasks assessed     SENSATION: WFL  POSTURE: Forward head, increased kyphosis  UPPER EXTREMITY ROM:   ROM Right eval Left eval Right 01/04/24 Right 02/14/24  Shoulder flexion P: 80d WFL A: 95 sup A: 118 sit  Shoulder extension  WFL    Shoulder abduction P: 80d WFL A: 87 sup A: 102 sit  Shoulder adduction      Shoulder internal rotation P: 70d WFL    Shoulder external rotation P: 10d WFL A: 25 sitting A: 42 sit  Elbow flexion      Elbow extension      Wrist flexion      Wrist extension      Wrist ulnar deviation      Wrist radial deviation      Wrist pronation      Wrist supination      (Blank rows = not tested)  UPPER EXTREMITY FFU:[id number] d/t pain and recent surgery  MMT Right eval Left eval  Shoulder flexion    Shoulder extension    Shoulder abduction    Shoulder adduction    Shoulder internal rotation    Shoulder external rotation    Middle trapezius    Lower trapezius    Elbow flexion    Elbow extension    Wrist flexion    Wrist extension    Wrist ulnar deviation    Wrist radial deviation    Wrist pronation    Wrist supination    Grip strength (lbs)     (Blank rows = not tested)  SHOULDER SPECIAL TESTS: deferred  JOINT MOBILITY  TESTING:  deferred  PALPATION:  Increased sensitivity around surgical site  TODAY'S TREATMENT: [facility] Adult PT Treatment:                                                DATE: 02/16/24 Therapeutic Exercise: UBE L2; 3 min fwd, 3 min bwd  Standing shoulder flexion eccentrics 1# off wall x10 Standing shoulder scaption eccentrics 1# off wall x10 Sitting shoulder ER with arm propped at 80 deg abd 2x10 Neuromuscular Re-ed Standing wall wash circles x10 (only able to perform to ~60 deg of abd) Standing wall wash diagonal x5 Standing mid trap setting off wall x10 Standing low trap setting off wall x5 Wall push up plus 2x10 Sitting modified mid row with arm propped on counter 2x10 Manual Therapy STM & TPR R UT and levator scap   [facility] Adult PT Treatment:                                                DATE: 02/14/2024 Therapeutic Exercise  UBE L2; 3 min fwd, 3 min bwd  Standing shoulder flexion eccentrics 1# off wall x10 Standing shoulder scaption eccentrics 1# off wall x10 Sitting shoulder ER with arm propped at 80 deg abd 2x10 Neuromuscular Re-ed Standing wall wash circles x10 (only able to perform to ~60 deg of abd) Standing wall wash diagonal x5 Standing mid trap setting off wall x10 Standing low trap setting off wall x5 Wall push up plus 2x10 Sitting modified mid row with arm propped on counter 2x10 Manual Therapy Gentle shoulder mobs using pillow case grade II to III STM & TPR R UT and levator scap    [facility] Adult PT Treatment:                                                DATE: 02/09/2024 Therapeutic Exercise  UBE L2; 3 min fwd, 3 min bwd Pball rolling flexion at wall, 2 x 10 Pball rolling scaption at wall, 2 x 10  Standing row green TB x 20 Standing shoulder ext red TB 2x10 Standing shoulder adduction red TB 2 x 10  Standing shoulder abduction red TB 2 x 10  Seated shoulder flexion with 2#  dumbbell 2 x 5 Seated shoulder scaption with 2# dumbbell 2 x 5   PATIENT EDUCATION: Education details: Pt received education regarding HEP performance, ADL performance, functional activity tolerance, impairment education, appropriate performance of therapeutic activities. Person educated: Patient Education method: Explanation, Demonstration, Tactile cues, Verbal cues, and Handouts Education comprehension: verbalized understanding and returned demonstration  HOME EXERCISE PROGRAM: Access Code: [email or web address] URL: [email or web address] Date: 01/27/2024 Prepared by: Gellen April Earnie Starring  Exercises - Standing Shoulder Row with Anchored Resistance  - 1 x daily - 7 x weekly - 2 sets - 10 reps - Shoulder extension with resistance - Neutral  - 1 x daily - 7 x weekly - 2 sets - 10 reps - Shoulder External Rotation and Scapular Retraction  - 1 x daily - 7 x weekly - 2 sets - 10 reps - Shoulder External Rotation in 45 Degrees Abduction  -  1 x daily - 7 x weekly - 2 sets - 10 reps - Standing Shoulder Abduction Slides at Wall  - 1 x daily - 7 x weekly - 1 sets - 10 reps - Standing Single Shoulder Flexion Wall Slide with Palm Up (Mirrored)  - 1 x daily - 7 x weekly - 1 sets - 10 reps ---------------------------------------------------------------------------------------------  ASSESSMENT:  CLINICAL IMPRESSION: ***  02/16/2024 Pt with improving AROM. Able to elevate shoulder against gravity. Continuing to work on shoulder eccentrics to improve strength at higher ranges. Continued scapular strengthening especially with arm abducted/elevated > 45 deg for improved posterior shoulder support (still gets anterior shoulder pain with higher ranges of elevation). Cues to reduce UT compensation   Eval impression (12/01/2023): Pt. attended today's physical therapy session for evaluation of R TSA. Pt had a R TSA on 11/03/2023, putting her 4 weeks out as of today 12/01/2023. Pt has notable deficits  with shoulder mobility, strength, stability, and pain levels. Pt would benefit from therapeutic focus on P/AAROM and periscapular strengthening in line with surgical protocol. Protocol is written out on the 2nd page of the 11/23/2023 referral seen in the media tab on [employer].  Treatment performed today focused on pt education detailed in the objective. Pt demonstrated great understanding of education provided. required minimal v/t cues and no assistance for appropriate performance with today's activities.  Pts next protocol stage will be s/p 6 wks on 12/15/2023. Pt requires the intervention of skilled outpatient physical therapy to address the aforementioned deficits and progress towards a functional level in line with therapeutic goals.    OBJECTIVE IMPAIRMENTS: decreased mobility, decreased strength, hypomobility, impaired UE functional use, improper body mechanics, postural dysfunction, and pain.   ACTIVITY LIMITATIONS: lifting, sleeping, bed mobility, bathing, toileting, dressing, and reach over head  PARTICIPATION LIMITATIONS: cleaning, laundry, driving, shopping, and community activity  PERSONAL FACTORS: Age, Fitness, Past/current experiences, Time since onset of injury/illness/exacerbation, and 1-2 comorbidities: DM2 are also affecting patient's functional outcome.   REHAB POTENTIAL: Good  CLINICAL DECISION MAKING: Stable/uncomplicated  EVALUATION COMPLEXITY: Low  GOALS: Goals reviewed with patient? Yes  SHORT TERM GOALS: Target date: 12/29/2023  Pt will be independent with administered HEP to demonstrate the competency necessary for long term managemnet of symptoms at home.  Baseline: Goal status: MET Pt reports adherence 12/22/23   LONG TERM GOALS: Target date: 03/06/2024   Pt. Will achieve a DASH score of  40% as to demonstrate improvement in self-perceived functional ability with daily activities.  Baseline: ~80% 01/24/24: QuickDASH Score: 40.9 / 100 = 40.9 % Goal status: IN  PROGRESS  2.  Pt will report pain levels improving during ADLs to be less than or equal to 2/10 as to demonstrate improved tolerance with daily functional activities such as bathing and dressing. Baseline: 8/10 01/24/24: at most 7/10  Goal status: IN PROGRESS  3.  Pt will improve MMT score for R global shoulder strengh to a 4+/5 to demonstrate improvement in strength for quality of motion and activity performance.  Baseline: deferred 01/24/24: 3-/5 Goal status: IN PROGRESS  4.  Pt will improve R shoulder flexion and abduction AROM to 160d to demonstrate volitional mobility neccessary for functional overhead mobility utilized during ADLs such as cleaning and washing. Baseline: see obj chart 01/24/24: shoulder flexion 95, abd 87 in supine Goal status: IN PROGRESS  ---------------------------------------------------------------------------------------------  PLAN: PT FREQUENCY: 1-2x/week  PT DURATION: 6 weeks  PLANNED INTERVENTIONS: 97110-Therapeutic exercises, 97530- Therapeutic activity, [id number]- Neuromuscular re-education, 97535- Self Care, [id number]- Manual  therapy, G0283- Electrical stimulation (unattended), 97016- Vasopneumatic device, [id number]- Ionotophoresis 4mg /ml Dexamethasone , Patient/Family education, Taping, Joint mobilization, DME instructions, Cryotherapy, and Moist heat  PLAN FOR NEXT SESSION: Increase AROM and resistance as able.    Corean Pouch, PTA 02/16/2024 8:35 AM

## 2024-02-21 ENCOUNTER — Ambulatory Visit: Attending: Orthopedic Surgery

## 2024-02-21 DIAGNOSIS — M25511 Pain in right shoulder: Secondary | ICD-10-CM | POA: Diagnosis present

## 2024-02-21 DIAGNOSIS — M25611 Stiffness of right shoulder, not elsewhere classified: Secondary | ICD-10-CM | POA: Diagnosis present

## 2024-02-21 DIAGNOSIS — M6281 Muscle weakness (generalized): Secondary | ICD-10-CM | POA: Insufficient documentation

## 2024-02-21 NOTE — Therapy (Signed)
 OUTPATIENT PHYSICAL THERAPY NOTE   Patient Name: Norma [name] MRN: [id number] DOB:[birth year], 63 y.o., female Today's Date: 02/21/2024  END OF SESSION:  PT End of Session - 02/21/24 1112     Visit Number 16    Number of Visits 22    Date for Recertification  03/06/24    PT Start Time 1115    PT Stop Time 1155    PT Time Calculation (min) 40 min    Activity Tolerance Patient tolerated treatment well    Behavior During Therapy WFL for tasks assessed/performed           Past Medical History:  Diagnosis Date   A-fib (HCC)    Acid reflux    Allergy    Anemia    Anxiety    Arthritis    Back pain    Depression    Diabetes mellitus without complication (HCC)    on meds   Dysrhythmia    Fatty liver    Headache    stress headaches, migraines at times   Heart murmur    Hypertension    Irregular heart beat    Left shoulder pain    Shortness of breath dyspnea    with exertion   Tachyarrhythmia    Trigger point of thoracic region 03/22/2012   Past Surgical History:  Procedure Laterality Date   ANKLE SURGERY     CHOLECYSTECTOMY N/A 03/20/2020   Procedure: LAPAROSCOPIC CHOLECYSTECTOMY WITH INTRAOPERATIVE CHOLANGIOGRAM AND WEDGE LIVER BIOPSY;  Surgeon: Gladis Cough, MD;  Location: [facility];  Service: General;  Laterality: N/A;   KNEE SURGERY Left    LUMBAR LAMINECTOMY/DECOMPRESSION MICRODISCECTOMY Left 06/06/2014   Procedure: LUMBAR LAMINECTOMY/DECOMPRESSION MICRODISCECTOMY 1 LEVEL;  Surgeon: Reyes Budge, MD;  Location: [facility];  Service: Neurosurgery;  Laterality: Left;  Left L5S1 microdiskectomy   REVERSE SHOULDER ARTHROPLASTY Right 11/03/2023   Procedure: ARTHROPLASTY, SHOULDER, TOTAL, REVERSE;  Surgeon: Dozier Soulier, MD;  Location: [facility];  Service: Orthopedics;  Laterality: Right;   ROTATOR CUFF REPAIR Bilateral 02/20/2014   TUBAL LIGATION     Patient Active Problem List   Diagnosis Date Noted   Left foot pain 05/17/2023   Elevated serum creatinine  05/17/2023   Epidermal inclusion cyst 12/06/2022   Viral syndrome 04/05/2022   Generalized abdominal cramping 11/27/2021   Tendinopathy of right rotator cuff 04/04/2020   HCV antibody positive 03/18/2020   RUQ pain 03/17/2020   Biliary colic symptom    Elevated liver enzymes    Abnormal findings on diagnostic imaging of liver and biliary tract    Abdominal pain 03/16/2020   Cholelithiasis 03/16/2020   Hypertensive urgency 03/16/2020   Type 2 diabetes mellitus (HCC) 03/16/2020   Lead exposure 11/14/2019   TMJ (temporomandibular joint disorder) 07/31/2019   Left hip pain 02/27/2019   Uterine mass 12/19/2018   Allergic rhinitis with postnasal drip 11/17/2018   Abdominal pain, right lower quadrant 06/23/2018   Diverticulosis of colon without hemorrhage 06/23/2018   Intractable migraine without aura and without status migrainosus 02/16/2018   Iron deficiency anemia 03/29/2017   Vitamin D  deficiency 03/01/2017   Lumbar herniated disc 06/06/2014   Depression    Spinal stenosis, lumbar region, with neurogenic claudication 11/21/2013   Hot flashes 06/30/2011   Eczema, dyshidrotic 06/02/2010   Leg cramps 05/06/2010   FIBROIDS, UTERUS 10/24/2009   GERD 06/09/2009   Chronic pain syndrome 10/04/2008   Hepatitis C carrier (HCC) 03/21/2007   Obesity, Class II, BMI 35-39.9 06/16/2006   HYPERTENSION, BENIGN SYSTEMIC  06/16/2006    PCP: Nicholas Bar, MD  REFERRING PROVIDER: Dozier Soulier, MD  REFERRING DIAG:  Free Text Diagnosis  S/P RIGHT TSA    Rationale for Evaluation and Treatment: Rehabilitation  THERAPY DIAG:  Right shoulder pain, unspecified chronicity  Stiffness of right shoulder, not elsewhere classified  Muscle weakness (generalized)  PERTINENT HISTORY: GERD, diverticulosis, DM2, HTN  WEIGHT BEARING RESTRICTIONS: No  FALLS:  Has patient fallen in last 6 months? No  LIVING ENVIRONMENT: Lives with: lives alone Lives in: House/apartment Stairs: No Has  following equipment at home: Single point cane  OCCUPATION: Not currently   PRECAUTIONS: Shoulder no lifting more than 5 lb, no IR behind back ---------------------------------------------------------------------------------------------  SUBJECTIVE:                                                                                                                                                                                      SUBJECTIVE STATEMENT: Patient reports no pain and has been compliant with her HEP.  Eval statement 12/01/2023: had R TSA on 11/03/2023 Has difficulties with lifting, bathing, and ADLs such as making the bed. Currently 0/10 pain, minimal swelling around surgical site, deep aching pain referred from surgery site to midline of arm   Hand dominance: Right  RED FLAGS: None   PLOF: Independent  PATIENT GOALS: be able to lift the shoulder  NEXT MD VISIT: n/a ---------------------------------------------------------------------------------------------  OBJECTIVE:  Note: Objective measures were completed at Evaluation unless otherwise noted.  DIAGNOSTIC FINDINGS:  N/a  PATIENT SURVEYS :  Quick dash: ~80%  COGNITION: Overall cognitive status: Within functional limits for tasks assessed     SENSATION: WFL  POSTURE: Forward head, increased kyphosis  UPPER EXTREMITY ROM:   ROM Right eval Left eval Right 01/04/24 Right 02/14/24  Shoulder flexion P: 80d WFL A: 95 sup A: 118 sit  Shoulder extension  WFL    Shoulder abduction P: 80d WFL A: 87 sup A: 102 sit  Shoulder adduction      Shoulder internal rotation P: 70d WFL    Shoulder external rotation P: 10d WFL A: 25 sitting A: 42 sit  Elbow flexion      Elbow extension      Wrist flexion      Wrist extension      Wrist ulnar deviation      Wrist radial deviation      Wrist pronation      Wrist supination      (Blank rows = not tested)  UPPER EXTREMITY FFU:[id number] d/t pain and recent  surgery  MMT Right eval Left eval  Shoulder flexion    Shoulder extension    Shoulder abduction  Shoulder adduction    Shoulder internal rotation    Shoulder external rotation    Middle trapezius    Lower trapezius    Elbow flexion    Elbow extension    Wrist flexion    Wrist extension    Wrist ulnar deviation    Wrist radial deviation    Wrist pronation    Wrist supination    Grip strength (lbs)    (Blank rows = not tested)  SHOULDER SPECIAL TESTS: deferred  JOINT MOBILITY TESTING:  deferred  PALPATION:  Increased sensitivity around surgical site  TODAY'S TREATMENT: [facility] Adult PT Treatment:                                                DATE: 02/21/24 Therapeutic Exercise: Standing shoulder flexion #1 back against wall 2x10 Sitting shoulder ER with arm propped at 80 deg abd 2x10 Dowel IR stretch 10 hold x10 Seated ITYs x10 ea Neuromuscular Re-ed Wall ball circles 500g CW/CCW x30 ea Wall walks YTB around hands up/down/lat to fatigue Manual Therapy STM & TPR R UT and levator scap x8 mins pt positioned in supine   [facility] Adult PT Treatment:                                                DATE: 02/14/2024 Therapeutic Exercise  UBE L2; 3 min fwd, 3 min bwd  Standing shoulder flexion eccentrics 1# off wall x10 Standing shoulder scaption eccentrics 1# off wall x10 Sitting shoulder ER with arm propped at 80 deg abd 2x10 Neuromuscular Re-ed Standing wall wash circles x10 (only able to perform to ~60 deg of abd) Standing wall wash diagonal x5 Standing mid trap setting off wall x10 Standing low trap setting off wall x5 Wall push up plus 2x10 Sitting modified mid row with arm propped on counter 2x10 Manual Therapy Gentle shoulder mobs using pillow case grade II to III STM & TPR R UT and levator scap    [facility] Adult PT Treatment:                                                DATE: 02/09/2024 Therapeutic Exercise  UBE L2; 3 min fwd, 3 min bwd Pball rolling  flexion at wall, 2 x 10 Pball rolling scaption at wall, 2 x 10  Standing row green TB x 20 Standing shoulder ext red TB 2x10 Standing shoulder adduction red TB 2 x 10  Standing shoulder abduction red TB 2 x 10  Seated shoulder flexion with 2# dumbbell 2 x 5 Seated shoulder scaption with 2# dumbbell 2 x 5   PATIENT EDUCATION: Education details: Pt received education regarding HEP performance, ADL performance, functional activity tolerance, impairment education, appropriate performance of therapeutic activities. Person educated: Patient Education method: Explanation, Demonstration, Tactile cues, Verbal cues, and Handouts Education comprehension: verbalized understanding and returned demonstration  HOME EXERCISE PROGRAM: Access Code: [email or web address] URL: [email or web address] Date: 01/27/2024 Prepared by: Gellen April Earnie Starring  Exercises - Standing Shoulder Row with Anchored Resistance  - 1 x daily - 7 x weekly - 2 sets - 10  reps - Shoulder extension with resistance - Neutral  - 1 x daily - 7 x weekly - 2 sets - 10 reps - Shoulder External Rotation and Scapular Retraction  - 1 x daily - 7 x weekly - 2 sets - 10 reps - Shoulder External Rotation in 45 Degrees Abduction  - 1 x daily - 7 x weekly - 2 sets - 10 reps - Standing Shoulder Abduction Slides at Wall  - 1 x daily - 7 x weekly - 1 sets - 10 reps - Standing Single Shoulder Flexion Wall Slide with Palm Up (Mirrored)  - 1 x daily - 7 x weekly - 1 sets - 10 reps ---------------------------------------------------------------------------------------------  ASSESSMENT:  CLINICAL IMPRESSION: Patient presents to PT reporting no increase in pain. Patient reports having trouble with internal rotation of the right shoulder. Patient completed all exercises given, muscle fatigue present after strengthening exercises. Manual techniques performed with patient reporting therapeutic benefit to decrease muscle tension. Patient will  benefit from skilled PT to increase AROM and functional abilities.  Eval impression (12/01/2023): Pt. attended today's physical therapy session for evaluation of R TSA. Pt had a R TSA on 11/03/2023, putting her 4 weeks out as of today 12/01/2023. Pt has notable deficits with shoulder mobility, strength, stability, and pain levels. Pt would benefit from therapeutic focus on P/AAROM and periscapular strengthening in line with surgical protocol. Protocol is written out on the 2nd page of the 11/23/2023 referral seen in the media tab on [employer].  Treatment performed today focused on pt education detailed in the objective. Pt demonstrated great understanding of education provided. required minimal v/t cues and no assistance for appropriate performance with today's activities.  Pts next protocol stage will be s/p 6 wks on 12/15/2023. Pt requires the intervention of skilled outpatient physical therapy to address the aforementioned deficits and progress towards a functional level in line with therapeutic goals.    OBJECTIVE IMPAIRMENTS: decreased mobility, decreased strength, hypomobility, impaired UE functional use, improper body mechanics, postural dysfunction, and pain.   ACTIVITY LIMITATIONS: lifting, sleeping, bed mobility, bathing, toileting, dressing, and reach over head  PARTICIPATION LIMITATIONS: cleaning, laundry, driving, shopping, and community activity  PERSONAL FACTORS: Age, Fitness, Past/current experiences, Time since onset of injury/illness/exacerbation, and 1-2 comorbidities: DM2 are also affecting patient's functional outcome.   REHAB POTENTIAL: Good  CLINICAL DECISION MAKING: Stable/uncomplicated  EVALUATION COMPLEXITY: Low  GOALS: Goals reviewed with patient? Yes  SHORT TERM GOALS: Target date: 12/29/2023  Pt will be independent with administered HEP to demonstrate the competency necessary for long term managemnet of symptoms at home.  Baseline: Goal status: MET Pt reports adherence  12/22/23   LONG TERM GOALS: Target date: 03/06/2024   Pt. Will achieve a DASH score of  40% as to demonstrate improvement in self-perceived functional ability with daily activities.  Baseline: ~80% 01/24/24: QuickDASH Score: 40.9 / 100 = 40.9 % Goal status: IN PROGRESS  2.  Pt will report pain levels improving during ADLs to be less than or equal to 2/10 as to demonstrate improved tolerance with daily functional activities such as bathing and dressing. Baseline: 8/10 01/24/24: at most 7/10  Goal status: IN PROGRESS  3.  Pt will improve MMT score for R global shoulder strengh to a 4+/5 to demonstrate improvement in strength for quality of motion and activity performance.  Baseline: deferred 01/24/24: 3-/5 Goal status: IN PROGRESS  4.  Pt will improve R shoulder flexion and abduction AROM to 160d to demonstrate volitional mobility neccessary for functional  overhead mobility utilized during ADLs such as cleaning and washing. Baseline: see obj chart 01/24/24: shoulder flexion 95, abd 87 in supine Goal status: IN PROGRESS  ---------------------------------------------------------------------------------------------  PLAN: PT FREQUENCY: 1-2x/week  PT DURATION: 6 weeks  PLANNED INTERVENTIONS: 97110-Therapeutic exercises, 97530- Therapeutic activity, [id number]- Neuromuscular re-education, 97535- Self Care, [id number]- Manual therapy, [id number]- Electrical stimulation (unattended), 97016- Vasopneumatic device, 97033- Ionotophoresis 4mg /ml Dexamethasone , Patient/Family education, Taping, Joint mobilization, DME instructions, Cryotherapy, and Moist heat  PLAN FOR NEXT SESSION: Increase AROM and resistance as able.    Omir Cooprider 02/21/2024 11:55 AM

## 2024-02-23 ENCOUNTER — Other Ambulatory Visit: Payer: Self-pay | Admitting: Family Medicine

## 2024-02-23 ENCOUNTER — Ambulatory Visit

## 2024-02-23 DIAGNOSIS — M25611 Stiffness of right shoulder, not elsewhere classified: Secondary | ICD-10-CM

## 2024-02-23 DIAGNOSIS — M6281 Muscle weakness (generalized): Secondary | ICD-10-CM

## 2024-02-23 DIAGNOSIS — M25511 Pain in right shoulder: Secondary | ICD-10-CM

## 2024-02-23 DIAGNOSIS — E119 Type 2 diabetes mellitus without complications: Secondary | ICD-10-CM

## 2024-02-23 NOTE — Therapy (Signed)
 OUTPATIENT PHYSICAL THERAPY NOTE   Patient Name: Norma [name] MRN: [id number] DOB:Feb 11, 1961, 63 y.o., female Today's Date: 02/23/2024  END OF SESSION:  PT End of Session - 02/23/24 1059     Visit Number 17    Number of Visits 22    Date for Recertification  03/06/24    Authorization Type [employer] MCR/MCD    PT Start Time 1107    PT Stop Time 1147    PT Time Calculation (min) 40 min    Activity Tolerance Patient tolerated treatment well    Behavior During Therapy WFL for tasks assessed/performed            Past Medical History:  Diagnosis Date   A-fib (HCC)    Acid reflux    Allergy    Anemia    Anxiety    Arthritis    Back pain    Depression    Diabetes mellitus without complication (HCC)    on meds   Dysrhythmia    Fatty liver    Headache    stress headaches, migraines at times   Heart murmur    Hypertension    Irregular heart beat    Left shoulder pain    Shortness of breath dyspnea    with exertion   Tachyarrhythmia    Trigger point of thoracic region 03/22/2012   Past Surgical History:  Procedure Laterality Date   ANKLE SURGERY     CHOLECYSTECTOMY N/A 03/20/2020   Procedure: LAPAROSCOPIC CHOLECYSTECTOMY WITH INTRAOPERATIVE CHOLANGIOGRAM AND WEDGE LIVER BIOPSY;  Surgeon: Gladis Cough, MD;  Location: [facility];  Service: General;  Laterality: N/A;   KNEE SURGERY Left    LUMBAR LAMINECTOMY/DECOMPRESSION MICRODISCECTOMY Left 06/06/2014   Procedure: LUMBAR LAMINECTOMY/DECOMPRESSION MICRODISCECTOMY 1 LEVEL;  Surgeon: Reyes Budge, MD;  Location: [facility];  Service: Neurosurgery;  Laterality: Left;  Left L5S1 microdiskectomy   REVERSE SHOULDER ARTHROPLASTY Right 11/03/2023   Procedure: ARTHROPLASTY, SHOULDER, TOTAL, REVERSE;  Surgeon: Dozier Soulier, MD;  Location: [facility];  Service: Orthopedics;  Laterality: Right;   ROTATOR CUFF REPAIR Bilateral 02/20/2014   TUBAL LIGATION     Patient Active Problem List   Diagnosis Date Noted   Left foot pain  05/17/2023   Elevated serum creatinine 05/17/2023   Epidermal inclusion cyst 12/06/2022   Viral syndrome 04/05/2022   Generalized abdominal cramping 11/27/2021   Tendinopathy of right rotator cuff 04/04/2020   HCV antibody positive 03/18/2020   RUQ pain 03/17/2020   Biliary colic symptom    Elevated liver enzymes    Abnormal findings on diagnostic imaging of liver and biliary tract    Abdominal pain 03/16/2020   Cholelithiasis 03/16/2020   Hypertensive urgency 03/16/2020   Type 2 diabetes mellitus (HCC) 03/16/2020   Lead exposure 11/14/2019   TMJ (temporomandibular joint disorder) 07/31/2019   Left hip pain 02/27/2019   Uterine mass 12/19/2018   Allergic rhinitis with postnasal drip 11/17/2018   Abdominal pain, right lower quadrant 06/23/2018   Diverticulosis of colon without hemorrhage 06/23/2018   Intractable migraine without aura and without status migrainosus 02/16/2018   Iron deficiency anemia 03/29/2017   Vitamin D  deficiency 03/01/2017   Lumbar herniated disc 06/06/2014   Depression    Spinal stenosis, lumbar region, with neurogenic claudication 11/21/2013   Hot flashes 06/30/2011   Eczema, dyshidrotic 06/02/2010   Leg cramps 05/06/2010   FIBROIDS, UTERUS 10/24/2009   GERD 06/09/2009   Chronic pain syndrome 10/04/2008   Hepatitis C carrier (HCC) 03/21/2007   Obesity, Class II,  BMI 35-39.9 06/16/2006   HYPERTENSION, BENIGN SYSTEMIC 06/16/2006    PCP: Nicholas Bar, MD  REFERRING PROVIDER: Dozier Soulier, MD  REFERRING DIAG:  Free Text Diagnosis  S/P RIGHT TSA    Rationale for Evaluation and Treatment: Rehabilitation  THERAPY DIAG:  Right shoulder pain, unspecified chronicity  Stiffness of right shoulder, not elsewhere classified  Muscle weakness (generalized)  PERTINENT HISTORY: GERD, diverticulosis, DM2, HTN  WEIGHT BEARING RESTRICTIONS: No  FALLS:  Has patient fallen in last 6 months? No  LIVING ENVIRONMENT: Lives with: lives alone Lives  in: House/apartment Stairs: No Has following equipment at home: Single point cane  OCCUPATION: Not currently   PRECAUTIONS: Shoulder no lifting more than 5 lb, no IR behind back ---------------------------------------------------------------------------------------------  SUBJECTIVE:                                                                                                                                                                                      SUBJECTIVE STATEMENT: Patient reports having no pain but some soreness from last session, some pain down the arm last night, resolved today.  Patient reports no pain and has been compliant with her HEP.  Eval statement 12/01/2023: had R TSA on 11/03/2023 Has difficulties with lifting, bathing, and ADLs such as making the bed. Currently 0/10 pain, minimal swelling around surgical site, deep aching pain referred from surgery site to midline of arm   Hand dominance: Right  RED FLAGS: None   PLOF: Independent  PATIENT GOALS: be able to lift the shoulder  NEXT MD VISIT: n/a ---------------------------------------------------------------------------------------------  OBJECTIVE:  Note: Objective measures were completed at Evaluation unless otherwise noted.  DIAGNOSTIC FINDINGS:  N/a  PATIENT SURVEYS :  Quick dash: ~80%  COGNITION: Overall cognitive status: Within functional limits for tasks assessed     SENSATION: WFL  POSTURE: Forward head, increased kyphosis  UPPER EXTREMITY ROM:   ROM Right eval Left eval Right 01/04/24 Right 02/14/24  Shoulder flexion P: 80d WFL A: 95 sup A: 118 sit  Shoulder extension  WFL    Shoulder abduction P: 80d WFL A: 87 sup A: 102 sit  Shoulder adduction      Shoulder internal rotation P: 70d WFL    Shoulder external rotation P: 10d WFL A: 25 sitting A: 42 sit  Elbow flexion      Elbow extension      Wrist flexion      Wrist extension      Wrist ulnar deviation       Wrist radial deviation      Wrist pronation      Wrist supination      (Blank rows = not tested)  UPPER EXTREMITY FFU:[id number] d/t pain and recent surgery  MMT Right eval Left eval  Shoulder flexion    Shoulder extension    Shoulder abduction    Shoulder adduction    Shoulder internal rotation    Shoulder external rotation    Middle trapezius    Lower trapezius    Elbow flexion    Elbow extension    Wrist flexion    Wrist extension    Wrist ulnar deviation    Wrist radial deviation    Wrist pronation    Wrist supination    Grip strength (lbs)    (Blank rows = not tested)  SHOULDER SPECIAL TESTS: deferred  JOINT MOBILITY TESTING:  deferred  PALPATION:  Increased sensitivity around surgical site  TODAY'S TREATMENT: [facility] Adult PT Treatment:                                                DATE: 02/23/24 Therapeutic Exercise: Standing shoulder flexion #1 back against wall 2x10 Dowel IR stretch 10 hold x10 Seated ITYs x10 ea Neuromuscular Re-ed Wall ball circles 500g CW/CCW x30 ea Wall walks YTB around hands up/down/lat to fatigue Seated BIL ER with scap retraction RTB 2x10 Seated horizontal abduction RTB 2x10 Standing rows GTB 2x10 Standing shoulder extension GTB 2x10 Manual Therapy STM & TPR R UT and levator scap x10 mins pt positioned in supine  [facility] Adult PT Treatment:                                                DATE: 02/21/24 Therapeutic Exercise: Standing shoulder flexion #1 back against wall 2x10 Sitting shoulder ER with arm propped at 80 deg abd 2x10 Dowel IR stretch 10 hold x10 Seated ITYs x10 ea Neuromuscular Re-ed Wall ball circles 500g CW/CCW x30 ea Wall walks YTB around hands up/down/lat to fatigue Manual Therapy STM & TPR R UT and levator scap x8 mins pt positioned in supine   [facility] Adult PT Treatment:                                                DATE: 02/14/2024 Therapeutic Exercise  UBE L2; 3 min fwd, 3 min bwd  Standing shoulder  flexion eccentrics 1# off wall x10 Standing shoulder scaption eccentrics 1# off wall x10 Sitting shoulder ER with arm propped at 80 deg abd 2x10 Neuromuscular Re-ed Standing wall wash circles x10 (only able to perform to ~60 deg of abd) Standing wall wash diagonal x5 Standing mid trap setting off wall x10 Standing low trap setting off wall x5 Wall push up plus 2x10 Sitting modified mid row with arm propped on counter 2x10 Manual Therapy Gentle shoulder mobs using pillow case grade II to III STM & TPR R UT and levator scap    PATIENT EDUCATION: Education details: Pt received education regarding HEP performance, ADL performance, functional activity tolerance, impairment education, appropriate performance of therapeutic activities. Person educated: Patient Education method: Explanation, Demonstration, Tactile cues, Verbal cues, and Handouts Education comprehension: verbalized understanding and returned demonstration  HOME EXERCISE PROGRAM: Access Code: [email or web address] URL: [email or web address] Date: 01/27/2024 Prepared  by: Gellen April Earnie Starring  Exercises - Standing Shoulder Row with Anchored Resistance  - 1 x daily - 7 x weekly - 2 sets - 10 reps - Shoulder extension with resistance - Neutral  - 1 x daily - 7 x weekly - 2 sets - 10 reps - Shoulder External Rotation and Scapular Retraction  - 1 x daily - 7 x weekly - 2 sets - 10 reps - Shoulder External Rotation in 45 Degrees Abduction  - 1 x daily - 7 x weekly - 2 sets - 10 reps - Standing Shoulder Abduction Slides at Wall  - 1 x daily - 7 x weekly - 1 sets - 10 reps - Standing Single Shoulder Flexion Wall Slide with Palm Up (Mirrored)  - 1 x daily - 7 x weekly - 1 sets - 10 reps ---------------------------------------------------------------------------------------------  ASSESSMENT:  CLINICAL IMPRESSION: Patient presents to PT reporting no current pain, had some soreness after previous session. Session today  continued to focus on shoulder and periscapular strengthening and use of manual techniques to decrease tension with patient reporting therapeutic benefit. Patient continues to benefit from skilled PT services and should be progressed as able to improve functional independence.  Eval impression (12/01/2023): Pt. attended today's physical therapy session for evaluation of R TSA. Pt had a R TSA on 11/03/2023, putting her 4 weeks out as of today 12/01/2023. Pt has notable deficits with shoulder mobility, strength, stability, and pain levels. Pt would benefit from therapeutic focus on P/AAROM and periscapular strengthening in line with surgical protocol. Protocol is written out on the 2nd page of the 11/23/2023 referral seen in the media tab on [employer].  Treatment performed today focused on pt education detailed in the objective. Pt demonstrated great understanding of education provided. required minimal v/t cues and no assistance for appropriate performance with today's activities.  Pts next protocol stage will be s/p 6 wks on 12/15/2023. Pt requires the intervention of skilled outpatient physical therapy to address the aforementioned deficits and progress towards a functional level in line with therapeutic goals.    OBJECTIVE IMPAIRMENTS: decreased mobility, decreased strength, hypomobility, impaired UE functional use, improper body mechanics, postural dysfunction, and pain.   ACTIVITY LIMITATIONS: lifting, sleeping, bed mobility, bathing, toileting, dressing, and reach over head  PARTICIPATION LIMITATIONS: cleaning, laundry, driving, shopping, and community activity  PERSONAL FACTORS: Age, Fitness, Past/current experiences, Time since onset of injury/illness/exacerbation, and 1-2 comorbidities: DM2 are also affecting patient's functional outcome.   REHAB POTENTIAL: Good  CLINICAL DECISION MAKING: Stable/uncomplicated  EVALUATION COMPLEXITY: Low  GOALS: Goals reviewed with patient? Yes  SHORT TERM  GOALS: Target date: 12/29/2023  Pt will be independent with administered HEP to demonstrate the competency necessary for long term managemnet of symptoms at home.  Baseline: Goal status: MET Pt reports adherence 12/22/23   LONG TERM GOALS: Target date: 03/06/2024   Pt. Will achieve a DASH score of  40% as to demonstrate improvement in self-perceived functional ability with daily activities.  Baseline: ~80% 01/24/24: QuickDASH Score: 40.9 / 100 = 40.9 % Goal status: IN PROGRESS  2.  Pt will report pain levels improving during ADLs to be less than or equal to 2/10 as to demonstrate improved tolerance with daily functional activities such as bathing and dressing. Baseline: 8/10 01/24/24: at most 7/10  Goal status: IN PROGRESS  3.  Pt will improve MMT score for R global shoulder strengh to a 4+/5 to demonstrate improvement in strength for quality of motion and activity performance.  Baseline:  deferred 01/24/24: 3-/5 Goal status: IN PROGRESS  4.  Pt will improve R shoulder flexion and abduction AROM to 160d to demonstrate volitional mobility neccessary for functional overhead mobility utilized during ADLs such as cleaning and washing. Baseline: see obj chart 01/24/24: shoulder flexion 95, abd 87 in supine Goal status: IN PROGRESS  ---------------------------------------------------------------------------------------------  PLAN: PT FREQUENCY: 1-2x/week  PT DURATION: 6 weeks  PLANNED INTERVENTIONS: 97110-Therapeutic exercises, 97530- Therapeutic activity, [id number]- Neuromuscular re-education, 97535- Self Care, [id number]- Manual therapy, [id number]- Electrical stimulation (unattended), [id number]- Vasopneumatic device, 97033- Ionotophoresis 4mg /ml Dexamethasone , Patient/Family education, Taping, Joint mobilization, DME instructions, Cryotherapy, and Moist heat  PLAN FOR NEXT SESSION: Increase AROM and resistance as able.    Lesleyanne Politte, SPTA 02/23/2024 11:50 AM

## 2024-02-26 ENCOUNTER — Ambulatory Visit (HOSPITAL_COMMUNITY)
Admission: EM | Admit: 2024-02-26 | Discharge: 2024-02-26 | Disposition: A | Discharge status: Home / Self Care | Attending: Internal Medicine | Admitting: Internal Medicine

## 2024-02-26 ENCOUNTER — Encounter (HOSPITAL_COMMUNITY): Payer: Self-pay | Admitting: *Deleted

## 2024-02-26 ENCOUNTER — Other Ambulatory Visit: Payer: Self-pay

## 2024-02-26 DIAGNOSIS — R21 Rash and other nonspecific skin eruption: Secondary | ICD-10-CM

## 2024-02-26 DIAGNOSIS — T7840XA Allergy, unspecified, initial encounter: Secondary | ICD-10-CM | POA: Diagnosis not present

## 2024-02-26 MED ORDER — DEXAMETHASONE SOD PHOSPHATE PF 10 MG/ML IJ SOLN
10.0000 mg | Freq: Once | INTRAMUSCULAR | Status: AC
  Administered 2024-02-26: 10 mg via INTRAMUSCULAR

## 2024-02-26 MED ORDER — METHYLPREDNISOLONE 4 MG PO TBPK: ORAL_TABLET | ORAL | 0 refills | Status: AC

## 2024-02-26 MED ORDER — DESONIDE 0.05 % EX CREA: TOPICAL_CREAM | Freq: Two times a day (BID) | CUTANEOUS | 0 refills | Status: AC

## 2024-02-26 NOTE — Discharge Instructions (Addendum)
 Symptoms and physical exam findings are consistent with an allergic reaction.  Due to this affecting the lips and mouth we will treat aggressively with an injection of steroids in the clinic followed by steroids by mouth.  We can also use a topical steroid on the areas that are forming rashes.  Reassuringly there is no signs of anaphylaxis.  Vital signs and physical exam findings are also reassuring.  We will treat with the following: Decadron  injection given today. This is a steroid to help with inflammation and pain. Start 02/27/24: Medrol  dose pack. Follow package insert. Desonide  cream to the area above the lip and to the area around the eyebrow twice daily as needed for rash. Do not apply to the eyelid.  Make sure to stay hydrated by drinking plenty of water . If you develop shortness of breath, difficulty breathing, increased swelling in the lips or mouth, trouble swallowing, then go to the ER or call 911 immediately.  Return to urgent care or PCP if symptoms fail to resolve.

## 2024-02-26 NOTE — ED Provider Notes (Signed)
 [facility]    CSN: [id number] Arrival date & time: 02/26/24  1735      History   Chief Complaint Chief Complaint  Patient presents with   Allergic Reaction    HPI Norma [name] is a 63 y.o. female.   63 y.o. female who presents to urgent care with complaints of an allergic reaction.  She reports on Tuesday she was eating at a restaurant and afterwards she started feeling a tingling sensation in her lips.  This was followed by a tingling sensation in her tongue.  On Friday she felt the symptoms were getting worse with a feeling like there was some swelling in her mouth as well as a feeling of needing to clear her throat.  Because the symptoms have persisted she came in today.  She denies any food allergies although she does have an allergy to prednisone .  She reports she has had Decadron  and Medrol  in the past.  She reports that prednisone  causes the same symptoms that she is experiencing now.   Allergic Reaction Presenting symptoms: rash     Past Medical History:  Diagnosis Date   A-fib (HCC)    Acid reflux    Allergy    Anemia    Anxiety    Arthritis    Back pain    Depression    Diabetes mellitus without complication (HCC)    on meds   Dysrhythmia    Fatty liver    Headache    stress headaches, migraines at times   Heart murmur    Hypertension    Irregular heart beat    Left shoulder pain    Shortness of breath dyspnea    with exertion   Tachyarrhythmia    Trigger point of thoracic region 03/22/2012    Patient Active Problem List   Diagnosis Date Noted   Left foot pain 05/17/2023   Elevated serum creatinine 05/17/2023   Epidermal inclusion cyst 12/06/2022   Viral syndrome 04/05/2022   Generalized abdominal cramping 11/27/2021   Tendinopathy of right rotator cuff 04/04/2020   HCV antibody positive 03/18/2020   RUQ pain 03/17/2020   Biliary colic symptom    Elevated liver enzymes    Abnormal findings on diagnostic imaging of liver and  biliary tract    Abdominal pain 03/16/2020   Cholelithiasis 03/16/2020   Hypertensive urgency 03/16/2020   Type 2 diabetes mellitus (HCC) 03/16/2020   Lead exposure 11/14/2019   TMJ (temporomandibular joint disorder) 07/31/2019   Left hip pain 02/27/2019   Uterine mass 12/19/2018   Allergic rhinitis with postnasal drip 11/17/2018   Abdominal pain, right lower quadrant 06/23/2018   Diverticulosis of colon without hemorrhage 06/23/2018   Intractable migraine without aura and without status migrainosus 02/16/2018   Iron deficiency anemia 03/29/2017   Vitamin D  deficiency 03/01/2017   Lumbar herniated disc 06/06/2014   Depression    Spinal stenosis, lumbar region, with neurogenic claudication 11/21/2013   Hot flashes 06/30/2011   Eczema, dyshidrotic 06/02/2010   Leg cramps 05/06/2010   FIBROIDS, UTERUS 10/24/2009   GERD 06/09/2009   Chronic pain syndrome 10/04/2008   Hepatitis C carrier (HCC) 03/21/2007   Obesity, Class II, BMI 35-39.9 06/16/2006   HYPERTENSION, BENIGN SYSTEMIC 06/16/2006    Past Surgical History:  Procedure Laterality Date   ANKLE SURGERY     CHOLECYSTECTOMY N/A 03/20/2020   Procedure: LAPAROSCOPIC CHOLECYSTECTOMY WITH INTRAOPERATIVE CHOLANGIOGRAM AND WEDGE LIVER BIOPSY;  Surgeon: Gladis Cough, MD;  Location: [facility];  Service: General;  Laterality: N/A;   KNEE SURGERY Left    LUMBAR LAMINECTOMY/DECOMPRESSION MICRODISCECTOMY Left 06/06/2014   Procedure: LUMBAR LAMINECTOMY/DECOMPRESSION MICRODISCECTOMY 1 LEVEL;  Surgeon: Reyes Budge, MD;  Location: [facility];  Service: Neurosurgery;  Laterality: Left;  Left L5S1 microdiskectomy   REVERSE SHOULDER ARTHROPLASTY Right 11/03/2023   Procedure: ARTHROPLASTY, SHOULDER, TOTAL, REVERSE;  Surgeon: Dozier Soulier, MD;  Location: [facility];  Service: Orthopedics;  Laterality: Right;   ROTATOR CUFF REPAIR Bilateral 02/20/2014   TUBAL LIGATION      OB History     Gravida  4   Para  4   Term  4   Preterm       AB  0   Living  4      SAB  0   IAB  0   Ectopic  0   Multiple  0   Live Births  4            Home Medications    Prior to Admission medications   Medication Sig Start Date End Date Taking? Authorizing Provider  albuterol (VENTOLIN HFA) 108 (90 Base) MCG/ACT inhaler Inhale 1-2 puffs into the lungs every 4 (four) hours as needed for wheezing or shortness of breath. 08/25/20  Yes [provider]  ALPRAZolam (XANAX) 0.25 MG tablet Take 0.25 mg by mouth 3 (three) times daily as needed for anxiety. 07/14/22  Yes [provider]  atorvastatin  (LIPITOR) 10 MG tablet Take 1 tablet (10 mg total) by mouth daily. 10/01/23  Yes Mabe, Elna, MD  Blood Glucose Monitoring Suppl (ONE TOUCH ULTRA 2) w/Device KIT Use as directed 08/05/22  Yes McDiarmid, Krystal BIRCH, MD  buPROPion  (WELLBUTRIN  SR) 200 MG 12 hr tablet Take 200 mg by mouth 2 (two) times daily. 08/22/23  Yes [provider]  cyclobenzaprine  (FLEXERIL ) 10 MG tablet Take 10 mg by mouth 4 (four) times daily as needed for muscle spasms. 07/14/22  Yes [provider]  desonide  (DESOWEN ) 0.05 % cream Apply topically 2 (two) times daily. 02/26/24  Yes Amie Cowens A, PA-C  dicyclomine  (BENTYL ) 10 MG capsule Take 10 mg by mouth 3 (three) times daily as needed for spasms. 07/13/22  Yes [provider]  ferrous gluconate  (FERGON) 324 MG tablet Take 1 tablet (324 mg total) by mouth daily with breakfast. 04/08/23  Yes Rosendo Rush, MD  hydrochlorothiazide  (HYDRODIURIL ) 50 MG tablet Take 50 mg by mouth daily. 08/30/23  Yes [provider]  Lancets (ONETOUCH DELICA PLUS [place]) MISC Use as directed 06/29/22  Yes [provider]  linaclotide (LINZESS) 290 MCG CAPS capsule Take 290 mcg by mouth daily before breakfast.   Yes [provider]  meloxicam  (MOBIC ) 7.5 MG tablet Take 1 tablet (7.5 mg total) by mouth daily. 12/15/23  Yes Daralene Bruckner D, PA-C  methylPREDNISolone  (MEDROL   DOSEPAK) 4 MG TBPK tablet Follow package inserts 02/26/24  Yes Teresa Almarie LABOR, PA-C  metoprolol  succinate (TOPROL -XL) 25 MG 24 hr tablet Take 25 mg by mouth daily. 08/25/23  Yes [provider]  OLANZapine (ZYPREXA) 2.5 MG tablet Take 2.5 mg by mouth at bedtime. 10/14/23  Yes [provider]  omeprazole  (PRILOSEC) 20 MG capsule Take 2 capsules (40 mg total) by mouth daily. 05/25/19  Yes Meccariello, Con PARAS, MD  oxyCODONE -acetaminophen  (PERCOCET) 10-325 MG tablet Take 1 tablet by mouth every 4 (four) hours as needed for pain. 11/03/23  Yes [place], [facility], PA-C  RESTASIS 0.05 % ophthalmic emulsion Place 1 drop into both eyes 2 (two) times  daily. 11/03/20  Yes [provider]  Semaglutide , 2 MG/DOSE, 8 MG/3ML SOPN Please inject 2 mg weekly. 06/29/22  Yes Nicholas Bar, MD  SYMBICORT 160-4.5 MCG/ACT inhaler Inhale 2 puffs into the lungs in the morning and at bedtime. 12/25/20  Yes [provider]  traZODone  (DESYREL ) 100 MG tablet TAKE 2 TABLETS BY MOUTH ONCE AT BEDTIME 05/31/19  Yes Meccariello, Con PARAS, MD  Vitamin D , Ergocalciferol , (DRISDOL ) 50000 units CAPS capsule TAKE 1 CAPSULE BY MOUTH EVERY 7 (SEVEN) DAYS. 12/09/17  Yes Meccariello, Con PARAS, MD  azelastine  (ASTELIN ) 0.1 % nasal spray Place 2 sprays into both nostrils 2 (two) times daily. Use in each nostril as directed Patient taking differently: Place 2 sprays into both nostrils 2 (two) times daily as needed for rhinitis or allergies. Use in each nostril as directed 08/01/23   Everhart, Kirstie, DO  cetirizine  (ZYRTEC  ALLERGY) 10 MG tablet Take 1 tablet (10 mg total) by mouth daily. Patient taking differently: Take 10 mg by mouth daily as needed for allergies. 11/07/20   Graham, Laura E, PA-C  diclofenac  Sodium (VOLTAREN ) 1 % GEL Please apply 1-2 times daily to affected area Patient taking differently: Apply 1 Application topically 2 (two) times daily as needed (joint pain). 10/09/19   Delane Lye, MD   EPINEPHrine  0.3 mg/0.3 mL IJ SOAJ injection Inject into the muscle as directed. 07/22/20   [provider]  fluticasone  (FLONASE ) 50 MCG/ACT nasal spray Place 2 sprays into both nostrils daily. Patient taking differently: Place 2 sprays into both nostrils daily as needed for allergies. 08/01/23   Everhart, Kirstie, DO  gabapentin  (NEURONTIN ) 800 MG tablet Take 1 tablet (800 mg total) by mouth 4 (four) times daily. Patient taking differently: Take 800 mg by mouth 2 (two) times daily. 02/19/22     lidocaine  (LIDODERM ) 5 % place ONE PATCH onto THE SKIN. REMOVE AND discard WITH in 24 HOURS 02/16/24   Nicholas Bar, MD  metFORMIN  (GLUCOPHAGE -XR) 500 MG 24 hr tablet TAKE ONE TABLET BY MOUTH TWICE DAILY With meals 02/23/24   Nicholas Bar, MD  naloxone [facility]) nasal spray 4 mg/0.1 mL Place 1 spray into the nose once. 12/29/20   [provider]  olopatadine (PATANOL) 0.1 % ophthalmic solution Place 1 drop into both eyes 2 (two) times daily as needed for allergies. 10/27/20   [provider]  REZDIFFRA 100 MG TABS Take 1 tablet by mouth daily. 09/27/23   [provider]  ferrous sulfate  325 (65 FE) MG tablet Take 1 tablet (325 mg total) by mouth 2 (two) times daily with a meal. 02/22/19 04/14/19  Shirley, Jordan, DO  loratadine  (CLARITIN ) 10 MG tablet Take 1 tablet (10 mg total) by mouth daily. 04/11/19 08/12/19  Meccariello, Con PARAS, MD    Family History Family History  Problem Relation Age of Onset   Diabetes Brother    Diabetes Maternal Aunt    Diabetes Paternal Aunt    Diabetes Maternal Grandmother    Diabetes Maternal Grandfather    Diabetes Paternal Grandmother    Diabetes Paternal Grandfather    Congestive Heart Failure Mother    Hypertension Son    Colon cancer Neg Hx    Esophageal cancer Neg Hx    Rectal cancer Neg Hx    Stomach cancer Neg Hx     Social History Social History   Tobacco Use   Smoking status: Never    Passive exposure: Never    Smokeless tobacco: Never  Vaping Use   Vaping  status: Never Used  Substance Use Topics   Alcohol  use: Not Currently    Comment: glass of wine occasionally   Drug use: No    Comment: States no longer uses marijuana     Allergies   Soma  [carisoprodol ], Nortriptyline, and Prednisone    Review of Systems Review of Systems  Constitutional:  Negative for chills and fever.  HENT:  Positive for facial swelling. Negative for ear pain and sore throat.   Eyes:  Negative for pain and visual disturbance.  Respiratory:  Negative for cough and shortness of breath.   Cardiovascular:  Negative for chest pain and palpitations.  Gastrointestinal:  Negative for abdominal pain and vomiting.  Genitourinary:  Negative for dysuria and hematuria.  Musculoskeletal:  Negative for arthralgias and back pain.  Skin:  Positive for rash. Negative for color change.  Neurological:  Negative for seizures and syncope.  All other systems reviewed and are negative.    Physical Exam Triage Vital Signs ED Triage Vitals  Encounter Vitals Group     BP 02/26/24 1754 (!) 110/59     Girls Systolic BP Percentile --      Girls Diastolic BP Percentile --      Boys Systolic BP Percentile --      Boys Diastolic BP Percentile --      Pulse Rate 02/26/24 1754 94     Resp 02/26/24 1754 18     Temp 02/26/24 1754 98.6 F (37 C)     Temp src --      SpO2 02/26/24 1754 98 %     Weight --      Height --      Head Circumference --      Peak Flow --      Pain Score 02/26/24 1748 7     Pain Loc --      Pain Education --      Exclude from Growth Chart --    No data found.  Updated Vital Signs BP (!) 110/59   Pulse 94   Temp 98.6 F (37 C)   Resp 18   SpO2 98%   Visual Acuity Right Eye Distance:   Left Eye Distance:   Bilateral Distance:    Right Eye Near:   Left Eye Near:    Bilateral Near:     Physical Exam Vitals and nursing note reviewed.  Constitutional:      General: She is not in acute  distress.    Appearance: She is well-developed.  HENT:     Head: Normocephalic and atraumatic.     Nose: Nose normal.     Mouth/Throat:     Mouth: Mucous membranes are moist.  Eyes:     Extraocular Movements: Extraocular movements intact.     Conjunctiva/sclera: Conjunctivae normal.     Pupils: Pupils are equal, round, and reactive to light.  Cardiovascular:     Rate and Rhythm: Normal rate and regular rhythm.     Heart sounds: No murmur heard. Pulmonary:     Effort: Pulmonary effort is normal. No respiratory distress.     Breath sounds: Normal breath sounds. No wheezing.  Abdominal:     Palpations: Abdomen is soft.     Tenderness: There is no abdominal tenderness.  Musculoskeletal:        General: No swelling.     Cervical back: Neck supple.  Skin:    General: Skin is warm and dry.     Capillary Refill: Capillary refill takes less than  2 seconds.     Comments: Small raised reddish areas on the upper lip.  No visible rash within the oral cavity, no oropharyngeal swelling, tongue appears to be normal  Neurological:     Mental Status: She is alert.  Psychiatric:        Mood and Affect: Mood normal.      UC Treatments / Results  Labs (all labs ordered are listed, but only abnormal results are displayed) Labs Reviewed - No data to display  EKG   Radiology No results found.  Procedures Procedures (including critical care time)  Medications Ordered in UC Medications  dexamethasone  (DECADRON ) injection 10 mg (has no administration in time range)    Initial Impression / Assessment and Plan / UC Course  I have reviewed the triage vital signs and the nursing notes.  Pertinent labs & imaging results that were available during my care of the patient were reviewed by me and considered in my medical decision making (see chart for details).     Allergic reaction, initial encounter  Rash   Symptoms and physical exam findings are consistent with an allergic reaction.   Due to this affecting the lips and mouth we will treat aggressively with an injection of steroids in the clinic followed by steroids by mouth.  We can also use a topical steroid on the areas that are forming rashes.  Reassuringly there is no signs of anaphylaxis.  Vital signs and physical exam findings are also reassuring.  We will treat with the following: Decadron  injection given today. This is a steroid to help with inflammation and pain. Start 02/27/24: Medrol  dose pack. Follow package insert. Desonide  cream to the area above the lip and to the area around the eyebrow twice daily as needed for rash. Do not apply to the eyelid.  Make sure to stay hydrated by drinking plenty of water . If you develop shortness of breath, difficulty breathing, increased swelling in the lips or mouth, trouble swallowing, then go to the ER or call 911 immediately.  Return to urgent care or PCP if symptoms fail to resolve.    Final Clinical Impressions(s) / UC Diagnoses   Final diagnoses:  Allergic reaction, initial encounter  Rash     Discharge Instructions      Symptoms and physical exam findings are consistent with an allergic reaction.  Due to this affecting the lips and mouth we will treat aggressively with an injection of steroids in the clinic followed by steroids by mouth.  We can also use a topical steroid on the areas that are forming rashes.  Reassuringly there is no signs of anaphylaxis.  Vital signs and physical exam findings are also reassuring.  We will treat with the following: Decadron  injection given today. This is a steroid to help with inflammation and pain. Start 02/27/24: Medrol  dose pack. Follow package insert. Desonide  cream to the area above the lip and to the area around the eyebrow twice daily as needed for rash. Do not apply to the eyelid.  Make sure to stay hydrated by drinking plenty of water . If you develop shortness of breath, difficulty breathing, increased swelling in the lips  or mouth, trouble swallowing, then go to the ER or call 911 immediately.  Return to urgent care or PCP if symptoms fail to resolve.      ED Prescriptions     Medication Sig Dispense Auth. Provider   methylPREDNISolone  (MEDROL  DOSEPAK) 4 MG TBPK tablet Follow package inserts 1 each Teresa Almarie LABOR,  PA-C   desonide  (DESOWEN ) 0.05 % cream Apply topically 2 (two) times daily. 30 g Teresa Almarie LABOR, [place]      PDMP not reviewed this encounter.   Teresa Almarie LABOR, [place] 02/26/24 1850

## 2024-02-26 NOTE — ED Triage Notes (Addendum)
 PT reports her mouth broke out in an allergic reaction around her mouth. On Tuesday. On Friday she felt like the inside of her mouth felt like it was swelling. Pt also reports the rash has spread up her face. Pt is speaking in full sentences and no SHOB noted. PT took Zertec for [employer]

## 2024-02-28 ENCOUNTER — Ambulatory Visit

## 2024-02-28 DIAGNOSIS — M25511 Pain in right shoulder: Secondary | ICD-10-CM

## 2024-02-28 DIAGNOSIS — M25611 Stiffness of right shoulder, not elsewhere classified: Secondary | ICD-10-CM

## 2024-02-28 DIAGNOSIS — M6281 Muscle weakness (generalized): Secondary | ICD-10-CM

## 2024-02-28 NOTE — Therapy (Signed)
 OUTPATIENT PHYSICAL THERAPY NOTE   Patient Name: Norma [name] MRN: [id number] DOB:May 05, 1960, 63 y.o., female Today's Date: 02/28/2024  END OF SESSION:      Past Medical History:  Diagnosis Date   A-fib (HCC)    Acid reflux    Allergy    Anemia    Anxiety    Arthritis    Back pain    Depression    Diabetes mellitus without complication (HCC)    on meds   Dysrhythmia    Fatty liver    Headache    stress headaches, migraines at times   Heart murmur    Hypertension    Irregular heart beat    Left shoulder pain    Shortness of breath dyspnea    with exertion   Tachyarrhythmia    Trigger point of thoracic region 03/22/2012   Past Surgical History:  Procedure Laterality Date   ANKLE SURGERY     CHOLECYSTECTOMY N/A 03/20/2020   Procedure: LAPAROSCOPIC CHOLECYSTECTOMY WITH INTRAOPERATIVE CHOLANGIOGRAM AND WEDGE LIVER BIOPSY;  Surgeon: Gladis Cough, MD;  Location: [facility];  Service: General;  Laterality: N/A;   KNEE SURGERY Left    LUMBAR LAMINECTOMY/DECOMPRESSION MICRODISCECTOMY Left 06/06/2014   Procedure: LUMBAR LAMINECTOMY/DECOMPRESSION MICRODISCECTOMY 1 LEVEL;  Surgeon: Reyes Budge, MD;  Location: [facility];  Service: Neurosurgery;  Laterality: Left;  Left L5S1 microdiskectomy   REVERSE SHOULDER ARTHROPLASTY Right 11/03/2023   Procedure: ARTHROPLASTY, SHOULDER, TOTAL, REVERSE;  Surgeon: Dozier Soulier, MD;  Location: [facility];  Service: Orthopedics;  Laterality: Right;   ROTATOR CUFF REPAIR Bilateral 02/20/2014   TUBAL LIGATION     Patient Active Problem List   Diagnosis Date Noted   Left foot pain 05/17/2023   Elevated serum creatinine 05/17/2023   Epidermal inclusion cyst 12/06/2022   Viral syndrome 04/05/2022   Generalized abdominal cramping 11/27/2021   Tendinopathy of right rotator cuff 04/04/2020   HCV antibody positive 03/18/2020   RUQ pain 03/17/2020   Biliary colic symptom    Elevated liver enzymes    Abnormal findings on diagnostic  imaging of liver and biliary tract    Abdominal pain 03/16/2020   Cholelithiasis 03/16/2020   Hypertensive urgency 03/16/2020   Type 2 diabetes mellitus (HCC) 03/16/2020   Lead exposure 11/14/2019   TMJ (temporomandibular joint disorder) 07/31/2019   Left hip pain 02/27/2019   Uterine mass 12/19/2018   Allergic rhinitis with postnasal drip 11/17/2018   Abdominal pain, right lower quadrant 06/23/2018   Diverticulosis of colon without hemorrhage 06/23/2018   Intractable migraine without aura and without status migrainosus 02/16/2018   Iron deficiency anemia 03/29/2017   Vitamin D  deficiency 03/01/2017   Lumbar herniated disc 06/06/2014   Depression    Spinal stenosis, lumbar region, with neurogenic claudication 11/21/2013   Hot flashes 06/30/2011   Eczema, dyshidrotic 06/02/2010   Leg cramps 05/06/2010   FIBROIDS, UTERUS 10/24/2009   GERD 06/09/2009   Chronic pain syndrome 10/04/2008   Hepatitis C carrier (HCC) 03/21/2007   Obesity, Class II, BMI 35-39.9 06/16/2006   HYPERTENSION, BENIGN SYSTEMIC 06/16/2006    PCP: Nicholas Bar, MD  REFERRING PROVIDER: Dozier Soulier, MD  REFERRING DIAG:  Free Text Diagnosis  S/P RIGHT TSA    Rationale for Evaluation and Treatment: Rehabilitation  THERAPY DIAG:  Right shoulder pain, unspecified chronicity  Stiffness of right shoulder, not elsewhere classified  Muscle weakness (generalized)  PERTINENT HISTORY: GERD, diverticulosis, DM2, HTN  WEIGHT BEARING RESTRICTIONS: No  FALLS:  Has patient fallen in last 6 months?  No  LIVING ENVIRONMENT: Lives with: lives alone Lives in: House/apartment Stairs: No Has following equipment at home: Single point cane  OCCUPATION: Not currently   PRECAUTIONS: Shoulder no lifting more than 5 lb, no IR behind back ---------------------------------------------------------------------------------------------  SUBJECTIVE:                                                                                                                                                                                       SUBJECTIVE STATEMENT: Patient reports having no pain but some soreness from last session, some pain down the arm last night, resolved today.  Patient reports no pain and has been compliant with her HEP.  Eval statement 12/01/2023: had R TSA on 11/03/2023 Has difficulties with lifting, bathing, and ADLs such as making the bed. Currently 0/10 pain, minimal swelling around surgical site, deep aching pain referred from surgery site to midline of arm   Hand dominance: Right  RED FLAGS: None   PLOF: Independent  PATIENT GOALS: be able to lift the shoulder  NEXT MD VISIT: n/a ---------------------------------------------------------------------------------------------  OBJECTIVE:  Note: Objective measures were completed at Evaluation unless otherwise noted.  DIAGNOSTIC FINDINGS:  N/a  PATIENT SURVEYS :  Quick dash: ~80%  COGNITION: Overall cognitive status: Within functional limits for tasks assessed     SENSATION: WFL  POSTURE: Forward head, increased kyphosis  UPPER EXTREMITY ROM:   ROM Right eval Left eval Right 01/04/24 Right 02/14/24  Shoulder flexion P: 80d WFL A: 95 sup A: 118 sit  Shoulder extension  WFL    Shoulder abduction P: 80d WFL A: 87 sup A: 102 sit  Shoulder adduction      Shoulder internal rotation P: 70d WFL    Shoulder external rotation P: 10d WFL A: 25 sitting A: 42 sit  Elbow flexion      Elbow extension      Wrist flexion      Wrist extension      Wrist ulnar deviation      Wrist radial deviation      Wrist pronation      Wrist supination      (Blank rows = not tested)  UPPER EXTREMITY [id number] d/t pain and recent surgery  MMT Right eval Left eval  Shoulder flexion    Shoulder extension    Shoulder abduction    Shoulder adduction    Shoulder internal rotation    Shoulder external rotation    Middle trapezius     Lower trapezius    Elbow flexion    Elbow extension    Wrist flexion    Wrist extension    Wrist ulnar deviation    Wrist  radial deviation    Wrist pronation    Wrist supination    Grip strength (lbs)    (Blank rows = not tested)  SHOULDER SPECIAL TESTS: deferred  JOINT MOBILITY TESTING:  deferred  PALPATION:  Increased sensitivity around surgical site  TODAY'S TREATMENT:  [facility] Adult PT Treatment:                                                DATE: 02/28/2024  Therapeutic Exercise: Standing shoulder flexion #1 back against wall 2x10 Standing shoulder abduction 1# back against wall 2x10   Neuromuscular Re-ed Wall ball circles 500g CW/CCW x30 ea Wall walks around hands up/down/lat to fatigue Seated BIL ER with scap retraction RTB 2x10 Seated horizontal abduction RTB 2x10 Standing rows RTB 2x10 Standing shoulder extension GTB 2x10    [facility] Adult PT Treatment:                                                DATE: 02/23/24 Therapeutic Exercise: Standing shoulder flexion #1 back against wall 2x10 Dowel IR stretch 10 hold x10 Seated ITYs x10 ea Neuromuscular Re-ed Wall ball circles 500g CW/CCW x30 ea Wall walks YTB around hands up/down/lat to fatigue Seated BIL ER with scap retraction RTB 2x10 Seated horizontal abduction RTB 2x10 Standing rows GTB 2x10 Standing shoulder extension GTB 2x10 Manual Therapy STM & TPR R UT and levator scap x10 mins pt positioned in supine  [facility] Adult PT Treatment:                                                DATE: 02/21/24 Therapeutic Exercise: Standing shoulder flexion #1 back against wall 2x10 Sitting shoulder ER with arm propped at 80 deg abd 2x10 Dowel IR stretch 10 hold x10 Seated ITYs x10 ea Neuromuscular Re-ed Wall ball circles 500g CW/CCW x30 ea Wall walks YTB around hands up/down/lat to fatigue Manual Therapy STM & TPR R UT and levator scap x8 mins pt positioned in supine     PATIENT EDUCATION: Education  details: Pt received education regarding HEP performance, ADL performance, functional activity tolerance, impairment education, appropriate performance of therapeutic activities. Person educated: Patient Education method: Explanation, Demonstration, Tactile cues, Verbal cues, and Handouts Education comprehension: verbalized understanding and returned demonstration  HOME EXERCISE PROGRAM: Access Code: [email or web address] URL: [email or web address] Date: 01/27/2024 Prepared by: Gellen April Earnie Starring  Exercises - Standing Shoulder Row with Anchored Resistance  - 1 x daily - 7 x weekly - 2 sets - 10 reps - Shoulder extension with resistance - Neutral  - 1 x daily - 7 x weekly - 2 sets - 10 reps - Shoulder External Rotation and Scapular Retraction  - 1 x daily - 7 x weekly - 2 sets - 10 reps - Shoulder External Rotation in 45 Degrees Abduction  - 1 x daily - 7 x weekly - 2 sets - 10 reps - Standing Shoulder Abduction Slides at Wall  - 1 x daily - 7 x weekly - 1 sets - 10 reps - Standing Single Shoulder Flexion Wall Slide with Palm Up (Mirrored)  -  1 x daily - 7 x weekly - 1 sets - 10 reps ---------------------------------------------------------------------------------------------  ASSESSMENT:  CLINICAL IMPRESSION: Patient had good tolerance of exercises today. Patient continues to benefit from skilled PT services and should be progressed as able to improve functional independence.  Eval impression (12/01/2023): Pt. attended today's physical therapy session for evaluation of R TSA. Pt had a R TSA on 11/03/2023, putting her 4 weeks out as of today 12/01/2023. Pt has notable deficits with shoulder mobility, strength, stability, and pain levels. Pt would benefit from therapeutic focus on P/AAROM and periscapular strengthening in line with surgical protocol. Protocol is written out on the 2nd page of the 11/23/2023 referral seen in the media tab on [employer].  Treatment performed today focused on pt  education detailed in the objective. Pt demonstrated great understanding of education provided. required minimal v/t cues and no assistance for appropriate performance with today's activities.  Pts next protocol stage will be s/p 6 wks on 12/15/2023. Pt requires the intervention of skilled outpatient physical therapy to address the aforementioned deficits and progress towards a functional level in line with therapeutic goals.    OBJECTIVE IMPAIRMENTS: decreased mobility, decreased strength, hypomobility, impaired UE functional use, improper body mechanics, postural dysfunction, and pain.   ACTIVITY LIMITATIONS: lifting, sleeping, bed mobility, bathing, toileting, dressing, and reach over head  PARTICIPATION LIMITATIONS: cleaning, laundry, driving, shopping, and community activity  PERSONAL FACTORS: Age, Fitness, Past/current experiences, Time since onset of injury/illness/exacerbation, and 1-2 comorbidities: DM2 are also affecting patient's functional outcome.   REHAB POTENTIAL: Good  CLINICAL DECISION MAKING: Stable/uncomplicated  EVALUATION COMPLEXITY: Low  GOALS: Goals reviewed with patient? Yes  SHORT TERM GOALS: Target date: 12/29/2023  Pt will be independent with administered HEP to demonstrate the competency necessary for long term managemnet of symptoms at home.  Baseline: Goal status: MET Pt reports adherence 12/22/23   LONG TERM GOALS: Target date: 03/06/2024   Pt. Will achieve a DASH score of  40% as to demonstrate improvement in self-perceived functional ability with daily activities.  Baseline: ~80% 01/24/24: QuickDASH Score: 40.9 / 100 = 40.9 % Goal status: IN PROGRESS  2.  Pt will report pain levels improving during ADLs to be less than or equal to 2/10 as to demonstrate improved tolerance with daily functional activities such as bathing and dressing. Baseline: 8/10 01/24/24: at most 7/10  Goal status: IN PROGRESS  3.  Pt will improve MMT score for R global  shoulder strengh to a 4+/5 to demonstrate improvement in strength for quality of motion and activity performance.  Baseline: deferred 01/24/24: 3-/5 Goal status: IN PROGRESS  4.  Pt will improve R shoulder flexion and abduction AROM to 160d to demonstrate volitional mobility neccessary for functional overhead mobility utilized during ADLs such as cleaning and washing. Baseline: see obj chart 01/24/24: shoulder flexion 95, abd 87 in supine Goal status: IN PROGRESS  ---------------------------------------------------------------------------------------------  PLAN: PT FREQUENCY: 1-2x/week  PT DURATION: 6 weeks  PLANNED INTERVENTIONS: 97110-Therapeutic exercises, 97530- Therapeutic activity, [id number]- Neuromuscular re-education, 97535- Self Care, [id number]- Manual therapy, [id number]- Electrical stimulation (unattended), 97016- Vasopneumatic device, [id number]- Ionotophoresis 4mg /ml Dexamethasone , Patient/Family education, Taping, Joint mobilization, DME instructions, Cryotherapy, and Moist heat  PLAN FOR NEXT SESSION: Increase AROM and resistance as able.    Marko Molt, PT, DPT  03/01/2024 8:15 AM

## 2024-03-01 ENCOUNTER — Encounter: Payer: Self-pay | Admitting: Physical Therapy

## 2024-03-01 ENCOUNTER — Ambulatory Visit: Admitting: Physical Therapy

## 2024-03-01 DIAGNOSIS — M25511 Pain in right shoulder: Secondary | ICD-10-CM

## 2024-03-01 DIAGNOSIS — M6281 Muscle weakness (generalized): Secondary | ICD-10-CM

## 2024-03-01 DIAGNOSIS — M25611 Stiffness of right shoulder, not elsewhere classified: Secondary | ICD-10-CM

## 2024-03-01 NOTE — Therapy (Signed)
 OUTPATIENT PHYSICAL THERAPY NOTE   Patient Name: Norma [name] MRN: [id number] DOB:[birth year], 63 y.o., female Today's Date: 03/01/2024  END OF SESSION:  PT End of Session - 03/01/24 1022     Visit Number 19    Number of Visits 22    Date for Recertification  03/06/24    Authorization Type [employer] MCR/MCD    PT Start Time 1021    PT Stop Time 1100    PT Time Calculation (min) 39 min    Activity Tolerance Patient tolerated treatment well    Behavior During Therapy WFL for tasks assessed/performed             Past Medical History:  Diagnosis Date   A-fib (HCC)    Acid reflux    Allergy    Anemia    Anxiety    Arthritis    Back pain    Depression    Diabetes mellitus without complication (HCC)    on meds   Dysrhythmia    Fatty liver    Headache    stress headaches, migraines at times   Heart murmur    Hypertension    Irregular heart beat    Left shoulder pain    Shortness of breath dyspnea    with exertion   Tachyarrhythmia    Trigger point of thoracic region 03/22/2012   Past Surgical History:  Procedure Laterality Date   ANKLE SURGERY     CHOLECYSTECTOMY N/A 03/20/2020   Procedure: LAPAROSCOPIC CHOLECYSTECTOMY WITH INTRAOPERATIVE CHOLANGIOGRAM AND WEDGE LIVER BIOPSY;  Surgeon: Gladis Cough, MD;  Location: [facility];  Service: General;  Laterality: N/A;   KNEE SURGERY Left    LUMBAR LAMINECTOMY/DECOMPRESSION MICRODISCECTOMY Left 06/06/2014   Procedure: LUMBAR LAMINECTOMY/DECOMPRESSION MICRODISCECTOMY 1 LEVEL;  Surgeon: Reyes Budge, MD;  Location: [facility];  Service: Neurosurgery;  Laterality: Left;  Left L5S1 microdiskectomy   REVERSE SHOULDER ARTHROPLASTY Right 11/03/2023   Procedure: ARTHROPLASTY, SHOULDER, TOTAL, REVERSE;  Surgeon: Dozier Soulier, MD;  Location: [facility];  Service: Orthopedics;  Laterality: Right;   ROTATOR CUFF REPAIR Bilateral 02/20/2014   TUBAL LIGATION     Patient Active Problem List   Diagnosis Date Noted   Left foot pain  05/17/2023   Elevated serum creatinine 05/17/2023   Epidermal inclusion cyst 12/06/2022   Viral syndrome 04/05/2022   Generalized abdominal cramping 11/27/2021   Tendinopathy of right rotator cuff 04/04/2020   HCV antibody positive 03/18/2020   RUQ pain 03/17/2020   Biliary colic symptom    Elevated liver enzymes    Abnormal findings on diagnostic imaging of liver and biliary tract    Abdominal pain 03/16/2020   Cholelithiasis 03/16/2020   Hypertensive urgency 03/16/2020   Type 2 diabetes mellitus (HCC) 03/16/2020   Lead exposure 11/14/2019   TMJ (temporomandibular joint disorder) 07/31/2019   Left hip pain 02/27/2019   Uterine mass 12/19/2018   Allergic rhinitis with postnasal drip 11/17/2018   Abdominal pain, right lower quadrant 06/23/2018   Diverticulosis of colon without hemorrhage 06/23/2018   Intractable migraine without aura and without status migrainosus 02/16/2018   Iron deficiency anemia 03/29/2017   Vitamin D  deficiency 03/01/2017   Lumbar herniated disc 06/06/2014   Depression    Spinal stenosis, lumbar region, with neurogenic claudication 11/21/2013   Hot flashes 06/30/2011   Eczema, dyshidrotic 06/02/2010   Leg cramps 05/06/2010   FIBROIDS, UTERUS 10/24/2009   GERD 06/09/2009   Chronic pain syndrome 10/04/2008   Hepatitis C carrier (HCC) 03/21/2007   Obesity, Class  II, BMI 35-39.9 06/16/2006   HYPERTENSION, BENIGN SYSTEMIC 06/16/2006    PCP: Nicholas Bar, MD  REFERRING PROVIDER: Dozier Soulier, MD  REFERRING DIAG:  Free Text Diagnosis  S/P RIGHT TSA    Rationale for Evaluation and Treatment: Rehabilitation  THERAPY DIAG:  Right shoulder pain, unspecified chronicity  Stiffness of right shoulder, not elsewhere classified  Muscle weakness (generalized)  PERTINENT HISTORY: GERD, diverticulosis, DM2, HTN  WEIGHT BEARING RESTRICTIONS: No  FALLS:  Has patient fallen in last 6 months? No  LIVING ENVIRONMENT: Lives with: lives alone Lives  in: House/apartment Stairs: No Has following equipment at home: Single point cane  OCCUPATION: Not currently   PRECAUTIONS: Shoulder no lifting more than 5 lb, no IR behind back ---------------------------------------------------------------------------------------------  SUBJECTIVE:                                                                                                                                                                                      SUBJECTIVE STATEMENT: Pt reports that her shoulder is doing well today.  Patient reports no pain and has been compliant with her HEP.  Eval statement 12/01/2023: had R TSA on 11/03/2023 Has difficulties with lifting, bathing, and ADLs such as making the bed. Currently 0/10 pain, minimal swelling around surgical site, deep aching pain referred from surgery site to midline of arm   Hand dominance: Right  RED FLAGS: None   PLOF: Independent  PATIENT GOALS: be able to lift the shoulder  NEXT MD VISIT: n/a ---------------------------------------------------------------------------------------------  OBJECTIVE:  Note: Objective measures were completed at Evaluation unless otherwise noted.  DIAGNOSTIC FINDINGS:  N/a  PATIENT SURVEYS :  Quick dash: ~80%  COGNITION: Overall cognitive status: Within functional limits for tasks assessed     SENSATION: WFL  POSTURE: Forward head, increased kyphosis  UPPER EXTREMITY ROM:   ROM Right eval Left eval Right 01/04/24 Right 02/14/24  Shoulder flexion P: 80d WFL A: 95 sup A: 118 sit  Shoulder extension  WFL    Shoulder abduction P: 80d WFL A: 87 sup A: 102 sit  Shoulder adduction      Shoulder internal rotation P: 70d WFL    Shoulder external rotation P: 10d WFL A: 25 sitting A: 42 sit  Elbow flexion      Elbow extension      Wrist flexion      Wrist extension      Wrist ulnar deviation      Wrist radial deviation      Wrist pronation      Wrist supination       (Blank rows = not tested)  UPPER EXTREMITY FFU:[id number] d/t pain and recent surgery  MMT Right eval Left eval  Shoulder flexion    Shoulder extension    Shoulder abduction    Shoulder adduction    Shoulder internal rotation    Shoulder external rotation    Middle trapezius    Lower trapezius    Elbow flexion    Elbow extension    Wrist flexion    Wrist extension    Wrist ulnar deviation    Wrist radial deviation    Wrist pronation    Wrist supination    Grip strength (lbs)    (Blank rows = not tested)  SHOULDER SPECIAL TESTS: deferred  JOINT MOBILITY TESTING:  deferred  PALPATION:  Increased sensitivity around surgical site  TODAY'S TREATMENT:  [facility] Adult PT Treatment:                                                DATE: 03/01/2024  Therapeutic Exercise: Scap retraction Supine flexion with pball - 2x10 Seated BIL ER with scap retraction RTB 3x10 Seated horizontal abduction RTB 2x10 Standing rows GTB 2x10 Standing shoulder extension RTB 2x10  Therapeutic Activity  Flexion ball taps - 2x to fatigue - 500g Wall walks around hands up/down/lat to fatigue Wall ball circles 500g CW/CCW x30 ea Standing shoulder flexion #1 back against wall 2x10 Standing shoulder abduction 1# back against wall 2x10     [facility] Adult PT Treatment:                                                DATE: 02/23/24 Therapeutic Exercise: Standing shoulder flexion #1 back against wall 2x10 Dowel IR stretch 10 hold x10 Seated ITYs x10 ea Neuromuscular Re-ed Wall ball circles 500g CW/CCW x30 ea Wall walks YTB around hands up/down/lat to fatigue Seated BIL ER with scap retraction RTB 2x10 Seated horizontal abduction RTB 2x10 Standing rows GTB 2x10 Standing shoulder extension GTB 2x10 Manual Therapy STM & TPR R UT and levator scap x10 mins pt positioned in supine  [facility] Adult PT Treatment:                                                DATE: 02/21/24 Therapeutic Exercise: Standing  shoulder flexion #1 back against wall 2x10 Sitting shoulder ER with arm propped at 80 deg abd 2x10 Dowel IR stretch 10 hold x10 Seated ITYs x10 ea Neuromuscular Re-ed Wall ball circles 500g CW/CCW x30 ea Wall walks YTB around hands up/down/lat to fatigue Manual Therapy STM & TPR R UT and levator scap x8 mins pt positioned in supine     PATIENT EDUCATION: Education details: Pt received education regarding HEP performance, ADL performance, functional activity tolerance, impairment education, appropriate performance of therapeutic activities. Person educated: Patient Education method: Explanation, Demonstration, Tactile cues, Verbal cues, and Handouts Education comprehension: verbalized understanding and returned demonstration  HOME EXERCISE PROGRAM: Access Code: [email or web address] URL: [email or web address] Date: 01/27/2024 Prepared by: Gellen April Earnie Starring  Exercises - Standing Shoulder Row with Anchored Resistance  - 1 x daily - 7 x weekly - 2 sets - 10 reps - Shoulder extension with resistance - Neutral  -  1 x daily - 7 x weekly - 2 sets - 10 reps - Shoulder External Rotation and Scapular Retraction  - 1 x daily - 7 x weekly - 2 sets - 10 reps - Shoulder External Rotation in 45 Degrees Abduction  - 1 x daily - 7 x weekly - 2 sets - 10 reps - Standing Shoulder Abduction Slides at Wall  - 1 x daily - 7 x weekly - 1 sets - 10 reps - Standing Single Shoulder Flexion Wall Slide with Palm Up (Mirrored)  - 1 x daily - 7 x weekly - 1 sets - 10 reps ---------------------------------------------------------------------------------------------  ASSESSMENT:  CLINICAL IMPRESSION: Patient had good tolerance of exercises today. Improved strength and confidence with OH reaching.  Continues to have difficulty with ext and horizontal abd but does report this is slowly improving. Patient continues to benefit from skilled PT services and should be progressed as able to improve functional  independence.  Eval impression (12/01/2023): Pt. attended today's physical therapy session for evaluation of R TSA. Pt had a R TSA on 11/03/2023, putting her 4 weeks out as of today 12/01/2023. Pt has notable deficits with shoulder mobility, strength, stability, and pain levels. Pt would benefit from therapeutic focus on P/AAROM and periscapular strengthening in line with surgical protocol. Protocol is written out on the 2nd page of the 11/23/2023 referral seen in the media tab on [employer].  Treatment performed today focused on pt education detailed in the objective. Pt demonstrated great understanding of education provided. required minimal v/t cues and no assistance for appropriate performance with today's activities.  Pts next protocol stage will be s/p 6 wks on 12/15/2023. Pt requires the intervention of skilled outpatient physical therapy to address the aforementioned deficits and progress towards a functional level in line with therapeutic goals.    OBJECTIVE IMPAIRMENTS: decreased mobility, decreased strength, hypomobility, impaired UE functional use, improper body mechanics, postural dysfunction, and pain.   ACTIVITY LIMITATIONS: lifting, sleeping, bed mobility, bathing, toileting, dressing, and reach over head  PARTICIPATION LIMITATIONS: cleaning, laundry, driving, shopping, and community activity  PERSONAL FACTORS: Age, Fitness, Past/current experiences, Time since onset of injury/illness/exacerbation, and 1-2 comorbidities: DM2 are also affecting patient's functional outcome.   REHAB POTENTIAL: Good  CLINICAL DECISION MAKING: Stable/uncomplicated  EVALUATION COMPLEXITY: Low  GOALS: Goals reviewed with patient? Yes  SHORT TERM GOALS: Target date: 12/29/2023  Pt will be independent with administered HEP to demonstrate the competency necessary for long term managemnet of symptoms at home.  Baseline: Goal status: MET Pt reports adherence 12/22/23   LONG TERM GOALS: Target date:  03/06/2024   Pt. Will achieve a DASH score of  40% as to demonstrate improvement in self-perceived functional ability with daily activities.  Baseline: ~80% 01/24/24: QuickDASH Score: 40.9 / 100 = 40.9 % Goal status: IN PROGRESS  2.  Pt will report pain levels improving during ADLs to be less than or equal to 2/10 as to demonstrate improved tolerance with daily functional activities such as bathing and dressing. Baseline: 8/10 01/24/24: at most 7/10  Goal status: IN PROGRESS  3.  Pt will improve MMT score for R global shoulder strengh to a 4+/5 to demonstrate improvement in strength for quality of motion and activity performance.  Baseline: deferred 01/24/24: 3-/5 Goal status: IN PROGRESS  4.  Pt will improve R shoulder flexion and abduction AROM to 160d to demonstrate volitional mobility neccessary for functional overhead mobility utilized during ADLs such as cleaning and washing. Baseline: see obj chart 01/24/24: shoulder  flexion 95, abd 87 in supine Goal status: IN PROGRESS  ---------------------------------------------------------------------------------------------  PLAN: PT FREQUENCY: 1-2x/week  PT DURATION: 6 weeks  PLANNED INTERVENTIONS: 97110-Therapeutic exercises, 97530- Therapeutic activity, [id number]- Neuromuscular re-education, 97535- Self Care, [id number]- Manual therapy, [id number]- Electrical stimulation (unattended), [id number]- Vasopneumatic device, 97033- Ionotophoresis 4mg /ml Dexamethasone , Patient/Family education, Taping, Joint mobilization, DME instructions, Cryotherapy, and Moist heat  PLAN FOR NEXT SESSION: Increase AROM and resistance as able.    Deangelo Berns E Laken Lobato PT 03/01/2024 11:53 AM

## 2024-03-06 ENCOUNTER — Ambulatory Visit

## 2024-03-06 NOTE — Therapy (Incomplete)
 OUTPATIENT PHYSICAL THERAPY NOTE   Patient Name: Norma [name] MRN: [id number] DOB:[birth year], 63 y.o., female Today's Date: 03/06/2024  END OF SESSION:       Past Medical History:  Diagnosis Date   A-fib (HCC)    Acid reflux    Allergy    Anemia    Anxiety    Arthritis    Back pain    Depression    Diabetes mellitus without complication (HCC)    on meds   Dysrhythmia    Fatty liver    Headache    stress headaches, migraines at times   Heart murmur    Hypertension    Irregular heart beat    Left shoulder pain    Shortness of breath dyspnea    with exertion   Tachyarrhythmia    Trigger point of thoracic region 03/22/2012   Past Surgical History:  Procedure Laterality Date   ANKLE SURGERY     CHOLECYSTECTOMY N/A 03/20/2020   Procedure: LAPAROSCOPIC CHOLECYSTECTOMY WITH INTRAOPERATIVE CHOLANGIOGRAM AND WEDGE LIVER BIOPSY;  Surgeon: Gladis Cough, MD;  Location: [facility];  Service: General;  Laterality: N/A;   KNEE SURGERY Left    LUMBAR LAMINECTOMY/DECOMPRESSION MICRODISCECTOMY Left 06/06/2014   Procedure: LUMBAR LAMINECTOMY/DECOMPRESSION MICRODISCECTOMY 1 LEVEL;  Surgeon: Reyes Budge, MD;  Location: [facility];  Service: Neurosurgery;  Laterality: Left;  Left L5S1 microdiskectomy   REVERSE SHOULDER ARTHROPLASTY Right 11/03/2023   Procedure: ARTHROPLASTY, SHOULDER, TOTAL, REVERSE;  Surgeon: Dozier Soulier, MD;  Location: [facility];  Service: Orthopedics;  Laterality: Right;   ROTATOR CUFF REPAIR Bilateral 02/20/2014   TUBAL LIGATION     Patient Active Problem List   Diagnosis Date Noted   Left foot pain 05/17/2023   Elevated serum creatinine 05/17/2023   Epidermal inclusion cyst 12/06/2022   Viral syndrome 04/05/2022   Generalized abdominal cramping 11/27/2021   Tendinopathy of right rotator cuff 04/04/2020   HCV antibody positive 03/18/2020   RUQ pain 03/17/2020   Biliary colic symptom    Elevated liver enzymes    Abnormal findings on diagnostic  imaging of liver and biliary tract    Abdominal pain 03/16/2020   Cholelithiasis 03/16/2020   Hypertensive urgency 03/16/2020   Type 2 diabetes mellitus (HCC) 03/16/2020   Lead exposure 11/14/2019   TMJ (temporomandibular joint disorder) 07/31/2019   Left hip pain 02/27/2019   Uterine mass 12/19/2018   Allergic rhinitis with postnasal drip 11/17/2018   Abdominal pain, right lower quadrant 06/23/2018   Diverticulosis of colon without hemorrhage 06/23/2018   Intractable migraine without aura and without status migrainosus 02/16/2018   Iron deficiency anemia 03/29/2017   Vitamin D  deficiency 03/01/2017   Lumbar herniated disc 06/06/2014   Depression    Spinal stenosis, lumbar region, with neurogenic claudication 11/21/2013   Hot flashes 06/30/2011   Eczema, dyshidrotic 06/02/2010   Leg cramps 05/06/2010   FIBROIDS, UTERUS 10/24/2009   GERD 06/09/2009   Chronic pain syndrome 10/04/2008   Hepatitis C carrier (HCC) 03/21/2007   Obesity, Class II, BMI 35-39.9 06/16/2006   HYPERTENSION, BENIGN SYSTEMIC 06/16/2006    PCP: Nicholas Bar, MD  REFERRING PROVIDER: Dozier Soulier, MD  REFERRING DIAG:  Free Text Diagnosis  S/P RIGHT TSA    Rationale for Evaluation and Treatment: Rehabilitation  THERAPY DIAG:  No diagnosis found.  PERTINENT HISTORY: GERD, diverticulosis, DM2, HTN  WEIGHT BEARING RESTRICTIONS: No  FALLS:  Has patient fallen in last 6 months? No  LIVING ENVIRONMENT: Lives with: lives alone Lives in: House/apartment Stairs: No  Has following equipment at home: Single point cane  OCCUPATION: Not currently   PRECAUTIONS: Shoulder no lifting more than 5 lb, no IR behind back ---------------------------------------------------------------------------------------------  SUBJECTIVE:                                                                                                                                                                                       SUBJECTIVE STATEMENT: ***  Pt reports that her shoulder is doing well today.  Patient reports no pain and has been compliant with her HEP.  Eval statement 12/01/2023: had R TSA on 11/03/2023 Has difficulties with lifting, bathing, and ADLs such as making the bed. Currently 0/10 pain, minimal swelling around surgical site, deep aching pain referred from surgery site to midline of arm   Hand dominance: Right  RED FLAGS: None   PLOF: Independent  PATIENT GOALS: be able to lift the shoulder  NEXT MD VISIT: n/a ---------------------------------------------------------------------------------------------  OBJECTIVE:  Note: Objective measures were completed at Evaluation unless otherwise noted.  DIAGNOSTIC FINDINGS:  N/a  PATIENT SURVEYS :  Quick dash: ~80%  COGNITION: Overall cognitive status: Within functional limits for tasks assessed     SENSATION: WFL  POSTURE: Forward head, increased kyphosis  UPPER EXTREMITY ROM:   ROM Right eval Left eval Right 01/04/24 Right 02/14/24  Shoulder flexion P: 80d WFL A: 95 sup A: 118 sit  Shoulder extension  WFL    Shoulder abduction P: 80d WFL A: 87 sup A: 102 sit  Shoulder adduction      Shoulder internal rotation P: 70d WFL    Shoulder external rotation P: 10d WFL A: 25 sitting A: 42 sit  Elbow flexion      Elbow extension      Wrist flexion      Wrist extension      Wrist ulnar deviation      Wrist radial deviation      Wrist pronation      Wrist supination      (Blank rows = not tested)  UPPER EXTREMITY FFU:[id number] d/t pain and recent surgery  MMT Right eval Left eval  Shoulder flexion    Shoulder extension    Shoulder abduction    Shoulder adduction    Shoulder internal rotation    Shoulder external rotation    Middle trapezius    Lower trapezius    Elbow flexion    Elbow extension    Wrist flexion    Wrist extension    Wrist ulnar deviation    Wrist radial deviation    Wrist pronation    Wrist  supination    Grip strength (lbs)    (Blank  rows = not tested)  SHOULDER SPECIAL TESTS: deferred  JOINT MOBILITY TESTING:  deferred  PALPATION:  Increased sensitivity around surgical site  TODAY'S TREATMENT:  [facility] Adult PT Treatment:                                                DATE: 03/06/24 Therapeutic Exercise: Standing shoulder flexion #1 back against wall 2x10 Standing shoulder abduction #1 back against wall 2x10 Dowel IR stretch 10 hold x10 Seated ITYs x10 ea Neuromuscular Re-ed Wall ball circles 500g CW/CCW x30 ea Wall walks YTB around hands up/down/lat to fatigue Seated BIL ER with scap retraction RTB 2x10 Seated horizontal abduction RTB 2x10 Standing rows GTB 2x10 Standing shoulder extension GTB 2x10  [facility] Adult PT Treatment:                                                DATE: 03/01/2024  Therapeutic Exercise: Scap retraction Supine flexion with pball - 2x10 Seated BIL ER with scap retraction RTB 3x10 Seated horizontal abduction RTB 2x10 Standing rows GTB 2x10 Standing shoulder extension RTB 2x10  Therapeutic Activity  Flexion ball taps - 2x to fatigue - 500g Wall walks around hands up/down/lat to fatigue Wall ball circles 500g CW/CCW x30 ea Standing shoulder flexion #1 back against wall 2x10 Standing shoulder abduction 1# back against wall 2x10     [facility] Adult PT Treatment:                                                DATE: 02/23/24 Therapeutic Exercise: Standing shoulder flexion #1 back against wall 2x10 Dowel IR stretch 10 hold x10 Seated ITYs x10 ea Neuromuscular Re-ed Wall ball circles 500g CW/CCW x30 ea Wall walks YTB around hands up/down/lat to fatigue Seated BIL ER with scap retraction RTB 2x10 Seated horizontal abduction RTB 2x10 Standing rows GTB 2x10 Standing shoulder extension GTB 2x10 Manual Therapy STM & TPR R UT and levator scap x10 mins pt positioned in supine   PATIENT EDUCATION: Education details: Pt received  education regarding HEP performance, ADL performance, functional activity tolerance, impairment education, appropriate performance of therapeutic activities. Person educated: Patient Education method: Explanation, Demonstration, Tactile cues, Verbal cues, and Handouts Education comprehension: verbalized understanding and returned demonstration  HOME EXERCISE PROGRAM: Access Code: [email or web address] URL: [email or web address] Date: 01/27/2024 Prepared by: Gellen April Earnie Starring  Exercises - Standing Shoulder Row with Anchored Resistance  - 1 x daily - 7 x weekly - 2 sets - 10 reps - Shoulder extension with resistance - Neutral  - 1 x daily - 7 x weekly - 2 sets - 10 reps - Shoulder External Rotation and Scapular Retraction  - 1 x daily - 7 x weekly - 2 sets - 10 reps - Shoulder External Rotation in 45 Degrees Abduction  - 1 x daily - 7 x weekly - 2 sets - 10 reps - Standing Shoulder Abduction Slides at Wall  - 1 x daily - 7 x weekly - 1 sets - 10 reps - Standing Single Shoulder Flexion Wall Slide with Palm Up (Mirrored)  - 1 x daily -  7 x weekly - 1 sets - 10 reps ---------------------------------------------------------------------------------------------  ASSESSMENT:  CLINICAL IMPRESSION: ***  Patient had good tolerance of exercises today. Improved strength and confidence with OH reaching.  Continues to have difficulty with ext and horizontal abd but does report this is slowly improving. Patient continues to benefit from skilled PT services and should be progressed as able to improve functional independence.  Eval impression (12/01/2023): Pt. attended today's physical therapy session for evaluation of R TSA. Pt had a R TSA on 11/03/2023, putting her 4 weeks out as of today 12/01/2023. Pt has notable deficits with shoulder mobility, strength, stability, and pain levels. Pt would benefit from therapeutic focus on P/AAROM and periscapular strengthening in line with surgical protocol.  Protocol is written out on the 2nd page of the 11/23/2023 referral seen in the media tab on [employer].  Treatment performed today focused on pt education detailed in the objective. Pt demonstrated great understanding of education provided. required minimal v/t cues and no assistance for appropriate performance with today's activities.  Pts next protocol stage will be s/p 6 wks on 12/15/2023. Pt requires the intervention of skilled outpatient physical therapy to address the aforementioned deficits and progress towards a functional level in line with therapeutic goals.    OBJECTIVE IMPAIRMENTS: decreased mobility, decreased strength, hypomobility, impaired UE functional use, improper body mechanics, postural dysfunction, and pain.   ACTIVITY LIMITATIONS: lifting, sleeping, bed mobility, bathing, toileting, dressing, and reach over head  PARTICIPATION LIMITATIONS: cleaning, laundry, driving, shopping, and community activity  PERSONAL FACTORS: Age, Fitness, Past/current experiences, Time since onset of injury/illness/exacerbation, and 1-2 comorbidities: DM2 are also affecting patient's functional outcome.   REHAB POTENTIAL: Good  CLINICAL DECISION MAKING: Stable/uncomplicated  EVALUATION COMPLEXITY: Low  GOALS: Goals reviewed with patient? Yes  SHORT TERM GOALS: Target date: 12/29/2023  Pt will be independent with administered HEP to demonstrate the competency necessary for long term managemnet of symptoms at home.  Baseline: Goal status: MET Pt reports adherence 12/22/23   LONG TERM GOALS: Target date: 03/06/2024   Pt. Will achieve a DASH score of  40% as to demonstrate improvement in self-perceived functional ability with daily activities.  Baseline: ~80% 01/24/24: QuickDASH Score: 40.9 / 100 = 40.9 % Goal status: IN PROGRESS  2.  Pt will report pain levels improving during ADLs to be less than or equal to 2/10 as to demonstrate improved tolerance with daily functional activities such as  bathing and dressing. Baseline: 8/10 01/24/24: at most 7/10  Goal status: IN PROGRESS  3.  Pt will improve MMT score for R global shoulder strengh to a 4+/5 to demonstrate improvement in strength for quality of motion and activity performance.  Baseline: deferred 01/24/24: 3-/5 Goal status: IN PROGRESS  4.  Pt will improve R shoulder flexion and abduction AROM to 160d to demonstrate volitional mobility neccessary for functional overhead mobility utilized during ADLs such as cleaning and washing. Baseline: see obj chart 01/24/24: shoulder flexion 95, abd 87 in supine Goal status: IN PROGRESS  ---------------------------------------------------------------------------------------------  PLAN: PT FREQUENCY: 1-2x/week  PT DURATION: 6 weeks  PLANNED INTERVENTIONS: 97110-Therapeutic exercises, 97530- Therapeutic activity, [id number]- Neuromuscular re-education, 97535- Self Care, [id number]- Manual therapy, G0283- Electrical stimulation (unattended), 97016- Vasopneumatic device, [id number]- Ionotophoresis 4mg /ml Dexamethasone , Patient/Family education, Taping, Joint mobilization, DME instructions, Cryotherapy, and Moist heat  PLAN FOR NEXT SESSION: Increase AROM and resistance as able.    Malakhai Beitler, SPTA 03/06/2024 8:14 AM

## 2024-03-08 ENCOUNTER — Ambulatory Visit

## 2024-03-08 NOTE — Therapy (Incomplete)
 OUTPATIENT PHYSICAL THERAPY NOTE   Patient Name: Norma [name] MRN: [id number] DOB:02-21-1961, 63 y.o., female Today's Date: 03/08/2024  END OF SESSION:       Past Medical History:  Diagnosis Date   A-fib (HCC)    Acid reflux    Allergy    Anemia    Anxiety    Arthritis    Back pain    Depression    Diabetes mellitus without complication (HCC)    on meds   Dysrhythmia    Fatty liver    Headache    stress headaches, migraines at times   Heart murmur    Hypertension    Irregular heart beat    Left shoulder pain    Shortness of breath dyspnea    with exertion   Tachyarrhythmia    Trigger point of thoracic region 03/22/2012   Past Surgical History:  Procedure Laterality Date   ANKLE SURGERY     CHOLECYSTECTOMY N/A 03/20/2020   Procedure: LAPAROSCOPIC CHOLECYSTECTOMY WITH INTRAOPERATIVE CHOLANGIOGRAM AND WEDGE LIVER BIOPSY;  Surgeon: Gladis Cough, MD;  Location: [facility];  Service: General;  Laterality: N/A;   KNEE SURGERY Left    LUMBAR LAMINECTOMY/DECOMPRESSION MICRODISCECTOMY Left 06/06/2014   Procedure: LUMBAR LAMINECTOMY/DECOMPRESSION MICRODISCECTOMY 1 LEVEL;  Surgeon: Reyes Budge, MD;  Location: [facility];  Service: Neurosurgery;  Laterality: Left;  Left L5S1 microdiskectomy   REVERSE SHOULDER ARTHROPLASTY Right 11/03/2023   Procedure: ARTHROPLASTY, SHOULDER, TOTAL, REVERSE;  Surgeon: Dozier Soulier, MD;  Location: [facility];  Service: Orthopedics;  Laterality: Right;   ROTATOR CUFF REPAIR Bilateral 02/20/2014   TUBAL LIGATION     Patient Active Problem List   Diagnosis Date Noted   Left foot pain 05/17/2023   Elevated serum creatinine 05/17/2023   Epidermal inclusion cyst 12/06/2022   Viral syndrome 04/05/2022   Generalized abdominal cramping 11/27/2021   Tendinopathy of right rotator cuff 04/04/2020   HCV antibody positive 03/18/2020   RUQ pain 03/17/2020   Biliary colic symptom    Elevated liver enzymes    Abnormal findings on diagnostic  imaging of liver and biliary tract    Abdominal pain 03/16/2020   Cholelithiasis 03/16/2020   Hypertensive urgency 03/16/2020   Type 2 diabetes mellitus (HCC) 03/16/2020   Lead exposure 11/14/2019   TMJ (temporomandibular joint disorder) 07/31/2019   Left hip pain 02/27/2019   Uterine mass 12/19/2018   Allergic rhinitis with postnasal drip 11/17/2018   Abdominal pain, right lower quadrant 06/23/2018   Diverticulosis of colon without hemorrhage 06/23/2018   Intractable migraine without aura and without status migrainosus 02/16/2018   Iron deficiency anemia 03/29/2017   Vitamin D  deficiency 03/01/2017   Lumbar herniated disc 06/06/2014   Depression    Spinal stenosis, lumbar region, with neurogenic claudication 11/21/2013   Hot flashes 06/30/2011   Eczema, dyshidrotic 06/02/2010   Leg cramps 05/06/2010   FIBROIDS, UTERUS 10/24/2009   GERD 06/09/2009   Chronic pain syndrome 10/04/2008   Hepatitis C carrier (HCC) 03/21/2007   Obesity, Class II, BMI 35-39.9 06/16/2006   HYPERTENSION, BENIGN SYSTEMIC 06/16/2006    PCP: Nicholas Bar, MD  REFERRING PROVIDER: Dozier Soulier, MD  REFERRING DIAG:  Free Text Diagnosis  S/P RIGHT TSA    Rationale for Evaluation and Treatment: Rehabilitation  THERAPY DIAG:  No diagnosis found.  PERTINENT HISTORY: GERD, diverticulosis, DM2, HTN  WEIGHT BEARING RESTRICTIONS: No  FALLS:  Has patient fallen in last 6 months? No  LIVING ENVIRONMENT: Lives with: lives alone Lives in: House/apartment Stairs: No  Has following equipment at home: Single point cane  OCCUPATION: Not currently   PRECAUTIONS: Shoulder no lifting more than 5 lb, no IR behind back ---------------------------------------------------------------------------------------------  SUBJECTIVE:                                                                                                                                                                                       SUBJECTIVE STATEMENT: ***  Pt reports that her shoulder is doing well today.  Patient reports no pain and has been compliant with her HEP.  Eval statement 12/01/2023: had R TSA on 11/03/2023 Has difficulties with lifting, bathing, and ADLs such as making the bed. Currently 0/10 pain, minimal swelling around surgical site, deep aching pain referred from surgery site to midline of arm   Hand dominance: Right  RED FLAGS: None   PLOF: Independent  PATIENT GOALS: be able to lift the shoulder  NEXT MD VISIT: n/a ---------------------------------------------------------------------------------------------  OBJECTIVE:  Note: Objective measures were completed at Evaluation unless otherwise noted.  DIAGNOSTIC FINDINGS:  N/a  PATIENT SURVEYS :  Quick dash: ~80%  COGNITION: Overall cognitive status: Within functional limits for tasks assessed     SENSATION: WFL  POSTURE: Forward head, increased kyphosis  UPPER EXTREMITY ROM:   ROM Right eval Left eval Right 01/04/24 Right 02/14/24  Shoulder flexion P: 80d WFL A: 95 sup A: 118 sit  Shoulder extension  WFL    Shoulder abduction P: 80d WFL A: 87 sup A: 102 sit  Shoulder adduction      Shoulder internal rotation P: 70d WFL    Shoulder external rotation P: 10d WFL A: 25 sitting A: 42 sit  Elbow flexion      Elbow extension      Wrist flexion      Wrist extension      Wrist ulnar deviation      Wrist radial deviation      Wrist pronation      Wrist supination      (Blank rows = not tested)  UPPER EXTREMITY FFU:[id number] d/t pain and recent surgery  MMT Right eval Left eval  Shoulder flexion    Shoulder extension    Shoulder abduction    Shoulder adduction    Shoulder internal rotation    Shoulder external rotation    Middle trapezius    Lower trapezius    Elbow flexion    Elbow extension    Wrist flexion    Wrist extension    Wrist ulnar deviation    Wrist radial deviation    Wrist pronation    Wrist  supination    Grip strength (lbs)    (Blank  rows = not tested)  SHOULDER SPECIAL TESTS: deferred  JOINT MOBILITY TESTING:  deferred  PALPATION:  Increased sensitivity around surgical site  TODAY'S TREATMENT:  [facility] Adult PT Treatment:                                                DATE: 03/08/24 Therapeutic Exercise: Standing shoulder flexion #1 back against wall 2x10 Standing shoulder abduction #1 back against wall 2x10 Dowel IR stretch 10 hold x10 Seated ITYs x10 ea Neuromuscular Re-ed Wall ball circles 500g CW/CCW x30 ea Wall walks YTB around hands up/down/lat to fatigue Seated BIL ER with scap retraction RTB 2x10 Seated horizontal abduction RTB 2x10 Standing rows GTB 2x10 Standing shoulder extension GTB 2x10  [facility] Adult PT Treatment:                                                DATE: 03/01/2024  Therapeutic Exercise: Scap retraction Supine flexion with pball - 2x10 Seated BIL ER with scap retraction RTB 3x10 Seated horizontal abduction RTB 2x10 Standing rows GTB 2x10 Standing shoulder extension RTB 2x10  Therapeutic Activity  Flexion ball taps - 2x to fatigue - 500g Wall walks around hands up/down/lat to fatigue Wall ball circles 500g CW/CCW x30 ea Standing shoulder flexion #1 back against wall 2x10 Standing shoulder abduction 1# back against wall 2x10     [facility] Adult PT Treatment:                                                DATE: 02/23/24 Therapeutic Exercise: Standing shoulder flexion #1 back against wall 2x10 Dowel IR stretch 10 hold x10 Seated ITYs x10 ea Neuromuscular Re-ed Wall ball circles 500g CW/CCW x30 ea Wall walks YTB around hands up/down/lat to fatigue Seated BIL ER with scap retraction RTB 2x10 Seated horizontal abduction RTB 2x10 Standing rows GTB 2x10 Standing shoulder extension GTB 2x10 Manual Therapy STM & TPR R UT and levator scap x10 mins pt positioned in supine   PATIENT EDUCATION: Education details: Pt received  education regarding HEP performance, ADL performance, functional activity tolerance, impairment education, appropriate performance of therapeutic activities. Person educated: Patient Education method: Explanation, Demonstration, Tactile cues, Verbal cues, and Handouts Education comprehension: verbalized understanding and returned demonstration  HOME EXERCISE PROGRAM: Access Code: [email or web address] URL: [email or web address] Date: 01/27/2024 Prepared by: Gellen April Earnie Starring  Exercises - Standing Shoulder Row with Anchored Resistance  - 1 x daily - 7 x weekly - 2 sets - 10 reps - Shoulder extension with resistance - Neutral  - 1 x daily - 7 x weekly - 2 sets - 10 reps - Shoulder External Rotation and Scapular Retraction  - 1 x daily - 7 x weekly - 2 sets - 10 reps - Shoulder External Rotation in 45 Degrees Abduction  - 1 x daily - 7 x weekly - 2 sets - 10 reps - Standing Shoulder Abduction Slides at Wall  - 1 x daily - 7 x weekly - 1 sets - 10 reps - Standing Single Shoulder Flexion Wall Slide with Palm Up (Mirrored)  - 1 x daily -  7 x weekly - 1 sets - 10 reps ---------------------------------------------------------------------------------------------  ASSESSMENT:  CLINICAL IMPRESSION: ***  Patient had good tolerance of exercises today. Improved strength and confidence with OH reaching.  Continues to have difficulty with ext and horizontal abd but does report this is slowly improving. Patient continues to benefit from skilled PT services and should be progressed as able to improve functional independence.  Eval impression (12/01/2023): Pt. attended today's physical therapy session for evaluation of R TSA. Pt had a R TSA on 11/03/2023, putting her 4 weeks out as of today 12/01/2023. Pt has notable deficits with shoulder mobility, strength, stability, and pain levels. Pt would benefit from therapeutic focus on P/AAROM and periscapular strengthening in line with surgical protocol.  Protocol is written out on the 2nd page of the 11/23/2023 referral seen in the media tab on [employer].  Treatment performed today focused on pt education detailed in the objective. Pt demonstrated great understanding of education provided. required minimal v/t cues and no assistance for appropriate performance with today's activities.  Pts next protocol stage will be s/p 6 wks on 12/15/2023. Pt requires the intervention of skilled outpatient physical therapy to address the aforementioned deficits and progress towards a functional level in line with therapeutic goals.    OBJECTIVE IMPAIRMENTS: decreased mobility, decreased strength, hypomobility, impaired UE functional use, improper body mechanics, postural dysfunction, and pain.   ACTIVITY LIMITATIONS: lifting, sleeping, bed mobility, bathing, toileting, dressing, and reach over head  PARTICIPATION LIMITATIONS: cleaning, laundry, driving, shopping, and community activity  PERSONAL FACTORS: Age, Fitness, Past/current experiences, Time since onset of injury/illness/exacerbation, and 1-2 comorbidities: DM2 are also affecting patient's functional outcome.   REHAB POTENTIAL: Good  CLINICAL DECISION MAKING: Stable/uncomplicated  EVALUATION COMPLEXITY: Low  GOALS: Goals reviewed with patient? Yes  SHORT TERM GOALS: Target date: 12/29/2023  Pt will be independent with administered HEP to demonstrate the competency necessary for long term managemnet of symptoms at home.  Baseline: Goal status: MET Pt reports adherence 12/22/23   LONG TERM GOALS: Target date: 03/06/2024   Pt. Will achieve a DASH score of  40% as to demonstrate improvement in self-perceived functional ability with daily activities.  Baseline: ~80% 01/24/24: QuickDASH Score: 40.9 / 100 = 40.9 % Goal status: IN PROGRESS  2.  Pt will report pain levels improving during ADLs to be less than or equal to 2/10 as to demonstrate improved tolerance with daily functional activities such as  bathing and dressing. Baseline: 8/10 01/24/24: at most 7/10  Goal status: IN PROGRESS  3.  Pt will improve MMT score for R global shoulder strengh to a 4+/5 to demonstrate improvement in strength for quality of motion and activity performance.  Baseline: deferred 01/24/24: 3-/5 Goal status: IN PROGRESS  4.  Pt will improve R shoulder flexion and abduction AROM to 160d to demonstrate volitional mobility neccessary for functional overhead mobility utilized during ADLs such as cleaning and washing. Baseline: see obj chart 01/24/24: shoulder flexion 95, abd 87 in supine Goal status: IN PROGRESS  ---------------------------------------------------------------------------------------------  PLAN: PT FREQUENCY: 1-2x/week  PT DURATION: 6 weeks  PLANNED INTERVENTIONS: 97110-Therapeutic exercises, 97530- Therapeutic activity, [id number]- Neuromuscular re-education, 97535- Self Care, [id number]- Manual therapy, [id number]- Electrical stimulation (unattended), 97016- Vasopneumatic device, 97033- Ionotophoresis 4mg /ml Dexamethasone , Patient/Family education, Taping, Joint mobilization, DME instructions, Cryotherapy, and Moist heat  PLAN FOR NEXT SESSION: Increase AROM and resistance as able.    Eliora Nienhuis, SPTA 03/08/2024 8:11 AM

## 2024-03-12 ENCOUNTER — Ambulatory Visit

## 2024-03-13 ENCOUNTER — Ambulatory Visit

## 2024-03-20 ENCOUNTER — Ambulatory Visit

## 2024-03-20 DIAGNOSIS — M25511 Pain in right shoulder: Secondary | ICD-10-CM | POA: Diagnosis present

## 2024-03-20 DIAGNOSIS — M6281 Muscle weakness (generalized): Secondary | ICD-10-CM | POA: Insufficient documentation

## 2024-03-20 DIAGNOSIS — M25611 Stiffness of right shoulder, not elsewhere classified: Secondary | ICD-10-CM | POA: Diagnosis present

## 2024-03-20 NOTE — Therapy (Signed)
 OUTPATIENT PHYSICAL THERAPY NOTE  DISCHARGE SUMMARY   Patient Name: Norma [name] MRN: [id number] DOB:1960/06/25, 63 y.o., female Today's Date: 03/20/2024  PHYSICAL THERAPY DISCHARGE SUMMARY  Visits from Start of Care: 20   Current functional level related to goals / functional outcomes: See objective findings/assessment    Remaining deficits: See objective findings/assessment    Education / Equipment: See today's treatment/assessment      Patient agrees to discharge. Patient goals were {OP Goals:25702::met}. Patient is being discharged due to {OP Discharge Reasons:25703::meeting the stated rehab goals.}    END OF SESSION:  PT End of Session - 03/20/24 1224     Visit Number 20    Number of Visits 22    Date for Recertification  03/06/24    Authorization Type [employer] MCR/MCD    PT Start Time 1220    PT Stop Time 1255    PT Time Calculation (min) 35 min    Activity Tolerance Patient tolerated treatment well    Behavior During Therapy WFL for tasks assessed/performed              Past Medical History:  Diagnosis Date   A-fib (HCC)    Acid reflux    Allergy    Anemia    Anxiety    Arthritis    Back pain    Depression    Diabetes mellitus without complication (HCC)    on meds   Dysrhythmia    Fatty liver    Headache    stress headaches, migraines at times   Heart murmur    Hypertension    Irregular heart beat    Left shoulder pain    Shortness of breath dyspnea    with exertion   Tachyarrhythmia    Trigger point of thoracic region 03/22/2012   Past Surgical History:  Procedure Laterality Date   ANKLE SURGERY     CHOLECYSTECTOMY N/A 03/20/2020   Procedure: LAPAROSCOPIC CHOLECYSTECTOMY WITH INTRAOPERATIVE CHOLANGIOGRAM AND WEDGE LIVER BIOPSY;  Surgeon: Gladis Cough, MD;  Location: [facility];  Service: General;  Laterality: N/A;   KNEE SURGERY Left    LUMBAR LAMINECTOMY/DECOMPRESSION MICRODISCECTOMY Left 06/06/2014   Procedure: LUMBAR  LAMINECTOMY/DECOMPRESSION MICRODISCECTOMY 1 LEVEL;  Surgeon: Reyes Budge, MD;  Location: [facility];  Service: Neurosurgery;  Laterality: Left;  Left L5S1 microdiskectomy   REVERSE SHOULDER ARTHROPLASTY Right 11/03/2023   Procedure: ARTHROPLASTY, SHOULDER, TOTAL, REVERSE;  Surgeon: Dozier Soulier, MD;  Location: [facility];  Service: Orthopedics;  Laterality: Right;   ROTATOR CUFF REPAIR Bilateral 02/20/2014   TUBAL LIGATION     Patient Active Problem List   Diagnosis Date Noted   Left foot pain 05/17/2023   Elevated serum creatinine 05/17/2023   Epidermal inclusion cyst 12/06/2022   Viral syndrome 04/05/2022   Generalized abdominal cramping 11/27/2021   Tendinopathy of right rotator cuff 04/04/2020   HCV antibody positive 03/18/2020   RUQ pain 03/17/2020   Biliary colic symptom    Elevated liver enzymes    Abnormal findings on diagnostic imaging of liver and biliary tract    Abdominal pain 03/16/2020   Cholelithiasis 03/16/2020   Hypertensive urgency 03/16/2020   Type 2 diabetes mellitus (HCC) 03/16/2020   Lead exposure 11/14/2019   TMJ (temporomandibular joint disorder) 07/31/2019   Left hip pain 02/27/2019   Uterine mass 12/19/2018   Allergic rhinitis with postnasal drip 11/17/2018   Abdominal pain, right lower quadrant 06/23/2018   Diverticulosis of colon without hemorrhage 06/23/2018   Intractable migraine without aura and without  status migrainosus 02/16/2018   Iron deficiency anemia 03/29/2017   Vitamin D  deficiency 03/01/2017   Lumbar herniated disc 06/06/2014   Depression    Spinal stenosis, lumbar region, with neurogenic claudication 11/21/2013   Hot flashes 06/30/2011   Eczema, dyshidrotic 06/02/2010   Leg cramps 05/06/2010   FIBROIDS, UTERUS 10/24/2009   GERD 06/09/2009   Chronic pain syndrome 10/04/2008   Hepatitis C carrier (HCC) 03/21/2007   Obesity, Class II, BMI 35-39.9 06/16/2006   HYPERTENSION, BENIGN SYSTEMIC 06/16/2006    PCP: Nicholas Bar,  MD  REFERRING PROVIDER: Dozier Soulier, MD  REFERRING DIAG:  Free Text Diagnosis  S/P RIGHT TSA    Rationale for Evaluation and Treatment: Rehabilitation  THERAPY DIAG:  Right shoulder pain, unspecified chronicity  Stiffness of right shoulder, not elsewhere classified  Muscle weakness (generalized)  PERTINENT HISTORY: GERD, diverticulosis, DM2, HTN  WEIGHT BEARING RESTRICTIONS: No  FALLS:  Has patient fallen in last 6 months? No  LIVING ENVIRONMENT: Lives with: lives alone Lives in: House/apartment Stairs: No Has following equipment at home: Single point cane  OCCUPATION: Not currently   PRECAUTIONS: Shoulder no lifting more than 5 lb, no IR behind back ---------------------------------------------------------------------------------------------  SUBJECTIVE:                                                                                                                                                                                      SUBJECTIVE STATEMENT:  Patient reporting some increased pain in her shoulder that she attributes to the cold/rainy weather. She states that she would like to take a break from PT and plans to continue working on her HEP    Eval statement 12/01/2023: had R TSA on 11/03/2023 Has difficulties with lifting, bathing, and ADLs such as making the bed. Currently 0/10 pain, minimal swelling around surgical site, deep aching pain referred from surgery site to midline of arm   Hand dominance: Right  RED FLAGS: None   PLOF: Independent  PATIENT GOALS: be able to lift the shoulder  NEXT MD VISIT: n/a ---------------------------------------------------------------------------------------------  OBJECTIVE:  Note: Objective measures were completed at Evaluation unless otherwise noted.  DIAGNOSTIC FINDINGS:  N/a  PATIENT SURVEYS :  Quick dash: ~80% 03/20/24: 25%  COGNITION: Overall cognitive status: Within functional limits for  tasks assessed     SENSATION: WFL  POSTURE: Forward head, increased kyphosis  UPPER EXTREMITY ROM:   ROM Right eval Left eval Right 01/04/24 Right 02/14/24 Right 03/20/2024  Shoulder flexion P: 80d WFL A: 95 sup A: 118 sit 114 supine  Shoulder extension  WFL     Shoulder abduction P: 80d WFL A: 87 sup A: 102 sit 110 supine  Shoulder adduction       Shoulder internal rotation P: 70d WFL     Shoulder external rotation P: 10d WFL A: 25 sitting A: 42 sit 25 upune  Elbow flexion       Elbow extension       Wrist flexion       Wrist extension       Wrist ulnar deviation       Wrist radial deviation       Wrist pronation       Wrist supination       (Blank rows = not tested)  UPPER EXTREMITY [id number] d/t pain and recent surgery  MMT Right eval Left eval  Shoulder flexion    Shoulder extension    Shoulder abduction    Shoulder adduction    Shoulder internal rotation    Shoulder external rotation    Middle trapezius    Lower trapezius    Elbow flexion    Elbow extension    Wrist flexion    Wrist extension    Wrist ulnar deviation    Wrist radial deviation    Wrist pronation    Wrist supination    Grip strength (lbs)    (Blank rows = not tested)  SHOULDER SPECIAL TESTS: deferred  JOINT MOBILITY TESTING:  deferred  PALPATION:  Increased sensitivity around surgical site  TODAY'S TREATMENT:  [facility] Adult PT Treatment:                                                DATE: 03/20/2024  Therapeutic Activity:  Reassessment of objective measures and subjective assessment regarding progress towards established goals and updated plan for addressing remaining deficits and rehab goals.  UBE lvl 2 x fwd/8min back prior to ROM measurements    PATIENT EDUCATION: Education details: Pt received education regarding HEP performance, ADL performance, functional activity tolerance, impairment education, appropriate performance of therapeutic activities. Person educated:  Patient Education method: Explanation, Demonstration, Tactile cues, Verbal cues, and Handouts Education comprehension: verbalized understanding and returned demonstration  HOME EXERCISE PROGRAM: Access Code: [email or web address] URL: [email or web address] Date: 01/27/2024 Prepared by: Gellen April Earnie Starring  Exercises - Standing Shoulder Row with Anchored Resistance  - 1 x daily - 7 x weekly - 2 sets - 10 reps - Shoulder extension with resistance - Neutral  - 1 x daily - 7 x weekly - 2 sets - 10 reps - Shoulder External Rotation and Scapular Retraction  - 1 x daily - 7 x weekly - 2 sets - 10 reps - Shoulder External Rotation in 45 Degrees Abduction  - 1 x daily - 7 x weekly - 2 sets - 10 reps - Standing Shoulder Abduction Slides at Wall  - 1 x daily - 7 x weekly - 1 sets - 10 reps - Standing Single Shoulder Flexion Wall Slide with Palm Up (Mirrored)  - 1 x daily - 7 x weekly - 1 sets - 10 reps ---------------------------------------------------------------------------------------------  ASSESSMENT:  CLINICAL IMPRESSION: Nava has attended 20 total visits Patient has made good progress with ***, and have met *** goals. They continue to have limited ***, and should continue with prescribed home program. Patient will be discharged from skilled PT at this time and should follow up with referring provider as needed.    Patient had good tolerance of exercises today. Improved strength and  confidence with OH reaching.  Continues to have difficulty with ext and horizontal abd but does report this is slowly improving. Patient continues to benefit from skilled PT services and should be progressed as able to improve functional independence.  Eval impression (12/01/2023): Pt. attended today's physical therapy session for evaluation of R TSA. Pt had a R TSA on 11/03/2023, putting her 4 weeks out as of today 12/01/2023. Pt has notable deficits with shoulder mobility, strength, stability, and pain  levels. Pt would benefit from therapeutic focus on P/AAROM and periscapular strengthening in line with surgical protocol. Protocol is written out on the 2nd page of the 11/23/2023 referral seen in the media tab on [employer].  Treatment performed today focused on pt education detailed in the objective. Pt demonstrated great understanding of education provided. required minimal v/t cues and no assistance for appropriate performance with today's activities.  Pts next protocol stage will be s/p 6 wks on 12/15/2023. Pt requires the intervention of skilled outpatient physical therapy to address the aforementioned deficits and progress towards a functional level in line with therapeutic goals.    OBJECTIVE IMPAIRMENTS: decreased mobility, decreased strength, hypomobility, impaired UE functional use, improper body mechanics, postural dysfunction, and pain.   ACTIVITY LIMITATIONS: lifting, sleeping, bed mobility, bathing, toileting, dressing, and reach over head  PARTICIPATION LIMITATIONS: cleaning, laundry, driving, shopping, and community activity  PERSONAL FACTORS: Age, Fitness, Past/current experiences, Time since onset of injury/illness/exacerbation, and 1-2 comorbidities: DM2 are also affecting patient's functional outcome.   REHAB POTENTIAL: Good  CLINICAL DECISION MAKING: Stable/uncomplicated  EVALUATION COMPLEXITY: Low  GOALS: Goals reviewed with patient? Yes  SHORT TERM GOALS: Target date: 12/29/2023  Pt will be independent with administered HEP to demonstrate the competency necessary for long term managemnet of symptoms at home.  Baseline: Goal status: MET Pt reports adherence 12/22/23   LONG TERM GOALS: Target date: 03/06/2024   Pt. Will achieve a DASH score of  40% as to demonstrate improvement in self-perceived functional ability with daily activities.  Baseline: ~80% 01/24/24: QuickDASH Score: 40.9 / 100 = 40.9 % 03/20/24: 25% Goal status: MET  2.  Pt will report pain levels  improving during ADLs to be less than or equal to 2/10 as to demonstrate improved tolerance with daily functional activities such as bathing and dressing. Baseline: 8/10 01/24/24: at most 7/10  03/20/24: 6/10 Goal status: NOT MET  3.  Pt will improve MMT score for R global shoulder strengh to a 4+/5 to demonstrate improvement in strength for quality of motion and activity performance.  Baseline: deferred 01/24/24: 3- Goal status:   4.  Pt will improve R shoulder flexion and abduction AROM to 160d to demonstrate volitional mobility neccessary for functional overhead mobility utilized during ADLs such as cleaning and washing. Baseline: see obj chart 01/24/24: shoulder flexion 95, abd 87 in supine 03/20/24: 114 supine, 120 sitting  Goal status: NOT MET  ---------------------------------------------------------------------------------------------  PLAN: PT FREQUENCY: 1-2x/week  PT DURATION: 6 weeks  PLANNED INTERVENTIONS: 97110-Therapeutic exercises, 97530- Therapeutic activity, 97112- Neuromuscular re-education, 97535- Self Care, 02859- Manual therapy, [id number]- Electrical stimulation (unattended), 97016- Vasopneumatic device, [id number]- Ionotophoresis 4mg /ml Dexamethasone , Patient/Family education, Taping, Joint mobilization, DME instructions, Cryotherapy, and Moist heat     Marko Molt, PT, DPT  03/20/2024 12:41 PM

## 2024-03-22 ENCOUNTER — Ambulatory Visit

## 2024-03-26 ENCOUNTER — Ambulatory Visit

## 2024-03-27 ENCOUNTER — Other Ambulatory Visit: Payer: Self-pay | Admitting: Family Medicine

## 2024-03-27 DIAGNOSIS — M545 Low back pain, unspecified: Secondary | ICD-10-CM

## 2024-05-24 ENCOUNTER — Other Ambulatory Visit: Payer: Self-pay | Admitting: Family Medicine

## 2024-05-24 DIAGNOSIS — M545 Low back pain, unspecified: Secondary | ICD-10-CM

## 2024-10-22 ENCOUNTER — Encounter
